# Patient Record
Sex: Female | Born: 1945 | Race: White | Hispanic: No | Marital: Married | Smoking: Current every day smoker
Health system: Southern US, Community
[De-identification: ages and names within clinical notes are randomized; demographics above are authoritative.]

## PROBLEM LIST (undated history)

## (undated) DIAGNOSIS — R918 Other nonspecific abnormal finding of lung field: Secondary | ICD-10-CM

## (undated) DIAGNOSIS — I10 Essential (primary) hypertension: Secondary | ICD-10-CM

## (undated) DIAGNOSIS — H409 Unspecified glaucoma: Secondary | ICD-10-CM

## (undated) DIAGNOSIS — E785 Hyperlipidemia, unspecified: Secondary | ICD-10-CM

## (undated) DIAGNOSIS — J449 Chronic obstructive pulmonary disease, unspecified: Secondary | ICD-10-CM

## (undated) DIAGNOSIS — S42302A Unspecified fracture of shaft of humerus, left arm, initial encounter for closed fracture: Secondary | ICD-10-CM

## (undated) HISTORY — DX: Chronic obstructive pulmonary disease, unspecified: J44.9

## (undated) HISTORY — DX: Hyperlipidemia, unspecified: E78.5

## (undated) HISTORY — DX: Other nonspecific abnormal finding of lung field: R91.8

## (undated) HISTORY — PX: EYE SURGERY: SHX253

## (undated) HISTORY — DX: Unspecified fracture of shaft of humerus, left arm, initial encounter for closed fracture: S42.302A

## (undated) HISTORY — PX: CARPAL TUNNEL RELEASE: SHX101

## (undated) HISTORY — DX: Unspecified glaucoma: H40.9

## (undated) HISTORY — DX: Essential (primary) hypertension: I10

## (undated) HISTORY — PX: BACK SURGERY: SHX140

---

## 2005-04-09 ENCOUNTER — Encounter: Admission: RE | Admit: 2005-04-09 | Discharge: 2005-04-09 | Payer: Self-pay | Admitting: Family Medicine

## 2005-12-06 ENCOUNTER — Ambulatory Visit: Payer: Self-pay | Admitting: Otolaryngology

## 2005-12-16 ENCOUNTER — Ambulatory Visit: Payer: Self-pay | Admitting: Otolaryngology

## 2006-03-01 ENCOUNTER — Ambulatory Visit: Payer: Self-pay | Admitting: Specialist

## 2006-07-19 ENCOUNTER — Ambulatory Visit: Payer: Self-pay | Admitting: Ophthalmology

## 2007-11-07 ENCOUNTER — Ambulatory Visit: Payer: Self-pay | Admitting: Oral Surgery

## 2009-05-05 ENCOUNTER — Ambulatory Visit: Payer: Self-pay | Admitting: Unknown Physician Specialty

## 2009-05-22 ENCOUNTER — Ambulatory Visit: Payer: Self-pay | Admitting: Unknown Physician Specialty

## 2009-05-27 ENCOUNTER — Ambulatory Visit: Payer: Self-pay | Admitting: Unknown Physician Specialty

## 2009-06-16 ENCOUNTER — Inpatient Hospital Stay: Payer: Self-pay | Admitting: Unknown Physician Specialty

## 2009-07-03 ENCOUNTER — Emergency Department: Payer: Self-pay | Admitting: Emergency Medicine

## 2009-07-06 DIAGNOSIS — E7800 Pure hypercholesterolemia, unspecified: Secondary | ICD-10-CM | POA: Insufficient documentation

## 2009-07-06 DIAGNOSIS — H409 Unspecified glaucoma: Secondary | ICD-10-CM | POA: Insufficient documentation

## 2009-07-06 DIAGNOSIS — J449 Chronic obstructive pulmonary disease, unspecified: Secondary | ICD-10-CM | POA: Insufficient documentation

## 2009-07-06 DIAGNOSIS — R32 Unspecified urinary incontinence: Secondary | ICD-10-CM | POA: Insufficient documentation

## 2009-07-06 DIAGNOSIS — I471 Supraventricular tachycardia: Secondary | ICD-10-CM | POA: Insufficient documentation

## 2009-07-06 DIAGNOSIS — K219 Gastro-esophageal reflux disease without esophagitis: Secondary | ICD-10-CM | POA: Insufficient documentation

## 2009-07-06 DIAGNOSIS — I10 Essential (primary) hypertension: Secondary | ICD-10-CM | POA: Insufficient documentation

## 2010-02-03 ENCOUNTER — Ambulatory Visit: Payer: Self-pay | Admitting: Family Medicine

## 2010-02-10 ENCOUNTER — Ambulatory Visit: Payer: Self-pay | Admitting: Family Medicine

## 2010-02-10 DIAGNOSIS — J309 Allergic rhinitis, unspecified: Secondary | ICD-10-CM | POA: Insufficient documentation

## 2010-07-22 ENCOUNTER — Ambulatory Visit: Payer: Self-pay | Admitting: Family Medicine

## 2010-11-08 ENCOUNTER — Ambulatory Visit: Payer: Self-pay | Admitting: Unknown Physician Specialty

## 2010-11-14 ENCOUNTER — Encounter: Payer: Self-pay | Admitting: Family Medicine

## 2010-11-30 ENCOUNTER — Ambulatory Visit: Payer: Self-pay | Admitting: Pain Medicine

## 2010-12-13 ENCOUNTER — Ambulatory Visit: Payer: Self-pay | Admitting: Pain Medicine

## 2010-12-15 ENCOUNTER — Ambulatory Visit: Payer: Self-pay | Admitting: Pain Medicine

## 2010-12-15 ENCOUNTER — Other Ambulatory Visit: Payer: Self-pay | Admitting: Pain Medicine

## 2011-01-11 ENCOUNTER — Ambulatory Visit: Payer: Self-pay | Admitting: Pain Medicine

## 2011-01-25 ENCOUNTER — Ambulatory Visit: Payer: Self-pay | Admitting: Pain Medicine

## 2011-02-09 ENCOUNTER — Ambulatory Visit: Payer: Self-pay | Admitting: Gastroenterology

## 2011-02-13 LAB — PATHOLOGY REPORT

## 2011-02-14 ENCOUNTER — Ambulatory Visit: Payer: Self-pay | Admitting: Pain Medicine

## 2011-04-20 ENCOUNTER — Ambulatory Visit: Payer: Self-pay | Admitting: Rheumatology

## 2011-11-15 DIAGNOSIS — E7800 Pure hypercholesterolemia, unspecified: Secondary | ICD-10-CM | POA: Diagnosis not present

## 2011-11-15 DIAGNOSIS — J449 Chronic obstructive pulmonary disease, unspecified: Secondary | ICD-10-CM | POA: Diagnosis not present

## 2011-11-15 DIAGNOSIS — M949 Disorder of cartilage, unspecified: Secondary | ICD-10-CM | POA: Diagnosis not present

## 2011-11-15 DIAGNOSIS — I10 Essential (primary) hypertension: Secondary | ICD-10-CM | POA: Diagnosis not present

## 2011-11-15 DIAGNOSIS — M899 Disorder of bone, unspecified: Secondary | ICD-10-CM | POA: Diagnosis not present

## 2011-12-05 DIAGNOSIS — M545 Low back pain, unspecified: Secondary | ICD-10-CM | POA: Diagnosis not present

## 2011-12-19 DIAGNOSIS — H4010X0 Unspecified open-angle glaucoma, stage unspecified: Secondary | ICD-10-CM | POA: Diagnosis not present

## 2012-01-18 DIAGNOSIS — S52209A Unspecified fracture of shaft of unspecified ulna, initial encounter for closed fracture: Secondary | ICD-10-CM | POA: Diagnosis not present

## 2012-01-18 DIAGNOSIS — S52309A Unspecified fracture of shaft of unspecified radius, initial encounter for closed fracture: Secondary | ICD-10-CM | POA: Diagnosis not present

## 2012-01-27 DIAGNOSIS — S52209A Unspecified fracture of shaft of unspecified ulna, initial encounter for closed fracture: Secondary | ICD-10-CM | POA: Diagnosis not present

## 2012-01-27 DIAGNOSIS — I10 Essential (primary) hypertension: Secondary | ICD-10-CM | POA: Diagnosis not present

## 2012-01-27 DIAGNOSIS — E7800 Pure hypercholesterolemia, unspecified: Secondary | ICD-10-CM | POA: Diagnosis not present

## 2012-02-06 DIAGNOSIS — S52309A Unspecified fracture of shaft of unspecified radius, initial encounter for closed fracture: Secondary | ICD-10-CM | POA: Diagnosis not present

## 2012-02-06 DIAGNOSIS — S52209A Unspecified fracture of shaft of unspecified ulna, initial encounter for closed fracture: Secondary | ICD-10-CM | POA: Diagnosis not present

## 2012-02-27 DIAGNOSIS — S52309A Unspecified fracture of shaft of unspecified radius, initial encounter for closed fracture: Secondary | ICD-10-CM | POA: Diagnosis not present

## 2012-02-27 DIAGNOSIS — S52209A Unspecified fracture of shaft of unspecified ulna, initial encounter for closed fracture: Secondary | ICD-10-CM | POA: Diagnosis not present

## 2012-03-12 DIAGNOSIS — S52209A Unspecified fracture of shaft of unspecified ulna, initial encounter for closed fracture: Secondary | ICD-10-CM | POA: Diagnosis not present

## 2012-03-12 DIAGNOSIS — S52309A Unspecified fracture of shaft of unspecified radius, initial encounter for closed fracture: Secondary | ICD-10-CM | POA: Diagnosis not present

## 2012-03-20 DIAGNOSIS — S52209A Unspecified fracture of shaft of unspecified ulna, initial encounter for closed fracture: Secondary | ICD-10-CM | POA: Diagnosis not present

## 2012-03-20 DIAGNOSIS — S52309A Unspecified fracture of shaft of unspecified radius, initial encounter for closed fracture: Secondary | ICD-10-CM | POA: Diagnosis not present

## 2012-03-22 DIAGNOSIS — H169 Unspecified keratitis: Secondary | ICD-10-CM | POA: Diagnosis not present

## 2012-03-29 DIAGNOSIS — H169 Unspecified keratitis: Secondary | ICD-10-CM | POA: Diagnosis not present

## 2012-04-16 DIAGNOSIS — I10 Essential (primary) hypertension: Secondary | ICD-10-CM | POA: Diagnosis not present

## 2012-04-16 DIAGNOSIS — M545 Low back pain, unspecified: Secondary | ICD-10-CM | POA: Diagnosis not present

## 2012-04-16 DIAGNOSIS — J449 Chronic obstructive pulmonary disease, unspecified: Secondary | ICD-10-CM | POA: Diagnosis not present

## 2012-04-16 DIAGNOSIS — E7800 Pure hypercholesterolemia, unspecified: Secondary | ICD-10-CM | POA: Diagnosis not present

## 2012-04-20 DIAGNOSIS — H4010X0 Unspecified open-angle glaucoma, stage unspecified: Secondary | ICD-10-CM | POA: Diagnosis not present

## 2012-04-30 DIAGNOSIS — S52309A Unspecified fracture of shaft of unspecified radius, initial encounter for closed fracture: Secondary | ICD-10-CM | POA: Diagnosis not present

## 2012-04-30 DIAGNOSIS — S52209A Unspecified fracture of shaft of unspecified ulna, initial encounter for closed fracture: Secondary | ICD-10-CM | POA: Diagnosis not present

## 2012-05-01 ENCOUNTER — Ambulatory Visit: Payer: Self-pay | Admitting: Pain Medicine

## 2012-05-01 DIAGNOSIS — M79609 Pain in unspecified limb: Secondary | ICD-10-CM | POA: Diagnosis not present

## 2012-05-01 DIAGNOSIS — M545 Low back pain, unspecified: Secondary | ICD-10-CM | POA: Diagnosis not present

## 2012-05-01 DIAGNOSIS — G4733 Obstructive sleep apnea (adult) (pediatric): Secondary | ICD-10-CM | POA: Diagnosis not present

## 2012-05-01 DIAGNOSIS — I10 Essential (primary) hypertension: Secondary | ICD-10-CM | POA: Diagnosis not present

## 2012-05-01 DIAGNOSIS — F17200 Nicotine dependence, unspecified, uncomplicated: Secondary | ICD-10-CM | POA: Diagnosis not present

## 2012-05-01 DIAGNOSIS — M5137 Other intervertebral disc degeneration, lumbosacral region: Secondary | ICD-10-CM | POA: Diagnosis not present

## 2012-05-01 DIAGNOSIS — Z7982 Long term (current) use of aspirin: Secondary | ICD-10-CM | POA: Diagnosis not present

## 2012-05-01 DIAGNOSIS — M25559 Pain in unspecified hip: Secondary | ICD-10-CM | POA: Diagnosis not present

## 2012-05-01 DIAGNOSIS — M169 Osteoarthritis of hip, unspecified: Secondary | ICD-10-CM | POA: Diagnosis not present

## 2012-05-01 DIAGNOSIS — K219 Gastro-esophageal reflux disease without esophagitis: Secondary | ICD-10-CM | POA: Diagnosis not present

## 2012-05-01 DIAGNOSIS — IMO0002 Reserved for concepts with insufficient information to code with codable children: Secondary | ICD-10-CM | POA: Diagnosis not present

## 2012-05-01 DIAGNOSIS — M47817 Spondylosis without myelopathy or radiculopathy, lumbosacral region: Secondary | ICD-10-CM | POA: Diagnosis not present

## 2012-05-01 DIAGNOSIS — Z79899 Other long term (current) drug therapy: Secondary | ICD-10-CM | POA: Diagnosis not present

## 2012-05-01 DIAGNOSIS — E669 Obesity, unspecified: Secondary | ICD-10-CM | POA: Diagnosis not present

## 2012-05-01 DIAGNOSIS — G5790 Unspecified mononeuropathy of unspecified lower limb: Secondary | ICD-10-CM | POA: Diagnosis not present

## 2012-05-01 DIAGNOSIS — I209 Angina pectoris, unspecified: Secondary | ICD-10-CM | POA: Diagnosis not present

## 2012-05-01 DIAGNOSIS — J449 Chronic obstructive pulmonary disease, unspecified: Secondary | ICD-10-CM | POA: Diagnosis not present

## 2012-05-01 DIAGNOSIS — I2510 Atherosclerotic heart disease of native coronary artery without angina pectoris: Secondary | ICD-10-CM | POA: Diagnosis not present

## 2012-05-01 DIAGNOSIS — IMO0001 Reserved for inherently not codable concepts without codable children: Secondary | ICD-10-CM | POA: Diagnosis not present

## 2012-05-08 ENCOUNTER — Ambulatory Visit: Payer: Self-pay | Admitting: Pain Medicine

## 2012-05-08 DIAGNOSIS — M47817 Spondylosis without myelopathy or radiculopathy, lumbosacral region: Secondary | ICD-10-CM | POA: Diagnosis not present

## 2012-05-08 DIAGNOSIS — IMO0002 Reserved for concepts with insufficient information to code with codable children: Secondary | ICD-10-CM | POA: Diagnosis not present

## 2012-05-09 ENCOUNTER — Other Ambulatory Visit: Payer: Self-pay | Admitting: Pain Medicine

## 2012-05-09 DIAGNOSIS — IMO0001 Reserved for inherently not codable concepts without codable children: Secondary | ICD-10-CM | POA: Diagnosis not present

## 2012-05-09 DIAGNOSIS — M79609 Pain in unspecified limb: Secondary | ICD-10-CM | POA: Diagnosis not present

## 2012-05-09 DIAGNOSIS — G609 Hereditary and idiopathic neuropathy, unspecified: Secondary | ICD-10-CM | POA: Diagnosis not present

## 2012-05-09 DIAGNOSIS — R52 Pain, unspecified: Secondary | ICD-10-CM | POA: Diagnosis not present

## 2012-05-09 DIAGNOSIS — M549 Dorsalgia, unspecified: Secondary | ICD-10-CM | POA: Diagnosis not present

## 2012-05-09 LAB — FOLATE: Folic Acid: 23.4 ng/mL (ref 3.1–100.0)

## 2012-05-24 DIAGNOSIS — F4542 Pain disorder with related psychological factors: Secondary | ICD-10-CM | POA: Diagnosis not present

## 2012-05-28 ENCOUNTER — Ambulatory Visit: Payer: Self-pay | Admitting: Pain Medicine

## 2012-05-28 DIAGNOSIS — M79609 Pain in unspecified limb: Secondary | ICD-10-CM | POA: Diagnosis not present

## 2012-05-28 DIAGNOSIS — IMO0002 Reserved for concepts with insufficient information to code with codable children: Secondary | ICD-10-CM | POA: Diagnosis not present

## 2012-05-28 DIAGNOSIS — M47817 Spondylosis without myelopathy or radiculopathy, lumbosacral region: Secondary | ICD-10-CM | POA: Diagnosis not present

## 2012-05-28 DIAGNOSIS — M545 Low back pain, unspecified: Secondary | ICD-10-CM | POA: Diagnosis not present

## 2012-05-28 DIAGNOSIS — M25559 Pain in unspecified hip: Secondary | ICD-10-CM | POA: Diagnosis not present

## 2012-05-28 DIAGNOSIS — M169 Osteoarthritis of hip, unspecified: Secondary | ICD-10-CM | POA: Diagnosis not present

## 2012-05-28 DIAGNOSIS — IMO0001 Reserved for inherently not codable concepts without codable children: Secondary | ICD-10-CM | POA: Diagnosis not present

## 2012-05-28 DIAGNOSIS — Z79899 Other long term (current) drug therapy: Secondary | ICD-10-CM | POA: Diagnosis not present

## 2012-05-28 DIAGNOSIS — G8929 Other chronic pain: Secondary | ICD-10-CM | POA: Diagnosis not present

## 2012-05-28 DIAGNOSIS — M961 Postlaminectomy syndrome, not elsewhere classified: Secondary | ICD-10-CM | POA: Diagnosis not present

## 2012-05-31 ENCOUNTER — Ambulatory Visit: Payer: Self-pay | Admitting: Pain Medicine

## 2012-05-31 DIAGNOSIS — M79609 Pain in unspecified limb: Secondary | ICD-10-CM | POA: Diagnosis not present

## 2012-05-31 DIAGNOSIS — M961 Postlaminectomy syndrome, not elsewhere classified: Secondary | ICD-10-CM | POA: Diagnosis not present

## 2012-05-31 DIAGNOSIS — IMO0002 Reserved for concepts with insufficient information to code with codable children: Secondary | ICD-10-CM | POA: Diagnosis not present

## 2012-07-02 ENCOUNTER — Ambulatory Visit: Payer: Self-pay | Admitting: Pain Medicine

## 2012-07-02 DIAGNOSIS — M47817 Spondylosis without myelopathy or radiculopathy, lumbosacral region: Secondary | ICD-10-CM | POA: Diagnosis not present

## 2012-07-02 DIAGNOSIS — S52309A Unspecified fracture of shaft of unspecified radius, initial encounter for closed fracture: Secondary | ICD-10-CM | POA: Diagnosis not present

## 2012-07-02 DIAGNOSIS — M79609 Pain in unspecified limb: Secondary | ICD-10-CM | POA: Diagnosis not present

## 2012-07-02 DIAGNOSIS — IMO0002 Reserved for concepts with insufficient information to code with codable children: Secondary | ICD-10-CM | POA: Diagnosis not present

## 2012-07-02 DIAGNOSIS — S52209A Unspecified fracture of shaft of unspecified ulna, initial encounter for closed fracture: Secondary | ICD-10-CM | POA: Diagnosis not present

## 2012-07-02 DIAGNOSIS — M4716 Other spondylosis with myelopathy, lumbar region: Secondary | ICD-10-CM | POA: Diagnosis not present

## 2012-07-02 DIAGNOSIS — M25579 Pain in unspecified ankle and joints of unspecified foot: Secondary | ICD-10-CM | POA: Diagnosis not present

## 2012-07-02 DIAGNOSIS — M545 Low back pain, unspecified: Secondary | ICD-10-CM | POA: Diagnosis not present

## 2012-07-02 DIAGNOSIS — M169 Osteoarthritis of hip, unspecified: Secondary | ICD-10-CM | POA: Diagnosis not present

## 2012-07-02 DIAGNOSIS — Z79899 Other long term (current) drug therapy: Secondary | ICD-10-CM | POA: Diagnosis not present

## 2012-07-02 DIAGNOSIS — IMO0001 Reserved for inherently not codable concepts without codable children: Secondary | ICD-10-CM | POA: Diagnosis not present

## 2012-07-02 DIAGNOSIS — M25559 Pain in unspecified hip: Secondary | ICD-10-CM | POA: Diagnosis not present

## 2012-07-02 DIAGNOSIS — S8990XA Unspecified injury of unspecified lower leg, initial encounter: Secondary | ICD-10-CM | POA: Diagnosis not present

## 2012-07-30 ENCOUNTER — Ambulatory Visit: Payer: Self-pay | Admitting: Pain Medicine

## 2012-07-30 DIAGNOSIS — G8929 Other chronic pain: Secondary | ICD-10-CM | POA: Diagnosis not present

## 2012-07-30 DIAGNOSIS — M961 Postlaminectomy syndrome, not elsewhere classified: Secondary | ICD-10-CM | POA: Diagnosis not present

## 2012-07-30 DIAGNOSIS — M79609 Pain in unspecified limb: Secondary | ICD-10-CM | POA: Diagnosis not present

## 2012-07-30 DIAGNOSIS — M47817 Spondylosis without myelopathy or radiculopathy, lumbosacral region: Secondary | ICD-10-CM | POA: Diagnosis not present

## 2012-07-30 DIAGNOSIS — M25569 Pain in unspecified knee: Secondary | ICD-10-CM | POA: Diagnosis not present

## 2012-07-30 DIAGNOSIS — Z79899 Other long term (current) drug therapy: Secondary | ICD-10-CM | POA: Diagnosis not present

## 2012-07-30 DIAGNOSIS — R109 Unspecified abdominal pain: Secondary | ICD-10-CM | POA: Diagnosis not present

## 2012-07-30 DIAGNOSIS — M545 Low back pain, unspecified: Secondary | ICD-10-CM | POA: Diagnosis not present

## 2012-07-30 DIAGNOSIS — IMO0002 Reserved for concepts with insufficient information to code with codable children: Secondary | ICD-10-CM | POA: Diagnosis not present

## 2012-07-30 DIAGNOSIS — M169 Osteoarthritis of hip, unspecified: Secondary | ICD-10-CM | POA: Diagnosis not present

## 2012-07-30 DIAGNOSIS — M4716 Other spondylosis with myelopathy, lumbar region: Secondary | ICD-10-CM | POA: Diagnosis not present

## 2012-07-30 DIAGNOSIS — IMO0001 Reserved for inherently not codable concepts without codable children: Secondary | ICD-10-CM | POA: Diagnosis not present

## 2012-08-27 ENCOUNTER — Ambulatory Visit: Payer: Self-pay | Admitting: Pain Medicine

## 2012-08-27 DIAGNOSIS — M169 Osteoarthritis of hip, unspecified: Secondary | ICD-10-CM | POA: Diagnosis not present

## 2012-08-27 DIAGNOSIS — M25559 Pain in unspecified hip: Secondary | ICD-10-CM | POA: Diagnosis not present

## 2012-08-27 DIAGNOSIS — G8929 Other chronic pain: Secondary | ICD-10-CM | POA: Diagnosis not present

## 2012-08-27 DIAGNOSIS — M545 Low back pain, unspecified: Secondary | ICD-10-CM | POA: Diagnosis not present

## 2012-08-27 DIAGNOSIS — R109 Unspecified abdominal pain: Secondary | ICD-10-CM | POA: Diagnosis not present

## 2012-08-27 DIAGNOSIS — Z79899 Other long term (current) drug therapy: Secondary | ICD-10-CM | POA: Diagnosis not present

## 2012-08-27 DIAGNOSIS — IMO0002 Reserved for concepts with insufficient information to code with codable children: Secondary | ICD-10-CM | POA: Diagnosis not present

## 2012-08-27 DIAGNOSIS — M47817 Spondylosis without myelopathy or radiculopathy, lumbosacral region: Secondary | ICD-10-CM | POA: Diagnosis not present

## 2012-08-27 DIAGNOSIS — M4716 Other spondylosis with myelopathy, lumbar region: Secondary | ICD-10-CM | POA: Diagnosis not present

## 2012-08-27 DIAGNOSIS — IMO0001 Reserved for inherently not codable concepts without codable children: Secondary | ICD-10-CM | POA: Diagnosis not present

## 2012-09-03 DIAGNOSIS — E7800 Pure hypercholesterolemia, unspecified: Secondary | ICD-10-CM | POA: Diagnosis not present

## 2012-09-03 DIAGNOSIS — G608 Other hereditary and idiopathic neuropathies: Secondary | ICD-10-CM | POA: Diagnosis not present

## 2012-09-03 DIAGNOSIS — Z113 Encounter for screening for infections with a predominantly sexual mode of transmission: Secondary | ICD-10-CM | POA: Diagnosis not present

## 2012-09-03 DIAGNOSIS — Z0000 Encounter for general adult medical examination without abnormal findings: Secondary | ICD-10-CM | POA: Diagnosis not present

## 2012-09-03 DIAGNOSIS — I10 Essential (primary) hypertension: Secondary | ICD-10-CM | POA: Diagnosis not present

## 2012-09-03 LAB — HM HEPATITIS C SCREENING LAB: HM Hepatitis Screen: NEGATIVE

## 2012-10-11 DIAGNOSIS — H4010X0 Unspecified open-angle glaucoma, stage unspecified: Secondary | ICD-10-CM | POA: Diagnosis not present

## 2012-10-31 DIAGNOSIS — M19049 Primary osteoarthritis, unspecified hand: Secondary | ICD-10-CM | POA: Diagnosis not present

## 2012-10-31 DIAGNOSIS — IMO0002 Reserved for concepts with insufficient information to code with codable children: Secondary | ICD-10-CM | POA: Diagnosis not present

## 2012-11-14 ENCOUNTER — Ambulatory Visit: Payer: Self-pay | Admitting: Pain Medicine

## 2012-11-14 DIAGNOSIS — IMO0001 Reserved for inherently not codable concepts without codable children: Secondary | ICD-10-CM | POA: Diagnosis not present

## 2012-11-14 DIAGNOSIS — F17200 Nicotine dependence, unspecified, uncomplicated: Secondary | ICD-10-CM | POA: Diagnosis not present

## 2012-11-14 DIAGNOSIS — I209 Angina pectoris, unspecified: Secondary | ICD-10-CM | POA: Diagnosis not present

## 2012-11-14 DIAGNOSIS — Z79899 Other long term (current) drug therapy: Secondary | ICD-10-CM | POA: Diagnosis not present

## 2012-11-14 DIAGNOSIS — G4733 Obstructive sleep apnea (adult) (pediatric): Secondary | ICD-10-CM | POA: Diagnosis not present

## 2012-11-14 DIAGNOSIS — M545 Low back pain, unspecified: Secondary | ICD-10-CM | POA: Diagnosis not present

## 2012-11-14 DIAGNOSIS — M961 Postlaminectomy syndrome, not elsewhere classified: Secondary | ICD-10-CM | POA: Diagnosis not present

## 2012-11-14 DIAGNOSIS — M47817 Spondylosis without myelopathy or radiculopathy, lumbosacral region: Secondary | ICD-10-CM | POA: Diagnosis not present

## 2012-11-14 DIAGNOSIS — I10 Essential (primary) hypertension: Secondary | ICD-10-CM | POA: Diagnosis not present

## 2012-11-14 DIAGNOSIS — M79609 Pain in unspecified limb: Secondary | ICD-10-CM | POA: Diagnosis not present

## 2012-11-14 DIAGNOSIS — G894 Chronic pain syndrome: Secondary | ICD-10-CM | POA: Diagnosis not present

## 2012-11-14 DIAGNOSIS — K219 Gastro-esophageal reflux disease without esophagitis: Secondary | ICD-10-CM | POA: Diagnosis not present

## 2012-11-14 DIAGNOSIS — M25559 Pain in unspecified hip: Secondary | ICD-10-CM | POA: Diagnosis not present

## 2012-11-14 DIAGNOSIS — IMO0002 Reserved for concepts with insufficient information to code with codable children: Secondary | ICD-10-CM | POA: Diagnosis not present

## 2012-11-14 DIAGNOSIS — M48061 Spinal stenosis, lumbar region without neurogenic claudication: Secondary | ICD-10-CM | POA: Diagnosis not present

## 2013-01-24 ENCOUNTER — Ambulatory Visit: Payer: Self-pay | Admitting: Pain Medicine

## 2013-01-24 DIAGNOSIS — G4733 Obstructive sleep apnea (adult) (pediatric): Secondary | ICD-10-CM | POA: Diagnosis not present

## 2013-01-24 DIAGNOSIS — M5137 Other intervertebral disc degeneration, lumbosacral region: Secondary | ICD-10-CM | POA: Diagnosis not present

## 2013-01-24 DIAGNOSIS — K219 Gastro-esophageal reflux disease without esophagitis: Secondary | ICD-10-CM | POA: Diagnosis not present

## 2013-01-24 DIAGNOSIS — F17200 Nicotine dependence, unspecified, uncomplicated: Secondary | ICD-10-CM | POA: Diagnosis not present

## 2013-01-24 DIAGNOSIS — I2510 Atherosclerotic heart disease of native coronary artery without angina pectoris: Secondary | ICD-10-CM | POA: Diagnosis not present

## 2013-01-24 DIAGNOSIS — I10 Essential (primary) hypertension: Secondary | ICD-10-CM | POA: Diagnosis not present

## 2013-01-24 DIAGNOSIS — M545 Low back pain, unspecified: Secondary | ICD-10-CM | POA: Diagnosis not present

## 2013-01-24 DIAGNOSIS — M961 Postlaminectomy syndrome, not elsewhere classified: Secondary | ICD-10-CM | POA: Diagnosis not present

## 2013-01-24 DIAGNOSIS — IMO0001 Reserved for inherently not codable concepts without codable children: Secondary | ICD-10-CM | POA: Diagnosis not present

## 2013-01-24 DIAGNOSIS — Z79899 Other long term (current) drug therapy: Secondary | ICD-10-CM | POA: Diagnosis not present

## 2013-01-24 DIAGNOSIS — M4716 Other spondylosis with myelopathy, lumbar region: Secondary | ICD-10-CM | POA: Diagnosis not present

## 2013-01-24 DIAGNOSIS — M47817 Spondylosis without myelopathy or radiculopathy, lumbosacral region: Secondary | ICD-10-CM | POA: Diagnosis not present

## 2013-01-24 DIAGNOSIS — M169 Osteoarthritis of hip, unspecified: Secondary | ICD-10-CM | POA: Diagnosis not present

## 2013-01-24 DIAGNOSIS — G8929 Other chronic pain: Secondary | ICD-10-CM | POA: Diagnosis not present

## 2013-01-24 DIAGNOSIS — J438 Other emphysema: Secondary | ICD-10-CM | POA: Diagnosis not present

## 2013-01-24 DIAGNOSIS — IMO0002 Reserved for concepts with insufficient information to code with codable children: Secondary | ICD-10-CM | POA: Diagnosis not present

## 2013-05-24 ENCOUNTER — Ambulatory Visit: Payer: Self-pay | Admitting: Pain Medicine

## 2013-05-24 DIAGNOSIS — M5137 Other intervertebral disc degeneration, lumbosacral region: Secondary | ICD-10-CM | POA: Diagnosis not present

## 2013-05-24 DIAGNOSIS — K219 Gastro-esophageal reflux disease without esophagitis: Secondary | ICD-10-CM | POA: Diagnosis not present

## 2013-05-24 DIAGNOSIS — Z79899 Other long term (current) drug therapy: Secondary | ICD-10-CM | POA: Diagnosis not present

## 2013-05-24 DIAGNOSIS — M47817 Spondylosis without myelopathy or radiculopathy, lumbosacral region: Secondary | ICD-10-CM | POA: Diagnosis not present

## 2013-05-24 DIAGNOSIS — J438 Other emphysema: Secondary | ICD-10-CM | POA: Diagnosis not present

## 2013-05-24 DIAGNOSIS — IMO0001 Reserved for inherently not codable concepts without codable children: Secondary | ICD-10-CM | POA: Diagnosis not present

## 2013-05-24 DIAGNOSIS — G4733 Obstructive sleep apnea (adult) (pediatric): Secondary | ICD-10-CM | POA: Diagnosis not present

## 2013-05-24 DIAGNOSIS — G8929 Other chronic pain: Secondary | ICD-10-CM | POA: Diagnosis not present

## 2013-05-24 DIAGNOSIS — M545 Low back pain, unspecified: Secondary | ICD-10-CM | POA: Diagnosis not present

## 2013-05-24 DIAGNOSIS — M169 Osteoarthritis of hip, unspecified: Secondary | ICD-10-CM | POA: Diagnosis not present

## 2013-05-24 DIAGNOSIS — E669 Obesity, unspecified: Secondary | ICD-10-CM | POA: Diagnosis not present

## 2013-05-24 DIAGNOSIS — I10 Essential (primary) hypertension: Secondary | ICD-10-CM | POA: Diagnosis not present

## 2013-05-24 DIAGNOSIS — IMO0002 Reserved for concepts with insufficient information to code with codable children: Secondary | ICD-10-CM | POA: Diagnosis not present

## 2013-05-24 DIAGNOSIS — G894 Chronic pain syndrome: Secondary | ICD-10-CM | POA: Diagnosis not present

## 2013-05-24 DIAGNOSIS — M4716 Other spondylosis with myelopathy, lumbar region: Secondary | ICD-10-CM | POA: Diagnosis not present

## 2013-05-24 DIAGNOSIS — I209 Angina pectoris, unspecified: Secondary | ICD-10-CM | POA: Diagnosis not present

## 2013-05-24 DIAGNOSIS — I2510 Atherosclerotic heart disease of native coronary artery without angina pectoris: Secondary | ICD-10-CM | POA: Diagnosis not present

## 2013-05-24 DIAGNOSIS — F17200 Nicotine dependence, unspecified, uncomplicated: Secondary | ICD-10-CM | POA: Diagnosis not present

## 2013-06-06 DIAGNOSIS — H4010X0 Unspecified open-angle glaucoma, stage unspecified: Secondary | ICD-10-CM | POA: Diagnosis not present

## 2013-07-24 ENCOUNTER — Ambulatory Visit: Payer: Self-pay | Admitting: Pain Medicine

## 2013-07-24 DIAGNOSIS — I2510 Atherosclerotic heart disease of native coronary artery without angina pectoris: Secondary | ICD-10-CM | POA: Diagnosis not present

## 2013-07-24 DIAGNOSIS — G8929 Other chronic pain: Secondary | ICD-10-CM | POA: Diagnosis not present

## 2013-07-24 DIAGNOSIS — M545 Low back pain, unspecified: Secondary | ICD-10-CM | POA: Diagnosis not present

## 2013-07-24 DIAGNOSIS — K219 Gastro-esophageal reflux disease without esophagitis: Secondary | ICD-10-CM | POA: Diagnosis not present

## 2013-07-24 DIAGNOSIS — M4716 Other spondylosis with myelopathy, lumbar region: Secondary | ICD-10-CM | POA: Diagnosis not present

## 2013-07-24 DIAGNOSIS — E669 Obesity, unspecified: Secondary | ICD-10-CM | POA: Diagnosis not present

## 2013-07-24 DIAGNOSIS — IMO0002 Reserved for concepts with insufficient information to code with codable children: Secondary | ICD-10-CM | POA: Diagnosis not present

## 2013-07-24 DIAGNOSIS — Z79899 Other long term (current) drug therapy: Secondary | ICD-10-CM | POA: Diagnosis not present

## 2013-07-24 DIAGNOSIS — IMO0001 Reserved for inherently not codable concepts without codable children: Secondary | ICD-10-CM | POA: Diagnosis not present

## 2013-07-24 DIAGNOSIS — F17200 Nicotine dependence, unspecified, uncomplicated: Secondary | ICD-10-CM | POA: Diagnosis not present

## 2013-07-24 DIAGNOSIS — M47817 Spondylosis without myelopathy or radiculopathy, lumbosacral region: Secondary | ICD-10-CM | POA: Diagnosis not present

## 2013-07-24 DIAGNOSIS — I209 Angina pectoris, unspecified: Secondary | ICD-10-CM | POA: Diagnosis not present

## 2013-07-24 DIAGNOSIS — I10 Essential (primary) hypertension: Secondary | ICD-10-CM | POA: Diagnosis not present

## 2013-07-24 DIAGNOSIS — M5137 Other intervertebral disc degeneration, lumbosacral region: Secondary | ICD-10-CM | POA: Diagnosis not present

## 2013-07-24 DIAGNOSIS — J438 Other emphysema: Secondary | ICD-10-CM | POA: Diagnosis not present

## 2013-07-24 DIAGNOSIS — M169 Osteoarthritis of hip, unspecified: Secondary | ICD-10-CM | POA: Diagnosis not present

## 2013-07-24 DIAGNOSIS — M961 Postlaminectomy syndrome, not elsewhere classified: Secondary | ICD-10-CM | POA: Diagnosis not present

## 2013-08-01 DIAGNOSIS — Z23 Encounter for immunization: Secondary | ICD-10-CM | POA: Diagnosis not present

## 2013-12-05 DIAGNOSIS — H4010X0 Unspecified open-angle glaucoma, stage unspecified: Secondary | ICD-10-CM | POA: Diagnosis not present

## 2013-12-13 ENCOUNTER — Ambulatory Visit: Payer: Self-pay | Admitting: Pain Medicine

## 2013-12-13 DIAGNOSIS — M545 Low back pain, unspecified: Secondary | ICD-10-CM | POA: Diagnosis not present

## 2013-12-13 DIAGNOSIS — Z79899 Other long term (current) drug therapy: Secondary | ICD-10-CM | POA: Diagnosis not present

## 2013-12-13 DIAGNOSIS — I209 Angina pectoris, unspecified: Secondary | ICD-10-CM | POA: Diagnosis not present

## 2013-12-13 DIAGNOSIS — Z5181 Encounter for therapeutic drug level monitoring: Secondary | ICD-10-CM | POA: Diagnosis not present

## 2013-12-13 DIAGNOSIS — K219 Gastro-esophageal reflux disease without esophagitis: Secondary | ICD-10-CM | POA: Diagnosis not present

## 2013-12-13 DIAGNOSIS — M47817 Spondylosis without myelopathy or radiculopathy, lumbosacral region: Secondary | ICD-10-CM | POA: Diagnosis not present

## 2013-12-13 DIAGNOSIS — F17200 Nicotine dependence, unspecified, uncomplicated: Secondary | ICD-10-CM | POA: Diagnosis not present

## 2013-12-13 DIAGNOSIS — M4716 Other spondylosis with myelopathy, lumbar region: Secondary | ICD-10-CM | POA: Diagnosis not present

## 2013-12-13 DIAGNOSIS — E669 Obesity, unspecified: Secondary | ICD-10-CM | POA: Diagnosis not present

## 2013-12-13 DIAGNOSIS — M1610 Unilateral primary osteoarthritis, unspecified hip: Secondary | ICD-10-CM | POA: Diagnosis not present

## 2013-12-13 DIAGNOSIS — IMO0002 Reserved for concepts with insufficient information to code with codable children: Secondary | ICD-10-CM | POA: Diagnosis not present

## 2013-12-13 DIAGNOSIS — M5137 Other intervertebral disc degeneration, lumbosacral region: Secondary | ICD-10-CM | POA: Diagnosis not present

## 2013-12-13 DIAGNOSIS — I2510 Atherosclerotic heart disease of native coronary artery without angina pectoris: Secondary | ICD-10-CM | POA: Diagnosis not present

## 2013-12-13 DIAGNOSIS — G894 Chronic pain syndrome: Secondary | ICD-10-CM | POA: Diagnosis not present

## 2013-12-13 DIAGNOSIS — M169 Osteoarthritis of hip, unspecified: Secondary | ICD-10-CM | POA: Diagnosis not present

## 2013-12-13 DIAGNOSIS — IMO0001 Reserved for inherently not codable concepts without codable children: Secondary | ICD-10-CM | POA: Diagnosis not present

## 2013-12-13 DIAGNOSIS — G8929 Other chronic pain: Secondary | ICD-10-CM | POA: Diagnosis not present

## 2013-12-13 DIAGNOSIS — M961 Postlaminectomy syndrome, not elsewhere classified: Secondary | ICD-10-CM | POA: Diagnosis not present

## 2013-12-13 DIAGNOSIS — I10 Essential (primary) hypertension: Secondary | ICD-10-CM | POA: Diagnosis not present

## 2013-12-13 DIAGNOSIS — J438 Other emphysema: Secondary | ICD-10-CM | POA: Diagnosis not present

## 2013-12-13 DIAGNOSIS — G4733 Obstructive sleep apnea (adult) (pediatric): Secondary | ICD-10-CM | POA: Diagnosis not present

## 2014-01-09 DIAGNOSIS — Z124 Encounter for screening for malignant neoplasm of cervix: Secondary | ICD-10-CM | POA: Diagnosis not present

## 2014-03-07 ENCOUNTER — Ambulatory Visit: Payer: Self-pay | Admitting: Pain Medicine

## 2014-03-07 DIAGNOSIS — M545 Low back pain, unspecified: Secondary | ICD-10-CM | POA: Diagnosis not present

## 2014-03-07 DIAGNOSIS — IMO0002 Reserved for concepts with insufficient information to code with codable children: Secondary | ICD-10-CM | POA: Diagnosis not present

## 2014-03-07 DIAGNOSIS — Z79899 Other long term (current) drug therapy: Secondary | ICD-10-CM | POA: Diagnosis not present

## 2014-03-07 DIAGNOSIS — M47817 Spondylosis without myelopathy or radiculopathy, lumbosacral region: Secondary | ICD-10-CM | POA: Diagnosis not present

## 2014-03-07 DIAGNOSIS — E669 Obesity, unspecified: Secondary | ICD-10-CM | POA: Diagnosis not present

## 2014-03-07 DIAGNOSIS — M5137 Other intervertebral disc degeneration, lumbosacral region: Secondary | ICD-10-CM | POA: Diagnosis not present

## 2014-03-07 DIAGNOSIS — M4716 Other spondylosis with myelopathy, lumbar region: Secondary | ICD-10-CM | POA: Diagnosis not present

## 2014-03-07 DIAGNOSIS — G4733 Obstructive sleep apnea (adult) (pediatric): Secondary | ICD-10-CM | POA: Diagnosis not present

## 2014-03-07 DIAGNOSIS — IMO0001 Reserved for inherently not codable concepts without codable children: Secondary | ICD-10-CM | POA: Diagnosis not present

## 2014-03-07 DIAGNOSIS — K219 Gastro-esophageal reflux disease without esophagitis: Secondary | ICD-10-CM | POA: Diagnosis not present

## 2014-03-07 DIAGNOSIS — I10 Essential (primary) hypertension: Secondary | ICD-10-CM | POA: Diagnosis not present

## 2014-03-07 DIAGNOSIS — G8929 Other chronic pain: Secondary | ICD-10-CM | POA: Diagnosis not present

## 2014-03-07 DIAGNOSIS — F17200 Nicotine dependence, unspecified, uncomplicated: Secondary | ICD-10-CM | POA: Diagnosis not present

## 2014-03-07 DIAGNOSIS — M169 Osteoarthritis of hip, unspecified: Secondary | ICD-10-CM | POA: Diagnosis not present

## 2014-03-07 DIAGNOSIS — I209 Angina pectoris, unspecified: Secondary | ICD-10-CM | POA: Diagnosis not present

## 2014-03-07 DIAGNOSIS — I2510 Atherosclerotic heart disease of native coronary artery without angina pectoris: Secondary | ICD-10-CM | POA: Diagnosis not present

## 2014-03-07 DIAGNOSIS — M1610 Unilateral primary osteoarthritis, unspecified hip: Secondary | ICD-10-CM | POA: Diagnosis not present

## 2014-03-07 DIAGNOSIS — M961 Postlaminectomy syndrome, not elsewhere classified: Secondary | ICD-10-CM | POA: Diagnosis not present

## 2014-03-07 DIAGNOSIS — J438 Other emphysema: Secondary | ICD-10-CM | POA: Diagnosis not present

## 2014-03-22 ENCOUNTER — Emergency Department: Payer: Self-pay | Admitting: Emergency Medicine

## 2014-03-22 DIAGNOSIS — I10 Essential (primary) hypertension: Secondary | ICD-10-CM | POA: Diagnosis not present

## 2014-03-22 DIAGNOSIS — K219 Gastro-esophageal reflux disease without esophagitis: Secondary | ICD-10-CM | POA: Diagnosis not present

## 2014-03-22 DIAGNOSIS — F17200 Nicotine dependence, unspecified, uncomplicated: Secondary | ICD-10-CM | POA: Diagnosis not present

## 2014-03-22 DIAGNOSIS — G2581 Restless legs syndrome: Secondary | ICD-10-CM | POA: Diagnosis not present

## 2014-03-22 DIAGNOSIS — E876 Hypokalemia: Secondary | ICD-10-CM | POA: Diagnosis not present

## 2014-03-22 DIAGNOSIS — Z79899 Other long term (current) drug therapy: Secondary | ICD-10-CM | POA: Diagnosis not present

## 2014-03-22 DIAGNOSIS — R197 Diarrhea, unspecified: Secondary | ICD-10-CM | POA: Diagnosis not present

## 2014-03-22 LAB — COMPREHENSIVE METABOLIC PANEL
Albumin: 3.9 g/dL (ref 3.4–5.0)
Alkaline Phosphatase: 137 U/L — ABNORMAL HIGH
Anion Gap: 10 (ref 7–16)
BUN: 7 mg/dL (ref 7–18)
Bilirubin,Total: 0.7 mg/dL (ref 0.2–1.0)
Calcium, Total: 9.2 mg/dL (ref 8.5–10.1)
Chloride: 102 mmol/L (ref 98–107)
Co2: 23 mmol/L (ref 21–32)
Creatinine: 0.91 mg/dL (ref 0.60–1.30)
EGFR (African American): >60
EGFR (Non-African Amer.): >60
Glucose: 77 mg/dL (ref 65–99)
Osmolality: 267 (ref 275–301)
Potassium: 2.9 mmol/L — ABNORMAL LOW (ref 3.5–5.1)
SGOT(AST): 44 U/L — ABNORMAL HIGH (ref 15–37)
SGPT (ALT): 32 U/L (ref 12–78)
Sodium: 135 mmol/L — ABNORMAL LOW (ref 136–145)
Total Protein: 7.7 g/dL (ref 6.4–8.2)

## 2014-03-22 LAB — URINALYSIS, COMPLETE
Bilirubin,UR: NEGATIVE
Blood: NEGATIVE
Glucose,UR: NEGATIVE mg/dL (ref 0–75)
Ketone: NEGATIVE
Leukocyte Esterase: NEGATIVE
Nitrite: NEGATIVE
Ph: 6 (ref 4.5–8.0)
Protein: NEGATIVE
RBC,UR: 1 /HPF (ref 0–5)
Specific Gravity: 1.002 (ref 1.003–1.030)
Squamous Epithelial: 1
WBC UR: 2 /HPF (ref 0–5)

## 2014-03-22 LAB — CBC WITH DIFFERENTIAL/PLATELET
Basophil #: 0.1 10*3/uL (ref 0.0–0.1)
Basophil %: 0.8 %
Eosinophil #: 0.1 10*3/uL (ref 0.0–0.7)
Eosinophil %: 1.3 %
HCT: 46.8 % (ref 35.0–47.0)
HGB: 16 g/dL (ref 12.0–16.0)
Lymphocyte #: 1.8 10*3/uL (ref 1.0–3.6)
Lymphocyte %: 21.2 %
MCH: 34.3 pg — ABNORMAL HIGH (ref 26.0–34.0)
MCHC: 34.2 g/dL (ref 32.0–36.0)
MCV: 100 fL (ref 80–100)
Monocyte #: 0.9 x10 3/mm (ref 0.2–0.9)
Monocyte %: 10.9 %
Neutrophil #: 5.4 10*3/uL (ref 1.4–6.5)
Neutrophil %: 65.8 %
Platelet: 173 10*3/uL (ref 150–440)
RBC: 4.68 10*6/uL (ref 3.80–5.20)
RDW: 12.6 % (ref 11.5–14.5)
WBC: 8.3 10*3/uL (ref 3.6–11.0)

## 2014-05-29 ENCOUNTER — Ambulatory Visit: Payer: Self-pay | Admitting: Pain Medicine

## 2014-05-29 DIAGNOSIS — I10 Essential (primary) hypertension: Secondary | ICD-10-CM | POA: Diagnosis not present

## 2014-05-29 DIAGNOSIS — M1610 Unilateral primary osteoarthritis, unspecified hip: Secondary | ICD-10-CM | POA: Diagnosis not present

## 2014-05-29 DIAGNOSIS — M545 Low back pain, unspecified: Secondary | ICD-10-CM | POA: Diagnosis not present

## 2014-05-29 DIAGNOSIS — M5137 Other intervertebral disc degeneration, lumbosacral region: Secondary | ICD-10-CM | POA: Diagnosis not present

## 2014-05-29 DIAGNOSIS — I2510 Atherosclerotic heart disease of native coronary artery without angina pectoris: Secondary | ICD-10-CM | POA: Diagnosis not present

## 2014-05-29 DIAGNOSIS — M961 Postlaminectomy syndrome, not elsewhere classified: Secondary | ICD-10-CM | POA: Diagnosis not present

## 2014-05-29 DIAGNOSIS — K219 Gastro-esophageal reflux disease without esophagitis: Secondary | ICD-10-CM | POA: Diagnosis not present

## 2014-05-29 DIAGNOSIS — F17200 Nicotine dependence, unspecified, uncomplicated: Secondary | ICD-10-CM | POA: Diagnosis not present

## 2014-05-29 DIAGNOSIS — I209 Angina pectoris, unspecified: Secondary | ICD-10-CM | POA: Diagnosis not present

## 2014-05-29 DIAGNOSIS — M4716 Other spondylosis with myelopathy, lumbar region: Secondary | ICD-10-CM | POA: Diagnosis not present

## 2014-05-29 DIAGNOSIS — Z5181 Encounter for therapeutic drug level monitoring: Secondary | ICD-10-CM | POA: Diagnosis not present

## 2014-05-29 DIAGNOSIS — M169 Osteoarthritis of hip, unspecified: Secondary | ICD-10-CM | POA: Diagnosis not present

## 2014-05-29 DIAGNOSIS — IMO0002 Reserved for concepts with insufficient information to code with codable children: Secondary | ICD-10-CM | POA: Diagnosis not present

## 2014-05-29 DIAGNOSIS — IMO0001 Reserved for inherently not codable concepts without codable children: Secondary | ICD-10-CM | POA: Diagnosis not present

## 2014-05-29 DIAGNOSIS — G4733 Obstructive sleep apnea (adult) (pediatric): Secondary | ICD-10-CM | POA: Diagnosis not present

## 2014-05-29 DIAGNOSIS — E669 Obesity, unspecified: Secondary | ICD-10-CM | POA: Diagnosis not present

## 2014-05-29 DIAGNOSIS — J438 Other emphysema: Secondary | ICD-10-CM | POA: Diagnosis not present

## 2014-05-29 DIAGNOSIS — M47817 Spondylosis without myelopathy or radiculopathy, lumbosacral region: Secondary | ICD-10-CM | POA: Diagnosis not present

## 2014-05-29 DIAGNOSIS — G8929 Other chronic pain: Secondary | ICD-10-CM | POA: Diagnosis not present

## 2014-05-29 DIAGNOSIS — Z79899 Other long term (current) drug therapy: Secondary | ICD-10-CM | POA: Diagnosis not present

## 2014-06-17 DIAGNOSIS — H4010X0 Unspecified open-angle glaucoma, stage unspecified: Secondary | ICD-10-CM | POA: Diagnosis not present

## 2014-06-26 DIAGNOSIS — H4010X0 Unspecified open-angle glaucoma, stage unspecified: Secondary | ICD-10-CM | POA: Diagnosis not present

## 2014-09-02 DIAGNOSIS — R609 Edema, unspecified: Secondary | ICD-10-CM | POA: Diagnosis not present

## 2014-09-03 DIAGNOSIS — Z1231 Encounter for screening mammogram for malignant neoplasm of breast: Secondary | ICD-10-CM | POA: Diagnosis not present

## 2014-09-03 LAB — HM MAMMOGRAPHY

## 2014-09-05 ENCOUNTER — Ambulatory Visit: Payer: Self-pay | Admitting: Pain Medicine

## 2014-09-05 DIAGNOSIS — M5416 Radiculopathy, lumbar region: Secondary | ICD-10-CM | POA: Diagnosis not present

## 2014-09-05 DIAGNOSIS — M545 Low back pain: Secondary | ICD-10-CM | POA: Diagnosis not present

## 2014-09-05 DIAGNOSIS — M47817 Spondylosis without myelopathy or radiculopathy, lumbosacral region: Secondary | ICD-10-CM | POA: Diagnosis not present

## 2014-09-05 DIAGNOSIS — M79604 Pain in right leg: Secondary | ICD-10-CM | POA: Diagnosis not present

## 2014-09-05 DIAGNOSIS — G894 Chronic pain syndrome: Secondary | ICD-10-CM | POA: Diagnosis not present

## 2014-09-05 DIAGNOSIS — M79609 Pain in unspecified limb: Secondary | ICD-10-CM | POA: Diagnosis not present

## 2014-09-12 DIAGNOSIS — Z8639 Personal history of other endocrine, nutritional and metabolic disease: Secondary | ICD-10-CM | POA: Diagnosis not present

## 2014-09-12 DIAGNOSIS — I10 Essential (primary) hypertension: Secondary | ICD-10-CM | POA: Diagnosis not present

## 2014-09-12 DIAGNOSIS — E876 Hypokalemia: Secondary | ICD-10-CM | POA: Diagnosis not present

## 2014-09-22 ENCOUNTER — Ambulatory Visit: Payer: Self-pay | Admitting: Family Medicine

## 2014-09-22 DIAGNOSIS — K760 Fatty (change of) liver, not elsewhere classified: Secondary | ICD-10-CM | POA: Diagnosis not present

## 2014-09-22 DIAGNOSIS — R945 Abnormal results of liver function studies: Secondary | ICD-10-CM | POA: Diagnosis not present

## 2014-12-03 DIAGNOSIS — R1314 Dysphagia, pharyngoesophageal phase: Secondary | ICD-10-CM | POA: Diagnosis not present

## 2014-12-03 DIAGNOSIS — K760 Fatty (change of) liver, not elsewhere classified: Secondary | ICD-10-CM | POA: Diagnosis not present

## 2014-12-03 DIAGNOSIS — R748 Abnormal levels of other serum enzymes: Secondary | ICD-10-CM | POA: Diagnosis not present

## 2015-01-02 DIAGNOSIS — H4011X1 Primary open-angle glaucoma, mild stage: Secondary | ICD-10-CM | POA: Diagnosis not present

## 2015-01-26 DIAGNOSIS — M169 Osteoarthritis of hip, unspecified: Secondary | ICD-10-CM | POA: Diagnosis not present

## 2015-01-26 DIAGNOSIS — M25559 Pain in unspecified hip: Secondary | ICD-10-CM | POA: Diagnosis not present

## 2015-01-26 DIAGNOSIS — M545 Low back pain: Secondary | ICD-10-CM | POA: Diagnosis not present

## 2015-01-26 DIAGNOSIS — G8929 Other chronic pain: Secondary | ICD-10-CM | POA: Diagnosis not present

## 2015-01-26 DIAGNOSIS — Z79891 Long term (current) use of opiate analgesic: Secondary | ICD-10-CM | POA: Diagnosis not present

## 2015-01-26 DIAGNOSIS — M791 Myalgia: Secondary | ICD-10-CM | POA: Diagnosis not present

## 2015-01-29 DIAGNOSIS — Z79899 Other long term (current) drug therapy: Secondary | ICD-10-CM | POA: Diagnosis not present

## 2015-04-13 ENCOUNTER — Other Ambulatory Visit: Payer: Self-pay | Admitting: Family Medicine

## 2015-04-13 DIAGNOSIS — I10 Essential (primary) hypertension: Secondary | ICD-10-CM

## 2015-04-20 ENCOUNTER — Encounter (INDEPENDENT_AMBULATORY_CARE_PROVIDER_SITE_OTHER): Payer: Medicare Other | Admitting: Family Medicine

## 2015-04-20 DIAGNOSIS — J449 Chronic obstructive pulmonary disease, unspecified: Secondary | ICD-10-CM

## 2015-04-21 ENCOUNTER — Other Ambulatory Visit: Payer: Self-pay | Admitting: Family Medicine

## 2015-04-21 DIAGNOSIS — J449 Chronic obstructive pulmonary disease, unspecified: Secondary | ICD-10-CM | POA: Insufficient documentation

## 2015-04-23 DIAGNOSIS — Z79891 Long term (current) use of opiate analgesic: Secondary | ICD-10-CM | POA: Diagnosis not present

## 2015-04-23 DIAGNOSIS — G8929 Other chronic pain: Secondary | ICD-10-CM | POA: Diagnosis not present

## 2015-05-10 ENCOUNTER — Other Ambulatory Visit: Payer: Self-pay | Admitting: Family Medicine

## 2015-07-09 DIAGNOSIS — H4011X1 Primary open-angle glaucoma, mild stage: Secondary | ICD-10-CM | POA: Diagnosis not present

## 2015-08-12 ENCOUNTER — Encounter: Payer: Self-pay | Admitting: Pain Medicine

## 2015-08-14 ENCOUNTER — Encounter: Payer: Self-pay | Admitting: Pain Medicine

## 2015-08-14 ENCOUNTER — Ambulatory Visit: Payer: Medicare Other | Attending: Pain Medicine | Admitting: Pain Medicine

## 2015-08-14 VITALS — BP 197/95 | HR 80 | Temp 98.0°F | Resp 16 | Ht 59.5 in | Wt 115.0 lb

## 2015-08-14 DIAGNOSIS — M539 Dorsopathy, unspecified: Secondary | ICD-10-CM

## 2015-08-14 DIAGNOSIS — M545 Low back pain, unspecified: Secondary | ICD-10-CM

## 2015-08-14 DIAGNOSIS — M9979 Connective tissue and disc stenosis of intervertebral foramina of abdomen and other regions: Secondary | ICD-10-CM | POA: Insufficient documentation

## 2015-08-14 DIAGNOSIS — M48061 Spinal stenosis, lumbar region without neurogenic claudication: Secondary | ICD-10-CM | POA: Insufficient documentation

## 2015-08-14 DIAGNOSIS — G8929 Other chronic pain: Secondary | ICD-10-CM | POA: Insufficient documentation

## 2015-08-14 DIAGNOSIS — Z5181 Encounter for therapeutic drug level monitoring: Secondary | ICD-10-CM

## 2015-08-14 DIAGNOSIS — M797 Fibromyalgia: Secondary | ICD-10-CM | POA: Diagnosis not present

## 2015-08-14 DIAGNOSIS — M961 Postlaminectomy syndrome, not elsewhere classified: Secondary | ICD-10-CM | POA: Insufficient documentation

## 2015-08-14 DIAGNOSIS — F17210 Nicotine dependence, cigarettes, uncomplicated: Secondary | ICD-10-CM | POA: Insufficient documentation

## 2015-08-14 DIAGNOSIS — F1190 Opioid use, unspecified, uncomplicated: Secondary | ICD-10-CM | POA: Diagnosis not present

## 2015-08-14 DIAGNOSIS — M129 Arthropathy, unspecified: Secondary | ICD-10-CM

## 2015-08-14 DIAGNOSIS — M25559 Pain in unspecified hip: Secondary | ICD-10-CM | POA: Diagnosis not present

## 2015-08-14 DIAGNOSIS — F17200 Nicotine dependence, unspecified, uncomplicated: Secondary | ICD-10-CM | POA: Insufficient documentation

## 2015-08-14 DIAGNOSIS — Z8639 Personal history of other endocrine, nutritional and metabolic disease: Secondary | ICD-10-CM | POA: Insufficient documentation

## 2015-08-14 DIAGNOSIS — E538 Deficiency of other specified B group vitamins: Secondary | ICD-10-CM | POA: Insufficient documentation

## 2015-08-14 DIAGNOSIS — M79605 Pain in left leg: Secondary | ICD-10-CM

## 2015-08-14 DIAGNOSIS — B351 Tinea unguium: Secondary | ICD-10-CM | POA: Insufficient documentation

## 2015-08-14 DIAGNOSIS — M5441 Lumbago with sciatica, right side: Secondary | ICD-10-CM

## 2015-08-14 DIAGNOSIS — M5442 Lumbago with sciatica, left side: Secondary | ICD-10-CM

## 2015-08-14 DIAGNOSIS — G894 Chronic pain syndrome: Secondary | ICD-10-CM | POA: Diagnosis not present

## 2015-08-14 DIAGNOSIS — Z79899 Other long term (current) drug therapy: Secondary | ICD-10-CM

## 2015-08-14 DIAGNOSIS — Z131 Encounter for screening for diabetes mellitus: Secondary | ICD-10-CM | POA: Insufficient documentation

## 2015-08-14 DIAGNOSIS — M8580 Other specified disorders of bone density and structure, unspecified site: Secondary | ICD-10-CM | POA: Insufficient documentation

## 2015-08-14 DIAGNOSIS — M4806 Spinal stenosis, lumbar region: Secondary | ICD-10-CM | POA: Insufficient documentation

## 2015-08-14 DIAGNOSIS — M47816 Spondylosis without myelopathy or radiculopathy, lumbar region: Secondary | ICD-10-CM | POA: Insufficient documentation

## 2015-08-14 DIAGNOSIS — M549 Dorsalgia, unspecified: Secondary | ICD-10-CM | POA: Diagnosis present

## 2015-08-14 DIAGNOSIS — M25552 Pain in left hip: Secondary | ICD-10-CM

## 2015-08-14 DIAGNOSIS — M1612 Unilateral primary osteoarthritis, left hip: Secondary | ICD-10-CM | POA: Insufficient documentation

## 2015-08-14 DIAGNOSIS — Z79891 Long term (current) use of opiate analgesic: Secondary | ICD-10-CM | POA: Insufficient documentation

## 2015-08-14 DIAGNOSIS — F1120 Opioid dependence, uncomplicated: Secondary | ICD-10-CM

## 2015-08-14 DIAGNOSIS — M5416 Radiculopathy, lumbar region: Secondary | ICD-10-CM

## 2015-08-14 DIAGNOSIS — F1020 Alcohol dependence, uncomplicated: Secondary | ICD-10-CM | POA: Insufficient documentation

## 2015-08-14 MED ORDER — HYDROCODONE-ACETAMINOPHEN 7.5-325 MG PO TABS: 1.0000 | ORAL_TABLET | Freq: Four times a day (QID) | ORAL | Status: DC | PRN

## 2015-08-14 NOTE — Progress Notes (Signed)
Patient's Name: Suzanne Avila MRN: [id number] DOB: 1946-05-04 DOS: 08/14/2015  Primary Reason(s) for Visit: Encounter for Medication Management. CC: Back Pain and Hip Pain   HPI:   Suzanne Avila is a 69 y.o. year old, female patient, who returns today as an established patient. She has Chronic airway obstruction (HCC); Failed back surgical syndrome; Encounter for therapeutic drug level monitoring; Long term current use of opiate analgesic; Long term prescription opiate use; Uncomplicated opioid dependence (HCC); Opiate use; Chronic low back pain; Chronic pain; Chronic pain syndrome; Alcoholic (HCC); Allergic rhinitis; Atrial paroxysmal tachycardia (HCC); B12 deficiency; CAFL (chronic airflow limitation) (HCC); Narrowing of intervertebral disc space; Encounter for screening for diabetes mellitus; Acid reflux; Glaucoma; History of metabolic disorder; Benign essential HTN; Fungal infection of toenail; Osteopenia; Disorder of peripheral nervous system (HCC); Hypercholesterolemia without hypertriglyceridemia; Lumbosacral spondylosis; Compulsive tobacco user syndrome; Absence of bladder continence; Osteoarthritis of left hip; Chronic left hip pain; Left leg pain; Chronic radicular lumbar pain; Facet syndrome, lumbar; Lumbar spondylosis; Spinal stenosis of lumbar region; Postlaminectomy syndrome, lumbar region; Arthropathy of left hip; and Fibromyalgia on her problem list.. Her primarily concern today is the Back Pain and Hip Pain     Pharmacotherapy Review: Side-effects or Adverse reactions: None reported. Effectiveness: Described as relatively effective, allowing for increase in activities of daily living (ADL). Onset of action: Within expected pharmacological parameters. Duration of action: Within normal limits for medication. Peak effect: Timing and results are as within normal expected parameters. [place] PMP: Compliant with practice rules and regulations. DST: Compliant with practice rules and  regulations. Lab work: No new labs ordered by our practice. Treatment compliance: Compliant. Substance Use Disorder (SUD) Risk Level: Low Planned course of action: Continue therapy as is.  Allergies: Suzanne Avila is allergic to codeine.  Meds: The patient has a current medication list which includes the following prescription(s): advair diskus, aspirin, atorvastatin, cholecalciferol, diltiazem, diltiazem, dorzolamide, hydrocodone-acetaminophen, latanoprost, meloxicam, nexium, potassium chloride sa, pregabalin, ramipril, spironolactone, vitamin b-12, hydrocodone-acetaminophen, and hydrocodone-acetaminophen. Requested Prescriptions   Signed Prescriptions Disp Refills  . HYDROcodone-acetaminophen (NORCO) 7.5-325 MG tablet 120 tablet 0    Sig: Take 1 tablet by mouth every 6 (six) hours as needed for moderate pain.  Marland Kitchen. HYDROcodone-acetaminophen (NORCO) 7.5-325 MG tablet 120 tablet 0    Sig: Take 1 tablet by mouth every 6 (six) hours as needed for moderate pain.  Marland Kitchen. HYDROcodone-acetaminophen (NORCO) 7.5-325 MG tablet 120 tablet 0    Sig: Take 1 tablet by mouth every 6 (six) hours as needed for moderate pain.    ROS: Constitutional: Afebrile, no chills, well hydrated and well nourished Gastrointestinal: negative Musculoskeletal:negative Neurological: negative Behavioral/Psych: negative  PFSH: Medical:  Suzanne Avila  has no past medical history on file. Family: family history is not on file. Surgical:  has no past surgical history on file. Tobacco:  reports that she has been smoking Cigarettes.  She has a 50 pack-year smoking history. She does not have any smokeless tobacco history on file. Alcohol:  has no alcohol history on file. Drug:  has no drug history on file.  Physical Exam: Vitals:  Today's Vitals   08/14/15 0912 08/14/15 0916  BP: 197/95   Pulse: 80   Temp: 98 F (36.7 C)   Resp: 16   Height: 4' 11.5" (1.511 m)   Weight: 115 lb (52.164 kg)   SpO2: 98%   PainSc: 0-No  pain 0-No pain  Calculated BMI: Body mass index is 22.85 kg/(m^2). General appearance: alert, cooperative, appears stated age and  mild distress Eyes: conjunctivae/corneas clear. PERRL, EOM's intact. Fundi benign. Lungs: No evidence respiratory distress, no audible rales or ronchi and no use of accessory muscles of respiration Neck: no adenopathy, no carotid bruit, no JVD, supple, symmetrical, trachea midline and thyroid not enlarged, symmetric, no tenderness/mass/nodules Back: symmetric, no curvature. ROM normal. No CVA tenderness. Extremities: extremities normal, atraumatic, no cyanosis or edema Pulses: 2+ and symmetric Skin: Skin color, texture, turgor normal. No rashes or lesions Neurologic: Grossly normal    Assessment: Encounter Diagnosis:  Primary Diagnosis: Chronic pain syndrome [G89.4]  Plan: Suzanne Avila was seen today for back pain and hip pain.  Diagnoses and all orders for this visit:  Chronic pain syndrome  Chronic pain -     HYDROcodone-acetaminophen (NORCO) 7.5-325 MG tablet; Take 1 tablet by mouth every 6 (six) hours as needed for moderate pain. -     HYDROcodone-acetaminophen (NORCO) 7.5-325 MG tablet; Take 1 tablet by mouth every 6 (six) hours as needed for moderate pain. -     Discontinue: HYDROcodone-acetaminophen (NORCO) 7.5-325 MG tablet; Take 1 tablet by mouth every 6 (six) hours as needed for moderate pain. -     HYDROcodone-acetaminophen (NORCO) 7.5-325 MG tablet; Take 1 tablet by mouth every 6 (six) hours as needed for moderate pain.  Chronic low back pain  Opiate use  Uncomplicated opioid dependence (HCC)  Long term prescription opiate use  Long term current use of opiate analgesic -     Drugs of abuse screen w/o alc, rtn urine-sln; Standing  Encounter for therapeutic drug level monitoring  Failed back surgical syndrome  Primary osteoarthritis of left hip  Chronic left hip pain  Left leg pain  Chronic radicular lumbar pain  Facet syndrome,  lumbar  Spinal stenosis of lumbar region  Postlaminectomy syndrome, lumbar region  Arthropathy of left hip  Fibromyalgia     There are no Patient Instructions on file for this visit. Medications discontinued today:  Medications Discontinued During This Encounter  Medication Reason  . HYDROcodone-acetaminophen (NORCO) 7.5-325 MG tablet Reorder  . HYDROcodone-acetaminophen (NORCO) 7.5-325 MG tablet Reorder   Medications administered today:  Suzanne Avila had no medications administered during this visit.  Primary Care Physician: Lorie Phenix, MD Location: [facility] Note by: Sydnee Levans. Laban Emperor, M.D, DABA, DABAPM, DABPM, DABIPP, FIPP

## 2015-08-14 NOTE — Progress Notes (Signed)
Patient here for conversion from [facility] for med refill and evaluation.  She c/o lower back pain more on the Left and left hip pain.  Patient does not have medications in prescription bottles.

## 2015-09-02 ENCOUNTER — Other Ambulatory Visit: Payer: Self-pay

## 2015-09-02 DIAGNOSIS — E876 Hypokalemia: Secondary | ICD-10-CM

## 2015-09-02 DIAGNOSIS — R609 Edema, unspecified: Secondary | ICD-10-CM | POA: Insufficient documentation

## 2015-09-03 ENCOUNTER — Other Ambulatory Visit: Payer: Self-pay

## 2015-09-03 DIAGNOSIS — I10 Essential (primary) hypertension: Secondary | ICD-10-CM

## 2015-09-03 DIAGNOSIS — E876 Hypokalemia: Secondary | ICD-10-CM

## 2015-09-03 MED ORDER — SPIRONOLACTONE 25 MG PO TABS: 25.0000 mg | ORAL_TABLET | Freq: Every day | ORAL | Status: DC

## 2015-09-03 MED ORDER — POTASSIUM CHLORIDE 20 MEQ PO PACK: 20.0000 meq | PACK | Freq: Once | ORAL | Status: DC

## 2015-09-03 NOTE — Telephone Encounter (Signed)
Last OV 08/2014  Thanks,   -Laura  

## 2015-09-04 ENCOUNTER — Other Ambulatory Visit: Payer: Self-pay | Admitting: Family Medicine

## 2015-09-04 MED ORDER — POTASSIUM CHLORIDE CRYS ER 20 MEQ PO TBCR: 20.0000 meq | EXTENDED_RELEASE_TABLET | Freq: Every day | ORAL | Status: DC

## 2015-09-10 ENCOUNTER — Other Ambulatory Visit: Payer: Self-pay | Admitting: Pain Medicine

## 2015-10-16 ENCOUNTER — Other Ambulatory Visit: Payer: Self-pay | Admitting: Family Medicine

## 2015-10-16 DIAGNOSIS — J449 Chronic obstructive pulmonary disease, unspecified: Secondary | ICD-10-CM

## 2015-11-06 ENCOUNTER — Other Ambulatory Visit: Payer: Self-pay | Admitting: Family Medicine

## 2015-11-06 DIAGNOSIS — M1612 Unilateral primary osteoarthritis, left hip: Secondary | ICD-10-CM

## 2015-11-06 NOTE — Telephone Encounter (Signed)
Last OV 08/2014  Thanks,   -Arman Loy  

## 2015-11-11 ENCOUNTER — Encounter: Payer: Self-pay | Admitting: Pain Medicine

## 2015-11-11 ENCOUNTER — Ambulatory Visit: Payer: Medicare Other | Attending: Pain Medicine | Admitting: Pain Medicine

## 2015-11-11 ENCOUNTER — Other Ambulatory Visit: Payer: Self-pay | Admitting: Pain Medicine

## 2015-11-11 VITALS — BP 148/85 | HR 81 | Temp 98.0°F | Resp 20 | Ht 59.0 in | Wt 114.0 lb

## 2015-11-11 DIAGNOSIS — I471 Supraventricular tachycardia: Secondary | ICD-10-CM | POA: Insufficient documentation

## 2015-11-11 DIAGNOSIS — I10 Essential (primary) hypertension: Secondary | ICD-10-CM | POA: Insufficient documentation

## 2015-11-11 DIAGNOSIS — Z79891 Long term (current) use of opiate analgesic: Secondary | ICD-10-CM

## 2015-11-11 DIAGNOSIS — G8929 Other chronic pain: Secondary | ICD-10-CM | POA: Diagnosis not present

## 2015-11-11 DIAGNOSIS — M1612 Unilateral primary osteoarthritis, left hip: Secondary | ICD-10-CM

## 2015-11-11 DIAGNOSIS — M8580 Other specified disorders of bone density and structure, unspecified site: Secondary | ICD-10-CM | POA: Insufficient documentation

## 2015-11-11 DIAGNOSIS — E7800 Pure hypercholesterolemia, unspecified: Secondary | ICD-10-CM | POA: Insufficient documentation

## 2015-11-11 DIAGNOSIS — J449 Chronic obstructive pulmonary disease, unspecified: Secondary | ICD-10-CM | POA: Diagnosis not present

## 2015-11-11 DIAGNOSIS — M12852 Other specific arthropathies, not elsewhere classified, left hip: Secondary | ICD-10-CM | POA: Insufficient documentation

## 2015-11-11 DIAGNOSIS — Z5181 Encounter for therapeutic drug level monitoring: Secondary | ICD-10-CM

## 2015-11-11 DIAGNOSIS — M25559 Pain in unspecified hip: Secondary | ICD-10-CM | POA: Diagnosis not present

## 2015-11-11 DIAGNOSIS — E538 Deficiency of other specified B group vitamins: Secondary | ICD-10-CM | POA: Insufficient documentation

## 2015-11-11 DIAGNOSIS — F17210 Nicotine dependence, cigarettes, uncomplicated: Secondary | ICD-10-CM | POA: Diagnosis not present

## 2015-11-11 DIAGNOSIS — F1120 Opioid dependence, uncomplicated: Secondary | ICD-10-CM | POA: Diagnosis not present

## 2015-11-11 DIAGNOSIS — M4806 Spinal stenosis, lumbar region: Secondary | ICD-10-CM | POA: Insufficient documentation

## 2015-11-11 DIAGNOSIS — M129 Arthropathy, unspecified: Secondary | ICD-10-CM

## 2015-11-11 DIAGNOSIS — M961 Postlaminectomy syndrome, not elsewhere classified: Secondary | ICD-10-CM | POA: Insufficient documentation

## 2015-11-11 DIAGNOSIS — K219 Gastro-esophageal reflux disease without esophagitis: Secondary | ICD-10-CM | POA: Diagnosis not present

## 2015-11-11 DIAGNOSIS — M797 Fibromyalgia: Secondary | ICD-10-CM | POA: Diagnosis not present

## 2015-11-11 DIAGNOSIS — E559 Vitamin D deficiency, unspecified: Secondary | ICD-10-CM | POA: Insufficient documentation

## 2015-11-11 DIAGNOSIS — H409 Unspecified glaucoma: Secondary | ICD-10-CM | POA: Diagnosis not present

## 2015-11-11 DIAGNOSIS — M549 Dorsalgia, unspecified: Secondary | ICD-10-CM | POA: Diagnosis present

## 2015-11-11 LAB — COMPREHENSIVE METABOLIC PANEL
ALT: 17 U/L (ref 14–54)
AST: 22 U/L (ref 15–41)
Albumin: 4.1 g/dL (ref 3.5–5.0)
Alkaline Phosphatase: 92 U/L (ref 38–126)
Anion gap: 11 (ref 5–15)
BUN: 10 mg/dL (ref 6–20)
CO2: 26 mmol/L (ref 22–32)
Calcium: 9.4 mg/dL (ref 8.9–10.3)
Chloride: 99 mmol/L — ABNORMAL LOW (ref 101–111)
Creatinine, Ser: 0.64 mg/dL (ref 0.44–1.00)
GFR calc Af Amer: >60 mL/min (ref >60)
GFR calc non Af Amer: >60 mL/min (ref >60)
Glucose, Bld: 116 mg/dL — ABNORMAL HIGH (ref 65–99)
Potassium: 3.7 mmol/L (ref 3.5–5.1)
Sodium: 136 mmol/L (ref 135–145)
Total Bilirubin: 1 mg/dL (ref 0.3–1.2)
Total Protein: 7.7 g/dL (ref 6.5–8.1)

## 2015-11-11 LAB — MAGNESIUM: Magnesium: 1.8 mg/dL (ref 1.7–2.4)

## 2015-11-11 LAB — C-REACTIVE PROTEIN: CRP: <0.5 mg/dL (ref <1.0)

## 2015-11-11 LAB — SEDIMENTATION RATE: Sed Rate: 23 mm/hr (ref 0–30)

## 2015-11-11 MED ORDER — HYDROCODONE-ACETAMINOPHEN 7.5-325 MG PO TABS: 1.0000 | ORAL_TABLET | Freq: Four times a day (QID) | ORAL | Status: DC | PRN

## 2015-11-11 MED ORDER — PREGABALIN 150 MG PO CAPS: 150.0000 mg | ORAL_CAPSULE | Freq: Four times a day (QID) | ORAL | Status: DC

## 2015-11-11 MED ORDER — MELOXICAM 15 MG PO TABS: 15.0000 mg | ORAL_TABLET | Freq: Every day | ORAL | Status: DC

## 2015-11-11 NOTE — Progress Notes (Signed)
Safety precautions to be maintained throughout the outpatient stay will include: orient to surroundings, keep bed in low position, maintain call bell within reach at all times, provide assistance with transfer out of bed and ambulation. Hydrocodone pill count #14 

## 2015-11-11 NOTE — Patient Instructions (Addendum)
Smoking Cessation, Tips for Success If you are ready to quit smoking, congratulations! You have chosen to help yourself be healthier. Cigarettes bring nicotine, tar, carbon monoxide, and other irritants into your body. Your lungs, heart, and blood vessels will be able to work better without these poisons. There are many different ways to quit smoking. Nicotine gum, nicotine patches, a nicotine inhaler, or nicotine nasal spray can help with physical craving. Hypnosis, support groups, and medicines help break the habit of smoking. WHAT THINGS CAN I DO TO MAKE QUITTING EASIER?  Here are some tips to help you quit for good:  Pick a date when you will quit smoking completely. Tell all of your friends and family about your plan to quit on that date.  Do not try to slowly cut down on the number of cigarettes you are smoking. Pick a quit date and quit smoking completely starting on that day.  Throw away all cigarettes.   Clean and remove all ashtrays from your home, work, and car.  On a card, write down your reasons for quitting. Carry the card with you and read it when you get the urge to smoke.  Cleanse your body of nicotine. Drink enough water and fluids to keep your urine clear or pale yellow. Do this after quitting to flush the nicotine from your body.  Learn to predict your moods. Do not let a bad situation be your excuse to have a cigarette. Some situations in your life might tempt you into wanting a cigarette.  Never have "just one" cigarette. It leads to wanting another and another. Remind yourself of your decision to quit.  Change habits associated with smoking. If you smoked while driving or when feeling stressed, try other activities to replace smoking. Stand up when drinking your coffee. Brush your teeth after eating. Sit in a different chair when you read the paper. Avoid alcohol while trying to quit, and try to drink fewer caffeinated beverages. Alcohol and caffeine may urge you to  smoke.  Avoid foods and drinks that can trigger a desire to smoke, such as sugary or spicy foods and alcohol.  Ask people who smoke not to smoke around you.  Have something planned to do right after eating or having a cup of coffee. For example, plan to take a walk or exercise.  Try a relaxation exercise to calm you down and decrease your stress. Remember, you may be tense and nervous for the first 2 weeks after you quit, but this will pass.  Find new activities to keep your hands busy. Play with a pen, coin, or rubber band. Doodle or draw things on paper.  Brush your teeth right after eating. This will help cut down on the craving for the taste of tobacco after meals. You can also try mouthwash.   Use oral substitutes in place of cigarettes. Try using lemon drops, carrots, cinnamon sticks, or chewing gum. Keep them handy so they are available when you have the urge to smoke.  When you have the urge to smoke, try deep breathing.  Designate your home as a nonsmoking area.  If you are a heavy smoker, ask your health care provider about a prescription for nicotine chewing gum. It can ease your withdrawal from nicotine.  Reward yourself. Set aside the cigarette money you save and buy yourself something nice.  Look for support from others. Join a support group or smoking cessation program. Ask someone at home or at work to help you with your plan   to quit smoking.  Always ask yourself, "Do I need this cigarette or is this just a reflex?" Tell yourself, "Today, I choose not to smoke," or "I do not want to smoke." You are reminding yourself of your decision to quit.  Do not replace cigarette smoking with electronic cigarettes (commonly called e-cigarettes). The safety of e-cigarettes is unknown, and some may contain harmful chemicals.  If you relapse, do not give up! Plan ahead and think about what you will do the next time you get the urge to smoke. HOW WILL I FEEL WHEN I QUIT SMOKING? You  may have symptoms of withdrawal because your body is used to nicotine (the addictive substance in cigarettes). You may crave cigarettes, be irritable, feel very hungry, cough often, get headaches, or have difficulty concentrating. The withdrawal symptoms are only temporary. They are strongest when you first quit but will go away within 10-14 days. When withdrawal symptoms occur, stay in control. Think about your reasons for quitting. Remind yourself that these are signs that your body is healing and getting used to being without cigarettes. Remember that withdrawal symptoms are easier to treat than the major diseases that smoking can cause.  Even after the withdrawal is over, expect periodic urges to smoke. However, these cravings are generally short lived and will go away whether you smoke or not. Do not smoke! WHAT RESOURCES ARE AVAILABLE TO HELP ME QUIT SMOKING? Your health care provider can direct you to community resources or hospitals for support, which may include:  Group support.  Education.  Hypnosis.  Therapy.   This information is not intended to replace advice given to you by your health care provider. Make sure you discuss any questions you have with your health care provider.   Document Released: 07/08/2004 Document Revised: 10/31/2014 Document Reviewed: 03/28/2013 Elsevier Interactive Patient Education [employer] Patient informed that she has lab work to be drawn at Pre admit testing and xrays in the medical mall.

## 2015-11-11 NOTE — Progress Notes (Signed)
Patient's Name: Suzanne Avila MRN: [id number] DOB: 01/21/1946 DOS: 11/11/2015  Primary Reason(s) for Visit: Encounter for Medication Management CC: Back Pain   HPI:  Suzanne Avila is a 70 y.o. year old, female patient, who returns today as an established patient. She has Chronic airway obstruction (HCC); Failed back surgical syndrome; Encounter for therapeutic drug level monitoring; Long term current use of opiate analgesic; Long term prescription opiate use; Uncomplicated opioid dependence (HCC); Opiate use; Chronic low back pain; Chronic pain; Chronic pain syndrome; Alcoholic (HCC); Allergic rhinitis; Atrial paroxysmal tachycardia (HCC); B12 deficiency; CAFL (chronic airflow limitation) (HCC); Narrowing of intervertebral disc space; Encounter for screening for diabetes mellitus; Acid reflux; Glaucoma; History of metabolic disorder; Benign essential HTN; Fungal infection of toenail; Osteopenia; Disorder of peripheral nervous system (HCC); Hypercholesterolemia without hypertriglyceridemia; Lumbosacral spondylosis; Compulsive tobacco user syndrome; Absence of bladder continence; Osteoarthritis of left hip; Chronic left hip pain; Left leg pain; Chronic radicular lumbar pain; Facet syndrome, lumbar; Lumbar spondylosis; Spinal stenosis of lumbar region; Postlaminectomy syndrome, lumbar region; Arthropathy of left hip; Fibromyalgia; Hypokalemia; Edema; and Chronic hip pain (Bilateral) (L>R) on her problem list.. Her primarily concern today is the Back Pain   The patient returns to the clinic today for pharmacological management of her chronic pain. The patient describes her low back pain as being the primary source of pain. She indicates that the pain is primarily on the left side. Her secondary pain is in both hips with the left being worst on the right. The patient has a history significant for a failed back surgery syndrome.  Reported Pain Score: 8 , clinically she looks like a 3/10. Reported level is  inconsistent with clinical obrservations. Pain Type: Chronic pain Pain Location: Back Pain Orientation: Lower Pain Descriptors / Indicators: Aching, Sharp, Stabbing Pain Frequency: Constant  Date of Last Visit: 08/14/15 Service Provided on Last Visit: Med Refill  Pharmacotherapy  Review:   Onset of action: Within expected pharmacological parameters Time to Peak effect: Timing and results are as within normal expected parameters Effectiveness: Described as relatively effective, allowing for increase in activities of daily living (ADL) % Relief: More than 50% Side-effects or Adverse reactions: None reported Duration of action: Within normal limits for medication [place] PMP: Compliant with practice rules and regulations UDS Results: Last UDS done on 08/14/2015 was read as abnormal with unexpected results. However, it turns out that the wrong medication was documented. The patient was not taking oxycodone instead, she was taking hydrocodone. UDS Interpretation: Patient appears to be compliant with practice rules and regulations Medication Assessment Form: Reviewed. Patient indicates being compliant with therapy Treatment compliance: Compliant Substance Use Disorder (SUD) Risk Level: Low Pharmacologic Plan: Continue therapy as is  Lab Work: Illicit Drugs No results found for: THCU, COCAINSCRNUR, PCPSCRNUR, MDMA, AMPHETMU, METHADONE, ETOH  Inflammation Markers No results found for: ESRSEDRATE, CRP  Renal Function Lab Results  Component Value Date   BUN 7 03/22/2014   CREATININE 0.91 03/22/2014   GFRAA >60 03/22/2014   GFRNONAA >60 03/22/2014    Hepatic Function Lab Results  Component Value Date   AST 44* 03/22/2014   ALT 32 03/22/2014   ALBUMIN 3.9 03/22/2014    Electrolytes Lab Results  Component Value Date   NA 135* 03/22/2014   K 2.9* 03/22/2014   CL 102 03/22/2014   CALCIUM 9.2 03/22/2014    Allergies:  Ms. Lippold is allergic to codeine; cyclobenzaprine; and  penicillins.  Meds:  The patient has a current medication list which includes the  following prescription(s): advair diskus, albuterol, aspirin, atorvastatin, brimonidine, cholecalciferol, diltiazem, dorzolamide, esomeprazole, fluticasone-salmeterol, hydrocodone-acetaminophen, hydrocodone-acetaminophen, hydrocodone-acetaminophen, latanoprost, meloxicam, potassium chloride sa, pregabalin, ramipril, spironolactone, and vitamin b-12.  ROS:  Constitutional: Afebrile, no chills, well hydrated and well nourished Gastrointestinal: negative Musculoskeletal:negative Neurological: negative Behavioral/Psych: negative  PFSH:  Medical:  Suzanne Avila  has a past medical history of Hypertension and Glaucoma. Family: family history includes Cancer in her father and mother. Surgical:  has past surgical history that includes Back surgery; Eye surgery; and Carpal tunnel release (Left). Tobacco:  reports that she has been smoking Cigarettes.  She has a 50 pack-year smoking history. She does not have any smokeless tobacco history on file. Alcohol:  has no alcohol history on file. Drug:  has no drug history on file.  Physical Exam:  Vitals:  Today's Vitals   11/11/15 0944 11/11/15 0947  BP: 148/85   Pulse: 81   Temp: 98 F (36.7 C)   Resp: 20   Height:  (1.499 m)   Weight: 114 lb (51.71 kg)   SpO2: 99%   PainSc: 8  8   PainLoc: Back     Calculated BMI: Body mass index is 23.01 kg/(m^2).  General appearance: alert, cooperative, appears older than stated age and mild distress Eyes: PERLA Respiratory: No evidence respiratory distress, no audible rales or ronchi and no use of accessory muscles of respiration  Cervical Spine Inspection: Normal anatomy Alignment: Symetrical Palpation: WNL ROM: Adequate  Upper Extremities Inspection: No gross anomalies detected ROM: Adequate Sensory: Normal Motor: Unremarkable Pulses: Palpable  Thoracic Spine Inspection: No gross anomalies  detected Alignment: Symetrical Palpation: WNL ROM: Adequate  Lumbar Spine Inspection: No gross anomalies detected Alignment: Symetrical Palpation: WNL ROM: Decreased on both hip joints. Provocative Tests: Lumbar Hyperextension and rotation test: Equivocal Patrick's Maneuver: Positive bilaterally for pain arising from the hip joints. Gait: Antalgic (limping)  Lower Extremities Inspection: No gross anomalies detected ROM: Adequate Sensory: Normal Motor: Unremarkable  Assessment & Plan:  Primary Diagnosis & Pertinent Problem List: The primary encounter diagnosis was Chronic pain. Diagnoses of Encounter for therapeutic drug level monitoring, Long term current use of opiate analgesic, Postlaminectomy syndrome, lumbar region, Chronic hip pain, unspecified laterality, Arthropathy of left hip, and Fibromyalgia were also pertinent to this visit.  Assessment: No problem-specific assessment & plan notes found for this encounter.   Pharmacotherapy Orders: Meds ordered this encounter  Medications  . meloxicam (MOBIC) 15 MG tablet    Sig: Take 1 tablet (15 mg total) by mouth daily.    Dispense:  30 tablet    Refill:  2    Do not place this medication, or any other prescription from our practice, on "Automatic Refill". Patient may have prescription filled one day early if pharmacy is closed on scheduled refill date.  Marland Kitchen HYDROcodone-acetaminophen (NORCO) 7.5-325 MG tablet    Sig: Take 1 tablet by mouth every 6 (six) hours as needed for moderate pain or severe pain.    Dispense:  120 tablet    Refill:  0    Do not place this medication, or any other prescription from our practice, on "Automatic Refill". Patient may have prescription filled one day early if pharmacy is closed on scheduled refill date. Do not fill until: 11/12/15 To last until: 12/12/15  . HYDROcodone-acetaminophen (NORCO) 7.5-325 MG tablet    Sig: Take 1 tablet by mouth every 6 (six) hours as needed for moderate pain or  severe pain.    Dispense:  120 tablet  Refill:  0    Do not place this medication, or any other prescription from our practice, on "Automatic Refill". Patient may have prescription filled one day early if pharmacy is closed on scheduled refill date. Do not fill until: 12/12/15 To last until: 01/08/16  . HYDROcodone-acetaminophen (NORCO) 7.5-325 MG tablet    Sig: Take 1 tablet by mouth every 6 (six) hours as needed for moderate pain or severe pain.    Dispense:  120 tablet    Refill:  0    Do not place this medication, or any other prescription from our practice, on "Automatic Refill". Patient may have prescription filled one day early if pharmacy is closed on scheduled refill date. Do not fill until: 01/08/16 To last until: 02/07/16  . pregabalin (LYRICA) 150 MG capsule    Sig: Take 1 capsule (150 mg total) by mouth 4 (four) times daily.    Dispense:  120 capsule    Refill:  2    Do not place this medication, or any other prescription from our practice, on "Automatic Refill". Patient may have prescription filled one day early if pharmacy is closed on scheduled refill date.    Lab-work & Procedure Orders: Orders Placed This Encounter  Procedures  . DG Lumbar Spine Complete W/Bend  . DG HIP UNILAT W OR W/O PELVIS 2-3 VIEWS LEFT  . DG HIP UNILAT W OR W/O PELVIS 2-3 VIEWS RIGHT    Radiology Orders: DG LUMBAR SPINE COMPLETE W/BEND 6+V DG HIP UNILAT W OR W/O PELVIS 2-3 VIEWS LEFT DG HIP UNILAT W OR W/O PELVIS 2-3 VIEWS RIGHT  Interventional Therapies: PRN procedure:  1. Left lumbar facet block under fluoroscopic guidance and IV sedation for the low back pain. 2. Bilateral intra-articular hip injection under fluoroscopic guidance and IV sedation for the hip pain.   Administered Medications: Ms. Sawatzky had no medications administered during this visit.  Primary Care Physician: Lorie Phenix, MD Location: [facility] Note by: Sydnee Levans. Laban Emperor, M.D,  DABA, DABAPM, DABPM, DABIPP, FIPP

## 2015-11-13 ENCOUNTER — Other Ambulatory Visit: Payer: Self-pay | Admitting: Family Medicine

## 2015-11-13 DIAGNOSIS — I10 Essential (primary) hypertension: Secondary | ICD-10-CM

## 2015-11-13 MED ORDER — SPIRONOLACTONE 25 MG PO TABS: 25.0000 mg | ORAL_TABLET | Freq: Every day | ORAL | Status: DC

## 2015-11-13 NOTE — Telephone Encounter (Signed)
Last ov 10/2013

## 2015-11-15 LAB — TOXASSURE SELECT 13 (MW), URINE: PDF: 0

## 2015-12-03 NOTE — Progress Notes (Signed)
Quick Note:  Lab results reviewed and found to be within normal limits. ______ 

## 2015-12-03 NOTE — Progress Notes (Signed)
Quick Note:   Normal chloride levels are between 95 and 107 mEq/L. Low levels may be due to: Addison disease; Bartter syndrome; burns; congestive heart failure; dehydration; excessive sweating; hyperaldosteronism; metabolic alkalosis; respiratory acidosis (compensated); Syndrome of inappropriate diuretic hormone secretion (SIADH); or vomiting.  Normal fasting (NPO x 8 hours) glucose levels are between 65-99 mg/dl, with 2 hour fasting, levels are usually less than 140 mg/dl. Any random blood glucose level greater than 200 mg/dl is considered to be Diabetes.  ______ 

## 2016-01-04 ENCOUNTER — Other Ambulatory Visit: Payer: Self-pay | Admitting: Family Medicine

## 2016-01-04 DIAGNOSIS — I10 Essential (primary) hypertension: Secondary | ICD-10-CM

## 2016-01-04 NOTE — Telephone Encounter (Signed)
09/12/14 last ov

## 2016-01-06 DIAGNOSIS — H401131 Primary open-angle glaucoma, bilateral, mild stage: Secondary | ICD-10-CM | POA: Diagnosis not present

## 2016-01-11 DIAGNOSIS — H401131 Primary open-angle glaucoma, bilateral, mild stage: Secondary | ICD-10-CM | POA: Diagnosis not present

## 2016-01-20 ENCOUNTER — Other Ambulatory Visit: Payer: Self-pay | Admitting: Family Medicine

## 2016-01-20 DIAGNOSIS — E7800 Pure hypercholesterolemia, unspecified: Secondary | ICD-10-CM

## 2016-01-20 NOTE — Telephone Encounter (Signed)
Last OV 08/2014  Thanks,   -Laura  

## 2016-01-25 ENCOUNTER — Telehealth: Payer: Self-pay | Admitting: *Deleted

## 2016-01-25 ENCOUNTER — Telehealth: Payer: Self-pay | Admitting: Pain Medicine

## 2016-01-25 NOTE — Telephone Encounter (Signed)
Patient is out of Lyrica, last appt was January 18 and was given Lyrica Rx with 2 refills.  Has appt on Monday 02/01/16.  If called in would like to [facility] through [facility] with 3 month supply for cost purposes. Initial script was filled at local pharmacy, but with next fill price significantly increases.

## 2016-01-25 NOTE — Telephone Encounter (Addendum)
Spoke with Mr Suzanne Avila to let him know that we can call enough Lyrica to last until Monday 02/01/16.  Patient verbalizes u/o this information.  Spoke with pharmacyst, [facility] and he states that Mrs Suzanne Avila does have another fill left, it is the cost that is causing the problem.  However, if we call this into [facility] it would have to be a 90 day supply.  Vincenza HewsShane will call patient back to confirm if she would like enough medicine to last until Monday.

## 2016-01-25 NOTE — Telephone Encounter (Signed)
Wants to know if script was for one month or 3 month, please call to discuss

## 2016-02-01 ENCOUNTER — Encounter: Payer: Self-pay | Admitting: Pain Medicine

## 2016-02-01 ENCOUNTER — Ambulatory Visit: Payer: Medicare Other | Attending: Pain Medicine | Admitting: Pain Medicine

## 2016-02-01 VITALS — BP 128/73 | HR 77 | Temp 98.4°F | Resp 18 | Ht 59.0 in | Wt 112.0 lb

## 2016-02-01 DIAGNOSIS — M8580 Other specified disorders of bone density and structure, unspecified site: Secondary | ICD-10-CM | POA: Insufficient documentation

## 2016-02-01 DIAGNOSIS — E7800 Pure hypercholesterolemia, unspecified: Secondary | ICD-10-CM | POA: Diagnosis not present

## 2016-02-01 DIAGNOSIS — Z79899 Other long term (current) drug therapy: Secondary | ICD-10-CM

## 2016-02-01 DIAGNOSIS — M4806 Spinal stenosis, lumbar region: Secondary | ICD-10-CM | POA: Diagnosis not present

## 2016-02-01 DIAGNOSIS — F1020 Alcohol dependence, uncomplicated: Secondary | ICD-10-CM | POA: Diagnosis not present

## 2016-02-01 DIAGNOSIS — H409 Unspecified glaucoma: Secondary | ICD-10-CM | POA: Diagnosis not present

## 2016-02-01 DIAGNOSIS — J449 Chronic obstructive pulmonary disease, unspecified: Secondary | ICD-10-CM | POA: Insufficient documentation

## 2016-02-01 DIAGNOSIS — M961 Postlaminectomy syndrome, not elsewhere classified: Secondary | ICD-10-CM | POA: Diagnosis not present

## 2016-02-01 DIAGNOSIS — Z5181 Encounter for therapeutic drug level monitoring: Secondary | ICD-10-CM

## 2016-02-01 DIAGNOSIS — M129 Arthropathy, unspecified: Secondary | ICD-10-CM

## 2016-02-01 DIAGNOSIS — I471 Supraventricular tachycardia: Secondary | ICD-10-CM | POA: Insufficient documentation

## 2016-02-01 DIAGNOSIS — M797 Fibromyalgia: Secondary | ICD-10-CM

## 2016-02-01 DIAGNOSIS — F17210 Nicotine dependence, cigarettes, uncomplicated: Secondary | ICD-10-CM | POA: Diagnosis not present

## 2016-02-01 DIAGNOSIS — M545 Low back pain: Secondary | ICD-10-CM | POA: Insufficient documentation

## 2016-02-01 DIAGNOSIS — J309 Allergic rhinitis, unspecified: Secondary | ICD-10-CM | POA: Insufficient documentation

## 2016-02-01 DIAGNOSIS — E876 Hypokalemia: Secondary | ICD-10-CM | POA: Insufficient documentation

## 2016-02-01 DIAGNOSIS — G8929 Other chronic pain: Secondary | ICD-10-CM | POA: Diagnosis not present

## 2016-02-01 DIAGNOSIS — Z79891 Long term (current) use of opiate analgesic: Secondary | ICD-10-CM | POA: Diagnosis not present

## 2016-02-01 DIAGNOSIS — E559 Vitamin D deficiency, unspecified: Secondary | ICD-10-CM | POA: Diagnosis not present

## 2016-02-01 DIAGNOSIS — M1612 Unilateral primary osteoarthritis, left hip: Secondary | ICD-10-CM | POA: Diagnosis not present

## 2016-02-01 DIAGNOSIS — K219 Gastro-esophageal reflux disease without esophagitis: Secondary | ICD-10-CM | POA: Diagnosis not present

## 2016-02-01 DIAGNOSIS — G894 Chronic pain syndrome: Secondary | ICD-10-CM | POA: Diagnosis not present

## 2016-02-01 DIAGNOSIS — M25551 Pain in right hip: Secondary | ICD-10-CM | POA: Diagnosis not present

## 2016-02-01 DIAGNOSIS — E538 Deficiency of other specified B group vitamins: Secondary | ICD-10-CM | POA: Insufficient documentation

## 2016-02-01 DIAGNOSIS — I10 Essential (primary) hypertension: Secondary | ICD-10-CM | POA: Diagnosis not present

## 2016-02-01 DIAGNOSIS — M25552 Pain in left hip: Secondary | ICD-10-CM | POA: Diagnosis not present

## 2016-02-01 DIAGNOSIS — M549 Dorsalgia, unspecified: Secondary | ICD-10-CM | POA: Diagnosis present

## 2016-02-01 MED ORDER — MELOXICAM 15 MG PO TABS: 15.0000 mg | ORAL_TABLET | Freq: Every day | ORAL | Status: DC

## 2016-02-01 MED ORDER — HYDROCODONE-ACETAMINOPHEN 7.5-325 MG PO TABS: 1.0000 | ORAL_TABLET | Freq: Four times a day (QID) | ORAL | Status: DC | PRN

## 2016-02-01 MED ORDER — PREGABALIN 150 MG PO CAPS: 150.0000 mg | ORAL_CAPSULE | Freq: Four times a day (QID) | ORAL | Status: DC

## 2016-02-01 NOTE — Patient Instructions (Signed)
Smoking Cessation, Tips for Success If you are ready to quit smoking, congratulations! You have chosen to help yourself be healthier. Cigarettes bring nicotine, tar, carbon monoxide, and other irritants into your body. Your lungs, heart, and blood vessels will be able to work better without these poisons. There are many different ways to quit smoking. Nicotine gum, nicotine patches, a nicotine inhaler, or nicotine nasal spray can help with physical craving. Hypnosis, support groups, and medicines help break the habit of smoking. WHAT THINGS CAN I DO TO MAKE QUITTING EASIER?  Here are some tips to help you quit for good:  Pick a date when you will quit smoking completely. Tell all of your friends and family about your plan to quit on that date.  Do not try to slowly cut down on the number of cigarettes you are smoking. Pick a quit date and quit smoking completely starting on that day.  Throw away all cigarettes.   Clean and remove all ashtrays from your home, work, and car.  On a card, write down your reasons for quitting. Carry the card with you and read it when you get the urge to smoke.  Cleanse your body of nicotine. Drink enough water and fluids to keep your urine clear or pale yellow. Do this after quitting to flush the nicotine from your body.  Learn to predict your moods. Do not let a bad situation be your excuse to have a cigarette. Some situations in your life might tempt you into wanting a cigarette.  Never have "just one" cigarette. It leads to wanting another and another. Remind yourself of your decision to quit.  Change habits associated with smoking. If you smoked while driving or when feeling stressed, try other activities to replace smoking. Stand up when drinking your coffee. Brush your teeth after eating. Sit in a different chair when you read the paper. Avoid alcohol while trying to quit, and try to drink fewer caffeinated beverages. Alcohol and caffeine may urge you to  smoke.  Avoid foods and drinks that can trigger a desire to smoke, such as sugary or spicy foods and alcohol.  Ask people who smoke not to smoke around you.  Have something planned to do right after eating or having a cup of coffee. For example, plan to take a walk or exercise.  Try a relaxation exercise to calm you down and decrease your stress. Remember, you may be tense and nervous for the first 2 weeks after you quit, but this will pass.  Find new activities to keep your hands busy. Play with a pen, coin, or rubber band. Doodle or draw things on paper.  Brush your teeth right after eating. This will help cut down on the craving for the taste of tobacco after meals. You can also try mouthwash.   Use oral substitutes in place of cigarettes. Try using lemon drops, carrots, cinnamon sticks, or chewing gum. Keep them handy so they are available when you have the urge to smoke.  When you have the urge to smoke, try deep breathing.  Designate your home as a nonsmoking area.  If you are a heavy smoker, ask your health care provider about a prescription for nicotine chewing gum. It can ease your withdrawal from nicotine.  Reward yourself. Set aside the cigarette money you save and buy yourself something nice.  Look for support from others. Join a support group or smoking cessation program. Ask someone at home or at work to help you with your plan   to quit smoking.  Always ask yourself, "Do I need this cigarette or is this just a reflex?" Tell yourself, "Today, I choose not to smoke," or "I do not want to smoke." You are reminding yourself of your decision to quit.  Do not replace cigarette smoking with electronic cigarettes (commonly called e-cigarettes). The safety of e-cigarettes is unknown, and some may contain harmful chemicals.  If you relapse, do not give up! Plan ahead and think about what you will do the next time you get the urge to smoke. HOW WILL I FEEL WHEN I QUIT SMOKING? You  may have symptoms of withdrawal because your body is used to nicotine (the addictive substance in cigarettes). You may crave cigarettes, be irritable, feel very hungry, cough often, get headaches, or have difficulty concentrating. The withdrawal symptoms are only temporary. They are strongest when you first quit but will go away within 10-14 days. When withdrawal symptoms occur, stay in control. Think about your reasons for quitting. Remind yourself that these are signs that your body is healing and getting used to being without cigarettes. Remember that withdrawal symptoms are easier to treat than the major diseases that smoking can cause.  Even after the withdrawal is over, expect periodic urges to smoke. However, these cravings are generally short lived and will go away whether you smoke or not. Do not smoke! WHAT RESOURCES ARE AVAILABLE TO HELP ME QUIT SMOKING? Your health care provider can direct you to community resources or hospitals for support, which may include:  Group support.  Education.  Hypnosis.  Therapy.   This information is not intended to replace advice given to you by your health care provider. Make sure you discuss any questions you have with your health care provider.   Document Released: 07/08/2004 Document Revised: 10/31/2014 Document Reviewed: 03/28/2013 Elsevier Interactive Patient Education 2016 [employer] Inc.  

## 2016-02-01 NOTE — Progress Notes (Signed)
Patient's Name: Suzanne Avila  Patient type: Established  MRN: [id number]  Service setting: Ambulatory outpatient  DOB: [birth year]  Location: [facility]  DOS: 02/01/2016  Primary Care Physician: Lorie Phenix, MD  Note by: Sydnee Levans. Laban Emperor, M.D, DABA, DABAPM, DABPM, Olga Coaster, FIPP  Referring Physician: Lorie Phenix, MD  Specialty: Board-Certified Interventional Pain Management     Primary Reason(s) for Visit: Encounter for prescription drug management (Level of risk: moderate) CC: Back Pain   HPI  [name] is a 70 y.o. year old, female patient, who returns today as an established patient. She has Chronic airway obstruction (HCC); Failed back surgical syndrome; Encounter for therapeutic drug level monitoring; Long term current use of opiate analgesic; Long term prescription opiate use; Uncomplicated opioid dependence (HCC); Opiate use; Chronic low back pain; Chronic pain; Chronic pain syndrome; Alcoholic (HCC); Allergic rhinitis; Atrial paroxysmal tachycardia (HCC); B12 deficiency; CAFL (chronic airflow limitation) (HCC); Narrowing of intervertebral disc space; Encounter for screening for diabetes mellitus; Acid reflux; Glaucoma; History of metabolic disorder; Benign essential HTN; Fungal infection of toenail; Osteopenia; Disorder of peripheral nervous system (HCC); Hypercholesterolemia without hypertriglyceridemia; Lumbosacral spondylosis; Compulsive tobacco user syndrome; Absence of bladder continence; Osteoarthritis of left hip; Chronic left hip pain; Left leg pain; Chronic radicular lumbar pain; Facet syndrome, lumbar; Lumbar spondylosis; Spinal stenosis of lumbar region; Postlaminectomy syndrome, lumbar region; Arthropathy of left hip; Fibromyalgia; Hypokalemia; Edema; Chronic hip pain (Bilateral) (L>R); and Vitamin D deficiency on her problem list.. Her primarily concern today is the Back Pain   Pain Assessment: Self-Reported Pain Score: 4 , clinically she  looks like a 1/10. Reported level is compatible with observation Pain Type: Chronic pain Pain Location: Back Pain Orientation: Lower, Left Pain Descriptors / Indicators: Aching, Throbbing, Stabbing, Sharp Pain Frequency: Constant  The patient returns to the clinics today for pharmacological management of her chronic pain. She seems to be doing well and indicates not needing any changes.  Date of Last Visit: 11/11/15 Service Provided on Last Visit: Med Refill   Controlled Substance Pharmacotherapy Assessment  Analgesic: Hydrocodone/APAP 7.5/325 one every 6 hours (30 mg per day) Pill Count: Hydrocodone pill count # 52/120 Filled 01-08-16 MME/day: 30 mg/day.  Pharmacokinetics: Onset of action (Liberation/Absorption): Within expected pharmacological parameters Time to Peak effect (Distribution): Timing and results are as within normal expected parameters Duration of action (Metabolism/Excretion): Within normal limits for medication Pharmacodynamics: Analgesic Effect: More than 50% Activity Facilitation: Medication(s) allow patient to sit, stand, walk, and do the basic ADLs Perceived Effectiveness: Described as relatively effective, allowing for increase in activities of daily living (ADL) Side-effects or Adverse reactions: None reported Monitoring: [place] PMP: Online review of the past 23-month period conducted. Compliant with practice rules and regulations UDS Results/interpretation: Last UDS done on 11/11/2015 came back within normal limits with no unexpected results. Medication Assessment Form: Reviewed. Patient indicates being compliant with therapy Treatment compliance: Compliant Risk Assessment: Aberrant Behavior: None observed today Substance Use Disorder (SUD) Risk Level: Low Risk of opioid abuse or dependence: 0.7-3.0% with doses ? 36 MME/day and 6.1-26% with doses ? 120 MME/day. Opioid Risk Tool (ORT) Score: Total Score: 0 Low Risk for SUD (Score <3) Depression Scale  Score: PHQ-2: PHQ-2 Total Score: 0 No depression (0) PHQ-9: PHQ-9 Total Score: 0 No depression (0-4)  Pharmacologic Plan: No change in therapy, at this time  Laboratory Chemistry  Inflammation Markers Lab Results  Component Value Date   ESRSEDRATE 23 11/11/2015   CRP <0.5 11/11/2015    Renal Function  Lab Results  Component Value Date   BUN 10 11/11/2015   CREATININE 0.64 11/11/2015   GFRAA >60 11/11/2015   GFRNONAA >60 11/11/2015    Hepatic Function Lab Results  Component Value Date   AST 22 11/11/2015   ALT 17 11/11/2015   ALBUMIN 4.1 11/11/2015    Electrolytes Lab Results  Component Value Date   NA 136 11/11/2015   K 3.7 11/11/2015   CL 99* 11/11/2015   CALCIUM 9.4 11/11/2015   MG 1.8 11/11/2015    Pain Modulating Vitamins No results found for: VD25OH, VD125OH2TOT, ZO1096EA5, WU9811BJ4, VITAMINB12  Coagulation Parameters No results found for: INR, LABPROT  Note: I personally reviewed the above data. Results shared with patient.  Meds  The patient has a current medication list which includes the following prescription(s): advair diskus, albuterol, aspirin, atorvastatin, brimonidine, cholecalciferol, diltiazem, dorzolamide, hydrocodone-acetaminophen, hydrocodone-acetaminophen, hydrocodone-acetaminophen, latanoprost, meloxicam, potassium chloride sa, pregabalin, ramipril, spironolactone, and vitamin b-12.  Current Outpatient Prescriptions on File Prior to Visit  Medication Sig  . ADVAIR DISKUS 250-50 MCG/DOSE AEPB USE 1 INHALATION TWICE A DAY  . albuterol (PROVENTIL HFA;VENTOLIN HFA) 108 (90 Base) MCG/ACT inhaler ALBUTEROL, 90MCG/ACT (Inhalation Aerosol Solution)  2 PUFFS Every Day, As Needed for 0 days  Quantity: 0.00;  Refills: 0   Ordered :09-Jan-2014  Denna Haggard, MA, Anastasiya ;  Started 10-Aug-2009 Active Comments: Medication taken as needed.   Marland Kitchen aspirin 325 MG tablet Take 325 mg by mouth daily.  Marland Kitchen atorvastatin (LIPITOR) 20 MG tablet Take 20 mg by  mouth daily at 6 PM.   . brimonidine (ALPHAGAN P) 0.1 % SOLN 1 drop 2 (two) times daily.  . cholecalciferol (VITAMIN D) 1000 UNITS tablet Take 1,000 Units by mouth daily.  Marland Kitchen diltiazem (CARDIZEM) 120 MG tablet Take 120 mg by mouth 1 day or 1 dose.  . dorzolamide (TRUSOPT) 2 % ophthalmic solution   . latanoprost (XALATAN) 0.005 % ophthalmic solution 1 drop at bedtime.  . potassium chloride SA (K-DUR,KLOR-CON) 20 MEQ tablet Take 1 tablet (20 mEq total) by mouth daily.  . ramipril (ALTACE) 10 MG capsule TAKE 1 CAPSULE DAILY FOR BLOOD PRESSURE  . spironolactone (ALDACTONE) 25 MG tablet Take 1 tablet (25 mg total) by mouth daily.  . vitamin B-12 (CYANOCOBALAMIN) 500 MCG tablet Take 500 mcg by mouth daily.   No current facility-administered medications on file prior to visit.    ROS  Constitutional: Afebrile, no chills, well hydrated and well nourished Gastrointestinal: No upper or lower GI bleeding, no nausea, no vomiting and no acute GI distress Musculoskeletal: No acute joint swelling or redness, no acute loss of range of motion and no acute onset weakness Neurological: Denies any acute onset apraxia, no episodes of paralysis, no acute loss of coordination, no acute loss of consciousness and no acute onset aphasia, dysarthria, agnosia, or amnesia  Allergies  Ms. Ziska is allergic to codeine; cyclobenzaprine; and penicillins.  PFSH  Medical:  Ms. Pyka  has a past medical history of Hypertension and Glaucoma. Family: family history includes Cancer in her father and mother. Surgical:  has past surgical history that includes Back surgery; Eye surgery; and Carpal tunnel release (Left). Tobacco:  reports that she has been smoking Cigarettes.  She has a 50 pack-year smoking history. She does not have any smokeless tobacco history on file. Alcohol:  has no alcohol history on file. Drug:  has no drug history on file.  Physical Examination  Constitutional Vitals:  Today's Vitals    02/01/16 1026 02/01/16 1028  BP:  128/73  Pulse:  77  Temp:  98.4 F (36.9 C)  Resp:  18  Height: 4\' 11"  (1.499 m)   Weight: 112 lb (50.803 kg)   SpO2:  99%  PainSc: 4  4   PainLoc: Back    Calculated BMI: Body mass index is 22.61 kg/(m^2).    General appearance: alert, cooperative, appears stated age and no distress Eyes: PERLA Respiratory: No evidence respiratory distress, no audible rales or ronchi and no use of accessory muscles of respiration  Cervical Spine Exam  Inspection: Normal anatomy, no anomalies observed Cervical Lordosis: Normal Alignment: Symetrical Functional ROM: Within functional limits (WFL) AROM: WFL Sensory: No sensory abnormalities reported  Upper Extremity Exam    Right  Left  Inspection: No gross anomalies detected  Inspection: No gross anomalies detected  Functional ROM: Adequate  Functional ROM: Adequate  AROM: Adequate  AROM: Adequate  Sensory: Normal  No sensory abnormalities reported  Sensory: Normal  No sensory abnormalities reported  Motor: Unremarkable  Motor: Unremarkable  Vascular: Normal skin color, temperature, and hair growth. No peripheral edema or cyanosis  Vascular: Normal skin color, temperature, and hair growth. No peripheral edema or cyanosis   Thoracic Spine  Inspection: No gross anomalies detected Alignment: Symetrical Functional ROM: Within functional limits [facility]) AROM: Adequate Palpation: WNL  Lumbar Spine  Inspection: No gross anomalies detected Alignment: Symetrical Functional ROM: Within functional limits [facility]) AROM: Adequate Palpation: WNL Provocative Tests: Lumbar Hyperextension and rotation test: deferred Patrick's Maneuver: deferred  Gait Assessment  Gait: WNL  Lower Extremities    Right  Left  Inspection: No gross anomalies detected  Inspection: No gross anomalies detected  Functional ROM: Within functional limits [facility])  Functional ROM: Within functional limits (WFL)  AROM: Adequate  AROM: Adequate   Sensory: Normal  Sensory: Normal  Motor: Unremarkable  Motor: Unremarkable   Assessment & Plan  Primary Diagnosis & Pertinent Problem List: The primary encounter diagnosis was Chronic pain. Diagnoses of Long term prescription opiate use, Encounter for therapeutic drug level monitoring, Fibromyalgia, and Arthropathy of left hip were also pertinent to this visit.  Visit Diagnosis: 1. Chronic pain   2. Long term prescription opiate use   3. Encounter for therapeutic drug level monitoring   4. Fibromyalgia   5. Arthropathy of left hip     Problem-specific Plan(s): No problem-specific assessment & plan notes found for this encounter.   Plan of Care   Problem List Items Addressed This Visit      High   Fibromyalgia (Chronic)   Relevant Medications   pregabalin (LYRICA) 150 MG capsule   meloxicam (MOBIC) 15 MG tablet   Chronic pain - Primary (Chronic)   Relevant Medications   pregabalin (LYRICA) 150 MG capsule   HYDROcodone-acetaminophen (NORCO) 7.5-325 MG tablet   HYDROcodone-acetaminophen (NORCO) 7.5-325 MG tablet   HYDROcodone-acetaminophen (NORCO) 7.5-325 MG tablet   meloxicam (MOBIC) 15 MG tablet   Arthropathy of left hip (Chronic)   Relevant Medications   meloxicam (MOBIC) 15 MG tablet     Medium   Long term prescription opiate use (Chronic)   Encounter for therapeutic drug level monitoring       Pharmacotherapy (Medications Ordered): Meds ordered this encounter  Medications  . pregabalin (LYRICA) 150 MG capsule    Sig: Take 1 capsule (150 mg total) by mouth 4 (four) times daily.    Dispense:  360 capsule    Refill:  0    Do not place this medication, or any other  prescription from our practice, on "Automatic Refill". Patient may have prescription filled one day early if pharmacy is closed on scheduled refill date. 3 month supply.  Marland Kitchen. HYDROcodone-acetaminophen (NORCO) 7.5-325 MG tablet    Sig: Take 1 tablet by mouth every 6 (six) hours as needed for moderate pain  or severe pain.    Dispense:  120 tablet    Refill:  0    Do not place this medication, or any other prescription from our practice, on "Automatic Refill". Patient may have prescription filled one day early if pharmacy is closed on scheduled refill date. Do not fill until: 02/07/16 To last until: 03/08/16  . HYDROcodone-acetaminophen (NORCO) 7.5-325 MG tablet    Sig: Take 1 tablet by mouth every 6 (six) hours as needed for moderate pain or severe pain.    Dispense:  120 tablet    Refill:  0    Do not place this medication, or any other prescription from our practice, on "Automatic Refill". Patient may have prescription filled one day early if pharmacy is closed on scheduled refill date. Do not fill until: 03/08/16 To last until: 04/07/16  . HYDROcodone-acetaminophen (NORCO) 7.5-325 MG tablet    Sig: Take 1 tablet by mouth every 6 (six) hours as needed for moderate pain or severe pain.    Dispense:  120 tablet    Refill:  0    Do not place this medication, or any other prescription from our practice, on "Automatic Refill". Patient may have prescription filled one day early if pharmacy is closed on scheduled refill date. Do not fill until: 04/07/16 To last until: 05/07/16  . meloxicam (MOBIC) 15 MG tablet    Sig: Take 1 tablet (15 mg total) by mouth daily.    Dispense:  90 tablet    Refill:  0    Patient may have prescription filled one day early if pharmacy is closed on scheduled refill date. 3 month refill.   Lab-work & Procedure Ordered: No orders of the defined types were placed in this encounter.    Imaging Ordered: None  Interventional Therapies: Scheduled:  None at this time.    Considering:  None at this time.    PRN Procedures:  None at this time.    Referral(s) or Consult(s): None at this time.  New Prescriptions   No medications on file    Medications administered during this visit: [name] had no medications administered during this visit.  Future  Appointments Date Time Provider Department Center  05/02/2016 1:00 PM Delano MetzFrancisco Donyel Nester[facility] None    Primary Care Physician: Lorie PhenixNancy Maloney, MD [facility] Note by: Sydnee LevansFrancisco A. Laban EmperorNaveira, M.D, DABA, DABAPM, DABPM, DABIPP, FIPP  Pain Score Disclaimer: We use the NRS-11 scale. This is a self-reported, subjective measurement of pain severity with only modest accuracy. It is used primarily to identify changes within a particular patient. It must be understood that outpatient pain scales are significantly less accurate that those used for research, where they can be applied under ideal controlled circumstances with minimal exposure to variables. In reality, the score is likely to be a combination of pain intensity and pain affect, where pain affect describes the degree of emotional arousal or changes in action readiness caused by the sensory experience of pain. Factors such as social and work situation, setting, emotional state, anxiety levels, expectation, and prior pain experience may influence pain perception and show large inter-individual differences that may also be affected by time variables.

## 2016-02-01 NOTE — Progress Notes (Signed)
Safety precautions to be maintained throughout the outpatient stay will include: orient to surroundings, keep bed in low position, maintain call bell within reach at all times, provide assistance with transfer out of bed and ambulation. Hydrocodone pill count # 52/120  Filled 01-08-16

## 2016-02-04 ENCOUNTER — Other Ambulatory Visit: Payer: Self-pay | Admitting: Family Medicine

## 2016-02-04 DIAGNOSIS — K219 Gastro-esophageal reflux disease without esophagitis: Secondary | ICD-10-CM

## 2016-02-04 DIAGNOSIS — I471 Supraventricular tachycardia: Secondary | ICD-10-CM

## 2016-02-09 ENCOUNTER — Telehealth: Payer: Self-pay | Admitting: Pain Medicine

## 2016-02-09 NOTE — Telephone Encounter (Addendum)
[name] would like her Lyrica filled at [facility] and they told her it can be processed faster if called to this number [phone], please notify patient when this is completed at [phone] Please call patient and let her know status of scripts

## 2016-02-10 ENCOUNTER — Other Ambulatory Visit: Payer: Self-pay | Admitting: Family Medicine

## 2016-02-10 DIAGNOSIS — I10 Essential (primary) hypertension: Secondary | ICD-10-CM

## 2016-02-12 ENCOUNTER — Telehealth: Payer: Self-pay | Admitting: *Deleted

## 2016-02-12 NOTE — Telephone Encounter (Signed)
Call made to [facility] for existing Rx for Lyrica 150 mg capsules, written on 02/01/16 qty 360.  I have spoken with Mrs Suzanne Avila to let her know that [facility] and that she will need to bring the printed Lyrica Rx back to the office when she comes for next appt.  Patient verbalizes u/o information.

## 2016-02-19 ENCOUNTER — Other Ambulatory Visit: Payer: Self-pay

## 2016-02-19 MED ORDER — POTASSIUM CHLORIDE CRYS ER 20 MEQ PO TBCR: 20.0000 meq | EXTENDED_RELEASE_TABLET | Freq: Every day | ORAL | Status: DC

## 2016-02-19 NOTE — Telephone Encounter (Signed)
Mail order Pharmacy requesting refill. Patient was last seen on 09/12/2014. Ok to refill?

## 2016-04-13 ENCOUNTER — Other Ambulatory Visit: Payer: Self-pay | Admitting: Family Medicine

## 2016-04-13 DIAGNOSIS — J449 Chronic obstructive pulmonary disease, unspecified: Secondary | ICD-10-CM

## 2016-04-20 ENCOUNTER — Ambulatory Visit: Payer: Medicare Other | Attending: Pain Medicine | Admitting: Pain Medicine

## 2016-04-20 ENCOUNTER — Encounter: Payer: Self-pay | Admitting: Pain Medicine

## 2016-04-20 VITALS — BP 173/92 | HR 79 | Temp 97.7°F | Resp 16 | Ht 59.0 in | Wt 115.0 lb

## 2016-04-20 DIAGNOSIS — K219 Gastro-esophageal reflux disease without esophagitis: Secondary | ICD-10-CM | POA: Insufficient documentation

## 2016-04-20 DIAGNOSIS — Z5181 Encounter for therapeutic drug level monitoring: Secondary | ICD-10-CM

## 2016-04-20 DIAGNOSIS — M12859 Other specific arthropathies, not elsewhere classified, unspecified hip: Secondary | ICD-10-CM | POA: Insufficient documentation

## 2016-04-20 DIAGNOSIS — J449 Chronic obstructive pulmonary disease, unspecified: Secondary | ICD-10-CM | POA: Diagnosis not present

## 2016-04-20 DIAGNOSIS — M5416 Radiculopathy, lumbar region: Secondary | ICD-10-CM

## 2016-04-20 DIAGNOSIS — M47816 Spondylosis without myelopathy or radiculopathy, lumbar region: Secondary | ICD-10-CM | POA: Insufficient documentation

## 2016-04-20 DIAGNOSIS — E538 Deficiency of other specified B group vitamins: Secondary | ICD-10-CM | POA: Insufficient documentation

## 2016-04-20 DIAGNOSIS — R202 Paresthesia of skin: Secondary | ICD-10-CM | POA: Insufficient documentation

## 2016-04-20 DIAGNOSIS — M549 Dorsalgia, unspecified: Secondary | ICD-10-CM | POA: Diagnosis present

## 2016-04-20 DIAGNOSIS — R209 Unspecified disturbances of skin sensation: Secondary | ICD-10-CM | POA: Insufficient documentation

## 2016-04-20 DIAGNOSIS — M79602 Pain in left arm: Secondary | ICD-10-CM

## 2016-04-20 DIAGNOSIS — M797 Fibromyalgia: Secondary | ICD-10-CM

## 2016-04-20 DIAGNOSIS — M79601 Pain in right arm: Secondary | ICD-10-CM

## 2016-04-20 DIAGNOSIS — E559 Vitamin D deficiency, unspecified: Secondary | ICD-10-CM | POA: Insufficient documentation

## 2016-04-20 DIAGNOSIS — M79606 Pain in leg, unspecified: Secondary | ICD-10-CM

## 2016-04-20 DIAGNOSIS — I10 Essential (primary) hypertension: Secondary | ICD-10-CM | POA: Insufficient documentation

## 2016-04-20 DIAGNOSIS — F17210 Nicotine dependence, cigarettes, uncomplicated: Secondary | ICD-10-CM | POA: Diagnosis not present

## 2016-04-20 DIAGNOSIS — Z79891 Long term (current) use of opiate analgesic: Secondary | ICD-10-CM

## 2016-04-20 DIAGNOSIS — M25559 Pain in unspecified hip: Secondary | ICD-10-CM

## 2016-04-20 DIAGNOSIS — M961 Postlaminectomy syndrome, not elsewhere classified: Secondary | ICD-10-CM | POA: Diagnosis not present

## 2016-04-20 DIAGNOSIS — M1612 Unilateral primary osteoarthritis, left hip: Secondary | ICD-10-CM | POA: Insufficient documentation

## 2016-04-20 DIAGNOSIS — M4806 Spinal stenosis, lumbar region: Secondary | ICD-10-CM | POA: Insufficient documentation

## 2016-04-20 DIAGNOSIS — M545 Low back pain, unspecified: Secondary | ICD-10-CM

## 2016-04-20 DIAGNOSIS — M129 Arthropathy, unspecified: Secondary | ICD-10-CM

## 2016-04-20 DIAGNOSIS — G8929 Other chronic pain: Secondary | ICD-10-CM | POA: Diagnosis not present

## 2016-04-20 DIAGNOSIS — E7800 Pure hypercholesterolemia, unspecified: Secondary | ICD-10-CM | POA: Insufficient documentation

## 2016-04-20 DIAGNOSIS — F1190 Opioid use, unspecified, uncomplicated: Secondary | ICD-10-CM

## 2016-04-20 DIAGNOSIS — M539 Dorsopathy, unspecified: Secondary | ICD-10-CM

## 2016-04-20 DIAGNOSIS — M48061 Spinal stenosis, lumbar region without neurogenic claudication: Secondary | ICD-10-CM

## 2016-04-20 DIAGNOSIS — M792 Neuralgia and neuritis, unspecified: Secondary | ICD-10-CM

## 2016-04-20 MED ORDER — HYDROCODONE-ACETAMINOPHEN 7.5-325 MG PO TABS: 1.0000 | ORAL_TABLET | Freq: Four times a day (QID) | ORAL | Status: DC | PRN

## 2016-04-20 MED ORDER — MELOXICAM 15 MG PO TABS
15.0000 mg | ORAL_TABLET | Freq: Every day | ORAL | Status: DC
Start: 2016-04-20 — End: 2016-07-25

## 2016-04-20 MED ORDER — PREGABALIN 150 MG PO CAPS: 150.0000 mg | ORAL_CAPSULE | Freq: Three times a day (TID) | ORAL | Status: DC

## 2016-04-20 NOTE — Progress Notes (Signed)
Patient's Name: Suzanne Avila  Patient type: Established  MRN: [id number]  Service setting: Ambulatory outpatient  DOB: May 26, 1946  Location: [facility]  DOS: 04/20/2016  Primary Care Physician: Margarita Rana, MD  Note by: Kathlen Brunswick. Dossie Arbour, M.D, [place], [place], DABPM, Milagros Evener, [place]  Referring Physician: Margarita Rana, MD  Specialty: Board-Certified Interventional Pain Management  Last Visit to Pain Management: 02/12/2016   Primary Reason(s) for Visit: Encounter for prescription drug management (Level of risk: moderate) CC: Back Pain   HPI  [name] is a 70 y.o. year old, female patient, who returns today as an established patient. 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[place]); B12 deficiency; CAFL (chronic airflow limitation) ([place]); Narrowing of intervertebral disc space; Encounter for screening for diabetes mellitus; Acid reflux; Glaucoma; History of metabolic disorder; Benign essential HTN; Fungal infection of toenail; Osteopenia; Hypercholesterolemia without hypertriglyceridemia; Compulsive tobacco user syndrome; Absence of bladder continence; Osteoarthritis of hip (Left); Chronic hip pain (Left); Chronic lumbar radicular pain (L5 dermatome) (Location of Primary Source of Pain) (Bilateral) (L>R); Lumbar facet syndrome (Location of Primary Source of Pain) (Bilateral) (L>R); Lumbar spondylosis; 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 Her primarily concern today is the Back Pain   Pain Assessment: Self-Reported Pain Score: 3  Reported level is compatible with observation       Pain Type: Chronic pain Pain Location: Back Pain Orientation: Lower Pain Descriptors / Indicators: Aching, Sharp, Shooting, Stabbing Pain Frequency: Constant  The patient comes into the clinics today for pharmacological management of her chronic pain. I last saw this patient on 02/09/2016. The patient  has no drug history on file. Her body mass index is 23.21 kg/(m^2).  Date of Last Visit: 02/01/16 Service Provided on Last Visit: Med Refill  Controlled Substance Pharmacotherapy Assessment & REMS (Risk Evaluation and Mitigation Strategy)  Analgesic: Hydrocodone/APAP 7.5/325 one every 6 hours (30 mg per day) MME/day: 30 mg/day.  Pill Count: Hydrocodone pill count #81/120 Filled 04-08-16 Pharmacokinetics: Onset of action (Liberation/Absorption): Within expected pharmacological parameters Time to Peak effect (Distribution): Timing and results are as within normal expected parameters Duration of action (Metabolism/Excretion): Within normal limits for medication Pharmacodynamics: Analgesic Effect: More than 50% Activity Facilitation: Medication(s) allow patient to sit, stand, walk, and do the basic ADLs Perceived Effectiveness: Described as relatively effective, allowing for increase in activities of daily living (ADL) Side-effects or Adverse reactions: None reported Monitoring: [place] PMP: Online review of the past 45-monthperiod conducted. Compliant with practice rules and regulations Last UDS on record: TOXASSURE SELECT 13  Date Value Ref Range Status  11/11/2015 FINAL  Final    Comment:     ==================================================================== TOXASSURE SELECT 13 (MW) ==================================================================== Test                             Result  Flag       Units Drug Present and Declared for Prescription Verification   Hydrocodone                    1880         EXPECTED   ng/mg creat   Hydromorphone                  2209         EXPECTED   ng/mg creat   Dihydrocodeine                 383          EXPECTED   ng/mg creat   Norhydrocodone                 1214         EXPECTED   ng/mg creat    Sources of hydrocodone include scheduled prescription    medications. Hydromorphone, dihydrocodeine and norhydrocodone are    expected metabolites of hydrocodone. Hydromorphone and    dihydrocodeine are also available as scheduled prescription    medications. ==================================================================== Test                      Result    Flag   Units      Ref Range   Creatinine              35               mg/dL      >=20 ==================================================================== Declared Medications:  The flagging and interpretation on this report are based on the  following declared medications.  Unexpected results may arise from  inaccuracies in the declared medications.  **Note: The testing scope of this panel includes these medications:  Hydrocodone  **Note: The testing scope of this panel does not include following  reported medications:  Pregabalin (Lyrica) ==================================================================== For clinical consultation, please call [phone]. ====================================================================    UDS interpretation: Compliant          Medication Assessment Form: Reviewed. Patient indicates being compliant with therapy Treatment compliance: Compliant Risk Assessment: Aberrant Behavior: None observed today Substance Use Disorder (SUD)  Risk Level: Low-to-moderate Risk of opioid abuse or dependence: 0.7-3.0% with doses ? 36 MME/day and 6.1-26% with doses ? 120 MME/day. Opioid Risk Tool (ORT) Score: Total Score: 0 Low Risk for SUD (Score <3) Depression Scale Score: PHQ-2: PHQ-2 Total Score: 0 No depression (0) PHQ-9: PHQ-9 Total Score: 0 No depression (0-4)  Pharmacologic Plan: No change in therapy, at this time  Laboratory Chemistry  Inflammation Markers Lab Results  Component Value Date   ESRSEDRATE 23 11/11/2015   CRP <0.5 11/11/2015    Renal Function Lab Results  Component Value Date   BUN 10 11/11/2015   CREATININE 0.64 11/11/2015   GFRAA >60 11/11/2015   GFRNONAA >60 11/11/2015    Hepatic Function Lab Results  Component Value Date   AST 22 11/11/2015   ALT 17 11/11/2015   ALBUMIN 4.1 11/11/2015    Electrolytes Lab Results  Component Value Date   NA 136 11/11/2015   K 3.7 11/11/2015   CL 99* 11/11/2015   CALCIUM 9.4 11/11/2015   MG 1.8 11/11/2015    Pain Modulating Vitamins No results found for: [place], [id number], [id number], [id number], 25OHVITD1, 25OHVITD2, 25OHVITD3, VITAMINB12  Coagulation Parameters Lab Results  Component Value Date   PLT 173 03/22/2014    Note: Labs Reviewed.  Recent Diagnostic  Imaging  US Abdomen Limited  09/22/2014  * PRIOR REPORT IMPORTED FROM AN EXTERNAL SYSTEM * CLINICAL DATA:  Elevated liver enzymes EXAM: US ABDOMEN LIMITED - RIGHT UPPER QUADRANT COMPARISON:  None. FINDINGS: Gallbladder: No gallstones or wall thickening visualized. There is no pericholecystic fluid. No sonographic Murphy sign noted. Common bile duct: Diameter: 6 mm. There is no intrahepatic or extrahepatic biliary duct dilatation. Liver: No focal lesion identified. Liver echogenicity is mildly increased and inhomogeneous. IMPRESSION: Liver echogenicity is mildly increased and inhomogeneous. This appearance is most likely due to hepatic steatosis. While no focal liver lesions are identified,  it must be cautioned that the sensitivity of ultrasound for focal liver lesions is diminished in this circumstance. Study otherwise unremarkable. Electronically Signed   By: Lowella Grip M.D.   On: 09/22/2014 09:19    Lumbosacral Imaging: Lumbar MR wo contrast:  Results for orders placed during the hospital encounter of 04/09/05  MR Lumbar Spine Wo Contrast   Narrative Clinical Data: Leg weakness. Back pain.   MRI LUMBAR SPINE WITHOUT CONTRAST:  Technique: Multiplanar and multiecho pulse sequences of the lumbar spine, to include the lower thoracic and upper sacral regions, were obtained according to standard protocol without IV contrast.  Comparison: None.   Normal alignment and no fracture. The conus medullaris is normal.   L1-2: Mild disc bulging.   L2-3: Mild disc bulging.   L3-4: Small central disc protrusion without spinal stenosis.   L4-5: Broad based disc protrusion is present. There is facet and ligamentum flavum hypertrophy and moderate spinal stenosis. Neural foramina are narrowed left greater than right.   L5-S1: Disc bulging and early spondylosis are present. There is mild facet arthropathy. There is a 6 mm synovial cyst projecting dorsally from the facet on the left. This does not enter the neural canal.   IMPRESSION:  Multilevel lumbar degenerative changes. Diffuse disc protrusion and facet disease contribute to moderate spinal stenosis at L4-5.  Provider: Lavonia Dana   Lumbar MR wo contrast:  Results for orders placed in visit on 05/05/09  MR L Spine Ltd W/O Cm   Narrative * PRIOR REPORT IMPORTED FROM AN EXTERNAL SYSTEM *   PRIOR REPORT IMPORTED FROM THE SYNGO [place] EXAM:    Back pain  COMMENTS:  [id number]   PROCEDURE:     MMR - MMR LUMBAR SPINE WO CONTRAST  - May 05 2009 11:50AM   RESULT:     Multiplanar/multisequence imaging of the lumbar spine is  obtained without administration of Gadolinium.   The conus medullaris terminates at L1-2  level. The cauda equina  demonstrate  no evidence of clumping or thickening.   The T12-L1 and L1-2 levels demonstrate no evidence of thecal sac stenosis  or  neural foraminal narrowing.   At the L2-3 level a mild, broad-based disc bulge is appreciated  contributing  to mild, multifactorial thecal sac stenosis. There is no evidence of  significant neural foraminal narrowing.   At the L3-4 disc space level a broad-based disc bulge contributes to  multifactorial moderate thecal sac stenosis. There is lateralization which  contributes to mild neural foraminal narrowing on the left and possible  exiting nerve root compromise.   At the L4-5 disc space level, multifactorial severe thecal sac stenosis is  appreciated secondary to ligamentum flavum and facet hypertrophy as well  as  a broad-based disc bulge. There is lateralization to the left which  contributes to severe multifactorial neural foraminal narrowing and  exiting  nerve root compression. Moderate neural foraminal narrowing is appreciated  on the right with exiting nerve root compromise.   At the L5-S1 level a mild, broad-based disc bulge is appreciated  demonstrating lateralization to the left with resulting severe neural  foraminal narrowing within the mid to distal portion of the neural  foramen.  There is no evidence of neural foraminal narrowing on the right.   IMPRESSION:     Severe thecal sac stenosis at the L4-5 disc space level as  well as exiting nerve root compression on the left and possible compromise  on the right. Mild to moderate stenosis is appreciated at the L3-4 level  with possible exiting nerve root compromise. This study was ordered by an  orthopedic surgeon and thus, surgical consultation has been obtained.   Thank you for this opportunity to contribute to the care of your patient.       Lumbar CT wo contrast:  Results for orders placed in visit on 11/08/10  CT Lumbar Spine Wo Contrast    Narrative * PRIOR REPORT IMPORTED FROM AN EXTERNAL SYSTEM *   PRIOR REPORT IMPORTED FROM THE SYNGO WORKFLOW SYSTEM   REASON FOR EXAM:    low back pain  COMMENTS:   PROCEDURE:     KCT - KCT LUMBAR SPINE WO CONTRAST  - Nov 08 2010 11:30AM   RESULT:   FINDINGS: Multislice helical acquisition through the lumbar spine  demonstrates severe disc space narrowing at the L3-4 level with end-plate  sclerosis on either side and vacuum disc phenomenon changes at this level.  There is a diffuse annular disc bulge at the L2-3 level causing spinal  canal  narrowing down to 7.0 mm anterior to posterior in the midline. Surgical  consultation is recommended. Laminectomy changes are noted at L4 and L5.  There is diffuse disc bulge present at L3-4. A donor site appears to be  present over the posterior aspect of the right iliac region. No  compression  deformity is evident. There is a diffuse disc bulge at L1-2 as well.  Atherosclerotic calcification is present within the aorta.   IMPRESSION:      Multilevel degenerative disc changes with areas of disc  protrusion and spinal canal stenosis. Post operative changes as described.   Thank you for this opportunity to contribute to the care of your patient.       Lumbar DG 2-3 views:  Results for orders placed in visit on 07/22/10  DG Lumbar Spine 2-3 Views   Narrative * PRIOR REPORT IMPORTED FROM AN EXTERNAL SYSTEM *   PRIOR REPORT IMPORTED FROM THE SYNGO WORKFLOW SYSTEM   REASON FOR EXAM:    lumbago  COMMENTS:   PROCEDURE:     KDR - KDXR LUMBAR SPINE AP AND LATERAL  - Jul 22 2010  11:53AM   RESULT:     Images of the lumbar spine demonstrate posterior stabilization  from L4 to S1 with rods and pedicle screws present. L3-L4 severe disc  space  narrowing is present. There is grade 1 anterolisthesis of L4 on L5. There  is  no compression deformity or acute bony abnormality. Atherosclerotic  calcification is present.   IMPRESSION:       Degenerative and postoperative changes as described.   Thank you for the opportunity to contribute to the care of your patient.       Note: Imaging reviewed.   Meds  The patient has a current medication list which includes the following prescription(s): advair diskus,  albuterol, aspirin, atorvastatin, brimonidine, cholecalciferol, diltiazem, dorzolamide, hydrocodone-acetaminophen, hydrocodone-acetaminophen, hydrocodone-acetaminophen, latanoprost, meloxicam, nexium, potassium chloride sa, pregabalin, ramipril, spironolactone, and vitamin b-12.  Current Outpatient Prescriptions on File Prior to Visit  Medication Sig  . ADVAIR DISKUS 250-50 MCG/DOSE AEPB USE 1 INHALATION TWICE A DAY  . albuterol (PROVENTIL HFA;VENTOLIN HFA) 108 (90 Base) MCG/ACT inhaler ALBUTEROL, 90MCG/ACT (Inhalation Aerosol Solution)  2 PUFFS Every Day, As Needed for 0 days  Quantity: 0.00;  Refills: 0   Ordered :09-Jan-2014  Celene Kras, MA, [place] ;  Started 10-Aug-2009 Active Comments: Medication taken as needed.   Marland Kitchen aspirin 325 MG tablet Take 325 mg by mouth daily.  Marland Kitchen atorvastatin (LIPITOR) 20 MG tablet Take 20 mg by mouth daily at 6 PM.   . brimonidine (ALPHAGAN P) 0.1 % SOLN 1 drop 2 (two) times daily.  . cholecalciferol (VITAMIN D) 1000 UNITS tablet Take 1,000 Units by mouth daily.  Marland Kitchen diltiazem (CARDIZEM) 120 MG tablet Take 120 mg by mouth 1 day or 1 dose.  . dorzolamide (TRUSOPT) 2 % ophthalmic solution   . latanoprost (XALATAN) 0.005 % ophthalmic solution 1 drop at bedtime.  Marland Kitchen NEXIUM 40 MG capsule TAKE 1 CAPSULE DAILY FOR REFLUX  . potassium chloride SA (K-DUR,KLOR-CON) 20 MEQ tablet Take 1 tablet (20 mEq total) by mouth daily.  . ramipril (ALTACE) 10 MG capsule TAKE 1 CAPSULE DAILY FOR BLOOD PRESSURE  . spironolactone (ALDACTONE) 25 MG tablet Take 1 tablet (25 mg total) by mouth daily.  . vitamin B-12 (CYANOCOBALAMIN) 500 MCG tablet Take 500 mcg by mouth daily.   No current facility-administered  medications on file prior to visit.    ROS  Constitutional: Denies any fever or chills Gastrointestinal: No reported hemesis, hematochezia, vomiting, or acute GI distress Musculoskeletal: Denies any acute onset joint swelling, redness, loss of ROM, or weakness Neurological: No reported episodes of acute onset apraxia, aphasia, dysarthria, agnosia, amnesia, paralysis, loss of coordination, or loss of consciousness  Allergies  Ms. Bury is allergic to codeine; cyclobenzaprine; and penicillins.  [place]  Medical:  Ms. Purifoy  has a past medical history of Hypertension and Glaucoma. Family: family history includes Cancer in her father and mother. Surgical:  has past surgical history that includes Back surgery; Eye surgery; and Carpal tunnel release (Left). Tobacco:  reports that she has been smoking Cigarettes.  She has a 50 pack-year smoking history. She does not have any smokeless tobacco history on file. Alcohol:  has no alcohol history on file. Drug:  has no drug history on file.  Constitutional Exam  Vitals: Blood pressure 173/92, pulse 79, temperature 97.7 F (36.5 C), resp. rate 16, height '4\' 11"'$  (1.499 m), weight 115 lb (52.164 kg), SpO2 91 %. General appearance: Well nourished, well developed, and well hydrated. In no acute distress Calculated BMI/Body habitus: Body mass index is 23.21 kg/(m^2). (18.5-24.9 kg/m2) Ideal body weight Psych/Mental status: Alert and oriented x 3 (person, place, & time) Eyes: PERLA Respiratory: No evidence of acute respiratory distress  Cervical Spine Exam  Inspection: No masses, redness, or swelling Alignment: Symmetrical ROM: Functional: ROM is within functional limits [facility]) Stability: No instability detected Muscle strength & Tone: Functionally intact Sensory: Unimpaired Palpation: No complaints of tenderness  Upper Extremity (UE) Exam    Side: Right upper extremity  Side: Left upper extremity  Inspection: No masses, redness, swelling,  or asymmetry  Inspection: No masses, redness, swelling, or asymmetry  ROM:  ROM:  Functional: ROM is within functional limits [facility])  Functional: ROM is within functional  limits [facility])  Muscle strength & Tone: Functionally intact  Muscle strength & Tone: Functionally intact  Sensory: Unimpaired  Sensory: Unimpaired  Palpation: Non-contributory  Palpation: Non-contributory   Thoracic Spine Exam  Inspection: No masses, redness, or swelling Alignment: Symmetrical ROM: Functional: ROM is within functional limits [facility]) Stability: No instability detected Sensory: Unimpaired Muscle strength & Tone: Functionally intact Palpation: No complaints of tenderness  Lumbar Spine Exam  Inspection: No masses, redness, or swelling Alignment: Symmetrical ROM: Functional: ROM is within functional limits [facility]) Stability: No instability detected Muscle strength & Tone: Functionally intact Sensory: Unimpaired Palpation: No complaints of tenderness Provocative Tests: Lumbar Hyperextension and rotation test: deferred Patrick's Maneuver: deferred  Gait & Posture Assessment  Ambulation: Unassisted Gait: Unaffected Posture: WNL  Lower Extremity Exam    Side: Right lower extremity  Side: Left lower extremity  Inspection: No masses, redness, swelling, or asymmetry ROM:  Inspection: No masses, redness, swelling, or asymmetry ROM:  Functional: ROM is within functional limits [facility])  Functional: ROM is within functional limits [facility])  Muscle strength & Tone: Functionally intact  Muscle strength & Tone: Functionally intact  Sensory: Unimpaired  Sensory: Unimpaired  Palpation: Non-contributory  Palpation: Non-contributory   Assessment & Plan  Primary Diagnosis & Pertinent Problem List: The primary encounter diagnosis was Chronic pain. Diagnoses of Encounter for therapeutic drug level monitoring, Long term current use of opiate analgesic, Disturbance of skin sensation, B12 deficiency, Vitamin D deficiency,  Arthropathy of left hip, Fibromyalgia, Neurogenic pain, Chronic pain of lower extremity, unspecified laterality, Chronic pain of both upper extremities, Lumbar central spinal stenosis, Lumbar facet syndrome (Location of Primary Source of Pain) (Bilateral) (L>R), Lumbar Postlaminectomy syndrome, Failed back surgical syndrome, Chronic hip pain, unspecified laterality, Chronic low back pain (Location of Secondary source of pain) (Bilateral) (L>R), Chronic lumbar radicular pain (L5 dermatome) (Location of Primary Source of Pain) (Bilateral) (L>R), Primary osteoarthritis of left hip, Lumbar spondylosis, unspecified spinal osteoarthritis, and Opiate use (30 MME/Day) were also pertinent to this visit.  Visit Diagnosis: 1. Chronic pain   2. Encounter for therapeutic drug level monitoring   3. Long term current use of opiate analgesic   4. Disturbance of skin sensation   5. B12 deficiency   6. Vitamin D deficiency   7. Arthropathy of left hip   8. Fibromyalgia   9. Neurogenic pain   10. Chronic pain of lower extremity, unspecified laterality   11. Chronic pain of both upper extremities   12. Lumbar central spinal stenosis   13. Lumbar facet syndrome (Location of Primary Source of Pain) (Bilateral) (L>R)   14. Lumbar Postlaminectomy syndrome   15. Failed back surgical syndrome   16. Chronic hip pain, unspecified laterality   17. Chronic low back pain (Location of Secondary source of pain) (Bilateral) (L>R)   18. Chronic lumbar radicular pain (L5 dermatome) (Location of Primary Source of Pain) (Bilateral) (L>R)   19. Primary osteoarthritis of left hip   20. Lumbar spondylosis, unspecified spinal osteoarthritis   21. Opiate use (30 MME/Day)     Problems updated and reviewed during this visit: Problem  Chronic lower extremity pain (Location of Primary Source of Pain) (Bilateral) (L>R)  Chronic upper extremity pain (Location of Tertiary source of pain) (Bilateral) (L>R)  Failed back surgical syndrome  (L4-5)  Chronic low back pain (Location of Secondary source of pain) (Bilateral) (L>R)  Osteoarthritis of hip (Left)  Chronic hip pain (Left)  Chronic lumbar radicular pain (L5 dermatome) (Location of Primary Source of Pain) (Bilateral) (L>R)  Lumbar facet syndrome (Location of Primary Source of Pain) (Bilateral) (L>R)  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  No new assessment & plan notes have been filed under this hospital service since the last note was generated. Service: Pain Management   Plan of Care   Problem List Items Addressed This Visit      High   Chronic hip pain (Bilateral) (L>R) (Chronic)   Relevant Medications   meloxicam (MOBIC) 15 MG tablet   HYDROcodone-acetaminophen (NORCO) 7.5-325 MG tablet   HYDROcodone-acetaminophen (NORCO) 7.5-325 MG tablet   HYDROcodone-acetaminophen (NORCO) 7.5-325 MG tablet   pregabalin (LYRICA) 150 MG capsule   Other Relevant Orders   HIP INJECTION   Chronic low back pain (Location of Secondary source of pain) (Bilateral) (L>R) (Chronic)   Relevant Medications   meloxicam (MOBIC) 15 MG tablet   HYDROcodone-acetaminophen (NORCO) 7.5-325 MG tablet   HYDROcodone-acetaminophen (NORCO) 7.5-325 MG tablet   HYDROcodone-acetaminophen (NORCO) 7.5-325 MG tablet   Other Relevant Orders   LUMBAR FACET(MEDIAL BRANCH NERVE BLOCK) MBNB   Chronic lower extremity pain (Location of Primary Source of Pain) (Bilateral) (L>R) (Chronic)   Relevant Orders   LUMBAR EPIDURAL STEROID INJECTION   LUMBAR EPIDURAL STEROID INJECTION   Lumbar Transforaminal epidural without steroid   Chronic lumbar radicular pain (L5 dermatome) (Location of Primary Source of Pain) (Bilateral) (L>R) (Chronic)   Relevant Orders    LUMBAR EPIDURAL STEROID INJECTION   Lumbar Transforaminal epidural without steroid   Chronic pain - Primary (Chronic)   Relevant Medications   meloxicam (MOBIC) 15 MG tablet   HYDROcodone-acetaminophen (NORCO) 7.5-325 MG tablet   HYDROcodone-acetaminophen (NORCO) 7.5-325 MG tablet   HYDROcodone-acetaminophen (NORCO) 7.5-325 MG tablet   pregabalin (LYRICA) 150 MG capsule   Chronic upper extremity pain (Location of Tertiary source of pain) (Bilateral) (L>R) (Chronic)   Relevant Medications   meloxicam (MOBIC) 15 MG tablet   HYDROcodone-acetaminophen (NORCO) 7.5-325 MG tablet   HYDROcodone-acetaminophen (NORCO) 7.5-325 MG tablet   HYDROcodone-acetaminophen (NORCO) 7.5-325 MG tablet   pregabalin (LYRICA) 150 MG capsule   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) (Chronic)   Relevant Medications   meloxicam (MOBIC) 15 MG tablet   HYDROcodone-acetaminophen (NORCO) 7.5-325 MG tablet   HYDROcodone-acetaminophen (NORCO) 7.5-325 MG tablet   HYDROcodone-acetaminophen (NORCO) 7.5-325 MG tablet   Other Relevant Orders   LUMBAR EPIDURAL STEROID INJECTION   Fibromyalgia (Chronic)   Relevant Medications   meloxicam (MOBIC) 15 MG tablet   pregabalin (LYRICA) 150 MG capsule   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)   Relevant Orders   LUMBAR EPIDURAL STEROID INJECTION   Lumbar Transforaminal epidural without steroid   Lumbar facet syndrome (Location of Primary Source of Pain) (Bilateral) (L>R) (Chronic)   Relevant Medications   meloxicam (MOBIC) 15 MG tablet   HYDROcodone-acetaminophen (NORCO) 7.5-325 MG tablet   HYDROcodone-acetaminophen (NORCO) 7.5-325 MG tablet   HYDROcodone-acetaminophen (NORCO) 7.5-325 MG tablet   Other Relevant Orders   LUMBAR FACET(MEDIAL BRANCH NERVE BLOCK) MBNB   Lumbar Postlaminectomy syndrome (L4-5) (Chronic)   Relevant Orders   LUMBAR EPIDURAL STEROID INJECTION   Lumbar spondylosis (Chronic)   Relevant Medications   meloxicam (MOBIC) 15 MG tablet    HYDROcodone-acetaminophen (NORCO) 7.5-325 MG tablet   HYDROcodone-acetaminophen (NORCO) 7.5-325 MG tablet  HYDROcodone-acetaminophen (NORCO) 7.5-325 MG tablet   Other Relevant Orders   LUMBAR FACET(MEDIAL BRANCH NERVE BLOCK) MBNB   Neurogenic pain (Chronic)   Osteoarthritis of hip (Left) (Chronic)   Relevant Medications   meloxicam (MOBIC) 15 MG tablet   HYDROcodone-acetaminophen (NORCO) 7.5-325 MG tablet   HYDROcodone-acetaminophen (NORCO) 7.5-325 MG tablet   HYDROcodone-acetaminophen (NORCO) 7.5-325 MG tablet   Other Relevant Orders   HIP INJECTION     Medium   Encounter for therapeutic drug level monitoring   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 meloxicam (MOBIC) 15 MG tablet    Sig: Take 1 tablet (15 mg total) by mouth daily.    Dispense:  90 tablet    Refill:  2    Patient may have prescription filled one day early if pharmacy is closed on scheduled refill date. 3 month refill.  . HYDROcodone-acetaminophen (NORCO) 7.5-325 MG tablet    Sig: Take 1 tablet by mouth every 6 (six) hours as needed for severe pain.    Dispense:  120 tablet    Refill:  0    Do not place this medication, or any other prescription from our practice, on "Automatic Refill". Patient may have prescription filled one day early if pharmacy is closed on scheduled refill date. Do not fill until: 05/07/16 To last until: 06/06/16  .  HYDROcodone-acetaminophen (NORCO) 7.5-325 MG tablet    Sig: Take 1 tablet by mouth every 6 (six) hours as needed for severe pain.    Dispense:  120 tablet    Refill:  0    Do not place this medication, or any other prescription from our practice, on "Automatic Refill". Patient may have prescription filled one day early if pharmacy is closed on scheduled refill date. Do not fill until: 06/06/16 To last until: 07/06/16  . HYDROcodone-acetaminophen (NORCO) 7.5-325 MG tablet    Sig: Take 1 tablet by mouth every 6 (six) hours as needed for severe pain.    Dispense:  120 tablet    Refill:  0    Do not place this medication, or any other prescription from our practice, on "Automatic Refill". Patient may have prescription filled one day early if pharmacy is closed on scheduled refill date. Do not fill until: 07/06/16 To last until: 08/05/16  . pregabalin (LYRICA) 150 MG capsule    Sig: Take 1 capsule (150 mg total) by mouth every 8 (eight) hours.    Dispense:  270 capsule    Refill:  0    Do not place this medication, or any other prescription from our practice, on "Automatic Refill". Patient may have prescription filled one day early if pharmacy is closed on scheduled refill date. 3 month supply.    Lab-work & Procedure Ordered: Orders Placed This Encounter  Procedures  . HIP INJECTION  . LUMBAR FACET(MEDIAL BRANCH NERVE BLOCK) MBNB  . LUMBAR EPIDURAL STEROID INJECTION  . LUMBAR EPIDURAL STEROID INJECTION  . Lumbar Transforaminal epidural without steroid  . ToxASSURE Select 13 (MW), Urine  . 25-Hydroxyvitamin D Lcms D2+D3  . Vitamin B12    Imaging Ordered: None  Interventional Therapies: Scheduled:  None at this time.    Considering:   1. Diagnostic bilateral intra-articular hip joint injection under fluoroscopic guidance, without without sedation.  2. Possible bilateral hip joint radiofrequency ablation.  3. Diagnostic bilateral lumbar facet block under fluoroscopic guidance and  IV sedation.  4. Possible bilateral lumbar facet radiofrequency ablation under fluoroscopic guidance and IV sedation.  5. Diagnostic bilateral L4-L5 transforaminal epidural steroid injection under fluoroscopic guidance, with or without sedation.  6. Diagnostic caudal epidural steroid injection under fluoroscopic guidance, with or without sedation.  7. Diagnostic caudal epidurogram under fluoroscopic guidance and an IV sedation.  8. Possible right ask epidural lysis of adhesions.  9. Diagnostic left-sided L2-3 lumbar epidural steroid injection under fluoroscopic guidance, with or without sedation.  10. Possible bilateral  Lumbar spinal cord stimulator trial implant.    PRN Procedures:   1. Diagnostic bilateral intra-articular hip joint injection under fluoroscopic guidance, without without sedation.  2. Diagnostic bilateral lumbar facet block under fluoroscopic guidance and IV sedation.  3. Diagnostic bilateral L4-L5 transforaminal epidural steroid injection under fluoroscopic guidance, with or without sedation.  4. Diagnostic caudal epidural steroid injection under fluoroscopic guidance, with or without sedation.  5. Diagnostic caudal epidurogram under fluoroscopic guidance and an IV sedation.  6. Diagnostic left-sided L2-3 lumbar epidural steroid injection under fluoroscopic guidance, with or without sedation.    Referral(s) or Consult(s): None at this time.  New Prescriptions   No medications on file    Medications administered during this visit: Ms. Even had no medications administered during this visit.  Requested PM Follow-up: Return in about 3 months (around 07/25/2016) for Medication Management, (3-Mo), Procedure (PRN - Patient will call).  Future Appointments Date Time Provider [place]  07/25/2016 9:40 AM Milinda Pointer, MD [facility] None    Primary Care Physician: Margarita Rana, MD Location: [facility] Note by: Kathlen Brunswick.  Dossie Arbour, M.D, DABA, DABAPM, DABPM, DABIPP, FIPP  Pain Score Disclaimer: We use the NRS-11 scale. This is a self-reported, subjective measurement of pain severity with only modest accuracy. It is used primarily to identify changes within a particular patient. It must be understood that outpatient pain scales are significantly less accurate that those used for research, where they can be applied under ideal controlled circumstances with minimal exposure to variables. In reality, the score is likely to be a combination of pain intensity and pain affect, where pain affect describes the degree of emotional arousal or changes in action readiness caused by the sensory experience of pain. Factors such as social and work situation, setting, emotional state, anxiety levels, expectation, and prior pain experience may influence pain perception and show large inter-individual differences that may also be affected by time variables.  Patient instructions provided during this appointment: Patient Instructions  Smoking Cessation, Tips for Success If you are ready to quit smoking, congratulations! You have chosen to help yourself be healthier. Cigarettes bring nicotine, tar, carbon monoxide, and other irritants into your body. Your lungs, heart, and blood vessels will be able to work better without these poisons. There are many different ways to quit smoking. Nicotine gum, nicotine patches, a nicotine inhaler, or nicotine nasal  spray can help with physical craving. Hypnosis, support groups, and medicines help break the habit of smoking. WHAT THINGS CAN I DO TO MAKE QUITTING EASIER?  Here are some tips to help you quit for good:  Pick a date when you will quit smoking completely. Tell all of your friends and family about your plan to quit on that date.  Do not try to slowly cut down on the number of cigarettes you are smoking. Pick a quit date and quit smoking completely starting on that day.  Throw away all  cigarettes.   Clean and remove all ashtrays from your home, work, and car.  On a card, write down your reasons for quitting. Carry the card with you and read it when you get the urge to smoke.  Cleanse your body of nicotine. Drink enough water and fluids to keep your urine clear or pale yellow. Do this after quitting to flush the nicotine from your body.  Learn to predict your moods. Do not let a bad situation be your excuse to have a cigarette. Some situations in your life might tempt you into wanting a cigarette.  Never have "just one" cigarette. It leads to wanting another and another. Remind yourself of your decision to quit.  Change habits associated with smoking. If you smoked while driving or when feeling stressed, try other activities to replace smoking. Stand up when drinking your coffee. Brush your teeth after eating. Sit in a different chair when you read the paper. Avoid alcohol while trying to quit, and try to drink fewer caffeinated beverages. Alcohol and caffeine may urge you to smoke.  Avoid foods and drinks that can trigger a desire to smoke, such as sugary or spicy foods and alcohol.  Ask people who smoke not to smoke around you.  Have something planned to do right after eating or having a cup of coffee. For example, plan to take a walk or exercise.  Try a relaxation exercise to calm you down and decrease your stress. Remember, you may be tense and nervous for the first 2 weeks after you quit, but this will pass.  Find new activities to keep your hands busy. Play with a pen, coin, or rubber band. Doodle or draw things on paper.  Brush your teeth right after eating. This will help cut down on the craving for the taste of tobacco after meals. You can also try mouthwash.   Use oral substitutes in place of cigarettes. Try using lemon drops, carrots, cinnamon sticks, or chewing gum. Keep them handy so they are available when you have the urge to smoke.  When you have the  urge to smoke, try deep breathing.  Designate your home as a nonsmoking area.  If you are a heavy smoker, ask your health care provider about a prescription for nicotine chewing gum. It can ease your withdrawal from nicotine.  Reward yourself. Set aside the cigarette money you save and buy yourself something nice.  Look for support from others. Join a support group or smoking cessation program. Ask someone at home or at work to help you with your plan to quit smoking.  Always ask yourself, "Do I need this cigarette or is this just a reflex?" Tell yourself, "Today, I choose not to smoke," or "I do not want to smoke." You are reminding yourself of your decision to quit.  Do not replace cigarette smoking with electronic cigarettes (commonly called e-cigarettes). The safety of e-cigarettes is unknown, and some may contain harmful  chemicals.  If you relapse, do not give up! Plan ahead and think about what you will do the next time you get the urge to smoke. HOW WILL I FEEL WHEN I QUIT SMOKING? You may have symptoms of withdrawal because your body is used to nicotine (the addictive substance in cigarettes). You may crave cigarettes, be irritable, feel very hungry, cough often, get headaches, or have difficulty concentrating. The withdrawal symptoms are only temporary. They are strongest when you first quit but will go away within 10-14 days. When withdrawal symptoms occur, stay in control. Think about your reasons for quitting. Remind yourself that these are signs that your body is healing and getting used to being without cigarettes. Remember that withdrawal symptoms are easier to treat than the major diseases that smoking can cause.  Even after the withdrawal is over, expect periodic urges to smoke. However, these cravings are generally short lived and will go away whether you smoke or not. Do not smoke! WHAT RESOURCES ARE AVAILABLE TO HELP ME QUIT SMOKING? Your health care provider can direct you to  community resources or hospitals for support, which may include:  Group support.  Education.  Hypnosis.  Therapy.   This information is not intended to replace advice given to you by your health care provider. Make sure you discuss any questions you have with your health care provider.   Document Released: 07/08/2004 Document Revised: 10/31/2014 Document Reviewed: 03/28/2013 Elsevier Interactive Patient Education [employer].

## 2016-04-20 NOTE — Progress Notes (Signed)
Safety precautions to be maintained throughout the outpatient stay will include: orient to surroundings, keep bed in low position, maintain call bell within reach at all times, provide assistance with transfer out of bed and ambulation. Hydrocodone pill count #81/120  Filled 04-08-16

## 2016-04-20 NOTE — Patient Instructions (Signed)
Smoking Cessation, Tips for Success If you are ready to quit smoking, congratulations! You have chosen to help yourself be healthier. Cigarettes bring nicotine, tar, carbon monoxide, and other irritants into your body. Your lungs, heart, and blood vessels will be able to work better without these poisons. There are many different ways to quit smoking. Nicotine gum, nicotine patches, a nicotine inhaler, or nicotine nasal spray can help with physical craving. Hypnosis, support groups, and medicines help break the habit of smoking. WHAT THINGS CAN I DO TO MAKE QUITTING EASIER?  Here are some tips to help you quit for good:  Pick a date when you will quit smoking completely. Tell all of your friends and family about your plan to quit on that date.  Do not try to slowly cut down on the number of cigarettes you are smoking. Pick a quit date and quit smoking completely starting on that day.  Throw away all cigarettes.   Clean and remove all ashtrays from your home, work, and car.  On a card, write down your reasons for quitting. Carry the card with you and read it when you get the urge to smoke.  Cleanse your body of nicotine. Drink enough water and fluids to keep your urine clear or pale yellow. Do this after quitting to flush the nicotine from your body.  Learn to predict your moods. Do not let a bad situation be your excuse to have a cigarette. Some situations in your life might tempt you into wanting a cigarette.  Never have "just one" cigarette. It leads to wanting another and another. Remind yourself of your decision to quit.  Change habits associated with smoking. If you smoked while driving or when feeling stressed, try other activities to replace smoking. Stand up when drinking your coffee. Brush your teeth after eating. Sit in a different chair when you read the paper. Avoid alcohol while trying to quit, and try to drink fewer caffeinated beverages. Alcohol and caffeine may urge you to  smoke.  Avoid foods and drinks that can trigger a desire to smoke, such as sugary or spicy foods and alcohol.  Ask people who smoke not to smoke around you.  Have something planned to do right after eating or having a cup of coffee. For example, plan to take a walk or exercise.  Try a relaxation exercise to calm you down and decrease your stress. Remember, you may be tense and nervous for the first 2 weeks after you quit, but this will pass.  Find new activities to keep your hands busy. Play with a pen, coin, or rubber band. Doodle or draw things on paper.  Brush your teeth right after eating. This will help cut down on the craving for the taste of tobacco after meals. You can also try mouthwash.   Use oral substitutes in place of cigarettes. Try using lemon drops, carrots, cinnamon sticks, or chewing gum. Keep them handy so they are available when you have the urge to smoke.  When you have the urge to smoke, try deep breathing.  Designate your home as a nonsmoking area.  If you are a heavy smoker, ask your health care provider about a prescription for nicotine chewing gum. It can ease your withdrawal from nicotine.  Reward yourself. Set aside the cigarette money you save and buy yourself something nice.  Look for support from others. Join a support group or smoking cessation program. Ask someone at home or at work to help you with your plan   to quit smoking.  Always ask yourself, "Do I need this cigarette or is this just a reflex?" Tell yourself, "Today, I choose not to smoke," or "I do not want to smoke." You are reminding yourself of your decision to quit.  Do not replace cigarette smoking with electronic cigarettes (commonly called e-cigarettes). The safety of e-cigarettes is unknown, and some may contain harmful chemicals.  If you relapse, do not give up! Plan ahead and think about what you will do the next time you get the urge to smoke. HOW WILL I FEEL WHEN I QUIT SMOKING? You  may have symptoms of withdrawal because your body is used to nicotine (the addictive substance in cigarettes). You may crave cigarettes, be irritable, feel very hungry, cough often, get headaches, or have difficulty concentrating. The withdrawal symptoms are only temporary. They are strongest when you first quit but will go away within 10-14 days. When withdrawal symptoms occur, stay in control. Think about your reasons for quitting. Remind yourself that these are signs that your body is healing and getting used to being without cigarettes. Remember that withdrawal symptoms are easier to treat than the major diseases that smoking can cause.  Even after the withdrawal is over, expect periodic urges to smoke. However, these cravings are generally short lived and will go away whether you smoke or not. Do not smoke! WHAT RESOURCES ARE AVAILABLE TO HELP ME QUIT SMOKING? Your health care provider can direct you to community resources or hospitals for support, which may include:  Group support.  Education.  Hypnosis.  Therapy.   This information is not intended to replace advice given to you by your health care provider. Make sure you discuss any questions you have with your health care provider.   Document Released: 07/08/2004 Document Revised: 10/31/2014 Document Reviewed: 03/28/2013 Elsevier Interactive Patient Education 2016 [employer] Inc.  

## 2016-04-25 DIAGNOSIS — G8929 Other chronic pain: Secondary | ICD-10-CM | POA: Insufficient documentation

## 2016-04-25 DIAGNOSIS — M79605 Pain in left leg: Secondary | ICD-10-CM

## 2016-04-25 DIAGNOSIS — M79601 Pain in right arm: Secondary | ICD-10-CM

## 2016-04-25 DIAGNOSIS — M79604 Pain in right leg: Secondary | ICD-10-CM

## 2016-04-25 DIAGNOSIS — M79602 Pain in left arm: Secondary | ICD-10-CM

## 2016-04-28 LAB — TOXASSURE SELECT 13 (MW), URINE: PDF: 0

## 2016-05-02 ENCOUNTER — Encounter: Payer: Medicare Other | Admitting: Pain Medicine

## 2016-05-02 NOTE — Progress Notes (Signed)
Quick Note:  NOTE: This forensic urine drug screen (UDS) test was conducted using a state-of-the-art ultra high performance liquid chromatography and mass spectrometry system (UPLC/MS-MS), the most sophisticated and accurate method available. UPLC/MS-MS is 1,000 times more precise and accurate than standard gas chromatography and mass spectrometry (GC/MS). This system can analyze 26 drug categories and 180 drug compounds.  Evidence of ETOH found on UDS. This is always concerning, especially when alcohol and opioid metabolites are both present at the same time. This can lead to respiratory depression and death. The issue will be discussed with the patient at length. Possible sources include: consumption of alcoholic beverages, or use of medications or products containing alcohol, such as NyQuil, or some cough medicines (antitussives). ______ 

## 2016-05-25 ENCOUNTER — Encounter: Payer: Self-pay | Admitting: Physician Assistant

## 2016-05-25 ENCOUNTER — Ambulatory Visit (INDEPENDENT_AMBULATORY_CARE_PROVIDER_SITE_OTHER): Payer: Medicare Other | Admitting: Physician Assistant

## 2016-05-25 VITALS — BP 160/82 | HR 77 | Temp 98.0°F | Resp 16 | Ht <= 58 in | Wt 121.4 lb

## 2016-05-25 DIAGNOSIS — E559 Vitamin D deficiency, unspecified: Secondary | ICD-10-CM

## 2016-05-25 DIAGNOSIS — Z0000 Encounter for general adult medical examination without abnormal findings: Secondary | ICD-10-CM | POA: Diagnosis not present

## 2016-05-25 DIAGNOSIS — Z1239 Encounter for other screening for malignant neoplasm of breast: Secondary | ICD-10-CM

## 2016-05-25 DIAGNOSIS — J449 Chronic obstructive pulmonary disease, unspecified: Secondary | ICD-10-CM

## 2016-05-25 DIAGNOSIS — E876 Hypokalemia: Secondary | ICD-10-CM

## 2016-05-25 DIAGNOSIS — E7800 Pure hypercholesterolemia, unspecified: Secondary | ICD-10-CM

## 2016-05-25 DIAGNOSIS — I10 Essential (primary) hypertension: Secondary | ICD-10-CM

## 2016-05-25 DIAGNOSIS — H04123 Dry eye syndrome of bilateral lacrimal glands: Secondary | ICD-10-CM

## 2016-05-25 MED ORDER — BRIMONIDINE TARTRATE 0.1 % OP SOLN: 1.0000 [drp] | Freq: Two times a day (BID) | OPHTHALMIC | 3 refills | Status: DC

## 2016-05-25 MED ORDER — SPIRONOLACTONE 25 MG PO TABS: 25.0000 mg | ORAL_TABLET | Freq: Every day | ORAL | 0 refills | Status: DC

## 2016-05-25 MED ORDER — RAMIPRIL 10 MG PO CAPS: ORAL_CAPSULE | ORAL | 3 refills | Status: DC

## 2016-05-25 MED ORDER — VITAMIN D 1000 UNITS PO TABS: 1000.0000 [IU] | ORAL_TABLET | Freq: Every day | ORAL | 3 refills | Status: DC

## 2016-05-25 MED ORDER — DILTIAZEM HCL 120 MG PO TABS: 120.0000 mg | ORAL_TABLET | Freq: Every day | ORAL | 3 refills | Status: DC

## 2016-05-25 MED ORDER — FLUTICASONE-SALMETEROL 250-50 MCG/DOSE IN AEPB: INHALATION_SPRAY | RESPIRATORY_TRACT | 3 refills | Status: DC

## 2016-05-25 MED ORDER — ATORVASTATIN CALCIUM 20 MG PO TABS: 20.0000 mg | ORAL_TABLET | Freq: Every day | ORAL | 3 refills | Status: DC

## 2016-05-25 MED ORDER — POTASSIUM CHLORIDE 20 MEQ/15ML (10%) PO SOLN: 20.0000 meq | Freq: Every day | ORAL | 6 refills | Status: DC

## 2016-05-25 NOTE — Progress Notes (Signed)
Patient: Suzanne Avila, Female    DOB: Feb 26, 1946, 70 y.o.   MRN: [id number] Visit Date: 05/26/2016  Today's Provider: Margaretann Loveless, PA-C   Chief Complaint  Patient presents with  . Medicare Wellness   Subjective:    Annual wellness visit Suzanne Avila is a 70 y.o. female. She feels well. She reports exercising none. She reports she is sleeping well.  Last PCP:12/30/2013 Mammogram:09/03/14 BI-RADS Category 1:Negative Zostavax: 05/23/2014 [facility]-document scanned in chart Prevnar 13: 01/09/14 [facility] Pneumococcal 23: 12/01/2009 [facility] BMD: 08/26/11-Osteopenia Colonoscopy:02/09/2011- 2 Polyps; repeat in 10 years -----------------------------------------------------------   Review of Systems  Constitutional: Positive for chills, diaphoresis and fatigue.  HENT: Negative.   Eyes: Positive for photophobia.  Respiratory: Positive for cough, shortness of breath and wheezing.   Cardiovascular: Positive for leg swelling.  Gastrointestinal: Negative.   Endocrine: Negative.   Genitourinary: Positive for enuresis.  Musculoskeletal: Positive for arthralgias, back pain and joint swelling.  Skin: Negative.   Allergic/Immunologic: Positive for food allergies.  Neurological: Negative.   Hematological: Bruises/bleeds easily.  Psychiatric/Behavioral: Positive for confusion.    Social History   Social History  . Marital status: Married    Spouse name: N/A  . Number of children: N/A  . Years of education: N/A   Occupational History  . Not on file.   Social History Main Topics  . Smoking status: Current Every Day Smoker    Packs/day: 1.00    Years: 50.00    Types: Cigarettes  . Smokeless tobacco: Never Used  . Alcohol use 0.0 oz/week  . Drug use: No  . Sexual activity: Not on file   Other Topics Concern  . Not on file   Social History Narrative  . No narrative on file    Past Medical History:  Diagnosis Date  . Glaucoma   . Hypertension       Patient Active Problem List   Diagnosis Date Noted  . Chronic lower extremity pain (Location of Primary Source of Pain) (Bilateral) (L>R) 04/25/2016  . Chronic upper extremity pain (Location of Tertiary source of pain) (Bilateral) (L>R) 04/25/2016  . Disturbance of skin sensation 04/20/2016  . Neurogenic pain 04/20/2016  . Chronic hip pain (Bilateral) (L>R) 11/11/2015  . Vitamin D deficiency 11/11/2015  . Hypokalemia 09/02/2015  . Edema 09/02/2015  . 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 Encounter for therapeutic drug level monitoring 08/14/2015  . Long term current use of opiate analgesic 08/14/2015  . Long term prescription opiate use 08/14/2015  . Uncomplicated opioid dependence (HCC) 08/14/2015  . Opiate use (30 MME/Day) 08/14/2015  . Chronic low back pain (Location of Secondary source of pain) (Bilateral) (L>R) 08/14/2015  . Chronic pain 08/14/2015  . Chronic pain syndrome 08/14/2015  . Alcoholic (HCC) 08/14/2015  . B12 deficiency 08/14/2015  . Narrowing of intervertebral disc space 08/14/2015  . Encounter for screening for diabetes mellitus 08/14/2015  . History of metabolic disorder 08/14/2015  . Fungal infection of toenail 08/14/2015  . Osteopenia 08/14/2015  . Compulsive tobacco user syndrome 08/14/2015  . Osteoarthritis of hip (Left) 08/14/2015  . Chronic hip pain (Left) 08/14/2015  . Chronic lumbar radicular pain (L5 dermatome) (Location of Primary Source of Pain) (Bilateral) (L>R) 08/14/2015  . Lumbar facet syndrome (Location of Primary Source of Pain) (Bilateral) (L>R) 08/14/2015  . Lumbar spondylosis 08/14/2015  . 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 Lumbar Postlaminectomy syndrome (L4-5) 08/14/2015  . Fibromyalgia 08/14/2015  .  Chronic airway obstruction (HCC) 04/21/2015  . Allergic rhinitis 02/10/2010  . Atrial paroxysmal tachycardia (HCC) 07/06/2009  . CAFL (chronic airflow limitation) (HCC) 07/06/2009  . Acid reflux  07/06/2009  . Glaucoma 07/06/2009  . Benign essential HTN 07/06/2009  . Hypercholesterolemia without hypertriglyceridemia 07/06/2009  . Absence of bladder continence 07/06/2009    Past Surgical History:  Procedure Laterality Date  . BACK SURGERY    . CARPAL TUNNEL RELEASE Left   . EYE SURGERY      Her family history includes Cancer in her father and mother.    Current Meds  Medication Sig  . albuterol (PROVENTIL HFA;VENTOLIN HFA) 108 (90 Base) MCG/ACT inhaler ALBUTEROL, 90MCG/ACT (Inhalation Aerosol Solution)  2 PUFFS Every Day, As Needed for 0 days  Quantity: 0.00;  Refills: 0   Ordered :09-Jan-2014  Denna Haggard, MA, Anastasiya ;  Started 10-Aug-2009 Active Comments: Medication taken as needed.   Marland Kitchen aspirin 325 MG tablet Take 325 mg by mouth daily.  Marland Kitchen atorvastatin (LIPITOR) 20 MG tablet Take 1 tablet (20 mg total) by mouth daily at 6 PM.  . brimonidine (ALPHAGAN P) 0.1 % SOLN Place 1 drop into both eyes 2 (two) times daily.  . cholecalciferol (VITAMIN D) 1000 units tablet Take 1 tablet (1,000 Units total) by mouth daily.  Marland Kitchen diltiazem (CARDIZEM) 120 MG tablet Take 1 tablet (120 mg total) by mouth daily.  . dorzolamide (TRUSOPT) 2 % ophthalmic solution   . Fluticasone-Salmeterol (ADVAIR DISKUS) 250-50 MCG/DOSE AEPB USE 1 INHALATION TWICE A DAY  . HYDROcodone-acetaminophen (NORCO) 7.5-325 MG tablet Take 1 tablet by mouth every 6 (six) hours as needed for severe pain.  Marland Kitchen latanoprost (XALATAN) 0.005 % ophthalmic solution 1 drop at bedtime.  . meloxicam (MOBIC) 15 MG tablet Take 1 tablet (15 mg total) by mouth daily.  . pregabalin (LYRICA) 150 MG capsule Take 1 capsule (150 mg total) by mouth every 8 (eight) hours.  . ramipril (ALTACE) 10 MG capsule TAKE 1 CAPSULE DAILY FOR BLOOD PRESSURE  . spironolactone (ALDACTONE) 25 MG tablet Take 1 tablet (25 mg total) by mouth daily.  . vitamin B-12 (CYANOCOBALAMIN) 500 MCG tablet Take 500 mcg by mouth daily.  . [DISCONTINUED] ADVAIR DISKUS  250-50 MCG/DOSE AEPB USE 1 INHALATION TWICE A DAY  . [DISCONTINUED] atorvastatin (LIPITOR) 20 MG tablet Take 20 mg by mouth daily at 6 PM.   . [DISCONTINUED] brimonidine (ALPHAGAN P) 0.1 % SOLN 1 drop 2 (two) times daily.  . [DISCONTINUED] cholecalciferol (VITAMIN D) 1000 UNITS tablet Take 1,000 Units by mouth daily.  . [DISCONTINUED] diltiazem (CARDIZEM) 120 MG tablet Take 120 mg by mouth 1 day or 1 dose.  . [DISCONTINUED] NEXIUM 40 MG capsule TAKE 1 CAPSULE DAILY FOR REFLUX  . [DISCONTINUED] potassium chloride SA (K-DUR,KLOR-CON) 20 MEQ tablet Take 1 tablet (20 mEq total) by mouth daily.  . [DISCONTINUED] ramipril (ALTACE) 10 MG capsule TAKE 1 CAPSULE DAILY FOR BLOOD PRESSURE  . [DISCONTINUED] spironolactone (ALDACTONE) 25 MG tablet Take 1 tablet (25 mg total) by mouth daily.    Patient Care Team: Margaretann Loveless, PA-C as PCP - General (Family Medicine)    Objective:   Vitals: BP (!) 160/82 (BP Location: Right Arm, Patient Position: Sitting, Cuff Size: Normal)   Pulse 77   Temp 98 F (36.7 C) (Oral)   Resp 16   Ht 4\' 10"  (1.473 m)   Wt 121 lb 6.4 oz (55.1 kg)   BMI 25.37 kg/m   Physical Exam  Constitutional: She is oriented to  person, place, and time. She appears well-developed and well-nourished. No distress.  HENT:  Head: Normocephalic and atraumatic.  Right Ear: External ear normal.  Left Ear: External ear normal.  Nose: Nose normal.  Mouth/Throat: Oropharynx is clear and moist. No oropharyngeal exudate.  Eyes: Conjunctivae and EOM are normal. Pupils are equal, round, and reactive to light. Right eye exhibits no discharge. Left eye exhibits no discharge. No scleral icterus.  Neck: Normal range of motion. Neck supple. No JVD present. No tracheal deviation present. No thyromegaly present.  Cardiovascular: Normal rate, regular rhythm, normal heart sounds and intact distal pulses.  Exam reveals no gallop and no friction rub.   No murmur heard. Pulmonary/Chest: Effort  normal. No respiratory distress. She has wheezes. She has no rales. She exhibits no tenderness.  Abdominal: Soft. Bowel sounds are normal. She exhibits no distension and no mass. There is no tenderness. There is no rebound and no guarding.  Musculoskeletal: Normal range of motion. She exhibits no edema or tenderness.  Lymphadenopathy:    She has no cervical adenopathy.  Neurological: She is alert and oriented to person, place, and time.  Skin: Skin is warm and dry. No rash noted. She is not diaphoretic.  Psychiatric: Her behavior is normal. Judgment and thought content normal.  Agitated  Vitals reviewed.   Activities of Daily Living No flowsheet data found.  Fall Risk Assessment Fall Risk  05/25/2016 04/20/2016 02/01/2016 11/11/2015 08/14/2015  Falls in the past year? No No No No No     Depression Screen PHQ 2/9 Scores 05/25/2016 04/20/2016 02/01/2016 11/11/2015  PHQ - 2 Score 2 0 0 0  PHQ- 9 Score 5 - - -    Cognitive Testing - 6-CIT  Correct? Score   What year is it? yes 0 0 or 4  What month is it? yes 0 0 or 3  Memorize:    Floyde Parkins,  41,  [address],  [place],      What time is it? (within 1 hour) yes 0 0 or 3  Count backwards from 20 yes 0 0, 2, or 4  Name the months of the year yes 0 0, 2, or 4  Repeat name & address above no 3 0, 2, 4, 6, 8, or 10       TOTAL SCORE  3/28   Interpretation:  Normal  Normal (0-7) Abnormal (8-28)   Audit-C Alcohol Use Screening  Question Answer Points  How often do you have alcoholic drink? 4 or more times/week times weekly 4  On days you do drink alcohol, how many drinks do you typically consume? 3-4 1  How oftey will you drink 6 or more in a total? weekly 3  Total Score:  8   A score of 3 or more in women, and 4 or more in men indicates increased risk for alcohol abuse, EXCEPT if all of the points are from question 1.  Visual Acuity Screening   Right eye Left eye Both eyes  Without correction: 20/50 20/200 20/50  With correction:  20/40 20/40 20/30   Comments: She is due for an eye exam in October. She reports she goes every 6 months.     Assessment & Plan:     Annual Wellness Visit  Reviewed patient's Family Medical History Reviewed and updated list of patient's medical providers Assessment of cognitive impairment was done Assessed patient's functional ability Established a written schedule for health screening services Health Risk Assessent Completed and Reviewed  Exercise Activities and  Dietary recommendations Goals    None       There is no immunization history on file for this patient.  Health Maintenance  Topic Date Due  . Hepatitis C Screening  05-07-1946  . TETANUS/TDAP  02/21/1964  . INFLUENZA VACCINE  05/24/2016  . MAMMOGRAM  09/03/2016  . COLONOSCOPY  01/29/2021  . DEXA SCAN  Completed  . ZOSTAVAX  Completed  . PNA vac Low Risk Adult  Completed      Discussed health benefits of physical activity, and encouraged her to engage in regular exercise appropriate for her age and condition.  1. Medicare annual wellness visit, subsequent Normal exam. Patient needs medication refills. Will reschedule a follow up in 3-4 weeks to discuss acute issues and possibly check labs.  2. Essential hypertension Stable. Diagnosis pulled for medication refill. Continue current medical treatment plan. - spironolactone (ALDACTONE) 25 MG tablet; Take 1 tablet (25 mg total) by mouth daily.  Dispense: 90 tablet; Refill: 0 - ramipril (ALTACE) 10 MG capsule; TAKE 1 CAPSULE DAILY FOR BLOOD PRESSURE  Dispense: 90 capsule; Refill: 3 - diltiazem (CARDIZEM) 120 MG tablet; Take 1 tablet (120 mg total) by mouth daily.  Dispense: 90 tablet; Refill: 3  3. Chronic obstructive pulmonary disease, unspecified COPD type (HCC) Stable. Diagnosis pulled for medication refill. Continue current medical treatment plan. - Fluticasone-Salmeterol (ADVAIR DISKUS) 250-50 MCG/DOSE AEPB; USE 1 INHALATION TWICE A DAY  Dispense: 180 each;  Refill: 3  4. Hypercholesterolemia Stable. Diagnosis pulled for medication refill. Continue current medical treatment plan. - atorvastatin (LIPITOR) 20 MG tablet; Take 1 tablet (20 mg total) by mouth daily at 6 PM.  Dispense: 90 tablet; Refill: 3  5. Dry eye, bilateral Stable. Diagnosis pulled for medication refill. Continue current medical treatment plan. - brimonidine (ALPHAGAN P) 0.1 % SOLN; Place 1 drop into both eyes 2 (two) times daily.  Dispense: 15 mL; Refill: 3  6. Vitamin D deficiency Stable. Diagnosis pulled for medication refill. Continue current medical treatment plan. - cholecalciferol (VITAMIN D) 1000 units tablet; Take 1 tablet (1,000 Units total) by mouth daily.  Dispense: 90 tablet; Refill: 3  7. Hypokalemia Stable. Diagnosis pulled for medication refill. Continue current medical treatment plan. - potassium chloride 20 MEQ/15ML (10%) SOLN; Take 15 mLs (20 mEq total) by mouth daily.  Dispense: 473 mL; Refill: 6  8. Breast cancer screening Mammogram due. No family history of breast cancer. Information for [facility] given to patient so she may call and schedule at her convenience. - MM Digital Screening; Future   ------------------------------------------------------------------------------------------------------------    Margaretann Loveless, PA-C  [facility]

## 2016-05-25 NOTE — Patient Instructions (Signed)

## 2016-06-13 ENCOUNTER — Ambulatory Visit (INDEPENDENT_AMBULATORY_CARE_PROVIDER_SITE_OTHER): Payer: Medicare Other | Admitting: Physician Assistant

## 2016-06-13 ENCOUNTER — Encounter: Payer: Self-pay | Admitting: Physician Assistant

## 2016-06-13 VITALS — BP 162/100 | HR 93 | Temp 98.1°F | Resp 15 | Wt 118.8 lb

## 2016-06-13 DIAGNOSIS — M2550 Pain in unspecified joint: Secondary | ICD-10-CM | POA: Diagnosis not present

## 2016-06-13 DIAGNOSIS — R252 Cramp and spasm: Secondary | ICD-10-CM | POA: Diagnosis not present

## 2016-06-13 DIAGNOSIS — R609 Edema, unspecified: Secondary | ICD-10-CM

## 2016-06-13 DIAGNOSIS — E7800 Pure hypercholesterolemia, unspecified: Secondary | ICD-10-CM | POA: Diagnosis not present

## 2016-06-13 DIAGNOSIS — M791 Myalgia, unspecified site: Secondary | ICD-10-CM

## 2016-06-13 DIAGNOSIS — R202 Paresthesia of skin: Secondary | ICD-10-CM | POA: Diagnosis not present

## 2016-06-13 NOTE — Patient Instructions (Signed)

## 2016-06-13 NOTE — Progress Notes (Signed)
Patient: Suzanne Avila Female    DOB: 1946-08-23   70 y.o.   MRN: [id number] Visit Date: 06/13/2016  Today's Provider: Margaretann LovelessJennifer M Avigayil Ton, PA-C   No chief complaint on file.  Subjective:    HPI Patient is here today to discuss acute issues. She reports that she gets cramps on her legs, arms, fingers. She also reports that her belly hurts when tying her shoes. She has also had ankle swelling for the last month. She is normally on spironalactone but has not had for last month due to being out.   She does have arthritis, hypertension, PAD,fibromyalgia, chronic back pain, chronic hip pain, vit d def (no supplement at this time) and B12 def (on supplement), h/o opiate abuse and alcohol abuse, and is a smoker. She is also followed by Dr. Shireen QuanNaviera at the pain clinic.      Allergies  Allergen Reactions  . Codeine Itching  . Cyclobenzaprine Itching  . Penicillins     itching   Current Meds  Medication Sig  . albuterol (PROVENTIL HFA;VENTOLIN HFA) 108 (90 Base) MCG/ACT inhaler ALBUTEROL, 90MCG/ACT (Inhalation Aerosol Solution)  2 PUFFS Every Day, As Needed for 0 days  Quantity: 0.00;  Refills: 0   Ordered :09-Jan-2014  Denna HaggardAleksandrova, MA, Anastasiya ;  Started 10-Aug-2009 Active Comments: Medication taken as needed.   Marland Kitchen. aspirin 325 MG tablet Take 325 mg by mouth daily.  Marland Kitchen. atorvastatin (LIPITOR) 20 MG tablet Take 1 tablet (20 mg total) by mouth daily at 6 PM.  . brimonidine (ALPHAGAN P) 0.1 % SOLN Place 1 drop into both eyes 2 (two) times daily.  . cholecalciferol (VITAMIN D) 1000 units tablet Take 1 tablet (1,000 Units total) by mouth daily.  Marland Kitchen. diltiazem (CARDIZEM) 120 MG tablet Take 1 tablet (120 mg total) by mouth daily.  . dorzolamide (TRUSOPT) 2 % ophthalmic solution   . Fluticasone-Salmeterol (ADVAIR DISKUS) 250-50 MCG/DOSE AEPB USE 1 INHALATION TWICE A DAY  . HYDROcodone-acetaminophen (NORCO) 7.5-325 MG tablet Take 1 tablet by mouth every 6 (six) hours as needed for severe  pain.  Marland Kitchen. latanoprost (XALATAN) 0.005 % ophthalmic solution 1 drop at bedtime.  . meloxicam (MOBIC) 15 MG tablet Take 1 tablet (15 mg total) by mouth daily.  . potassium chloride 20 MEQ/15ML (10%) SOLN Take 15 mLs (20 mEq total) by mouth daily.  . pregabalin (LYRICA) 150 MG capsule Take 1 capsule (150 mg total) by mouth every 8 (eight) hours.  . ramipril (ALTACE) 10 MG capsule TAKE 1 CAPSULE DAILY FOR BLOOD PRESSURE  . spironolactone (ALDACTONE) 25 MG tablet Take 1 tablet (25 mg total) by mouth daily.  . vitamin B-12 (CYANOCOBALAMIN) 500 MCG tablet Take 500 mcg by mouth daily.    Review of Systems  Constitutional: Positive for diaphoresis and fatigue.  HENT: Negative.   Eyes: Negative for visual disturbance.  Respiratory: Negative for cough, chest tightness and shortness of breath.   Cardiovascular: Positive for leg swelling. Negative for chest pain and palpitations.  Gastrointestinal: Positive for abdominal pain (only when bending over). Negative for blood in stool, constipation, diarrhea, nausea and vomiting.  Endocrine: Negative.   Musculoskeletal: Positive for arthralgias, back pain and joint swelling.  Neurological: Negative.   Psychiatric/Behavioral: Positive for confusion. Negative for dysphoric mood, self-injury, sleep disturbance and suicidal ideas. The patient is not nervous/anxious.     Social History  Substance Use Topics  . Smoking status: Current Every Day Smoker    Packs/day: 1.00    Years:  50.00    Types: Cigarettes  . Smokeless tobacco: Never Used  . Alcohol use 0.0 oz/week   Objective:   BP (!) 162/100 (BP Location: Right Arm, Patient Position: Sitting, Cuff Size: Normal) Comment: Patient states took one bp pill this morning.  Pulse 93   Temp 98.1 F (36.7 C) (Oral)   Resp 15   Wt 118 lb 12.8 oz (53.9 kg)   SpO2 96%   BMI 24.83 kg/m   Physical Exam  Constitutional: She appears well-developed and well-nourished. No distress.  Neck: Normal range of motion.  Neck supple. No tracheal deviation present. No thyromegaly present.  Cardiovascular: Normal rate, regular rhythm and normal heart sounds.  Exam reveals no gallop and no friction rub.   No murmur heard. Pulmonary/Chest: Effort normal and breath sounds normal. No respiratory distress. She has no wheezes. She has no rales.  Abdominal: Soft. Bowel sounds are normal. She exhibits no distension and no mass. There is no tenderness. There is no rebound and no guarding.  Musculoskeletal: She exhibits edema (1+ left, trace right).  Lymphadenopathy:    She has no cervical adenopathy.  Skin: She is not diaphoretic.  Vitals reviewed.     Assessment & Plan:     1. Myalgia Unknown source of cramping and tingling with muscle pains. COuld be progression of arthritis and fibromyalgia. Will check labs to R/O vit def, anemia, thyroid cause, being from the statin, or autoimmune source. I will follow up pending lab results. - CBC with Differential - Comprehensive Metabolic Panel (CMET) - TSH - Vitamin D (25 hydroxy) - B12 - CK (Creatine Kinase) - ANA w/Reflex if Positive - Rheumatoid Factor  2. Arthralgia See above medical treatment plan. - CBC with Differential - Comprehensive Metabolic Panel (CMET) - TSH - Vitamin D (25 hydroxy) - B12 - CK (Creatine Kinase) - ANA w/Reflex if Positive - Rheumatoid Factor  3. Cramps, extremity See above medical treatment plan. - CBC with Differential - Comprehensive Metabolic Panel (CMET) - TSH - Vitamin D (25 hydroxy) - B12 - ANA w/Reflex if Positive - Rheumatoid Factor  4. Tingling in extremities See above medical treatment plan. - CBC with Differential - Comprehensive Metabolic Panel (CMET) - TSH - Vitamin D (25 hydroxy) - B12 - ANA w/Reflex if Positive - Rheumatoid Factor  5. Edema, unspecified type Will make sure no early heart failure signs or autoimmune cause. Continue spironalactone for now. - B Nat Peptide - ANA w/Reflex if Positive -  Rheumatoid Factor  6. Hypercholesterolemia H/O this and currently on statin. Will check labs as below and f/u pending results. - Lipid Profile       Margaretann LovelessJennifer M Elhadj Girton, PA-C  [facility]

## 2016-06-14 DIAGNOSIS — E7800 Pure hypercholesterolemia, unspecified: Secondary | ICD-10-CM | POA: Diagnosis not present

## 2016-06-14 DIAGNOSIS — R609 Edema, unspecified: Secondary | ICD-10-CM | POA: Diagnosis not present

## 2016-06-14 DIAGNOSIS — M791 Myalgia: Secondary | ICD-10-CM | POA: Diagnosis not present

## 2016-06-14 DIAGNOSIS — M2550 Pain in unspecified joint: Secondary | ICD-10-CM | POA: Diagnosis not present

## 2016-06-14 DIAGNOSIS — R202 Paresthesia of skin: Secondary | ICD-10-CM | POA: Diagnosis not present

## 2016-06-14 DIAGNOSIS — R252 Cramp and spasm: Secondary | ICD-10-CM | POA: Diagnosis not present

## 2016-06-15 ENCOUNTER — Other Ambulatory Visit: Payer: Self-pay

## 2016-06-15 LAB — CBC WITH DIFFERENTIAL/PLATELET
Basophils Absolute: 0.1 10*3/uL (ref 0.0–0.2)
Basos: 1 %
EOS (ABSOLUTE): 0.3 10*3/uL (ref 0.0–0.4)
Eos: 4 %
Hematocrit: 43 % (ref 34.0–46.6)
Hemoglobin: 14.8 g/dL (ref 11.1–15.9)
Immature Grans (Abs): 0 10*3/uL (ref 0.0–0.1)
Immature Granulocytes: 0 %
Lymphocytes Absolute: 1 10*3/uL (ref 0.7–3.1)
Lymphs: 15 %
MCH: 31.6 pg (ref 26.6–33.0)
MCHC: 34.4 g/dL (ref 31.5–35.7)
MCV: 92 fL (ref 79–97)
Monocytes Absolute: 0.8 10*3/uL (ref 0.1–0.9)
Monocytes: 12 %
Neutrophils Absolute: 4.8 10*3/uL (ref 1.4–7.0)
Neutrophils: 68 %
Platelets: 267 10*3/uL (ref 150–379)
RBC: 4.68 x10E6/uL (ref 3.77–5.28)
RDW: 14.1 % (ref 12.3–15.4)
WBC: 7 10*3/uL (ref 3.4–10.8)

## 2016-06-15 LAB — COMPREHENSIVE METABOLIC PANEL
ALT: 15 IU/L (ref 0–32)
AST: 24 IU/L (ref 0–40)
Albumin/Globulin Ratio: 1.6 (ref 1.2–2.2)
Albumin: 4.4 g/dL (ref 3.5–4.8)
Alkaline Phosphatase: 138 IU/L — ABNORMAL HIGH (ref 39–117)
BUN/Creatinine Ratio: 13 (ref 12–28)
BUN: 9 mg/dL (ref 8–27)
Bilirubin Total: 0.5 mg/dL (ref 0.0–1.2)
CO2: 23 mmol/L (ref 18–29)
Calcium: 9.6 mg/dL (ref 8.7–10.3)
Chloride: 97 mmol/L (ref 96–106)
Creatinine, Ser: 0.71 mg/dL (ref 0.57–1.00)
GFR calc Af Amer: 99 mL/min/{1.73_m2} (ref >59)
GFR calc non Af Amer: 86 mL/min/{1.73_m2} (ref >59)
Globulin, Total: 2.8 g/dL (ref 1.5–4.5)
Glucose: 85 mg/dL (ref 65–99)
Potassium: 4.3 mmol/L (ref 3.5–5.2)
Sodium: 139 mmol/L (ref 134–144)
Total Protein: 7.2 g/dL (ref 6.0–8.5)

## 2016-06-15 LAB — LIPID PANEL
Chol/HDL Ratio: 1.9 ratio units (ref 0.0–4.4)
Cholesterol, Total: 178 mg/dL (ref 100–199)
HDL: 92 mg/dL (ref >39)
LDL Calculated: 62 mg/dL (ref 0–99)
Triglycerides: 118 mg/dL (ref 0–149)
VLDL Cholesterol Cal: 24 mg/dL (ref 5–40)

## 2016-06-15 LAB — VITAMIN B12: Vitamin B-12: 1220 pg/mL — ABNORMAL HIGH (ref 211–946)

## 2016-06-15 LAB — TSH: TSH: 1.11 u[IU]/mL (ref 0.450–4.500)

## 2016-06-15 LAB — CK: Total CK: 120 U/L (ref 24–173)

## 2016-06-15 LAB — RHEUMATOID FACTOR: Rhuematoid fact SerPl-aCnc: <10 IU/mL (ref 0.0–13.9)

## 2016-06-15 LAB — ANA W/REFLEX IF POSITIVE: Anti Nuclear Antibody(ANA): NEGATIVE

## 2016-06-15 LAB — BRAIN NATRIURETIC PEPTIDE: BNP: 78.2 pg/mL (ref 0.0–100.0)

## 2016-06-15 LAB — VITAMIN D 25 HYDROXY (VIT D DEFICIENCY, FRACTURES): Vit D, 25-Hydroxy: 32.7 ng/mL (ref 30.0–100.0)

## 2016-06-15 MED ORDER — MAGNESIUM 250 MG PO TABS: 1.0000 | ORAL_TABLET | Freq: Every day | ORAL | 0 refills | Status: DC

## 2016-07-12 DIAGNOSIS — H401131 Primary open-angle glaucoma, bilateral, mild stage: Secondary | ICD-10-CM | POA: Diagnosis not present

## 2016-07-25 ENCOUNTER — Encounter: Payer: Self-pay | Admitting: Pain Medicine

## 2016-07-25 ENCOUNTER — Ambulatory Visit: Payer: Medicare Other | Attending: Pain Medicine | Admitting: Pain Medicine

## 2016-07-25 VITALS — BP 175/65 | HR 80 | Temp 97.6°F | Resp 18 | Ht 59.0 in | Wt 118.0 lb

## 2016-07-25 DIAGNOSIS — J309 Allergic rhinitis, unspecified: Secondary | ICD-10-CM | POA: Insufficient documentation

## 2016-07-25 DIAGNOSIS — G8929 Other chronic pain: Secondary | ICD-10-CM

## 2016-07-25 DIAGNOSIS — M8580 Other specified disorders of bone density and structure, unspecified site: Secondary | ICD-10-CM | POA: Insufficient documentation

## 2016-07-25 DIAGNOSIS — E538 Deficiency of other specified B group vitamins: Secondary | ICD-10-CM | POA: Diagnosis not present

## 2016-07-25 DIAGNOSIS — F1190 Opioid use, unspecified, uncomplicated: Secondary | ICD-10-CM | POA: Diagnosis not present

## 2016-07-25 DIAGNOSIS — Z79891 Long term (current) use of opiate analgesic: Secondary | ICD-10-CM | POA: Diagnosis not present

## 2016-07-25 DIAGNOSIS — I479 Paroxysmal tachycardia, unspecified: Secondary | ICD-10-CM | POA: Insufficient documentation

## 2016-07-25 DIAGNOSIS — G894 Chronic pain syndrome: Secondary | ICD-10-CM | POA: Insufficient documentation

## 2016-07-25 DIAGNOSIS — M5440 Lumbago with sciatica, unspecified side: Secondary | ICD-10-CM | POA: Insufficient documentation

## 2016-07-25 DIAGNOSIS — Z79899 Other long term (current) drug therapy: Secondary | ICD-10-CM | POA: Insufficient documentation

## 2016-07-25 DIAGNOSIS — M12852 Other specific arthropathies, not elsewhere classified, left hip: Secondary | ICD-10-CM | POA: Diagnosis not present

## 2016-07-25 DIAGNOSIS — M1612 Unilateral primary osteoarthritis, left hip: Secondary | ICD-10-CM

## 2016-07-25 DIAGNOSIS — M797 Fibromyalgia: Secondary | ICD-10-CM

## 2016-07-25 DIAGNOSIS — K219 Gastro-esophageal reflux disease without esophagitis: Secondary | ICD-10-CM | POA: Insufficient documentation

## 2016-07-25 DIAGNOSIS — E7800 Pure hypercholesterolemia, unspecified: Secondary | ICD-10-CM | POA: Diagnosis not present

## 2016-07-25 DIAGNOSIS — M79604 Pain in right leg: Secondary | ICD-10-CM | POA: Diagnosis not present

## 2016-07-25 DIAGNOSIS — M79605 Pain in left leg: Secondary | ICD-10-CM

## 2016-07-25 DIAGNOSIS — Z7289 Other problems related to lifestyle: Secondary | ICD-10-CM | POA: Diagnosis not present

## 2016-07-25 DIAGNOSIS — M961 Postlaminectomy syndrome, not elsewhere classified: Secondary | ICD-10-CM | POA: Diagnosis not present

## 2016-07-25 DIAGNOSIS — M545 Low back pain: Secondary | ICD-10-CM

## 2016-07-25 DIAGNOSIS — I10 Essential (primary) hypertension: Secondary | ICD-10-CM | POA: Insufficient documentation

## 2016-07-25 MED ORDER — PREGABALIN 150 MG PO CAPS: 150.0000 mg | ORAL_CAPSULE | Freq: Three times a day (TID) | ORAL | 0 refills | Status: DC

## 2016-07-25 MED ORDER — HYDROCODONE-ACETAMINOPHEN 7.5-325 MG PO TABS: 1.0000 | ORAL_TABLET | Freq: Four times a day (QID) | ORAL | 0 refills | Status: DC | PRN

## 2016-07-25 MED ORDER — MELOXICAM 15 MG PO TABS: 15.0000 mg | ORAL_TABLET | Freq: Every day | ORAL | 0 refills | Status: DC

## 2016-07-25 NOTE — Patient Instructions (Addendum)
Smoking Cessation, Tips for Success If you are ready to quit smoking, congratulations! You have chosen to help yourself be healthier. Cigarettes bring nicotine, tar, carbon monoxide, and other irritants into your body. Your lungs, heart, and blood vessels will be able to work better without these poisons. There are many different ways to quit smoking. Nicotine gum, nicotine patches, a nicotine inhaler, or nicotine nasal spray can help with physical craving. Hypnosis, support groups, and medicines help break the habit of smoking. WHAT THINGS CAN I DO TO MAKE QUITTING EASIER?  Here are some tips to help you quit for good:  Pick a date when you will quit smoking completely. Tell all of your friends and family about your plan to quit on that date.  Do not try to slowly cut down on the number of cigarettes you are smoking. Pick a quit date and quit smoking completely starting on that day.  Throw away all cigarettes.   Clean and remove all ashtrays from your home, work, and car.  On a card, write down your reasons for quitting. Carry the card with you and read it when you get the urge to smoke.  Cleanse your body of nicotine. Drink enough water and fluids to keep your urine clear or pale yellow. Do this after quitting to flush the nicotine from your body.  Learn to predict your moods. Do not let a bad situation be your excuse to have a cigarette. Some situations in your life might tempt you into wanting a cigarette.  Never have "just one" cigarette. It leads to wanting another and another. Remind yourself of your decision to quit.  Change habits associated with smoking. If you smoked while driving or when feeling stressed, try other activities to replace smoking. Stand up when drinking your coffee. Brush your teeth after eating. Sit in a different chair when you read the paper. Avoid alcohol while trying to quit, and try to drink fewer caffeinated beverages. Alcohol and caffeine may urge you to  smoke.  Avoid foods and drinks that can trigger a desire to smoke, such as sugary or spicy foods and alcohol.  Ask people who smoke not to smoke around you.  Have something planned to do right after eating or having a cup of coffee. For example, plan to take a walk or exercise.  Try a relaxation exercise to calm you down and decrease your stress. Remember, you may be tense and nervous for the first 2 weeks after you quit, but this will pass.  Find new activities to keep your hands busy. Play with a pen, coin, or rubber band. Doodle or draw things on paper.  Brush your teeth right after eating. This will help cut down on the craving for the taste of tobacco after meals. You can also try mouthwash.   Use oral substitutes in place of cigarettes. Try using lemon drops, carrots, cinnamon sticks, or chewing gum. Keep them handy so they are available when you have the urge to smoke.  When you have the urge to smoke, try deep breathing.  Designate your home as a nonsmoking area.  If you are a heavy smoker, ask your health care provider about a prescription for nicotine chewing gum. It can ease your withdrawal from nicotine.  Reward yourself. Set aside the cigarette money you save and buy yourself something nice.  Look for support from others. Join a support group or smoking cessation program. Ask someone at home or at work to help you with your plan   to quit smoking.  Always ask yourself, "Do I need this cigarette or is this just a reflex?" Tell yourself, "Today, I choose not to smoke," or "I do not want to smoke." You are reminding yourself of your decision to quit.  Do not replace cigarette smoking with electronic cigarettes (commonly called e-cigarettes). The safety of e-cigarettes is unknown, and some may contain harmful chemicals.  If you relapse, do not give up! Plan ahead and think about what you will do the next time you get the urge to smoke. HOW WILL I FEEL WHEN I QUIT SMOKING? You  may have symptoms of withdrawal because your body is used to nicotine (the addictive substance in cigarettes). You may crave cigarettes, be irritable, feel very hungry, cough often, get headaches, or have difficulty concentrating. The withdrawal symptoms are only temporary. They are strongest when you first quit but will go away within 10-14 days. When withdrawal symptoms occur, stay in control. Think about your reasons for quitting. Remind yourself that these are signs that your body is healing and getting used to being without cigarettes. Remember that withdrawal symptoms are easier to treat than the major diseases that smoking can cause.  Even after the withdrawal is over, expect periodic urges to smoke. However, these cravings are generally short lived and will go away whether you smoke or not. Do not smoke! WHAT RESOURCES ARE AVAILABLE TO HELP ME QUIT SMOKING? Your health care provider can direct you to community resources or hospitals for support, which may include:  Group support.  Education.  Hypnosis.  Therapy.   This information is not intended to replace advice given to you by your health care provider. Make sure you discuss any questions you have with your health care provider.   Document Released: 07/08/2004 Document Revised: 10/31/2014 Document Reviewed: 03/28/2013 Elsevier Interactive Patient Education 2016 [place]  What are the risk, side effects and possible complications? Generally speaking, most procedures are safe.  However, with any procedure there are risks, side effects, and the possibility of complications.  The risks and complications are dependent upon the sites that are lesioned, or the type of nerve block to be performed.  The closer the procedure is to the spine, the more serious the risks are.  Great care is taken when placing the radio frequency needles, block needles or lesioning probes, but sometimes complications can  occur. 1. Infection: Any time there is an injection through the skin, there is a risk of infection.  This is why sterile conditions are used for these blocks.  There are four possible types of infection. 1. Localized skin infection. 2. Central Nervous System Infection-This can be in the form of Meningitis, which can be deadly. 3. Epidural Infections-This can be in the form of an epidural abscess, which can cause pressure inside of the spine, causing compression of the spinal cord with subsequent paralysis. This would require an emergency surgery to decompress, and there are no guarantees that the patient would recover from the paralysis. 4. Discitis-This is an infection of the intervertebral discs.  It occurs in about 1% of discography procedures.  It is difficult to treat and it may lead to surgery.        2. Pain: the needles have to go through skin and soft tissues, will cause soreness.       3. Damage to internal structures:  The nerves to be lesioned may be near blood vessels or    other nerves which can  be potentially damaged.       4. Bleeding: Bleeding is more common if the patient is taking blood thinners such as  aspirin, Coumadin, Ticiid, Plavix, etc., or if he/she have some genetic predisposition  such as hemophilia. Bleeding into the spinal canal can cause compression of the spinal  cord with subsequent paralysis.  This would require an emergency surgery to  decompress and there are no guarantees that the patient would recover from the  paralysis.       5. Pneumothorax:  Puncturing of a lung is a possibility, every time a needle is introduced in  the area of the chest or upper back.  Pneumothorax refers to free air around the  collapsed lung(s), inside of the thoracic cavity (chest cavity).  Another two possible  complications related to a similar event would include: Hemothorax and Chylothorax.   These are variations of the Pneumothorax, where instead of air around the collapsed  lung(s),  you may have blood or chyle, respectively.       6. Spinal headaches: They may occur with any procedures in the area of the spine.       7. Persistent CSF (Cerebro-Spinal Fluid) leakage: This is a rare problem, but may occur  with prolonged intrathecal or epidural catheters either due to the formation of a fistulous  track or a dural tear.       8. Nerve damage: By working so close to the spinal cord, there is always a possibility of  nerve damage, which could be as serious as a permanent spinal cord injury with  paralysis.       9. Death:  Although rare, severe deadly allergic reactions known as "Anaphylactic  reaction" can occur to any of the medications used.      10. Worsening of the symptoms:  We can always make thing worse.  What are the chances of something like this happening? Chances of any of this occuring are extremely low.  By statistics, you have more of a chance of getting killed in a motor vehicle accident: while driving to the hospital than any of the above occurring .  Nevertheless, you should be aware that they are possibilities.  In general, it is similar to taking a shower.  Everybody knows that you can slip, hit your head and get killed.  Does that mean that you should not shower again?  Nevertheless always keep in mind that statistics do not mean anything if you happen to be on the wrong side of them.  Even if a procedure has a 1 (one) in a 1,000,000 (million) chance of going wrong, it you happen to be that one..Also, keep in mind that by statistics, you have more of a chance of having something go wrong when taking medications.  Who should not have this procedure? If you are on a blood thinning medication (e.g. Coumadin, Plavix, see list of "Blood Thinners"), or if you have an active infection going on, you should not have the procedure.  If you are taking any blood thinners, please inform your physician.  How should I prepare for this procedure?  Do not eat or drink anything at  least six hours prior to the procedure.  Bring a driver with you .  It cannot be a taxi.  Come accompanied by an adult that can drive you back, and that is strong enough to help you if your legs get weak or numb from the local anesthetic.  Take all of your medicines  the morning of the procedure with just enough water to swallow them.  If you have diabetes, make sure that you are scheduled to have your procedure done first thing in the morning, whenever possible.  If you have diabetes, take only half of your insulin dose and notify our nurse that you have done so as soon as you arrive at the clinic.  If you are diabetic, but only take blood sugar pills (oral hypoglycemic), then do not take them on the morning of your procedure.  You may take them after you have had the procedure.  Do not take aspirin or any aspirin-containing medications, at least eleven (11) days prior to the procedure.  They may prolong bleeding.  Wear loose fitting clothing that may be easy to take off and that you would not mind if it got stained with Betadine or blood.  Do not wear any jewelry or perfume  Remove any nail coloring.  It will interfere with some of our monitoring equipment.  NOTE: Remember that this is not meant to be interpreted as a complete list of all possible complications.  Unforeseen problems may occur.  BLOOD THINNERS The following drugs contain aspirin or other products, which can cause increased bleeding during surgery and should not be taken for 2 weeks prior to and 1 week after surgery.  If you should need take something for relief of minor pain, you may take acetaminophen which is found in Tylenol,m Datril, Anacin-3 and Panadol. It is not blood thinner. The products listed below are.  Do not take any of the products listed below in addition to any listed on your instruction sheet.  A.P.C or A.P.C with Codeine Codeine Phosphate Capsules #3 Ibuprofen Ridaura  ABC compound Congesprin Imuran  rimadil  Advil Cope Indocin Robaxisal  Alka-Seltzer Effervescent Pain Reliever and Antacid Coricidin or Coricidin-D  Indomethacin Rufen  Alka-Seltzer plus Cold Medicine Cosprin Ketoprofen S-A-C Tablets  Anacin Analgesic Tablets or Capsules Coumadin Korlgesic Salflex  Anacin Extra Strength Analgesic tablets or capsules CP-2 Tablets Lanoril Salicylate  Anaprox Cuprimine Capsules Levenox Salocol  Anexsia-D Dalteparin Magan Salsalate  Anodynos Darvon compound Magnesium Salicylate Sine-off  Ansaid Dasin Capsules Magsal Sodium Salicylate  Anturane Depen Capsules Marnal Soma  APF Arthritis pain formula Dewitt's Pills Measurin Stanback  Argesic Dia-Gesic Meclofenamic Sulfinpyrazone  Arthritis Bayer Timed Release Aspirin Diclofenac Meclomen Sulindac  Arthritis pain formula Anacin Dicumarol Medipren Supac  Analgesic (Safety coated) Arthralgen Diffunasal Mefanamic Suprofen  Arthritis Strength Bufferin Dihydrocodeine Mepro Compound Suprol  Arthropan liquid Dopirydamole Methcarbomol with Aspirin Synalgos  ASA tablets/Enseals Disalcid Micrainin Tagament  Ascriptin Doan's Midol Talwin  Ascriptin A/D Dolene Mobidin Tanderil  Ascriptin Extra Strength Dolobid Moblgesic Ticlid  Ascriptin with Codeine Doloprin or Doloprin with Codeine Momentum Tolectin  Asperbuf Duoprin Mono-gesic Trendar  Aspergum Duradyne Motrin or Motrin IB Triminicin  Aspirin plain, buffered or enteric coated Durasal Myochrisine Trigesic  Aspirin Suppositories Easprin Nalfon Trillsate  Aspirin with Codeine Ecotrin Regular or Extra Strength Naprosyn Uracel  Atromid-S Efficin Naproxen Ursinus  Auranofin Capsules Elmiron Neocylate Vanquish  Axotal Emagrin Norgesic Verin  Azathioprine Empirin or Empirin with Codeine Normiflo Vitamin E  Azolid Emprazil Nuprin Voltaren  Bayer Aspirin plain, buffered or children's or timed BC Tablets or powders Encaprin Orgaran Warfarin Sodium  Buff-a-Comp Enoxaparin Orudis Zorpin  Buff-a-Comp with  Codeine Equegesic Os-Cal-Gesic   Buffaprin Excedrin plain, buffered or Extra Strength Oxalid   Bufferin Arthritis Strength Feldene Oxphenbutazone   Bufferin plain or Extra Strength Feldene Capsules Oxycodone with Aspirin  Bufferin with Codeine Fenoprofen Fenoprofen Pabalate or Pabalate-SF   Buffets II Flogesic Panagesic   Buffinol plain or Extra Strength Florinal or Florinal with Codeine Panwarfarin   Buf-Tabs Flurbiprofen Penicillamine   Butalbital Compound Four-way cold tablets Penicillin   Butazolidin Fragmin Pepto-Bismol   Carbenicillin Geminisyn Percodan   Carna Arthritis Reliever Geopen Persantine   Carprofen Gold's salt Persistin   Chloramphenicol Goody's Phenylbutazone   Chloromycetin Haltrain Piroxlcam   Clmetidine heparin Plaquenil   Cllnoril Hyco-pap Ponstel   Clofibrate Hydroxy chloroquine Propoxyphen         Before stopping any of these medications, be sure to consult the physician who ordered them.  Some, such as Coumadin (Warfarin) are ordered to prevent or treat serious conditions such as "deep thrombosis", "pumonary embolisms", and other heart problems.  The amount of time that you may need off of the medication may also vary with the medication and the reason for which you were taking it.  If you are taking any of these medications, please make sure you notify your pain physician before you undergo any procedures.

## 2016-07-25 NOTE — Progress Notes (Signed)
Safety precautions to be maintained throughout the outpatient stay will include: orient to surroundings, keep bed in low position, maintain call bell within reach at all times, provide assistance with transfer out of bed and ambulation.  Pill box brought in for pill count.  Patient states there are #14 hydrocodone in pill box.  Reminded patient to bring in pill bottle.

## 2016-07-25 NOTE — Progress Notes (Signed)
Patient's Name: Suzanne Avila  MRN: [id number]  Referring Provider: Lorie Phenix, MD  DOB: [birth year]  PCP: Margaretann Loveless, PA-C  DOS: 07/25/2016  Note by: Sydnee Levans. Laban Emperor, MD  Service setting: Ambulatory outpatient  Specialty: Interventional Pain Management  Location: [facility] (AMB) Pain Management Facility    Patient type: Established   Primary Reason(s) for Visit: Encounter for prescription drug management (Level of risk: moderate) CC: Back Pain (left lower)  HPI  Ms. Contino is a 70 y.o. year old, female patient, who comes today for an initial evaluation. 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); Encounter for therapeutic drug level monitoring; Long term current use of opiate analgesic; Long term prescription opiate use; Uncomplicated opioid dependence (HCC); Opiate use (30 MME/Day); Chronic low back pain (Location of Secondary source of pain) (Bilateral) (L>R); Chronic pain; Chronic pain syndrome; Alcoholic (HCC); Allergic rhinitis; Atrial paroxysmal tachycardia (HCC); B12 deficiency; CAFL (chronic airflow limitation) (HCC); Narrowing of intervertebral disc space; Encounter for screening for diabetes mellitus; Acid reflux; Glaucoma; History of metabolic disorder; Benign essential HTN; Fungal infection of toenail; Osteopenia; Hypercholesterolemia without hypertriglyceridemia; Compulsive tobacco user syndrome; Absence of bladder continence; Osteoarthritis of hip (Left); Chronic hip pain (Left); Chronic lumbar radicular pain (L5 dermatome) (Location of Primary Source of Pain) (Bilateral) (L>R); Lumbar facet syndrome (Location of Primary Source of Pain) (Bilateral) (L>R); Lumbar spondylosis; 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 Her primarily concern today is the Back Pain (left lower)  Pain Assessment: Self-Reported Pain Score: 5 /10 Clinically the patient looks like a 2/10 Reported level is inconsistent with clinical observations. Information on the proper use of the pain score provided to the patient today. Pain Type: Chronic pain Pain Location: Back Pain Orientation: Lower Pain Descriptors / Indicators: Aching, Sharp Pain Frequency: Constant  The patient comes into the clinics today for pharmacological management of her chronic pain. I last saw this patient on 04/20/2016. The patient  reports that she does not use drugs. Her body mass index is 23.83 kg/m. The patient was informed of the changes currently occurring in society regarding the pain medication and how it may come to a point where we may need to taper down and discontinue her pain medicines.  Date of Last Visit: 04/20/16 Service Provided on Last Visit: Med Refill  Controlled Substance Pharmacotherapy Assessment & REMS (Risk Evaluation and Mitigation Strategy)  Analgesic: Hydrocodone/APAP 7.5/325 one every 6 hours (30 mg per day) MME/day: 30 mg/day.  Pill Count: Pill box brought in for pill count.  Patient states there are #14 hydrocodone in pill box.  Reminded patient to bring in pill bottle. Pharmacokinetics: Onset of action (Liberation/Absorption): Within expected pharmacological parameters Time to Peak effect (Distribution): Timing and results are as within normal expected parameters Duration of action (Metabolism/Excretion): Within normal limits for medication Pharmacodynamics: Analgesic Effect: More than 50% Activity Facilitation: Medication(s) allow patient to sit, stand, walk, and do the basic ADLs Perceived Effectiveness: Described as relatively effective, allowing for increase in activities  of daily living (ADL) Side-effects or Adverse reactions: None reported Monitoring: [place] PMP: Online review of the past 52-month period conducted. Compliant with practice rules and regulations List of all UDS test(s) done:  Lab Results  Component Value Date   TOXASSSELUR FINAL 04/20/2016   TOXASSSELUR FINAL 11/11/2015   Last UDS on record: ToxAssure Select 13  Date Value Ref Range Status  04/20/2016 FINAL  Final    Comment:    ==================================================================== TOXASSURE SELECT 13 (MW) ==================================================================== Test                             Result       Flag       Units Drug Present and Declared for Prescription Verification   Hydrocodone                    6138         EXPECTED   ng/mg creat   Hydromorphone                  663          EXPECTED   ng/mg creat   Dihydrocodeine                 345          EXPECTED   ng/mg creat   Norhydrocodone                 1788         EXPECTED   ng/mg creat    Sources of hydrocodone include scheduled prescription    medications. Hydromorphone, dihydrocodeine and norhydrocodone are    expected metabolites of hydrocodone. Hydromorphone and    dihydrocodeine are also available as scheduled prescription    medications. Drug Present not Declared for Prescription Verification   Alcohol, Ethyl                 0.061        UNEXPECTED g/dL    Sources of ethyl alcohol include alcoholic beverages or as a    fermentation product of glucose; glucose was not detected in this    specimen. Ethyl alcohol result should be interpreted in the    context of all available clinical and behavioral information. ==================================================================== Test                      Result    Flag   Units      Ref Range   Creatinine              56               mg/dL      >=09 ==================================================================== Declared  Medications:  The flagging and interpretation on this report are based on the  following declared medications.  Unexpected results may arise from  inaccuracies in the declared medications.  **Note: The testing scope of this panel includes these medications:  Hydrocodone (Norco)  **Note: The testing scope of this panel does not include following  reported medications:  Acetaminophen (Norco)  Albuterol  Aspirin  Atorvastatin (Lipitor)  Cyanocobalamin  Diltiazem (Cardizem)  Dorzolamide  Fluticasone (Advair)  Latanoprost (Xalatan)  Meloxicam (Mobic)  Omeprazole (Nexium)  Potassium (K-Dur)  Potassium (Klor-Con)  Pregabalin (Lyrica)  Ramipril (Altace)  Salmeterol (Advair)  Spironolactone (Aldactone)  Topical  Vitamin D ==================================================================== For clinical consultation, please call [phone]. ====================================================================    UDS interpretation: Compliant          Medication Assessment Form: Reviewed. Patient indicates being compliant with therapy Treatment compliance: Compliant Risk Assessment: Aberrant Behavior: None observed today Substance Use  Disorder (SUD) Risk Level: Low-to-moderate Risk of opioid abuse or dependence: 0.7-3.0% with doses ? 36 MME/day and 6.1-26% with doses ? 120 MME/day. Opioid Risk Tool (ORT) Score: 0   Low Risk for SUD (Score <3) Depression Scale Score: PHQ-2: 0   No depression (0) PHQ-9: 0   No depression (0-4)  Pharmacologic Plan: No change in therapy, at this time  Laboratory Chemistry  Inflammation Markers Lab Results  Component Value Date   ESRSEDRATE 23 11/11/2015   CRP <0.5 11/11/2015   Renal Function Lab Results  Component Value Date   BUN 9 06/14/2016   CREATININE 0.71 06/14/2016   GFRAA 99 06/14/2016   GFRNONAA 86 06/14/2016   Hepatic Function Lab Results  Component Value Date   AST 24 06/14/2016   ALT 15 06/14/2016   ALBUMIN 4.4  06/14/2016   Electrolytes Lab Results  Component Value Date   NA 139 06/14/2016   K 4.3 06/14/2016   CL 97 06/14/2016   CALCIUM 9.6 06/14/2016   MG 1.8 11/11/2015   Pain Modulating Vitamins Lab Results  Component Value Date   VD25OH 32.7 06/14/2016   VITAMINB12 1,220 (H) 06/14/2016   Coagulation Parameters Lab Results  Component Value Date   PLT 267 06/14/2016   Cardiovascular Lab Results  Component Value Date   BNP 78.2 06/14/2016   HGB 16.0 03/22/2014   HCT 43.0 06/14/2016    Note: Lab results reviewed.  Recent Diagnostic Imaging  [place] Abdomen Limited  Result Date: 09/22/2014 * PRIOR REPORT IMPORTED FROM AN EXTERNAL SYSTEM * CLINICAL DATA:  Elevated liver enzymes EXAM: US ABDOMEN LIMITED - RIGHT UPPER QUADRANT COMPARISON:  None. FINDINGS: Gallbladder: No gallstones or wall thickening visualized. There is no pericholecystic fluid. No sonographic Murphy sign noted. Common bile duct: Diameter: 6 mm. There is no intrahepatic or extrahepatic biliary duct dilatation. Liver: No focal lesion identified. Liver echogenicity is mildly increased and inhomogeneous. IMPRESSION: Liver echogenicity is mildly increased and inhomogeneous. This appearance is most likely due to hepatic steatosis. While no focal liver lesions are identified, it must be cautioned that the sensitivity of ultrasound for focal liver lesions is diminished in this circumstance. Study otherwise unremarkable. Electronically Signed   By: Bretta BangWilliam  Woodruff M.D.   On: 09/22/2014 09:19    Meds  The patient has a current medication list which includes the following prescription(s): albuterol, aspirin, atorvastatin, cholecalciferol, diltiazem, dorzolamide, fluticasone-salmeterol, hydrocodone-acetaminophen, hydrocodone-acetaminophen, hydrocodone-acetaminophen, klor-con m20, latanoprost, meloxicam, pregabalin, ramipril, spironolactone, and vitamin b-12.  Current Outpatient Prescriptions on File Prior to Visit  Medication Sig   . albuterol (PROVENTIL HFA;VENTOLIN HFA) 108 (90 Base) MCG/ACT inhaler ALBUTEROL, 90MCG/ACT (Inhalation Aerosol Solution)  2 PUFFS Every Day, As Needed for 0 days  Quantity: 0.00;  Refills: 0   Ordered :09-Jan-2014  Denna HaggardAleksandrova, MA, Anastasiya ;  Started 10-Aug-2009 Active Comments: Medication taken as needed.   Marland Kitchen. aspirin 325 MG tablet Take 325 mg by mouth daily.  . cholecalciferol (VITAMIN D) 1000 units tablet Take 1 tablet (1,000 Units total) by mouth daily.  Marland Kitchen. diltiazem (CARDIZEM) 120 MG tablet Take 1 tablet (120 mg total) by mouth daily.  . dorzolamide (TRUSOPT) 2 % ophthalmic solution   . Fluticasone-Salmeterol (ADVAIR DISKUS) 250-50 MCG/DOSE AEPB USE 1 INHALATION TWICE A DAY  . latanoprost (XALATAN) 0.005 % ophthalmic solution 1 drop at bedtime.  . ramipril (ALTACE) 10 MG capsule TAKE 1 CAPSULE DAILY FOR BLOOD PRESSURE  . spironolactone (ALDACTONE) 25 MG tablet Take 1 tablet (25 mg total) by mouth daily.  .Marland Kitchen  vitamin B-12 (CYANOCOBALAMIN) 500 MCG tablet Take 500 mcg by mouth daily.   No current facility-administered medications on file prior to visit.    ROS  Constitutional: Denies any fever or chills Gastrointestinal: No reported hemesis, hematochezia, vomiting, or acute GI distress Musculoskeletal: Denies any acute onset joint swelling, redness, loss of ROM, or weakness Neurological: No reported episodes of acute onset apraxia, aphasia, dysarthria, agnosia, amnesia, paralysis, loss of coordination, or loss of consciousness  Allergies  Ms. Sianez is allergic to codeine; cyclobenzaprine; and penicillins.  PFSH  Medical:  Ms. Cryderman  has a past medical history of Glaucoma and Hypertension. Family: family history includes Cancer in her father and mother. Surgical:  has a past surgical history that includes Back surgery; Eye surgery; and Carpal tunnel release (Left). Tobacco:  reports that she has been smoking Cigarettes.  She has a 50.00 pack-year smoking history. She has  never used smokeless tobacco. Alcohol:  reports that she drinks alcohol. Drug:  reports that she does not use drugs.  Constitutional Exam  General appearance: Well nourished, well developed, and well hydrated. In no acute distress Vitals:   07/25/16 1016  BP: (!) 175/65  Pulse: 80  Resp: 18  Temp: 97.6 F (36.4 C)  SpO2: 100%  Weight: 118 lb (53.5 kg)  Height: 4\' 11"  (1.499 m)  BMI Assessment: Estimated body mass index is 23.83 kg/m as calculated from the following:   Height as of this encounter: 4\' 11"  (1.499 m).   Weight as of this encounter: 118 lb (53.5 kg).   BMI interpretation: (18.5-24.9 kg/m2) = Ideal body weight BMI Readings from Last 4 Encounters:  07/25/16 23.83 kg/m  06/13/16 24.83 kg/m  05/25/16 25.37 kg/m  04/20/16 23.23 kg/m   Wt Readings from Last 4 Encounters:  07/25/16 118 lb (53.5 kg)  06/13/16 118 lb 12.8 oz (53.9 kg)  05/25/16 121 lb 6.4 oz (55.1 kg)  04/20/16 115 lb (52.2 kg)  Psych/Mental status: Alert and oriented x 3 (person, place, & time) Eyes: PERLA Respiratory: No evidence of acute respiratory distress  Cervical Spine Exam  Inspection: No masses, redness, or swelling Alignment: Symmetrical Functional ROM: Unrestricted ROM Stability: No instability detected Muscle strength & Tone: Functionally intact Sensory: Unimpaired Palpation: Non-contributory  Upper Extremity (UE) Exam    Side: Right upper extremity  Side: Left upper extremity  Inspection: No masses, redness, swelling, or asymmetry  Inspection: No masses, redness, swelling, or asymmetry  Functional ROM: Unrestricted ROM         Functional ROM: Unrestricted ROM          Muscle strength & Tone: Functionally intact  Muscle strength & Tone: Functionally intact  Sensory: Unimpaired  Sensory: Unimpaired  Palpation: Non-contributory  Palpation: Non-contributory   Thoracic Spine Exam  Inspection: No masses, redness, or swelling Alignment: Symmetrical Functional ROM: Unrestricted  ROM Stability: No instability detected Sensory: Unimpaired Muscle strength & Tone: Functionally intact Palpation: Non-contributory  Lumbar Spine Exam  Inspection: No masses, redness, or swelling Alignment: Symmetrical Functional ROM: Unrestricted ROM Stability: No instability detected Muscle strength & Tone: Functionally intact Sensory: Unimpaired Palpation: Non-contributory Provocative Tests: Lumbar Hyperextension and rotation test: evaluation deferred today       Patrick's Maneuver: evaluation deferred today              Gait & Posture Assessment  Ambulation: Unassisted Gait: Relatively normal for age and body habitus Posture: WNL   Lower Extremity Exam    Side: Right lower extremity  Side: Left lower extremity  Inspection: No masses, redness, swelling, or asymmetry  Inspection: No masses, redness, swelling, or asymmetry  Functional ROM: Unrestricted ROM          Functional ROM: Unrestricted ROM          Muscle strength & Tone: Functionally intact  Muscle strength & Tone: Functionally intact  Sensory: Unimpaired  Sensory: Unimpaired  Palpation: Non-contributory  Palpation: Non-contributory   Assessment  Primary Diagnosis & Pertinent Problem List: The primary encounter diagnosis was Other chronic pain. 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  Visit Diagnosis: 1. Other chronic pain   2. Long term current use of opiate analgesic   3. Opiate use (30 MME/Day)   4. Chronic pain of both lower extremities   5. 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 Arthropathy of left hip   7. Fibromyalgia   8. Chronic midline low back pain, with sciatica presence unspecified    Plan of Care  Pharmacotherapy (Medications  Ordered): Meds ordered this encounter  Medications  . meloxicam (MOBIC) 15 MG tablet    Sig: Take 1 tablet (15 mg total) by mouth daily.    Dispense:  90 tablet    Refill:  0    Patient may have prescription filled one day early if pharmacy is closed on scheduled refill date. 3 month refill.  . pregabalin (LYRICA) 150 MG capsule    Sig: Take 1 capsule (150 mg total) by mouth every 8 (eight) hours.    Dispense:  270 capsule    Refill:  0    Do not place this medication, or any other prescription from our practice, on "Automatic Refill". Patient may have prescription filled one day early if pharmacy is closed on scheduled refill date. 3 month supply.  Marland Kitchen HYDROcodone-acetaminophen (NORCO) 7.5-325 MG tablet    Sig: Take 1 tablet by mouth every 6 (six) hours as needed for severe pain.    Dispense:  120 tablet    Refill:  0    Do not place this medication, or any other prescription from our practice, on "Automatic Refill". Patient may have prescription filled one day early if pharmacy is closed on scheduled refill date. Do not fill until: 08/05/16 To last until: 09/04/16  . HYDROcodone-acetaminophen (NORCO) 7.5-325 MG tablet    Sig: Take 1 tablet by mouth every 6 (six) hours as needed for severe pain.    Dispense:  120 tablet    Refill:  0    Do not place this medication, or any other prescription from our practice, on "Automatic Refill". Patient may have prescription filled one day early if pharmacy is closed on scheduled refill date. Do not fill until: 09/04/16 To last until: 10/04/16  . HYDROcodone-acetaminophen (NORCO) 7.5-325 MG tablet    Sig: Take 1 tablet by mouth every 6 (six) hours as needed for severe pain.    Dispense:  120 tablet    Refill:  0    Do not place this medication, or any other prescription from our practice, on "Automatic Refill". Patient may have prescription filled one day early if pharmacy is closed on scheduled refill date. Do not fill until: 10/04/16 To last  until: 11/03/16   New Prescriptions   No medications on file   Medications administered during this visit:  Ms. Eddleman had no medications administered during this visit. Lab-work, Procedure(s), & Referral(s) Ordered: Orders Placed This Encounter  Procedures  . DG Lumbar Spine Complete W/Bend  . ToxASSURE Select 13 (MW), Urine   Imaging & Referral(s) Ordered: DG LUMBAR SPINE COMPLETE W/BEND 6+V  Interventional Therapies: Scheduled:  None at this time.    Considering:  Diagnostic bilateral intra-articular hip joint injection under fluoroscopic guidance, without without sedation. Possible bilateral hip joint radiofrequency ablation. Diagnostic bilateral lumbar facet block under fluoroscopic guidance and IV sedation. Possible bilateral lumbar facet radiofrequency ablation under fluoroscopic guidance and IV sedation.  Diagnostic bilateral L4-L5 transforaminal epidural steroid injection under fluoroscopic guidance, with or without sedation.  Diagnostic caudal epidural steroid injection under fluoroscopic guidance, with or without sedation.  Diagnostic caudal epidurogram under fluoroscopic guidance and an IV sedation.  Possible right ask epidural lysis of adhesions.  Diagnostic left-sided L2-3 lumbar epidural steroid injection under fluoroscopic guidance, with or without sedation.  Possible bilateral  Lumbar spinal cord stimulator trial implant.    PRN Procedures:   Diagnostic bilateral intra-articular hip joint injection under fluoroscopic guidance, without without sedation.  Diagnostic bilateral lumbar facet block under fluoroscopic guidance and IV sedation.  Diagnostic bilateral L4-L5 transforaminal epidural steroid injection under fluoroscopic guidance, with or without sedation.  Diagnostic caudal epidural steroid injection under fluoroscopic guidance, with or without sedation.  Diagnostic caudal epidurogram under fluoroscopic guidance and an IV sedation.  Diagnostic left-sided  L2-3 lumbar epidural steroid injection under fluoroscopic guidance, with or without sedation.    Requested PM Follow-up: Return in 3 months (on 10/27/2016) for Med-Mgmt, In addition, (PRN) Procedure.  Future Appointments Date Time Provider Department Center  10/27/2016 1:30 PM Delano Metz, MD [facility] None   Primary Care Physician: Margaretann Loveless, PA-C Location: [facility] Note by: Sydnee Levans. Laban Emperor, M.D, DABA, DABAPM, DABPM, DABIPP, FIPP  Pain Score Disclaimer: We use the NRS-11 scale. This is a self-reported, subjective measurement of pain severity with only modest accuracy. It is used primarily to identify changes within a particular patient. It must be understood that outpatient pain scales are significantly less accurate that those used for research, where they can be applied under ideal controlled circumstances with minimal exposure to variables. In reality, the score is likely to be a combination of pain intensity and pain affect, where pain affect describes the degree of emotional arousal or changes in action readiness caused by the sensory experience of pain. Factors such as social and work situation, setting, emotional state, anxiety levels, expectation, and prior pain experience may influence pain perception and show large inter-individual differences that may also be affected by time variables.  Patient instructions provided during this appointment: Patient Instructions   Smoking Cessation, Tips for Success If you are ready to quit smoking, congratulations! You have chosen to help yourself be healthier. Cigarettes bring nicotine, tar, carbon monoxide, and other irritants into your body. Your lungs, heart, and blood vessels will be able to work better without these poisons. There are many different ways to quit smoking. Nicotine gum, nicotine patches, a nicotine inhaler, or nicotine nasal spray can help with physical craving. Hypnosis, support  groups, and medicines help break the habit of smoking. WHAT THINGS CAN I DO TO MAKE QUITTING EASIER?  Here are some tips to help you quit for good:  Pick a date when you will quit smoking completely. Tell all of your friends and family about your plan to quit on that date.  Do not try to  slowly cut down on the number of cigarettes you are smoking. Pick a quit date and quit smoking completely starting on that day.  Throw away all cigarettes.   Clean and remove all ashtrays from your home, work, and car.  On a card, write down your reasons for quitting. Carry the card with you and read it when you get the urge to smoke.  Cleanse your body of nicotine. Drink enough water and fluids to keep your urine clear or pale yellow. Do this after quitting to flush the nicotine from your body.  Learn to predict your moods. Do not let a bad situation be your excuse to have a cigarette. Some situations in your life might tempt you into wanting a cigarette.  Never have "just one" cigarette. It leads to wanting another and another. Remind yourself of your decision to quit.  Change habits associated with smoking. If you smoked while driving or when feeling stressed, try other activities to replace smoking. Stand up when drinking your coffee. Brush your teeth after eating. Sit in a different chair when you read the paper. Avoid alcohol while trying to quit, and try to drink fewer caffeinated beverages. Alcohol and caffeine may urge you to smoke.  Avoid foods and drinks that can trigger a desire to smoke, such as sugary or spicy foods and alcohol.  Ask people who smoke not to smoke around you.  Have something planned to do right after eating or having a cup of coffee. For example, plan to take a walk or exercise.  Try a relaxation exercise to calm you down and decrease your stress. Remember, you may be tense and nervous for the first 2 weeks after you quit, but this will pass.  Find new activities to keep  your hands busy. Play with a pen, coin, or rubber band. Doodle or draw things on paper.  Brush your teeth right after eating. This will help cut down on the craving for the taste of tobacco after meals. You can also try mouthwash.   Use oral substitutes in place of cigarettes. Try using lemon drops, carrots, cinnamon sticks, or chewing gum. Keep them handy so they are available when you have the urge to smoke.  When you have the urge to smoke, try deep breathing.  Designate your home as a nonsmoking area.  If you are a heavy smoker, ask your health care provider about a prescription for nicotine chewing gum. It can ease your withdrawal from nicotine.  Reward yourself. Set aside the cigarette money you save and buy yourself something nice.  Look for support from others. Join a support group or smoking cessation program. Ask someone at home or at work to help you with your plan to quit smoking.  Always ask yourself, "Do I need this cigarette or is this just a reflex?" Tell yourself, "Today, I choose not to smoke," or "I do not want to smoke." You are reminding yourself of your decision to quit.  Do not replace cigarette smoking with electronic cigarettes (commonly called e-cigarettes). The safety of e-cigarettes is unknown, and some may contain harmful chemicals.  If you relapse, do not give up! Plan ahead and think about what you will do the next time you get the urge to smoke. HOW WILL I FEEL WHEN I QUIT SMOKING? You may have symptoms of withdrawal because your body is used to nicotine (the addictive substance in cigarettes). You may crave cigarettes, be irritable, feel very hungry, cough often, get headaches, or have  difficulty concentrating. The withdrawal symptoms are only temporary. They are strongest when you first quit but will go away within 10-14 days. When withdrawal symptoms occur, stay in control. Think about your reasons for quitting. Remind yourself that these are signs that your  body is healing and getting used to being without cigarettes. Remember that withdrawal symptoms are easier to treat than the major diseases that smoking can cause.  Even after the withdrawal is over, expect periodic urges to smoke. However, these cravings are generally short lived and will go away whether you smoke or not. Do not smoke! WHAT RESOURCES ARE AVAILABLE TO HELP ME QUIT SMOKING? Your health care provider can direct you to community resources or hospitals for support, which may include:  Group support.  Education.  Hypnosis.  Therapy.   This information is not intended to replace advice given to you by your health care provider. Make sure you discuss any questions you have with your health care provider.   Document Released: 07/08/2004 Document Revised: 10/31/2014 Document Reviewed: 03/28/2013 Elsevier Interactive Patient Education 2016 [employer] GENERAL RISKS AND COMPLICATIONS  What are the risk, side effects and possible complications? Generally speaking, most procedures are safe.  However, with any procedure there are risks, side effects, and the possibility of complications.  The risks and complications are dependent upon the sites that are lesioned, or the type of nerve block to be performed.  The closer the procedure is to the spine, the more serious the risks are.  Great care is taken when placing the radio frequency needles, block needles or lesioning probes, but sometimes complications can occur. 1. Infection: Any time there is an injection through the skin, there is a risk of infection.  This is why sterile conditions are used for these blocks.  There are four possible types of infection. 1. Localized skin infection. 2. Central Nervous System Infection-This can be in the form of Meningitis, which can be deadly. 3. Epidural Infections-This can be in the form of an epidural abscess, which can cause pressure inside of the spine, causing compression of the spinal cord  with subsequent paralysis. This would require an emergency surgery to decompress, and there are no guarantees that the patient would recover from the paralysis. 4. Discitis-This is an infection of the intervertebral discs.  It occurs in about 1% of discography procedures.  It is difficult to treat and it may lead to surgery.        2. Pain: the needles have to go through skin and soft tissues, will cause soreness.       3. Damage to internal structures:  The nerves to be lesioned may be near blood vessels or    other nerves which can be potentially damaged.       4. Bleeding: Bleeding is more common if the patient is taking blood thinners such as  aspirin, Coumadin, Ticiid, Plavix, etc., or if he/she have some genetic predisposition  such as hemophilia. Bleeding into the spinal canal can cause compression of the spinal  cord with subsequent paralysis.  This would require an emergency surgery to  decompress and there are no guarantees that the patient would recover from the  paralysis.       5. Pneumothorax:  Puncturing of a lung is a possibility, every time a needle is introduced in  the area of the chest or upper back.  Pneumothorax refers to free air around the  collapsed lung(s), inside of the thoracic cavity (chest cavity).  Another  two possible  complications related to a similar event would include: Hemothorax and Chylothorax.   These are variations of the Pneumothorax, where instead of air around the collapsed  lung(s), you may have blood or chyle, respectively.       6. Spinal headaches: They may occur with any procedures in the area of the spine.       7. Persistent CSF (Cerebro-Spinal Fluid) leakage: This is a rare problem, but may occur  with prolonged intrathecal or epidural catheters either due to the formation of a fistulous  track or a dural tear.       8. Nerve damage: By working so close to the spinal cord, there is always a possibility of  nerve damage, which could be as serious as a  permanent spinal cord injury with  paralysis.       9. Death:  Although rare, severe deadly allergic reactions known as "Anaphylactic  reaction" can occur to any of the medications used.      10. Worsening of the symptoms:  We can always make thing worse.  What are the chances of something like this happening? Chances of any of this occuring are extremely low.  By statistics, you have more of a chance of getting killed in a motor vehicle accident: while driving to the hospital than any of the above occurring .  Nevertheless, you should be aware that they are possibilities.  In general, it is similar to taking a shower.  Everybody knows that you can slip, hit your head and get killed.  Does that mean that you should not shower again?  Nevertheless always keep in mind that statistics do not mean anything if you happen to be on the wrong side of them.  Even if a procedure has a 1 (one) in a 1,000,000 (million) chance of going wrong, it you happen to be that one..Also, keep in mind that by statistics, you have more of a chance of having something go wrong when taking medications.  Who should not have this procedure? If you are on a blood thinning medication (e.g. Coumadin, Plavix, see list of "Blood Thinners"), or if you have an active infection going on, you should not have the procedure.  If you are taking any blood thinners, please inform your physician.  How should I prepare for this procedure?  Do not eat or drink anything at least six hours prior to the procedure.  Bring a driver with you .  It cannot be a taxi.  Come accompanied by an adult that can drive you back, and that is strong enough to help you if your legs get weak or numb from the local anesthetic.  Take all of your medicines the morning of the procedure with just enough water to swallow them.  If you have diabetes, make sure that you are scheduled to have your procedure done first thing in the morning, whenever possible.  If you  have diabetes, take only half of your insulin dose and notify our nurse that you have done so as soon as you arrive at the clinic.  If you are diabetic, but only take blood sugar pills (oral hypoglycemic), then do not take them on the morning of your procedure.  You may take them after you have had the procedure.  Do not take aspirin or any aspirin-containing medications, at least eleven (11) days prior to the procedure.  They may prolong bleeding.  Wear loose fitting clothing that may be easy to take  off and that you would not mind if it got stained with Betadine or blood.  Do not wear any jewelry or perfume  Remove any nail coloring.  It will interfere with some of our monitoring equipment.  NOTE: Remember that this is not meant to be interpreted as a complete list of all possible complications.  Unforeseen problems may occur.  BLOOD THINNERS The following drugs contain aspirin or other products, which can cause increased bleeding during surgery and should not be taken for 2 weeks prior to and 1 week after surgery.  If you should need take something for relief of minor pain, you may take acetaminophen which is found in Tylenol,m Datril, Anacin-3 and Panadol. It is not blood thinner. The products listed below are.  Do not take any of the products listed below in addition to any listed on your instruction sheet.  A.P.C or A.P.C with Codeine Codeine Phosphate Capsules #3 Ibuprofen Ridaura  ABC compound Congesprin Imuran rimadil  Advil Cope Indocin Robaxisal  Alka-Seltzer Effervescent Pain Reliever and Antacid Coricidin or Coricidin-D  Indomethacin Rufen  Alka-Seltzer plus Cold Medicine Cosprin Ketoprofen S-A-C Tablets  Anacin Analgesic Tablets or Capsules Coumadin Korlgesic Salflex  Anacin Extra Strength Analgesic tablets or capsules CP-2 Tablets Lanoril Salicylate  Anaprox Cuprimine Capsules Levenox Salocol  Anexsia-D Dalteparin Magan Salsalate  Anodynos Darvon compound Magnesium  Salicylate Sine-off  Ansaid Dasin Capsules Magsal Sodium Salicylate  Anturane Depen Capsules Marnal Soma  APF Arthritis pain formula Dewitt's Pills Measurin Stanback  Argesic Dia-Gesic Meclofenamic Sulfinpyrazone  Arthritis Bayer Timed Release Aspirin Diclofenac Meclomen Sulindac  Arthritis pain formula Anacin Dicumarol Medipren Supac  Analgesic (Safety coated) Arthralgen Diffunasal Mefanamic Suprofen  Arthritis Strength Bufferin Dihydrocodeine Mepro Compound Suprol  Arthropan liquid Dopirydamole Methcarbomol with Aspirin Synalgos  ASA tablets/Enseals Disalcid Micrainin Tagament  Ascriptin Doan's Midol Talwin  Ascriptin A/D Dolene Mobidin Tanderil  Ascriptin Extra Strength Dolobid Moblgesic Ticlid  Ascriptin with Codeine Doloprin or Doloprin with Codeine Momentum Tolectin  Asperbuf Duoprin Mono-gesic Trendar  Aspergum Duradyne Motrin or Motrin IB Triminicin  Aspirin plain, buffered or enteric coated Durasal Myochrisine Trigesic  Aspirin Suppositories Easprin Nalfon Trillsate  Aspirin with Codeine Ecotrin Regular or Extra Strength Naprosyn Uracel  Atromid-S Efficin Naproxen Ursinus  Auranofin Capsules Elmiron Neocylate Vanquish  Axotal Emagrin Norgesic Verin  Azathioprine Empirin or Empirin with Codeine Normiflo Vitamin E  Azolid Emprazil Nuprin Voltaren  Bayer Aspirin plain, buffered or children's or timed BC Tablets or powders Encaprin Orgaran Warfarin Sodium  Buff-a-Comp Enoxaparin Orudis Zorpin  Buff-a-Comp with Codeine Equegesic Os-Cal-Gesic   Buffaprin Excedrin plain, buffered or Extra Strength Oxalid   Bufferin Arthritis Strength Feldene Oxphenbutazone   Bufferin plain or Extra Strength Feldene Capsules Oxycodone with Aspirin   Bufferin with Codeine Fenoprofen Fenoprofen Pabalate or Pabalate-SF   Buffets II Flogesic Panagesic   Buffinol plain or Extra Strength Florinal or Florinal with Codeine Panwarfarin   Buf-Tabs Flurbiprofen Penicillamine   Butalbital Compound Four-way  cold tablets Penicillin   Butazolidin Fragmin Pepto-Bismol   Carbenicillin Geminisyn Percodan   Carna Arthritis Reliever Geopen Persantine   Carprofen Gold's salt Persistin   Chloramphenicol Goody's Phenylbutazone   Chloromycetin Haltrain Piroxlcam   Clmetidine heparin Plaquenil   Cllnoril Hyco-pap Ponstel   Clofibrate Hydroxy chloroquine Propoxyphen         Before stopping any of these medications, be sure to consult the physician who ordered them.  Some, such as Coumadin (Warfarin) are ordered to prevent or treat serious conditions such as "deep thrombosis", "pumonary embolisms",  and other heart problems.  The amount of time that you may need off of the medication may also vary with the medication and the reason for which you were taking it.  If you are taking any of these medications, please make sure you notify your pain physician before you undergo any procedures.

## 2016-07-25 NOTE — Progress Notes (Signed)
Safety precautions to be maintained throughout the outpatient stay will include: orient to surroundings, keep bed in low position, maintain call bell within reach at all times, provide assistance with transfer out of bed and ambulation.  

## 2016-07-30 LAB — TOXASSURE SELECT 13 (MW), URINE

## 2016-08-04 ENCOUNTER — Other Ambulatory Visit: Payer: Self-pay | Admitting: Pain Medicine

## 2016-08-04 DIAGNOSIS — M797 Fibromyalgia: Secondary | ICD-10-CM

## 2016-08-27 ENCOUNTER — Other Ambulatory Visit: Payer: Self-pay | Admitting: Physician Assistant

## 2016-08-27 DIAGNOSIS — I10 Essential (primary) hypertension: Secondary | ICD-10-CM

## 2016-10-27 ENCOUNTER — Encounter: Payer: Self-pay | Admitting: Pain Medicine

## 2016-10-27 ENCOUNTER — Telehealth: Payer: Self-pay | Admitting: *Deleted

## 2016-10-27 ENCOUNTER — Ambulatory Visit: Payer: Medicare Other | Attending: Pain Medicine | Admitting: Pain Medicine

## 2016-10-27 VITALS — BP 162/82 | HR 81 | Temp 98.0°F | Resp 16 | Ht 59.0 in | Wt 118.0 lb

## 2016-10-27 DIAGNOSIS — Z79891 Long term (current) use of opiate analgesic: Secondary | ICD-10-CM

## 2016-10-27 DIAGNOSIS — Z888 Allergy status to other drugs, medicaments and biological substances status: Secondary | ICD-10-CM | POA: Diagnosis not present

## 2016-10-27 DIAGNOSIS — F1190 Opioid use, unspecified, uncomplicated: Secondary | ICD-10-CM

## 2016-10-27 DIAGNOSIS — Z885 Allergy status to narcotic agent status: Secondary | ICD-10-CM | POA: Diagnosis not present

## 2016-10-27 DIAGNOSIS — I471 Supraventricular tachycardia: Secondary | ICD-10-CM | POA: Diagnosis not present

## 2016-10-27 DIAGNOSIS — M48061 Spinal stenosis, lumbar region without neurogenic claudication: Secondary | ICD-10-CM | POA: Diagnosis not present

## 2016-10-27 DIAGNOSIS — M961 Postlaminectomy syndrome, not elsewhere classified: Secondary | ICD-10-CM

## 2016-10-27 DIAGNOSIS — E876 Hypokalemia: Secondary | ICD-10-CM | POA: Insufficient documentation

## 2016-10-27 DIAGNOSIS — M79602 Pain in left arm: Secondary | ICD-10-CM | POA: Diagnosis not present

## 2016-10-27 DIAGNOSIS — M1612 Unilateral primary osteoarthritis, left hip: Secondary | ICD-10-CM | POA: Diagnosis not present

## 2016-10-27 DIAGNOSIS — Z7982 Long term (current) use of aspirin: Secondary | ICD-10-CM | POA: Diagnosis not present

## 2016-10-27 DIAGNOSIS — E559 Vitamin D deficiency, unspecified: Secondary | ICD-10-CM | POA: Insufficient documentation

## 2016-10-27 DIAGNOSIS — M79601 Pain in right arm: Secondary | ICD-10-CM | POA: Diagnosis not present

## 2016-10-27 DIAGNOSIS — H409 Unspecified glaucoma: Secondary | ICD-10-CM | POA: Diagnosis not present

## 2016-10-27 DIAGNOSIS — E538 Deficiency of other specified B group vitamins: Secondary | ICD-10-CM | POA: Insufficient documentation

## 2016-10-27 DIAGNOSIS — Z880 Allergy status to penicillin: Secondary | ICD-10-CM | POA: Insufficient documentation

## 2016-10-27 DIAGNOSIS — F17200 Nicotine dependence, unspecified, uncomplicated: Secondary | ICD-10-CM | POA: Diagnosis not present

## 2016-10-27 DIAGNOSIS — M8580 Other specified disorders of bone density and structure, unspecified site: Secondary | ICD-10-CM | POA: Diagnosis not present

## 2016-10-27 DIAGNOSIS — M797 Fibromyalgia: Secondary | ICD-10-CM | POA: Diagnosis not present

## 2016-10-27 DIAGNOSIS — K219 Gastro-esophageal reflux disease without esophagitis: Secondary | ICD-10-CM | POA: Insufficient documentation

## 2016-10-27 DIAGNOSIS — J309 Allergic rhinitis, unspecified: Secondary | ICD-10-CM | POA: Diagnosis not present

## 2016-10-27 DIAGNOSIS — M5416 Radiculopathy, lumbar region: Secondary | ICD-10-CM | POA: Diagnosis not present

## 2016-10-27 DIAGNOSIS — E7800 Pure hypercholesterolemia, unspecified: Secondary | ICD-10-CM | POA: Insufficient documentation

## 2016-10-27 DIAGNOSIS — I10 Essential (primary) hypertension: Secondary | ICD-10-CM | POA: Insufficient documentation

## 2016-10-27 DIAGNOSIS — M79604 Pain in right leg: Secondary | ICD-10-CM

## 2016-10-27 DIAGNOSIS — Z809 Family history of malignant neoplasm, unspecified: Secondary | ICD-10-CM | POA: Diagnosis not present

## 2016-10-27 DIAGNOSIS — M79605 Pain in left leg: Secondary | ICD-10-CM | POA: Diagnosis not present

## 2016-10-27 DIAGNOSIS — G894 Chronic pain syndrome: Secondary | ICD-10-CM | POA: Diagnosis not present

## 2016-10-27 DIAGNOSIS — M5441 Lumbago with sciatica, right side: Secondary | ICD-10-CM | POA: Diagnosis not present

## 2016-10-27 DIAGNOSIS — M5442 Lumbago with sciatica, left side: Secondary | ICD-10-CM

## 2016-10-27 DIAGNOSIS — G8929 Other chronic pain: Secondary | ICD-10-CM

## 2016-10-27 MED ORDER — HYDROCODONE-ACETAMINOPHEN 7.5-325 MG PO TABS: 1.0000 | ORAL_TABLET | Freq: Four times a day (QID) | ORAL | 0 refills | Status: DC | PRN

## 2016-10-27 MED ORDER — PREGABALIN 150 MG PO CAPS: 150.0000 mg | ORAL_CAPSULE | Freq: Three times a day (TID) | ORAL | 0 refills | Status: DC

## 2016-10-27 MED ORDER — MELOXICAM 15 MG PO TABS: 15.0000 mg | ORAL_TABLET | Freq: Every day | ORAL | 0 refills | Status: DC

## 2016-10-27 NOTE — Progress Notes (Signed)
Nursing Pain Medication Assessment:  Safety precautions to be maintained throughout the outpatient stay will include: orient to surroundings, keep bed in low position, maintain call bell within reach at all times, provide assistance with transfer out of bed and ambulation.  Medication Inspection Compliance: Pill count conducted under aseptic conditions, in front of the patient. Neither the pills nor the bottle was removed from the patient's sight at any time. Once count was completed pills were immediately returned to the patient in their original bottle. Pill Count: 26 of 120 pills remain Bottle Appearance: Standard pharmacy container. Clearly labeled. Medication: See above Filled Date: 12 / 11/ 2017

## 2016-10-27 NOTE — Patient Instructions (Signed)
Epidural Steroid Injection An epidural steroid injection is a shot of steroid medicine and numbing medicine that is given into the space between the spinal cord and the bones in your back (epidural space). The shot helps relieve pain caused by an irritated or swollen nerve root. The amount of pain relief you get from the injection depends on what is causing the nerve to be swollen and irritated, and how long your pain lasts. You are more likely to benefit from this injection if your pain is strong and comes on suddenly rather than if you have had pain for a long time. Tell a health care provider about:  Any allergies you have.  All medicines you are taking, including vitamins, herbs, eye drops, creams, and over-the-counter medicines.  Any problems you or family members have had with anesthetic medicines.  Any blood disorders you have.  Any surgeries you have had.  Any medical conditions you have.  Whether you are pregnant or may be pregnant. What are the risks? Generally, this is a safe procedure. However, problems may occur, including:  Headache.  Bleeding.  Infection.  Allergic reaction to medicines.  Damage to your nerves. What happens before the procedure? Staying hydrated  Follow instructions from your health care provider about hydration, which may include:  Up to 2 hours before the procedure - you may continue to drink clear liquids, such as water, clear fruit juice, black coffee, and plain tea. Eating and drinking restrictions  Follow instructions from your health care provider about eating and drinking, which may include:  8 hours before the procedure - stop eating heavy meals or foods such as meat, fried foods, or fatty foods.  6 hours before the procedure - stop eating light meals or foods, such as toast or cereal.  6 hours before the procedure - stop drinking milk or drinks that contain milk.  2 hours before the procedure - stop drinking clear  liquids. Medicine  You may be given medicines to lower anxiety.  Ask your health care provider about:  Changing or stopping your regular medicines. This is especially important if you are taking diabetes medicines or blood thinners.  Taking medicines such as aspirin and ibuprofen. These medicines can thin your blood. Do not take these medicines before your procedure if your health care provider instructs you not to. General instructions  Plan to have someone take you home from the hospital or clinic. What happens during the procedure?  You may receive a medicine to help you relax (sedative).  You will be asked to lie on your abdomen.  The injection site will be cleaned.  A numbing medicine (local anesthetic) will be used to numb the injection site.  A needle will be inserted through your skin into the epidural space. You may feel some discomfort when this happens. An X-ray machine will be used to make sure the needle is put as close as possible to the affected nerve.  A steroid medicine and a local anesthetic will be injected into the epidural space.  The needle will be removed.  A bandage (dressing) will be put over the injection site. What happens after the procedure?  Your blood pressure, heart rate, breathing rate, and blood oxygen level will be monitored until the medicines you were given have worn off.  Your arm or leg may feel weak or numb for a few hours.  The injection site may feel sore.  Do not drive for 24 hours if you received a sedative. This information   is not intended to replace advice given to you by your health care provider. Make sure you discuss any questions you have with your health care provider. Document Released: 01/17/2008 Document Revised: 03/23/2016 Document Reviewed: 01/26/2016 Elsevier Interactive Patient Education  2017 [employer] Epidural Steroid Injection An epidural steroid injection is a shot of steroid medicine and numbing medicine  that is given into the space between the spinal cord and the bones in your back (epidural space). The shot helps relieve pain caused by an irritated or swollen nerve root. The amount of pain relief you get from the injection depends on what is causing the nerve to be swollen and irritated, and how long your pain lasts. You are more likely to benefit from this injection if your pain is strong and comes on suddenly rather than if you have had pain for a long time. Tell a health care provider about:  Any allergies you have.  All medicines you are taking, including vitamins, herbs, eye drops, creams, and over-the-counter medicines.  Any problems you or family members have had with anesthetic medicines.  Any blood disorders you have.  Any surgeries you have had.  Any medical conditions you have.  Whether you are pregnant or may be pregnant. What are the risks? Generally, this is a safe procedure. However, problems may occur, including:  Headache.  Bleeding.  Infection.  Allergic reaction to medicines.  Damage to your nerves. What happens before the procedure? Staying hydrated  Follow instructions from your health care provider about hydration, which may include:  Up to 2 hours before the procedure - you may continue to drink clear liquids, such as water, clear fruit juice, black coffee, and plain tea. Eating and drinking restrictions  Follow instructions from your health care provider about eating and drinking, which may include:  8 hours before the procedure - stop eating heavy meals or foods such as meat, fried foods, or fatty foods.  6 hours before the procedure - stop eating light meals or foods, such as toast or cereal.  6 hours before the procedure - stop drinking milk or drinks that contain milk.  2 hours before the procedure - stop drinking clear liquids. Medicine  You may be given medicines to lower anxiety.  Ask your health care provider about:  Changing or  stopping your regular medicines. This is especially important if you are taking diabetes medicines or blood thinners.  Taking medicines such as aspirin and ibuprofen. These medicines can thin your blood. Do not take these medicines before your procedure if your health care provider instructs you not to. General instructions  Plan to have someone take you home from the hospital or clinic. What happens during the procedure?  You may receive a medicine to help you relax (sedative).  You will be asked to lie on your abdomen.  The injection site will be cleaned.  A numbing medicine (local anesthetic) will be used to numb the injection site.  A needle will be inserted through your skin into the epidural space. You may feel some discomfort when this happens. An X-ray machine will be used to make sure the needle is put as close as possible to the affected nerve.  A steroid medicine and a local anesthetic will be injected into the epidural space.  The needle will be removed.  A bandage (dressing) will be put over the injection site. What happens after the procedure?  Your blood pressure, heart rate, breathing rate, and blood oxygen level will be  monitored until the medicines you were given have worn off.  Your arm or leg may feel weak or numb for a few hours.  The injection site may feel sore.  Do not drive for 24 hours if you received a sedative. This information is not intended to replace advice given to you by your health care provider. Make sure you discuss any questions you have with your health care provider. Document Released: 01/17/2008 Document Revised: 03/23/2016 Document Reviewed: 01/26/2016 Elsevier Interactive Patient Education  2017 [employer]

## 2016-10-27 NOTE — Progress Notes (Signed)
Patient's Name: Suzanne Avila  MRN: [id number]  Referring Provider: Mar Daring, Mamie Nick*  DOB: [birth year]  PCP: Mar Daring, PA-C  DOS: 10/27/2016  Note by: Kathlen Brunswick. Dossie Arbour, MD  Service setting: Ambulatory outpatient  Specialty: Interventional Pain Management  Location: [facility] (AMB) Pain Management Facility    Patient type: Established   Primary Reason(s) for Visit: Encounter for prescription drug management (Level of risk: moderate) CC: Back Pain (lower)  HPI  Suzanne Avila is a 71 y.o. year old, female patient, who comes today for a medication management evaluation. 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[place]); B12 deficiency; CAFL (chronic airflow limitation) ([place]); Narrowing of intervertebral disc space; Encounter for screening for diabetes mellitus; Acid reflux; Glaucoma; History of metabolic disorder; Benign essential HTN; Fungal infection of toenail; Osteopenia; Hypercholesterolemia without hypertriglyceridemia; Compulsive tobacco user syndrome; Absence of bladder continence; Osteoarthritis of hip (Left); Chronic hip pain (Left); Chronic lumbar radicular pain (L5 dermatome) (Location of Primary Source of Pain) (Bilateral) (L>R); Lumbar facet syndrome (Location of Primary Source of Pain) (Bilateral) (L>R); Lumbar spondylosis; 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 Her primarily concern today is the Back Pain (lower)  Pain Assessment: Self-Reported Pain Score: 3 /10             Reported level is compatible with observation.       Pain Type: Chronic pain Pain Location: Back Pain Orientation: Lower Pain Descriptors / Indicators: Aching, Burning, Stabbing Pain Frequency: Constant  Suzanne Avila was last seen on 08/04/2016 for medication management. During today's appointment we reviewed Suzanne Avila's chronic pain status, as well as her outpatient medication regimen.  The patient  reports that she does not use drugs. Her body mass index is 23.83 kg/m.  Further details on both, my assessment(s), as well as the proposed treatment plan, please see below.  Controlled Substance Pharmacotherapy Assessment REMS (Risk Evaluation and Mitigation Strategy)  Analgesic:Hydrocodone/APAP 7.5/325 one every 6 hours (30 mg per day) MME/day:30 mg/day.  Suzanne Specking, RN  10/27/2016  1:46 PM  Sign at close encounter Nursing Pain Medication Assessment:  Safety precautions to be maintained throughout the outpatient stay will include: orient to surroundings, keep bed in low position, maintain call bell within reach at all times, provide assistance with transfer out of bed and ambulation.  Medication Inspection Compliance: Pill count conducted under aseptic conditions, in front of the patient. Neither the pills nor the bottle was removed from the patient's sight at any time. Once count was completed pills were immediately returned to the patient in their original bottle. Pill Count: 26 of 120 pills remain Bottle Appearance: Standard pharmacy container. Clearly labeled. Medication: See above Filled Date: 12 / 11/ 2017   Pharmacokinetics: Liberation and absorption (onset of action): WNL Distribution (time to peak  effect): WNL Metabolism and excretion (duration of action): WNL         Pharmacodynamics: Desired  effects: Analgesia: Ms. Reach reports >50% benefit. Functional ability: Patient reports that medication allows her to accomplish basic ADLs Clinically meaningful improvement in function (CMIF): Sustained CMIF goals met Perceived effectiveness: Described as relatively effective, allowing for increase in activities of daily living (ADL) Undesirable effects: Side-effects or Adverse reactions: None reported Monitoring:  PMP: Online review of the past 35-monthperiod conducted. Compliant with practice rules and regulations List of all UDS test(s) done:  Lab Results  Component Value Date   TOXASSSELUR FINAL 07/25/2016   TOXASSSELUR FINAL 04/20/2016   TOXASSSELUR FINAL 11/11/2015   Last UDS on record: ToxAssure Select 13  Date Value Ref Range Status  07/25/2016 FINAL  Final    Comment:    ==================================================================== TOXASSURE SELECT 13 (MW) ==================================================================== Test                             Result       Flag       Units Drug Present and Declared for Prescription Verification   Hydrocodone                    3261         EXPECTED   ng/mg creat   Hydromorphone                  1368         EXPECTED   ng/mg creat   Dihydrocodeine                 205          EXPECTED   ng/mg creat   Norhydrocodone                 1864         EXPECTED   ng/mg creat    Sources of hydrocodone include scheduled prescription    medications. Hydromorphone, dihydrocodeine and norhydrocodone are    expected metabolites of hydrocodone. Hydromorphone and    dihydrocodeine are also available as scheduled prescription    medications. ==================================================================== Test                      Result    Flag   Units      Ref Range   Creatinine              44               mg/dL       >=20 ==================================================================== Declared Medications:  The flagging and interpretation on this report are based on the  following declared medications.  Unexpected results may arise from  inaccuracies in the declared medications.  **Note: The testing scope of this panel includes these medications:  Hydrocodone (Norco)  **Note: The testing scope of this panel does not include following  reported medications:  Acetaminophen (Norco)  Albuterol  Aspirin  Atorvastatin (Lipitor)  Cyanocobalamin  Diltiazem  Dorzolamide  Fluticasone (Advair)  Latanoprost (Xalatan)  Meloxicam (Mobic)  Potassium (Klor-Con)  Pregabalin (Lyrica)  Ramipril (Altace)  Salmeterol (Advair)  Spironolactone (Aldactone)  Vitamin D ==================================================================== For clinical consultation, please call ([phone] ====================================================================    UDS interpretation: Compliant          Medication Assessment Form: Reviewed. Patient indicates being compliant with therapy Treatment compliance: Compliant Risk Assessment Profile: Aberrant behavior: See prior evaluations. None observed or detected today Comorbid factors increasing risk of overdose: See prior notes. No additional  risks detected today Risk of substance use disorder (SUD): Low Opioid Risk Tool (ORT) Total Score: 0  Interpretation Table:  Score <3 = Low Risk for SUD  Score between 4-7 = Moderate Risk for SUD  Score >8 = High Risk for Opioid Abuse   Risk Mitigation Strategies:  Patient Counseling: Covered Patient-Prescriber Agreement (PPA): Present and active  Notification to other healthcare providers: Done  Pharmacologic Plan: No change in therapy, at this time  Laboratory Chemistry  Inflammation Markers Lab Results  Component Value Date   ESRSEDRATE 23 11/11/2015   CRP <0.5 11/11/2015   Renal Function Lab Results   Component Value Date   BUN 9 06/14/2016   CREATININE 0.71 06/14/2016   GFRAA 99 06/14/2016   GFRNONAA 86 06/14/2016   Hepatic Function Lab Results  Component Value Date   AST 24 06/14/2016   ALT 15 06/14/2016   ALBUMIN 4.4 06/14/2016   Electrolytes Lab Results  Component Value Date   NA 139 06/14/2016   K 4.3 06/14/2016   CL 97 06/14/2016   CALCIUM 9.6 06/14/2016   MG 1.8 11/11/2015   Pain Modulating Vitamins Lab Results  Component Value Date   VD25OH 32.7 06/14/2016   VITAMINB12 1,220 (H) 06/14/2016   Coagulation Parameters Lab Results  Component Value Date   PLT 267 06/14/2016   Cardiovascular Lab Results  Component Value Date   BNP 78.2 06/14/2016   HGB 16.0 03/22/2014   HCT 43.0 06/14/2016   Note: Lab results reviewed.  Recent Diagnostic Imaging Review  US Abdomen Limited  Result Date: 09/22/2014 * PRIOR REPORT IMPORTED FROM AN EXTERNAL SYSTEM * CLINICAL DATA:  Elevated liver enzymes EXAM: US ABDOMEN LIMITED - RIGHT UPPER QUADRANT COMPARISON:  None. FINDINGS: Gallbladder: No gallstones or wall thickening visualized. There is no pericholecystic fluid. No sonographic Murphy sign noted. Common bile duct: Diameter: 6 mm. There is no intrahepatic or extrahepatic biliary duct dilatation. Liver: No focal lesion identified. Liver echogenicity is mildly increased and inhomogeneous. IMPRESSION: Liver echogenicity is mildly increased and inhomogeneous. This appearance is most likely due to hepatic steatosis. While no focal liver lesions are identified, it must be cautioned that the sensitivity of ultrasound for focal liver lesions is diminished in this circumstance. Study otherwise unremarkable. Electronically Signed   By: Lowella Grip M.D.   On: 09/22/2014 09:19    Note: Imaging results reviewed.          Meds  The patient has a current medication list which includes the following prescription(s): albuterol, aspirin, atorvastatin, cholecalciferol, diltiazem,  dorzolamide, fluticasone-salmeterol, hydrocodone-acetaminophen, hydrocodone-acetaminophen, hydrocodone-acetaminophen, klor-con m20, latanoprost, meloxicam, pregabalin, ramipril, spironolactone, and vitamin b-12.  Current Outpatient Prescriptions on File Prior to Visit  Medication Sig  . albuterol (PROVENTIL HFA;VENTOLIN HFA) 108 (90 Base) MCG/ACT inhaler ALBUTEROL, 90MCG/ACT (Inhalation Aerosol Solution)  2 PUFFS Every Day, As Needed for 0 days  Quantity: 0.00;  Refills: 0   Ordered :09-Jan-2014  Celene Kras, MA, [place]ast[place] ;  Started 10-Aug-2009 Active Comments: Medication taken as needed.   Marland Kitchen aspirin 325 MG tablet Take 325 mg by mouth daily.  Marland Kitchen atorvastatin (LIPITOR) 40 MG tablet Take 40 mg by mouth daily.  . cholecalciferol (VITAMIN D) 1000 units tablet Take 1 tablet (1,000 Units total) by mouth daily.  Marland Kitchen diltiazem (CARDIZEM) 120 MG tablet Take 1 tablet (120 mg total) by mouth daily.  . dorzolamide (TRUSOPT) 2 % ophthalmic solution   . Fluticasone-Salmeterol (ADVAIR DISKUS) 250-50 MCG/DOSE AEPB USE 1 INHALATION TWICE A DAY  . KLOR-CON M20 20  MEQ tablet Take 20 mEq by mouth daily.   Marland Kitchen latanoprost (XALATAN) 0.005 % ophthalmic solution 1 drop at bedtime.  . ramipril (ALTACE) 10 MG capsule TAKE 1 CAPSULE DAILY FOR BLOOD PRESSURE  . spironolactone (ALDACTONE) 25 MG tablet Take 1 tablet (25 mg total) by mouth daily.  . vitamin B-12 (CYANOCOBALAMIN) 500 MCG tablet Take 500 mcg by mouth daily.   No current facility-administered medications on file prior to visit.    ROS  Constitutional: Denies any fever or chills Gastrointestinal: No reported hemesis, hematochezia, vomiting, or acute GI distress Musculoskeletal: Denies any acute onset joint swelling, redness, loss of ROM, or weakness Neurological: No reported episodes of acute onset apraxia, aphasia, dysarthria, agnosia, amnesia, paralysis, loss of coordination, or loss of consciousness  Allergies  Ms. Yaden is allergic to codeine;  cyclobenzaprine; and penicillins.  [place]  Drug: Ms. Visscher  reports that she does not use drugs. Alcohol:  reports that she drinks alcohol. Tobacco:  reports that she has been smoking Cigarettes.  She has a 50.00 pack-year smoking history. She has never used smokeless tobacco. Medical:  has a past medical history of Glaucoma and Hypertension. Family: family history includes Cancer in her father and mother.  Past Surgical History:  Procedure Laterality Date  . BACK SURGERY    . CARPAL TUNNEL RELEASE Left   . EYE SURGERY     Constitutional Exam  General appearance: Well nourished, well developed, and well hydrated. In no apparent acute distress Vitals:   10/27/16 1332  BP: (!) 162/82  Pulse: 81  Resp: 16  Temp: 98 F (36.7 C)  SpO2: 98%  Weight: 118 lb (53.5 kg)  Height: '4\' 11"'$  (1.499 m)   BMI Assessment: Estimated body mass index is 23.83 kg/m as calculated from the following:   Height as of this encounter: '4\' 11"'$  (1.499 m).   Weight as of this encounter: 118 lb (53.5 kg).  BMI interpretation table: BMI level Category Range association with higher incidence of chronic pain  <18 kg/m2 Underweight   18.5-24.9 kg/m2 Ideal body weight   25-29.9 kg/m2 Overweight Increased incidence by 20%  30-34.9 kg/m2 Obese (Class I) Increased incidence by 68%  35-39.9 kg/m2 Severe obesity (Class II) Increased incidence by 136%  >40 kg/m2 Extreme obesity (Class III) Increased incidence by 254%   BMI Readings from Last 4 Encounters:  10/27/16 23.83 kg/m  07/25/16 23.83 kg/m  06/13/16 24.83 kg/m  05/25/16 25.37 kg/m   Wt Readings from Last 4 Encounters:  10/27/16 118 lb (53.5 kg)  07/25/16 118 lb (53.5 kg)  06/13/16 118 lb 12.8 oz (53.9 kg)  05/25/16 121 lb 6.4 oz (55.1 kg)  Psych/Mental status: Alert, oriented x 3 (person, place, & time) Eyes: PERLA Respiratory: No evidence of acute respiratory distress  Cervical Spine Exam  Inspection: No masses, redness, or  swelling Alignment: Symmetrical Functional ROM: Unrestricted ROM Stability: No instability detected Muscle strength & Tone: Functionally intact Sensory: Unimpaired Palpation: Non-contributory  Upper Extremity (UE) Exam    Side: Right upper extremity  Side: Left upper extremity  Inspection: No masses, redness, swelling, or asymmetry  Inspection: No masses, redness, swelling, or asymmetry  Functional ROM: Unrestricted ROM          Functional ROM: Unrestricted ROM          Muscle strength & Tone: Functionally intact  Muscle strength & Tone: Functionally intact  Sensory: Unimpaired  Sensory: Unimpaired  Palpation: Non-contributory  Palpation: Non-contributory   Thoracic Spine Exam  Inspection: No  masses, redness, or swelling Alignment: Symmetrical Functional ROM: Unrestricted ROM Stability: No instability detected Sensory: Unimpaired Muscle strength & Tone: Functionally intact Palpation: Non-contributory  Lumbar Spine Exam  Inspection: Well healed scar from previous spine surgery detected Alignment: Symmetrical Functional ROM: Decreased ROM Stability: No instability detected Muscle strength & Tone: Functionally intact Sensory: Movement-associated discomfort Palpation: Non-contributory Provocative Tests: Lumbar Hyperextension and rotation test: evaluation deferred today       Patrick's Maneuver: evaluation deferred today              Gait & Posture Assessment  Ambulation: Unassisted Gait: Relatively normal for age and body habitus Posture: WNL   Lower Extremity Exam    Side: Right lower extremity  Side: Left lower extremity  Inspection: No masses, redness, swelling, or asymmetry  Inspection: No masses, redness, swelling, or asymmetry  Functional ROM: Unrestricted ROM          Functional ROM: Unrestricted ROM          Muscle strength & Tone: Functionally intact  Muscle strength & Tone: Functionally intact  Sensory: Unimpaired  Sensory: Unimpaired  Palpation: Non-contributory   Palpation: Non-contributory   Assessment  Primary Diagnosis & Pertinent Problem List: The primary encounter diagnosis was Chronic pain syndrome. 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  Status Diagnosis   Stable  Worsened  Stable 1. Chronic pain syndrome   2. Chronic lower extremity pain (Location of Primary Source of Pain) (Bilateral) (L>R)   3. Chronic low back pain (Location of Secondary source of pain) (Bilateral) (L>R)   4. Chronic lumbar radicular pain (L5 dermatome) (Location of Primary Source of Pain) (Bilateral) (L>R)   5. Chronic upper extremity pain (Location of Tertiary source of pain) (Bilateral) (L>R)   6. 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 Long term current use of opiate analgesic   8. Opiate use (30 MME/Day)   9. Arthropathy of left hip   10. Fibromyalgia      Plan of Care  Pharmacotherapy (Medications Ordered): Meds ordered this encounter  Medications  . HYDROcodone-acetaminophen (NORCO) 7.5-325 MG tablet    Sig: Take 1 tablet by mouth every 6 (six) hours as needed for severe pain.    Dispense:  120 tablet    Refill:  0    Do not place this medication, or any other prescription from our practice, on "Automatic Refill". Patient may have prescription filled one day early if pharmacy is closed on scheduled refill date. Do not fill until: 11/03/16 To last until: 12/03/16  . HYDROcodone-acetaminophen (NORCO) 7.5-325 MG tablet    Sig: Take 1 tablet by mouth every 6  (six) hours as needed for severe pain.    Dispense:  120 tablet    Refill:  0    Do not place this medication, or any other prescription from our practice, on "Automatic Refill". Patient may have prescription filled one day early if pharmacy is closed on scheduled refill date. Do not fill until: 12/03/16 To last until: 01/02/17  . HYDROcodone-acetaminophen (NORCO) 7.5-325 MG tablet    Sig: Take 1  tablet by mouth every 6 (six) hours as needed for severe pain.    Dispense:  120 tablet    Refill:  0    Do not place this medication, or any other prescription from our practice, on "Automatic Refill". Patient may have prescription filled one day early if pharmacy is closed on scheduled refill date. Do not fill until: 01/02/17 To last until: 02/01/17  . meloxicam (MOBIC) 15 MG tablet    Sig: Take 1 tablet (15 mg total) by mouth daily.    Dispense:  90 tablet    Refill:  0    Patient may have prescription filled one day early if pharmacy is closed on scheduled refill date. 3 month refill.  . pregabalin (LYRICA) 150 MG capsule    Sig: Take 1 capsule (150 mg total) by mouth every 8 (eight) hours.    Dispense:  270 capsule    Refill:  0    Do not place this medication, or any other prescription from our practice, on "Automatic Refill". Patient may have prescription filled one day early if pharmacy is closed on scheduled refill date. 3 month supply.   New Prescriptions   No medications on file   Medications administered today: Ms. Giuliano had no medications administered during this visit. Lab-work, procedure(s), and/or referral(s): Orders Placed This Encounter  Procedures  . Caudal Epidural Injection  . CT LUMBAR SPINE WO CONTRAST   Imaging and/or referral(s): CT LUMBAR SPINE WO CONTRAST  Interventional therapies: Planned, scheduled, and/or pending:   Caudal ESI + Epidurogram under fluoro and IV sedation.   Considering:   Diagnostic bilateral intra-articular hip joint injection under  fluoroscopic guidance, without without sedation. Possible bilateral hip joint radiofrequency ablation. Diagnostic bilateral lumbar facet block under fluoroscopic guidance and IV sedation. Possible bilateral lumbar facet radiofrequency ablation under fluoroscopic guidance and IV sedation.  Diagnostic bilateral L4-L5 transforaminal epidural steroid injection under fluoroscopic guidance, with or without sedation.  Diagnostic caudal epidural steroid injection under fluoroscopic guidance, with or without sedation.  Diagnostic caudal epidurogram under fluoroscopic guidance and an IV sedation.  Possible right ask epidural lysis of adhesions.  Diagnostic left-sided L2-3 lumbar epidural steroid injection under fluoroscopic guidance, with or without sedation.  Possible bilateral Lumbar spinal cord stimulator trial implant.    Palliative PRN treatment(s):   Diagnostic bilateral intra-articular hip joint injection under fluoroscopic guidance, without without sedation.  Diagnostic bilateral lumbar facet block under fluoroscopic guidance and IV sedation.  Diagnostic bilateral L4-L5 transforaminal epidural steroid injection under fluoroscopic guidance, with or without sedation.  Diagnostic caudal epidural steroid injection under fluoroscopic guidance, with or without sedation.  Diagnostic caudal epidurogram under fluoroscopic guidance and an IV sedation.  Diagnostic left-sided L2-3 lumbar epidural steroid injection under fluoroscopic guidance, with or without sedation.    Provider-requested follow-up: Return in about 3 months (around 01/25/2017) for (NP) Med-Mgmt, in addition, procedure (ASAA): Caudal ESI..  Future Appointments Date Time Provider [place]  11/08/2016 11:00 AM [facility]  11/16/2016 10:15 AM Milinda Pointer, MD [facility] None   Primary Care Physician: Mar Daring, PA-C Location: [facility] Note by: Kathlen Brunswick. Dossie Arbour, M.D, DABA,  DABAPM, DABPM, DABIPP, FIPP Date: 10/27/16; Time: 2:40 PM  Pain Score Disclaimer: We use the NRS-11 scale. This is a self-reported, subjective measurement of pain severity with only modest accuracy. It is used primarily to identify changes within a particular patient. It must be understood that outpatient pain scales are significantly less accurate that those used for  research, where they can be applied under ideal controlled circumstances with minimal exposure to variables. In reality, the score is likely to be a combination of pain intensity and pain affect, where pain affect describes the degree of emotional arousal or changes in action readiness caused by the sensory experience of pain. Factors such as social and work situation, setting, emotional state, anxiety levels, expectation, and prior pain experience may influence pain perception and show large inter-individual differences that may also be affected by time variables.  Patient instructions provided during this appointment: Patient Instructions  Epidural Steroid Injection An epidural steroid injection is a shot of steroid medicine and numbing medicine that is given into the space between the spinal cord and the bones in your back (epidural space). The shot helps relieve pain caused by an irritated or swollen nerve root. The amount of pain relief you get from the injection depends on what is causing the nerve to be swollen and irritated, and how long your pain lasts. You are more likely to benefit from this injection if your pain is strong and comes on suddenly rather than if you have had pain for a long time. Tell a health care provider about:  Any allergies you have.  All medicines you are taking, including vitamins, herbs, eye drops, creams, and over-the-counter medicines.  Any problems you or family members have had with anesthetic medicines.  Any blood disorders you have.  Any surgeries you have had.  Any medical conditions you  have.  Whether you are pregnant or may be pregnant. What are the risks? Generally, this is a safe procedure. However, problems may occur, including:  Headache.  Bleeding.  Infection.  Allergic reaction to medicines.  Damage to your nerves. What happens before the procedure? Staying hydrated  Follow instructions from your health care provider about hydration, which may include:  Up to 2 hours before the procedure - you may continue to drink clear liquids, such as water, clear fruit juice, black coffee, and plain tea. Eating and drinking restrictions  Follow instructions from your health care provider about eating and drinking, which may include:  8 hours before the procedure - stop eating heavy meals or foods such as meat, fried foods, or fatty foods.  6 hours before the procedure - stop eating light meals or foods, such as toast or cereal.  6 hours before the procedure - stop drinking milk or drinks that contain milk.  2 hours before the procedure - stop drinking clear liquids. Medicine  You may be given medicines to lower anxiety.  Ask your health care provider about:  Changing or stopping your regular medicines. This is especially important if you are taking diabetes medicines or blood thinners.  Taking medicines such as aspirin and ibuprofen. These medicines can thin your blood. Do not take these medicines before your procedure if your health care provider instructs you not to. General instructions  Plan to have someone take you home from the hospital or clinic. What happens during the procedure?  You may receive a medicine to help you relax (sedative).  You will be asked to lie on your abdomen.  The injection site will be cleaned.  A numbing medicine (local anesthetic) will be used to numb the injection site.  A needle will be inserted through your skin into the epidural space. You may feel some discomfort when this happens. An X-ray machine will be used to  make sure the needle is put as close as possible to the affected  nerve.  A steroid medicine and a local anesthetic will be injected into the epidural space.  The needle will be removed.  A bandage (dressing) will be put over the injection site. What happens after the procedure?  Your blood pressure, heart rate, breathing rate, and blood oxygen level will be monitored until the medicines you were given have worn off.  Your arm or leg may feel weak or numb for a few hours.  The injection site may feel sore.  Do not drive for 24 hours if you received a sedative. This information is not intended to replace advice given to you by your health care provider. Make sure you discuss any questions you have with your health care provider. Document Released: 01/17/2008 Document Revised: 03/23/2016 Document Reviewed: 01/26/2016 Elsevier Interactive Patient Education  2017 [employer] Epidural Steroid Injection An epidural steroid injection is a shot of steroid medicine and numbing medicine that is given into the space between the spinal cord and the bones in your back (epidural space). The shot helps relieve pain caused by an irritated or swollen nerve root. The amount of pain relief you get from the injection depends on what is causing the nerve to be swollen and irritated, and how long your pain lasts. You are more likely to benefit from this injection if your pain is strong and comes on suddenly rather than if you have had pain for a long time. Tell a health care provider about:  Any allergies you have.  All medicines you are taking, including vitamins, herbs, eye drops, creams, and over-the-counter medicines.  Any problems you or family members have had with anesthetic medicines.  Any blood disorders you have.  Any surgeries you have had.  Any medical conditions you have.  Whether you are pregnant or may be pregnant. What are the risks? Generally, this is a safe procedure. However,  problems may occur, including:  Headache.  Bleeding.  Infection.  Allergic reaction to medicines.  Damage to your nerves. What happens before the procedure? Staying hydrated  Follow instructions from your health care provider about hydration, which may include:  Up to 2 hours before the procedure - you may continue to drink clear liquids, such as water, clear fruit juice, black coffee, and plain tea. Eating and drinking restrictions  Follow instructions from your health care provider about eating and drinking, which may include:  8 hours before the procedure - stop eating heavy meals or foods such as meat, fried foods, or fatty foods.  6 hours before the procedure - stop eating light meals or foods, such as toast or cereal.  6 hours before the procedure - stop drinking milk or drinks that contain milk.  2 hours before the procedure - stop drinking clear liquids. Medicine  You may be given medicines to lower anxiety.  Ask your health care provider about:  Changing or stopping your regular medicines. This is especially important if you are taking diabetes medicines or blood thinners.  Taking medicines such as aspirin and ibuprofen. These medicines can thin your blood. Do not take these medicines before your procedure if your health care provider instructs you not to. General instructions  Plan to have someone take you home from the hospital or clinic. What happens during the procedure?  You may receive a medicine to help you relax (sedative).  You will be asked to lie on your abdomen.  The injection site will be cleaned.  A numbing medicine (local anesthetic) will be used to numb  the injection site.  A needle will be inserted through your skin into the epidural space. You may feel some discomfort when this happens. An X-ray machine will be used to make sure the needle is put as close as possible to the affected nerve.  A steroid medicine and a local anesthetic will be  injected into the epidural space.  The needle will be removed.  A bandage (dressing) will be put over the injection site. What happens after the procedure?  Your blood pressure, heart rate, breathing rate, and blood oxygen level will be monitored until the medicines you were given have worn off.  Your arm or leg may feel weak or numb for a few hours.  The injection site may feel sore.  Do not drive for 24 hours if you received a sedative. This information is not intended to replace advice given to you by your health care provider. Make sure you discuss any questions you have with your health care provider. Document Released: 01/17/2008 Document Revised: 03/23/2016 Document Reviewed: 01/26/2016 Elsevier Interactive Patient Education  2017 [employer].

## 2016-11-08 ENCOUNTER — Ambulatory Visit: Payer: Medicare Other

## 2016-11-16 ENCOUNTER — Ambulatory Visit: Payer: Medicare Other | Admitting: Pain Medicine

## 2016-11-18 ENCOUNTER — Ambulatory Visit
Admission: RE | Admit: 2016-11-18 | Discharge: 2016-11-18 | Disposition: A | Discharge status: Home / Self Care | Payer: Medicare Other | Source: Ambulatory Visit | Attending: Pain Medicine | Admitting: Pain Medicine

## 2016-11-18 DIAGNOSIS — M5416 Radiculopathy, lumbar region: Secondary | ICD-10-CM | POA: Diagnosis not present

## 2016-11-18 DIAGNOSIS — Z9889 Other specified postprocedural states: Secondary | ICD-10-CM | POA: Diagnosis not present

## 2016-11-18 DIAGNOSIS — M961 Postlaminectomy syndrome, not elsewhere classified: Secondary | ICD-10-CM | POA: Insufficient documentation

## 2016-11-18 DIAGNOSIS — M47897 Other spondylosis, lumbosacral region: Secondary | ICD-10-CM | POA: Diagnosis not present

## 2016-11-18 DIAGNOSIS — G8929 Other chronic pain: Secondary | ICD-10-CM | POA: Insufficient documentation

## 2016-11-18 DIAGNOSIS — M79605 Pain in left leg: Secondary | ICD-10-CM | POA: Insufficient documentation

## 2016-11-18 DIAGNOSIS — M79604 Pain in right leg: Secondary | ICD-10-CM | POA: Insufficient documentation

## 2016-11-18 DIAGNOSIS — N261 Atrophy of kidney (terminal): Secondary | ICD-10-CM | POA: Diagnosis not present

## 2016-11-18 DIAGNOSIS — M545 Low back pain: Secondary | ICD-10-CM | POA: Diagnosis not present

## 2016-11-18 DIAGNOSIS — M47896 Other spondylosis, lumbar region: Secondary | ICD-10-CM | POA: Insufficient documentation

## 2017-01-09 DIAGNOSIS — H401131 Primary open-angle glaucoma, bilateral, mild stage: Secondary | ICD-10-CM | POA: Diagnosis not present

## 2017-01-17 DIAGNOSIS — H401131 Primary open-angle glaucoma, bilateral, mild stage: Secondary | ICD-10-CM | POA: Diagnosis not present

## 2017-01-19 ENCOUNTER — Ambulatory Visit: Payer: Medicare Other | Admitting: Pain Medicine

## 2017-01-31 ENCOUNTER — Ambulatory Visit: Payer: Medicare Other | Attending: Pain Medicine | Admitting: Pain Medicine

## 2017-01-31 ENCOUNTER — Encounter: Payer: Self-pay | Admitting: Pain Medicine

## 2017-01-31 VITALS — BP 160/70 | HR 78 | Temp 97.1°F | Resp 16 | Ht 59.0 in | Wt 118.0 lb

## 2017-01-31 DIAGNOSIS — Z7982 Long term (current) use of aspirin: Secondary | ICD-10-CM | POA: Diagnosis not present

## 2017-01-31 DIAGNOSIS — E538 Deficiency of other specified B group vitamins: Secondary | ICD-10-CM | POA: Diagnosis not present

## 2017-01-31 DIAGNOSIS — E7800 Pure hypercholesterolemia, unspecified: Secondary | ICD-10-CM | POA: Insufficient documentation

## 2017-01-31 DIAGNOSIS — M1288 Other specific arthropathies, not elsewhere classified, other specified site: Secondary | ICD-10-CM

## 2017-01-31 DIAGNOSIS — M5442 Lumbago with sciatica, left side: Secondary | ICD-10-CM | POA: Diagnosis not present

## 2017-01-31 DIAGNOSIS — E559 Vitamin D deficiency, unspecified: Secondary | ICD-10-CM | POA: Diagnosis not present

## 2017-01-31 DIAGNOSIS — E876 Hypokalemia: Secondary | ICD-10-CM | POA: Diagnosis not present

## 2017-01-31 DIAGNOSIS — J309 Allergic rhinitis, unspecified: Secondary | ICD-10-CM | POA: Insufficient documentation

## 2017-01-31 DIAGNOSIS — J449 Chronic obstructive pulmonary disease, unspecified: Secondary | ICD-10-CM | POA: Diagnosis not present

## 2017-01-31 DIAGNOSIS — Z888 Allergy status to other drugs, medicaments and biological substances status: Secondary | ICD-10-CM | POA: Insufficient documentation

## 2017-01-31 DIAGNOSIS — Z79891 Long term (current) use of opiate analgesic: Secondary | ICD-10-CM

## 2017-01-31 DIAGNOSIS — I10 Essential (primary) hypertension: Secondary | ICD-10-CM | POA: Insufficient documentation

## 2017-01-31 DIAGNOSIS — G894 Chronic pain syndrome: Secondary | ICD-10-CM | POA: Diagnosis not present

## 2017-01-31 DIAGNOSIS — Z809 Family history of malignant neoplasm, unspecified: Secondary | ICD-10-CM | POA: Insufficient documentation

## 2017-01-31 DIAGNOSIS — Z885 Allergy status to narcotic agent status: Secondary | ICD-10-CM | POA: Diagnosis not present

## 2017-01-31 DIAGNOSIS — M961 Postlaminectomy syndrome, not elsewhere classified: Secondary | ICD-10-CM | POA: Insufficient documentation

## 2017-01-31 DIAGNOSIS — M1612 Unilateral primary osteoarthritis, left hip: Secondary | ICD-10-CM | POA: Diagnosis not present

## 2017-01-31 DIAGNOSIS — M8580 Other specified disorders of bone density and structure, unspecified site: Secondary | ICD-10-CM | POA: Diagnosis not present

## 2017-01-31 DIAGNOSIS — F1190 Opioid use, unspecified, uncomplicated: Secondary | ICD-10-CM | POA: Diagnosis not present

## 2017-01-31 DIAGNOSIS — M47816 Spondylosis without myelopathy or radiculopathy, lumbar region: Secondary | ICD-10-CM

## 2017-01-31 DIAGNOSIS — Z880 Allergy status to penicillin: Secondary | ICD-10-CM | POA: Insufficient documentation

## 2017-01-31 DIAGNOSIS — K219 Gastro-esophageal reflux disease without esophagitis: Secondary | ICD-10-CM | POA: Insufficient documentation

## 2017-01-31 DIAGNOSIS — R209 Unspecified disturbances of skin sensation: Secondary | ICD-10-CM

## 2017-01-31 DIAGNOSIS — M797 Fibromyalgia: Secondary | ICD-10-CM

## 2017-01-31 DIAGNOSIS — M5441 Lumbago with sciatica, right side: Secondary | ICD-10-CM | POA: Diagnosis not present

## 2017-01-31 DIAGNOSIS — I471 Supraventricular tachycardia: Secondary | ICD-10-CM | POA: Diagnosis not present

## 2017-01-31 DIAGNOSIS — H409 Unspecified glaucoma: Secondary | ICD-10-CM | POA: Insufficient documentation

## 2017-01-31 DIAGNOSIS — G8929 Other chronic pain: Secondary | ICD-10-CM | POA: Diagnosis not present

## 2017-01-31 DIAGNOSIS — M48061 Spinal stenosis, lumbar region without neurogenic claudication: Secondary | ICD-10-CM | POA: Insufficient documentation

## 2017-01-31 DIAGNOSIS — F17200 Nicotine dependence, unspecified, uncomplicated: Secondary | ICD-10-CM | POA: Diagnosis not present

## 2017-01-31 DIAGNOSIS — M5414 Radiculopathy, thoracic region: Secondary | ICD-10-CM

## 2017-01-31 DIAGNOSIS — Z9889 Other specified postprocedural states: Secondary | ICD-10-CM | POA: Insufficient documentation

## 2017-01-31 MED ORDER — HYDROCODONE-ACETAMINOPHEN 7.5-325 MG PO TABS: 1.0000 | ORAL_TABLET | Freq: Four times a day (QID) | ORAL | 0 refills | Status: DC | PRN

## 2017-01-31 MED ORDER — MELOXICAM 15 MG PO TABS: 15.0000 mg | ORAL_TABLET | Freq: Every day | ORAL | 0 refills | Status: DC

## 2017-01-31 MED ORDER — PREGABALIN 150 MG PO CAPS: 150.0000 mg | ORAL_CAPSULE | Freq: Three times a day (TID) | ORAL | 0 refills | Status: DC

## 2017-01-31 NOTE — Progress Notes (Signed)
Nursing Pain Medication Assessment:  Safety precautions to be maintained throughout the outpatient stay will include: orient to surroundings, keep bed in low position, maintain call bell within reach at all times, provide assistance with transfer out of bed and ambulation.  Medication Inspection Compliance: Pill count conducted under aseptic conditions, in front of the patient. Neither the pills nor the bottle was removed from the patient's sight at any time. Once count was completed pills were immediately returned to the patient in their original bottle.  Medication: Hydrocodone/APAP Pill/Patch Count: 16 of 120 pills remain Pill/Patch Appearance: Markings consistent with prescribed medication Bottle Appearance: Standard pharmacy container. Clearly labeled. Filled Date:03/12/ 2018 Last Medication intake:  Today

## 2017-01-31 NOTE — Patient Instructions (Addendum)
_______________________________________________________________  Preparing for Procedure with Sedation Instructions: . Oral Intake: Do not eat or drink anything for at least 8 hours prior to your procedure. . Transportation: Public transportation is not allowed. Bring an adult driver. The driver must be physically present in our waiting room before any procedure can be started. . Physical Assistance: Bring an adult physically capable of assisting you, in the event you need help. This adult should keep you company at home for at least 6 hours after the procedure. . Blood Pressure Medicine: Take your blood pressure medicine with a sip of water the morning of the procedure. . Blood thinners:  . Diabetics on insulin: Notify the staff so that you can be scheduled 1st case in the morning. If your diabetes requires high dose insulin, take only  of your normal insulin dose the morning of the procedure and notify the staff that you have done so. . Preventing infections: Shower with an antibacterial soap the morning of your procedure. . Build-up your immune system: Take 1000 mg of Vitamin C with every meal (3 times a day) the day prior to your procedure. . Antibiotics: Inform the staff if you have a condition or reason that requires you to take antibiotics before dental procedures. . Pregnancy: If you are pregnant, call and cancel the procedure. . Sickness: If you have a cold, fever, or any active infections, call and cancel the procedure. . Arrival: You must be in the facility at least 30 minutes prior to your scheduled procedure. . Children: Do not bring children with you. . Dress appropriately: Bring dark clothing that you would not mind if they get stained. . Valuables: Do not bring any jewelry or valuables. Procedure appointments are reserved for interventional treatments only. . No Prescription Refills. . No medication changes will be discussed during procedure appointments. . No disability issues  will be discussed. ______________________________________________________________________________________________  GENERAL RISKS AND COMPLICATIONS  What are the risk, side effects and possible complications? Generally speaking, most procedures are safe.  However, with any procedure there are risks, side effects, and the possibility of complications.  The risks and complications are dependent upon the sites that are lesioned, or the type of nerve block to be performed.  The closer the procedure is to the spine, the more serious the risks are.  Great care is taken when placing the radio frequency needles, block needles or lesioning probes, but sometimes complications can occur. 1. Infection: Any time there is an injection through the skin, there is a risk of infection.  This is why sterile conditions are used for these blocks.  There are four possible types of infection. 1. Localized skin infection. 2. Central Nervous System Infection-This can be in the form of Meningitis, which can be deadly. 3. Epidural Infections-This can be in the form of an epidural abscess, which can cause pressure inside of the spine, causing compression of the spinal cord with subsequent paralysis. This would require an emergency surgery to decompress, and there are no guarantees that the patient would recover from the paralysis. 4. Discitis-This is an infection of the intervertebral discs.  It occurs in about 1% of discography procedures.  It is difficult to treat and it may lead to surgery.        2. Pain: the needles have to go through skin and soft tissues, will cause soreness.       3. Damage to internal structures:  The nerves to be lesioned may be near blood vessels or      other nerves which can be potentially damaged.       4. Bleeding: Bleeding is more common if the patient is taking blood thinners such as  aspirin, Coumadin, Ticiid, Plavix, etc., or if he/she have some genetic predisposition  such as hemophilia.  Bleeding into the spinal canal can cause compression of the spinal  cord with subsequent paralysis.  This would require an emergency surgery to  decompress and there are no guarantees that the patient would recover from the  paralysis.       5. Pneumothorax:  Puncturing of a lung is a possibility, every time a needle is introduced in  the area of the chest or upper back.  Pneumothorax refers to free air around the  collapsed lung(s), inside of the thoracic cavity (chest cavity).  Another two possible  complications related to a similar event would include: Hemothorax and Chylothorax.   These are variations of the Pneumothorax, where instead of air around the collapsed  lung(s), you may have blood or chyle, respectively.       6. Spinal headaches: They may occur with any procedures in the area of the spine.       7. Persistent CSF (Cerebro-Spinal Fluid) leakage: This is a rare problem, but may occur  with prolonged intrathecal or epidural catheters either due to the formation of a fistulous  track or a dural tear.       8. Nerve damage: By working so close to the spinal cord, there is always a possibility of  nerve damage, which could be as serious as a permanent spinal cord injury with  paralysis.       9. Death:  Although rare, severe deadly allergic reactions known as "Anaphylactic  reaction" can occur to any of the medications used.      10. Worsening of the symptoms:  We can always make thing worse.  What are the chances of something like this happening? Chances of any of this occuring are extremely low.  By statistics, you have more of a chance of getting killed in a motor vehicle accident: while driving to the hospital than any of the above occurring .  Nevertheless, you should be aware that they are possibilities.  In general, it is similar to taking a shower.  Everybody knows that you can slip, hit your head and get killed.  Does that mean that you should not shower again?  Nevertheless always keep  in mind that statistics do not mean anything if you happen to be on the wrong side of them.  Even if a procedure has a 1 (one) in a 1,000,000 (million) chance of going wrong, it you happen to be that one..Also, keep in mind that by statistics, you have more of a chance of having something go wrong when taking medications.  Who should not have this procedure? If you are on a blood thinning medication (e.g. Coumadin, Plavix, see list of "Blood Thinners"), or if you have an active infection going on, you should not have the procedure.  If you are taking any blood thinners, please inform your physician.  How should I prepare for this procedure?  Do not eat or drink anything at least six hours prior to the procedure.  Bring a driver with you .  It cannot be a taxi.  Come accompanied by an adult that can drive you back, and that is strong enough to help you if your legs get weak or numb from the local anesthetic.  Take   all of your medicines the morning of the procedure with just enough water to swallow them.  If you have diabetes, make sure that you are scheduled to have your procedure done first thing in the morning, whenever possible.  If you have diabetes, take only half of your insulin dose and notify our nurse that you have done so as soon as you arrive at the clinic.  If you are diabetic, but only take blood sugar pills (oral hypoglycemic), then do not take them on the morning of your procedure.  You may take them after you have had the procedure.  Do not take aspirin or any aspirin-containing medications, at least eleven (11) days prior to the procedure.  They may prolong bleeding.  Wear loose fitting clothing that may be easy to take off and that you would not mind if it got stained with Betadine or blood.  Do not wear any jewelry or perfume  Remove any nail coloring.  It will interfere with some of our monitoring equipment.  NOTE: Remember that this is not meant to be interpreted as a  complete list of all possible complications.  Unforeseen problems may occur.  BLOOD THINNERS The following drugs contain aspirin or other products, which can cause increased bleeding during surgery and should not be taken for 2 weeks prior to and 1 week after surgery.  If you should need take something for relief of minor pain, you may take acetaminophen which is found in Tylenol,m Datril, Anacin-3 and Panadol. It is not blood thinner. The products listed below are.  Do not take any of the products listed below in addition to any listed on your instruction sheet.  A.P.C or A.P.C with Codeine Codeine Phosphate Capsules #3 Ibuprofen Ridaura  ABC compound Congesprin Imuran rimadil  Advil Cope Indocin Robaxisal  Alka-Seltzer Effervescent Pain Reliever and Antacid Coricidin or Coricidin-D  Indomethacin Rufen  Alka-Seltzer plus Cold Medicine Cosprin Ketoprofen S-A-C Tablets  Anacin Analgesic Tablets or Capsules Coumadin Korlgesic Salflex  Anacin Extra Strength Analgesic tablets or capsules CP-2 Tablets Lanoril Salicylate  Anaprox Cuprimine Capsules Levenox Salocol  Anexsia-D Dalteparin Magan Salsalate  Anodynos Darvon compound Magnesium Salicylate Sine-off  Ansaid Dasin Capsules Magsal Sodium Salicylate  Anturane Depen Capsules Marnal Soma  APF Arthritis pain formula Dewitt's Pills Measurin Stanback  Argesic Dia-Gesic Meclofenamic Sulfinpyrazone  Arthritis Bayer Timed Release Aspirin Diclofenac Meclomen Sulindac  Arthritis pain formula Anacin Dicumarol Medipren Supac  Analgesic (Safety coated) Arthralgen Diffunasal Mefanamic Suprofen  Arthritis Strength Bufferin Dihydrocodeine Mepro Compound Suprol  Arthropan liquid Dopirydamole Methcarbomol with Aspirin Synalgos  ASA tablets/Enseals Disalcid Micrainin Tagament  Ascriptin Doan's Midol Talwin  Ascriptin A/D Dolene Mobidin Tanderil  Ascriptin Extra Strength Dolobid Moblgesic Ticlid  Ascriptin with Codeine Doloprin or Doloprin with Codeine  Momentum Tolectin  Asperbuf Duoprin Mono-gesic Trendar  Aspergum Duradyne Motrin or Motrin IB Triminicin  Aspirin plain, buffered or enteric coated Durasal Myochrisine Trigesic  Aspirin Suppositories Easprin Nalfon Trillsate  Aspirin with Codeine Ecotrin Regular or Extra Strength Naprosyn Uracel  Atromid-S Efficin Naproxen Ursinus  Auranofin Capsules Elmiron Neocylate Vanquish  Axotal Emagrin Norgesic Verin  Azathioprine Empirin or Empirin with Codeine Normiflo Vitamin E  Azolid Emprazil Nuprin Voltaren  Bayer Aspirin plain, buffered or children's or timed BC Tablets or powders Encaprin Orgaran Warfarin Sodium  Buff-a-Comp Enoxaparin Orudis Zorpin  Buff-a-Comp with Codeine Equegesic Os-Cal-Gesic   Buffaprin Excedrin plain, buffered or Extra Strength Oxalid   Bufferin Arthritis Strength Feldene Oxphenbutazone   Bufferin plain or Extra Strength Feldene Capsules   Oxycodone with Aspirin   Bufferin with Codeine Fenoprofen Fenoprofen Pabalate or Pabalate-SF   Buffets II Flogesic Panagesic   Buffinol plain or Extra Strength Florinal or Florinal with Codeine Panwarfarin   Buf-Tabs Flurbiprofen Penicillamine   Butalbital Compound Four-way cold tablets Penicillin   Butazolidin Fragmin Pepto-Bismol   Carbenicillin Geminisyn Percodan   Carna Arthritis Reliever Geopen Persantine   Carprofen Gold's salt Persistin   Chloramphenicol Goody's Phenylbutazone   Chloromycetin Haltrain Piroxlcam   Clmetidine heparin Plaquenil   Cllnoril Hyco-pap Ponstel   Clofibrate Hydroxy chloroquine Propoxyphen         Before stopping any of these medications, be sure to consult the physician who ordered them.  Some, such as Coumadin (Warfarin) are ordered to prevent or treat serious conditions such as "deep thrombosis", "pumonary embolisms", and other heart problems.  The amount of time that you may need off of the medication may also vary with the medication and the reason for which you were taking it.  If you are  taking any of these medications, please make sure you notify your pain physician before you undergo any procedures.          Facet Joint Block The facet joints connect the bones of the spine (vertebrae). They make it possible for you to bend, twist, and make other movements with your spine. They also keep you from bending too far, twisting too far, and making other excessive movements. A facet joint block is a procedure where a numbing medicine (anesthetic) is injected into a facet joint. Often, a type of anti-inflammatory medicine called a steroid is also injected. A facet joint block may be done to diagnose neck or back pain. If the pain gets better after a facet joint block, it means the pain is probably coming from the facet joint. If the pain does not get better, it means the pain is probably not coming from the facet joint. A facet joint block may also be done to relieve neck or back pain caused by an inflamed facet joint. A facet joint block is only done to relieve pain if the pain does not improve with other methods, such as medicine, exercise programs, and physical therapy. Tell a health care provider about:  Any allergies you have.  All medicines you are taking, including vitamins, herbs, eye drops, creams, and over-the-counter medicines.  Any problems you or family members have had with anesthetic medicines.  Any blood disorders you have.  Any surgeries you have had.  Any medical conditions you have.  Whether you are pregnant or may be pregnant. What are the risks? Generally, this is a safe procedure. However, problems may occur, including:  Bleeding.  Injury to a nerve near the injection site.  Pain at the injection site.  Weakness or numbness in areas controlled by nerves near the injection site.  Infection.  Temporary fluid retention.  Allergic reactions to medicines or dyes.  Injury to other structures or organs near the injection site. What happens before  the procedure?  Follow instructions from your health care provider about eating or drinking restrictions.  Ask your health care provider about:  Changing or stopping your regular medicines. This is especially important if you are taking diabetes medicines or blood thinners.  Taking medicines such as aspirin and ibuprofen. These medicines can thin your blood. Do not take these medicines before your procedure if your health care provider instructs you not to.  Do not take any new dietary supplements or medicines without asking your   health care provider first.  Plan to have someone take you home after the procedure. What happens during the procedure?  You may need to remove your clothing and dress in an open-back gown.  The procedure will be done while you are lying on an X-ray table. You will most likely be asked to lie on your stomach, but you may be asked to lie in a different position if an injection will be made in your neck.  Machines will be used to monitor your oxygen levels, heart rate, and blood pressure.  If an injection will be made in your neck, an IV tube will be inserted into one of your veins. Fluids and medicine will flow directly into your body through the IV tube.  The area over the facet joint where the injection will be made will be cleaned with soap. The surrounding skin will be covered with clean drapes.  A numbing medicine (local anesthetic) will be applied to your skin. Your skin may sting or burn for a moment.  A video X-ray machine (fluoroscopy) will be used to locate the joint. In some cases, a CT scan may be used.  A contrast dye may be injected into the facet joint area to help locate the joint.  When the joint is located, an anesthetic will be injected into the joint through the needle.  Your health care provider will ask you whether you feel pain relief. If you do feel relief, a steroid may be injected to provide pain relief for a longer period of time.  If you do not feel relief or feel only partial relief, additional injections of an anesthetic may be made in other facet joints.  The needle will be removed.  Your skin will be cleaned.  A bandage (dressing) will be applied over each injection site. The procedure may vary among health care providers and hospitals. What happens after the procedure?  You will be observed for 15-30 minutes before being allowed to go home. This information is not intended to replace advice given to you by your health care provider. Make sure you discuss any questions you have with your health care provider. Document Released: 03/01/2007 Document Revised: 11/11/2015 Document Reviewed: 07/06/2015 Elsevier Interactive Patient Education  2017 [employer]  

## 2017-01-31 NOTE — Progress Notes (Signed)
Patient's Name: Suzanne Avila  MRN: [id number]  Referring Provider: Mar Daring, Mamie Nick*  DOB: 11-08-1945  PCP: Mar Daring, PA-C  DOS: 01/31/2017  Note by: Kathlen Brunswick. Dossie Arbour, MD  Service setting: Ambulatory outpatient  Specialty: Interventional Pain Management  Location: [facility] (AMB) Pain Management Facility    Patient type: Established   Primary Reason(s) for Visit: Encounter for prescription drug management (Level of risk: moderate) CC: Back Pain (left, lower)  HPI  Suzanne Avila is a 71 y.o. year old, female patient, who comes today for a medication management evaluation. 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[place]); B12 deficiency; CAFL (chronic airflow limitation) ([place]); Narrowing of intervertebral disc space; Encounter for screening for diabetes mellitus; Acid reflux; Glaucoma; History of metabolic disorder; Benign essential HTN; Fungal infection of toenail; Osteopenia; Hypercholesterolemia without hypertriglyceridemia; Compulsive tobacco user syndrome; Absence of bladder continence; Osteoarthritis of hip (Left); Chronic hip pain (Left); Chronic lumbar radicular pain (L5 dermatome) (Location of Primary Source of Pain) (Bilateral) (L>R); Lumbar facet syndrome (Location of Primary Source of Pain) (Bilateral) (L>R); Lumbar spondylosis; 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 Her primarily concern today is the Back Pain (left, lower)  Pain Assessment: Self-Reported Pain Score: 4 /10 Clinically the patient looks like a 2/10 Reported level is inconsistent with clinical observations. Information on the proper use of the pain scale provided to the patient today Pain Type: Chronic pain Pain Location: Back Pain Orientation: Left, Lower Pain Descriptors / Indicators: Burning, Aching (shock) Pain Frequency: Intermittent  Suzanne Avila was last scheduled for an appointment on 10/27/2016 for medication management. During today's appointment we reviewed Suzanne Avila's chronic pain status, as well as her outpatient medication regimen. The patient returns to the clinics today indicating that she continues to have pain in the lower back, on both sides with the left side being worse than the right. She also has been experiencing some discomfort, especially when she flexes at the level of her waist to tie her shoes. This discomfort seems to follow a thoracic dermatomal distribution. Today I have attempted to review some of the x-rays of thoracic spine, but there were none available. The patient is 71 years old and she is within that range that she could be having some thoracic compression fractures. She also may have more serious problems since she is a chronic smoker with COPD.  The patient  reports that she does not use drugs. Her body mass index is 23.83 kg/m.  Further details on both, my assessment(s), as well as the proposed treatment plan, please see below.  Controlled Substance Pharmacotherapy Assessment REMS (Risk Evaluation and Mitigation Strategy)  Analgesic:Hydrocodone/APAP 7.5/325 one every 6 hours (30 mg per day) MME/day:30 mg/day.  [name], RN  01/31/2017  8:38 AM  Sign at  close encounter Nursing Pain Medication Assessment:  Safety precautions to be maintained throughout the outpatient stay will  include: orient to surroundings, keep bed in low position, maintain call bell within reach at all times, provide assistance with transfer out of bed and ambulation.  Medication Inspection Compliance: Pill count conducted under aseptic conditions, in front of the patient. Neither the pills nor the bottle was removed from the patient's sight at any time. Once count was completed pills were immediately returned to the patient in their original bottle.  Medication: Hydrocodone/APAP Pill/Patch Count: 16 of 120 pills remain Pill/Patch Appearance: Markings consistent with prescribed medication Bottle Appearance: Standard pharmacy container. Clearly labeled. Filled Date:03/12/ 2018 Last Medication intake:  Today   Pharmacokinetics: Liberation and absorption (onset of action): WNL Distribution (time to peak effect): WNL Metabolism and excretion (duration of action): WNL         Pharmacodynamics: Desired effects: Analgesia: Suzanne Avila reports >50% benefit. Functional ability: Patient reports that medication allows her to accomplish basic ADLs Clinically meaningful improvement in function (CMIF): Sustained CMIF goals met Perceived effectiveness: Described as relatively effective, allowing for increase in activities of daily living (ADL) Undesirable effects: Side-effects or Adverse reactions: None reported Monitoring: [place] PMP: Online review of the past 75-monthperiod conducted. Compliant with practice rules and regulations List of all UDS test(s) done:  Lab Results  Component Value Date   TOXASSSELUR FINAL 07/25/2016   TOXASSSELUR FINAL 04/20/2016   TOXASSSELUR FINAL 11/11/2015   Last UDS on record: ToxAssure Select 13  Date Value Ref Range Status  07/25/2016 FINAL  Final    Comment:    ==================================================================== TOXASSURE  SELECT 13 (MW) ==================================================================== Test                             Result       Flag       Units Drug Present and Declared for Prescription Verification   Hydrocodone                    3261         EXPECTED   ng/mg creat   Hydromorphone                  1368         EXPECTED   ng/mg creat   Dihydrocodeine                 205          EXPECTED   ng/mg creat   Norhydrocodone                 1864         EXPECTED   ng/mg creat    Sources of hydrocodone include scheduled prescription    medications. Hydromorphone, dihydrocodeine and norhydrocodone are    expected metabolites of hydrocodone. Hydromorphone and    dihydrocodeine are also available as scheduled prescription    medications. ==================================================================== Test                      Result    Flag   Units      Ref Range   Creatinine              44               mg/dL      >=20 ==================================================================== Declared Medications:  The flagging and interpretation on this report are based on the  following declared medications.  Unexpected results may arise from  inaccuracies in  the declared medications.  **Note: The testing scope of this panel includes these medications:  Hydrocodone (Norco)  **Note: The testing scope of this panel does not include following  reported medications:  Acetaminophen (Norco)  Albuterol  Aspirin  Atorvastatin (Lipitor)  Cyanocobalamin  Diltiazem  Dorzolamide  Fluticasone (Advair)  Latanoprost (Xalatan)  Meloxicam (Mobic)  Potassium (Klor-Con)  Pregabalin (Lyrica)  Ramipril (Altace)  Salmeterol (Advair)  Spironolactone (Aldactone)  Vitamin D ==================================================================== For clinical consultation, please call [phone]. ====================================================================    UDS interpretation: Compliant           Medication Assessment Form: Reviewed. Patient indicates being compliant with therapy Treatment compliance: Compliant Risk Assessment Profile: Aberrant behavior: See prior evaluations. None observed or detected today Comorbid factors increasing risk of overdose: See prior notes. No additional risks detected today Risk of substance use disorder (SUD): Low Opioid Risk Tool (ORT) Total Score: 0  Interpretation Table:  Score <3 = Low Risk for SUD  Score between 4-7 = Moderate Risk for SUD  Score >8 = High Risk for Opioid Abuse   Risk Mitigation Strategies:  Patient Counseling: Covered Patient-Prescriber Agreement (PPA): Present and active  Notification to other healthcare providers: Done  Pharmacologic Plan: No change in therapy, at this time  Laboratory Chemistry  Inflammation Markers Lab Results  Component Value Date   CRP <0.5 11/11/2015   ESRSEDRATE 23 11/11/2015   (CRP: Acute Phase) (ESR: Chronic Phase) Renal Function Markers Lab Results  Component Value Date   BUN 9 06/14/2016   CREATININE 0.71 06/14/2016   GFRAA 99 06/14/2016   GFRNONAA 86 06/14/2016   Hepatic Function Markers Lab Results  Component Value Date   AST 24 06/14/2016   ALT 15 06/14/2016   ALBUMIN 4.4 06/14/2016   ALKPHOS 138 (H) 06/14/2016   Electrolytes Lab Results  Component Value Date   NA 139 06/14/2016   K 4.3 06/14/2016   CL 97 06/14/2016   CALCIUM 9.6 06/14/2016   MG 1.8 11/11/2015   Neuropathy Markers Lab Results  Component Value Date   VITAMINB12 1,220 (H) 06/14/2016   Bone Pathology Markers Lab Results  Component Value Date   ALKPHOS 138 (H) 06/14/2016   VD25OH 32.7 06/14/2016   CALCIUM 9.6 06/14/2016   Coagulation Parameters Lab Results  Component Value Date   PLT 267 06/14/2016   Cardiovascular Markers Lab Results  Component Value Date   BNP 78.2 06/14/2016   HGB 16.0 03/22/2014   HCT 43.0 06/14/2016   Note: Lab results reviewed.  Recent Diagnostic  Imaging Review  Ct Lumbar Spine Wo Contrast Result Date: 11/18/2016 CLINICAL DATA:  Pt with increasing lower back pain over the last three years. Pt states that she also has episodes of shooting pain down her left leg and weakness in that leg. Pt states that she had back surgery in 2010. EXAM: CT LUMBAR SPINE WITHOUT CONTRAST TECHNIQUE: Multidetector CT imaging of the lumbar spine was performed without intravenous contrast administration. Multiplanar CT image reconstructions were also generated. COMPARISON:  Lumbar CT, 11/08/2010 FINDINGS: Segmentation: 5 lumbar type vertebrae. Alignment: No spondylolisthesis. Mild dextroscoliosis, apex at L3. Mild subluxation of the L3 vertebra to the right in relation to L4. Vertebrae: No fracture or bone lesion. Lumbar vertebrae are normal in height. Paraspinal and other soft tissues: There are aortic and branch vessel atherosclerotic calcifications. This is most prominent involving the right renal artery with is associated with significant right renal atrophy. Renal atrophy on the right has developed since the prior CT. No soft  tissue masses. No adenopathy. Disc levels: T12-L1: No significant disc bulging. No disc herniation. No stenosis. L1-L2: Mild loss of disc height. Mild spondylotic disc bulging. No disc herniation. No stenosis. L2-L3: Minor spondylotic disc bulging. No disc herniation. No significant stenosis. L3-L4: Marked loss of disc height with endplate sclerosis and diffuse spondylotic disc bulging. Bilateral pedicle screws are well-positioned with no evidence of loosening. No disc herniations. Mild narrowing of the superolateral recesses from disc bulging. Neural foramina are well preserved. L4-L5: Bilateral hemi laminectomies. Bone graft material extends across the disc interspace associated with an intervertebral cage which has mildly subsided into the lower aspect of the L4 vertebra. No significant disc bulging. No convincing disc herniation. Bilateral L4  pedicle screws are well-positioned in well-seated. Central spinal canal and neural foramina appear well preserved. L5-S1: Bone graft material extends across a portion of the disc interspace associated with an intervertebral spacer. Mild subsidence noted into the lower endplate of L5. No significant disc bulging. No disc herniation. Bilateral S1 pedicle screws appear well seated and well-positioned. The central spinal canal, lateral recesses and neural foramina are well preserved. IMPRESSION: 1. No fracture or acute finding. 2. Postsurgical and degenerative changes as detailed above. No disc herniations. No significant central stenosis, lateral recess narrowing or neural foraminal narrowing. 3. Orthopedic hardware appears well seated with no evidence of loosening. 4. Marked right renal artery atherosclerotic calcifications with significant right renal atrophy. Right renal atrophy has developed since the prior lumbar spine CT. 5. Degenerative changes and postsurgical changes of the lumbar spine are similar from L3-L4 through L5-S1 to the prior study. There has been an increase in disc degenerative change at L1-L2 since the prior exam. Electronically Signed   By: Lajean Manes M.D.   On: 11/18/2016 11:01   Note: Results of ordered imaging test(s) reviewed and explained to patient in Layman's terms.          Meds  The patient has a current medication list which includes the following prescription(s): albuterol, aspirin, atorvastatin, cholecalciferol, diltiazem, dorzolamide, fluticasone-salmeterol, hydrocodone-acetaminophen, hydrocodone-acetaminophen, hydrocodone-acetaminophen, klor-con m20, latanoprost, meloxicam, pregabalin, ramipril, spironolactone, and vitamin b-12.  Current Outpatient Prescriptions on File Prior to Visit  Medication Sig  . albuterol (PROVENTIL HFA;VENTOLIN HFA) 108 (90 Base) MCG/ACT inhaler ALBUTEROL, 90MCG/ACT (Inhalation Aerosol Solution)  2 PUFFS Every Day, As Needed for 0 days   Quantity: 0.00;  Refills: 0   Ordered :09-Jan-2014  Celene Kras, MA, [place]ast[place] ;  Started 10-Aug-2009 Active Comments: Medication taken as needed.   Marland Kitchen aspirin 325 MG tablet Take 325 mg by mouth daily.  Marland Kitchen atorvastatin (LIPITOR) 40 MG tablet Take 40 mg by mouth daily.  . cholecalciferol (VITAMIN D) 1000 units tablet Take 1 tablet (1,000 Units total) by mouth daily.  Marland Kitchen diltiazem (CARDIZEM) 120 MG tablet Take 1 tablet (120 mg total) by mouth daily.  . dorzolamide (TRUSOPT) 2 % ophthalmic solution   . Fluticasone-Salmeterol (ADVAIR DISKUS) 250-50 MCG/DOSE AEPB USE 1 INHALATION TWICE A DAY  . KLOR-CON M20 20 MEQ tablet Take 20 mEq by mouth daily.   Marland Kitchen latanoprost (XALATAN) 0.005 % ophthalmic solution 1 drop at bedtime.  . ramipril (ALTACE) 10 MG capsule TAKE 1 CAPSULE DAILY FOR BLOOD PRESSURE  . spironolactone (ALDACTONE) 25 MG tablet Take 1 tablet (25 mg total) by mouth daily.  . vitamin B-12 (CYANOCOBALAMIN) 500 MCG tablet Take 500 mcg by mouth daily.   No current facility-administered medications on file prior to visit.    ROS  Constitutional: Denies any fever or chills  Gastrointestinal: No reported hemesis, hematochezia, vomiting, or acute GI distress Musculoskeletal: Denies any acute onset joint swelling, redness, loss of ROM, or weakness Neurological: No reported episodes of acute onset apraxia, aphasia, dysarthria, agnosia, amnesia, paralysis, loss of coordination, or loss of consciousness  Allergies  Ms. Marcos is allergic to codeine; cyclobenzaprine; and penicillins.  [place]  Drug: Ms. Urbanczyk  reports that she does not use drugs. Alcohol:  reports that she drinks alcohol. Tobacco:  reports that she has been smoking Cigarettes.  She has a 50.00 pack-year smoking history. She has never used smokeless tobacco. Medical:  has a past medical history of Glaucoma and Hypertension. Family: family history includes Cancer in her father and mother.  Past Surgical History:  Procedure  Laterality Date  . BACK SURGERY    . CARPAL TUNNEL RELEASE Left   . EYE SURGERY     Constitutional Exam  General appearance: Well nourished, well developed, and well hydrated. In no apparent acute distress Vitals:   01/31/17 0833  BP: (!) 160/70  Pulse: 78  Resp: 16  Temp: 97.1 F (36.2 C)  TempSrc: Oral  SpO2: 98%  Weight: 118 lb (53.5 kg)  Height: '4\' 11"'$  (1.499 m)   BMI Assessment: Estimated body mass index is 23.83 kg/m as calculated from the following:   Height as of this encounter: '4\' 11"'$  (1.499 m).   Weight as of this encounter: 118 lb (53.5 kg).  BMI interpretation table: BMI level Category Range association with higher incidence of chronic pain  <18 kg/m2 Underweight   18.5-24.9 kg/m2 Ideal body weight   25-29.9 kg/m2 Overweight Increased incidence by 20%  30-34.9 kg/m2 Obese (Class I) Increased incidence by 68%  35-39.9 kg/m2 Severe obesity (Class II) Increased incidence by 136%  >40 kg/m2 Extreme obesity (Class III) Increased incidence by 254%   BMI Readings from Last 4 Encounters:  01/31/17 23.83 kg/m  10/27/16 23.83 kg/m  07/25/16 23.83 kg/m  06/13/16 24.83 kg/m   Wt Readings from Last 4 Encounters:  01/31/17 118 lb (53.5 kg)  10/27/16 118 lb (53.5 kg)  07/25/16 118 lb (53.5 kg)  06/13/16 118 lb 12.8 oz (53.9 kg)  Psych/Mental status: Alert, oriented x 3 (person, place, & time)       Eyes: PERLA Respiratory: No evidence of acute respiratory distress  Cervical Spine Exam  Inspection: No masses, redness, or swelling Alignment: Symmetrical Functional ROM: Unrestricted ROM Stability: No instability detected Muscle strength & Tone: Functionally intact Sensory: Unimpaired Palpation: No palpable anomalies  Upper Extremity (UE) Exam    Side: Right upper extremity  Side: Left upper extremity  Inspection: No masses, redness, swelling, or asymmetry. No contractures  Inspection: No masses, redness, swelling, or asymmetry. No contractures  Functional  ROM: Unrestricted ROM          Functional ROM: Unrestricted ROM          Muscle strength & Tone: Functionally intact  Muscle strength & Tone: Functionally intact  Sensory: Unimpaired  Sensory: Unimpaired  Palpation: No palpable anomalies  Palpation: No palpable anomalies  Specialized Test(s): Deferred         Specialized Test(s): Deferred          Thoracic Spine Exam  Inspection: No masses, redness, or swelling Alignment: Symmetrical Functional ROM: Unrestricted ROM Stability: No instability detected Sensory: Unimpaired Muscle strength & Tone: No palpable anomalies  Lumbar Spine Exam  Inspection: No masses, redness, or swelling Alignment: Symmetrical Functional ROM: Minimal ROM Stability: No instability detected Muscle strength & Tone:  Functionally intact Sensory: Movement-associated pain Palpation: Tender Provocative Tests: Lumbar Hyperextension and rotation test: Positive bilaterally for facet joint pain. Patrick's Maneuver: evaluation deferred today              Gait & Posture Assessment  Ambulation: Unassisted Gait: Antalgic Posture: Antalgic   Lower Extremity Exam    Side: Right lower extremity  Side: Left lower extremity  Inspection: No masses, redness, swelling, or asymmetry. No contractures  Inspection: No masses, redness, swelling, or asymmetry. No contractures  Functional ROM: Unrestricted ROM          Functional ROM: Unrestricted ROM          Muscle strength & Tone: Functionally intact  Muscle strength & Tone: Functionally intact  Sensory: Unimpaired  Sensory: Unimpaired  Palpation: No palpable anomalies  Palpation: No palpable anomalies   Assessment  Primary Diagnosis & Pertinent Problem List: The primary encounter diagnosis was Chronic pain syndrome. Diagnoses of Chronic low back pain (Location of Secondary source of pain) (Bilateral) (L>R), Lumbar facet syndrome (Location of Primary Source of Pain) (Bilateral) (L>R), Thoracic radiculitis, Long term prescription  opiate use, Opiate use (30 MME/Day), Disturbance of skin sensation, Arthropathy of left hip, and Fibromyalgia were also pertinent to this visit.  Status Diagnosis  Controlled Persistent Controlled 1. Chronic pain syndrome   2. Chronic low back pain (Location of Secondary source of pain) (Bilateral) (L>R)   3. Lumbar facet syndrome (Location of Primary Source of Pain) (Bilateral) (L>R)   4. Thoracic radiculitis   5. Long term prescription opiate use   6. Opiate use (30 MME/Day)   7. Disturbance of skin sensation   8. Arthropathy of left hip   9. Fibromyalgia      Plan of Care  Pharmacotherapy (Medications Ordered): Meds ordered this encounter  Medications  . HYDROcodone-acetaminophen (NORCO) 7.5-325 MG tablet    Sig: Take 1 tablet by mouth every 6 (six) hours as needed for severe pain.    Dispense:  120 tablet    Refill:  0    Do not place this medication, or any other prescription from our practice, on "Automatic Refill". Patient may have prescription filled one day early if pharmacy is closed on scheduled refill date. Do not fill until: 03/03/17 To last until: 04/02/17  . HYDROcodone-acetaminophen (NORCO) 7.5-325 MG tablet    Sig: Take 1 tablet by mouth every 6 (six) hours as needed for severe pain.    Dispense:  120 tablet    Refill:  0    Do not place this medication, or any other prescription from our practice, on "Automatic Refill". Patient may have prescription filled one day early if pharmacy is closed on scheduled refill date. Do not fill until: 02/01/17 To last until: 03/03/17  . HYDROcodone-acetaminophen (NORCO) 7.5-325 MG tablet    Sig: Take 1 tablet by mouth every 6 (six) hours as needed for severe pain.    Dispense:  120 tablet    Refill:  0    Do not place this medication, or any other prescription from our practice, on "Automatic Refill". Patient may have prescription filled one day early if pharmacy is closed on scheduled refill date. Do not fill until:  04/02/17 To last until: 05/02/17  . meloxicam (MOBIC) 15 MG tablet    Sig: Take 1 tablet (15 mg total) by mouth daily.    Dispense:  90 tablet    Refill:  0    Patient may have prescription filled one day early if pharmacy is  closed on scheduled refill date. 3 month refill.  . pregabalin (LYRICA) 150 MG capsule    Sig: Take 1 capsule (150 mg total) by mouth every 8 (eight) hours.    Dispense:  270 capsule    Refill:  0    Do not place this medication, or any other prescription from our practice, on "Automatic Refill". Patient may have prescription filled one day early if pharmacy is closed on scheduled refill date. 3 month supply.   New Prescriptions   No medications on file   Medications administered today: Ms. Zachar had no medications administered during this visit. Lab-work, procedure(s), and/or referral(s): Orders Placed This Encounter  Procedures  . LUMBAR FACET(MEDIAL BRANCH NERVE BLOCK) MBNB  . DG Thoracic Spine 2 View  . C-reactive protein  . Magnesium  . Sedimentation rate   Imaging and/or referral(s): DG THORACIC SPINE 2 VIEW  Interventional therapies: Planned, scheduled, and/or pending:   Bilateral lumbar facet block under fluoro and IV sedation   Considering:   Diagnostic bilateral intra-articular hip joint injection  Possible bilateral hip joint radiofrequency ablation. Diagnostic bilateral lumbar facet block  Possible bilateral lumbar facet radiofrequency ablation  Diagnostic bilateral L4-L5 transforaminal epidural steroid injection  Diagnostic caudal epidural steroid injection  Diagnostic caudal epidurogram  Possible right ask epidural lysis of adhesions.  Diagnostic left-sided L2-3 lumbar epidural steroid injection  Possible bilateral Lumbar spinal cord stimulator trial implant.    Palliative PRN treatment(s):   Diagnostic bilateral intra-articular hip joint injection  Diagnostic bilateral lumbar facet block  Diagnostic bilateral L4-L5  transforaminal epidural steroid injection  Diagnostic caudal epidural steroid injection  Diagnostic caudal epidurogram  Diagnostic left-sided L2-3 lumbar epidural steroid injection    Provider-requested follow-up: Return in about 3 months (around 05/02/2017) for (Nurse Practitioner) Med-Mgmt, in addition, procedure (ASAP).  Future Appointments Date Time Provider [place]  03/13/2017 9:00 AM Milinda Pointer, MD [facility] None  05/02/2017 9:00 AM [place], NP [facility] None   Primary Care Physician: Mar Daring, PA-C Location: [facility] Outpatient Pain Management Facility Note by: Kathlen Brunswick. Dossie Arbour, M.D, DABA, DABAPM, DABPM, DABIPP, FIPP Date: 01/31/2017; Time: 6:56 PM  Pain Score Disclaimer: We use the NRS-11 scale. This is a self-reported, subjective measurement of pain severity with only modest accuracy. It is used primarily to identify changes within a particular patient. It must be understood that outpatient pain scales are significantly less accurate that those used for research, where they can be applied under ideal controlled circumstances with minimal exposure to variables. In reality, the score is likely to be a combination of pain intensity and pain affect, where pain affect describes the degree of emotional arousal or changes in action readiness caused by the sensory experience of pain. Factors such as social and work situation, setting, emotional state, anxiety levels, expectation, and prior pain experience may influence pain perception and show large inter-individual differences that may also be affected by time variables.  Patient instructions provided during this appointment: Patient Instructions   Preparing for Procedure with Sedation Instructions: . Oral Intake: Do not eat or drink anything for at least 8 hours prior to your procedure. . Transportation: Public transportation is not allowed. Bring an adult driver. The driver must be physically present in  our waiting room before any procedure can be started. Marland Kitchen Physical Assistance: Bring an adult physically capable of assisting you, in the event you need help. This adult should keep you company at home for at least 6 hours after the procedure. . Blood  Pressure Medicine: Take your blood pressure medicine with a sip of water the morning of the procedure. . Blood thinners:  . Diabetics on insulin: Notify the staff so that you can be scheduled 1st case in the morning. If your diabetes requires high dose insulin, take only  of your normal insulin dose the morning of the procedure and notify the staff that you have done so. . Preventing infections: Shower with an antibacterial soap the morning of your procedure. . Build-up your immune system: Take 1000 mg of Vitamin C with every meal (3 times a day) the day prior to your procedure. Marland Kitchen Antibiotics: Inform the staff if you have a condition or reason that requires you to take antibiotics before dental procedures. . Pregnancy: If you are pregnant, call and cancel the procedure. . Sickness: If you have a cold, fever, or any active infections, call and cancel the procedure. . Arrival: You must be in the facility at least 30 minutes prior to your scheduled procedure. . Children: Do not bring children with you. . Dress appropriately: Bring dark clothing that you would not mind if they get stained. . Valuables: Do not bring any jewelry or valuables. Procedure appointments are reserved for interventional treatments only. Marland Kitchen No Prescription Refills. . No medication changes will be discussed during procedure appointments. . No disability issues will be discussed.  ____________________________________________________________________________________________  GENERAL RISKS AND COMPLICATIONS  What are the risk, side effects and possible complications? Generally speaking, most procedures are safe.  However, with any procedure there are risks, side effects, and the  possibility of complications.  The risks and complications are dependent upon the sites that are lesioned, or the type of nerve block to be performed.  The closer the procedure is to the spine, the more serious the risks are.  Great care is taken when placing the radio frequency needles, block needles or lesioning probes, but sometimes complications can occur. 1. Infection: Any time there is an injection through the skin, there is a risk of infection.  This is why sterile conditions are used for these blocks.  There are four possible types of infection. 1. Localized skin infection. 2. Central Nervous System Infection-This can be in the form of Meningitis, which can be deadly. 3. Epidural Infections-This can be in the form of an epidural abscess, which can cause pressure inside of the spine, causing compression of the spinal cord with subsequent paralysis. This would require an emergency surgery to decompress, and there are no guarantees that the patient would recover from the paralysis. 4. Discitis-This is an infection of the intervertebral discs.  It occurs in about 1% of discography procedures.  It is difficult to treat and it may lead to surgery.        2. Pain: the needles have to go through skin and soft tissues, will cause soreness.       3. Damage to internal structures:  The nerves to be lesioned may be near blood vessels or    other nerves which can be potentially damaged.       4. Bleeding: Bleeding is more common if the patient is taking blood thinners such as  aspirin, Coumadin, Ticiid, Plavix, etc., or if he/she have some genetic predisposition  such as hemophilia. Bleeding into the spinal canal can cause compression of the spinal  cord with subsequent paralysis.  This would require an emergency surgery to  decompress and there are no guarantees that the patient would recover from the  paralysis.  5. Pneumothorax:  Puncturing of a lung is a possibility, every time a needle is introduced  in  the area of the chest or upper back.  Pneumothorax refers to free air around the  collapsed lung(s), inside of the thoracic cavity (chest cavity).  Another two possible  complications related to a similar event would include: Hemothorax and Chylothorax.   These are variations of the Pneumothorax, where instead of air around the collapsed  lung(s), you may have blood or chyle, respectively.       6. Spinal headaches: They may occur with any procedures in the area of the spine.       7. Persistent CSF (Cerebro-Spinal Fluid) leakage: This is a rare problem, but may occur  with prolonged intrathecal or epidural catheters either due to the formation of a fistulous  track or a dural tear.       8. Nerve damage: By working so close to the spinal cord, there is always a possibility of  nerve damage, which could be as serious as a permanent spinal cord injury with  paralysis.       9. Death:  Although rare, severe deadly allergic reactions known as "Anaphylactic  reaction" can occur to any of the medications used.      10. Worsening of the symptoms:  We can always make thing worse.  What are the chances of something like this happening? Chances of any of this occuring are extremely low.  By statistics, you have more of a chance of getting killed in a motor vehicle accident: while driving to the hospital than any of the above occurring .  Nevertheless, you should be aware that they are possibilities.  In general, it is similar to taking a shower.  Everybody knows that you can slip, hit your head and get killed.  Does that mean that you should not shower again?  Nevertheless always keep in mind that statistics do not mean anything if you happen to be on the wrong side of them.  Even if a procedure has a 1 (one) in a 1,000,000 (million) chance of going wrong, it you happen to be that one..Also, keep in mind that by statistics, you have more of a chance of having something go wrong when taking medications.  Who  should not have this procedure? If you are on a blood thinning medication (e.g. Coumadin, Plavix, see list of "Blood Thinners"), or if you have an active infection going on, you should not have the procedure.  If you are taking any blood thinners, please inform your physician.  How should I prepare for this procedure?  Do not eat or drink anything at least six hours prior to the procedure.  Bring a driver with you .  It cannot be a taxi.  Come accompanied by an adult that can drive you back, and that is strong enough to help you if your legs get weak or numb from the local anesthetic.  Take all of your medicines the morning of the procedure with just enough water to swallow them.  If you have diabetes, make sure that you are scheduled to have your procedure done first thing in the morning, whenever possible.  If you have diabetes, take only half of your insulin dose and notify our nurse that you have done so as soon as you arrive at the clinic.  If you are diabetic, but only take blood sugar pills (oral hypoglycemic), then do not take them on the morning of your  procedure.  You may take them after you have had the procedure.  Do not take aspirin or any aspirin-containing medications, at least eleven (11) days prior to the procedure.  They may prolong bleeding.  Wear loose fitting clothing that may be easy to take off and that you would not mind if it got stained with Betadine or blood.  Do not wear any jewelry or perfume  Remove any nail coloring.  It will interfere with some of our monitoring equipment.  NOTE: Remember that this is not meant to be interpreted as a complete list of all possible complications.  Unforeseen problems may occur.  BLOOD THINNERS The following drugs contain aspirin or other products, which can cause increased bleeding during surgery and should not be taken for 2 weeks prior to and 1 week after surgery.  If you should need take something for relief of minor  pain, you may take acetaminophen which is found in Tylenol,m Datril, Anacin-3 and Panadol. It is not blood thinner. The products listed below are.  Do not take any of the products listed below in addition to any listed on your instruction sheet.  A.P.C or A.P.C with Codeine Codeine Phosphate Capsules #3 Ibuprofen Ridaura  ABC compound Congesprin Imuran rimadil  Advil Cope Indocin Robaxisal  Alka-Seltzer Effervescent Pain Reliever and Antacid Coricidin or Coricidin-D  Indomethacin Rufen  Alka-Seltzer plus Cold Medicine Cosprin Ketoprofen S-A-C Tablets  Anacin Analgesic Tablets or Capsules Coumadin Korlgesic Salflex  Anacin Extra Strength Analgesic tablets or capsules CP-2 Tablets Lanoril Salicylate  Anaprox Cuprimine Capsules Levenox Salocol  Anexsia-D Dalteparin Magan Salsalate  Anodynos Darvon compound Magnesium Salicylate Sine-off  Ansaid Dasin Capsules Magsal Sodium Salicylate  Anturane Depen Capsules Marnal Soma  APF Arthritis pain formula Dewitt's Pills Measurin Stanback  Argesic Dia-Gesic Meclofenamic Sulfinpyrazone  Arthritis Bayer Timed Release Aspirin Diclofenac Meclomen Sulindac  Arthritis pain formula Anacin Dicumarol Medipren Supac  Analgesic (Safety coated) Arthralgen Diffunasal Mefanamic Suprofen  Arthritis Strength Bufferin Dihydrocodeine Mepro Compound Suprol  Arthropan liquid Dopirydamole Methcarbomol with Aspirin Synalgos  ASA tablets/Enseals Disalcid Micrainin Tagament  Ascriptin Doan's Midol Talwin  Ascriptin A/D Dolene Mobidin Tanderil  Ascriptin Extra Strength Dolobid Moblgesic Ticlid  Ascriptin with Codeine Doloprin or Doloprin with Codeine Momentum Tolectin  Asperbuf Duoprin Mono-gesic Trendar  Aspergum Duradyne Motrin or Motrin IB Triminicin  Aspirin plain, buffered or enteric coated Durasal Myochrisine Trigesic  Aspirin Suppositories Easprin Nalfon Trillsate  Aspirin with Codeine Ecotrin Regular or Extra Strength Naprosyn Uracel  Atromid-S Efficin Naproxen  Ursinus  Auranofin Capsules Elmiron Neocylate Vanquish  Axotal Emagrin Norgesic Verin  Azathioprine Empirin or Empirin with Codeine Normiflo Vitamin E  Azolid Emprazil Nuprin Voltaren  Bayer Aspirin plain, buffered or children's or timed BC Tablets or powders Encaprin Orgaran Warfarin Sodium  Buff-a-Comp Enoxaparin Orudis Zorpin  Buff-a-Comp with Codeine Equegesic Os-Cal-Gesic   Buffaprin Excedrin plain, buffered or Extra Strength Oxalid   Bufferin Arthritis Strength Feldene Oxphenbutazone   Bufferin plain or Extra Strength Feldene Capsules Oxycodone with Aspirin   Bufferin with Codeine Fenoprofen Fenoprofen Pabalate or Pabalate-SF   Buffets II Flogesic Panagesic   Buffinol plain or Extra Strength Florinal or Florinal with Codeine Panwarfarin   Buf-Tabs Flurbiprofen Penicillamine   Butalbital Compound Four-way cold tablets Penicillin   Butazolidin Fragmin Pepto-Bismol   Carbenicillin Geminisyn Percodan   Carna Arthritis Reliever Geopen Persantine   Carprofen Gold's salt Persistin   Chloramphenicol Goody's Phenylbutazone   Chloromycetin Haltrain Piroxlcam   Clmetidine heparin Plaquenil   Cllnoril Hyco-pap Ponstel   Clofibrate  Hydroxy chloroquine Propoxyphen         Before stopping any of these medications, be sure to consult the physician who ordered them.  Some, such as Coumadin (Warfarin) are ordered to prevent or treat serious conditions such as "deep thrombosis", "pumonary embolisms", and other heart problems.  The amount of time that you may need off of the medication may also vary with the medication and the reason for which you were taking it.  If you are taking any of these medications, please make sure you notify your pain physician before you undergo any procedures.          Facet Joint Block The facet joints connect the bones of the spine (vertebrae). They make it possible for you to bend, twist, and make other movements with your spine. They also keep you from bending  too far, twisting too far, and making other excessive movements. A facet joint block is a procedure where a numbing medicine (anesthetic) is injected into a facet joint. Often, a type of anti-inflammatory medicine called a steroid is also injected. A facet joint block may be done to diagnose neck or back pain. If the pain gets better after a facet joint block, it means the pain is probably coming from the facet joint. If the pain does not get better, it means the pain is probably not coming from the facet joint. A facet joint block may also be done to relieve neck or back pain caused by an inflamed facet joint. A facet joint block is only done to relieve pain if the pain does not improve with other methods, such as medicine, exercise programs, and physical therapy. Tell a health care provider about:  Any allergies you have.  All medicines you are taking, including vitamins, herbs, eye drops, creams, and over-the-counter medicines.  Any problems you or family members have had with anesthetic medicines.  Any blood disorders you have.  Any surgeries you have had.  Any medical conditions you have.  Whether you are pregnant or may be pregnant. What are the risks? Generally, this is a safe procedure. However, problems may occur, including:  Bleeding.  Injury to a nerve near the injection site.  Pain at the injection site.  Weakness or numbness in areas controlled by nerves near the injection site.  Infection.  Temporary fluid retention.  Allergic reactions to medicines or dyes.  Injury to other structures or organs near the injection site. What happens before the procedure?  Follow instructions from your health care provider about eating or drinking restrictions.  Ask your health care provider about:  Changing or stopping your regular medicines. This is especially important if you are taking diabetes medicines or blood thinners.  Taking medicines such as aspirin and ibuprofen.  These medicines can thin your blood. Do not take these medicines before your procedure if your health care provider instructs you not to.  Do not take any new dietary supplements or medicines without asking your health care provider first.  Plan to have someone take you home after the procedure. What happens during the procedure?  You may need to remove your clothing and dress in an open-back gown.  The procedure will be done while you are lying on an X-ray table. You will most likely be asked to lie on your stomach, but you may be asked to lie in a different position if an injection will be made in your neck.  Machines will be used to monitor your oxygen levels, heart rate,  and blood pressure.  If an injection will be made in your neck, an IV tube will be inserted into one of your veins. Fluids and medicine will flow directly into your body through the IV tube.  The area over the facet joint where the injection will be made will be cleaned with soap. The surrounding skin will be covered with clean drapes.  A numbing medicine (local anesthetic) will be applied to your skin. Your skin may sting or burn for a moment.  A video X-ray machine (fluoroscopy) will be used to locate the joint. In some cases, a CT scan may be used.  A contrast dye may be injected into the facet joint area to help locate the joint.  When the joint is located, an anesthetic will be injected into the joint through the needle.  Your health care provider will ask you whether you feel pain relief. If you do feel relief, a steroid may be injected to provide pain relief for a longer period of time. If you do not feel relief or feel only partial relief, additional injections of an anesthetic may be made in other facet joints.  The needle will be removed.  Your skin will be cleaned.  A bandage (dressing) will be applied over each injection site. The procedure may vary among health care providers and hospitals. What  happens after the procedure?  You will be observed for 15-30 minutes before being allowed to go home. This information is not intended to replace advice given to you by your health care provider. Make sure you discuss any questions you have with your health care provider. Document Released: 03/01/2007 Document Revised: 11/11/2015 Document Reviewed: 07/06/2015 Elsevier Interactive Patient Education  2017 [employer].

## 2017-02-08 NOTE — Progress Notes (Signed)
Results were reviewed and found to be: abnormal, but not significant  Initial impression would suggest no surgically correctable pathology  Review would suggest the patient to be a possible candidate for interventional pain management options

## 2017-02-14 ENCOUNTER — Ambulatory Visit
Admission: RE | Admit: 2017-02-14 | Discharge: 2017-02-14 | Disposition: A | Discharge status: Home / Self Care | Payer: Medicare Other | Source: Ambulatory Visit | Attending: Pain Medicine | Admitting: Pain Medicine

## 2017-02-14 ENCOUNTER — Other Ambulatory Visit
Admission: RE | Admit: 2017-02-14 | Discharge: 2017-02-14 | Disposition: A | Discharge status: Home / Self Care | Payer: Medicare Other | Source: Ambulatory Visit | Attending: Pain Medicine | Admitting: Pain Medicine

## 2017-02-14 DIAGNOSIS — M79604 Pain in right leg: Secondary | ICD-10-CM | POA: Diagnosis present

## 2017-02-14 DIAGNOSIS — M961 Postlaminectomy syndrome, not elsewhere classified: Secondary | ICD-10-CM

## 2017-02-14 DIAGNOSIS — M8588 Other specified disorders of bone density and structure, other site: Secondary | ICD-10-CM | POA: Insufficient documentation

## 2017-02-14 DIAGNOSIS — Z981 Arthrodesis status: Secondary | ICD-10-CM | POA: Diagnosis not present

## 2017-02-14 DIAGNOSIS — M4184 Other forms of scoliosis, thoracic region: Secondary | ICD-10-CM | POA: Diagnosis not present

## 2017-02-14 DIAGNOSIS — M5414 Radiculopathy, thoracic region: Secondary | ICD-10-CM | POA: Diagnosis present

## 2017-02-14 DIAGNOSIS — I700 Atherosclerosis of aorta: Secondary | ICD-10-CM | POA: Insufficient documentation

## 2017-02-14 DIAGNOSIS — G8929 Other chronic pain: Secondary | ICD-10-CM

## 2017-02-14 DIAGNOSIS — G894 Chronic pain syndrome: Secondary | ICD-10-CM

## 2017-02-14 DIAGNOSIS — M4186 Other forms of scoliosis, lumbar region: Secondary | ICD-10-CM | POA: Insufficient documentation

## 2017-02-14 DIAGNOSIS — M47816 Spondylosis without myelopathy or radiculopathy, lumbar region: Secondary | ICD-10-CM | POA: Diagnosis not present

## 2017-02-14 DIAGNOSIS — M79605 Pain in left leg: Principal | ICD-10-CM

## 2017-02-14 DIAGNOSIS — M546 Pain in thoracic spine: Secondary | ICD-10-CM | POA: Diagnosis not present

## 2017-02-14 LAB — SEDIMENTATION RATE: Sed Rate: 20 mm/hr (ref 0–30)

## 2017-02-14 LAB — C-REACTIVE PROTEIN: CRP: <0.8 mg/dL (ref <1.0)

## 2017-02-14 LAB — MAGNESIUM: Magnesium: 1.7 mg/dL (ref 1.7–2.4)

## 2017-03-13 ENCOUNTER — Ambulatory Visit: Payer: Medicare Other | Admitting: Pain Medicine

## 2017-05-01 NOTE — Progress Notes (Signed)
Patient's Name: Suzanne Avila  MRN: [id number]  Referring Provider: Florian Buff*  DOB: 01/30/1946  PCP: Mar Daring, PA-C  DOS: 05/02/2017  Note by: Vevelyn Francois NP  Service setting: Ambulatory outpatient  Specialty: Interventional Pain Management  Location: [facility] (AMB) Pain Management Facility    Patient type: Established    Primary Reason(s) for Visit: Encounter for prescription drug management. (Level of risk: moderate)  CC: Back Pain (low) and Pain (left buttock)  HPI  Suzanne Avila is a 71 y.o. year old, female patient, who comes today for a medication management evaluation. 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[place]); B12 deficiency; CAFL (chronic airflow limitation) ([place]); Narrowing of intervertebral disc space; Encounter for screening for diabetes mellitus; Acid reflux; Glaucoma; History of metabolic disorder; Benign essential HTN; Fungal infection of toenail; Osteopenia; Hypercholesterolemia without hypertriglyceridemia; Compulsive tobacco user syndrome; Absence of bladder continence; Osteoarthritis of hip (Left); Chronic hip pain (Left); Chronic lumbar radicular pain (L5 dermatome) (Location of Primary Source of Pain) (Bilateral) (L>R); Lumbar facet syndrome (Location of Primary Source of Pain) (Bilateral) (L>R); Lumbar spondylosis; 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 Her primarily concern today is the Back Pain (low) and Pain (left buttock)  Pain Assessment: Location: Lower Back Radiating: left buttock Onset: More than a month ago Duration: Chronic pain Quality: Burning Severity: 7 /10 (self-reported pain score)  Note: Reported level is compatible with observation.                   Effect on ADL:   Timing: Constant Modifying factors: medication, rest  Suzanne Avila was last scheduled for an appointment on 01/31/17 for medication management. During today's appointment we reviewed Suzanne Avila's chronic pain status, as well as her outpatient medication regimen. She has chronic low back pain. She denies radicular symptoms. She denies any numbness and tingling. She does have occasional weakness in her legs. She was to have a interventional procedure however had to cancel secondary to her husband being ill. She admits that her pain is stable at this time.  The patient  reports that she does not use drugs. Her body mass index is 23.83 kg/m.  Further details on both, my assessment(s), as well as the proposed treatment plan, please see below.  Controlled Substance Pharmacotherapy Assessment REMS (Risk Evaluation and Mitigation Strategy)  Analgesic:Hydrocodone/APAP 7.5/325 one every 6 hours (30 mg per day) MME/day:30 mg/day.   Hart Rochester, RN  05/02/2017  9:22 AM  Sign at close encounter Nursing Pain Medication Assessment:  Safety precautions to be maintained throughout the outpatient stay will include: orient to surroundings, keep bed in low position, maintain call bell within reach at all times, provide assistance with transfer out of bed and ambulation.  Medication Inspection Compliance: Pill count conducted under aseptic conditions, in front of the  patient. Neither the pills nor the bottle was  removed from the patient's sight at any time. Once count was completed pills were immediately returned to the patient in their original bottle.  Medication: Hydrocodone/APAP Pill/Patch Count: 8 of 120 pills remain Pill/Patch Appearance: Markings consistent with prescribed medication Bottle Appearance: Standard pharmacy container. Clearly labeled. Filled Date: 06 / 10 / 2018 Last Medication intake:  Yesterday   Pharmacokinetics: Liberation and absorption (onset of action): WNL Distribution (time to peak effect): WNL Metabolism and excretion (duration of action): WNL         Pharmacodynamics: Desired effects: Analgesia: Suzanne Avila reports >50% benefit. Functional ability: Patient reports that medication allows her to accomplish basic ADLs Clinically meaningful improvement in function (CMIF): Sustained CMIF goals met Perceived effectiveness: Described as relatively effective, allowing for increase in activities of daily living (ADL) Undesirable effects: Side-effects or Adverse reactions: None reported Monitoring: [place] PMP: Online review of the past 55-month period conducted. Compliant with practice rules and regulations List of all UDS test(s) done:  Lab Results  Component Value Date   TOXASSSELUR FINAL 07/25/2016   TOXASSSELUR FINAL 04/20/2016   TOXASSSELUR FINAL 11/11/2015   Last UDS on record: ToxAssure Select 13  Date Value Ref Range Status  07/25/2016 FINAL  Final    Comment:    ==================================================================== TOXASSURE SELECT 13 (MW) ==================================================================== Test                             Result       Flag       Units Drug Present and Declared for Prescription Verification   Hydrocodone                    3261         EXPECTED   ng/mg creat   Hydromorphone                  1368         EXPECTED   ng/mg creat   Dihydrocodeine                 205           EXPECTED   ng/mg creat   Norhydrocodone                 1864         EXPECTED   ng/mg creat    Sources of hydrocodone include scheduled prescription    medications. Hydromorphone, dihydrocodeine and norhydrocodone are    expected metabolites of hydrocodone. Hydromorphone and    dihydrocodeine are also available as scheduled prescription    medications. ==================================================================== Test                      Result    Flag   Units      Ref Range   Creatinine              44               mg/dL      >=54 ==================================================================== Declared Medications:  The flagging and interpretation on this report are based on the  following declared medications.  Unexpected results may arise from  inaccuracies in the declared medications.  **Note: The testing scope of this panel includes these medications:  Hydrocodone (Norco)  **Note: The testing scope of this panel does not include following  reported medications:  Acetaminophen (Norco)  Albuterol  Aspirin  Atorvastatin (Lipitor)  Cyanocobalamin  Diltiazem  Dorzolamide  Fluticasone (Advair)  Latanoprost (Xalatan)  Meloxicam (Mobic)  Potassium (Klor-Con)  Pregabalin (Lyrica)  Ramipril (Altace)  Salmeterol (Advair)  Spironolactone (Aldactone)  Vitamin D ==================================================================== For clinical consultation, please call [phone]. ====================================================================    UDS interpretation: Compliant          Medication Assessment Form: Reviewed. Patient indicates being compliant with therapy Treatment compliance: Compliant Risk Assessment Profile: Aberrant behavior: See prior evaluations. None observed or detected today Comorbid factors increasing risk of overdose: See prior notes. No additional risks detected today Risk of substance use disorder (SUD): Low Opioid Risk  Tool (ORT) Total Score: 0  Interpretation Table:  Score <3 = Low Risk for SUD  Score between 4-7 = Moderate Risk for SUD  Score >8 = High Risk for Opioid Abuse   Risk Mitigation Strategies:  Patient Counseling: Covered Patient-Prescriber Agreement (PPA): Present and active  Notification to other healthcare providers: Done  Pharmacologic Plan: No change in therapy, at this time  Laboratory Chemistry  Inflammation Markers (CRP: Acute Phase) (ESR: Chronic Phase) Lab Results  Component Value Date   CRP <0.8 02/14/2017   ESRSEDRATE 20 02/14/2017                 Renal Function Markers Lab Results  Component Value Date   BUN 9 06/14/2016   CREATININE 0.71 06/14/2016   GFRAA 99 06/14/2016   GFRNONAA 86 06/14/2016                 Hepatic Function Markers Lab Results  Component Value Date   AST 24 06/14/2016   ALT 15 06/14/2016   ALBUMIN 4.4 06/14/2016   ALKPHOS 138 (H) 06/14/2016                 Electrolytes Lab Results  Component Value Date   NA 139 06/14/2016   K 4.3 06/14/2016   CL 97 06/14/2016   CALCIUM 9.6 06/14/2016   MG 1.7 02/14/2017                 Neuropathy Markers Lab Results  Component Value Date   VITAMINB12 1,220 (H) 06/14/2016                 Bone Pathology Markers Lab Results  Component Value Date   ALKPHOS 138 (H) 06/14/2016   VD25OH 32.7 06/14/2016   CALCIUM 9.6 06/14/2016                 Coagulation Parameters Lab Results  Component Value Date   PLT 267 06/14/2016                 Cardiovascular Markers Lab Results  Component Value Date   BNP 78.2 06/14/2016   HGB 14.8 06/14/2016   HCT 43.0 06/14/2016                 Note: Lab results reviewed.  Recent Diagnostic Imaging Review  Dg Thoracic Spine 2 View  Result Date: 02/14/2017 CLINICAL DATA:  Back pain for several years, no known injury, initial encounter EXAM: THORACIC SPINE 2 VIEWS COMPARISON:  02/03/2010 FINDINGS: Vertebral body height is well maintained. The pedicles  are within normal limits. No paraspinal mass lesion is noted. A mild scoliosis concave to the right is noted centered about the T10 vertebral body. IMPRESSION: Mild scoliosis. Electronically Signed   By: Inez Catalina M.D.   On: 02/14/2017 13:58   Dg Lumbar Spine Complete W/bend  Result Date: 02/14/2017 CLINICAL DATA:  Low back  pain.  Prior surgery.  No known injury. EXAM: LUMBAR SPINE - COMPLETE WITH BENDING VIEWS COMPARISON:  CT 11/18/2016. FINDINGS: Lumbar vertebra numbered as per prior exam. L4 through S1 posterior and interbody fusion with anatomic alignment. Hardware intact. Diffuse severe osteopenia and degenerative change. Lumbar scoliosis concave left. No acute abnormality identified. Aortoiliac atherosclerotic vascular disease. IMPRESSION: 1. L4 through S1 posterior and interbody fusion with anatomic alignment. Hardware intact long 2. Diffuse severe osteopenia degenerative change. Lumbar spine scoliosis concave left. No acute bony abnormality identified. 3. Aortoiliac atherosclerotic vascular disease. Electronically Signed   By: Maisie Fus  Register   On: 02/14/2017 13:55   Note: Imaging results reviewed.          Meds   Current Meds  Medication Sig  . albuterol (PROVENTIL HFA;VENTOLIN HFA) 108 (90 Base) MCG/ACT inhaler ALBUTEROL, 90MCG/ACT (Inhalation Aerosol Solution)  2 PUFFS Every Day, As Needed for 0 days  Quantity: 0.00;  Refills: 0   Ordered :09-Jan-2014  Denna Haggard, MA, [place] ;  Started 10-Aug-2009 Active Comments: Medication taken as needed.   Marland Kitchen aspirin 325 MG tablet Take 325 mg by mouth daily.  Marland Kitchen atorvastatin (LIPITOR) 40 MG tablet Take 40 mg by mouth daily.  . cholecalciferol (VITAMIN D) 1000 units tablet Take 1 tablet (1,000 Units total) by mouth daily.  Marland Kitchen diltiazem (CARDIZEM) 120 MG tablet Take 1 tablet (120 mg total) by mouth daily.  . dorzolamide (TRUSOPT) 2 % ophthalmic solution   . Fluticasone-Salmeterol (ADVAIR DISKUS) 250-50 MCG/DOSE AEPB USE 1 INHALATION TWICE  A DAY  . KLOR-CON M20 20 MEQ tablet Take 20 mEq by mouth daily.   Marland Kitchen latanoprost (XALATAN) 0.005 % ophthalmic solution 1 drop at bedtime.  . meloxicam (MOBIC) 15 MG tablet Take 1 tablet (15 mg total) by mouth daily.  . pregabalin (LYRICA) 150 MG capsule Take 1 capsule (150 mg total) by mouth every 8 (eight) hours.  . ramipril (ALTACE) 10 MG capsule TAKE 1 CAPSULE DAILY FOR BLOOD PRESSURE  . spironolactone (ALDACTONE) 25 MG tablet Take 1 tablet (25 mg total) by mouth daily.  . vitamin B-12 (CYANOCOBALAMIN) 500 MCG tablet Take 500 mcg by mouth daily.  . [DISCONTINUED] pregabalin (LYRICA) 150 MG capsule Take 1 capsule (150 mg total) by mouth every 8 (eight) hours.    ROS  Constitutional: Denies any fever or chills Gastrointestinal: No reported hemesis, hematochezia, vomiting, or acute GI distress Musculoskeletal: Denies any acute onset joint swelling, redness, loss of ROM, or weakness Neurological: No reported episodes of acute onset apraxia, aphasia, dysarthria, agnosia, amnesia, paralysis, loss of coordination, or loss of consciousness  Allergies  Ms. Danko is allergic to codeine; cyclobenzaprine; and penicillins.  PFSH  Drug: Ms. Walsworth  reports that she does not use drugs. Alcohol:  reports that she drinks alcohol. Tobacco:  reports that she has been smoking Cigarettes.  She has a 50.00 pack-year smoking history. She has never used smokeless tobacco. Medical:  has a past medical history of Glaucoma and Hypertension. Surgical: Ms. Lizardo  has a past surgical history that includes Back surgery; Eye surgery; and Carpal tunnel release (Left). Family: family history includes Cancer in her father and mother.  Constitutional Exam  General appearance: Well nourished, well developed, and well hydrated. In no apparent acute distress Vitals:   05/02/17 0914  BP: (!) 195/91  Pulse: 86  Resp: 18  Temp: 98.3 F (36.8 C)  TempSrc: Oral  SpO2: 96%  Weight: 118 lb (53.5 kg)  Height: 4'  11" (1.499 m)   BMI  Assessment: Estimated body mass index is 23.83 kg/m as calculated from the following:   Height as of this encounter: '4\' 11"'$  (1.499 m).   Weight as of this encounter: 118 lb (53.5 kg).  BMI interpretation table: BMI level Category Range association with higher incidence of chronic pain  <18 kg/m2 Underweight   18.5-24.9 kg/m2 Ideal body weight   25-29.9 kg/m2 Overweight Increased incidence by 20%  30-34.9 kg/m2 Obese (Class I) Increased incidence by 68%  35-39.9 kg/m2 Severe obesity (Class II) Increased incidence by 136%  >40 kg/m2 Extreme obesity (Class III) Increased incidence by 254%   BMI Readings from Last 4 Encounters:  05/02/17 23.83 kg/m  01/31/17 23.83 kg/m  10/27/16 23.83 kg/m  07/25/16 23.83 kg/m   Wt Readings from Last 4 Encounters:  05/02/17 118 lb (53.5 kg)  01/31/17 118 lb (53.5 kg)  10/27/16 118 lb (53.5 kg)  07/25/16 118 lb (53.5 kg)  Psych/Mental status: Alert, oriented x 3 (person, place, & time)       Eyes: PERLA Respiratory: No evidence of acute respiratory distress  Cervical Spine Exam  Inspection: No masses, redness, or swelling Alignment: Symmetrical Functional ROM: Unrestricted ROM      Stability: No instability detected Muscle strength & Tone: Functionally intact Sensory: Unimpaired Palpation: No palpable anomalies              Upper Extremity (UE) Exam    Side: Right upper extremity  Side: Left upper extremity  Inspection: No masses, redness, swelling, or asymmetry. No contractures  Inspection: No masses, redness, swelling, or asymmetry. No contractures  Functional ROM: Unrestricted ROM          Functional ROM: Unrestricted ROM          Muscle strength & Tone: Functionally intact  Muscle strength & Tone: Functionally intact  Sensory: Unimpaired  Sensory: Unimpaired  Palpation: No palpable anomalies              Palpation: No palpable anomalies              Specialized Test(s): Deferred         Specialized Test(s):  Deferred          Thoracic Spine Exam  Inspection: No masses, redness, or swelling Alignment: Symmetrical Functional ROM: Unrestricted ROM Stability: No instability detected Sensory: Unimpaired Muscle strength & Tone: No palpable anomalies  Lumbar Spine Exam  Inspection: Well healed scar from previous spine surgery detected Alignment: Symmetrical Functional ROM: Unrestricted ROM      Stability: No instability detected Muscle strength & Tone: Functionally intact Sensory: Unimpaired Palpation: Complains of area being tender to palpation       Provocative Tests: Lumbar Hyperextension and rotation test: Positive bilaterally for facet joint pain. Patrick's Maneuver: evaluation deferred today                    Gait & Posture Assessment  Ambulation: Patient ambulates using a cane Gait: Relatively normal for age and body habitus Posture: WNL   Lower Extremity Exam    Side: Right lower extremity  Side: Left lower extremity  Inspection: No masses, redness, swelling, or asymmetry. No contractures  Inspection: No masses, redness, swelling, or asymmetry. No contractures  Functional ROM: Unrestricted ROM          Functional ROM: Unrestricted ROM          Muscle strength & Tone: Functionally intact  Muscle strength & Tone: Functionally intact  Sensory: Unimpaired  Sensory: Unimpaired  Palpation: No  palpable anomalies  Palpation: No palpable anomalies   Assessment  Primary Diagnosis & Pertinent Problem List: The primary encounter diagnosis was Lumbar spondylosis. Diagnoses of Chronic low back pain (Location of Secondary source of pain) (Bilateral) (L>R), Lumbar facet syndrome (Location of Primary Source of Pain) (Bilateral) (L>R), Lumbar Postlaminectomy syndrome (L4-5), Fibromyalgia, Chronic pain syndrome, and Long term current use of opiate analgesic were also pertinent to this visit.  Status Diagnosis  Controlled Controlled Controlled 1. Lumbar spondylosis   2. Chronic low back pain  (Location of Secondary source of pain) (Bilateral) (L>R)   3. Lumbar facet syndrome (Location of Primary Source of Pain) (Bilateral) (L>R)   4. Lumbar Postlaminectomy syndrome (L4-5)   5. Fibromyalgia   6. Chronic pain syndrome   7. Long term current use of opiate analgesic     Problems updated and reviewed during this visit: No problems updated. Plan of Care  Pharmacotherapy (Medications Ordered): Meds ordered this encounter  Medications  . HYDROcodone-acetaminophen (NORCO) 7.5-325 MG tablet    Sig: Take 1 tablet by mouth every 6 (six) hours as needed for severe pain.    Dispense:  120 tablet    Refill:  0    Do not place this medication, or any other prescription from our practice, on "Automatic Refill". Patient may have prescription filled one day early if pharmacy is closed on scheduled refill date. Do not fill until: 07/01/17 To last until: 07/31/17    Order Specific Question:   Supervising Provider    Answer:   Milinda Pointer [phone]  . HYDROcodone-acetaminophen (NORCO) 7.5-325 MG tablet    Sig: Take 1 tablet by mouth every 6 (six) hours as needed for severe pain.    Dispense:  120 tablet    Refill:  0    Do not place this medication, or any other prescription from our practice, on "Automatic Refill". Patient may have prescription filled one day early if pharmacy is closed on scheduled refill date. Do not fill until: 05/02/17 To last until: 06/01/17    Order Specific Question:   Supervising Provider    Answer:   Milinda Pointer [phone]  . HYDROcodone-acetaminophen (NORCO) 7.5-325 MG tablet    Sig: Take 1 tablet by mouth every 6 (six) hours as needed for severe pain.    Dispense:  120 tablet    Refill:  0    Do not place this medication, or any other prescription from our practice, on "Automatic Refill". Patient may have prescription filled one day early if pharmacy is closed on scheduled refill date. Do not fill until:06/01/17  To last until: 07/01/17    Order  Specific Question:   Supervising Provider    Answer:   Milinda Pointer [phone]  . pregabalin (LYRICA) 150 MG capsule    Sig: Take 1 capsule (150 mg total) by mouth every 8 (eight) hours.    Dispense:  270 capsule    Refill:  0    Do not place this medication, or any other prescription from our practice, on "Automatic Refill". Patient may have prescription filled one day early if pharmacy is closed on scheduled refill date. 3 month supply.    Order Specific Question:   Supervising Provider    Answer:   Milinda Pointer [[id number]]   New Prescriptions   No medications on file   Medications administered today: Ms. Mcnorton had no medications administered during this visit. Lab-work, procedure(s), and/or referral(s): Orders Placed This Encounter  Procedures  . ToxASSURE Select 13 (MW), Urine  Imaging and/or referral(s): None  Interventional therapies: Planned, scheduled, and/or pending:      Considering:   Diagnostic bilateral intra-articular hip joint injection  Possible bilateral hip joint radiofrequency ablation. Diagnostic bilateral lumbar facet block  Possible bilateral lumbar facet radiofrequency ablation  Diagnostic bilateral L4-L5 transforaminal epidural steroid injection  Diagnostic caudal epidural steroid injection  Diagnostic caudal epidurogram  Possible right sided epidural lysis of adhesions.  Diagnostic left-sided L2-3 lumbar epidural steroid injection  Possible bilateral Lumbar spinal cord stimulator trial implant.    Palliative PRN treatment(s):   Diagnostic bilateral intra-articular hip joint injection  Diagnostic bilateral lumbar facet block  Diagnostic bilateral L4-L5 transforaminal epidural steroid injection  Diagnostic caudal epidural steroid injection  Diagnostic caudal epidurogram  Diagnostic left-sided L2-3 lumbar epidural steroid injection      Provider-requested follow-up: Return in about 3 months (around 08/02/2017) for MedMgmt.  Future  Appointments Date Time Provider [place]  08/01/2017 11:30 AM Vevelyn Francois, NP [facility] None   Primary Care Physician: Rubye Beach Location: [facility] Outpatient [facility] Management Facility Note by: Vevelyn Francois NP Date: 05/02/2017; Time: 2:28 PM  Pain Score Disclaimer: We use the NRS-11 scale. This is a self-reported, subjective measurement of pain severity with only modest accuracy. It is used primarily to identify changes within a particular patient. It must be understood that outpatient pain scales are significantly less accurate that those used for research, where they can be applied under ideal controlled circumstances with minimal exposure to variables. In reality, the score is likely to be a combination of pain intensity and pain affect, where pain affect describes the degree of emotional arousal or changes in action readiness caused by the sensory experience of pain. Factors such as social and work situation, setting, emotional state, anxiety levels, expectation, and prior pain experience may influence pain perception and show large inter-individual differences that may also be affected by time variables.  Patient instructions provided during this appointment: Patient Instructions   ____________________________________________________________________________________________  Medication Rules  Applies to: All patients receiving prescriptions (written or electronic).  Pharmacy of record: Pharmacy where electronic prescriptions will be sent. If written prescriptions are taken to a different pharmacy, please inform the nursing staff. The pharmacy listed in the electronic medical record should be the one where you would like electronic prescriptions to be sent.  Prescription refills: Only during scheduled appointments. Applies to both, written and electronic prescriptions.  NOTE: The following applies primarily to controlled substances (Opioid* Pain Medications).    Patient's responsibilities: 1. Pain Pills: Bring all pain pills to every appointment (except for procedure appointments). 2. Pill Bottles: Bring pills in original pharmacy bottle. Always bring newest bottle. Bring bottle, even if empty. 3. Medication refills: You are responsible for knowing and keeping track of what medications you need refilled. The day before your appointment, write a list of all prescriptions that need to be refilled. Bring that list to your appointment and give it to the admitting nurse. Prescriptions will be written only during appointments. If you forget a medication, it will not be "Called in", "Faxed", or "electronically sent". You will need to get another appointment to get these prescribed. 4. Prescription Accuracy: You are responsible for carefully inspecting your prescriptions before leaving our office. Have the discharge nurse carefully go over each prescription with you, before taking them home. Make sure that your name is accurately spelled, that your address is correct. Check the name and dose of your medication to make sure it is accurate. Check  the number of pills, and the written instructions to make sure they are clear and accurate. Make sure that you are given enough medication to last until your next medication refill appointment. 5. Taking Medication: Take medication as prescribed. Never take more pills than instructed. Never take medication more frequently than prescribed. Taking less pills or less frequently is permitted and encouraged, when it comes to controlled substances (written prescriptions).  6. Inform other Doctors: Always inform, all of your healthcare providers, of all the medications you take. 7. Pain Medication from other Providers: You are not allowed to accept any additional pain medication from any other Doctor or Healthcare provider. There are two exceptions to this rule. (see below) In the event that you require additional pain medication, you  are responsible for notifying us, as stated below. 8. Medication Agreement: You are responsible for carefully reading and following our Medication Agreement. This must be signed before receiving any prescriptions from our practice. Safely store a copy of your signed Agreement. Violations to the Agreement will result in no further prescriptions. (Additional copies of our Medication Agreement are available upon request.) 9. Laws, Rules, & Regulations: All patients are expected to follow all Federal and [employer], [employer], Rules, [employer]. Ignorance of the Laws does not constitute a valid excuse. The use of any illegal substances is prohibited. 10. Adopted CDC guidelines & recommendations: Target dosing levels will be at or below 60 MME/day. Use of benzodiazepines** is not recommended.  Exceptions: There are only two exceptions to the rule of not receiving pain medications from other Healthcare Providers. 1. Exception #1 (Emergencies): In the event of an emergency (i.e.: accident requiring emergency care), you are allowed to receive additional pain medication. However, you are responsible for: As soon as you are able, call our office [phone], at any time of the day or night, and leave a message stating your name, the date and nature of the emergency, and the name and dose of the medication prescribed. In the event that your call is answered by a member of our staff, make sure to document and save the date, time, and the name of the person that took your information.  2. Exception #2 (Planned Surgery): In the event that you are scheduled by another doctor or dentist to have any type of surgery or procedure, you are allowed (for a period no longer than 30 days), to receive additional pain medication, for the acute post-op pain. However, in this case, you are responsible for picking up a copy of our "Post-op Pain Management for Surgeons" handout, and giving it to your surgeon or dentist. This document  is available at our office, and does not require an appointment to obtain it. Simply go to our office during business hours (Monday-Thursday from 8:00 AM to 4:00 PM) (Friday 8:00 AM to 12:00 Noon) or if you have a scheduled appointment with [place], prior to your surgery, and ask for it by name. In addition, you will need to provide [place] with your name, name of your surgeon, type of surgery, and date of procedure or surgery.  *Opioid medications include: morphine, codeine, oxycodone, oxymorphone, hydrocodone, hydromorphone, meperidine, tramadol, tapentadol, buprenorphine, fentanyl, methadone. **Benzodiazepine medications include: diazepam (Valium), alprazolam (Xanax), clonazepam (Klonopine), lorazepam (Ativan), clorazepate (Tranxene), chlordiazepoxide (Librium), estazolam (Prosom), oxazepam (Serax), temazepam (Restoril), triazolam (Halcion)  ____________________________________________________________________________________________

## 2017-05-02 ENCOUNTER — Encounter: Payer: Self-pay | Admitting: Nurse Practitioner

## 2017-05-02 ENCOUNTER — Ambulatory Visit: Payer: Medicare Other | Attending: Nurse Practitioner | Admitting: Nurse Practitioner

## 2017-05-02 VITALS — BP 195/91 | HR 86 | Temp 98.3°F | Resp 18 | Ht 59.0 in | Wt 118.0 lb

## 2017-05-02 DIAGNOSIS — E7800 Pure hypercholesterolemia, unspecified: Secondary | ICD-10-CM | POA: Diagnosis not present

## 2017-05-02 DIAGNOSIS — M545 Low back pain: Secondary | ICD-10-CM | POA: Diagnosis present

## 2017-05-02 DIAGNOSIS — Z880 Allergy status to penicillin: Secondary | ICD-10-CM | POA: Diagnosis not present

## 2017-05-02 DIAGNOSIS — M5441 Lumbago with sciatica, right side: Secondary | ICD-10-CM | POA: Diagnosis not present

## 2017-05-02 DIAGNOSIS — H409 Unspecified glaucoma: Secondary | ICD-10-CM | POA: Diagnosis not present

## 2017-05-02 DIAGNOSIS — M4726 Other spondylosis with radiculopathy, lumbar region: Secondary | ICD-10-CM | POA: Insufficient documentation

## 2017-05-02 DIAGNOSIS — F17210 Nicotine dependence, cigarettes, uncomplicated: Secondary | ICD-10-CM | POA: Insufficient documentation

## 2017-05-02 DIAGNOSIS — I10 Essential (primary) hypertension: Secondary | ICD-10-CM | POA: Diagnosis not present

## 2017-05-02 DIAGNOSIS — Z885 Allergy status to narcotic agent status: Secondary | ICD-10-CM | POA: Insufficient documentation

## 2017-05-02 DIAGNOSIS — E538 Deficiency of other specified B group vitamins: Secondary | ICD-10-CM | POA: Insufficient documentation

## 2017-05-02 DIAGNOSIS — G8929 Other chronic pain: Secondary | ICD-10-CM

## 2017-05-02 DIAGNOSIS — M797 Fibromyalgia: Secondary | ICD-10-CM | POA: Insufficient documentation

## 2017-05-02 DIAGNOSIS — M4696 Unspecified inflammatory spondylopathy, lumbar region: Secondary | ICD-10-CM

## 2017-05-02 DIAGNOSIS — M961 Postlaminectomy syndrome, not elsewhere classified: Secondary | ICD-10-CM | POA: Diagnosis not present

## 2017-05-02 DIAGNOSIS — Z79899 Other long term (current) drug therapy: Secondary | ICD-10-CM | POA: Diagnosis not present

## 2017-05-02 DIAGNOSIS — K219 Gastro-esophageal reflux disease without esophagitis: Secondary | ICD-10-CM | POA: Diagnosis not present

## 2017-05-02 DIAGNOSIS — M48061 Spinal stenosis, lumbar region without neurogenic claudication: Secondary | ICD-10-CM | POA: Insufficient documentation

## 2017-05-02 DIAGNOSIS — M8580 Other specified disorders of bone density and structure, unspecified site: Secondary | ICD-10-CM | POA: Diagnosis not present

## 2017-05-02 DIAGNOSIS — M488X6 Other specified spondylopathies, lumbar region: Secondary | ICD-10-CM | POA: Diagnosis not present

## 2017-05-02 DIAGNOSIS — Z888 Allergy status to other drugs, medicaments and biological substances status: Secondary | ICD-10-CM | POA: Diagnosis not present

## 2017-05-02 DIAGNOSIS — M5442 Lumbago with sciatica, left side: Secondary | ICD-10-CM

## 2017-05-02 DIAGNOSIS — Z5181 Encounter for therapeutic drug level monitoring: Secondary | ICD-10-CM | POA: Diagnosis not present

## 2017-05-02 DIAGNOSIS — M1612 Unilateral primary osteoarthritis, left hip: Secondary | ICD-10-CM | POA: Diagnosis not present

## 2017-05-02 DIAGNOSIS — M47816 Spondylosis without myelopathy or radiculopathy, lumbar region: Secondary | ICD-10-CM | POA: Diagnosis not present

## 2017-05-02 DIAGNOSIS — G894 Chronic pain syndrome: Secondary | ICD-10-CM | POA: Insufficient documentation

## 2017-05-02 DIAGNOSIS — Z79891 Long term (current) use of opiate analgesic: Secondary | ICD-10-CM

## 2017-05-02 DIAGNOSIS — E559 Vitamin D deficiency, unspecified: Secondary | ICD-10-CM | POA: Diagnosis not present

## 2017-05-02 MED ORDER — PREGABALIN 150 MG PO CAPS: 150.0000 mg | ORAL_CAPSULE | Freq: Three times a day (TID) | ORAL | 0 refills | Status: DC

## 2017-05-02 MED ORDER — HYDROCODONE-ACETAMINOPHEN 7.5-325 MG PO TABS: 1.0000 | ORAL_TABLET | Freq: Four times a day (QID) | ORAL | 0 refills | Status: DC | PRN

## 2017-05-02 NOTE — Patient Instructions (Signed)

## 2017-05-02 NOTE — Progress Notes (Signed)
Nursing Pain Medication Assessment:  Safety precautions to be maintained throughout the outpatient stay will include: orient to surroundings, keep bed in low position, maintain call bell within reach at all times, provide assistance with transfer out of bed and ambulation.  Medication Inspection Compliance: Pill count conducted under aseptic conditions, in front of the patient. Neither the pills nor the bottle was removed from the patient's sight at any time. Once count was completed pills were immediately returned to the patient in their original bottle.  Medication: Hydrocodone/APAP Pill/Patch Count: 8 of 120 pills remain Pill/Patch Appearance: Markings consistent with prescribed medication Bottle Appearance: Standard pharmacy container. Clearly labeled. Filled Date: 06 / 10 / 2018 Last Medication intake:  Yesterday

## 2017-05-05 LAB — TOXASSURE SELECT 13 (MW), URINE

## 2017-05-28 ENCOUNTER — Other Ambulatory Visit: Payer: Self-pay | Admitting: Physician Assistant

## 2017-05-28 DIAGNOSIS — I10 Essential (primary) hypertension: Secondary | ICD-10-CM

## 2017-06-09 ENCOUNTER — Encounter: Payer: Self-pay | Admitting: Physician Assistant

## 2017-08-01 ENCOUNTER — Ambulatory Visit: Payer: Medicare Other | Attending: Nurse Practitioner | Admitting: Nurse Practitioner

## 2017-08-01 ENCOUNTER — Encounter: Payer: Self-pay | Admitting: Nurse Practitioner

## 2017-08-01 VITALS — BP 147/92 | HR 78 | Temp 98.6°F | Resp 18 | Ht 59.0 in | Wt 118.0 lb

## 2017-08-01 DIAGNOSIS — B351 Tinea unguium: Secondary | ICD-10-CM | POA: Diagnosis not present

## 2017-08-01 DIAGNOSIS — M25559 Pain in unspecified hip: Secondary | ICD-10-CM | POA: Diagnosis not present

## 2017-08-01 DIAGNOSIS — M25551 Pain in right hip: Secondary | ICD-10-CM | POA: Insufficient documentation

## 2017-08-01 DIAGNOSIS — G8929 Other chronic pain: Secondary | ICD-10-CM | POA: Diagnosis not present

## 2017-08-01 DIAGNOSIS — M25552 Pain in left hip: Secondary | ICD-10-CM | POA: Insufficient documentation

## 2017-08-01 DIAGNOSIS — E559 Vitamin D deficiency, unspecified: Secondary | ICD-10-CM | POA: Diagnosis not present

## 2017-08-01 DIAGNOSIS — G894 Chronic pain syndrome: Secondary | ICD-10-CM | POA: Insufficient documentation

## 2017-08-01 DIAGNOSIS — M5441 Lumbago with sciatica, right side: Secondary | ICD-10-CM | POA: Diagnosis not present

## 2017-08-01 DIAGNOSIS — E876 Hypokalemia: Secondary | ICD-10-CM | POA: Diagnosis not present

## 2017-08-01 DIAGNOSIS — J449 Chronic obstructive pulmonary disease, unspecified: Secondary | ICD-10-CM | POA: Insufficient documentation

## 2017-08-01 DIAGNOSIS — I472 Ventricular tachycardia: Secondary | ICD-10-CM | POA: Insufficient documentation

## 2017-08-01 DIAGNOSIS — J309 Allergic rhinitis, unspecified: Secondary | ICD-10-CM | POA: Insufficient documentation

## 2017-08-01 DIAGNOSIS — F17210 Nicotine dependence, cigarettes, uncomplicated: Secondary | ICD-10-CM | POA: Insufficient documentation

## 2017-08-01 DIAGNOSIS — M797 Fibromyalgia: Secondary | ICD-10-CM | POA: Diagnosis not present

## 2017-08-01 DIAGNOSIS — Z7982 Long term (current) use of aspirin: Secondary | ICD-10-CM | POA: Diagnosis not present

## 2017-08-01 DIAGNOSIS — F329 Major depressive disorder, single episode, unspecified: Secondary | ICD-10-CM | POA: Insufficient documentation

## 2017-08-01 DIAGNOSIS — M48061 Spinal stenosis, lumbar region without neurogenic claudication: Secondary | ICD-10-CM | POA: Insufficient documentation

## 2017-08-01 DIAGNOSIS — M5442 Lumbago with sciatica, left side: Secondary | ICD-10-CM

## 2017-08-01 DIAGNOSIS — H409 Unspecified glaucoma: Secondary | ICD-10-CM | POA: Diagnosis not present

## 2017-08-01 DIAGNOSIS — K219 Gastro-esophageal reflux disease without esophagitis: Secondary | ICD-10-CM | POA: Insufficient documentation

## 2017-08-01 DIAGNOSIS — E538 Deficiency of other specified B group vitamins: Secondary | ICD-10-CM | POA: Diagnosis not present

## 2017-08-01 DIAGNOSIS — I10 Essential (primary) hypertension: Secondary | ICD-10-CM | POA: Insufficient documentation

## 2017-08-01 DIAGNOSIS — M8580 Other specified disorders of bone density and structure, unspecified site: Secondary | ICD-10-CM | POA: Insufficient documentation

## 2017-08-01 DIAGNOSIS — Z981 Arthrodesis status: Secondary | ICD-10-CM | POA: Insufficient documentation

## 2017-08-01 DIAGNOSIS — Z79891 Long term (current) use of opiate analgesic: Secondary | ICD-10-CM | POA: Diagnosis not present

## 2017-08-01 DIAGNOSIS — E7800 Pure hypercholesterolemia, unspecified: Secondary | ICD-10-CM | POA: Insufficient documentation

## 2017-08-01 DIAGNOSIS — H401131 Primary open-angle glaucoma, bilateral, mild stage: Secondary | ICD-10-CM | POA: Diagnosis not present

## 2017-08-01 DIAGNOSIS — M5416 Radiculopathy, lumbar region: Secondary | ICD-10-CM | POA: Diagnosis not present

## 2017-08-01 DIAGNOSIS — M1612 Unilateral primary osteoarthritis, left hip: Secondary | ICD-10-CM | POA: Diagnosis not present

## 2017-08-01 DIAGNOSIS — M545 Low back pain: Secondary | ICD-10-CM | POA: Diagnosis not present

## 2017-08-01 DIAGNOSIS — M961 Postlaminectomy syndrome, not elsewhere classified: Secondary | ICD-10-CM | POA: Diagnosis not present

## 2017-08-01 DIAGNOSIS — M4726 Other spondylosis with radiculopathy, lumbar region: Secondary | ICD-10-CM | POA: Insufficient documentation

## 2017-08-01 DIAGNOSIS — M4186 Other forms of scoliosis, lumbar region: Secondary | ICD-10-CM | POA: Insufficient documentation

## 2017-08-01 MED ORDER — MELOXICAM 15 MG PO TABS: 15.0000 mg | ORAL_TABLET | Freq: Every day | ORAL | 0 refills | Status: DC

## 2017-08-01 MED ORDER — PREGABALIN 150 MG PO CAPS: 150.0000 mg | ORAL_CAPSULE | Freq: Three times a day (TID) | ORAL | 0 refills | Status: DC

## 2017-08-01 MED ORDER — HYDROCODONE-ACETAMINOPHEN 7.5-325 MG PO TABS: 1.0000 | ORAL_TABLET | Freq: Four times a day (QID) | ORAL | 0 refills | Status: DC | PRN

## 2017-08-01 NOTE — Progress Notes (Signed)
Nursing Pain Medication Assessment:  Safety precautions to be maintained throughout the outpatient stay will include: orient to surroundings, keep bed in low position, maintain call bell within reach at all times, provide assistance with transfer out of bed and ambulation.  Medication Inspection Compliance: Ms. Apgar did not comply with our request to bring her pills to be counted. She was reminded that bringing the medication bottles, even when empty, is a requirement.  Medication: None brought in. Pill/Patch Count: None available to be counted. Bottle Appearance: No container available. Did not bring bottle(s) to appointment. Filled Date: N/A Last Medication intake:  Today

## 2017-08-01 NOTE — Patient Instructions (Addendum)
____________________________________________________________________________________________  Pain Scale  Introduction: The pain score used by this practice is the Verbal Numerical Rating Scale (VNRS-11). This is an 11-point scale. It is for adults and children 10 years or older. There are significant differences in how the pain score is reported, used, and applied. Forget everything you learned in the past and learn this scoring system.  General Information: The scale should reflect your current level of pain. Unless you are specifically asked for the level of your worst pain, or your average pain. If you are asked for one of these two, then it should be understood that it is over the past 24 hours.  Basic Activities of Daily Living (ADL): Personal hygiene, dressing, eating, transferring, and using restroom.  Instructions: Most patients tend to report their level of pain as a combination of two factors, their physical pain and their psychosocial pain. This last one is also known as "suffering" and it is reflection of how physical pain affects you socially and psychologically. From now on, report them separately. From this point on, when asked to report your pain level, report only your physical pain. Use the following table for reference.  Pain Clinic Pain Levels (0-5/10)  Pain Level Score  Description  No Pain 0   Mild pain 1 Nagging, annoying, but does not interfere with basic activities of daily living (ADL). Patients are able to eat, bathe, get dressed, toileting (being able to get on and off the toilet and perform personal hygiene functions), transfer (move in and out of bed or a chair without assistance), and maintain continence (able to control bladder and bowel functions). Blood pressure and heart rate are unaffected. A normal heart rate for a healthy adult ranges from 60 to 100 bpm (beats per minute).   Mild to moderate pain 2 Noticeable and distracting. Impossible to hide from other  people. More frequent flare-ups. Still possible to adapt and function close to normal. It can be very annoying and may have occasional stronger flare-ups. With discipline, patients may get used to it and adapt.   Moderate pain 3 Interferes significantly with activities of daily living (ADL). It becomes difficult to feed, bathe, get dressed, get on and off the toilet or to perform personal hygiene functions. Difficult to get in and out of bed or a chair without assistance. Very distracting. With effort, it can be ignored when deeply involved in activities.   Moderately severe pain 4 Impossible to ignore for more than a few minutes. With effort, patients may still be able to manage work or participate in some social activities. Very difficult to concentrate. Signs of autonomic nervous system discharge are evident: dilated pupils (mydriasis); mild sweating (diaphoresis); sleep interference. Heart rate becomes elevated (>115 bpm). Diastolic blood pressure (lower number) rises above 100 mmHg. Patients find relief in laying down and not moving.   Severe pain 5 Intense and extremely unpleasant. Associated with frowning face and frequent crying. Pain overwhelms the senses.  Ability to do any activity or maintain social relationships becomes significantly limited. Conversation becomes difficult. Pacing back and forth is common, as getting into a comfortable position is nearly impossible. Pain wakes you up from deep sleep. Physical signs will be obvious: pupillary dilation; increased sweating; goosebumps; brisk reflexes; cold, clammy hands and feet; nausea, vomiting or dry heaves; loss of appetite; significant sleep disturbance with inability to fall asleep or to remain asleep. When persistent, significant weight loss is observed due to the complete loss of appetite and sleep deprivation.  Blood   pressure and heart rate becomes significantly elevated. Caution: If elevated blood pressure triggers a pounding headache  associated with blurred vision, then the patient should immediately seek attention at an urgent or emergency care unit, as these may be signs of an impending stroke.    Emergency Department Pain Levels (6-10/10)  Emergency Room Pain 6 Severely limiting. Requires emergency care and should not be seen or managed at an outpatient pain management facility. Communication becomes difficult and requires great effort. Assistance to reach the emergency department may be required. Facial flushing and profuse sweating along with potentially dangerous increases in heart rate and blood pressure will be evident.   Distressing pain 7 Self-care is very difficult. Assistance is required to transport, or use restroom. Assistance to reach the emergency department will be required. Tasks requiring coordination, such as bathing and getting dressed become very difficult.   Disabling pain 8 Self-care is no longer possible. At this level, pain is disabling. The individual is unable to do even the most "basic" activities such as walking, eating, bathing, dressing, transferring to a bed, or toileting. Fine motor skills are lost. It is difficult to think clearly.   Incapacitating pain 9 Pain becomes incapacitating. Thought processing is no longer possible. Difficult to remember your own name. Control of movement and coordination are lost.   The worst pain imaginable 10 At this level, most patients pass out from pain. When this level is reached, collapse of the autonomic nervous system occurs, leading to a sudden drop in blood pressure and heart rate. This in turn results in a temporary and dramatic drop in blood flow to the brain, leading to a loss of consciousness. Fainting is one of the body's self defense mechanisms. Passing out puts the brain in a calmed state and causes it to shut down for a while, in order to begin the healing process.    Summary: 1. Refer to this scale when providing [place] with your pain level. 2. Be  accurate and careful when reporting your pain level. This will help with your care. 3. Over-reporting your pain level will lead to loss of credibility. 4. Even a level of 1/10 means that there is pain and will be treated at our facility. 5. High, inaccurate reporting will be documented as "Symptom Exaggeration", leading to loss of credibility and suspicions of possible secondary gains such as obtaining more narcotics, or wanting to appear disabled, for fraudulent reasons. 6. Only pain levels of 5 or below will be seen at our facility. 7. Pain levels of 6 and above will be sent to the Emergency Department and the appointment cancelled. ____________________________________________________________________________________________   ____________________________________________________________________________________________  Medication Rules  Applies to: All patients receiving prescriptions (written or electronic).  Pharmacy of record: Pharmacy where electronic prescriptions will be sent. If written prescriptions are taken to a different pharmacy, please inform the nursing staff. The pharmacy listed in the electronic medical record should be the one where you would like electronic prescriptions to be sent.  Prescription refills: Only during scheduled appointments. Applies to both, written and electronic prescriptions.  NOTE: The following applies primarily to controlled substances (Opioid* Pain Medications).   Patient's responsibilities: 1. Pain Pills: Bring all pain pills to every appointment (except for procedure appointments). 2. Pill Bottles: Bring pills in original pharmacy bottle. Always bring newest bottle. Bring bottle, even if empty. 3. Medication refills: You are responsible for knowing and keeping track of what medications you need refilled. The day before your appointment, write a list of all  prescriptions that need to be refilled. Bring that list to your appointment and give it to the  admitting nurse. Prescriptions will be written only during appointments. If you forget a medication, it will not be "Called in", "Faxed", or "electronically sent". You will need to get another appointment to get these prescribed. 4. Prescription Accuracy: You are responsible for carefully inspecting your prescriptions before leaving our office. Have the discharge nurse carefully go over each prescription with you, before taking them home. Make sure that your name is accurately spelled, that your address is correct. Check the name and dose of your medication to make sure it is accurate. Check the number of pills, and the written instructions to make sure they are clear and accurate. Make sure that you are given enough medication to last until your next medication refill appointment. 5. Taking Medication: Take medication as prescribed. Never take more pills than instructed. Never take medication more frequently than prescribed. Taking less pills or less frequently is permitted and encouraged, when it comes to controlled substances (written prescriptions).  6. Inform other Doctors: Always inform, all of your healthcare providers, of all the medications you take. 7. Pain Medication from other Providers: You are not allowed to accept any additional pain medication from any other Doctor or Healthcare provider. There are two exceptions to this rule. (see below) In the event that you require additional pain medication, you are responsible for notifying us, as stated below. 8. Medication Agreement: You are responsible for carefully reading and following our Medication Agreement. This must be signed before receiving any prescriptions from our practice. Safely store a copy of your signed Agreement. Violations to the Agreement will result in no further prescriptions. (Additional copies of our Medication Agreement are available upon request.) 9. Laws, Rules, & Regulations: All patients are expected to follow all Federal  and [employer], [employer], Rules, [employer]. Ignorance of the Laws does not constitute a valid excuse. The use of any illegal substances is prohibited. 10. Adopted CDC guidelines & recommendations: Target dosing levels will be at or below 60 MME/day. Use of benzodiazepines** is not recommended.  Exceptions: There are only two exceptions to the rule of not receiving pain medications from other Healthcare Providers. 1. Exception #1 (Emergencies): In the event of an emergency (i.e.: accident requiring emergency care), you are allowed to receive additional pain medication. However, you are responsible for: As soon as you are able, call our office [phone], at any time of the day or night, and leave a message stating your name, the date and nature of the emergency, and the name and dose of the medication prescribed. In the event that your call is answered by a member of our staff, make sure to document and save the date, time, and the name of the person that took your information.  2. Exception #2 (Planned Surgery): In the event that you are scheduled by another doctor or dentist to have any type of surgery or procedure, you are allowed (for a period no longer than 30 days), to receive additional pain medication, for the acute post-op pain. However, in this case, you are responsible for picking up a copy of our "Post-op Pain Management for Surgeons" handout, and giving it to your surgeon or dentist. This document is available at our office, and does not require an appointment to obtain it. Simply go to our office during business hours (Monday-Thursday from 8:00 AM to 4:00 PM) (Friday 8:00 AM to 12:00 Noon) or if you  have a scheduled appointment with [place], prior to your surgery, and ask for it by name. In addition, you will need to provide [place] with your name, name of your surgeon, type of surgery, and date of procedure or surgery.  *Opioid medications include: morphine, codeine, oxycodone, oxymorphone,  hydrocodone, hydromorphone, meperidine, tramadol, tapentadol, buprenorphine, fentanyl, methadone. **Benzodiazepine medications include: diazepam (Valium), alprazolam (Xanax), clonazepam (Klonopine), lorazepam (Ativan), clorazepate (Tranxene), chlordiazepoxide (Librium), estazolam (Prosom), oxazepam (Serax), temazepam (Restoril), triazolam (Halcion)  ____________________________________________________________________________________________  BMI interpretation table: BMI level Category Range association with higher incidence of chronic pain  <18 kg/m2 Underweight   18.5-24.9 kg/m2 Ideal body weight   25-29.9 kg/m2 Overweight Increased incidence by 20%  30-34.9 kg/m2 Obese (Class I) Increased incidence by 68%  35-39.9 kg/m2 Severe obesity (Class II) Increased incidence by 136%  >40 kg/m2 Extreme obesity (Class III) Increased incidence by 254%   BMI Readings from Last 4 Encounters:  08/01/17 23.83 kg/m  05/02/17 23.83 kg/m  01/31/17 23.83 kg/m  10/27/16 23.83 kg/m   Wt Readings from Last 4 Encounters:  08/01/17 118 lb (53.5 kg)  05/02/17 118 lb (53.5 kg)  01/31/17 118 lb (53.5 kg)  10/27/16 118 lb (53.5 kg)

## 2017-08-01 NOTE — Progress Notes (Signed)
Patient's Name: Suzanne Avila  MRN: [id number]  Referring Provider: Florian Buff*  DOB: 1946/02/10  PCP: Mar Daring, PA-C  DOS: 08/01/2017  Note by: Vevelyn Francois NP  Service setting: Ambulatory outpatient  Specialty: Interventional Pain Management  Location: [facility] (AMB) Pain Management Facility    Patient type: Established    Primary Reason(s) for Visit: Encounter for prescription drug management. (Level of risk: moderate)  CC: Back Pain (low) and Hip Pain (left)  HPI  Suzanne Avila is a 71 y.o. year old, female patient, who comes today for a medication management evaluation. 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[place]); B12 deficiency; CAFL (chronic airflow limitation) ([place]); Narrowing of intervertebral disc space; Encounter for screening for diabetes mellitus; Acid reflux; Glaucoma; History of metabolic disorder; Benign essential HTN; Fungal infection of toenail; Osteopenia; Hypercholesterolemia without hypertriglyceridemia; Compulsive tobacco user syndrome; Absence of bladder continence; Osteoarthritis of hip (Left); Chronic hip pain (Left); Chronic lumbar radicular pain (L5 dermatome) (Location of Primary Source of Pain) (Bilateral) (L>R); Lumbar facet syndrome (Location of Primary Source of Pain) (Bilateral) (L>R); Lumbar spondylosis; 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 Her primarily concern today is the Back Pain (low) and Hip Pain (left)  Pain Assessment: Location: Lower, Left Back Radiating: radiates into left buttock Onset: More than a month ago Duration: Chronic pain Quality: Aching Severity: 7 /10 (self-reported pain score)  Note: Reported level is compatible with observation. Clinically the patient looks like a 3/10 Information on the proper use of the pain scale provided to the patient today.  Effect on ADL:   Timing: Constant Modifying factors: rest  Suzanne Avila was last scheduled for an appointment on 05/02/2017 for medication management. During today's appointment we reviewed Suzanne Avila chronic pain status, as well as her outpatient medication regimen.  The patient  reports that she does not use drugs. Her body mass index is 23.83 kg/m.  Further details on both, my assessment(s), as well as the proposed treatment plan, please see below.  Controlled Substance Pharmacotherapy Assessment REMS (Risk Evaluation and Mitigation Strategy)  Analgesic:Hydrocodone/APAP 7.5/325 one every 6 hours (30 mg per day) MME/day:30 mg/day.  Suzanne Shorter, RN  08/01/2017 11:46 AM  Signed Nursing Pain Medication Assessment:  Safety precautions to be maintained throughout the outpatient stay will include: orient to surroundings, keep bed in low position, maintain call bell within reach at all times, provide assistance with transfer out of bed and ambulation.  Medication Inspection Compliance: Suzanne Avila did not comply with our request to bring her pills to be counted. She was reminded that bringing the medication bottles, even when empty, is a requirement.  Medication: None brought in. Pill/Patch Count: None available to be counted. Bottle Appearance: No container  available. Did not bring bottle(s) to appointment. Filled Date: N/A Last Medication intake:  Today   Pharmacokinetics: Liberation and absorption (onset of  action): WNL Distribution (time to peak effect): WNL Metabolism and excretion (duration of action): WNL         Pharmacodynamics: Desired effects: Analgesia: Suzanne Avila reports >50% benefit. Functional ability: Patient reports that medication allows her to accomplish basic ADLs Clinically meaningful improvement in function (CMIF): Sustained CMIF goals met Perceived effectiveness: Described as relatively effective, allowing for increase in activities of daily living (ADL) Undesirable effects: Side-effects or Adverse reactions: None reported Monitoring: [place] PMP: Online review of the past 27-monthperiod conducted. Compliant with practice rules and regulations Last UDS on record: Summary  Date Value Ref Range Status  05/02/2017 FINAL  Final    Comment:    ==================================================================== TOXASSURE SELECT 13 (MW) ==================================================================== Test                             Result       Flag       Units Drug Present and Declared for Prescription Verification   Hydrocodone                    3267         EXPECTED   ng/mg creat   Hydromorphone                  1103         EXPECTED   ng/mg creat   Dihydrocodeine                 261          EXPECTED   ng/mg creat   Norhydrocodone                 2242         EXPECTED   ng/mg creat    Sources of hydrocodone include scheduled prescription    medications. Hydromorphone, dihydrocodeine and norhydrocodone are    expected metabolites of hydrocodone. Hydromorphone and    dihydrocodeine are also available as scheduled prescription    medications. ==================================================================== Test                      Result    Flag   Units      Ref Range   Creatinine              33                mg/dL      >=20 ==================================================================== Declared Medications:  The flagging and interpretation on this report are based on the  following declared medications.  Unexpected results may arise from  inaccuracies in the declared medications.  **Note: The testing scope of this panel includes these medications:  Hydrocodone (Hydrocodone-Acetaminophen)  **Note: The testing scope of this panel does not include following  reported medications:  Acetaminophen (Hydrocodone-Acetaminophen)  Albuterol  Aspirin  Atorvastatin  Cyanocobalamin  Diltiazem  Dorzolamide  Fluticasone (Advair)  Latanoprost  Meloxicam  Potassium (Klor-Con)  Pregabalin  Ramipril  Salmeterol (Advair)  Spironolactone  Vitamin D ==================================================================== For clinical consultation, please call ([phone] ====================================================================    UDS interpretation: Compliant          Medication Assessment Form: Reviewed. Patient indicates being compliant with therapy Treatment compliance: Compliant Risk Assessment Profile: Aberrant behavior: See prior evaluations. None observed or detected today Comorbid factors increasing risk of overdose: See prior notes. No additional risks detected today Risk of substance use disorder (SUD): Low  Opioid Risk Tool - 08/01/17 1145      Family History of Substance Abuse   Alcohol Negative   Illegal Drugs Negative   Rx Drugs Negative     Personal History of Substance Abuse   Alcohol Negative   Illegal Drugs Negative   Rx Drugs Negative     Age   Age between 83-45 years  No     History of Preadolescent Sexual Abuse   History of Preadolescent Sexual Abuse Negative or Female     Psychological Disease   Psychological Disease Negative   Depression Negative     Total Score   Opioid Risk Tool Scoring 0   Opioid Risk Interpretation Low Risk      ORT Scoring interpretation table:  Score <3 = Low Risk for SUD  Score between 4-7 = Moderate Risk for SUD  Score >8 = High Risk for Opioid Abuse   Risk Mitigation Strategies:  Patient Counseling: Covered Patient-Prescriber Agreement (PPA): Present and active  Notification to other healthcare providers: Done  Pharmacologic Plan: No change in therapy, at this time  Laboratory Chemistry  Inflammation Markers (CRP: Acute Phase) (ESR: Chronic Phase) Lab Results  Component Value Date   CRP <0.8 02/14/2017   ESRSEDRATE 20 02/14/2017                 Renal Function Markers Lab Results  Component Value Date   BUN 9 06/14/2016   CREATININE 0.71 06/14/2016   GFRAA 99 06/14/2016   GFRNONAA 86 06/14/2016                 Hepatic Function Markers Lab Results  Component Value Date   AST 24 06/14/2016   ALT 15 06/14/2016   ALBUMIN 4.4 06/14/2016   ALKPHOS 138 (H) 06/14/2016                 Electrolytes Lab Results  Component Value Date   NA 139 06/14/2016   K 4.3 06/14/2016   CL 97 06/14/2016   CALCIUM 9.6 06/14/2016   MG 1.7 02/14/2017                 Neuropathy Markers Lab Results  Component Value Date   VITAMINB12 1,220 (H) 06/14/2016                 Bone Pathology Markers Lab Results  Component Value Date   ALKPHOS 138 (H) 06/14/2016   VD25OH 32.7 06/14/2016   CALCIUM 9.6 06/14/2016                 Coagulation Parameters Lab Results  Component Value Date   PLT 267 06/14/2016                 Cardiovascular Markers Lab Results  Component Value Date   BNP 78.2 06/14/2016   HGB 14.8 06/14/2016   HCT 43.0 06/14/2016                 Note: Lab results reviewed.  Recent Diagnostic Imaging Results  DG Thoracic Spine 2 View CLINICAL DATA:  Back pain for several years, no known injury, initial encounter  EXAM: THORACIC SPINE 2 VIEWS  COMPARISON:  02/03/2010  FINDINGS: Vertebral body height is well maintained. The pedicles are within normal limits.  No paraspinal mass lesion is noted. A mild scoliosis concave to the right is noted centered about the T10 vertebral body.  IMPRESSION: Mild scoliosis.  Electronically Signed   By: Inez Catalina M.D.   On:  02/14/2017 13:58 DG Lumbar Spine Complete W/Bend CLINICAL DATA:  Low back pain.  Prior surgery.  No known injury.  EXAM: LUMBAR SPINE - COMPLETE WITH BENDING VIEWS  COMPARISON:  CT 11/18/2016.  FINDINGS: Lumbar vertebra numbered as per prior exam. L4 through S1 posterior and interbody fusion with anatomic alignment. Hardware intact. Diffuse severe osteopenia and degenerative change. Lumbar scoliosis concave left. No acute abnormality identified. Aortoiliac atherosclerotic vascular disease.  IMPRESSION: 1. L4 through S1 posterior and interbody fusion with anatomic alignment. Hardware intact long  2. Diffuse severe osteopenia degenerative change. Lumbar spine scoliosis concave left. No acute bony abnormality identified.  3. Aortoiliac atherosclerotic vascular disease.  Electronically Signed   By: Marcello Moores  Register   On: 02/14/2017 13:55  Complexity Note: Imaging results reviewed. Results shared with Ms. Rogue Bussing, using Layman's terms.                         Meds   Current Outpatient Prescriptions:  .  albuterol (PROVENTIL HFA;VENTOLIN HFA) 108 (90 Base) MCG/ACT inhaler, ALBUTEROL, 90MCG/ACT (Inhalation Aerosol Solution)  2 PUFFS Every Day, As Needed for 0 days  Quantity: 0.00;  Refills: 0   Ordered :09-Jan-2014  Celene Kras, MA, [place]ast[place] ;  Started 10-Aug-2009 Active Comments: Medication taken as needed. , Disp: , Rfl:  .  ALTACE 10 MG capsule, TAKE 1 CAPSULE DAILY FOR BLOOD PRESSURE, Disp: 90 capsule, Rfl: 3 .  aspirin 325 MG tablet, Take 325 mg by mouth daily., Disp: , Rfl:  .  atorvastatin (LIPITOR) 40 MG tablet, Take 40 mg by mouth daily., Disp: , Rfl:  .  cholecalciferol (VITAMIN D) 1000 units tablet, Take 1 tablet (1,000 Units total) by mouth daily., Disp: 90  tablet, Rfl: 3 .  diltiazem (CARDIZEM) 120 MG tablet, Take 1 tablet (120 mg total) by mouth daily., Disp: 90 tablet, Rfl: 3 .  dorzolamide (TRUSOPT) 2 % ophthalmic solution, , Disp: , Rfl:  .  Fluticasone-Salmeterol (ADVAIR DISKUS) 250-50 MCG/DOSE AEPB, USE 1 INHALATION TWICE A DAY, Disp: 180 each, Rfl: 3 .  KLOR-CON M20 20 MEQ tablet, Take 20 mEq by mouth daily. , Disp: , Rfl:  .  latanoprost (XALATAN) 0.005 % ophthalmic solution, 1 drop at bedtime., Disp: , Rfl:  .  spironolactone (ALDACTONE) 25 MG tablet, Take 1 tablet (25 mg total) by mouth daily., Disp: 90 tablet, Rfl: 3 .  vitamin B-12 (CYANOCOBALAMIN) 500 MCG tablet, Take 500 mcg by mouth daily., Disp: , Rfl:  .  HYDROcodone-acetaminophen (NORCO) 7.5-325 MG tablet, Take 1 tablet by mouth every 6 (six) hours as needed for severe pain., Disp: 120 tablet, Rfl: 0 .  [START ON 08/31/2017] HYDROcodone-acetaminophen (NORCO) 7.5-325 MG tablet, Take 1 tablet by mouth every 6 (six) hours as needed for severe pain., Disp: 120 tablet, Rfl: 0 .  [START ON 09/30/2017] HYDROcodone-acetaminophen (NORCO) 7.5-325 MG tablet, Take 1 tablet by mouth every 6 (six) hours as needed for severe pain., Disp: 120 tablet, Rfl: 0 .  meloxicam (MOBIC) 15 MG tablet, Take 1 tablet (15 mg total) by mouth daily., Disp: 90 tablet, Rfl: 0 .  pregabalin (LYRICA) 150 MG capsule, Take 1 capsule (150 mg total) by mouth every 8 (eight) hours., Disp: 270 capsule, Rfl: 0  ROS  Constitutional: Denies any fever or chills Gastrointestinal: No reported hemesis, hematochezia, vomiting, or acute GI distress Musculoskeletal: Denies any acute onset joint swelling, redness, loss of ROM, or weakness Neurological: No reported episodes of acute onset apraxia, aphasia, dysarthria, agnosia, amnesia, paralysis,  loss of coordination, or loss of consciousness  Allergies  Ms. Paris is allergic to codeine; cyclobenzaprine; and penicillins.  Candelaria Arenas  Drug: Ms. Reiger  reports that she does not use  drugs. Alcohol:  reports that she drinks alcohol. Tobacco:  reports that she has been smoking Cigarettes.  She has a 50.00 pack-year smoking history. She has never used smokeless tobacco. Medical:  has a past medical history of Glaucoma and Hypertension. Surgical: Ms. Griffey  has a past surgical history that includes Back surgery; Eye surgery; and Carpal tunnel release (Left). Family: family history includes Cancer in her father and mother.  Constitutional Exam  General appearance: Well nourished, well developed, and well hydrated. In no apparent acute distress Vitals:   08/01/17 1137  BP: (!) 147/92  Pulse: 78  Resp: 18  Temp: 98.6 F (37 C)  SpO2: 97%  Weight: 118 lb (53.5 kg)  Height: _0  (1.499 m)   BMI Assessment: Estimated body mass index is 23.83 kg/m as calculated from the following:   Height as of this encounter: _1  (1.499 m).   Weight as of this encounter: 118 lb (53.5 kg). Psych/Mental status: Alert, oriented x 3 (person, place, & time)       Eyes: PERLA Respiratory: No evidence of acute respiratory distress  Cervical Spine Area Exam  Skin & Axial Inspection: No masses, redness, edema, swelling, or associated skin lesions Alignment: Symmetrical Functional ROM: Unrestricted ROM      Stability: No instability detected Muscle Tone/Strength: Functionally intact. No obvious neuro-muscular anomalies detected. Sensory (Neurological): Unimpaired Palpation: No palpable anomalies              Upper Extremity (UE) Exam    Side: Right upper extremity  Side: Left upper extremity  Skin & Extremity Inspection: Skin color, temperature, and hair growth are WNL. No peripheral edema or cyanosis. No masses, redness, swelling, asymmetry, or associated skin lesions. No contractures.  Skin & Extremity Inspection: Skin color, temperature, and hair growth are WNL. No peripheral edema or cyanosis. No masses, redness, swelling, asymmetry, or associated skin lesions. No contractures.   Functional ROM: Unrestricted ROM          Functional ROM: Unrestricted ROM          Muscle Tone/Strength: Functionally intact. No obvious neuro-muscular anomalies detected.  Muscle Tone/Strength: Functionally intact. No obvious neuro-muscular anomalies detected.  Sensory (Neurological): Unimpaired          Sensory (Neurological): Unimpaired          Palpation: No palpable anomalies              Palpation: No palpable anomalies              Specialized Test(s): Deferred         Specialized Test(s): Deferred          Thoracic Spine Area Exam  Skin & Axial Inspection: No masses, redness, or swelling Alignment: Symmetrical Functional ROM: Unrestricted ROM Stability: No instability detected Muscle Tone/Strength: Functionally intact. No obvious neuro-muscular anomalies detected. Sensory (Neurological): Unimpaired Muscle strength & Tone: No palpable anomalies  Lumbar Spine Area Exam  Skin & Axial Inspection: No masses, redness, or swelling Alignment: Symmetrical Functional ROM: Unrestricted ROM      Stability: No instability detected Muscle Tone/Strength: Functionally intact. No obvious neuro-muscular anomalies detected. Sensory (Neurological): Unimpaired Palpation: Complains of area being tender to palpation       Provocative Tests: Lumbar Hyperextension and rotation test: evaluation deferred today  Lumbar Lateral bending test: evaluation deferred today       Patrick's Maneuver: evaluation deferred today                    Gait & Posture Assessment  Ambulation: Unassisted Gait: Age-related, senile gait pattern Posture: Poor   Lower Extremity Exam    Side: Right lower extremity  Side: Left lower extremity  Skin & Extremity Inspection: Skin color, temperature, and hair growth are WNL. No peripheral edema or cyanosis. No masses, redness, swelling, asymmetry, or associated skin lesions. No contractures.  Skin & Extremity Inspection: Skin color, temperature, and hair growth are WNL. No  peripheral edema or cyanosis. No masses, redness, swelling, asymmetry, or associated skin lesions. No contractures.  Functional ROM: Unrestricted ROM          Functional ROM: Unrestricted ROM          Muscle Tone/Strength: Functionally intact. No obvious neuro-muscular anomalies detected.  Muscle Tone/Strength: Functionally intact. No obvious neuro-muscular anomalies detected.  Sensory (Neurological): Unimpaired  Sensory (Neurological): Unimpaired  Palpation: No palpable anomalies  Palpation: No palpable anomalies   Assessment  Primary Diagnosis & Pertinent Problem List: The primary encounter diagnosis was Chronic low back pain (Location of Secondary source of pain) (Bilateral) (L>R). Diagnoses of Chronic lumbar radicular pain (L5 dermatome) (Location of Primary Source of Pain) (Bilateral) (L>R), Chronic hip pain, unspecified laterality, Arthropathy of left hip, Fibromyalgia, and Chronic pain syndrome were also pertinent to this visit.  Status Diagnosis  Controlled Controlled Controlled 1. Chronic low back pain (Location of Secondary source of pain) (Bilateral) (L>R)   2. Chronic lumbar radicular pain (L5 dermatome) (Location of Primary Source of Pain) (Bilateral) (L>R)   3. Chronic hip pain, unspecified laterality   4. Arthropathy of left hip   5. Fibromyalgia   6. Chronic pain syndrome     Problems updated and reviewed during this visit: No problems updated. Plan of Care  Pharmacotherapy (Medications Ordered): Meds ordered this encounter  Medications  . HYDROcodone-acetaminophen (NORCO) 7.5-325 MG tablet    Sig: Take 1 tablet by mouth every 6 (six) hours as needed for severe pain.    Dispense:  120 tablet    Refill:  0    Do not place this medication, or any other prescription from our practice, on "Automatic Refill". Patient may have prescription filled one day early if pharmacy is closed on scheduled refill date. Do not fill until: 08/01/2017 To last until: 08/31/2017    Order  Specific Question:   Supervising Provider    Answer:   Milinda Pointer [phone]  . HYDROcodone-acetaminophen (NORCO) 7.5-325 MG tablet    Sig: Take 1 tablet by mouth every 6 (six) hours as needed for severe pain.    Dispense:  120 tablet    Refill:  0    Do not place this medication, or any other prescription from our practice, on "Automatic Refill". Patient may have prescription filled one day early if pharmacy is closed on scheduled refill date. Do not fill until: 08/31/2017 To last until: 09/30/2017    Order Specific Question:   Supervising Provider    Answer:   Milinda Pointer [phone]  . HYDROcodone-acetaminophen (NORCO) 7.5-325 MG tablet    Sig: Take 1 tablet by mouth every 6 (six) hours as needed for severe pain.    Dispense:  120 tablet    Refill:  0    Do not place this medication, or any other prescription from our practice, on "Automatic Refill".  Patient may have prescription filled one day early if pharmacy is closed on scheduled refill date. Do not fill until:09/30/2017 To last until: 10/30/2017    Order Specific Question:   Supervising Provider    Answer:   Milinda Pointer [phone]  . meloxicam (MOBIC) 15 MG tablet    Sig: Take 1 tablet (15 mg total) by mouth daily.    Dispense:  90 tablet    Refill:  0    Patient may have prescription filled one day early if pharmacy is closed on scheduled refill date. 3 month refill.    Order Specific Question:   Supervising Provider    Answer:   Milinda Pointer [phone]  . pregabalin (LYRICA) 150 MG capsule    Sig: Take 1 capsule (150 mg total) by mouth every 8 (eight) hours.    Dispense:  270 capsule    Refill:  0    Do not place this medication, or any other prescription from our practice, on "Automatic Refill". Patient may have prescription filled one day early if pharmacy is closed on scheduled refill date. 3 month supply.    Order Specific Question:   Supervising Provider    Answer:   Milinda Pointer [[id number]]   New  Prescriptions   No medications on file   Medications administered today: Ms. Meenach had no medications administered during this visit. Lab-work, procedure(s), and/or referral(s): No orders of the defined types were placed in this encounter.  Imaging and/or referral(s): None  Interventional therapies: Planned, scheduled, and/or pending:  Not at this time   Considering:  Diagnostic bilateral intra-articular hip joint injection  Possible bilateral hip joint radiofrequency ablation. Diagnostic bilateral lumbar facet block  Possible bilateral lumbar facet radiofrequency ablation  Diagnostic bilateral L4-L5 transforaminal epidural steroid injection  Diagnostic caudal epidural steroid injection  Diagnostic caudal epidurogram  Possible right sided epidural lysis of adhesions.  Diagnostic left-sided L2-3 lumbar epidural steroid injection  Possible bilateral Lumbar spinal cord stimulator trial implant.    Palliative PRN treatment(s):  Diagnostic bilateral intra-articular hip joint injection  Diagnostic bilateral lumbar facet block  Diagnostic bilateral L4-L5 transforaminal epidural steroid injection  Diagnostic caudal epidural steroid injection  Diagnostic caudal epidurogram  Diagnostic left-sided L2-3 lumbar epidural steroid injection    Provider-requested follow-up: Return in about 3 months (around 11/01/2017) for MedMgmt.  Future Appointments Date Time Provider [place]  11/01/2017 11:30 AM Vevelyn Francois, NP [facility] None   Primary Care Physician: Rubye Beach Location: [facility] Note by: Vevelyn Francois NP Date: 08/01/2017; Time: 2:34 PM  Pain Score Disclaimer: We use the NRS-11 scale. This is a self-reported, subjective measurement of pain severity with only modest accuracy. It is used primarily to identify changes within a particular patient. It must be understood that outpatient pain scales are significantly less  accurate that those used for research, where they can be applied under ideal controlled circumstances with minimal exposure to variables. In reality, the score is likely to be a combination of pain intensity and pain affect, where pain affect describes the degree of emotional arousal or changes in action readiness caused by the sensory experience of pain. Factors such as social and work situation, setting, emotional state, anxiety levels, expectation, and prior pain experience may influence pain perception and show large inter-individual differences that may also be affected by time variables.  Patient instructions provided during this appointment: Patient Instructions   ____________________________________________________________________________________________  Pain Scale  Introduction: The pain score used by this  practice is the Verbal Numerical Rating Scale (VNRS-11). This is an 11-point scale. It is for adults and children 10 years or older. There are significant differences in how the pain score is reported, used, and applied. Forget everything you learned in the past and learn this scoring system.  General Information: The scale should reflect your current level of pain. Unless you are specifically asked for the level of your worst pain, or your average pain. If you are asked for one of these two, then it should be understood that it is over the past 24 hours.  Basic Activities of Daily Living (ADL): Personal hygiene, dressing, eating, transferring, and using restroom.  Instructions: Most patients tend to report their level of pain as a combination of two factors, their physical pain and their psychosocial pain. This last one is also known as "suffering" and it is reflection of how physical pain affects you socially and psychologically. From now on, report them separately. From this point on, when asked to report your pain level, report only your physical pain. Use the following table for  reference.  Pain Clinic Pain Levels (0-5/10)  Pain Level Score  Description  No Pain 0   Mild pain 1 Nagging, annoying, but does not interfere with basic activities of daily living (ADL). Patients are able to eat, bathe, get dressed, toileting (being able to get on and off the toilet and perform personal hygiene functions), transfer (move in and out of bed or a chair without assistance), and maintain continence (able to control bladder and bowel functions). Blood pressure and heart rate are unaffected. A normal heart rate for a healthy adult ranges from 60 to 100 bpm (beats per minute).   Mild to moderate pain 2 Noticeable and distracting. Impossible to hide from other people. More frequent flare-ups. Still possible to adapt and function close to normal. It can be very annoying and may have occasional stronger flare-ups. With discipline, patients may get used to it and adapt.   Moderate pain 3 Interferes significantly with activities of daily living (ADL). It becomes difficult to feed, bathe, get dressed, get on and off the toilet or to perform personal hygiene functions. Difficult to get in and out of bed or a chair without assistance. Very distracting. With effort, it can be ignored when deeply involved in activities.   Moderately severe pain 4 Impossible to ignore for more than a few minutes. With effort, patients may still be able to manage work or participate in some social activities. Very difficult to concentrate. Signs of autonomic nervous system discharge are evident: dilated pupils (mydriasis); mild sweating (diaphoresis); sleep interference. Heart rate becomes elevated (>115 bpm). Diastolic blood pressure (lower number) rises above 100 mmHg. Patients find relief in laying down and not moving.   Severe pain 5 Intense and extremely unpleasant. Associated with frowning face and frequent crying. Pain overwhelms the senses.  Ability to do any activity or maintain social relationships becomes  significantly limited. Conversation becomes difficult. Pacing back and forth is common, as getting into a comfortable position is nearly impossible. Pain wakes you up from deep sleep. Physical signs will be obvious: pupillary dilation; increased sweating; goosebumps; brisk reflexes; cold, clammy hands and feet; nausea, vomiting or dry heaves; loss of appetite; significant sleep disturbance with inability to fall asleep or to remain asleep. When persistent, significant weight loss is observed due to the complete loss of appetite and sleep deprivation.  Blood pressure and heart rate becomes significantly elevated. Caution: If elevated blood  pressure triggers a pounding headache associated with blurred vision, then the patient should immediately seek attention at an urgent or emergency care unit, as these may be signs of an impending stroke.    Emergency Department Pain Levels (6-10/10)  Emergency Room Pain 6 Severely limiting. Requires emergency care and should not be seen or managed at an outpatient pain management facility. Communication becomes difficult and requires great effort. Assistance to reach the emergency department may be required. Facial flushing and profuse sweating along with potentially dangerous increases in heart rate and blood pressure will be evident.   Distressing pain 7 Self-care is very difficult. Assistance is required to transport, or use restroom. Assistance to reach the emergency department will be required. Tasks requiring coordination, such as bathing and getting dressed become very difficult.   Disabling pain 8 Self-care is no longer possible. At this level, pain is disabling. The individual is unable to do even the most "basic" activities such as walking, eating, bathing, dressing, transferring to a bed, or toileting. Fine motor skills are lost. It is difficult to think clearly.   Incapacitating pain 9 Pain becomes incapacitating. Thought processing is no longer possible.  Difficult to remember your own name. Control of movement and coordination are lost.   The worst pain imaginable 10 At this level, most patients pass out from pain. When this level is reached, collapse of the autonomic nervous system occurs, leading to a sudden drop in blood pressure and heart rate. This in turn results in a temporary and dramatic drop in blood flow to the brain, leading to a loss of consciousness. Fainting is one of the body's self defense mechanisms. Passing out puts the brain in a calmed state and causes it to shut down for a while, in order to begin the healing process.    Summary: 1. Refer to this scale when providing [place] with your pain level. 2. Be accurate and careful when reporting your pain level. This will help with your care. 3. Over-reporting your pain level will lead to loss of credibility. 4. Even a level of 1/10 means that there is pain and will be treated at our facility. 5. High, inaccurate reporting will be documented as "Symptom Exaggeration", leading to loss of credibility and suspicions of possible secondary gains such as obtaining more narcotics, or wanting to appear disabled, for fraudulent reasons. 6. Only pain levels of 5 or below will be seen at our facility. 7. Pain levels of 6 and above will be sent to the Emergency Department and the appointment cancelled. ____________________________________________________________________________________________   ____________________________________________________________________________________________  Medication Rules  Applies to: All patients receiving prescriptions (written or electronic).  Pharmacy of record: Pharmacy where electronic prescriptions will be sent. If written prescriptions are taken to a different pharmacy, please inform the nursing staff. The pharmacy listed in the electronic medical record should be the one where you would like electronic prescriptions to be sent.  Prescription refills:  Only during scheduled appointments. Applies to both, written and electronic prescriptions.  NOTE: The following applies primarily to controlled substances (Opioid* Pain Medications).   Patient's responsibilities: 1. Pain Pills: Bring all pain pills to every appointment (except for procedure appointments). 2. Pill Bottles: Bring pills in original pharmacy bottle. Always bring newest bottle. Bring bottle, even if empty. 3. Medication refills: You are responsible for knowing and keeping track of what medications you need refilled. The day before your appointment, write a list of all prescriptions that need to be refilled. Bring that list to your  appointment and give it to the admitting nurse. Prescriptions will be written only during appointments. If you forget a medication, it will not be "Called in", "Faxed", or "electronically sent". You will need to get another appointment to get these prescribed. 4. Prescription Accuracy: You are responsible for carefully inspecting your prescriptions before leaving our office. Have the discharge nurse carefully go over each prescription with you, before taking them home. Make sure that your name is accurately spelled, that your address is correct. Check the name and dose of your medication to make sure it is accurate. Check the number of pills, and the written instructions to make sure they are clear and accurate. Make sure that you are given enough medication to last until your next medication refill appointment. 5. Taking Medication: Take medication as prescribed. Never take more pills than instructed. Never take medication more frequently than prescribed. Taking less pills or less frequently is permitted and encouraged, when it comes to controlled substances (written prescriptions).  6. Inform other Doctors: Always inform, all of your healthcare providers, of all the medications you take. 7. Pain Medication from other Providers: You are not allowed to accept any  additional pain medication from any other Doctor or Healthcare provider. There are two exceptions to this rule. (see below) In the event that you require additional pain medication, you are responsible for notifying us, as stated below. 8. Medication Agreement: You are responsible for carefully reading and following our Medication Agreement. This must be signed before receiving any prescriptions from our practice. Safely store a copy of your signed Agreement. Violations to the Agreement will result in no further prescriptions. (Additional copies of our Medication Agreement are available upon request.) 9. Laws, Rules, & Regulations: All patients are expected to follow all Federal and [employer], [employer], Rules, [employer]. Ignorance of the Laws does not constitute a valid excuse. The use of any illegal substances is prohibited. 10. Adopted CDC guidelines & recommendations: Target dosing levels will be at or below 60 MME/day. Use of benzodiazepines** is not recommended.  Exceptions: There are only two exceptions to the rule of not receiving pain medications from other Healthcare Providers. 1. Exception #1 (Emergencies): In the event of an emergency (i.e.: accident requiring emergency care), you are allowed to receive additional pain medication. However, you are responsible for: As soon as you are able, call our office [phone], at any time of the day or night, and leave a message stating your name, the date and nature of the emergency, and the name and dose of the medication prescribed. In the event that your call is answered by a member of our staff, make sure to document and save the date, time, and the name of the person that took your information.  2. Exception #2 (Planned Surgery): In the event that you are scheduled by another doctor or dentist to have any type of surgery or procedure, you are allowed (for a period no longer than 30 days), to receive additional pain medication, for the acute  post-op pain. However, in this case, you are responsible for picking up a copy of our "Post-op Pain Management for Surgeons" handout, and giving it to your surgeon or dentist. This document is available at our office, and does not require an appointment to obtain it. Simply go to our office during business hours (Monday-Thursday from 8:00 AM to 4:00 PM) (Friday 8:00 AM to 12:00 Noon) or if you have a scheduled appointment with [place], prior to your surgery, and  ask for it by name. In addition, you will need to provide [place] with your name, name of your surgeon, type of surgery, and date of procedure or surgery.  *Opioid medications include: morphine, codeine, oxycodone, oxymorphone, hydrocodone, hydromorphone, meperidine, tramadol, tapentadol, buprenorphine, fentanyl, methadone. **Benzodiazepine medications include: diazepam (Valium), alprazolam (Xanax), clonazepam (Klonopine), lorazepam (Ativan), clorazepate (Tranxene), chlordiazepoxide (Librium), estazolam (Prosom), oxazepam (Serax), temazepam (Restoril), triazolam (Halcion)  ____________________________________________________________________________________________  BMI interpretation table: BMI level Category Range association with higher incidence of chronic pain  <18 kg/m2 Underweight   18.5-24.9 kg/m2 Ideal body weight   25-29.9 kg/m2 Overweight Increased incidence by 20%  30-34.9 kg/m2 Obese (Class I) Increased incidence by 68%  35-39.9 kg/m2 Severe obesity (Class II) Increased incidence by 136%  >40 kg/m2 Extreme obesity (Class III) Increased incidence by 254%   BMI Readings from Last 4 Encounters:  08/01/17 23.83 kg/m  05/02/17 23.83 kg/m  01/31/17 23.83 kg/m  10/27/16 23.83 kg/m   Wt Readings from Last 4 Encounters:  08/01/17 118 lb (53.5 kg)  05/02/17 118 lb (53.5 kg)  01/31/17 118 lb (53.5 kg)  10/27/16 118 lb (53.5 kg)

## 2017-08-04 ENCOUNTER — Ambulatory Visit: Payer: Medicare Other | Attending: Nurse Practitioner | Admitting: Nurse Practitioner

## 2017-08-04 ENCOUNTER — Encounter: Payer: Self-pay | Admitting: Nurse Practitioner

## 2017-08-04 VITALS — BP 156/82 | HR 77 | Temp 98.4°F | Resp 16 | Ht <= 58 in | Wt 118.0 lb

## 2017-08-04 DIAGNOSIS — Z0489 Encounter for examination and observation for other specified reasons: Secondary | ICD-10-CM | POA: Diagnosis not present

## 2017-08-04 DIAGNOSIS — G894 Chronic pain syndrome: Secondary | ICD-10-CM | POA: Diagnosis not present

## 2017-08-04 NOTE — Progress Notes (Signed)
Nursing Pain Medication Assessment:  Safety precautions to be maintained throughout the outpatient stay will include: orient to surroundings, keep bed in low position, maintain call bell within reach at all times, provide assistance with transfer out of bed and ambulation.  Medication Inspection Compliance: Pill count conducted under aseptic conditions, in front of the patient. Neither the pills nor the bottle was removed from the patient's sight at any time. Once count was completed pills were immediately returned to the patient in their original bottle.  Medication: Hydrocodone/APAP Pill/Patch Count: 6 of 120 pills remain Pill/Patch Appearance: Markings consistent with prescribed medication Bottle Appearance: Standard pharmacy container. Clearly labeled. Filled Date: 09 / 09 / 2018 Last Medication intake:  Today   Nurse visit for pill count.

## 2017-08-04 NOTE — Progress Notes (Deleted)
Patient here today for pill count d/t the fact that she did not bring her pill bottle to last appt time.

## 2017-08-07 ENCOUNTER — Ambulatory Visit: Payer: Medicare Other

## 2017-11-01 ENCOUNTER — Encounter: Payer: Self-pay | Admitting: Nurse Practitioner

## 2017-11-01 ENCOUNTER — Ambulatory Visit: Payer: Medicare Other | Attending: Nurse Practitioner | Admitting: Nurse Practitioner

## 2017-11-01 ENCOUNTER — Other Ambulatory Visit: Payer: Self-pay

## 2017-11-01 VITALS — BP 187/76 | HR 83 | Temp 98.3°F | Ht 59.0 in | Wt 118.0 lb

## 2017-11-01 DIAGNOSIS — M48061 Spinal stenosis, lumbar region without neurogenic claudication: Secondary | ICD-10-CM | POA: Insufficient documentation

## 2017-11-01 DIAGNOSIS — H409 Unspecified glaucoma: Secondary | ICD-10-CM | POA: Diagnosis not present

## 2017-11-01 DIAGNOSIS — F17210 Nicotine dependence, cigarettes, uncomplicated: Secondary | ICD-10-CM | POA: Insufficient documentation

## 2017-11-01 DIAGNOSIS — E559 Vitamin D deficiency, unspecified: Secondary | ICD-10-CM | POA: Insufficient documentation

## 2017-11-01 DIAGNOSIS — M8580 Other specified disorders of bone density and structure, unspecified site: Secondary | ICD-10-CM | POA: Insufficient documentation

## 2017-11-01 DIAGNOSIS — Z885 Allergy status to narcotic agent status: Secondary | ICD-10-CM | POA: Diagnosis not present

## 2017-11-01 DIAGNOSIS — M1612 Unilateral primary osteoarthritis, left hip: Secondary | ICD-10-CM | POA: Diagnosis not present

## 2017-11-01 DIAGNOSIS — B351 Tinea unguium: Secondary | ICD-10-CM | POA: Diagnosis not present

## 2017-11-01 DIAGNOSIS — Z79899 Other long term (current) drug therapy: Secondary | ICD-10-CM | POA: Diagnosis not present

## 2017-11-01 DIAGNOSIS — M5416 Radiculopathy, lumbar region: Secondary | ICD-10-CM

## 2017-11-01 DIAGNOSIS — M797 Fibromyalgia: Secondary | ICD-10-CM | POA: Diagnosis not present

## 2017-11-01 DIAGNOSIS — M25552 Pain in left hip: Secondary | ICD-10-CM

## 2017-11-01 DIAGNOSIS — E7800 Pure hypercholesterolemia, unspecified: Secondary | ICD-10-CM | POA: Diagnosis not present

## 2017-11-01 DIAGNOSIS — E876 Hypokalemia: Secondary | ICD-10-CM | POA: Insufficient documentation

## 2017-11-01 DIAGNOSIS — M25551 Pain in right hip: Secondary | ICD-10-CM | POA: Insufficient documentation

## 2017-11-01 DIAGNOSIS — E538 Deficiency of other specified B group vitamins: Secondary | ICD-10-CM | POA: Insufficient documentation

## 2017-11-01 DIAGNOSIS — G8929 Other chronic pain: Secondary | ICD-10-CM | POA: Diagnosis not present

## 2017-11-01 DIAGNOSIS — Z7982 Long term (current) use of aspirin: Secondary | ICD-10-CM | POA: Diagnosis not present

## 2017-11-01 DIAGNOSIS — K219 Gastro-esophageal reflux disease without esophagitis: Secondary | ICD-10-CM | POA: Diagnosis not present

## 2017-11-01 DIAGNOSIS — J309 Allergic rhinitis, unspecified: Secondary | ICD-10-CM | POA: Diagnosis not present

## 2017-11-01 DIAGNOSIS — I10 Essential (primary) hypertension: Secondary | ICD-10-CM | POA: Diagnosis not present

## 2017-11-01 DIAGNOSIS — M961 Postlaminectomy syndrome, not elsewhere classified: Secondary | ICD-10-CM | POA: Insufficient documentation

## 2017-11-01 DIAGNOSIS — M47816 Spondylosis without myelopathy or radiculopathy, lumbar region: Secondary | ICD-10-CM

## 2017-11-01 DIAGNOSIS — Z880 Allergy status to penicillin: Secondary | ICD-10-CM | POA: Diagnosis not present

## 2017-11-01 DIAGNOSIS — G894 Chronic pain syndrome: Secondary | ICD-10-CM | POA: Insufficient documentation

## 2017-11-01 DIAGNOSIS — Z5181 Encounter for therapeutic drug level monitoring: Secondary | ICD-10-CM | POA: Insufficient documentation

## 2017-11-01 DIAGNOSIS — Z79891 Long term (current) use of opiate analgesic: Secondary | ICD-10-CM | POA: Diagnosis not present

## 2017-11-01 MED ORDER — MELOXICAM 15 MG PO TABS: 15.0000 mg | ORAL_TABLET | Freq: Every day | ORAL | 0 refills | Status: DC

## 2017-11-01 MED ORDER — HYDROCODONE-ACETAMINOPHEN 7.5-325 MG PO TABS: 1.0000 | ORAL_TABLET | Freq: Four times a day (QID) | ORAL | 0 refills | Status: DC | PRN

## 2017-11-01 NOTE — Progress Notes (Signed)
Patient's Name: Suzanne Avila  MRN: [id number]  Referring Provider: Florian Buff*  DOB: 10/01/1946  PCP: Mar Daring, PA-C  DOS: 11/01/2017  Note by: Vevelyn Francois NP  Service setting: Ambulatory outpatient  Specialty: Interventional Pain Management  Location: [facility] (AMB) Pain Management Facility    Patient type: Established    Primary Reason(s) for Visit: Encounter for prescription drug management. (Level of risk: moderate)  CC: Back Pain (lower)  HPI  Suzanne Avila is a 72 y.o. year old, female patient, who comes today for a medication management evaluation. 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[place]); B12 deficiency; CAFL (chronic airflow limitation) ([place]); Narrowing of intervertebral disc space; Encounter for screening for diabetes mellitus; Acid reflux; Glaucoma; History of metabolic disorder; Benign essential HTN; Fungal infection of toenail; Osteopenia; Hypercholesterolemia without hypertriglyceridemia; Compulsive tobacco user syndrome; Absence of bladder continence; Osteoarthritis of hip (Left); Chronic hip pain (Left); Chronic lumbar radicular pain (L5 dermatome) (Location of Primary Source of Pain) (Bilateral) (L>R); Lumbar facet syndrome (Location of Primary Source of Pain) (Bilateral) (L>R); Lumbar spondylosis; 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 Her primarily concern today is the Back Pain (lower)  Pain Assessment: Location: Lower, Left Back Radiating: Radiates down left side and leg to foot Onset: More than a month ago Duration: Chronic pain Quality: Aching, Radiating, Burning, Nagging, Numbness, Discomfort Severity: 7 /10 (self-reported pain score)  Note: Reported level is compatible with observation. Clinically the patient looks like a 2/10 A 2/10 is viewed as "Mild to Moderate" and described as noticeable and distracting. Impossible to hide from other people. More frequent flare-ups. Still possible to adapt and function close to normal. It can be very annoying and may have occasional stronger flare-ups. With discipline, patients may get used to it and adapt. Information on the proper use of the pain scale provided to the patient today. When using our objective Pain Scale, levels between 6 and 10/10 are said to belong in an emergency room, as it progressively worsens from a 6/10, described as severely limiting, requiring emergency care not usually available at an outpatient pain management facility. At a 6/10 level, communication becomes difficult and requires great effort. Assistance to reach the emergency department may be required. Facial flushing and profuse sweating along with potentially dangerous increases in heart rate and blood pressure will be evident. Effect on ADL:   Timing: Intermittent Modifying factors: rest and meds  Suzanne Avila was last scheduled for an appointment on 08/04/2017 for medication management. During today's appointment we reviewed Suzanne Avila's chronic pain status, as well as her outpatient medication regimen. She states that the pain is worse. She admits that it may not be a 7/10 today but it was yesterday. She states that she was  doing her laundry yesterday along with other household chores. She states that she did get interventions done in the past and  she is considering this if the pain continues. She admits that the pain is not daily. She is not sure if it comes from being over or underactive.   The patient  reports that she does not use drugs. Her body mass index is 23.83 kg/m.  Further details on both, my assessment(s), as well as the proposed treatment plan, please see below.  Controlled Substance Pharmacotherapy Assessment REMS (Risk Evaluation and Mitigation Strategy)  Analgesic:Hydrocodone/APAP 7.5/325 one every 6 hours (30 mg per day) MME/day:30 mg/day.    No notes on file Pharmacokinetics: Liberation and absorption (onset of action): WNL Distribution (time to peak effect): WNL Metabolism and excretion (duration of action): WNL         Pharmacodynamics: Desired effects: Analgesia: Ms. Schnackenberg reports >50% benefit. Functional ability: Patient reports that medication allows her to accomplish basic ADLs Clinically meaningful improvement in function (CMIF): Sustained CMIF goals met Perceived effectiveness: Described as relatively effective, allowing for increase in activities of daily living (ADL) Undesirable effects: Side-effects or Adverse reactions: None reported Monitoring: [place] PMP: Online review of the past 52-monthperiod conducted. Compliant with practice rules and regulations Last UDS on record: Summary  Date Value Ref Range Status  05/02/2017 FINAL  Final    Comment:    ==================================================================== TOXASSURE SELECT 13 (MW) ==================================================================== Test                             Result       Flag       Units Drug Present and Declared for Prescription Verification   Hydrocodone                    3267         EXPECTED   ng/mg creat   Hydromorphone                  1103         EXPECTED   ng/mg creat    Dihydrocodeine                 261          EXPECTED   ng/mg creat   Norhydrocodone                 2242         EXPECTED   ng/mg creat    Sources of hydrocodone include scheduled prescription    medications. Hydromorphone, dihydrocodeine and norhydrocodone are    expected metabolites of hydrocodone. Hydromorphone and    dihydrocodeine are also available as scheduled prescription    medications. ==================================================================== Test                      Result    Flag   Units      Ref Range   Creatinine              33               mg/dL      >=20 ==================================================================== Declared Medications:  The flagging and interpretation on this report are based on the  following declared medications.  Unexpected results may arise from  inaccuracies in the declared medications.  **Note: The testing scope of this panel includes these medications:  Hydrocodone (Hydrocodone-Acetaminophen)  **Note: The testing scope of this panel does not include following  reported medications:  Acetaminophen (Hydrocodone-Acetaminophen)  Albuterol  Aspirin  Atorvastatin  Cyanocobalamin  Diltiazem  Dorzolamide  Fluticasone (Advair)  Latanoprost  Meloxicam  Potassium (Klor-Con)  Pregabalin  Ramipril  Salmeterol (Advair)  Spironolactone  Vitamin D ==================================================================== For clinical consultation, please call [phone]. ====================================================================    UDS interpretation: Compliant          Medication Assessment Form: Reviewed. Patient indicates being compliant with therapy Treatment compliance: Compliant Risk Assessment Profile: Aberrant behavior: See prior evaluations. None observed or detected today Comorbid factors increasing risk of overdose: See prior notes. No additional risks detected today Risk of substance use disorder (SUD):  Low Opioid Risk Tool - 11/01/17 1148      Family History of Substance Abuse   Alcohol  Negative    Illegal Drugs  Negative    Rx Drugs  Negative      Personal History of Substance Abuse   Alcohol  Negative    Illegal Drugs  Negative    Rx Drugs  Negative      History of Preadolescent Sexual Abuse   History of Preadolescent Sexual Abuse  Negative or Female      Psychological Disease   Psychological Disease  Negative    Depression  Negative      Total Score   Opioid Risk Tool Scoring  0    Opioid Risk Interpretation  Low Risk      ORT Scoring interpretation table:  Score <3 = Low Risk for SUD  Score between 4-7 = Moderate Risk for SUD  Score >8 = High Risk for Opioid Abuse   Risk Mitigation Strategies:  Patient Counseling: Covered Patient-Prescriber Agreement (PPA): Present and active  Notification to other healthcare providers: Done  Pharmacologic Plan: No change in therapy, at this time.             Laboratory Chemistry  Inflammation Markers (CRP: Acute Phase) (ESR: Chronic Phase) Lab Results  Component Value Date   CRP <0.8 02/14/2017   ESRSEDRATE 20 02/14/2017                 Rheumatology Markers Lab Results  Component Value Date   RF <10.0 06/14/2016   ANA Negative 06/14/2016                Renal Function Markers Lab Results  Component Value Date   BUN 9 06/14/2016   CREATININE 0.71 06/14/2016   GFRAA 99 06/14/2016   GFRNONAA 86 06/14/2016                 Hepatic Function Markers Lab Results  Component Value Date   AST 24 06/14/2016   ALT 15 06/14/2016   ALBUMIN 4.4 06/14/2016   ALKPHOS 138 (H) 06/14/2016                 Electrolytes Lab Results  Component Value Date   NA 139 06/14/2016   K 4.3 06/14/2016   CL 97 06/14/2016   CALCIUM 9.6 06/14/2016   MG 1.7 02/14/2017                 Neuropathy Markers Lab Results  Component Value Date   VITAMINB12 1,220 (H) 06/14/2016                 Bone Pathology Markers Lab Results   Component Value Date   VD25OH 32.7 06/14/2016                 Coagulation Parameters Lab Results  Component Value Date   PLT 267 06/14/2016  Cardiovascular Markers Lab Results  Component Value Date   BNP 78.2 06/14/2016   CKTOTAL 120 06/14/2016   HGB 14.8 06/14/2016   HCT 43.0 06/14/2016                 CA Markers No results found for: CEA, CA125, LABCA2               Note: Lab results reviewed.  Recent Diagnostic Imaging Results  DG Thoracic Spine 2 View CLINICAL DATA:  Back pain for several years, no known injury, initial encounter  EXAM: THORACIC SPINE 2 VIEWS  COMPARISON:  02/03/2010  FINDINGS: Vertebral body height is well maintained. The pedicles are within normal limits. No paraspinal mass lesion is noted. A mild scoliosis concave to the right is noted centered about the T10 vertebral body.  IMPRESSION: Mild scoliosis.  Electronically Signed   By: Inez Catalina M.D.   On: 02/14/2017 13:58 DG Lumbar Spine Complete W/Bend CLINICAL DATA:  Low back pain.  Prior surgery.  No known injury.  EXAM: LUMBAR SPINE - COMPLETE WITH BENDING VIEWS  COMPARISON:  CT 11/18/2016.  FINDINGS: Lumbar vertebra numbered as per prior exam. L4 through S1 posterior and interbody fusion with anatomic alignment. Hardware intact. Diffuse severe osteopenia and degenerative change. Lumbar scoliosis concave left. No acute abnormality identified. Aortoiliac atherosclerotic vascular disease.  IMPRESSION: 1. L4 through S1 posterior and interbody fusion with anatomic alignment. Hardware intact long  2. Diffuse severe osteopenia degenerative change. Lumbar spine scoliosis concave left. No acute bony abnormality identified.  3. Aortoiliac atherosclerotic vascular disease.  Electronically Signed   By: Marcello Moores  Register   On: 02/14/2017 13:55  Complexity Note: Imaging results reviewed. Results shared with Ms. Rogue Bussing, using Layman's terms.                          Meds   Current Outpatient Medications:  .  albuterol (PROVENTIL HFA;VENTOLIN HFA) 108 (90 Base) MCG/ACT inhaler, ALBUTEROL, 90MCG/ACT (Inhalation Aerosol Solution)  2 PUFFS Every Day, As Needed for 0 days  Quantity: 0.00;  Refills: 0   Ordered :09-Jan-2014  Celene Kras, MA, Anast[place] ;  Started 10-Aug-2009 Active Comments: Medication taken as needed. , Disp: , Rfl:  .  aspirin 325 MG tablet, Take 325 mg by mouth daily., Disp: , Rfl:  .  atorvastatin (LIPITOR) 40 MG tablet, Take 40 mg by mouth daily., Disp: , Rfl:  .  cholecalciferol (VITAMIN D) 1000 units tablet, Take 1 tablet (1,000 Units total) by mouth daily., Disp: 90 tablet, Rfl: 3 .  diltiazem (CARDIZEM) 120 MG tablet, Take 1 tablet (120 mg total) by mouth daily., Disp: 90 tablet, Rfl: 3 .  dorzolamide (TRUSOPT) 2 % ophthalmic solution, , Disp: , Rfl:  .  Fluticasone-Salmeterol (ADVAIR DISKUS) 250-50 MCG/DOSE AEPB, USE 1 INHALATION TWICE A DAY, Disp: 180 each, Rfl: 3 .  KLOR-CON M20 20 MEQ tablet, Take 20 mEq by mouth daily. , Disp: , Rfl:  .  latanoprost (XALATAN) 0.005 % ophthalmic solution, 1 drop at bedtime., Disp: , Rfl:  .  vitamin B-12 (CYANOCOBALAMIN) 500 MCG tablet, Take 500 mcg by mouth daily., Disp: , Rfl:  .  [START ON 12/31/2017] HYDROcodone-acetaminophen (NORCO) 7.5-325 MG tablet, Take 1 tablet by mouth every 6 (six) hours as needed for severe pain., Disp: 120 tablet, Rfl: 0 .  [START ON 12/01/2017] HYDROcodone-acetaminophen (NORCO) 7.5-325 MG tablet, Take 1 tablet by mouth every 6 (six) hours as needed for severe pain., Disp: 120 tablet,  Rfl: 0 .  HYDROcodone-acetaminophen (NORCO) 7.5-325 MG tablet, Take 1 tablet by mouth every 6 (six) hours as needed for severe pain., Disp: 120 tablet, Rfl: 0 .  meloxicam (MOBIC) 15 MG tablet, Take 1 tablet (15 mg total) by mouth daily., Disp: 90 tablet, Rfl: 0 .  pregabalin (LYRICA) 150 MG capsule, Take 1 capsule (150 mg total) by mouth every 8 (eight) hours., Disp: 270 capsule, Rfl:  0  ROS  Constitutional: Denies any fever or chills Gastrointestinal: No reported hemesis, hematochezia, vomiting, or acute GI distress Musculoskeletal: Denies any acute onset joint swelling, redness, loss of ROM, or weakness Neurological: No reported episodes of acute onset apraxia, aphasia, dysarthria, agnosia, amnesia, paralysis, loss of coordination, or loss of consciousness  Allergies  Ms. Slutsky is allergic to codeine; cyclobenzaprine; and penicillins.  [place]  Drug: Ms. Bielicki  reports that she does not use drugs. Alcohol:  reports that she drinks alcohol. Tobacco:  reports that she has been smoking cigarettes.  She has a 50.00 pack-year smoking history. she has never used smokeless tobacco. Medical:  has a past medical history of Glaucoma and Hypertension. Surgical: Ms. Khiev  has a past surgical history that includes Back surgery; Eye surgery; and Carpal tunnel release (Left). Family: family history includes Cancer in her father and mother.  Constitutional Exam  General appearance: Well nourished, well developed, and well hydrated. In no apparent acute distress Vitals:   11/01/17 1136  BP: (!) 187/76  Pulse: 83  Temp: 98.3 F (36.8 C)  SpO2: 98%  Weight: 118 lb (53.5 kg)  Height: '4\' 11"'$  (1.499 m)   BMI Assessment: Estimated body mass index is 23.83 kg/m as calculated from the following:   Height as of this encounter: '4\' 11"'$  (1.499 m).   Weight as of this encounter: 118 lb (53.5 kg). Psych/Mental status: Alert, oriented x 3 (person, place, & time)       Eyes: PERLA Respiratory: No evidence of acute respiratory distress  Cervical Spine Area Exam  Skin & Axial Inspection: No masses, redness, edema, swelling, or associated skin lesions Alignment: Symmetrical Functional ROM: Unrestricted ROM      Stability: No instability detected Muscle Tone/Strength: Functionally intact. No obvious neuro-muscular anomalies detected. Sensory (Neurological): Unimpaired Palpation:  No palpable anomalies              Upper Extremity (UE) Exam    Side: Right upper extremity  Side: Left upper extremity  Skin & Extremity Inspection: Skin color, temperature, and hair growth are WNL. No peripheral edema or cyanosis. No masses, redness, swelling, asymmetry, or associated skin lesions. No contractures.  Skin & Extremity Inspection: Skin color, temperature, and hair growth are WNL. No peripheral edema or cyanosis. No masses, redness, swelling, asymmetry, or associated skin lesions. No contractures.  Functional ROM: Unrestricted ROM          Functional ROM: Unrestricted ROM          Muscle Tone/Strength: Functionally intact. No obvious neuro-muscular anomalies detected.  Muscle Tone/Strength: Functionally intact. No obvious neuro-muscular anomalies detected.  Sensory (Neurological): Unimpaired          Sensory (Neurological): Unimpaired          Palpation: No palpable anomalies              Palpation: No palpable anomalies              Specialized Test(s): Deferred         Specialized Test(s): Deferred  Thoracic Spine Area Exam  Skin & Axial Inspection: No masses, redness, or swelling Alignment: Symmetrical Functional ROM: Unrestricted ROM Stability: No instability detected Muscle Tone/Strength: Functionally intact. No obvious neuro-muscular anomalies detected. Sensory (Neurological): Unimpaired Muscle strength & Tone: No palpable anomalies  Lumbar Spine Area Exam  Skin & Axial Inspection: No masses, redness, or swelling Alignment: Symmetrical Functional ROM: Unrestricted ROM      Stability: No instability detected Muscle Tone/Strength: Functionally intact. No obvious neuro-muscular anomalies detected. Sensory (Neurological): Unimpaired Palpation: Complains of area being tender to palpation       Provocative Tests: Lumbar Hyperextension and rotation test: Positive on the left for facet joint pain. Lumbar Lateral bending test: evaluation deferred today        Patrick's Maneuver: evaluation deferred today                    Gait & Posture Assessment  Ambulation: Unassisted Gait: Relatively normal for age and body habitus Posture: WNL   Lower Extremity Exam    Side: Right lower extremity  Side: Left lower extremity  Skin & Extremity Inspection: Skin color, temperature, and hair growth are WNL. No peripheral edema or cyanosis. No masses, redness, swelling, asymmetry, or associated skin lesions. No contractures.  Skin & Extremity Inspection: Skin color, temperature, and hair growth are WNL. No peripheral edema or cyanosis. No masses, redness, swelling, asymmetry, or associated skin lesions. No contractures.  Functional ROM: Unrestricted ROM          Functional ROM: Unrestricted ROM          Muscle Tone/Strength: Functionally intact. No obvious neuro-muscular anomalies detected.  Muscle Tone/Strength: Functionally intact. No obvious neuro-muscular anomalies detected.  Sensory (Neurological): Unimpaired  Sensory (Neurological): Unimpaired  Palpation: No palpable anomalies  Palpation: No palpable anomalies   Assessment  Primary Diagnosis & Pertinent Problem List: The primary encounter diagnosis was Long term current use of opiate analgesic. Diagnoses of Arthropathy of left hip, Chronic pain syndrome, Chronic lumbar radicular pain (L5 dermatome) (Location of Primary Source of Pain) (Bilateral) (L>R), Lumbar facet syndrome (Location of Primary Source of Pain) (Bilateral) (L>R), and Chronic hip pain (Left) were also pertinent to this visit.  Status Diagnosis  Controlled Controlled Controlled 1. Long term current use of opiate analgesic   2. Arthropathy of left hip   3. Chronic pain syndrome   4. Chronic lumbar radicular pain (L5 dermatome) (Location of Primary Source of Pain) (Bilateral) (L>R)   5. Lumbar facet syndrome (Location of Primary Source of Pain) (Bilateral) (L>R)   6. Chronic hip pain (Left)     Problems updated and reviewed during this  visit: No problems updated. Plan of Care  Pharmacotherapy (Medications Ordered): Meds ordered this encounter  Medications  . meloxicam (MOBIC) 15 MG tablet    Sig: Take 1 tablet (15 mg total) by mouth daily.    Dispense:  90 tablet    Refill:  0    Patient may have prescription filled one day early if pharmacy is closed on scheduled refill date. 3 month refill.    Order Specific Question:   Supervising Provider    Answer:   Milinda Pointer [phone]  . HYDROcodone-acetaminophen (NORCO) 7.5-325 MG tablet    Sig: Take 1 tablet by mouth every 6 (six) hours as needed for severe pain.    Dispense:  120 tablet    Refill:  0    Do not place this medication, or any other prescription from our practice, on "Automatic Refill". Patient  may have prescription filled one day early if pharmacy is closed on scheduled refill date. Do not fill until: 12/31/2017 To last until: 01/30/2018    Order Specific Question:   Supervising Provider    Answer:   Milinda Pointer [phone]  . HYDROcodone-acetaminophen (NORCO) 7.5-325 MG tablet    Sig: Take 1 tablet by mouth every 6 (six) hours as needed for severe pain.    Dispense:  120 tablet    Refill:  0    Do not place this medication, or any other prescription from our practice, on "Automatic Refill". Patient may have prescription filled one day early if pharmacy is closed on scheduled refill date. Do not fill until: 12/01/2017 To last until: 12/31/2017    Order Specific Question:   Supervising Provider    Answer:   Milinda Pointer [phone]  . HYDROcodone-acetaminophen (NORCO) 7.5-325 MG tablet    Sig: Take 1 tablet by mouth every 6 (six) hours as needed for severe pain.    Dispense:  120 tablet    Refill:  0    Do not place this medication, or any other prescription from our practice, on "Automatic Refill". Patient may have prescription filled one day early if pharmacy is closed on scheduled refill date. Do not fill until:11/01/2017 To last until: 12/01/2017     Order Specific Question:   Supervising Provider    Answer:   Milinda Pointer [phone]  This SmartLink is deprecated. Use AVSMEDLIST instead to display the medication list for a patient. Medications administered today: Constance Whittle. Donalson had no medications administered during this visit. Lab-work, procedure(s), and/or referral(s): Orders Placed This Encounter  Procedures  . ToxASSURE Select 13 (MW), Urine   Imaging and/or referral(s): None  Interventional therapies: Planned, scheduled, and/or pending:   Not at this time.   Considering:   Diagnostic bilateral intra-articular hip joint injection under fluoroscopic guidance, without without sedation. Possible bilateral hip joint radiofrequency ablation. Diagnostic bilateral lumbar facet block under fluoroscopic guidance and IV sedation. Possible bilateral lumbar facet radiofrequency ablation under fluoroscopic guidance and IV sedation.  Diagnostic bilateral L4-L5 transforaminal epidural steroid injection under fluoroscopic guidance, with or without sedation.  Diagnostic caudal epidural steroid injection under fluoroscopic guidance, with or without sedation.  Diagnostic caudal epidurogram under fluoroscopic guidance and an IV sedation.  Possible right ask epidural lysis of adhesions.  Diagnostic left-sided L2-3 lumbar epidural steroid injection under fluoroscopic guidance, with or without sedation.  Possible bilateral Lumbar spinal cord stimulator trial implant.    Palliative PRN treatment(s):   Diagnostic bilateral intra-articular hip joint injection under fluoroscopic guidance, without without sedation.  Diagnostic bilateral lumbar facet block under fluoroscopic guidance and IV sedation.  Diagnostic bilateral L4-L5 transforaminal epidural steroid injection under fluoroscopic guidance, with or without sedation.  Diagnostic caudal epidural steroid injection under fluoroscopic guidance, with or without sedation.  Diagnostic  caudal epidurogram under fluoroscopic guidance and an IV sedation.  Diagnostic left-sided L2-3 lumbar epidural steroid injection under fluoroscopic guidance, with or without sedation.       Provider-requested follow-up: Return in about 3 months (around 01/16/2018) for MedMgmt with Me Donella Stade Edison Pace).  Future Appointments  Date Time Provider [place]  01/09/2018  9:00 AM Vevelyn Francois, NP [facility] None   Primary Care Physician: Rubye Beach Location: [facility] Outpatient Pain Management Facility Note by: Vevelyn Francois NP Date: 11/01/2017; Time: 3:52 PM  Pain Score Disclaimer: We use the NRS-11 scale. This is a self-reported, subjective measurement of pain severity with only modest  accuracy. It is used primarily to identify changes within a particular patient. It must be understood that outpatient pain scales are significantly less accurate that those used for research, where they can be applied under ideal controlled circumstances with minimal exposure to variables. In reality, the score is likely to be a combination of pain intensity and pain affect, where pain affect describes the degree of emotional arousal or changes in action readiness caused by the sensory experience of pain. Factors such as social and work situation, setting, emotional state, anxiety levels, expectation, and prior pain experience may influence pain perception and show large inter-individual differences that may also be affected by time variables.  Patient instructions provided during this appointment: Patient Instructions    ____________________________________________________________________________________________  Medication Rules  Applies to: All patients receiving prescriptions (written or electronic).  Pharmacy of record: Pharmacy where electronic prescriptions will be sent. If written prescriptions are taken to a different pharmacy, please inform the nursing staff. The pharmacy listed in  the electronic medical record should be the one where you would like electronic prescriptions to be sent.  Prescription refills: Only during scheduled appointments. Applies to both, written and electronic prescriptions.  NOTE: The following applies primarily to controlled substances (Opioid* Pain Medications).   Patient's responsibilities: 1. Pain Pills: Bring all pain pills to every appointment (except for procedure appointments). 2. Pill Bottles: Bring pills in original pharmacy bottle. Always bring newest bottle. Bring bottle, even if empty. 3. Medication refills: You are responsible for knowing and keeping track of what medications you need refilled. The day before your appointment, write a list of all prescriptions that need to be refilled. Bring that list to your appointment and give it to the admitting nurse. Prescriptions will be written only during appointments. If you forget a medication, it will not be "Called in", "Faxed", or "electronically sent". You will need to get another appointment to get these prescribed. 4. Prescription Accuracy: You are responsible for carefully inspecting your prescriptions before leaving our office. Have the discharge nurse carefully go over each prescription with you, before taking them home. Make sure that your name is accurately spelled, that your address is correct. Check the name and dose of your medication to make sure it is accurate. Check the number of pills, and the written instructions to make sure they are clear and accurate. Make sure that you are given enough medication to last until your next medication refill appointment. 5. Taking Medication: Take medication as prescribed. Never take more pills than instructed. Never take medication more frequently than prescribed. Taking less pills or less frequently is permitted and encouraged, when it comes to controlled substances (written prescriptions).  6. Inform other Doctors: Always inform, all of your  healthcare providers, of all the medications you take. 7. Pain Medication from other Providers: You are not allowed to accept any additional pain medication from any other Doctor or Healthcare provider. There are two exceptions to this rule. (see below) In the event that you require additional pain medication, you are responsible for notifying us, as stated below. 8. Medication Agreement: You are responsible for carefully reading and following our Medication Agreement. This must be signed before receiving any prescriptions from our practice. Safely store a copy of your signed Agreement. Violations to the Agreement will result in no further prescriptions. (Additional copies of our Medication Agreement are available upon request.) 9. Laws, Rules, & Regulations: All patients are expected to follow all Federal and [employer], [employer], Rules, [employer]. Ignorance of  the Laws does not constitute a valid excuse. The use of any illegal substances is prohibited. 10. Adopted CDC guidelines & recommendations: Target dosing levels will be at or below 60 MME/day. Use of benzodiazepines** is not recommended.  Exceptions: There are only two exceptions to the rule of not receiving pain medications from other Healthcare Providers. 1. Exception #1 (Emergencies): In the event of an emergency (i.e.: accident requiring emergency care), you are allowed to receive additional pain medication. However, you are responsible for: As soon as you are able, call our office [phone], at any time of the day or night, and leave a message stating your name, the date and nature of the emergency, and the name and dose of the medication prescribed. In the event that your call is answered by a member of our staff, make sure to document and save the date, time, and the name of the person that took your information.  2. Exception #2 (Planned Surgery): In the event that you are scheduled by another doctor or dentist to have any type of  surgery or procedure, you are allowed (for a period no longer than 30 days), to receive additional pain medication, for the acute post-op pain. However, in this case, you are responsible for picking up a copy of our "Post-op Pain Management for Surgeons" handout, and giving it to your surgeon or dentist. This document is available at our office, and does not require an appointment to obtain it. Simply go to our office during business hours (Monday-Thursday from 8:00 AM to 4:00 PM) (Friday 8:00 AM to 12:00 Noon) or if you have a scheduled appointment with [place], prior to your surgery, and ask for it by name. In addition, you will need to provide [place] with your name, name of your surgeon, type of surgery, and date of procedure or surgery.  *Opioid medications include: morphine, codeine, oxycodone, oxymorphone, hydrocodone, hydromorphone, meperidine, tramadol, tapentadol, buprenorphine, fentanyl, methadone. **Benzodiazepine medications include: diazepam (Valium), alprazolam (Xanax), clonazepam (Klonopine), lorazepam (Ativan), clorazepate (Tranxene), chlordiazepoxide (Librium), estazolam (Prosom), oxazepam (Serax), temazepam (Restoril), triazolam (Halcion)  ____________________________________________________________________________________________  ____________________________________________________________________________________________  Pain Scale  Introduction: The pain score used by this practice is the Verbal Numerical Rating Scale (VNRS-11). This is an 11-point scale. It is for adults and children 10 years or older. There are significant differences in how the pain score is reported, used, and applied. Forget everything you learned in the past and learn this scoring system.  General Information: The scale should reflect your current level of pain. Unless you are specifically asked for the level of your worst pain, or your average pain. If you are asked for one of these two, then it should be understood  that it is over the past 24 hours.  Basic Activities of Daily Living (ADL): Personal hygiene, dressing, eating, transferring, and using restroom.  Instructions: Most patients tend to report their level of pain as a combination of two factors, their physical pain and their psychosocial pain. This last one is also known as "suffering" and it is reflection of how physical pain affects you socially and psychologically. From now on, report them separately. From this point on, when asked to report your pain level, report only your physical pain. Use the following table for reference.  Pain Clinic Pain Levels (0-5/10)  Pain Level Score  Description  No Pain 0   Mild pain 1 Nagging, annoying, but does not interfere with basic activities of daily living (ADL). Patients are able to eat, bathe, get dressed,  toileting (being able to get on and off the toilet and perform personal hygiene functions), transfer (move in and out of bed or a chair without assistance), and maintain continence (able to control bladder and bowel functions). Blood pressure and heart rate are unaffected. A normal heart rate for a healthy adult ranges from 60 to 100 bpm (beats per minute).   Mild to moderate pain 2 Noticeable and distracting. Impossible to hide from other people. More frequent flare-ups. Still possible to adapt and function close to normal. It can be very annoying and may have occasional stronger flare-ups. With discipline, patients may get used to it and adapt.   Moderate pain 3 Interferes significantly with activities of daily living (ADL). It becomes difficult to feed, bathe, get dressed, get on and off the toilet or to perform personal hygiene functions. Difficult to get in and out of bed or a chair without assistance. Very distracting. With effort, it can be ignored when deeply involved in activities.   Moderately severe pain 4 Impossible to ignore for more than a few minutes. With effort, patients may still be able to  manage work or participate in some social activities. Very difficult to concentrate. Signs of autonomic nervous system discharge are evident: dilated pupils (mydriasis); mild sweating (diaphoresis); sleep interference. Heart rate becomes elevated (>115 bpm). Diastolic blood pressure (lower number) rises above 100 mmHg. Patients find relief in laying down and not moving.   Severe pain 5 Intense and extremely unpleasant. Associated with frowning face and frequent crying. Pain overwhelms the senses.  Ability to do any activity or maintain social relationships becomes significantly limited. Conversation becomes difficult. Pacing back and forth is common, as getting into a comfortable position is nearly impossible. Pain wakes you up from deep sleep. Physical signs will be obvious: pupillary dilation; increased sweating; goosebumps; brisk reflexes; cold, clammy hands and feet; nausea, vomiting or dry heaves; loss of appetite; significant sleep disturbance with inability to fall asleep or to remain asleep. When persistent, significant weight loss is observed due to the complete loss of appetite and sleep deprivation.  Blood pressure and heart rate becomes significantly elevated. Caution: If elevated blood pressure triggers a pounding headache associated with blurred vision, then the patient should immediately seek attention at an urgent or emergency care unit, as these may be signs of an impending stroke.    Emergency Department Pain Levels (6-10/10)  Emergency Room Pain 6 Severely limiting. Requires emergency care and should not be seen or managed at an outpatient pain management facility. Communication becomes difficult and requires great effort. Assistance to reach the emergency department may be required. Facial flushing and profuse sweating along with potentially dangerous increases in heart rate and blood pressure will be evident.   Distressing pain 7 Self-care is very difficult. Assistance is required to  transport, or use restroom. Assistance to reach the emergency department will be required. Tasks requiring coordination, such as bathing and getting dressed become very difficult.   Disabling pain 8 Self-care is no longer possible. At this level, pain is disabling. The individual is unable to do even the most "basic" activities such as walking, eating, bathing, dressing, transferring to a bed, or toileting. Fine motor skills are lost. It is difficult to think clearly.   Incapacitating pain 9 Pain becomes incapacitating. Thought processing is no longer possible. Difficult to remember your own name. Control of movement and coordination are lost.   The worst pain imaginable 10 At this level, most patients pass out from pain.  When this level is reached, collapse of the autonomic nervous system occurs, leading to a sudden drop in blood pressure and heart rate. This in turn results in a temporary and dramatic drop in blood flow to the brain, leading to a loss of consciousness. Fainting is one of the body's self defense mechanisms. Passing out puts the brain in a calmed state and causes it to shut down for a while, in order to begin the healing process.    Summary: 1. Refer to this scale when providing [place] with your pain level. 2. Be accurate and careful when reporting your pain level. This will help with your care. 3. Over-reporting your pain level will lead to loss of credibility. 4. Even a level of 1/10 means that there is pain and will be treated at our facility. 5. High, inaccurate reporting will be documented as "Symptom Exaggeration", leading to loss of credibility and suspicions of possible secondary gains such as obtaining more narcotics, or wanting to appear disabled, for fraudulent reasons. 6. Only pain levels of 5 or below will be seen at our facility. 7. Pain levels of 6 and above will be sent to the Emergency Department and the appointment  cancelled. ____________________________________________________________________________________________

## 2017-11-01 NOTE — Patient Instructions (Addendum)

## 2017-11-07 LAB — TOXASSURE SELECT 13 (MW), URINE

## 2017-11-09 ENCOUNTER — Ambulatory Visit: Payer: Medicare Other | Attending: Nurse Practitioner | Admitting: Nurse Practitioner

## 2017-11-09 DIAGNOSIS — G894 Chronic pain syndrome: Secondary | ICD-10-CM

## 2017-11-09 DIAGNOSIS — M797 Fibromyalgia: Secondary | ICD-10-CM

## 2017-11-09 MED ORDER — PREGABALIN 150 MG PO CAPS: 150.0000 mg | ORAL_CAPSULE | Freq: Three times a day (TID) | ORAL | 0 refills | Status: DC

## 2017-11-09 NOTE — Progress Notes (Signed)
Nursing Pain Medication Assessment:  Safety precautions to be maintained throughout the outpatient stay will include: orient to surroundings, keep bed in low position, maintain call bell within reach at all times, provide assistance with transfer out of bed and ambulation.  Medication Inspection Compliance: Pill count conducted under aseptic conditions, in front of the patient. Neither the pills nor the bottle was removed from the patient's sight at any time. Once count was completed pills were immediately returned to the patient in their original bottle.  Medication: Hydrocodone/APAP Pill/Patch Count: 3 of 120 pills remain Pill/Patch Appearance: Markings consistent with prescribed medication Bottle Appearance: Standard pharmacy container. Clearly labeled. Filled Date: 12/18 / 2018 Last Medication intake:  Today

## 2017-12-07 ENCOUNTER — Encounter: Payer: Self-pay | Admitting: Physician Assistant

## 2017-12-07 ENCOUNTER — Ambulatory Visit (INDEPENDENT_AMBULATORY_CARE_PROVIDER_SITE_OTHER): Payer: Medicare Other | Admitting: Physician Assistant

## 2017-12-07 VITALS — BP 160/90 | HR 72 | Temp 98.2°F | Resp 16 | Ht 59.0 in | Wt 115.8 lb

## 2017-12-07 DIAGNOSIS — K219 Gastro-esophageal reflux disease without esophagitis: Secondary | ICD-10-CM | POA: Diagnosis not present

## 2017-12-07 DIAGNOSIS — E876 Hypokalemia: Secondary | ICD-10-CM | POA: Diagnosis not present

## 2017-12-07 DIAGNOSIS — G2581 Restless legs syndrome: Secondary | ICD-10-CM

## 2017-12-07 DIAGNOSIS — I701 Atherosclerosis of renal artery: Secondary | ICD-10-CM

## 2017-12-07 DIAGNOSIS — F17210 Nicotine dependence, cigarettes, uncomplicated: Secondary | ICD-10-CM | POA: Diagnosis not present

## 2017-12-07 DIAGNOSIS — J449 Chronic obstructive pulmonary disease, unspecified: Secondary | ICD-10-CM | POA: Diagnosis not present

## 2017-12-07 DIAGNOSIS — R197 Diarrhea, unspecified: Secondary | ICD-10-CM | POA: Diagnosis not present

## 2017-12-07 DIAGNOSIS — N261 Atrophy of kidney (terminal): Secondary | ICD-10-CM | POA: Diagnosis not present

## 2017-12-07 DIAGNOSIS — Z1211 Encounter for screening for malignant neoplasm of colon: Secondary | ICD-10-CM

## 2017-12-07 MED ORDER — POTASSIUM CHLORIDE 20 MEQ/15ML (10%) PO SOLN: 20.0000 meq | Freq: Every day | ORAL | 0 refills | Status: DC

## 2017-12-07 MED ORDER — ROPINIROLE HCL ER 2 MG PO TB24: 2.0000 mg | ORAL_TABLET | Freq: Every day | ORAL | 0 refills | Status: DC

## 2017-12-07 NOTE — Patient Instructions (Signed)
Atherosclerosis °Atherosclerosis is narrowing and hardening of the blood vessels (arteries). Arteries are tubes that carry blood that contains oxygen from the heart to all parts of the body. Arteries can become narrow or clogged with a buildup of fat, cholesterol, calcium, or other substances (plaque). Plaque decreases the amount of blood that can flow through the artery. °Atherosclerosis can affect any artery in the body, including: °· Heart arteries (coronary artery disease), which may cause heart attack. °· Brain arteries, which may cause stroke. °· Leg, arm, and pelvis arteries (peripheral artery disease), which may cause pain and numbness. °· Kidney arteries, which may cause kidney (renal) failure. ° °Treatment may slow the disease and prevent further damage to the heart, brain, peripheral arteries, and kidneys. °What are the causes? °Atherosclerosis develops when plaque forms in an artery. This damages the inside wall of the artery. Over time, the plaque grows and hardens. It may break through the artery wall. This causes a blood clot to form over the break, which narrows the artery more. The clot may also break loose and travel to other arteries, causing more damage. °What increases the risk? °This condition is more likely to develop in people who: °· Are middle age or older. °· Have a family history of atherosclerosis. °· Have high cholesterol. °· Have high blood fats (triglycerides). °· Have diabetes. °· Are overweight. °· Smoke tobacco. °· Do not exercise enough. °· Have a substance in the blood that indicates increased levels of inflammation in the body (C-reactive protein, or CRP). °· Have sleep apnea. °· Are stressed. °· Drink too much alcohol. ° °What are the signs or symptoms? °This condition may not cause any symptoms. If you do have symptoms, they are caused by damage to an area of your body that is not getting enough blood. The following symptoms are possible: °· Coronary artery disease may cause  chest pain and shortness of breath. °· Decreased blood supply to your brain may cause a stroke. Signs and symptoms of stroke may include sudden: °? Weakness on one side of the body. °? Confusion. °? Changes in vision. °? Inability to speak or understand speech. °? Loss of balance, coordination, or ability to walk. °? Severe headache. °? Loss of consciousness. °· Peripheral artery disease may cause pain and numbness, often in the legs and hips. °· Renal failure may cause fatigue, nausea, swelling, and itchy skin. ° °How is this diagnosed? °This condition is diagnosed based on your medical history and a physical exam. During the exam, your health care provider will check your pulses and listen for a "whooshing" sound over your arteries (bruit). You may have tests, such as: °· Blood tests to check your levels of cholesterol, triglycerides, and CRP. °· Electrocardiogram (ECG) to check for heart damage. °· Chest X-ray to see if your heart is enlarged, which is a sign of heart failure. °· Stress test to see how your heart reacts to exercise. °· Echocardiogram to get images of the inside of your heart. °· Ankle-Brachial index to compare blood pressure in your arms to blood pressure in your ankles. °· Ultrasound of your peripheral arteries to check blood flow. °· CT scan to check for damage to your heart or brain. °· X-rays of blood vessels after dye has been injected (angiogram) to check blood flow. ° °How is this treated? °Treatment starts with lifestyle changes, which may include: °· Changing your diet. °· Losing weight. °· Reducing stress. °· Exercising and being more physically active. °· Not smoking. ° °  You also may need medicine to: °· Lower triglycerides and cholesterol. °· Lower and control blood pressure. °· Prevent blood clots. °· Lower inflammation in your body. °· Lower and control your blood sugar. ° °Sometimes, surgery is needed to remove plaque, widen your artery, or create a new path for your blood  (bypass). Surgical treatment may include: °· Removing plaque from an artery (endarterectomy). °· Opening a narrowed heart artery (angioplasty). °· Heart or peripheral artery bypass graft surgery. ° °Follow these instructions at home: °Lifestyle ° °· Eat a heart-healthy diet. Talk to your health care provider or a diet specialist (dietitian) if you need help. A heart-healthy diet includes: °? Limiting unhealthy fats and increasing healthy fats. Some examples of healthy fats are olive oil and canola oil. °? Eating plant-based foods, such as fruits, vegetables, nuts, legumes, and whole grains. °· Follow an exercise program as told by your health care provider. °· Maintain a healthy weight. Lose weight if directed by your health care provider. °· Rest when you are tired. °· Learn to manage your stress. °· Do not use any tobacco products, such as cigarettes, chewing tobacco, and e-cigarettes. If you need help quitting, ask your health care provider. °· Limit alcohol intake to no more than 1 drink a day for nonpregnant women and 2 drinks a day for men. One drink equals 12 oz of beer, 5 oz of wine, or 1½ oz of hard liquor. °· Do not abuse drugs. °General instructions °· Take over-the-counter and prescription medicines only as told by your health care provider. °· Manage other health conditions as told by your health care provider. °· Keep all follow-up visits as told by your health care provider. This is important. °Contact a health care provider if: °· You have chest pain or discomfort. This includes squeezing chest pain that may feel like indigestion (angina). °· You have shortness of breath. °· You have an irregular heartbeat. °· You have unexplained fatigue. °· You have unexplained pain or numbness in an arm, leg, or hip. °· You have nausea, swelling of your hands or feet, and itchy skin. °Get help right away if: °· You have symptoms of a heart attack, such as: °? Chest pain. °? Shortness of breath. °? Pain in your  neck, jaw, arms, back, or stomach. °? Cold sweat. °? Nausea. °? Light-headedness. °· You have symptoms of a stroke, such as sudden: °? Weakness on one side of your body. °? Confusion. °? Changes in vision. °? Inability to speak or understand speech. °? Loss of balance, coordination, or ability to walk. °? Severe headache. °? Loss of consciousness. °These symptoms may represent a serious problem that is an emergency. Do not wait to see if the symptoms will go away. Get medical help right away. Call your local emergency services (911 in the [place] Do not drive yourself to the hospital. °This information is not intended to replace advice given to you by your health care provider. Make sure you discuss any questions you have with your health care provider. °Document Released: 12/31/2003 Document Revised: 03/17/2016 Document Reviewed: 08/31/2015 °[employer] Interactive Patient Education © 2018 [employer] ° °

## 2017-12-07 NOTE — Progress Notes (Signed)
Patient: Suzanne Avila Female    DOB: 1946-08-15   72 y.o.   MRN: [id number] Visit Date: 12/07/2017  Today's Provider: Margaretann Loveless, PA-C   Chief Complaint  Patient presents with  . Follow-up   Subjective:    HPI Patient here today C/O upper abdominal pain x's 2 weeks. Patient reports pain is present in the afternoons. Patient reports taking acid reflux medication mild improvement.   Patient C/O diarrhea only in the morning x's 2 months. Patient denies any blood in stools. Reports diarrhea looks like mud. No pain or tenesmus prior to BM. Patient does take chronic opioid medications for back pain and fibromyalgia (chronic pain syndrome). She also has never had a colonoscopy.   Patient reports having to use albuterol two times every day along with Advair due to shortness of breath and cough. Patient is still smoking daily. She has not had any recent spirometry testing.   Patient C/O increased fatigue and pain all over body. This is worsening per patient. She is scheduled to see pain clinic on 01/09/18. She reports worsening leg cramping and restless leg, needing to move them often. States it isn't all the time, but she will go through "spells" where she has them frequently for 6 months or longer, then can go without them x 6 months. Denies any rest pain or claudication type symptoms.      Allergies  Allergen Reactions  . Codeine Itching  . Cyclobenzaprine Itching  . Penicillins     itching     Current Outpatient Medications:  .  albuterol (PROVENTIL HFA;VENTOLIN HFA) 108 (90 Base) MCG/ACT inhaler, ALBUTEROL, 90MCG/ACT (Inhalation Aerosol Solution)  2 PUFFS Every Day, As Needed for 0 days  Quantity: 0.00;  Refills: 0   Ordered :09-Jan-2014  Denna Haggard, MA, [place] ;  Started 10-Aug-2009 Active Comments: Medication taken as needed. , Disp: , Rfl:  .  aspirin 325 MG tablet, Take 325 mg by mouth daily., Disp: , Rfl:  .  atorvastatin (LIPITOR) 40 MG tablet, Take 40  mg by mouth daily., Disp: , Rfl:  .  cholecalciferol (VITAMIN D) 1000 units tablet, Take 1 tablet (1,000 Units total) by mouth daily., Disp: 90 tablet, Rfl: 3 .  diltiazem (CARDIZEM) 120 MG tablet, Take 1 tablet (120 mg total) by mouth daily., Disp: 90 tablet, Rfl: 3 .  dorzolamide (TRUSOPT) 2 % ophthalmic solution, , Disp: , Rfl:  .  Fluticasone-Salmeterol (ADVAIR DISKUS) 250-50 MCG/DOSE AEPB, USE 1 INHALATION TWICE A DAY, Disp: 180 each, Rfl: 3 .  [START ON 12/31/2017] HYDROcodone-acetaminophen (NORCO) 7.5-325 MG tablet, Take 1 tablet by mouth every 6 (six) hours as needed for severe pain., Disp: 120 tablet, Rfl: 0 .  latanoprost (XALATAN) 0.005 % ophthalmic solution, 1 drop at bedtime., Disp: , Rfl:  .  meloxicam (MOBIC) 15 MG tablet, Take 1 tablet (15 mg total) by mouth daily., Disp: 90 tablet, Rfl: 0 .  pregabalin (LYRICA) 150 MG capsule, Take 1 capsule (150 mg total) by mouth every 8 (eight) hours., Disp: 270 capsule, Rfl: 0 .  spironolactone (ALDACTONE) 25 MG tablet, Take 25 mg by mouth daily., Disp: , Rfl:  .  vitamin B-12 (CYANOCOBALAMIN) 500 MCG tablet, Take 500 mcg by mouth daily., Disp: , Rfl:  .  HYDROcodone-acetaminophen (NORCO) 7.5-325 MG tablet, Take 1 tablet by mouth every 6 (six) hours as needed for severe pain., Disp: 120 tablet, Rfl: 0 .  HYDROcodone-acetaminophen (NORCO) 7.5-325 MG tablet, Take 1 tablet by mouth every  6 (six) hours as needed for severe pain., Disp: 120 tablet, Rfl: 0 .  ramipril (ALTACE) 10 MG capsule, Take 1 capsule by mouth daily., Disp: , Rfl:   Review of Systems  Constitutional: Positive for diaphoresis.  Respiratory: Positive for cough and shortness of breath.   Cardiovascular: Negative.   Gastrointestinal: Positive for abdominal pain and diarrhea.  Musculoskeletal: Positive for arthralgias, back pain, gait problem, joint swelling and myalgias (cramps in hands, legs and abdomen).  Neurological: Positive for numbness.    Social History   Tobacco Use    . Smoking status: Current Every Day Smoker    Packs/day: 1.00    Years: 50.00    Pack years: 50.00    Types: Cigarettes  . Smokeless tobacco: Never Used  Substance Use Topics  . Alcohol use: Yes    Alcohol/week: 0.0 oz   Objective:   BP (!) 160/90 (BP Location: Left Arm, Patient Position: Sitting, Cuff Size: Normal)   Pulse 72   Temp 98.2 F (36.8 C) (Oral)   Resp 16   Ht 4\' 11"  (1.499 m)   Wt 115 lb 12.8 oz (52.5 kg)   SpO2 98%   BMI 23.39 kg/m  Vitals:   12/07/17 1121  BP: (!) 160/90  Pulse: 72  Resp: 16  Temp: 98.2 F (36.8 C)  TempSrc: Oral  SpO2: 98%  Weight: 115 lb 12.8 oz (52.5 kg)  Height: 4\' 11"  (1.499 m)     Physical Exam  Constitutional: She appears well-developed and well-nourished. No distress.  Neck: Normal range of motion. Neck supple. No tracheal deviation present. No thyromegaly present.  Cardiovascular: Normal rate, regular rhythm and normal heart sounds. Exam reveals no gallop and no friction rub.  No murmur heard. Pulmonary/Chest: Effort normal and breath sounds normal. No respiratory distress. She has no wheezes. She has no rales.  Abdominal: Soft. Bowel sounds are normal. She exhibits no distension and no mass. There is no tenderness. There is no guarding.  Musculoskeletal: She exhibits no edema.  Lymphadenopathy:    She has no cervical adenopathy.  Neurological: Gait (antalgic with cane) abnormal.  Skin: She is not diaphoretic.  Vitals reviewed.       Assessment & Plan:     1. Chronic obstructive pulmonary disease, unspecified COPD type (HCC) Spirometry today shows moderate restriction with FVC 65% and FEV1 71%. Continue Advair BID, albuterol prn for SOB, discussed adding spiriva but patient declines at this time and may readdress at CPE in coming weeks. Discussed smoking cessation but patient not interested. Due to patient smoking history she is high risk for lung cancer as well. Lung cancer screening CT ordered as below. I will f/u  pending these results.  - Spirometry with Graph  2. CAFL (chronic airflow limitation) (HCC) See above medical treatment plan.  3. Renal artery atherosclerosis, unilateral (HCC) Incidentally noted from a CT report from 11/18/16. Patient was never informed of results from this CT per patient. BP has been higher despite being on 3 anti-hypertensives. Patient is a smoker as well. Does not wish to quit at this time. Will refer patient to vascular surgery for consideration of renal duplex to see if intervention to save right kidney from complete atrophy may be possible. She is also high risk for other atherosclerosis with noted aortic atherosclerosis due to smoking. Patient is on atorvastatin.  - Ambulatory referral to Vascular Surgery  4. Renal atrophy, right See above medical treatment plan. - Ambulatory referral to Vascular Surgery  5. Cigarette smoker  See above medical treatment plan for #1.  - Ambulatory referral to Vascular Surgery - CT CHEST LUNG CA SCREEN LOW DOSE W/O CM; Future  6. Restless legs syndrome (RLS) Will try ropinirole xl as below for RLS symptoms. No other medication management options are available except possible addition of a muscle relaxer prn for patient as she is chronic pain management and on lyrica for fibromyalgia. May need to address worsening leg pain with pain clinic at scheduled follow up.  - rOPINIRole (REQUIP XL) 2 MG 24 hr tablet; Take 1 tablet (2 mg total) by mouth at bedtime.  Dispense: 90 tablet; Refill: 0  7. Colon cancer screening Patient declines colonoscopy at this time. Will readdress at CPE.   8. Diarrhea, unspecified type Question possible constipation from chronic opioid use. Patient declines xray or other imaging at this time since we are getting a chest ct and she is going to be seeing vascular. Wants to await and will readdress colonoscopy after CPE.   9. Gastroesophageal reflux disease, esophagitis presence not specified Continue OTC PPI as  symptoms are improving slightly. No weight loss but may need to consider possible atherosclerosis in the mesentery as well.   10. Hypokalemia Stable. Diagnosis pulled for medication refill. Continue current medical treatment plan. - potassium chloride 20 MEQ/15ML (10%) SOLN; Take 15 mLs (20 mEq total) by mouth daily.  Dispense: 450 mL; Refill: 0       Margaretann Loveless, PA-C  [facility]

## 2017-12-08 ENCOUNTER — Telehealth: Payer: Self-pay | Admitting: *Deleted

## 2017-12-08 DIAGNOSIS — Z87891 Personal history of nicotine dependence: Secondary | ICD-10-CM

## 2017-12-08 DIAGNOSIS — Z122 Encounter for screening for malignant neoplasm of respiratory organs: Secondary | ICD-10-CM

## 2017-12-08 NOTE — Telephone Encounter (Signed)
Received referral for initial lung cancer screening scan. Contacted patient and obtained smoking history,(current, 50 pack year) as well as answering questions related to screening process. Patient denies signs of lung cancer such as weight loss or hemoptysis. Patient denies comorbidity that would prevent curative treatment if lung cancer were found. Patient is scheduled for shared decision making visit and CT scan on 01/04/18.

## 2017-12-12 ENCOUNTER — Telehealth: Payer: Self-pay | Admitting: Pain Medicine

## 2017-12-12 NOTE — Telephone Encounter (Signed)
Pharmacy called stating they cannot find the hard copies for patients scripts, they have record that they received but cannot find. One for Feb and March. Patient needs meds. Please call pharmacy to discuss options.  [phone]

## 2017-12-12 NOTE — Telephone Encounter (Signed)
Spoke with pharmacist, they did receive the prescriptions for Oxycodone for Feb. And Mar.  as it has been scanned, but they do not have the hard copy. It is required to fill the prescriptions. Spoke with Crystal, will speak with Dr. Laban EmperorNaveira aboaut this.

## 2017-12-12 NOTE — Telephone Encounter (Signed)
Spoke with Dr. Laban EmperorNaveira. Patient must come for appointment. Notified patient, agrees to come for appointment tomorrow.

## 2017-12-13 ENCOUNTER — Encounter: Payer: Self-pay | Admitting: Nurse Practitioner

## 2017-12-13 ENCOUNTER — Ambulatory Visit: Payer: Medicare Other | Attending: Nurse Practitioner | Admitting: Nurse Practitioner

## 2017-12-13 ENCOUNTER — Other Ambulatory Visit: Payer: Self-pay

## 2017-12-13 VITALS — BP 189/102 | HR 95 | Temp 98.3°F | Resp 16 | Ht 59.0 in | Wt 118.0 lb

## 2017-12-13 DIAGNOSIS — M79602 Pain in left arm: Secondary | ICD-10-CM | POA: Insufficient documentation

## 2017-12-13 DIAGNOSIS — E876 Hypokalemia: Secondary | ICD-10-CM | POA: Insufficient documentation

## 2017-12-13 DIAGNOSIS — H409 Unspecified glaucoma: Secondary | ICD-10-CM | POA: Insufficient documentation

## 2017-12-13 DIAGNOSIS — M1612 Unilateral primary osteoarthritis, left hip: Secondary | ICD-10-CM | POA: Insufficient documentation

## 2017-12-13 DIAGNOSIS — M79605 Pain in left leg: Secondary | ICD-10-CM | POA: Diagnosis not present

## 2017-12-13 DIAGNOSIS — M47816 Spondylosis without myelopathy or radiculopathy, lumbar region: Secondary | ICD-10-CM | POA: Diagnosis not present

## 2017-12-13 DIAGNOSIS — G894 Chronic pain syndrome: Secondary | ICD-10-CM | POA: Diagnosis not present

## 2017-12-13 DIAGNOSIS — M8580 Other specified disorders of bone density and structure, unspecified site: Secondary | ICD-10-CM | POA: Diagnosis not present

## 2017-12-13 DIAGNOSIS — I10 Essential (primary) hypertension: Secondary | ICD-10-CM | POA: Insufficient documentation

## 2017-12-13 DIAGNOSIS — E7800 Pure hypercholesterolemia, unspecified: Secondary | ICD-10-CM | POA: Insufficient documentation

## 2017-12-13 DIAGNOSIS — E559 Vitamin D deficiency, unspecified: Secondary | ICD-10-CM | POA: Insufficient documentation

## 2017-12-13 DIAGNOSIS — M5416 Radiculopathy, lumbar region: Secondary | ICD-10-CM

## 2017-12-13 DIAGNOSIS — M797 Fibromyalgia: Secondary | ICD-10-CM | POA: Diagnosis not present

## 2017-12-13 DIAGNOSIS — M79601 Pain in right arm: Secondary | ICD-10-CM | POA: Insufficient documentation

## 2017-12-13 DIAGNOSIS — M545 Low back pain: Secondary | ICD-10-CM | POA: Diagnosis present

## 2017-12-13 DIAGNOSIS — M79604 Pain in right leg: Secondary | ICD-10-CM | POA: Insufficient documentation

## 2017-12-13 DIAGNOSIS — J309 Allergic rhinitis, unspecified: Secondary | ICD-10-CM | POA: Diagnosis not present

## 2017-12-13 DIAGNOSIS — M48061 Spinal stenosis, lumbar region without neurogenic claudication: Secondary | ICD-10-CM | POA: Diagnosis not present

## 2017-12-13 DIAGNOSIS — Z7982 Long term (current) use of aspirin: Secondary | ICD-10-CM | POA: Insufficient documentation

## 2017-12-13 DIAGNOSIS — Z720 Tobacco use: Secondary | ICD-10-CM | POA: Insufficient documentation

## 2017-12-13 DIAGNOSIS — M25559 Pain in unspecified hip: Secondary | ICD-10-CM | POA: Diagnosis not present

## 2017-12-13 DIAGNOSIS — E538 Deficiency of other specified B group vitamins: Secondary | ICD-10-CM | POA: Diagnosis not present

## 2017-12-13 DIAGNOSIS — K219 Gastro-esophageal reflux disease without esophagitis: Secondary | ICD-10-CM | POA: Diagnosis not present

## 2017-12-13 DIAGNOSIS — F1120 Opioid dependence, uncomplicated: Secondary | ICD-10-CM | POA: Insufficient documentation

## 2017-12-13 DIAGNOSIS — Z981 Arthrodesis status: Secondary | ICD-10-CM | POA: Insufficient documentation

## 2017-12-13 DIAGNOSIS — Z79899 Other long term (current) drug therapy: Secondary | ICD-10-CM | POA: Insufficient documentation

## 2017-12-13 DIAGNOSIS — B351 Tinea unguium: Secondary | ICD-10-CM | POA: Insufficient documentation

## 2017-12-13 DIAGNOSIS — Z79891 Long term (current) use of opiate analgesic: Secondary | ICD-10-CM | POA: Diagnosis not present

## 2017-12-13 DIAGNOSIS — I479 Paroxysmal tachycardia, unspecified: Secondary | ICD-10-CM | POA: Diagnosis not present

## 2017-12-13 DIAGNOSIS — M5414 Radiculopathy, thoracic region: Secondary | ICD-10-CM | POA: Insufficient documentation

## 2017-12-13 DIAGNOSIS — M961 Postlaminectomy syndrome, not elsewhere classified: Secondary | ICD-10-CM | POA: Diagnosis not present

## 2017-12-13 DIAGNOSIS — J449 Chronic obstructive pulmonary disease, unspecified: Secondary | ICD-10-CM | POA: Diagnosis not present

## 2017-12-13 DIAGNOSIS — M419 Scoliosis, unspecified: Secondary | ICD-10-CM | POA: Insufficient documentation

## 2017-12-13 DIAGNOSIS — G8929 Other chronic pain: Secondary | ICD-10-CM | POA: Diagnosis not present

## 2017-12-13 DIAGNOSIS — M25552 Pain in left hip: Secondary | ICD-10-CM | POA: Insufficient documentation

## 2017-12-13 DIAGNOSIS — M25551 Pain in right hip: Secondary | ICD-10-CM | POA: Diagnosis not present

## 2017-12-13 MED ORDER — HYDROCODONE-ACETAMINOPHEN 7.5-325 MG PO TABS: 1.0000 | ORAL_TABLET | Freq: Four times a day (QID) | ORAL | 0 refills | Status: DC | PRN

## 2017-12-13 NOTE — Patient Instructions (Addendum)
You have been given 2 prescriptions for Hydrocodone/APAP to last until 02/11/18. ____________________________________________________________________________________________  Medication Rules  Applies to: All patients receiving prescriptions (written or electronic).  Pharmacy of record: Pharmacy where electronic prescriptions will be sent. If written prescriptions are taken to a different pharmacy, please inform the nursing staff. The pharmacy listed in the electronic medical record should be the one where you would like electronic prescriptions to be sent.  Prescription refills: Only during scheduled appointments. Applies to both, written and electronic prescriptions.  NOTE: The following applies primarily to controlled substances (Opioid* Pain Medications).   Patient's responsibilities: 1. Pain Pills: Bring all pain pills to every appointment (except for procedure appointments). 2. Pill Bottles: Bring pills in original pharmacy bottle. Always bring newest bottle. Bring bottle, even if empty. 3. Medication refills: You are responsible for knowing and keeping track of what medications you need refilled. The day before your appointment, write a list of all prescriptions that need to be refilled. Bring that list to your appointment and give it to the admitting nurse. Prescriptions will be written only during appointments. If you forget a medication, it will not be "Called in", "Faxed", or "electronically sent". You will need to get another appointment to get these prescribed. 4. Prescription Accuracy: You are responsible for carefully inspecting your prescriptions before leaving our office. Have the discharge nurse carefully go over each prescription with you, before taking them home. Make sure that your name is accurately spelled, that your address is correct. Check the name and dose of your medication to make sure it is accurate. Check the number of pills, and the written instructions to make sure  they are clear and accurate. Make sure that you are given enough medication to last until your next medication refill appointment. 5. Taking Medication: Take medication as prescribed. Never take more pills than instructed. Never take medication more frequently than prescribed. Taking less pills or less frequently is permitted and encouraged, when it comes to controlled substances (written prescriptions).  6. Inform other Doctors: Always inform, all of your healthcare providers, of all the medications you take. 7. Pain Medication from other Providers: You are not allowed to accept any additional pain medication from any other Doctor or Healthcare provider. There are two exceptions to this rule. (see below) In the event that you require additional pain medication, you are responsible for notifying us, as stated below. 8. Medication Agreement: You are responsible for carefully reading and following our Medication Agreement. This must be signed before receiving any prescriptions from our practice. Safely store a copy of your signed Agreement. Violations to the Agreement will result in no further prescriptions. (Additional copies of our Medication Agreement are available upon request.) 9. Laws, Rules, & Regulations: All patients are expected to follow all [address] and [employer], Rules, [employer]. Ignorance of the Laws does not constitute a valid excuse. The use of any illegal substances is prohibited. 10. Adopted CDC guidelines & recommendations: Target dosing levels will be at or below 60 MME/day. Use of benzodiazepines** is not recommended.  Exceptions: There are only two exceptions to the rule of not receiving pain medications from other Healthcare Providers. 1. Exception #1 (Emergencies): In the event of an emergency (i.e.: accident requiring emergency care), you are allowed to receive additional pain medication. However, you are responsible for: As soon as you are able, call our office [phone]  [phone], at any time of the day or night, and leave a message stating your name, the date and  nature of the emergency, and the name and dose of the medication prescribed. In the event that your call is answered by a member of our staff, make sure to document and save the date, time, and the name of the person that took your information.  2. Exception #2 (Planned Surgery): In the event that you are scheduled by another doctor or dentist to have any type of surgery or procedure, you are allowed (for a period no longer than 30 days), to receive additional pain medication, for the acute post-op pain. However, in this case, you are responsible for picking up a copy of our "Post-op Pain Management for Surgeons" handout, and giving it to your surgeon or dentist. This document is available at our office, and does not require an appointment to obtain it. Simply go to our office during business hours (Monday-Thursday from 8:00 AM to 4:00 PM) (Friday 8:00 AM to 12:00 Noon) or if you have a scheduled appointment with us, prior to your surgery, and ask for it by name. In addition, you will need to provide us with your name, name of your surgeon, type of surgery, and date of procedure or surgery.  *Opioid medications include: morphine, codeine, oxycodone, oxymorphone, hydrocodone, hydromorphone, meperidine, tramadol, tapentadol, buprenorphine, fentanyl, methadone. **Benzodiazepine medications include: diazepam (Valium), alprazolam (Xanax), clonazepam (Klonopine), lorazepam (Ativan), clorazepate (Tranxene), chlordiazepoxide (Librium), estazolam (Prosom), oxazepam (Serax), temazepam (Restoril), triazolam (Halcion)  ____________________________________________________________________________________________

## 2017-12-13 NOTE — Progress Notes (Signed)
Nursing Pain Medication Assessment:  Safety precautions to be maintained throughout the outpatient stay will include: orient to surroundings, keep bed in low position, maintain call bell within reach at all times, provide assistance with transfer out of bed and ambulation.  Medication Inspection Compliance: Suzanne Avila did not comply with our request to bring her pills to be counted. She was reminded that bringing the medication bottles, even when empty, is a requirement.  Medication: None brought in. Pill/Patch Count: None available to be counted. Bottle Appearance: No container available. Did not bring bottle(s) to appointment. Filled Date: N/A Last Medication intake:  Yesterday

## 2017-12-13 NOTE — Progress Notes (Signed)
Patient's Name: Suzanne Avila  MRN: [id number]  Referring Provider: Florian Buff*  DOB: Feb 25, 1946  PCP: Mar Daring, PA-C  DOS: 12/13/2017  Note by: Vevelyn Francois NP  Service setting: Ambulatory outpatient  Specialty: Interventional Pain Management  Location: [facility] (AMB) Pain Management Facility    Patient type: Established    Primary Reason(s) for Visit: Encounter for prescription drug management. (Level of risk: moderate)  CC: Back Pain (lower, left)  HPI  Suzanne Avila is a 72 y.o. year old, female patient, who comes today for a medication management evaluation. 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[place]); B12 deficiency; CAFL (chronic airflow limitation) ([place]); Narrowing of intervertebral disc space; Encounter for screening for diabetes mellitus; Acid reflux; Glaucoma; History of metabolic disorder; Benign essential HTN; Fungal infection of toenail; Osteopenia; Hypercholesterolemia without hypertriglyceridemia; Compulsive tobacco user syndrome; Absence of bladder continence; Osteoarthritis of hip (Left); Chronic hip pain (Left); Chronic lumbar radicular pain (L5 dermatome) (Location of Primary Source of Pain) (Bilateral) (L>R); Lumbar facet syndrome (Location of Primary Source of Pain) (Bilateral) (L>R); Lumbar spondylosis; 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 Her primarily concern today is the Back Pain (lower, left)  Pain Assessment: Location: Left, Lower Back Radiating: denies Onset: More than a month ago Duration: Chronic pain Quality: Constant, Burning(itching) Severity: 4 /10 (self-reported pain score)  Note: Reported level is compatible with observation.                          Timing: Constant Modifying factors: medications  Suzanne Avila was last scheduled for an appointment on 11/09/2017 for medication management. During today's appointment we reviewed Suzanne Avila's chronic pain status, as well as her outpatient medication regimen. She is in today secondary to pharmacy problems with recent prescriptions. She supplied [employer] with her hard copies of her medication in Jan. One of her prescriptions was refilled. [facility] closed and is now [employer]. The pharmacist called on yesterday on behalf of the patient and informed us that they could not fill her prescription secondary to now not having the hard copy. The PMP was pulled and the way it looks as if [facility] had taken over [facility] in 03/18 because all prescriptions were under [facility]   The patient  reports that she does not use drugs. Her body mass index is 23.83 kg/m.  Further details on both, my assessment(s), as well as the proposed treatment plan, please see below.  Controlled Substance Pharmacotherapy Assessment REMS (Risk Evaluation and Mitigation Strategy)  Analgesic:Hydrocodone/APAP 7.5/325 one every 6 hours (30 mg per day) MME/day:30 mg/day.     Rise Patience  12/13/2017  9:52 AM  Sign at close encounter Nursing Pain Medication Assessment:  Safety precautions to be maintained throughout the outpatient stay will include: orient to surroundings, keep bed in low position,  maintain call  bell within reach at all times, provide assistance with transfer out of bed and ambulation.  Medication Inspection Compliance: [name] did not comply with our request to bring her pills to be counted. She was reminded that bringing the medication bottles, even when empty, is a requirement.  Medication: None brought in. Pill/Patch Count: None available to be counted. Bottle Appearance: No container available. Did not bring bottle(s) to appointment. Filled Date: N/A Last Medication intake:  Yesterday   Pharmacokinetics: Liberation and absorption (onset of action): WNL Distribution (time to peak effect): WNL Metabolism and excretion (duration of action): WNL         Pharmacodynamics: Desired effects: Analgesia: Suzanne Avila reports >50% benefit. Functional ability: Patient reports that medication allows her to accomplish basic ADLs Clinically meaningful improvement in function (CMIF): Sustained CMIF goals met Perceived effectiveness: Described as relatively effective, allowing for increase in activities of daily living (ADL) Undesirable effects: Side-effects or Adverse reactions: None reported Monitoring: [place] PMP: Online review of the past 65-monthperiod conducted. Compliant with practice rules and regulations Last UDS on record: Summary  Date Value Ref Range Status  11/01/2017 FINAL  Final    Comment:    ==================================================================== TOXASSURE SELECT 13 (MW) ==================================================================== Test                             Result       Flag       Units Drug Present and Declared for Prescription Verification   Hydrocodone                    3117         EXPECTED   ng/mg creat   Hydromorphone                  1063         EXPECTED   ng/mg creat   Norhydrocodone                 1775         EXPECTED   ng/mg creat    Sources of hydrocodone include scheduled prescription    medications.  Hydromorphone and norhydrocodone are expected    metabolites of hydrocodone. Hydromorphone is also available as a    scheduled prescription medication. ==================================================================== Test                      Result    Flag   Units      Ref Range   Creatinine              24               mg/dL      >=20 ==================================================================== Declared Medications:  The flagging and interpretation on this report are based on the  following declared medications.  Unexpected results may arise from  inaccuracies in the declared medications.  **Note: The testing scope of this panel includes these medications:  Hydrocodone (Norco)  **Note: The testing scope of this panel does not include following  reported medications:  Acetaminophen (Norco)  Albuterol  Aspirin  Atorvastatin  Cholecalciferol  Cyanocobalamin  Diltiazem  Dorzolamide  Fluticasone  Latanoprost (Xalatan)  Meloxicam (Mobic)  Potassium (Klor-Con)  Pregabalin (Lyrica)  Salmeterol ==================================================================== For clinical consultation, please call [phone] ====================================================================    UDS interpretation: Compliant          Medication Assessment Form: Reviewed. Patient indicates  being compliant with therapy Treatment compliance: Compliant Risk Assessment Profile: Aberrant behavior: See prior evaluations. None observed or detected today Comorbid factors increasing risk of overdose: See prior notes. No additional risks detected today Risk of substance use disorder (SUD): Low Opioid Risk Tool - 12/13/17 0944      Family History of Substance Abuse   Alcohol  Positive Female    Illegal Drugs  Negative    Rx Drugs  Negative      Personal History of Substance Abuse   Alcohol  Negative    Illegal Drugs  Negative    Rx Drugs  Negative      Age   Age between 35-45  years   No      History of Preadolescent Sexual Abuse   History of Preadolescent Sexual Abuse  Negative or Female      Psychological Disease   Psychological Disease  Negative    Depression  Negative      Total Score   Opioid Risk Tool Scoring  1    Opioid Risk Interpretation  Low Risk      ORT Scoring interpretation table:  Score <3 = Low Risk for SUD  Score between 4-7 = Moderate Risk for SUD  Score >8 = High Risk for Opioid Abuse   Risk Mitigation Strategies:  Patient Counseling: Covered Patient-Prescriber Agreement (PPA): Present and active  Notification to other healthcare providers: Done  Pharmacologic Plan: No change in therapy, at this time.             Laboratory Chemistry  Inflammation Markers (CRP: Acute Phase) (ESR: Chronic Phase) Lab Results  Component Value Date   CRP <0.8 02/14/2017   ESRSEDRATE 20 02/14/2017                         Rheumatology Markers Lab Results  Component Value Date   RF <10.0 06/14/2016   ANA Negative 06/14/2016                Renal Function Markers Lab Results  Component Value Date   BUN 9 06/14/2016   CREATININE 0.71 06/14/2016   GFRAA 99 06/14/2016   GFRNONAA 86 06/14/2016                 Hepatic Function Markers Lab Results  Component Value Date   AST 24 06/14/2016   ALT 15 06/14/2016   ALBUMIN 4.4 06/14/2016   ALKPHOS 138 (H) 06/14/2016                 Electrolytes Lab Results  Component Value Date   NA 139 06/14/2016   K 4.3 06/14/2016   CL 97 06/14/2016   CALCIUM 9.6 06/14/2016   MG 1.7 02/14/2017                        Neuropathy Markers Lab Results  Component Value Date   VITAMINB12 1,220 (H) 06/14/2016                 Bone Pathology Markers Lab Results  Component Value Date   VD25OH 32.7 06/14/2016                         Coagulation Parameters Lab Results  Component Value Date   PLT 267 06/14/2016                 Cardiovascular Markers Lab Results  Component Value Date  BNP 78.2  06/14/2016   CKTOTAL 120 06/14/2016   HGB 14.8 06/14/2016   HCT 43.0 06/14/2016                 CA Markers No results found for: CEA, CA125, LABCA2               Note: Lab results reviewed.  Recent Diagnostic Imaging Results  DG Thoracic Spine 2 View CLINICAL DATA:  Back pain for several years, no known injury, initial encounter  EXAM: THORACIC SPINE 2 VIEWS  COMPARISON:  02/03/2010  FINDINGS: Vertebral body height is well maintained. The pedicles are within normal limits. No paraspinal mass lesion is noted. A mild scoliosis concave to the right is noted centered about the T10 vertebral body.  IMPRESSION: Mild scoliosis.  Electronically Signed   By: Inez Catalina M.D.   On: 02/14/2017 13:58 DG Lumbar Spine Complete W/Bend CLINICAL DATA:  Low back pain.  Prior surgery.  No known injury.  EXAM: LUMBAR SPINE - COMPLETE WITH BENDING VIEWS  COMPARISON:  CT 11/18/2016.  FINDINGS: Lumbar vertebra numbered as per prior exam. L4 through S1 posterior and interbody fusion with anatomic alignment. Hardware intact. Diffuse severe osteopenia and degenerative change. Lumbar scoliosis concave left. No acute abnormality identified. Aortoiliac atherosclerotic vascular disease.  IMPRESSION: 1. L4 through S1 posterior and interbody fusion with anatomic alignment. Hardware intact long  2. Diffuse severe osteopenia degenerative change. Lumbar spine scoliosis concave left. No acute bony abnormality identified.  3. Aortoiliac atherosclerotic vascular disease.  Electronically Signed   By: Marcello Moores  Register   On: 02/14/2017 13:55  Complexity Note: Imaging results reviewed. Results shared with Ms. Rogue Bussing, using Layman's terms.                         Meds   Current Outpatient Medications:  .  albuterol (PROVENTIL HFA;VENTOLIN HFA) 108 (90 Base) MCG/ACT inhaler, ALBUTEROL, 90MCG/ACT (Inhalation Aerosol Solution)  2 PUFFS Every Day, As Needed for 0 days  Quantity: 0.00;  Refills:  0   Ordered :09-Jan-2014  Celene Kras, MA, [place]ast[place] ;  Started 10-Aug-2009 Active Comments: Medication taken as needed. , Disp: , Rfl:  .  aspirin 325 MG tablet, Take 325 mg by mouth daily., Disp: , Rfl:  .  atorvastatin (LIPITOR) 40 MG tablet, Take 40 mg by mouth daily., Disp: , Rfl:  .  cholecalciferol (VITAMIN D) 1000 units tablet, Take 1 tablet (1,000 Units total) by mouth daily., Disp: 90 tablet, Rfl: 3 .  diltiazem (CARDIZEM) 120 MG tablet, Take 1 tablet (120 mg total) by mouth daily., Disp: 90 tablet, Rfl: 3 .  dorzolamide (TRUSOPT) 2 % ophthalmic solution, , Disp: , Rfl:  .  Fluticasone-Salmeterol (ADVAIR DISKUS) 250-50 MCG/DOSE AEPB, USE 1 INHALATION TWICE A DAY, Disp: 180 each, Rfl: 3 .  [START ON 01/12/2018] HYDROcodone-acetaminophen (NORCO) 7.5-325 MG tablet, Take 1 tablet by mouth every 6 (six) hours as needed for severe pain., Disp: 120 tablet, Rfl: 0 .  HYDROcodone-acetaminophen (NORCO) 7.5-325 MG tablet, Take 1 tablet by mouth every 6 (six) hours as needed for severe pain., Disp: 120 tablet, Rfl: 0 .  latanoprost (XALATAN) 0.005 % ophthalmic solution, 1 drop at bedtime., Disp: , Rfl:  .  meloxicam (MOBIC) 15 MG tablet, Take 1 tablet (15 mg total) by mouth daily., Disp: 90 tablet, Rfl: 0 .  potassium chloride 20 MEQ/15ML (10%) SOLN, Take 15 mLs (20 mEq total) by mouth daily., Disp: 450 mL, Rfl: 0 .  pregabalin (  LYRICA) 150 MG capsule, Take 1 capsule (150 mg total) by mouth every 8 (eight) hours., Disp: 270 capsule, Rfl: 0 .  ramipril (ALTACE) 10 MG capsule, Take 1 capsule by mouth daily., Disp: , Rfl:  .  rOPINIRole (REQUIP XL) 2 MG 24 hr tablet, Take 1 tablet (2 mg total) by mouth at bedtime., Disp: 90 tablet, Rfl: 0 .  spironolactone (ALDACTONE) 25 MG tablet, Take 25 mg by mouth daily., Disp: , Rfl:  .  vitamin B-12 (CYANOCOBALAMIN) 500 MCG tablet, Take 500 mcg by mouth daily., Disp: , Rfl:  .  HYDROcodone-acetaminophen (NORCO) 7.5-325 MG tablet, Take 1 tablet by mouth every 6  (six) hours as needed for severe pain., Disp: 120 tablet, Rfl: 0  ROS  Constitutional: Denies any fever or chills Gastrointestinal: No reported hemesis, hematochezia, vomiting, or acute GI distress Musculoskeletal: Denies any acute onset joint swelling, redness, loss of ROM, or weakness Neurological: No reported episodes of acute onset apraxia, aphasia, dysarthria, agnosia, amnesia, paralysis, loss of coordination, or loss of consciousness  Allergies  Ms. Goon is allergic to codeine; cyclobenzaprine; and penicillins.  [place]  Drug: Ms. Bitting  reports that she does not use drugs. Alcohol:  reports that she drinks alcohol. Tobacco:  reports that she has been smoking cigarettes.  She has a 50.00 pack-year smoking history. she has never used smokeless tobacco. Medical:  has a past medical history of Glaucoma and Hypertension. Surgical: Ms. Catlin  has a past surgical history that includes Back surgery; Eye surgery; and Carpal tunnel release (Left). Family: family history includes Cancer in her father and mother.  Constitutional Exam  General appearance: Well nourished, well developed, and well hydrated. In no apparent acute distress Vitals:   12/13/17 0935  BP: (!) 189/102  Pulse: 95  Resp: 16  Temp: 98.3 F (36.8 C)  TempSrc: Oral  SpO2: 97%  Weight: 118 lb (53.5 kg)  Height: 4' 11" (1.499 m)  Psych/Mental status: Alert, oriented x 3 (person, place, & time)       Eyes: PERLA Respiratory: No evidence of acute respiratory distress   Assessment  Primary Diagnosis & Pertinent Problem List: The primary encounter diagnosis was Lumbar spondylosis. Diagnoses of Chronic lumbar radicular pain (L5 dermatome) (Location of Primary Source of Pain) (Bilateral) (L>R), Chronic hip pain, unspecified laterality, Fibromyalgia, and Chronic pain syndrome were also pertinent to this visit.  Status Diagnosis  Controlled Controlled Controlled 1. Lumbar spondylosis   2. Chronic lumbar  radicular pain (L5 dermatome) (Location of Primary Source of Pain) (Bilateral) (L>R)   3. Chronic hip pain, unspecified laterality   4. Fibromyalgia   5. Chronic pain syndrome     Problems updated and reviewed during this visit: No problems updated. Plan of Care  Pharmacotherapy (Medications Ordered): Meds ordered this encounter  Medications  . HYDROcodone-acetaminophen (NORCO) 7.5-325 MG tablet    Sig: Take 1 tablet by mouth every 6 (six) hours as needed for severe pain.    Dispense:  120 tablet    Refill:  0    Do not place this medication, or any other prescription from our practice, on "Automatic Refill". Patient may have prescription filled one day early if pharmacy is closed on scheduled refill date. Do not fill until: 01/12/2018 To last until: 02/11/2018    Order Specific Question:   Supervising Provider    Answer:   Milinda Pointer [phone]  . HYDROcodone-acetaminophen (NORCO) 7.5-325 MG tablet    Sig: Take 1 tablet by mouth every 6 (six) hours as  needed for severe pain.    Dispense:  120 tablet    Refill:  0    Do not place this medication, or any other prescription from our practice, on "Automatic Refill". Patient may have prescription filled one day early if pharmacy is closed on scheduled refill date. Do not fill until: 12/13/2017 To last until: 01/12/2018    Order Specific Question:   Supervising Provider    Answer:   Milinda Pointer [phone]   New Prescriptions   No medications on file   Medications administered today: Meriem Lemieux. [place] had no medications administered during this visit. Lab-work, procedure(s), and/or referral(s): No orders of the defined types were placed in this encounter.  Imaging and/or referral(s): None  Provider-requested follow-up: Return in about 7 weeks (around 01/31/2018).  Future Appointments  Date Time Provider [place]  01/01/2018  8:40 AM [facility]-NURSE HEALTH ADVISOR [facility] None  01/01/2018 10:00 AM Mar Daring,  PA-C [facility] None  01/04/2018  1:35 PM [facility]-CT [facility]-CT [facility]  01/04/2018  1:45 PM Verlon Au, NP [facility] None  01/31/2018  9:15 AM Vevelyn Francois, NP [facility] None   Primary Care Physician: Mar Daring, PA-C Location: [facility] Note by: Vevelyn Francois NP Date: 12/13/2017; Time: 1:39 PM  Pain Score Disclaimer: We use the NRS-11 scale. This is a self-reported, subjective measurement of pain severity with only modest accuracy. It is used primarily to identify changes within a particular patient. It must be understood that outpatient pain scales are significantly less accurate that those used for research, where they can be applied under ideal controlled circumstances with minimal exposure to variables. In reality, the score is likely to be a combination of pain intensity and pain affect, where pain affect describes the degree of emotional arousal or changes in action readiness caused by the sensory experience of pain. Factors such as social and work situation, setting, emotional state, anxiety levels, expectation, and prior pain experience may influence pain perception and show large inter-individual differences that may also be affected by time variables.  Patient instructions provided during this appointment: Patient Instructions  You have been given 2 prescriptions for Hydrocodone/APAP to last until 02/11/18. ____________________________________________________________________________________________  Medication Rules  Applies to: All patients receiving prescriptions (written or electronic).  Pharmacy of record: Pharmacy where electronic prescriptions will be sent. If written prescriptions are taken to a different pharmacy, please inform the nursing staff. The pharmacy listed in the electronic medical record should be the one where you would like electronic prescriptions to be sent.  Prescription refills: Only during scheduled  appointments. Applies to both, written and electronic prescriptions.  NOTE: The following applies primarily to controlled substances (Opioid* Pain Medications).   Patient's responsibilities: 1. Pain Pills: Bring all pain pills to every appointment (except for procedure appointments). 2. Pill Bottles: Bring pills in original pharmacy bottle. Always bring newest bottle. Bring bottle, even if empty. 3. Medication refills: You are responsible for knowing and keeping track of what medications you need refilled. The day before your appointment, write a list of all prescriptions that need to be refilled. Bring that list to your appointment and give it to the admitting nurse. Prescriptions will be written only during appointments. If you forget a medication, it will not be "Called in", "Faxed", or "electronically sent". You will need to get another appointment to get these prescribed. 4. Prescription Accuracy: You are responsible for carefully inspecting your prescriptions before leaving our office. Have the discharge nurse carefully go  over each prescription with you, before taking them home. Make sure that your name is accurately spelled, that your address is correct. Check the name and dose of your medication to make sure it is accurate. Check the number of pills, and the written instructions to make sure they are clear and accurate. Make sure that you are given enough medication to last until your next medication refill appointment. 5. Taking Medication: Take medication as prescribed. Never take more pills than instructed. Never take medication more frequently than prescribed. Taking less pills or less frequently is permitted and encouraged, when it comes to controlled substances (written prescriptions).  6. Inform other Doctors: Always inform, all of your healthcare providers, of all the medications you take. 7. Pain Medication from other Providers: You are not allowed to accept any additional pain  medication from any other Doctor or Healthcare provider. There are two exceptions to this rule. (see below) In the event that you require additional pain medication, you are responsible for notifying us, as stated below. 8. Medication Agreement: You are responsible for carefully reading and following our Medication Agreement. This must be signed before receiving any prescriptions from our practice. Safely store a copy of your signed Agreement. Violations to the Agreement will result in no further prescriptions. (Additional copies of our Medication Agreement are available upon request.) 9. Laws, Rules, & Regulations: All patients are expected to follow all Federal and [employer], [employer], Rules, [employer]. Ignorance of the Laws does not constitute a valid excuse. The use of any illegal substances is prohibited. 10. Adopted CDC guidelines & recommendations: Target dosing levels will be at or below 60 MME/day. Use of benzodiazepines** is not recommended.  Exceptions: There are only two exceptions to the rule of not receiving pain medications from other Healthcare Providers. 1. Exception #1 (Emergencies): In the event of an emergency (i.e.: accident requiring emergency care), you are allowed to receive additional pain medication. However, you are responsible for: As soon as you are able, call our office [phone], at any time of the day or night, and leave a message stating your name, the date and nature of the emergency, and the name and dose of the medication prescribed. In the event that your call is answered by a member of our staff, make sure to document and save the date, time, and the name of the person that took your information.  2. Exception #2 (Planned Surgery): In the event that you are scheduled by another doctor or dentist to have any type of surgery or procedure, you are allowed (for a period no longer than 30 days), to receive additional pain medication, for the acute post-op pain.  However, in this case, you are responsible for picking up a copy of our "Post-op Pain Management for Surgeons" handout, and giving it to your surgeon or dentist. This document is available at our office, and does not require an appointment to obtain it. Simply go to our office during business hours (Monday-Thursday from 8:00 AM to 4:00 PM) (Friday 8:00 AM to 12:00 Noon) or if you have a scheduled appointment with [place], prior to your surgery, and ask for it by name. In addition, you will need to provide [place] with your name, name of your surgeon, type of surgery, and date of procedure or surgery.  *Opioid medications include: morphine, codeine, oxycodone, oxymorphone, hydrocodone, hydromorphone, meperidine, tramadol, tapentadol, buprenorphine, fentanyl, methadone. **Benzodiazepine medications include: diazepam (Valium), alprazolam (Xanax), clonazepam (Klonopine), lorazepam (Ativan), clorazepate (Tranxene), chlordiazepoxide (Librium), estazolam (Prosom),  oxazepam (Serax), temazepam (Restoril), triazolam (Halcion)  ____________________________________________________________________________________________

## 2017-12-22 ENCOUNTER — Ambulatory Visit (INDEPENDENT_AMBULATORY_CARE_PROVIDER_SITE_OTHER): Payer: Medicare Other | Admitting: Vascular Surgery

## 2017-12-22 ENCOUNTER — Encounter (INDEPENDENT_AMBULATORY_CARE_PROVIDER_SITE_OTHER): Payer: Self-pay | Admitting: Vascular Surgery

## 2017-12-22 VITALS — BP 186/88 | HR 79 | Resp 17 | Ht 59.0 in | Wt 114.0 lb

## 2017-12-22 DIAGNOSIS — M5441 Lumbago with sciatica, right side: Secondary | ICD-10-CM

## 2017-12-22 DIAGNOSIS — I10 Essential (primary) hypertension: Secondary | ICD-10-CM | POA: Diagnosis not present

## 2017-12-22 DIAGNOSIS — N261 Atrophy of kidney (terminal): Secondary | ICD-10-CM | POA: Diagnosis not present

## 2017-12-22 DIAGNOSIS — M79605 Pain in left leg: Secondary | ICD-10-CM | POA: Diagnosis not present

## 2017-12-22 DIAGNOSIS — M79604 Pain in right leg: Secondary | ICD-10-CM | POA: Diagnosis not present

## 2017-12-22 DIAGNOSIS — I701 Atherosclerosis of renal artery: Secondary | ICD-10-CM | POA: Diagnosis not present

## 2017-12-22 DIAGNOSIS — G8929 Other chronic pain: Secondary | ICD-10-CM

## 2017-12-22 DIAGNOSIS — F17200 Nicotine dependence, unspecified, uncomplicated: Secondary | ICD-10-CM | POA: Diagnosis not present

## 2017-12-22 DIAGNOSIS — M79609 Pain in unspecified limb: Secondary | ICD-10-CM | POA: Insufficient documentation

## 2017-12-22 DIAGNOSIS — M5442 Lumbago with sciatica, left side: Secondary | ICD-10-CM

## 2017-12-22 DIAGNOSIS — E7800 Pure hypercholesterolemia, unspecified: Secondary | ICD-10-CM

## 2017-12-22 NOTE — Assessment & Plan Note (Signed)
The patient has lower extremity symptoms which are concerning for peripheral arterial disease.  This could certainly be neurogenic claudication, but this needs to be assessed for.  Pathophysiology and natural history of PAD were discussed.  ABIs at her convenience.  Continue aspirin and Lipitor.

## 2017-12-22 NOTE — Assessment & Plan Note (Signed)
I have independently reviewed her CT scan from over a year ago and she does clearly have a small right kidney.  This would be concerning for a right renal artery stenosis or occlusion.  I do not know if the kidney is salvageable.  Renal duplex will be performed to evaluate the kidney length as well as the flow to the right kidney to see if this would be salvageable.  I will see her back after this duplex.

## 2017-12-22 NOTE — Progress Notes (Signed)
Patient ID: Suzanne Avila, female   DOB: February 19, 1946, 72 y.o.   MRN: [id number]  Chief Complaint  Patient presents with  . New Patient (Initial Visit)    Discuss results from Renal CT    HPI Suzanne Avila is a 72 y.o. female.  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  The patient reports having a CT scan for spine disease over a year ago which was read out as having an atrophic right kidney.  I have independently reviewed this noncontrasted lumbar CT scan.  The evaluation of the kidney is obviously quite limited on this scan, but there is a markedly decreased size in the right kidney.  No flow characteristics can be made off of this noncontrasted scan.  The patient did not know of any significant renal dysfunction.  She does have hypertension on 2 medications. Also of note, the patient complains of significant discomfort in her legs with activity.  She states that this is generally relieved with rest.  She denies any ulceration or infection.  Both lower extremities are affected.  She has been told previously this is because of her back, but when she inquired about peripheral arterial disease she is concerned about this.  No previous vascular surgery or interventions to lower extremity to her knowledge.   Past Medical History:  Diagnosis Date  . Glaucoma   . Hypertension     Past Surgical History:  Procedure Laterality Date  . BACK SURGERY    . CARPAL TUNNEL RELEASE Left   . EYE SURGERY      Family History  Problem Relation Age of Onset  . Cancer Mother   . Cancer Father   No bleeding disorders or clotting disorders  Social History Social History   Tobacco Use  . Smoking status: Current Every Day Smoker    Packs/day: 1.00    Years: 50.00    Pack years: 50.00    Types: Cigarettes  . Smokeless tobacco: Never Used  Substance Use Topics  . Alcohol use: Yes    Alcohol/week: 0.0 oz  . Drug use: No    Allergies    Allergen Reactions  . Codeine Itching  . Cyclobenzaprine Itching  . Penicillins     itching    Current Outpatient Medications  Medication Sig Dispense Refill  . albuterol (PROVENTIL HFA;VENTOLIN HFA) 108 (90 Base) MCG/ACT inhaler ALBUTEROL, 90MCG/ACT (Inhalation Aerosol Solution)  2 PUFFS Every Day, As Needed for 0 days  Quantity: 0.00;  Refills: 0   Ordered :09-Jan-2014  Denna Haggard, MA, [place]ast[place] ;  Started 10-Aug-2009 Active Comments: Medication taken as needed.     Marland Kitchen aspirin 325 MG tablet Take 325 mg by mouth daily.    Marland Kitchen atorvastatin (LIPITOR) 40 MG tablet Take 40 mg by mouth daily.    . cholecalciferol (VITAMIN D) 1000 units tablet Take 1 tablet (1,000 Units total) by mouth daily. 90 tablet 3  . diltiazem (CARDIZEM) 120 MG tablet Take 1 tablet (120 mg total) by mouth daily. 90 tablet 3  . dorzolamide (TRUSOPT) 2 % ophthalmic solution     . Fluticasone-Salmeterol (ADVAIR DISKUS) 250-50 MCG/DOSE AEPB USE 1 INHALATION TWICE A DAY 180 each 3  . [START ON 01/12/2018] HYDROcodone-acetaminophen (NORCO) 7.5-325 MG tablet Take 1 tablet by mouth every 6 (six) hours as needed for severe pain. 120 tablet 0  . HYDROcodone-acetaminophen (NORCO) 7.5-325 MG tablet Take 1 tablet by mouth every 6 (six) hours as  needed for severe pain. 120 tablet 0  . latanoprost (XALATAN) 0.005 % ophthalmic solution 1 drop at bedtime.    . meloxicam (MOBIC) 15 MG tablet Take 1 tablet (15 mg total) by mouth daily. 90 tablet 0  . potassium chloride 20 MEQ/15ML (10%) SOLN Take 15 mLs (20 mEq total) by mouth daily. 450 mL 0  . pregabalin (LYRICA) 150 MG capsule Take 1 capsule (150 mg total) by mouth every 8 (eight) hours. 270 capsule 0  . ramipril (ALTACE) 10 MG capsule Take 1 capsule by mouth daily.    Marland Kitchen rOPINIRole (REQUIP XL) 2 MG 24 hr tablet Take 1 tablet (2 mg total) by mouth at bedtime. 90 tablet 0  . spironolactone (ALDACTONE) 25 MG tablet Take 25 mg by mouth daily.    . vitamin B-12 (CYANOCOBALAMIN) 500 MCG  tablet Take 500 mcg by mouth daily.    Marland Kitchen HYDROcodone-acetaminophen (NORCO) 7.5-325 MG tablet Take 1 tablet by mouth every 6 (six) hours as needed for severe pain. 120 tablet 0   No current facility-administered medications for this visit.       REVIEW OF SYSTEMS (Negative unless checked)  Constitutional: [] Weight loss  [] Fever  [] Chills Cardiac: [] Chest pain   [] Chest pressure   [] Palpitations   [] Shortness of breath when laying flat   [] Shortness of breath at rest   [] Shortness of breath with exertion. Vascular:  [x] Pain in legs with walking   [] Pain in legs at rest   [] Pain in legs when laying flat   [x] Claudication   [] Pain in feet when walking  [] Pain in feet at rest  [] Pain in feet when laying flat   [] History of DVT   [] Phlebitis   [] Swelling in legs   [] Varicose veins   [] Non-healing ulcers Pulmonary:   [] Uses home oxygen   [] Productive cough   [] Hemoptysis   [] Wheeze  [] COPD   [] Asthma Neurologic:  [] Dizziness  [] Blackouts   [] Seizures   [] History of stroke   [] History of TIA  [] Aphasia   [] Temporary blindness   [] Dysphagia   [] Weakness or numbness in arms   [] Weakness or numbness in legs Musculoskeletal:  [x] Arthritis   [] Joint swelling   [x] Joint pain   [] Low back pain Hematologic:  [] Easy bruising  [] Easy bleeding   [] Hypercoagulable state   [] Anemic  [] Hepatitis Gastrointestinal:  [] Blood in stool   [] Vomiting blood  [] Gastroesophageal reflux/heartburn   [] Abdominal pain Genitourinary:  [x] Chronic kidney disease   [] Difficult urination  [] Frequent urination  [] Burning with urination   [] Hematuria Skin:  [] Rashes   [] Ulcers   [] Wounds Psychological:  [] History of anxiety   []  History of major depression.    Physical Exam BP (!) 186/88 (BP Location: Right Arm, Patient Position: Sitting)   Pulse 79   Resp 17   Ht 4\' 11"  (1.499 m)   Wt 51.7 kg (114 lb)   BMI 23.03 kg/m  Gen:  WD/WN, NAD.  Strong tobacco odor Head: [place]/AT, No temporalis wasting. Ear/Nose/Throat: Hearing  grossly intact, nares w/o erythema or drainage, oropharynx w/o Erythema/Exudate Eyes: Conjunctiva clear, sclera non-icteric  Neck: trachea midline.  No bruit or JVD.  Pulmonary:  Good air movement, clear to auscultation bilaterally.  Cardiac: RRR, normal S1, S2 Vascular:  Vessel Right Left  Radial Palpable Palpable                          PT  1+ palpable Palpable  DP Palpable  trace palpable   Gastrointestinal:  soft, non-tender/non-distended.  Musculoskeletal: M/S 5/5 throughout.  Extremities without ischemic changes.  No deformity or atrophy.  No edema. Neurologic: Sensation grossly intact in extremities.  Symmetrical.  Speech is fluent. Motor exam as listed above. Psychiatric: Judgment intact, Mood & affect appropriate for pt's clinical situation. Dermatologic: No rashes or ulcers noted.  No cellulitis or open wounds.  Radiology No results found.  Labs Recent Results (from the past 2160 hour(s))  ToxASSURE Select 13 (MW), Urine     Status: None   Collection Time: 11/01/17 12:14 PM  Result Value Ref Range   Summary FINAL     Comment: ==================================================================== TOXASSURE SELECT 13 (MW) ==================================================================== Test                             Result       Flag       Units Drug Present and Declared for Prescription Verification   Hydrocodone                    3117         EXPECTED   ng/mg creat   Hydromorphone                  1063         EXPECTED   ng/mg creat   Norhydrocodone                 1775         EXPECTED   ng/mg creat    Sources of hydrocodone include scheduled prescription    medications. Hydromorphone and norhydrocodone are expected    metabolites of hydrocodone. Hydromorphone is also available as a    scheduled prescription medication. ==================================================================== Test                      Result    Flag   Units      Ref Range    Creatinine              24               mg/dL      >=16>=20 ==================================================================== Declared  Medications:  The flagging and interpretation on this report are based on the  following declared medications.  Unexpected results may arise from  inaccuracies in the declared medications.  **Note: The testing scope of this panel includes these medications:  Hydrocodone (Norco)  **Note: The testing scope of this panel does not include following  reported medications:  Acetaminophen (Norco)  Albuterol  Aspirin  Atorvastatin  Cholecalciferol  Cyanocobalamin  Diltiazem  Dorzolamide  Fluticasone  Latanoprost (Xalatan)  Meloxicam (Mobic)  Potassium (Klor-Con)  Pregabalin (Lyrica)  Salmeterol ==================================================================== For clinical consultation, please call [phone]. ====================================================================     Assessment/Plan:  Chronic low back pain (Location of Secondary source of pain) (Bilateral) (L>R) This could be causing her lower extremity symptoms.  Already getting treatment for this.  Does need to be assessed for PAD.  Hypercholesterolemia without hypertriglyceridemia lipid control important in reducing the progression of atherosclerotic disease. Continue statin therapy   Pain in limb The patient has lower extremity symptoms which are concerning for peripheral arterial disease.  This could certainly be neurogenic claudication, but this needs to be assessed for.  Pathophysiology and natural history of PAD were discussed.  ABIs at her convenience.  Continue aspirin and Lipitor.  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  Renal artery assessment planned.  Tobacco use disorder Certainly represents a major atherosclerotic risk factor.  Cessation would be of benefit.  Renal atrophy, right I have independently reviewed her CT scan  from over a year ago and she does clearly have a small right kidney.  This would be concerning for a right renal artery stenosis or occlusion.  I do not know if the kidney is salvageable.  Renal duplex will be performed to evaluate the kidney length as well as the flow to the right kidney to see if this would be salvageable.  I will see her back after this duplex.      Festus Barren 12/22/2017, 11:19 AM   This note was created with Dragon medical transcription system.  Any errors from dictation are unintentional.

## 2017-12-22 NOTE — Assessment & Plan Note (Addendum)
Certainly represents a major atherosclerotic risk factor.  Cessation would be of benefit.

## 2017-12-22 NOTE — Assessment & Plan Note (Signed)
lipid control important in reducing the progression of atherosclerotic disease. Continue statin therapy  

## 2017-12-22 NOTE — Assessment & Plan Note (Signed)
This could certainly be renovascular hypertension.  Renal artery assessment planned.

## 2017-12-22 NOTE — Assessment & Plan Note (Signed)
This could be causing her lower extremity symptoms.  Already getting treatment for this.  Does need to be assessed for PAD.

## 2017-12-22 NOTE — Patient Instructions (Signed)
Renal Artery Stenosis Renal artery stenosis (RAS) is narrowing of the artery that carries blood to your kidneys. It can affect one or both kidneys. Your kidneys filter waste and extra fluid from your blood. You get rid of the waste and fluid when you urinate. Your kidneys also make an important chemical messenger (hormone) called renin. Renin helps regulate your blood pressure. The first sign of RAS may be high blood pressure. Over time, other symptoms can develop. What are the causes? Plaque buildup in your arteries (atherosclerosis) is the main cause of RAS. The plaques that cause this are made up of:  Fat.  Cholesterol.  Calcium.  Other substances.  As these substances build up in your renal artery, this slows the blood supply to your kidneys. The lack of blood and oxygen causes the signs and symptoms of RAS. A much less common cause of RAS is a disease called fibromuscular dysplasia. This disease causes abnormal cell growth that narrows the renal artery. It is not related to atherosclerosis. It occurs mostly in women who are 25-50 years old. It may be passed down through families. What increases the risk? You may be at risk for renal artery stenosis if you:  Are a man who is at least 73 years old.  Are a woman who is at least 73 years old.  Have high blood pressure.  Have high cholesterol.  Are a smoker.  Abuse alcohol.  Have diabetes or prediabetes.  Are overweight.  Have a family history of early heart disease.  What are the signs or symptoms? RAS usually develops slowly. You may not have any signs or symptoms at first. The earliest signs may be:  Developing high blood pressure.  A sudden increase in existing high blood pressure.  No longer responding to medicine that used to control your blood pressure.  Later signs and symptoms are due to kidney damage. They may include:  Fatigue.  Shortness of breath.  Swollen legs and feet.  Dry  skin.  Headaches.  Muscle cramps.  Loss of appetite.  Nausea or vomiting.  How is this diagnosed? Your health care provider may suspect RAS based on changes in your blood pressure and your risk factors. A physical exam will be done. Your health care provider may use a stethoscope to listen for a whooshing sound (bruit) that can occur where the renal artery is blocking blood flow. Several tests may be done to confirm a diagnosis of RAS. These may include:  Blood and urine tests to check your kidney function.  Imaging tests of your kidneys, such as: ? A test that involves using sound waves to create an image of your kidneys and the blood flow to your kidneys (ultrasound). ? A test in which dye is injected into one of your blood vessels so images can be taken as the dye flows through your renal arteries (angiogram). These tests can be done using X-rays, a CT scan (computed tomography angiogram, CTA), or a type of MRI (magnetic resonance angiogram, MRA).  How is this treated? Making lifestyle changes to reduce your risk factors is the first treatment option for early RAS. If the blood flow to one of your kidneys is cut by more than half, you may need medicine to:  Lower your blood pressure. This is the main medical treatment for RAS. You may need more than one type of medicine for this. The two types that work best for RAS are: ? ACE inhibitors. ? Angiotensin receptor blockers.  Reduce fluid   in the body (diuretics).  Lower your cholesterol (statins).  If medicine is not enough to control RAS, you may need surgery. This may involve:  Threading a tube with an inflatable balloon into the renal artery to force it open (angioplasty).  Removing plaque from inside the artery (endarterectomy).  Follow these instructions at home:  Take medicines only as directed by your health care provider.  Make any lifestyle changes recommended by your health care provider. This may include: ? Working  with a dietitian to maintain a heart-healthy diet. This type of diet is low in saturated fat, salt, and added sugar. ? Starting an exercise program as directed by your health care provider. ? Maintaining a healthy weight. ? Quitting smoking. ? Not abusing alcohol.  Keep all follow-up visits as directed by your health care provider. This is important. Contact a health care provider if:  Your symptoms of RAS are not getting better.  Your symptoms are changing or getting worse. Get help right away if:  You have very bad pain in your back or abdomen.  You have blood in your urine. This information is not intended to replace advice given to you by your health care provider. Make sure you discuss any questions you have with your health care provider. Document Released: 07/06/2005 Document Revised: 03/17/2016 Document Reviewed: 01/23/2014 [employer] Interactive Patient Education  2018 [employer]  

## 2017-12-26 ENCOUNTER — Other Ambulatory Visit (INDEPENDENT_AMBULATORY_CARE_PROVIDER_SITE_OTHER): Payer: Self-pay | Admitting: Vascular Surgery

## 2017-12-26 DIAGNOSIS — N261 Atrophy of kidney (terminal): Secondary | ICD-10-CM

## 2017-12-26 DIAGNOSIS — I739 Peripheral vascular disease, unspecified: Secondary | ICD-10-CM

## 2017-12-27 ENCOUNTER — Encounter (INDEPENDENT_AMBULATORY_CARE_PROVIDER_SITE_OTHER): Payer: Self-pay | Admitting: Vascular Surgery

## 2017-12-27 ENCOUNTER — Ambulatory Visit (INDEPENDENT_AMBULATORY_CARE_PROVIDER_SITE_OTHER): Payer: Medicare Other

## 2017-12-27 ENCOUNTER — Ambulatory Visit (INDEPENDENT_AMBULATORY_CARE_PROVIDER_SITE_OTHER): Payer: Medicare Other | Admitting: Vascular Surgery

## 2017-12-27 VITALS — BP 188/91 | HR 72 | Resp 16 | Wt 114.0 lb

## 2017-12-27 DIAGNOSIS — F17200 Nicotine dependence, unspecified, uncomplicated: Secondary | ICD-10-CM

## 2017-12-27 DIAGNOSIS — N261 Atrophy of kidney (terminal): Secondary | ICD-10-CM

## 2017-12-27 DIAGNOSIS — I10 Essential (primary) hypertension: Secondary | ICD-10-CM

## 2017-12-27 DIAGNOSIS — I739 Peripheral vascular disease, unspecified: Secondary | ICD-10-CM

## 2017-12-27 DIAGNOSIS — M47816 Spondylosis without myelopathy or radiculopathy, lumbar region: Secondary | ICD-10-CM

## 2017-12-27 DIAGNOSIS — I701 Atherosclerosis of renal artery: Secondary | ICD-10-CM

## 2017-12-27 NOTE — Progress Notes (Signed)
Subjective:    Patient ID: Suzanne Avila, female    DOB: [birth year], 72 y.o.   MRN: [id number] Chief Complaint  Patient presents with  . 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  The patient presents today to review vascular studies.  The patient underwent a renal artery duplex which was notable for an abnormal size of the right kidney.  Abnormal cortical thickness of the right kidney.  Evidence of greater than 60% stenosis of the right renal artery.  Right renal vein flow is present.  Normal size of the left kidney.  1-60% stenosis of the left renal artery.  Left renal vein flow is present.  The patient states that she does not take her antihypertensive medication on a daily basis.  The patient notes that she has not taking anything of her medications in over a month becasue "she does not have any".  The patient said that the mail away pharmacy has not delivered her medications.  Patient's blood pressure today was 188/91.  The patient notes that her blood pressure is always 180s-190s systolic.  The patient underwent a bilateral ABI which was notable for no evidence of significant lower extremity arterial disease bilaterally.  The bilateral toe brachial indices were normal.  Bilateral triphasic waveforms noted to the tibial arteries.  Patient denies any fever, nausea vomiting.   Review of Systems  Constitutional: Negative.   HENT: Negative.   Eyes: Negative.   Respiratory: Negative.   Cardiovascular:       Renal artery stenosis  Gastrointestinal: Negative.   Endocrine: Negative.   Genitourinary: Negative.   Musculoskeletal: Negative.   Skin: Negative.   Allergic/Immunologic: Negative.   Neurological: Negative.   Hematological: Negative.   Psychiatric/Behavioral: Negative.       Objective:   Physical Exam  Constitutional: She is oriented to person, place, and time. She appears  well-developed and well-nourished. No distress.  HENT:  Head: Normocephalic and atraumatic.  Eyes: Conjunctivae are normal. Pupils are equal, round, and reactive to light.  Neck: Normal range of motion.  Cardiovascular: Normal rate, regular rhythm, normal heart sounds and intact distal pulses.  Pulses:      Radial pulses are 2+ on the right side, and 2+ on the left side.       Dorsalis pedis pulses are 2+ on the right side, and 2+ on the left side.       Posterior tibial pulses are 2+ on the right side, and 2+ on the left side.  Pulmonary/Chest: Effort normal and breath sounds normal.  Musculoskeletal: Normal range of motion. She exhibits no edema.  Neurological: She is alert and oriented to person, place, and time.  Skin: Skin is warm and dry. She is not diaphoretic.  Psychiatric: She has a normal mood and affect. Her behavior is normal. Judgment and thought content normal.  Vitals reviewed.  BP (!) 188/91 (BP Location: Right Arm)   Pulse 72   Resp 16   Wt 114 lb (51.7 kg)   BMI 23.03 kg/m   Past Medical History:  Diagnosis Date  . Glaucoma   . Hypertension    Social History   Socioeconomic History  . Marital status: Married    Spouse name: Not on file  . Number of children: Not on file  . Years of education: Not on file  . Highest education level: Not on file  Social Needs  . Financial resource strain: Not on file  . Food insecurity - worry: Not on file  . Food insecurity - inability: Not on file  . Transportation needs - medical: Not on file  . Transportation needs - non-medical: Not on file  Occupational History  . Not on file  Tobacco Use  . Smoking status: Current Every Day Smoker    Packs/day: 1.00    Years: 50.00    Pack years: 50.00    Types: Cigarettes  . Smokeless tobacco: Never Used  Substance and Sexual Activity  . Alcohol use: Yes    Alcohol/week: 0.0 oz  . Drug use: No  . Sexual activity: Not on file  Other Topics Concern  . Not on file    Social History Narrative  . Not on file   Past Surgical History:  Procedure Laterality Date  . BACK SURGERY    . CARPAL TUNNEL RELEASE Left   . EYE SURGERY     Family History  Problem Relation Age of Onset  . Cancer Mother   . Cancer Father    Allergies  Allergen Reactions  . Codeine Itching  . Cyclobenzaprine Itching  . 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  The patient presents today to review vascular studies.  The patient underwent a renal artery duplex which was notable for an abnormal size of the right kidney.  Abnormal cortical thickness of the right kidney.  Evidence of greater than 60% stenosis of the right renal artery.  Right renal vein flow is present.  Normal size of the left kidney.  1-60% stenosis of the left renal artery.  Left renal vein flow is present.  The patient states that she does not take her antihypertensive medication on a daily basis.  The patient notes that she has not taking anything of her medications in over a month becasue "she does not have any".  The patient said that the mail away pharmacy has not delivered her medications.  Patient's blood pressure today was 188/91.  The patient notes that her blood pressure is always 180s-190s systolic.  The patient underwent a bilateral ABI which was notable for no evidence of significant lower extremity arterial disease bilaterally.  The bilateral toe brachial indices were normal.  Bilateral triphasic waveforms noted to the tibial arteries.  Patient denies any fever, nausea vomiting.  1. Benign essential HTN - Stable Encouraged good control as its slows the progression of atherosclerotic disease  2. Lumbar spondylosis - Stable This is a major contributing factor to the patient's bilateral lower extremity claudication Patient with normal ABIs today.  3. Tobacco use disorder - Stable We had a  discussion for approximately 10 minutes regarding the absolute need for smoking cessation due to the deleterious nature of tobacco on the vascular system. We discussed the tobacco use would diminish patency of any intervention, and likely significantly worsen progressio of disease. We discussed multiple agents for quitting including replacement therapy or medications to reduce cravings such as Chantix. The patient voices their understanding of the importance of smoking cessation.  4. Renal artery stenosis (HCC) - New Patient with bilateral renal artery stenosis on duplex today Greater than 60% on the right 1-60% on the left Recommend a renal artery angiogram to assess the patient's anatomy and correct any stenosis at that time to restore blood flow to  the kidneys. This may also help with the patient's hypertension even though she is not being compliant with her medications At this time, the patient is refusing to move forward with an angiogram. I explained to the patient that chronic decreased blood flow to the kidneys can/will result in kidney disease/worsening control of her hypertension The patient expresses her understanding and continues to refuse moving forward with a renal angiogram Will bring the patient back in 6 months to repeat a renal artery duplex The patient was strongly encouraged to visit her primary care physician / call her pharmacy and find out where her medications are as not taking them will be detrimental to her health.  - VAS US RENAL ARTERY DUPLEX; Future  Current Outpatient Medications on File Prior to Visit  Medication Sig Dispense Refill  . albuterol (PROVENTIL HFA;VENTOLIN HFA) 108 (90 Base) MCG/ACT inhaler ALBUTEROL, 90MCG/ACT (Inhalation Aerosol Solution)  2 PUFFS Every Day, As Needed for 0 days  Quantity: 0.00;  Refills: 0   Ordered :09-Jan-2014  Denna HaggardAleksandrova, MA, Anastasiya ;  Started 10-Aug-2009 Active Comments: Medication taken as needed.     Marland Kitchen. aspirin 325 MG  tablet Take 325 mg by mouth daily.    Marland Kitchen. atorvastatin (LIPITOR) 40 MG tablet Take 40 mg by mouth daily.    . cholecalciferol (VITAMIN D) 1000 units tablet Take 1 tablet (1,000 Units total) by mouth daily. 90 tablet 3  . diltiazem (CARDIZEM) 120 MG tablet Take 1 tablet (120 mg total) by mouth daily. 90 tablet 3  . dorzolamide (TRUSOPT) 2 % ophthalmic solution     . Fluticasone-Salmeterol (ADVAIR DISKUS) 250-50 MCG/DOSE AEPB USE 1 INHALATION TWICE A DAY 180 each 3  . [START ON 01/12/2018] HYDROcodone-acetaminophen (NORCO) 7.5-325 MG tablet Take 1 tablet by mouth every 6 (six) hours as needed for severe pain. 120 tablet 0  . HYDROcodone-acetaminophen (NORCO) 7.5-325 MG tablet Take 1 tablet by mouth every 6 (six) hours as needed for severe pain. 120 tablet 0  . latanoprost (XALATAN) 0.005 % ophthalmic solution 1 drop at bedtime.    . meloxicam (MOBIC) 15 MG tablet Take 1 tablet (15 mg total) by mouth daily. 90 tablet 0  . potassium chloride 20 MEQ/15ML (10%) SOLN Take 15 mLs (20 mEq total) by mouth daily. 450 mL 0  . pregabalin (LYRICA) 150 MG capsule Take 1 capsule (150 mg total) by mouth every 8 (eight) hours. 270 capsule 0  . ramipril (ALTACE) 10 MG capsule Take 1 capsule by mouth daily.    Marland Kitchen. rOPINIRole (REQUIP XL) 2 MG 24 hr tablet Take 1 tablet (2 mg total) by mouth at bedtime. 90 tablet 0  . spironolactone (ALDACTONE) 25 MG tablet Take 25 mg by mouth daily.    . vitamin B-12 (CYANOCOBALAMIN) 500 MCG tablet Take 500 mcg by mouth daily.    Marland Kitchen. HYDROcodone-acetaminophen (NORCO) 7.5-325 MG tablet Take 1 tablet by mouth every 6 (six) hours as needed for severe pain. 120 tablet 0   No current facility-administered medications on file prior to visit.    There are no Patient Instructions on file for this visit. No Follow-up on file.  Minyon Billiter A Paula Busenbark, PA-C

## 2018-01-01 ENCOUNTER — Ambulatory Visit (INDEPENDENT_AMBULATORY_CARE_PROVIDER_SITE_OTHER): Payer: Medicare Other | Admitting: Physician Assistant

## 2018-01-01 ENCOUNTER — Encounter: Payer: Self-pay | Admitting: Physician Assistant

## 2018-01-01 ENCOUNTER — Ambulatory Visit (INDEPENDENT_AMBULATORY_CARE_PROVIDER_SITE_OTHER): Payer: Medicare Other

## 2018-01-01 VITALS — BP 164/84 | HR 72 | Temp 98.1°F | Ht 59.0 in

## 2018-01-01 DIAGNOSIS — E538 Deficiency of other specified B group vitamins: Secondary | ICD-10-CM | POA: Diagnosis not present

## 2018-01-01 DIAGNOSIS — F1020 Alcohol dependence, uncomplicated: Secondary | ICD-10-CM | POA: Diagnosis not present

## 2018-01-01 DIAGNOSIS — I701 Atherosclerosis of renal artery: Secondary | ICD-10-CM | POA: Diagnosis not present

## 2018-01-01 DIAGNOSIS — Z0000 Encounter for general adult medical examination without abnormal findings: Secondary | ICD-10-CM | POA: Diagnosis not present

## 2018-01-01 DIAGNOSIS — I471 Supraventricular tachycardia: Secondary | ICD-10-CM | POA: Diagnosis not present

## 2018-01-01 DIAGNOSIS — F1120 Opioid dependence, uncomplicated: Secondary | ICD-10-CM | POA: Diagnosis not present

## 2018-01-01 DIAGNOSIS — E7800 Pure hypercholesterolemia, unspecified: Secondary | ICD-10-CM | POA: Diagnosis not present

## 2018-01-01 DIAGNOSIS — I10 Essential (primary) hypertension: Secondary | ICD-10-CM | POA: Diagnosis not present

## 2018-01-01 DIAGNOSIS — E559 Vitamin D deficiency, unspecified: Secondary | ICD-10-CM

## 2018-01-01 DIAGNOSIS — J449 Chronic obstructive pulmonary disease, unspecified: Secondary | ICD-10-CM

## 2018-01-01 DIAGNOSIS — E785 Hyperlipidemia, unspecified: Secondary | ICD-10-CM | POA: Insufficient documentation

## 2018-01-01 MED ORDER — DILTIAZEM HCL 120 MG PO TABS: 120.0000 mg | ORAL_TABLET | Freq: Every day | ORAL | 3 refills | Status: DC

## 2018-01-01 MED ORDER — RAMIPRIL 10 MG PO CAPS
10.0000 mg | ORAL_CAPSULE | Freq: Every day | ORAL | 3 refills | Status: DC
Start: 2018-01-01 — End: 2018-04-03

## 2018-01-01 MED ORDER — SPIRONOLACTONE 25 MG PO TABS: 25.0000 mg | ORAL_TABLET | Freq: Every day | ORAL | 3 refills | Status: DC

## 2018-01-01 MED ORDER — ATORVASTATIN CALCIUM 40 MG PO TABS: 40.0000 mg | ORAL_TABLET | Freq: Every day | ORAL | 3 refills | Status: DC

## 2018-01-01 MED ORDER — FLUTICASONE-SALMETEROL 250-50 MCG/DOSE IN AEPB: INHALATION_SPRAY | RESPIRATORY_TRACT | 3 refills | Status: DC

## 2018-01-01 NOTE — Patient Instructions (Signed)
Health Maintenance for Postmenopausal Women Menopause is a normal process in which your reproductive ability comes to an end. This process happens gradually over a span of months to years, usually between the ages of 22 and 9. Menopause is complete when you have missed 12 consecutive menstrual periods. It is important to talk with your health care provider about some of the most common conditions that affect postmenopausal women, such as heart disease, cancer, and bone loss (osteoporosis). Adopting a healthy lifestyle and getting preventive care can help to promote your health and wellness. Those actions can also lower your chances of developing some of these common conditions. What should I know about menopause? During menopause, you may experience a number of symptoms, such as:  Moderate-to-severe hot flashes.  Night sweats.  Decrease in sex drive.  Mood swings.  Headaches.  Tiredness.  Irritability.  Memory problems.  Insomnia.  Choosing to treat or not to treat menopausal changes is an individual decision that you make with your health care provider. What should I know about hormone replacement therapy and supplements? Hormone therapy products are effective for treating symptoms that are associated with menopause, such as hot flashes and night sweats. Hormone replacement carries certain risks, especially as you become older. If you are thinking about using estrogen or estrogen with progestin treatments, discuss the benefits and risks with your health care provider. What should I know about heart disease and stroke? Heart disease, heart attack, and stroke become more likely as you age. This may be due, in part, to the hormonal changes that your body experiences during menopause. These can affect how your body processes dietary fats, triglycerides, and cholesterol. Heart attack and stroke are both medical emergencies. There are many things that you can do to help prevent heart disease  and stroke:  Have your blood pressure checked at least every 1-2 years. High blood pressure causes heart disease and increases the risk of stroke.  If you are 53-22 years old, ask your health care provider if you should take aspirin to prevent a heart attack or a stroke.  Do not use any tobacco products, including cigarettes, chewing tobacco, or electronic cigarettes. If you need help quitting, ask your health care provider.  It is important to eat a healthy diet and maintain a healthy weight. ? Be sure to include plenty of vegetables, fruits, low-fat dairy products, and lean protein. ? Avoid eating foods that are high in solid fats, added sugars, or salt (sodium).  Get regular exercise. This is one of the most important things that you can do for your health. ? Try to exercise for at least 150 minutes each week. The type of exercise that you do should increase your heart rate and make you sweat. This is known as moderate-intensity exercise. ? Try to do strengthening exercises at least twice each week. Do these in addition to the moderate-intensity exercise.  Know your numbers.Ask your health care provider to check your cholesterol and your blood glucose. Continue to have your blood tested as directed by your health care provider.  What should I know about cancer screening? There are several types of cancer. Take the following steps to reduce your risk and to catch any cancer development as early as possible. Breast Cancer  Practice breast self-awareness. ? This means understanding how your breasts normally appear and feel. ? It also means doing regular breast self-exams. Let your health care provider know about any changes, no matter how small.  If you are 40  or older, have a clinician do a breast exam (clinical breast exam or CBE) every year. Depending on your age, family history, and medical history, it may be recommended that you also have a yearly breast X-ray (mammogram).  If you  have a family history of breast cancer, talk with your health care provider about genetic screening.  If you are at high risk for breast cancer, talk with your health care provider about having an MRI and a mammogram every year.  Breast cancer (BRCA) gene test is recommended for women who have family members with BRCA-related cancers. Results of the assessment will determine the need for genetic counseling and BRCA1 and for BRCA2 testing. BRCA-related cancers include these types: ? Breast. This occurs in males or females. ? Ovarian. ? Tubal. This may also be called fallopian tube cancer. ? Cancer of the abdominal or pelvic lining (peritoneal cancer). ? Prostate. ? Pancreatic.  Cervical, Uterine, and Ovarian Cancer Your health care provider may recommend that you be screened regularly for cancer of the pelvic organs. These include your ovaries, uterus, and vagina. This screening involves a pelvic exam, which includes checking for microscopic changes to the surface of your cervix (Pap test).  For women ages 21-65, health care providers may recommend a pelvic exam and a Pap test every three years. For women ages 79-65, they may recommend the Pap test and pelvic exam, combined with testing for human papilloma virus (HPV), every five years. Some types of HPV increase your risk of cervical cancer. Testing for HPV may also be done on women of any age who have unclear Pap test results.  Other health care providers may not recommend any screening for nonpregnant women who are considered low risk for pelvic cancer and have no symptoms. Ask your health care provider if a screening pelvic exam is right for you.  If you have had past treatment for cervical cancer or a condition that could lead to cancer, you need Pap tests and screening for cancer for at least 20 years after your treatment. If Pap tests have been discontinued for you, your risk factors (such as having a new sexual partner) need to be  reassessed to determine if you should start having screenings again. Some women have medical problems that increase the chance of getting cervical cancer. In these cases, your health care provider may recommend that you have screening and Pap tests more often.  If you have a family history of uterine cancer or ovarian cancer, talk with your health care provider about genetic screening.  If you have vaginal bleeding after reaching menopause, tell your health care provider.  There are currently no reliable tests available to screen for ovarian cancer.  Lung Cancer Lung cancer screening is recommended for adults 69-62 years old who are at high risk for lung cancer because of a history of smoking. A yearly low-dose CT scan of the lungs is recommended if you:  Currently smoke.  Have a history of at least 30 pack-years of smoking and you currently smoke or have quit within the past 15 years. A pack-year is smoking an average of one pack of cigarettes per day for one year.  Yearly screening should:  Continue until it has been 15 years since you quit.  Stop if you develop a health problem that would prevent you from having lung cancer treatment.  Colorectal Cancer  This type of cancer can be detected and can often be prevented.  Routine colorectal cancer screening usually begins at  age 42 and continues through age 45.  If you have risk factors for colon cancer, your health care provider may recommend that you be screened at an earlier age.  If you have a family history of colorectal cancer, talk with your health care provider about genetic screening.  Your health care provider may also recommend using home test kits to check for hidden blood in your stool.  A small camera at the end of a tube can be used to examine your colon directly (sigmoidoscopy or colonoscopy). This is done to check for the earliest forms of colorectal cancer.  Direct examination of the colon should be repeated every  5-10 years until age 71. However, if early forms of precancerous polyps or small growths are found or if you have a family history or genetic risk for colorectal cancer, you may need to be screened more often.  Skin Cancer  Check your skin from head to toe regularly.  Monitor any moles. Be sure to tell your health care provider: ? About any new moles or changes in moles, especially if there is a change in a mole's shape or color. ? If you have a mole that is larger than the size of a pencil eraser.  If any of your family members has a history of skin cancer, especially at a young age, talk with your health care provider about genetic screening.  Always use sunscreen. Apply sunscreen liberally and repeatedly throughout the day.  Whenever you are outside, protect yourself by wearing long sleeves, pants, a wide-brimmed hat, and sunglasses.  What should I know about osteoporosis? Osteoporosis is a condition in which bone destruction happens more quickly than new bone creation. After menopause, you may be at an increased risk for osteoporosis. To help prevent osteoporosis or the bone fractures that can happen because of osteoporosis, the following is recommended:  If you are 46-71 years old, get at least 1,000 mg of calcium and at least 600 mg of vitamin D per day.  If you are older than age 55 but younger than age 65, get at least 1,200 mg of calcium and at least 600 mg of vitamin D per day.  If you are older than age 54, get at least 1,200 mg of calcium and at least 800 mg of vitamin D per day.  Smoking and excessive alcohol intake increase the risk of osteoporosis. Eat foods that are rich in calcium and vitamin D, and do weight-bearing exercises several times each week as directed by your health care provider. What should I know about how menopause affects my mental health? Depression may occur at any age, but it is more common as you become older. Common symptoms of depression  include:  Low or sad mood.  Changes in sleep patterns.  Changes in appetite or eating patterns.  Feeling an overall lack of motivation or enjoyment of activities that you previously enjoyed.  Frequent crying spells.  Talk with your health care provider if you think that you are experiencing depression. What should I know about immunizations? It is important that you get and maintain your immunizations. These include:  Tetanus, diphtheria, and pertussis (Tdap) booster vaccine.  Influenza every year before the flu season begins.  Pneumonia vaccine.  Shingles vaccine.  Your health care provider may also recommend other immunizations. This information is not intended to replace advice given to you by your health care provider. Make sure you discuss any questions you have with your health care provider. Document Released: 12/02/2005  Document Revised: 04/29/2016 Document Reviewed: 07/14/2015 [employer] Interactive Patient Education  2018 [employer]  

## 2018-01-01 NOTE — Progress Notes (Signed)
Patient: Suzanne Avila Female    DOB: Oct 01, 1946   72 y.o.   MRN: [id number] Visit Date: 01/01/2018  Today's Provider: Margaretann Loveless, PA-C   Chief Complaint  Patient presents with  . Follow-up    Hypertension,Hypercholesterolemia   Subjective:    HPI  Hypertension, follow-up:  BP Readings from Last 3 Encounters:  01/01/18 (!) 164/84  12/27/17 (!) 188/91  12/22/17 (!) 186/88    She was last seen for hypertension 1 years ago.  BP at that visit was 160/82 Management since that visit includes none.She reports excellent compliance with treatment. She is not having side effects.  She is not exercising. She is adherent to low salt diet.   Outside blood pressures are not checking. She is experiencing none.  Patient denies chest pain, chest pressure/discomfort, claudication, exertional chest pressure/discomfort, lower extremity edema and palpitations.   Cardiovascular risk factors include advanced age (older than 48 for men, 82 for women), dyslipidemia, hypertension, sedentary lifestyle and smoking/ tobacco exposure.   ------------------------------------------------------------------------    Lipid/Cholesterol, Follow-up:   Last seen for this 1 years ago.  Management since that visit includes none.  Last Lipid Panel:    Component Value Date/Time   CHOL 178 06/14/2016 0913   TRIG 118 06/14/2016 0913   HDL 92 06/14/2016 0913   CHOLHDL 1.9 06/14/2016 0913   LDLCALC 62 06/14/2016 0913    She reports excellent compliance with treatment. She is not having side effects.   Wt Readings from Last 3 Encounters:  12/27/17 114 lb (51.7 kg)  12/22/17 114 lb (51.7 kg)  12/13/17 118 lb (53.5 kg)    ------------------------------------------------------------------------     Allergies  Allergen Reactions  . Codeine Itching  . Cyclobenzaprine Itching  . Penicillins     itching     Current Outpatient Medications:  .  albuterol (PROVENTIL HFA;VENTOLIN  HFA) 108 (90 Base) MCG/ACT inhaler, ALBUTEROL, 90MCG/ACT (Inhalation Aerosol Solution)  2 PUFFS Every Day, As Needed for 0 days  Quantity: 0.00;  Refills: 0   Ordered :09-Jan-2014  Denna Haggard, MA, Anastasiya ;  Started 10-Aug-2009 Active Comments: Medication taken as needed. , Disp: , Rfl:  .  aspirin 325 MG tablet, Take 325 mg by mouth daily., Disp: , Rfl:  .  atorvastatin (LIPITOR) 40 MG tablet, Take 40 mg by mouth daily., Disp: , Rfl:  .  cholecalciferol (VITAMIN D) 1000 units tablet, Take 1 tablet (1,000 Units total) by mouth daily., Disp: 90 tablet, Rfl: 3 .  diltiazem (CARDIZEM) 120 MG tablet, Take 1 tablet (120 mg total) by mouth daily., Disp: 90 tablet, Rfl: 3 .  dorzolamide (TRUSOPT) 2 % ophthalmic solution, , Disp: , Rfl:  .  Fluticasone-Salmeterol (ADVAIR DISKUS) 250-50 MCG/DOSE AEPB, USE 1 INHALATION TWICE A DAY, Disp: 180 each, Rfl: 3 .  HYDROcodone-acetaminophen (NORCO) 7.5-325 MG tablet, Take 1 tablet by mouth every 6 (six) hours as needed for severe pain., Disp: 120 tablet, Rfl: 0 .  [START ON 01/12/2018] HYDROcodone-acetaminophen (NORCO) 7.5-325 MG tablet, Take 1 tablet by mouth every 6 (six) hours as needed for severe pain., Disp: 120 tablet, Rfl: 0 .  HYDROcodone-acetaminophen (NORCO) 7.5-325 MG tablet, Take 1 tablet by mouth every 6 (six) hours as needed for severe pain., Disp: 120 tablet, Rfl: 0 .  latanoprost (XALATAN) 0.005 % ophthalmic solution, 1 drop at bedtime., Disp: , Rfl:  .  meloxicam (MOBIC) 15 MG tablet, Take 1 tablet (15 mg total) by mouth daily., Disp: 90 tablet, Rfl: 0 .  potassium chloride 20 MEQ/15ML (10%) SOLN, Take 15 mLs (20 mEq total) by mouth daily., Disp: 450 mL, Rfl: 0 .  pregabalin (LYRICA) 150 MG capsule, Take 1 capsule (150 mg total) by mouth every 8 (eight) hours., Disp: 270 capsule, Rfl: 0 .  ramipril (ALTACE) 10 MG capsule, Take 1 capsule by mouth daily., Disp: , Rfl:  .  rOPINIRole (REQUIP XL) 2 MG 24 hr tablet, Take 1 tablet (2 mg total) by mouth  at bedtime., Disp: 90 tablet, Rfl: 0 .  spironolactone (ALDACTONE) 25 MG tablet, Take 25 mg by mouth daily., Disp: , Rfl:  .  vitamin B-12 (CYANOCOBALAMIN) 500 MCG tablet, Take 500 mcg by mouth daily., Disp: , Rfl:   Review of Systems  Constitutional: Positive for diaphoresis and fatigue.  HENT: Negative.   Eyes: Negative.   Respiratory: Positive for shortness of breath and wheezing.   Cardiovascular: Positive for leg swelling.  Gastrointestinal: Positive for diarrhea.  Endocrine: Negative.   Genitourinary: Negative.   Musculoskeletal: Positive for myalgias.  Skin: Negative.   Allergic/Immunologic: Positive for environmental allergies.  Neurological: Negative.   Hematological: Negative.   Psychiatric/Behavioral: Negative.     Social History   Tobacco Use  . Smoking status: Current Every Day Smoker    Packs/day: 1.00    Years: 50.00    Pack years: 50.00    Types: Cigarettes  . Smokeless tobacco: Never Used  Substance Use Topics  . Alcohol use: Yes    Alcohol/week: 25.2 oz    Types: 42 Cans of beer per week   Objective:  Vitals: BP (!) 164/84 (BP Location: Right Arm)   Pulse 72   Temp 98.1 F (36.7 C) (Oral)   Ht 4\' 11"  (1.499 m)   BMI 23.03 kg/m   Body mass index is 23.03 kg/m.  Physical Exam  Constitutional: She appears well-developed and well-nourished. No distress.  Neck: Normal range of motion. Neck supple. No JVD present. No tracheal deviation present. No thyromegaly present.  Cardiovascular: Normal rate, regular rhythm and normal heart sounds. Exam reveals no gallop and no friction rub.  No murmur heard. Pulmonary/Chest: Effort normal and breath sounds normal. No respiratory distress. She has no wheezes. She has no rales.  Musculoskeletal: She exhibits no edema.  Lymphadenopathy:    She has no cervical adenopathy.  Skin: She is not diaphoretic.  Psychiatric: She has a normal mood and affect. Her behavior is normal. Judgment and thought content normal.    Vitals reviewed.      Assessment & Plan:     1. Essential hypertension Stable. Diagnosis pulled for medication refill. Continue current medical treatment plan. Will check labs as below and f/u pending results. - diltiazem (CARDIZEM) 120 MG tablet; Take 1 tablet (120 mg total) by mouth daily.  Dispense: 90 tablet; Refill: 3 - spironolactone (ALDACTONE) 25 MG tablet; Take 1 tablet (25 mg total) by mouth daily.  Dispense: 90 tablet; Refill: 3 - ramipril (ALTACE) 10 MG capsule; Take 1 capsule (10 mg total) by mouth daily.  Dispense: 90 capsule; Refill: 3 - CBC w/Diff/Platelet - Comprehensive Metabolic Panel (CMET) - Lipid Profile  2. Renal artery stenosis (HCC) 60% stenosed per vascular duplex. Discussed renal angiogram and possible stenting but patient declined at the time. Patient is to f/u in 6 months with vascular. - atorvastatin (LIPITOR) 40 MG tablet; Take 1 tablet (40 mg total) by mouth daily.  Dispense: 90 tablet; Refill: 3 - CBC w/Diff/Platelet - Comprehensive Metabolic Panel (CMET) - Lipid Profile  3.  Pure hypercholesterolemia Stable. Diagnosis pulled for medication refill. Continue current medical treatment plan. Will check labs as below and f/u pending results. - atorvastatin (LIPITOR) 40 MG tablet; Take 1 tablet (40 mg total) by mouth daily.  Dispense: 90 tablet; Refill: 3 - CBC w/Diff/Platelet - Comprehensive Metabolic Panel (CMET) - Lipid Profile  4. Vitamin D deficiency H/O this. On OTC supplementation. Will check labs as below and f/u pending results. - CBC w/Diff/Platelet - Vitamin D (25 hydroxy)  5. B12 deficiency H/O this on OTC supplementation. Will check labs as below and f/u pending results. - CBC w/Diff/Platelet - B12  6. Chronic obstructive pulmonary disease, unspecified COPD type (HCC) Stable. Diagnosis pulled for medication refill. Continue current medical treatment plan. - Fluticasone-Salmeterol (ADVAIR DISKUS) 250-50 MCG/DOSE AEPB; USE 1 INHALATION  TWICE A DAY  Dispense: 180 each; Refill: 3  7. Uncomplicated opioid dependence (HCC) Followed by pain clinic.   8. Atrial paroxysmal tachycardia (HCC) Stable.  9. Alcoholic (HCC)       Margaretann LovelessJennifer M Jhordan Kinter, PA-C  [facility]

## 2018-01-01 NOTE — Progress Notes (Signed)
Subjective:   Suzanne Avila is a 72 y.o. female who presents for Medicare Annual (Subsequent) preventive examination.  Review of Systems:  N/A Cardiac Risk Factors include: advanced age (>17men, >79 women);sedentary lifestyle;smoking/ tobacco exposure;dyslipidemia;hypertension     Objective:     Vitals: BP (!) 164/84 (BP Location: Right Arm)   Pulse 72   Temp 98.1 F (36.7 C) (Oral)   Ht 4\' 11"  (1.499 m)   BMI 23.03 kg/m   Body mass index is 23.03 kg/m.  Advanced Directives 01/01/2018 11/01/2017 08/01/2017 05/02/2017 01/31/2017 10/27/2016 07/25/2016  Does Patient Have a Medical Advance Directive? No No No No No No No  Would patient like information on creating a medical advance directive? No - Patient declined No - Patient declined - No - Patient declined - No - Patient declined Yes - Transport planner given    Tobacco Social History   Tobacco Use  Smoking Status Current Every Day Smoker  . Packs/day: 1.00  . Years: 50.00  . Pack years: 50.00  . Types: Cigarettes  Smokeless Tobacco Never Used     Ready to quit: No Counseling given: No   Clinical Intake:  Pre-visit preparation completed: Yes  Pain : No/denies pain Pain Score: 0-No pain     Nutritional Status: BMI of 19-24  Normal Nutritional Risks: None Diabetes: No  How often do you need to have someone help you when you read instructions, pamphlets, or other written materials from your doctor or pharmacy?: 1 - Never  Interpreter Needed?: No  Information entered by :: [facility], LPN  Past Medical History:  Diagnosis Date  . Glaucoma   . Hyperlipidemia   . Hypertension    Past Surgical History:  Procedure Laterality Date  . BACK SURGERY    . CARPAL TUNNEL RELEASE Left   . EYE SURGERY     Family History  Problem Relation Age of Onset  . Cancer Mother   . Cancer Father    Social History   Socioeconomic History  . Marital status: Married    Spouse name: None  . Number of children: 2  .  Years of education: None  . Highest education level: 12th grade  Social Needs  . Financial resource strain: Not hard at all  . Food insecurity - worry: Never true  . Food insecurity - inability: Never true  . Transportation needs - medical: No  . Transportation needs - non-medical: No  Occupational History  . None  Tobacco Use  . Smoking status: Current Every Day Smoker    Packs/day: 1.00    Years: 50.00    Pack years: 50.00    Types: Cigarettes  . Smokeless tobacco: Never Used  Substance and Sexual Activity  . Alcohol use: Yes    Alcohol/week: 25.2 oz    Types: 42 Cans of beer per week  . Drug use: No  . Sexual activity: None  Other Topics Concern  . None  Social History Narrative  . None    Outpatient Encounter Medications as of 01/01/2018  Medication Sig  . albuterol (PROVENTIL HFA;VENTOLIN HFA) 108 (90 Base) MCG/ACT inhaler ALBUTEROL, 90MCG/ACT (Inhalation Aerosol Solution)  2 PUFFS Every Day, As Needed for 0 days  Quantity: 0.00;  Refills: 0   Ordered :09-Jan-2014  Denna Haggard, MA, Anastasiya ;  Started 10-Aug-2009 Active Comments: Medication taken as needed.   Marland Kitchen aspirin 325 MG tablet Take 325 mg by mouth daily.  Marland Kitchen atorvastatin (LIPITOR) 40 MG tablet Take 40 mg by mouth daily.  Marland Kitchen  cholecalciferol (VITAMIN D) 1000 units tablet Take 1 tablet (1,000 Units total) by mouth daily.  Marland Kitchen. diltiazem (CARDIZEM) 120 MG tablet Take 1 tablet (120 mg total) by mouth daily.  . dorzolamide (TRUSOPT) 2 % ophthalmic solution   . Fluticasone-Salmeterol (ADVAIR DISKUS) 250-50 MCG/DOSE AEPB USE 1 INHALATION TWICE A DAY  . HYDROcodone-acetaminophen (NORCO) 7.5-325 MG tablet Take 1 tablet by mouth every 6 (six) hours as needed for severe pain.  Melene Muller. [START ON 01/12/2018] HYDROcodone-acetaminophen (NORCO) 7.5-325 MG tablet Take 1 tablet by mouth every 6 (six) hours as needed for severe pain.  Marland Kitchen. HYDROcodone-acetaminophen (NORCO) 7.5-325 MG tablet Take 1 tablet by mouth every 6 (six) hours as needed  for severe pain.  Marland Kitchen. latanoprost (XALATAN) 0.005 % ophthalmic solution 1 drop at bedtime.  . meloxicam (MOBIC) 15 MG tablet Take 1 tablet (15 mg total) by mouth daily.  . potassium chloride 20 MEQ/15ML (10%) SOLN Take 15 mLs (20 mEq total) by mouth daily.  . pregabalin (LYRICA) 150 MG capsule Take 1 capsule (150 mg total) by mouth every 8 (eight) hours.  . ramipril (ALTACE) 10 MG capsule Take 1 capsule by mouth daily.  Marland Kitchen. rOPINIRole (REQUIP XL) 2 MG 24 hr tablet Take 1 tablet (2 mg total) by mouth at bedtime.  Marland Kitchen. spironolactone (ALDACTONE) 25 MG tablet Take 25 mg by mouth daily.  . vitamin B-12 (CYANOCOBALAMIN) 500 MCG tablet Take 500 mcg by mouth daily.   No facility-administered encounter medications on file as of 01/01/2018.     Activities of Daily Living In your present state of health, do you have any difficulty performing the following activities: 01/01/2018 12/07/2017  Hearing? Y N  Comment Pt has seen an audiologist and was told she needs hearing aids- pt declined. -  Vision? N N  Difficulty concentrating or making decisions? Y N  Walking or climbing stairs? Y Y  Comment Due to neuropathy. -  Dressing or bathing? N N  Doing errands, shopping? N N  Preparing Food and eating ? N -  Using the Toilet? N -  In the past six months, have you accidently leaked urine? N -  Do you have problems with loss of bowel control? N -  Managing your Medications? N -  Managing your Finances? N -  Housekeeping or managing your Housekeeping? N -  Some recent data might be hidden    Patient Care Team: Reine JustBurnette, Jennifer M, PA-C as PCP - General (Family Medicine) Pa, [facility] as Consulting Physician (Optometry) Dew, Marlow BaarsJason S, MD as Referring Physician (Vascular Surgery) Delano MetzNaveira, Francisco, MD as Referring Physician (Pain Medicine)    Assessment:   This is a routine wellness examination for Suzanne LundJanet.  Exercise Activities and Dietary recommendations Current Exercise Habits: The patient does  not participate in regular exercise at present, Exercise limited by: Other - see comments(stays busy around the house)  Goals    . Reduce alcohol intake     Recommend cutting out all alcohol. Pt declined, recommend cutting consumption in half.        Fall Risk Fall Risk  01/01/2018 12/13/2017 11/01/2017 08/01/2017 05/02/2017  Falls in the past year? Yes No No Yes No  Number falls in past yr: 1 - - 1 -  Injury with Fall? No - - Yes -  Comment - - - felt pain in low back -  Follow up Falls prevention discussed - - - -   Is the patient's home free of loose throw rugs in walkways, pet beds,  electrical cords, etc?   yes      Grab bars in the bathroom? yes      Handrails on the stairs?   yes      Adequate lighting?   yes  Timed Get Up and Go performed: N/A  Depression Screen PHQ 2/9 Scores 01/01/2018 12/13/2017 11/01/2017 08/01/2017  PHQ - 2 Score 0 0 0 0  PHQ- 9 Score - - - -     Cognitive Function     6CIT Screen 01/01/2018  What Year? 0 points  What month? 0 points  What time? 0 points  Count back from 20 0 points  Months in reverse 2 points  Repeat phrase 2 points  Total Score 4    Immunization History  Administered Date(s) Administered  . Influenza Split 07/14/2010, 08/16/2011  . Influenza,inj,Quad PF,6+ Mos 07/09/2014  . Pneumococcal Conjugate-13 01/09/2014  . Pneumococcal Polysaccharide-23 12/01/2009  . Zoster 05/23/2014    Qualifies for Shingles Vaccine? Due for Shingles vaccine. Declined my offer to administer today. Education has been provided regarding the importance of this vaccine. Pt has been advised to call her insurance company to determine her out of pocket expense. Advised she may also receive this vaccine at her local pharmacy or Health Dept. Verbalized acceptance and understanding.   Screening Tests Health Maintenance  Topic Date Due  . Hepatitis C Screening  [birth year]  . TETANUS/TDAP  02/21/1964  . PNA vac Low Risk Adult (2 of 2 - PPSV23) 01/10/2015  .  MAMMOGRAM  06/10/2019  . COLONOSCOPY  01/29/2021  . INFLUENZA VACCINE  Completed  . DEXA SCAN  Completed    Cancer Screenings: Lung: Low Dose CT Chest recommended if Age 1-80 years, 30 pack-year currently smoking OR have quit w/in 15years. Patient does qualify, scheduled for 01/04/18. Breast:  Up to date on Mammogram? Yes   Up to date of Bone Density/Dexa? Yes Colorectal: Up to date  Additional Screenings:  Hepatitis C Screening: Pt declines today.      Plan:  I have personally reviewed and addressed the Medicare Annual Wellness questionnaire and have noted the following in the patient's chart:  A. Medical and social history B. Use of alcohol, tobacco or illicit drugs  C. Current medications and supplements D. Functional ability and status E.  Nutritional status F.  Physical activity G. Advance directives H. List of other physicians I.  Hospitalizations, surgeries, and ER visits in previous 12 months J.  Vitals K. Screenings such as hearing and vision if needed, cognitive and depression L. Referrals and appointments - none  In addition, I have reviewed and discussed with patient certain preventive protocols, quality metrics, and best practice recommendations. A written personalized care plan for preventive services as well as general preventive health recommendations were provided to patient.  See attached scanned questionnaire for additional information.   Signed,  Hyacinth Meeker, LPN Nurse Health Advisor   Nurse Recommendations: Pt declined the tetanus and pneumonia vaccines today. Pt thinks she may have had her other pneumonia shot at her pharmacy. Will contact pharmacy to update records. Pt would like to speak with PCP further regarding the need for the hepatitis C lab. Please follow up with BP high readings.

## 2018-01-01 NOTE — Patient Instructions (Addendum)
Suzanne Avila , Thank you for taking time to come for your Medicare Wellness Visit. I appreciate your ongoing commitment to your health goals. Please review the following plan we discussed and let me know if I can assist you in the future.   Screening recommendations/referrals: Colonoscopy: Up to date Mammogram: Up to date Bone Density: Up to date Recommended yearly ophthalmology/optometry visit for glaucoma screening and checkup Recommended yearly dental visit for hygiene and checkup  Vaccinations: Influenza vaccine: Up to date Pneumococcal vaccine: Up to date Tdap vaccine: Pt declines today.  Shingles vaccine: Pt declines today.     Advanced directives: Advance directive discussed with you today. Even though you declined this today please call our office should you change your mind and we can give you the proper paperwork for you to fill out.  Conditions/risks identified: Smoking cessation, fall prevention, recommend cutting out all alcohol. Pt declined, recommend cutting consumption in half.   Next appointment: 10:00 AM today with Suzanne Avila.    Preventive Care 5865 Years and Older, Female Preventive care refers to lifestyle choices and visits with your health care provider that can promote health and wellness. What does preventive care include?  A yearly physical exam. This is also called an annual well check.  Dental exams once or twice a year.  Routine eye exams. Ask your health care provider how often you should have your eyes checked.  Personal lifestyle choices, including:  Daily care of your teeth and gums.  Regular physical activity.  Eating a healthy diet.  Avoiding tobacco and drug use.  Limiting alcohol use.  Practicing safe sex.  Taking low-dose aspirin every day.  Taking vitamin and mineral supplements as recommended by your health care provider. What happens during an annual well check? The services and screenings done by your health care  provider during your annual well check will depend on your age, overall health, lifestyle risk factors, and family history of disease. Counseling  Your health care provider may ask you questions about your:  Alcohol use.  Tobacco use.  Drug use.  Emotional well-being.  Home and relationship well-being.  Sexual activity.  Eating habits.  History of falls.  Memory and ability to understand (cognition).  Work and work Astronomerenvironment.  Reproductive health. Screening  You may have the following tests or measurements:  Height, weight, and BMI.  Blood pressure.  Lipid and cholesterol levels. These may be checked every 5 years, or more frequently if you are over 895 years old.  Skin check.  Lung cancer screening. You may have this screening every year starting at age 73 if you have a 30-pack-year history of smoking and currently smoke or have quit within the past 15 years.  Fecal occult blood test (FOBT) of the stool. You may have this test every year starting at age 73.  Flexible sigmoidoscopy or colonoscopy. You may have a sigmoidoscopy every 5 years or a colonoscopy every 10 years starting at age 73.  Hepatitis C blood test.  Hepatitis B blood test.  Sexually transmitted disease (STD) testing.  Diabetes screening. This is done by checking your blood sugar (glucose) after you have not eaten for a while (fasting). You may have this done every 1-3 years.  Bone density scan. This is done to screen for osteoporosis. You may have this done starting at age 73.  Mammogram. This may be done every 1-2 years. Talk to your health care provider about how often you should have regular mammograms. Talk with your health  care provider about your test results, treatment options, and if necessary, the need for more tests. Vaccines  Your health care provider may recommend certain vaccines, such as:  Influenza vaccine. This is recommended every year.  Tetanus, diphtheria, and acellular  pertussis (Tdap, Td) vaccine. You may need a Td booster every 10 years.  Zoster vaccine. You may need this after age 75.  Pneumococcal 13-valent conjugate (PCV13) vaccine. One dose is recommended after age 17.  Pneumococcal polysaccharide (PPSV23) vaccine. One dose is recommended after age 51. Talk to your health care provider about which screenings and vaccines you need and how often you need them. This information is not intended to replace advice given to you by your health care provider. Make sure you discuss any questions you have with your health care provider. Document Released: 11/06/2015 Document Revised: 06/29/2016 Document Reviewed: 08/11/2015 Elsevier Interactive Patient Education  2017 [place] Prevention in the Home Falls can cause injuries. They can happen to people of all ages. There are many things you can do to make your home safe and to help prevent falls. What can I do on the outside of my home?  Regularly fix the edges of walkways and driveways and fix any cracks.  Remove anything that might make you trip as you walk through a door, such as a raised step or threshold.  Trim any bushes or trees on the path to your home.  Use bright outdoor lighting.  Clear any walking paths of anything that might make someone trip, such as rocks or tools.  Regularly check to see if handrails are loose or broken. Make sure that both sides of any steps have handrails.  Any raised decks and porches should have guardrails on the edges.  Have any leaves, snow, or ice cleared regularly.  Use sand or salt on walking paths during winter.  Clean up any spills in your garage right away. This includes oil or grease spills. What can I do in the bathroom?  Use night lights.  Install grab bars by the toilet and in the tub and shower. Do not use towel bars as grab bars.  Use non-skid mats or decals in the tub or shower.  If you need to sit down in the shower, use a plastic,  non-slip stool.  Keep the floor dry. Clean up any water that spills on the floor as soon as it happens.  Remove soap buildup in the tub or shower regularly.  Attach bath mats securely with double-sided non-slip rug tape.  Do not have throw rugs and other things on the floor that can make you trip. What can I do in the bedroom?  Use night lights.  Make sure that you have a light by your bed that is easy to reach.  Do not use any sheets or blankets that are too big for your bed. They should not hang down onto the floor.  Have a firm chair that has side arms. You can use this for support while you get dressed.  Do not have throw rugs and other things on the floor that can make you trip. What can I do in the kitchen?  Clean up any spills right away.  Avoid walking on wet floors.  Keep items that you use a lot in easy-to-reach places.  If you need to reach something above you, use a strong step stool that has a grab bar.  Keep electrical cords out of the way.  Do not use floor  polish or wax that makes floors slippery. If you must use wax, use non-skid floor wax.  Do not have throw rugs and other things on the floor that can make you trip. What can I do with my stairs?  Do not leave any items on the stairs.  Make sure that there are handrails on both sides of the stairs and use them. Fix handrails that are broken or loose. Make sure that handrails are as long as the stairways.  Check any carpeting to make sure that it is firmly attached to the stairs. Fix any carpet that is loose or worn.  Avoid having throw rugs at the top or bottom of the stairs. If you do have throw rugs, attach them to the floor with carpet tape.  Make sure that you have a light switch at the top of the stairs and the bottom of the stairs. If you do not have them, ask someone to add them for you. What else can I do to help prevent falls?  Wear shoes that:  Do not have high heels.  Have rubber  bottoms.  Are comfortable and fit you well.  Are closed at the toe. Do not wear sandals.  If you use a stepladder:  Make sure that it is fully opened. Do not climb a closed stepladder.  Make sure that both sides of the stepladder are locked into place.  Ask someone to hold it for you, if possible.  Clearly mark and make sure that you can see:  Any grab bars or handrails.  First and last steps.  Where the edge of each step is.  Use tools that help you move around (mobility aids) if they are needed. These include:  Canes.  Walkers.  Scooters.  Crutches.  Turn on the lights when you go into a dark area. Replace any light bulbs as soon as they burn out.  Set up your furniture so you have a clear path. Avoid moving your furniture around.  If any of your floors are uneven, fix them.  If there are any pets around you, be aware of where they are.  Review your medicines with your doctor. Some medicines can make you feel dizzy. This can increase your chance of falling. Ask your doctor what other things that you can do to help prevent falls. This information is not intended to replace advice given to you by your health care provider. Make sure you discuss any questions you have with your health care provider. Document Released: 08/06/2009 Document Revised: 03/17/2016 Document Reviewed: 11/14/2014 Elsevier Interactive Patient Education  2017 [employer].

## 2018-01-02 ENCOUNTER — Telehealth: Payer: Self-pay

## 2018-01-02 LAB — CBC WITH DIFFERENTIAL/PLATELET
Basophils Absolute: 0.1 10*3/uL (ref 0.0–0.2)
Basos: 1 %
EOS (ABSOLUTE): 0.4 10*3/uL (ref 0.0–0.4)
Eos: 6 %
Hematocrit: 42.6 % (ref 34.0–46.6)
Hemoglobin: 14.2 g/dL (ref 11.1–15.9)
Immature Grans (Abs): 0 10*3/uL (ref 0.0–0.1)
Immature Granulocytes: 0 %
Lymphocytes Absolute: 1.6 10*3/uL (ref 0.7–3.1)
Lymphs: 23 %
MCH: 32.3 pg (ref 26.6–33.0)
MCHC: 33.3 g/dL (ref 31.5–35.7)
MCV: 97 fL (ref 79–97)
Monocytes Absolute: 0.7 10*3/uL (ref 0.1–0.9)
Monocytes: 10 %
Neutrophils Absolute: 4.2 10*3/uL (ref 1.4–7.0)
Neutrophils: 60 %
Platelets: 212 10*3/uL (ref 150–379)
RBC: 4.39 x10E6/uL (ref 3.77–5.28)
RDW: 13.9 % (ref 12.3–15.4)
WBC: 6.9 10*3/uL (ref 3.4–10.8)

## 2018-01-02 LAB — VITAMIN D 25 HYDROXY (VIT D DEFICIENCY, FRACTURES): Vit D, 25-Hydroxy: 31.8 ng/mL (ref 30.0–100.0)

## 2018-01-02 LAB — COMPREHENSIVE METABOLIC PANEL
ALT: 8 IU/L (ref 0–32)
AST: 13 IU/L (ref 0–40)
Albumin/Globulin Ratio: 1.5 (ref 1.2–2.2)
Albumin: 4.2 g/dL (ref 3.5–4.8)
Alkaline Phosphatase: 100 IU/L (ref 39–117)
BUN/Creatinine Ratio: 12 (ref 12–28)
BUN: 9 mg/dL (ref 8–27)
Bilirubin Total: 0.4 mg/dL (ref 0.0–1.2)
CO2: 26 mmol/L (ref 20–29)
Calcium: 9.4 mg/dL (ref 8.7–10.3)
Chloride: 102 mmol/L (ref 96–106)
Creatinine, Ser: 0.75 mg/dL (ref 0.57–1.00)
GFR calc Af Amer: 92 mL/min/{1.73_m2} (ref >59)
GFR calc non Af Amer: 80 mL/min/{1.73_m2} (ref >59)
Globulin, Total: 2.8 g/dL (ref 1.5–4.5)
Glucose: 75 mg/dL (ref 65–99)
Potassium: 4.3 mmol/L (ref 3.5–5.2)
Sodium: 143 mmol/L (ref 134–144)
Total Protein: 7 g/dL (ref 6.0–8.5)

## 2018-01-02 LAB — LIPID PANEL
Chol/HDL Ratio: 3 ratio (ref 0.0–4.4)
Cholesterol, Total: 209 mg/dL — ABNORMAL HIGH (ref 100–199)
HDL: 70 mg/dL (ref >39)
LDL Calculated: 118 mg/dL — ABNORMAL HIGH (ref 0–99)
Triglycerides: 105 mg/dL (ref 0–149)
VLDL Cholesterol Cal: 21 mg/dL (ref 5–40)

## 2018-01-02 LAB — VITAMIN B12: Vitamin B-12: 1427 pg/mL — ABNORMAL HIGH (ref 232–1245)

## 2018-01-02 NOTE — Telephone Encounter (Signed)
-----   Message from Margaretann LovelessJennifer M Burnette, PA-C sent at 01/02/2018  8:22 AM EDT ----- Cholesterol up slightly from last year but I know you have been off your cholesterol medication. Make sure to continue this once it is received. All other labs are all normal and stable.

## 2018-01-02 NOTE — Telephone Encounter (Signed)
LM with Mr.Zuercher TCB.  Thanks,  -Alexiz Cothran

## 2018-01-04 ENCOUNTER — Ambulatory Visit
Admission: RE | Admit: 2018-01-04 | Discharge: 2018-01-04 | Disposition: A | Discharge status: Home / Self Care | Payer: Medicare Other | Source: Ambulatory Visit | Attending: Nurse Practitioner | Admitting: Nurse Practitioner

## 2018-01-04 ENCOUNTER — Ambulatory Visit: Admission: RE | Admit: 2018-01-04 | Payer: Medicare Other | Source: Ambulatory Visit

## 2018-01-04 ENCOUNTER — Inpatient Hospital Stay: Payer: Medicare Other | Attending: Nurse Practitioner | Admitting: Nurse Practitioner

## 2018-01-04 DIAGNOSIS — Z87891 Personal history of nicotine dependence: Secondary | ICD-10-CM

## 2018-01-04 DIAGNOSIS — I700 Atherosclerosis of aorta: Secondary | ICD-10-CM | POA: Diagnosis not present

## 2018-01-04 DIAGNOSIS — Z122 Encounter for screening for malignant neoplasm of respiratory organs: Secondary | ICD-10-CM | POA: Insufficient documentation

## 2018-01-04 DIAGNOSIS — F17210 Nicotine dependence, cigarettes, uncomplicated: Secondary | ICD-10-CM

## 2018-01-04 DIAGNOSIS — J439 Emphysema, unspecified: Secondary | ICD-10-CM | POA: Insufficient documentation

## 2018-01-04 DIAGNOSIS — R911 Solitary pulmonary nodule: Secondary | ICD-10-CM | POA: Diagnosis not present

## 2018-01-04 DIAGNOSIS — I2510 Atherosclerotic heart disease of native coronary artery without angina pectoris: Secondary | ICD-10-CM | POA: Diagnosis not present

## 2018-01-05 ENCOUNTER — Telehealth: Payer: Self-pay | Admitting: *Deleted

## 2018-01-05 NOTE — Progress Notes (Signed)
In accordance with CMS guidelines, patient has met eligibility criteria including age, absence of signs or symptoms of lung cancer.  Social History   Tobacco Use  . Smoking status: Current Every Day Smoker    Packs/day: 1.00    Years: 50.00    Pack years: 50.00    Types: Cigarettes  . Smokeless tobacco: Never Used  Substance Use Topics  . Alcohol use: Yes    Alcohol/week: 25.2 oz    Types: 42 Cans of beer per week  . Drug use: No     A shared decision-making session was conducted prior to the performance of CT scan. This includes one or more decision aids, includes benefits and harms of screening, follow-up diagnostic testing, over-diagnosis, false positive rate, and total radiation exposure.  Counseling on the importance of adherence to annual lung cancer LDCT screening, impact of co-morbidities, and ability or willingness to undergo diagnosis and treatment is imperative for compliance of the program.  Counseling on the importance of continued smoking cessation for former smokers; the importance of smoking cessation for current smokers, and information about tobacco cessation interventions have been given to patient including [place] and [facility] quit [place] programs.  Written order for lung cancer screening with LDCT has been given to the patient and any and all questions have been answered to the best of my abilities.   Yearly follow up will be coordinated by Burgess Estelle, Thoracic Navigator.  Beckey Rutter, DNP, AGNP-C [place] [facility] [phone] (office) 01/05/18 3:31 PM

## 2018-01-05 NOTE — Telephone Encounter (Signed)
Notified patient of LDCT lung cancer screening program results with recommendation for 6 month follow up imaging. Also notified of incidental findings noted below and is encouraged to discuss further with PCP who will receive a copy of this note and/or the CT report. Patient verbalizes understanding and reports vascular findings that correspond with the renal atrophy described in the body of this scan report.   IMPRESSION: 1. Lung-RADS 3, probably benign findings. Part solid 7.1 mm right upper lobe pulmonary nodule. Short-term follow-up in 6 months is recommended with repeat low-dose chest CT without contrast (please use the following order, "CT CHEST LCS NODULE FOLLOW-UP W/O CM"). 2. Two-vessel coronary atherosclerosis.  Aortic Atherosclerosis (ICD10-I70.0) and Emphysema (ICD10-J43.9).

## 2018-01-09 ENCOUNTER — Ambulatory Visit: Payer: Medicare Other | Admitting: Nurse Practitioner

## 2018-01-10 NOTE — Telephone Encounter (Signed)
Patient advised as directed below.  Thanks,  -Joseline 

## 2018-01-23 DIAGNOSIS — M79641 Pain in right hand: Secondary | ICD-10-CM | POA: Diagnosis not present

## 2018-01-23 DIAGNOSIS — M19041 Primary osteoarthritis, right hand: Secondary | ICD-10-CM | POA: Diagnosis not present

## 2018-01-31 ENCOUNTER — Ambulatory Visit: Payer: Medicare Other | Attending: Nurse Practitioner | Admitting: Nurse Practitioner

## 2018-01-31 ENCOUNTER — Encounter: Payer: Self-pay | Admitting: Nurse Practitioner

## 2018-01-31 ENCOUNTER — Other Ambulatory Visit: Payer: Self-pay

## 2018-01-31 VITALS — BP 163/84 | HR 93 | Temp 98.0°F | Resp 18 | Ht 59.0 in | Wt 115.0 lb

## 2018-01-31 DIAGNOSIS — Z79891 Long term (current) use of opiate analgesic: Secondary | ICD-10-CM | POA: Diagnosis not present

## 2018-01-31 DIAGNOSIS — F17210 Nicotine dependence, cigarettes, uncomplicated: Secondary | ICD-10-CM | POA: Diagnosis not present

## 2018-01-31 DIAGNOSIS — M797 Fibromyalgia: Secondary | ICD-10-CM | POA: Insufficient documentation

## 2018-01-31 DIAGNOSIS — K219 Gastro-esophageal reflux disease without esophagitis: Secondary | ICD-10-CM | POA: Insufficient documentation

## 2018-01-31 DIAGNOSIS — M25552 Pain in left hip: Secondary | ICD-10-CM | POA: Diagnosis not present

## 2018-01-31 DIAGNOSIS — M48061 Spinal stenosis, lumbar region without neurogenic claudication: Secondary | ICD-10-CM | POA: Insufficient documentation

## 2018-01-31 DIAGNOSIS — M1612 Unilateral primary osteoarthritis, left hip: Secondary | ICD-10-CM | POA: Insufficient documentation

## 2018-01-31 DIAGNOSIS — Z79899 Other long term (current) drug therapy: Secondary | ICD-10-CM | POA: Insufficient documentation

## 2018-01-31 DIAGNOSIS — Z885 Allergy status to narcotic agent status: Secondary | ICD-10-CM | POA: Insufficient documentation

## 2018-01-31 DIAGNOSIS — Z5181 Encounter for therapeutic drug level monitoring: Secondary | ICD-10-CM | POA: Insufficient documentation

## 2018-01-31 DIAGNOSIS — M47816 Spondylosis without myelopathy or radiculopathy, lumbar region: Secondary | ICD-10-CM | POA: Diagnosis not present

## 2018-01-31 DIAGNOSIS — I10 Essential (primary) hypertension: Secondary | ICD-10-CM | POA: Diagnosis not present

## 2018-01-31 DIAGNOSIS — Z7982 Long term (current) use of aspirin: Secondary | ICD-10-CM | POA: Insufficient documentation

## 2018-01-31 DIAGNOSIS — M5414 Radiculopathy, thoracic region: Secondary | ICD-10-CM | POA: Diagnosis not present

## 2018-01-31 DIAGNOSIS — G894 Chronic pain syndrome: Secondary | ICD-10-CM | POA: Diagnosis not present

## 2018-01-31 DIAGNOSIS — M961 Postlaminectomy syndrome, not elsewhere classified: Secondary | ICD-10-CM | POA: Insufficient documentation

## 2018-01-31 DIAGNOSIS — Z880 Allergy status to penicillin: Secondary | ICD-10-CM | POA: Insufficient documentation

## 2018-01-31 MED ORDER — HYDROCODONE-ACETAMINOPHEN 7.5-325 MG PO TABS: 1.0000 | ORAL_TABLET | Freq: Four times a day (QID) | ORAL | 0 refills | Status: DC | PRN

## 2018-01-31 MED ORDER — PREGABALIN 150 MG PO CAPS: 150.0000 mg | ORAL_CAPSULE | Freq: Three times a day (TID) | ORAL | 0 refills | Status: DC

## 2018-01-31 NOTE — Progress Notes (Signed)
Nursing Pain Medication Assessment:  Safety precautions to be maintained throughout the outpatient stay will include: orient to surroundings, keep bed in low position, maintain call bell within reach at all times, provide assistance with transfer out of bed and ambulation.  Medication Inspection Compliance: Pill count conducted under aseptic conditions, in front of the patient. Neither the pills nor the bottle was removed from the patient's sight at any time. Once count was completed pills were immediately returned to the patient in their original bottle.  Medication: Hydrocodone/APAP Pill/Patch Count: 48 of 120 pills remain Pill/Patch Appearance: Markings consistent with prescribed medication Bottle Appearance: Standard pharmacy container. Clearly labeled. Filled Date: 03 / 21/ 2019 Last Medication intake:  Today

## 2018-01-31 NOTE — Progress Notes (Signed)
Patient's Name: Suzanne Avila  MRN: [id number]  Referring Provider: Florian Buff*  DOB: 01/28/1946  PCP: Mar Daring, PA-C  DOS: 01/31/2018  Note by: Vevelyn Francois NP  Service setting: Ambulatory outpatient  Specialty: Interventional Pain Management  Location: [facility] (AMB) Pain Management Facility    Patient type: Established    Primary Reason(s) for Visit: Encounter for prescription drug management. (Level of risk: moderate)  CC: Hip Pain (left hip/buttock pain)  HPI  Suzanne Avila is a 72 y.o. year old, adult patient, who comes today for a medication management evaluation. 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[place]); B12 deficiency; CAFL (chronic airflow limitation) ([place]); Narrowing of intervertebral disc space; Encounter for screening for diabetes mellitus; Acid reflux; Glaucoma; History of metabolic disorder; Benign essential HTN; Fungal infection of toenail; Osteopenia; Hypercholesterolemia without hypertriglyceridemia; Compulsive tobacco user syndrome; Absence of bladder continence; Osteoarthritis of hip (Left); Chronic hip pain (Left); Chronic lumbar radicular pain (L5 dermatome) (Location of Primary Source of Pain) (Bilateral) (L>R); Lumbar facet syndrome (Location of Primary Source of Pain) (Bilateral) (L>R); Lumbar spondylosis; 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 Her primarily concern today is the Hip Pain (left hip/buttock pain)  Pain Assessment: Location: Left Buttocks(left) Radiating: denies Onset: More than a month ago Duration: Chronic pain Quality: Burning, Constant Severity: 2 /10 (self-reported pain score)  Note: Reported level is compatible with observation.                          Timing: Constant Modifying factors: medications   Suzanne Avila was last scheduled for an appointment on 12/13/2017 for medication management. During today's appointment we reviewed Suzanne Avila's chronic pain status, as well as her outpatient medication regimen. She admits that she has pain in her lower left leg but this is for the PN. She admits that her pain is stable. She denies any concerns today.   The patient  reports that she does not use drugs. Her body mass index is 23.23 kg/m.  Further details on both, my assessment(s), as well as the proposed treatment plan, please see below.  Controlled Substance Pharmacotherapy Assessment REMS (Risk Evaluation and Mitigation Strategy)  Analgesic:Hydrocodone/APAP 7.5/325 one every 6 hours (30 mg per day) MME/day:30 mg/day.    Dewayne Shorter, RN  01/31/2018  9:57 AM  Signed Nursing Pain Medication Assessment:  Safety precautions to be maintained throughout the outpatient stay will include: orient to surroundings, keep bed in low position, maintain call bell within reach at all times, provide assistance with transfer out of bed and ambulation.  Medication Inspection Compliance: Pill count conducted under aseptic conditions, in front of the patient. Neither the pills nor the bottle was removed from the patient's sight at any time.  Once count was  completed pills were immediately returned to the patient in their original bottle.  Medication: Hydrocodone/APAP Pill/Patch Count: 48 of 120 pills remain Pill/Patch Appearance: Markings consistent with prescribed medication Bottle Appearance: Standard pharmacy container. Clearly labeled. Filled Date: 03 / 21/ 2019 Last Medication intake:  Today   Pharmacokinetics: Liberation and absorption (onset of action): WNL Distribution (time to peak effect): WNL Metabolism and excretion (duration of action): WNL         Pharmacodynamics: Desired effects: Analgesia: Suzanne Avila reports >50% benefit. Functional ability: Patient reports that medication allows her to accomplish basic ADLs Clinically meaningful improvement in function (CMIF): Sustained CMIF goals met Perceived effectiveness: Described as relatively effective, allowing for increase in activities of daily living (ADL) Undesirable effects: Side-effects or Adverse reactions: None reported Monitoring: [place] PMP: Online review of the past 104-monthperiod conducted. Compliant with practice rules and regulations Last UDS on record: Summary  Date Value Ref Range Status  11/01/2017 FINAL  Final    Comment:    ==================================================================== TOXASSURE SELECT 13 (MW) ==================================================================== Test                             Result       Flag       Units Drug Present and Declared for Prescription Verification   Hydrocodone                    3117         EXPECTED   ng/mg creat   Hydromorphone                  1063         EXPECTED   ng/mg creat   Norhydrocodone                 1775         EXPECTED   ng/mg creat    Sources of hydrocodone include scheduled prescription    medications. Hydromorphone and norhydrocodone are expected    metabolites of hydrocodone. Hydromorphone is also available as a    scheduled prescription  medication. ==================================================================== Test                      Result    Flag   Units      Ref Range   Creatinine              24               mg/dL      >=20 ==================================================================== Declared Medications:  The flagging and interpretation on this report are based on the  following declared medications.  Unexpected results may arise from  inaccuracies in the declared medications.  **Note: The testing scope of this panel includes these medications:  Hydrocodone (Norco)  **Note: The testing scope of this panel does not include following  reported medications:  Acetaminophen (Norco)  Albuterol  Aspirin  Atorvastatin  Cholecalciferol  Cyanocobalamin  Diltiazem  Dorzolamide  Fluticasone  Latanoprost (Xalatan)  Meloxicam (Mobic)  Potassium (Klor-Con)  Pregabalin (Lyrica)  Salmeterol ==================================================================== For clinical consultation, please call [phone] ====================================================================    UDS interpretation: Compliant          Medication Assessment Form: Reviewed. Patient indicates being compliant with therapy Treatment compliance: Compliant Risk Assessment Profile: Aberrant behavior: See prior evaluations. None observed or detected today Comorbid factors increasing risk of overdose: See prior notes. No additional risks  detected today Risk of substance use disorder (SUD): Low Opioid Risk Tool - 12/13/17 0944      Family History of Substance Abuse   Alcohol  Positive Female    Illegal Drugs  Negative    Rx Drugs  Negative      Personal History of Substance Abuse   Alcohol  Negative    Illegal Drugs  Negative    Rx Drugs  Negative      Age   Age between 38-45 years   No      History of Preadolescent Sexual Abuse   History of Preadolescent Sexual Abuse  Negative or Female      Psychological Disease    Psychological Disease  Negative    Depression  Negative      Total Score   Opioid Risk Tool Scoring  1    Opioid Risk Interpretation  Low Risk      ORT Scoring interpretation table:  Score <3 = Low Risk for SUD  Score between 4-7 = Moderate Risk for SUD  Score >8 = High Risk for Opioid Abuse   Risk Mitigation Strategies:  Patient Counseling: Covered Patient-Prescriber Agreement (PPA): Present and active  Notification to other healthcare providers: Done  Pharmacologic Plan: No change in therapy, at this time.             Laboratory Chemistry  Inflammation Markers (CRP: Acute Phase) (ESR: Chronic Phase) Lab Results  Component Value Date   CRP <0.8 02/14/2017   ESRSEDRATE 20 02/14/2017                         Rheumatology Markers Lab Results  Component Value Date   RF <10.0 06/14/2016   ANA Negative 06/14/2016                        Renal Function Markers Lab Results  Component Value Date   BUN 9 01/01/2018   CREATININE 0.75 01/01/2018   GFRAA 92 01/01/2018   GFRNONAA 80 01/01/2018                              Hepatic Function Markers Lab Results  Component Value Date   AST 13 01/01/2018   ALT 8 01/01/2018   ALBUMIN 4.2 01/01/2018   ALKPHOS 100 01/01/2018                        Electrolytes Lab Results  Component Value Date   NA 143 01/01/2018   K 4.3 01/01/2018   CL 102 01/01/2018   CALCIUM 9.4 01/01/2018   MG 1.7 02/14/2017                        Neuropathy Markers Lab Results  Component Value Date   VITAMINB12 1,427 (H) 01/01/2018                        Bone Pathology Markers Lab Results  Component Value Date   VD25OH 31.8 01/01/2018                         Coagulation Parameters Lab Results  Component Value Date   PLT 212 01/01/2018  Cardiovascular Markers Lab Results  Component Value Date   BNP 78.2 06/14/2016   CKTOTAL 120 06/14/2016   HGB 14.2 01/01/2018   HCT 42.6 01/01/2018                          CA Markers No results found for: CEA, CA125, LABCA2                      Note: Lab results reviewed.  Recent Diagnostic Imaging Results  CT CHEST LUNG CANCER SCREENING LOW DOSE WO CONTRAST CLINICAL DATA:  72 year old asymptomatic female current smoker with 50 pack-year smoking history.  EXAM: CT CHEST WITHOUT CONTRAST LOW-DOSE FOR LUNG CANCER SCREENING  TECHNIQUE: Multidetector CT imaging of the chest was performed following the standard protocol without IV contrast.  COMPARISON:  05/27/2009 chest CT.  FINDINGS: Cardiovascular: Normal heart size. No significant pericardial fluid/thickening. Left anterior descending and right coronary atherosclerosis. Atherosclerotic nonaneurysmal thoracic aorta. Normal caliber pulmonary arteries.  Mediastinum/Nodes: No discrete thyroid nodules. Unremarkable esophagus. No pathologically enlarged axillary, mediastinal or gross hilar lymph nodes, noting limited sensitivity for the detection of hilar adenopathy on this noncontrast study.  Lungs/Pleura: No pneumothorax. No pleural effusion. Mild centrilobular emphysema with mild diffuse bronchial wall thickening. No acute consolidative airspace disease or lung masses. There is a part solid pulmonary nodule in the posterior right upper lobe measuring 7.1 mm in volume derived mean diameter with solid component measuring 2.5 mm (series 3/image 65). No additional significant pulmonary nodules.  Upper abdomen: Prominent asymmetric atrophy of the visualized right kidney, worsened since 05/27/2009.  Musculoskeletal: No aggressive appearing focal osseous lesions. 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 Mild thoracic spondylosis. Partially visualized bilateral posterior spinal fusion hardware in the lower lumbar spine on the scout radiograph.  IMPRESSION: 1. Lung-RADS 3, probably benign findings. Part solid 7.1 mm right upper lobe pulmonary nodule. Short-term  follow-up in 6 months is recommended with repeat low-dose chest CT without contrast (please use the following order, "CT CHEST LCS NODULE FOLLOW-UP W/O CM"). 2. Two-vessel coronary atherosclerosis.  Aortic Atherosclerosis (ICD10-I70.0) and Emphysema (ICD10-J43.9).  Electronically Signed   By: Ilona Sorrel M.D.   On: 01/04/2018 17:07  Complexity Note: Imaging results reviewed. Results shared with Ms. Rogue Bussing, using Layman's terms.                         Meds   Current Outpatient Medications:  .  albuterol (PROVENTIL HFA;VENTOLIN HFA) 108 (90 Base) MCG/ACT inhaler, ALBUTEROL, 90MCG/ACT (Inhalation Aerosol Solution)  2 PUFFS Every Day, As Needed for 0 days  Quantity: 0.00;  Refills: 0   Ordered :09-Jan-2014  Celene Kras, MA, Anastasiya ;  Started 10-Aug-2009 Active Comments: Medication taken as needed. , Disp: , Rfl:  .  aspirin 325 MG tablet, Take 325 mg by mouth daily., Disp: , Rfl:  .  atorvastatin (LIPITOR) 40 MG tablet, Take 1 tablet (40 mg total) by mouth daily., Disp: 90 tablet, Rfl: 3 .  cholecalciferol (VITAMIN D) 1000 units tablet, Take 1 tablet (1,000 Units total) by mouth daily., Disp: 90 tablet, Rfl: 3 .  diltiazem (CARDIZEM) 120 MG tablet, Take 1 tablet (120 mg total) by mouth daily., Disp: 90 tablet, Rfl: 3 .  dorzolamide (TRUSOPT) 2 % ophthalmic solution, , Disp: , Rfl:  .  Fluticasone-Salmeterol (ADVAIR DISKUS) 250-50 MCG/DOSE AEPB, USE 1 INHALATION TWICE A DAY, Disp: 180 each, Rfl: 3 .  [START  ON 03/13/2018] HYDROcodone-acetaminophen (NORCO) 7.5-325 MG tablet, Take 1 tablet by mouth every 6 (six) hours as needed for severe pain., Disp: 120 tablet, Rfl: 0 .  latanoprost (XALATAN) 0.005 % ophthalmic solution, 1 drop at bedtime., Disp: , Rfl:  .  potassium chloride 20 MEQ/15ML (10%) SOLN, Take 15 mLs (20 mEq total) by mouth daily., Disp: 450 mL, Rfl: 0 .  pregabalin (LYRICA) 150 MG capsule, Take 1 capsule (150 mg total) by mouth every 8 (eight) hours., Disp: 270 capsule, Rfl:  0 .  ramipril (ALTACE) 10 MG capsule, Take 1 capsule (10 mg total) by mouth daily., Disp: 90 capsule, Rfl: 3 .  rOPINIRole (REQUIP XL) 2 MG 24 hr tablet, Take 1 tablet (2 mg total) by mouth at bedtime., Disp: 90 tablet, Rfl: 0 .  spironolactone (ALDACTONE) 25 MG tablet, Take 1 tablet (25 mg total) by mouth daily., Disp: 90 tablet, Rfl: 3 .  vitamin B-12 (CYANOCOBALAMIN) 500 MCG tablet, Take 500 mcg by mouth daily., Disp: , Rfl:  .  [START ON 04/12/2018] HYDROcodone-acetaminophen (NORCO) 7.5-325 MG tablet, Take 1 tablet by mouth every 6 (six) hours as needed for severe pain., Disp: 120 tablet, Rfl: 0 .  [START ON 02/11/2018] HYDROcodone-acetaminophen (NORCO) 7.5-325 MG tablet, Take 1 tablet by mouth every 6 (six) hours as needed for severe pain., Disp: 120 tablet, Rfl: 0 .  meloxicam (MOBIC) 15 MG tablet, Take 1 tablet (15 mg total) by mouth daily., Disp: 90 tablet, Rfl: 0  ROS  Constitutional: Denies any fever or chills Gastrointestinal: No reported hemesis, hematochezia, vomiting, or acute GI distress Musculoskeletal: Denies any acute onset joint swelling, redness, loss of ROM, or weakness Neurological: No reported episodes of acute onset apraxia, aphasia, dysarthria, agnosia, amnesia, paralysis, loss of coordination, or loss of consciousness  Allergies  Ms. Brandenburger is allergic to codeine; cyclobenzaprine; and penicillins.  Bend  Drug: Ms. Kain  reports that she does not use drugs. Alcohol:  reports that she drinks about 25.2 oz of alcohol per week. Tobacco:  reports that she has been smoking cigarettes.  She has a 50.00 pack-year smoking history. She has never used smokeless tobacco. Medical:  has a past medical history of Glaucoma, Hyperlipidemia, and Hypertension. Surgical: Ms. Mckercher  has a past surgical history that includes Back surgery; Eye surgery; and Carpal tunnel release (Left). Family: family history includes Cancer in her father and mother.  Constitutional Exam  General  appearance: Well nourished, well developed, and well hydrated. In no apparent acute distress Vitals:   01/31/18 0932  BP: (!) 163/84  Pulse: 93  Resp: 18  Temp: 98 F (36.7 C)  SpO2: 100%  Weight: 115 lb (52.2 kg)  Height: '4\' 11"'$  (1.499 m)  Psych/Mental status: Alert, oriented x 3 (person, place, & time)       Eyes: PERLA Respiratory: No evidence of acute respiratory distress  Lumbar Spine Area Exam  Skin & Axial Inspection: No masses, redness, or swelling Alignment: Symmetrical Functional ROM: Unrestricted ROM      Stability: No instability detected Muscle Tone/Strength: Functionally intact. No obvious neuro-muscular anomalies detected. Sensory (Neurological): Unimpaired Palpation: Complains of area being tender to palpation       Provocative Tests: Lumbar Hyperextension and rotation test: Positive bilaterally for facet joint pain. Lumbar Lateral bending test: evaluation deferred today       Patrick's Maneuver: evaluation deferred today                    Gait & Posture Assessment  Ambulation: Unassisted Gait: Relatively normal for age and body habitus Posture: WNL   Lower Extremity Exam    Side: Right lower extremity  Side: Left lower extremity  Skin & Extremity Inspection: Skin color, temperature, and hair growth are WNL. No peripheral edema or cyanosis. No masses, redness, swelling, asymmetry, or associated skin lesions. No contractures.  Skin & Extremity Inspection: Skin color, temperature, and hair growth are WNL. No peripheral edema or cyanosis. No masses, redness, swelling, asymmetry, or associated skin lesions. No contractures.  Functional ROM: Unrestricted ROM          Functional ROM: Unrestricted ROM          Muscle Tone/Strength: Functionally intact. No obvious neuro-muscular anomalies detected.  Muscle Tone/Strength: Functionally intact. No obvious neuro-muscular anomalies detected.  Sensory (Neurological): Unimpaired  Sensory (Neurological): Unimpaired  Palpation:  No palpable anomalies  Palpation: No palpable anomalies   Assessment  Primary Diagnosis & Pertinent Problem List: The primary encounter diagnosis was Lumbar spondylosis. Diagnoses of Arthropathy of left hip, Fibromyalgia, Chronic pain syndrome, and Primary osteoarthritis of left hip were also pertinent to this visit.  Status Diagnosis  Controlled Controlled Controlled 1. Lumbar spondylosis   2. Arthropathy of left hip   3. Fibromyalgia   4. Chronic pain syndrome   5. Primary osteoarthritis of left hip     Problems updated and reviewed during this visit: No problems updated. Plan of Care  Pharmacotherapy (Medications Ordered): Meds ordered this encounter  Medications  . HYDROcodone-acetaminophen (NORCO) 7.5-325 MG tablet    Sig: Take 1 tablet by mouth every 6 (six) hours as needed for severe pain.    Dispense:  120 tablet    Refill:  0    Do not place this medication, or any other prescription from our practice, on "Automatic Refill". Patient may have prescription filled one day early if pharmacy is closed on scheduled refill date. Do not fill until:04/12/2018 To last until: 05/12/2018    Order Specific Question:   Supervising Provider    Answer:   Milinda Pointer [phone]  . HYDROcodone-acetaminophen (NORCO) 7.5-325 MG tablet    Sig: Take 1 tablet by mouth every 6 (six) hours as needed for severe pain.    Dispense:  120 tablet    Refill:  0    Do not place this medication, or any other prescription from our practice, on "Automatic Refill". Patient may have prescription filled one day early if pharmacy is closed on scheduled refill date. Do not fill until: 03/13/2018 To last until: 04/12/2018    Order Specific Question:   Supervising Provider    Answer:   Milinda Pointer [phone]  . HYDROcodone-acetaminophen (NORCO) 7.5-325 MG tablet    Sig: Take 1 tablet by mouth every 6 (six) hours as needed for severe pain.    Dispense:  120 tablet    Refill:  0    Do not place this  medication, or any other prescription from our practice, on "Automatic Refill". Patient may have prescription filled one day early if pharmacy is closed on scheduled refill date. Do not fill until: 02/11/2018 To last until:03/13/2018    Order Specific Question:   Supervising Provider    Answer:   Milinda Pointer [phone]  . pregabalin (LYRICA) 150 MG capsule    Sig: Take 1 capsule (150 mg total) by mouth every 8 (eight) hours.    Dispense:  270 capsule    Refill:  0    Do not place this medication, or any other prescription  from our practice, on "Automatic Refill". Patient may have prescription filled one day early if pharmacy is closed on scheduled refill date. 3 month supply.    Order Specific Question:   Supervising Provider    Answer:   Milinda Pointer [[id number]]   New Prescriptions   No medications on file   Medications administered today: Jesyca Weisenburger. [place] had no medications administered during this visit. Lab-work, procedure(s), and/or referral(s): No orders of the defined types were placed in this encounter.  Imaging and/or referral(s): None  Interventional therapies: Planned, scheduled, and/or pending:   Not at this time.    Provider-requested follow-up: Return in about 3 months (around 05/02/2018) for MedMgmt with Me Donella Stade Edison Pace).  Future Appointments  Date Time Provider [place]  04/03/2018  9:00 AM Mar Daring, PA-C [facility] None  05/02/2018 11:00 AM Vevelyn Francois, NP [facility] None  06/29/2018  8:30 AM [facility] V[facility] 1 [facility] None  06/29/2018  9:45 AM Dew, Erskine Squibb, MD [facility] None  01/03/2019  9:20 AM [facility]-NURSE HEALTH ADVISOR [facility] None   Primary Care Physician: Rubye Beach Location: [facility] Note by: Vevelyn Francois NP Date: 01/31/2018; Time: 3:31 PM  Pain Score Disclaimer: We use the NRS-11 scale. This is a self-reported, subjective measurement of pain severity with only modest accuracy. It is  used primarily to identify changes within a particular patient. It must be understood that outpatient pain scales are significantly less accurate that those used for research, where they can be applied under ideal controlled circumstances with minimal exposure to variables. In reality, the score is likely to be a combination of pain intensity and pain affect, where pain affect describes the degree of emotional arousal or changes in action readiness caused by the sensory experience of pain. Factors such as social and work situation, setting, emotional state, anxiety levels, expectation, and prior pain experience may influence pain perception and show large inter-individual differences that may also be affected by time variables.  Patient instructions provided during this appointment: Patient Instructions   You have been given 3 Rx for Hydrocodone today and 1 Rx for Lyrica. ____________________________________________________________________________________________  Medication Rules  Applies to: All patients receiving prescriptions (written or electronic).  Pharmacy of record: Pharmacy where electronic prescriptions will be sent. If written prescriptions are taken to a different pharmacy, please inform the nursing staff. The pharmacy listed in the electronic medical record should be the one where you would like electronic prescriptions to be sent.  Prescription refills: Only during scheduled appointments. Applies to both, written and electronic prescriptions.  NOTE: The following applies primarily to controlled substances (Opioid* Pain Medications).   Patient's responsibilities: 1. Pain Pills: Bring all pain pills to every appointment (except for procedure appointments). 2. Pill Bottles: Bring pills in original pharmacy bottle. Always bring newest bottle. Bring bottle, even if empty. 3. Medication refills: You are responsible for knowing and keeping track of what medications you need refilled. The  day before your appointment, write a list of all prescriptions that need to be refilled. Bring that list to your appointment and give it to the admitting nurse. Prescriptions will be written only during appointments. If you forget a medication, it will not be "Called in", "Faxed", or "electronically sent". You will need to get another appointment to get these prescribed. 4. Prescription Accuracy: You are responsible for carefully inspecting your prescriptions before leaving our office. Have the discharge nurse carefully go over each prescription with you, before taking them home.  Make sure that your name is accurately spelled, that your address is correct. Check the name and dose of your medication to make sure it is accurate. Check the number of pills, and the written instructions to make sure they are clear and accurate. Make sure that you are given enough medication to last until your next medication refill appointment. 5. Taking Medication: Take medication as prescribed. Never take more pills than instructed. Never take medication more frequently than prescribed. Taking less pills or less frequently is permitted and encouraged, when it comes to controlled substances (written prescriptions).  6. Inform other Doctors: Always inform, all of your healthcare providers, of all the medications you take. 7. Pain Medication from other Providers: You are not allowed to accept any additional pain medication from any other Doctor or Healthcare provider. There are two exceptions to this rule. (see below) In the event that you require additional pain medication, you are responsible for notifying us, as stated below. 8. Medication Agreement: You are responsible for carefully reading and following our Medication Agreement. This must be signed before receiving any prescriptions from our practice. Safely store a copy of your signed Agreement. Violations to the Agreement will result in no further prescriptions. (Additional  copies of our Medication Agreement are available upon request.) 9. Laws, Rules, & Regulations: All patients are expected to follow all Federal and [employer], [employer], Rules, [employer] [facility]. Ignorance of the Laws does not constitute a valid excuse. The use of any illegal substances is prohibited. 10. Adopted CDC guidelines & recommendations: Target dosing levels will be at or below 60 MME/day. Use of benzodiazepines** is not recommended.  Exceptions: There are only two exceptions to the rule of not receiving pain medications from other Healthcare Providers. 1. Exception #1 (Emergencies): In the event of an emergency (i.e.: accident requiring emergency care), you are allowed to receive additional pain medication. However, you are responsible for: As soon as you are able, call our office [phone], at any time of the day or night, and leave a message stating your name, the date and nature of the emergency, and the name and dose of the medication prescribed. In the event that your call is answered by a member of our staff, make sure to document and save the date, time, and the name of the person that took your information.  2. Exception #2 (Planned Surgery): In the event that you are scheduled by another doctor or dentist to have any type of surgery or procedure, you are allowed (for a period no longer than 30 days), to receive additional pain medication, for the acute post-op pain. However, in this case, you are responsible for picking up a copy of our "Post-op Pain Management for Surgeons" handout, and giving it to your surgeon or dentist. This document is available at our office, and does not require an appointment to obtain it. Simply go to our office during business hours (Monday-Thursday from 8:00 AM to 4:00 PM) (Friday 8:00 AM to 12:00 Noon) or if you have a scheduled appointment with [place], prior to your surgery, and ask for it by name. In addition, you will need to provide [place] with your name, name of  your surgeon, type of surgery, and date of procedure or surgery.  *Opioid medications include: morphine, codeine, oxycodone, oxymorphone, hydrocodone, hydromorphone, meperidine, tramadol, tapentadol, buprenorphine, fentanyl, methadone. **Benzodiazepine medications include: diazepam (Valium), alprazolam (Xanax), clonazepam (Klonopine), lorazepam (Ativan), clorazepate (Tranxene), chlordiazepoxide (Librium), estazolam (Prosom), oxazepam (Serax), temazepam (Restoril), triazolam (Halcion) (Last updated: 12/21/2017)  ____________________________________________________________________________________________    BMI Assessment: Estimated body mass index is 23.23 kg/m as calculated from the following:   Height as of this encounter: '4\' 11"'$  (1.499 m).   Weight as of this encounter: 115 lb (52.2 kg).  BMI interpretation table: BMI level Category Range association with higher incidence of chronic pain  <18 kg/m2 Underweight   18.5-24.9 kg/m2 Ideal body weight   25-29.9 kg/m2 Overweight Increased incidence by 20%  30-34.9 kg/m2 Obese (Class I) Increased incidence by 68%  35-39.9 kg/m2 Severe obesity (Class II) Increased incidence by 136%  >40 kg/m2 Extreme obesity (Class III) Increased incidence by 254%   BMI Readings from Last 4 Encounters:  01/31/18 23.23 kg/m  01/04/18 23.23 kg/m  01/01/18 23.03 kg/m  12/27/17 23.03 kg/m   Wt Readings from Last 4 Encounters:  01/31/18 115 lb (52.2 kg)  01/04/18 115 lb (52.2 kg)  12/27/17 114 lb (51.7 kg)  12/22/17 114 lb (51.7 kg)

## 2018-01-31 NOTE — Patient Instructions (Addendum)
You have been given 3 Rx for Hydrocodone today and 1 Rx for Lyrica. ____________________________________________________________________________________________  Medication Rules  Applies to: All patients receiving prescriptions (written or electronic).  Pharmacy of record: Pharmacy where electronic prescriptions will be sent. If written prescriptions are taken to a different pharmacy, please inform the nursing staff. The pharmacy listed in the electronic medical record should be the one where you would like electronic prescriptions to be sent.  Prescription refills: Only during scheduled appointments. Applies to both, written and electronic prescriptions.  NOTE: The following applies primarily to controlled substances (Opioid* Pain Medications).   Patient's responsibilities: 1. Pain Pills: Bring all pain pills to every appointment (except for procedure appointments). 2. Pill Bottles: Bring pills in original pharmacy bottle. Always bring newest bottle. Bring bottle, even if empty. 3. Medication refills: You are responsible for knowing and keeping track of what medications you need refilled. The day before your appointment, write a list of all prescriptions that need to be refilled. Bring that list to your appointment and give it to the admitting nurse. Prescriptions will be written only during appointments. If you forget a medication, it will not be "Called in", "Faxed", or "electronically sent". You will need to get another appointment to get these prescribed. 4. Prescription Accuracy: You are responsible for carefully inspecting your prescriptions before leaving our office. Have the discharge nurse carefully go over each prescription with you, before taking them home. Make sure that your name is accurately spelled, that your address is correct. Check the name and dose of your medication to make sure it is accurate. Check the number of pills, and the written instructions to make sure they are clear  and accurate. Make sure that you are given enough medication to last until your next medication refill appointment. 5. Taking Medication: Take medication as prescribed. Never take more pills than instructed. Never take medication more frequently than prescribed. Taking less pills or less frequently is permitted and encouraged, when it comes to controlled substances (written prescriptions).  6. Inform other Doctors: Always inform, all of your healthcare providers, of all the medications you take. 7. Pain Medication from other Providers: You are not allowed to accept any additional pain medication from any other Doctor or Healthcare provider. There are two exceptions to this rule. (see below) In the event that you require additional pain medication, you are responsible for notifying us, as stated below. 8. Medication Agreement: You are responsible for carefully reading and following our Medication Agreement. This must be signed before receiving any prescriptions from our practice. Safely store a copy of your signed Agreement. Violations to the Agreement will result in no further prescriptions. (Additional copies of our Medication Agreement are available upon request.) 9. Laws, Rules, & Regulations: All patients are expected to follow all [address] and [employer], Rules, [employer]. Ignorance of the Laws does not constitute a valid excuse. The use of any illegal substances is prohibited. 10. Adopted CDC guidelines & recommendations: Target dosing levels will be at or below 60 MME/day. Use of benzodiazepines** is not recommended.  Exceptions: There are only two exceptions to the rule of not receiving pain medications from other Healthcare Providers. 1. Exception #1 (Emergencies): In the event of an emergency (i.e.: accident requiring emergency care), you are allowed to receive additional pain medication. However, you are responsible for: As soon as you are able, call our office [phone], at any  time of the day or night, and leave a message stating your name, the  date and nature of the emergency, and the name and dose of the medication prescribed. In the event that your call is answered by a member of our staff, make sure to document and save the date, time, and the name of the person that took your information.  2. Exception #2 (Planned Surgery): In the event that you are scheduled by another doctor or dentist to have any type of surgery or procedure, you are allowed (for a period no longer than 30 days), to receive additional pain medication, for the acute post-op pain. However, in this case, you are responsible for picking up a copy of our "Post-op Pain Management for Surgeons" handout, and giving it to your surgeon or dentist. This document is available at our office, and does not require an appointment to obtain it. Simply go to our office during business hours (Monday-Thursday from 8:00 AM to 4:00 PM) (Friday 8:00 AM to 12:00 Noon) or if you have a scheduled appointment with us, prior to your surgery, and ask for it by name. In addition, you will need to provide us with your name, name of your surgeon, type of surgery, and date of procedure or surgery.  *Opioid medications include: morphine, codeine, oxycodone, oxymorphone, hydrocodone, hydromorphone, meperidine, tramadol, tapentadol, buprenorphine, fentanyl, methadone. **Benzodiazepine medications include: diazepam (Valium), alprazolam (Xanax), clonazepam (Klonopine), lorazepam (Ativan), clorazepate (Tranxene), chlordiazepoxide (Librium), estazolam (Prosom), oxazepam (Serax), temazepam (Restoril), triazolam (Halcion) (Last updated: 12/21/2017) ____________________________________________________________________________________________    BMI Assessment: Estimated body mass index is 23.23 kg/m as calculated from the following:   Height as of this encounter: 4\' 11"  (1.499 m).   Weight as of this encounter: 115 lb (52.2 kg).  BMI  interpretation table: BMI level Category Range association with higher incidence of chronic pain  <18 kg/m2 Underweight   18.5-24.9 kg/m2 Ideal body weight   25-29.9 kg/m2 Overweight Increased incidence by 20%  30-34.9 kg/m2 Obese (Class I) Increased incidence by 68%  35-39.9 kg/m2 Severe obesity (Class II) Increased incidence by 136%  >40 kg/m2 Extreme obesity (Class III) Increased incidence by 254%   BMI Readings from Last 4 Encounters:  01/31/18 23.23 kg/m  01/04/18 23.23 kg/m  01/01/18 23.03 kg/m  12/27/17 23.03 kg/m   Wt Readings from Last 4 Encounters:  01/31/18 115 lb (52.2 kg)  01/04/18 115 lb (52.2 kg)  12/27/17 114 lb (51.7 kg)  12/22/17 114 lb (51.7 kg)

## 2018-02-02 DIAGNOSIS — M79641 Pain in right hand: Secondary | ICD-10-CM | POA: Diagnosis not present

## 2018-02-02 DIAGNOSIS — H401131 Primary open-angle glaucoma, bilateral, mild stage: Secondary | ICD-10-CM | POA: Diagnosis not present

## 2018-02-05 DIAGNOSIS — H401131 Primary open-angle glaucoma, bilateral, mild stage: Secondary | ICD-10-CM | POA: Diagnosis not present

## 2018-04-03 ENCOUNTER — Ambulatory Visit (INDEPENDENT_AMBULATORY_CARE_PROVIDER_SITE_OTHER): Payer: Medicare Other | Admitting: Physician Assistant

## 2018-04-03 ENCOUNTER — Encounter: Payer: Self-pay | Admitting: Physician Assistant

## 2018-04-03 VITALS — BP 140/70 | HR 88 | Temp 98.1°F | Resp 16 | Wt 109.4 lb

## 2018-04-03 DIAGNOSIS — K219 Gastro-esophageal reflux disease without esophagitis: Secondary | ICD-10-CM

## 2018-04-03 DIAGNOSIS — J449 Chronic obstructive pulmonary disease, unspecified: Secondary | ICD-10-CM | POA: Diagnosis not present

## 2018-04-03 DIAGNOSIS — G2581 Restless legs syndrome: Secondary | ICD-10-CM

## 2018-04-03 DIAGNOSIS — E876 Hypokalemia: Secondary | ICD-10-CM | POA: Diagnosis not present

## 2018-04-03 DIAGNOSIS — E559 Vitamin D deficiency, unspecified: Secondary | ICD-10-CM

## 2018-04-03 DIAGNOSIS — I10 Essential (primary) hypertension: Secondary | ICD-10-CM

## 2018-04-03 DIAGNOSIS — I701 Atherosclerosis of renal artery: Secondary | ICD-10-CM | POA: Diagnosis not present

## 2018-04-03 DIAGNOSIS — E7800 Pure hypercholesterolemia, unspecified: Secondary | ICD-10-CM

## 2018-04-03 MED ORDER — ROPINIROLE HCL ER 2 MG PO TB24: 2.0000 mg | ORAL_TABLET | Freq: Every day | ORAL | 3 refills | Status: DC

## 2018-04-03 MED ORDER — OMEPRAZOLE 40 MG PO CPDR: 40.0000 mg | DELAYED_RELEASE_CAPSULE | Freq: Every day | ORAL | 0 refills | Status: DC

## 2018-04-03 MED ORDER — DILTIAZEM HCL 120 MG PO TABS: 120.0000 mg | ORAL_TABLET | Freq: Every day | ORAL | 3 refills | Status: DC

## 2018-04-03 MED ORDER — POTASSIUM CHLORIDE 25 MEQ PO PACK: 1.0000 | PACK | Freq: Every day | ORAL | 1 refills | Status: DC

## 2018-04-03 MED ORDER — ATORVASTATIN CALCIUM 40 MG PO TABS: 40.0000 mg | ORAL_TABLET | Freq: Every day | ORAL | 3 refills | Status: DC

## 2018-04-03 MED ORDER — VITAMIN D 1000 UNITS PO TABS: 1000.0000 [IU] | ORAL_TABLET | Freq: Every day | ORAL | 3 refills | Status: DC

## 2018-04-03 MED ORDER — RAMIPRIL 10 MG PO CAPS: 10.0000 mg | ORAL_CAPSULE | Freq: Every day | ORAL | 3 refills | Status: DC

## 2018-04-03 MED ORDER — FLUTICASONE-SALMETEROL 250-50 MCG/DOSE IN AEPB: INHALATION_SPRAY | RESPIRATORY_TRACT | 3 refills | Status: DC

## 2018-04-03 MED ORDER — SPIRONOLACTONE 25 MG PO TABS: 25.0000 mg | ORAL_TABLET | Freq: Every day | ORAL | 3 refills | Status: DC

## 2018-04-03 NOTE — Progress Notes (Signed)
Patient: Suzanne Avila Adult    DOB: [birth year]   72 y.o.   MRN: [id number] Visit Date: 04/03/2018  Today's Provider: Margaretann Loveless, PA-C   Chief Complaint  Patient presents with  . Follow-up    HTN   Subjective:    HPI  Hypertension, follow-up:  BP Readings from Last 3 Encounters:  04/03/18 140/70  01/31/18 (!) 163/84  01/01/18 (!) 164/84    She was last seen for hypertension 3 months ago.  BP at that visit was 164/84. Management since that visit includes none. Continue current medication. She reports excellent compliance with treatment. She is not having side effects.  She is not exercising. She is adherent to low salt diet.   Outside blood pressures are n/a. She is experiencing fatigue.  Patient denies chest pain, chest pressure/discomfort, exertional chest pressure/discomfort, irregular heart beat, lower extremity edema, near-syncope and palpitations.   Cardiovascular risk factors include advanced age (older than 40 for men, 53 for women), hypertension, smoking/ tobacco exposure and .    Weight trend: decreasing steadily Wt Readings from Last 3 Encounters:  04/03/18 109 lb 6.4 oz (49.6 kg)  01/31/18 115 lb (52.2 kg)  01/04/18 115 lb (52.2 kg)    Current diet: in general, an "unhealthy" diet, reports that she really doesn't eat ------------------------------------------------------------------------     Allergies  Allergen Reactions  . Codeine Itching  . Cyclobenzaprine Itching  . Penicillins     itching     Current Outpatient Medications:  .  albuterol (PROVENTIL HFA;VENTOLIN HFA) 108 (90 Base) MCG/ACT inhaler, ALBUTEROL, 90MCG/ACT (Inhalation Aerosol Solution)  2 PUFFS Every Day, As Needed for 0 days  Quantity: 0.00;  Refills: 0   Ordered :09-Jan-2014  Denna Haggard, MA, [place] ;  Started 10-Aug-2009 Active Comments: Medication taken as needed. , Disp: , Rfl:  .  aspirin 325 MG tablet, Take 325 mg by mouth daily., Disp: , Rfl:  .   atorvastatin (LIPITOR) 40 MG tablet, Take 1 tablet (40 mg total) by mouth daily., Disp: 90 tablet, Rfl: 3 .  cholecalciferol (VITAMIN D) 1000 units tablet, Take 1 tablet (1,000 Units total) by mouth daily., Disp: 90 tablet, Rfl: 3 .  diltiazem (CARDIZEM) 120 MG tablet, Take 1 tablet (120 mg total) by mouth daily., Disp: 90 tablet, Rfl: 3 .  dorzolamide (TRUSOPT) 2 % ophthalmic solution, , Disp: , Rfl:  .  Fluticasone-Salmeterol (ADVAIR DISKUS) 250-50 MCG/DOSE AEPB, USE 1 INHALATION TWICE A DAY, Disp: 180 each, Rfl: 3 .  [START ON 04/12/2018] HYDROcodone-acetaminophen (NORCO) 7.5-325 MG tablet, Take 1 tablet by mouth every 6 (six) hours as needed for severe pain., Disp: 120 tablet, Rfl: 0 .  latanoprost (XALATAN) 0.005 % ophthalmic solution, 1 drop at bedtime., Disp: , Rfl:  .  meloxicam (MOBIC) 15 MG tablet, Take 1 tablet (15 mg total) by mouth daily., Disp: 90 tablet, Rfl: 0 .  pregabalin (LYRICA) 150 MG capsule, Take 1 capsule (150 mg total) by mouth every 8 (eight) hours., Disp: 270 capsule, Rfl: 0 .  ramipril (ALTACE) 10 MG capsule, Take 1 capsule (10 mg total) by mouth daily., Disp: 90 capsule, Rfl: 3 .  rOPINIRole (REQUIP XL) 2 MG 24 hr tablet, Take 1 tablet (2 mg total) by mouth at bedtime., Disp: 90 tablet, Rfl: 0 .  spironolactone (ALDACTONE) 25 MG tablet, Take 1 tablet (25 mg total) by mouth daily., Disp: 90 tablet, Rfl: 3 .  vitamin B-12 (CYANOCOBALAMIN) 500 MCG tablet, Take 500 mcg by mouth  daily., Disp: , Rfl:  .  HYDROcodone-acetaminophen (NORCO) 7.5-325 MG tablet, Take 1 tablet by mouth every 6 (six) hours as needed for severe pain., Disp: 120 tablet, Rfl: 0 .  HYDROcodone-acetaminophen (NORCO) 7.5-325 MG tablet, Take 1 tablet by mouth every 6 (six) hours as needed for severe pain., Disp: 120 tablet, Rfl: 0 .  potassium chloride 20 MEQ/15ML (10%) SOLN, Take 15 mLs (20 mEq total) by mouth daily. (Patient not taking: Reported on 04/03/2018), Disp: 450 mL, Rfl: 0  Review of Systems    Constitutional: Positive for fatigue.  Respiratory: Negative.   Cardiovascular: Negative for chest pain, palpitations and leg swelling.  Gastrointestinal: Positive for abdominal pain.  Neurological: Negative.     Social History   Tobacco Use  . Smoking status: Current Every Day Smoker    Packs/day: 1.00    Years: 50.00    Pack years: 50.00    Types: Cigarettes  . Smokeless tobacco: Never Used  Substance Use Topics  . Alcohol use: Yes    Alcohol/week: 25.2 oz    Types: 42 Cans of beer per week   Objective:   BP 140/70 (BP Location: Left Arm, Patient Position: Sitting, Cuff Size: Normal)   Pulse 88   Temp 98.1 F (36.7 C) (Oral)   Resp 16   Wt 109 lb 6.4 oz (49.6 kg)   SpO2 96%   BMI 22.10 kg/m    Physical Exam  Constitutional: She appears well-developed and well-nourished. No distress.  Neck: Normal range of motion. Neck supple. No JVD present. No tracheal deviation present. No thyromegaly present.  Cardiovascular: Normal rate, regular rhythm and normal heart sounds. Exam reveals no gallop and no friction rub.  No murmur heard. Pulmonary/Chest: Effort normal and breath sounds normal. No respiratory distress. She has no wheezes. She has no rales.  Abdominal: Soft. Bowel sounds are normal. There is no tenderness.  Lymphadenopathy:    She has no cervical adenopathy.  Skin: She is not diaphoretic.  Vitals reviewed.       Assessment & Plan:     1. Benign essential HTN Improved reading today. Continue Spironolactone 25mg , ramipril 10mg , diltiazem 120mg  daily.   2. Gastroesophageal reflux disease without esophagitis Suspected worsening GERD due to increased NSAID use. Omeprazole will be started as below. Call if no improvements.  - omeprazole (PRILOSEC) 40 MG capsule; Take 1 capsule (40 mg total) by mouth daily.  Dispense: 90 capsule; Refill: 0  3. Hypokalemia Stable. Diagnosis pulled for medication refill. Continue current medical treatment plan. - Potassium  Chloride 25 MEQ PACK; Take 1 each by mouth daily.  Dispense: 90 each; Refill: 1  4. Restless legs syndrome (RLS) Stable. Diagnosis pulled for medication refill. Continue current medical treatment plan. - rOPINIRole (REQUIP XL) 2 MG 24 hr tablet; Take 1 tablet (2 mg total) by mouth at bedtime.  Dispense: 90 tablet; Refill: 3  5. Essential hypertension Stable. Diagnosis pulled for medication refill. Continue current medical treatment plan. - spironolactone (ALDACTONE) 25 MG tablet; Take 1 tablet (25 mg total) by mouth daily.  Dispense: 90 tablet; Refill: 3 - ramipril (ALTACE) 10 MG capsule; Take 1 capsule (10 mg total) by mouth daily.  Dispense: 90 capsule; Refill: 3 - diltiazem (CARDIZEM) 120 MG tablet; Take 1 tablet (120 mg total) by mouth daily.  Dispense: 90 tablet; Refill: 3  6. Chronic obstructive pulmonary disease, unspecified COPD type (HCC) Stable. Diagnosis pulled for medication refill. Continue current medical treatment plan. - Fluticasone-Salmeterol (ADVAIR DISKUS) 250-50 MCG/DOSE AEPB; USE  1 INHALATION TWICE A DAY  Dispense: 180 each; Refill: 3  7. Vitamin D deficiency Stable. Diagnosis pulled for medication refill. Continue current medical treatment plan. - cholecalciferol (VITAMIN D) 1000 units tablet; Take 1 tablet (1,000 Units total) by mouth daily.  Dispense: 90 tablet; Refill: 3  8. Renal artery stenosis (HCC) Stable. Returns in 6 months for recheck with [facility].  - atorvastatin (LIPITOR) 40 MG tablet; Take 1 tablet (40 mg total) by mouth daily.  Dispense: 90 tablet; Refill: 3  9. Pure hypercholesterolemia Stable. Diagnosis pulled for medication refill. Continue current medical treatment plan. - atorvastatin (LIPITOR) 40 MG tablet; Take 1 tablet (40 mg total) by mouth daily.  Dispense: 90 tablet; Refill: 3       Margaretann Loveless, PA-C  [facility]

## 2018-04-03 NOTE — Patient Instructions (Signed)
DASH Eating Plan DASH stands for "Dietary Approaches to Stop Hypertension." The DASH eating plan is a healthy eating plan that has been shown to reduce high blood pressure (hypertension). It may also reduce your risk for type 2 diabetes, heart disease, and stroke. The DASH eating plan may also help with weight loss. What are tips for following this plan? General guidelines  Avoid eating more than 2,300 mg (milligrams) of salt (sodium) a day. If you have hypertension, you may need to reduce your sodium intake to 1,500 mg a day.  Limit alcohol intake to no more than 1 drink a day for nonpregnant women and 2 drinks a day for men. One drink equals 12 oz of beer, 5 oz of wine, or 1 oz of hard liquor.  Work with your health care provider to maintain a healthy body weight or to lose weight. Ask what an ideal weight is for you.  Get at least 30 minutes of exercise that causes your heart to beat faster (aerobic exercise) most days of the week. Activities may include walking, swimming, or biking.  Work with your health care provider or diet and nutrition specialist (dietitian) to adjust your eating plan to your individual calorie needs. Reading food labels  Check food labels for the amount of sodium per serving. Choose foods with less than 5 percent of the Daily Value of sodium. Generally, foods with less than 300 mg of sodium per serving fit into this eating plan.  To find whole grains, look for the word "whole" as the first word in the ingredient list. Shopping  Buy products labeled as "low-sodium" or "no salt added."  Buy fresh foods. Avoid canned foods and premade or frozen meals. Cooking  Avoid adding salt when cooking. Use salt-free seasonings or herbs instead of table salt or sea salt. Check with your health care provider or pharmacist before using salt substitutes.  Do not fry foods. Cook foods using healthy methods such as baking, boiling, grilling, and broiling instead.  Cook with  heart-healthy oils, such as olive, canola, soybean, or sunflower oil. Meal planning   Eat a balanced diet that includes: ? 5 or more servings of fruits and vegetables each day. At each meal, try to fill half of your plate with fruits and vegetables. ? Up to 6-8 servings of whole grains each day. ? Less than 6 oz of lean meat, poultry, or fish each day. A 3-oz serving of meat is about the same size as a deck of cards. One egg equals 1 oz. ? 2 servings of low-fat dairy each day. ? A serving of nuts, seeds, or beans 5 times each week. ? Heart-healthy fats. Healthy fats called Omega-3 fatty acids are found in foods such as flaxseeds and coldwater fish, like sardines, salmon, and mackerel.  Limit how much you eat of the following: ? Canned or prepackaged foods. ? Food that is high in trans fat, such as fried foods. ? Food that is high in saturated fat, such as fatty meat. ? Sweets, desserts, sugary drinks, and other foods with added sugar. ? Full-fat dairy products.  Do not salt foods before eating.  Try to eat at least 2 vegetarian meals each week.  Eat more home-cooked food and less restaurant, buffet, and fast food.  When eating at a restaurant, ask that your food be prepared with less salt or no salt, if possible. What foods are recommended? The items listed may not be a complete list. Talk with your dietitian about what   dietary choices are best for you. Grains Whole-grain or whole-wheat bread. Whole-grain or whole-wheat pasta. Brown rice. Oatmeal. Quinoa. Bulgur. Whole-grain and low-sodium cereals. Pita bread. Low-fat, low-sodium crackers. Whole-wheat flour tortillas. Vegetables Fresh or frozen vegetables (raw, steamed, roasted, or grilled). Low-sodium or reduced-sodium tomato and vegetable juice. Low-sodium or reduced-sodium tomato sauce and tomato paste. Low-sodium or reduced-sodium canned vegetables. Fruits All fresh, dried, or frozen fruit. Canned fruit in natural juice (without  added sugar). Meat and other protein foods Skinless chicken or turkey. Ground chicken or turkey. Pork with fat trimmed off. Fish and seafood. Egg whites. Dried beans, peas, or lentils. Unsalted nuts, nut butters, and seeds. Unsalted canned beans. Lean cuts of beef with fat trimmed off. Low-sodium, lean deli meat. Dairy Low-fat (1%) or fat-free (skim) milk. Fat-free, low-fat, or reduced-fat cheeses. Nonfat, low-sodium ricotta or cottage cheese. Low-fat or nonfat yogurt. Low-fat, low-sodium cheese. Fats and oils Soft margarine without trans fats. Vegetable oil. Low-fat, reduced-fat, or light mayonnaise and salad dressings (reduced-sodium). Canola, safflower, olive, soybean, and sunflower oils. Avocado. Seasoning and other foods Herbs. Spices. Seasoning mixes without salt. Unsalted popcorn and pretzels. Fat-free sweets. What foods are not recommended? The items listed may not be a complete list. Talk with your dietitian about what dietary choices are best for you. Grains Baked goods made with fat, such as croissants, muffins, or some breads. Dry pasta or rice meal packs. Vegetables Creamed or fried vegetables. Vegetables in a cheese sauce. Regular canned vegetables (not low-sodium or reduced-sodium). Regular canned tomato sauce and paste (not low-sodium or reduced-sodium). Regular tomato and vegetable juice (not low-sodium or reduced-sodium). Pickles. Olives. Fruits Canned fruit in a light or heavy syrup. Fried fruit. Fruit in cream or butter sauce. Meat and other protein foods Fatty cuts of meat. Ribs. Fried meat. Bacon. Sausage. Bologna and other processed lunch meats. Salami. Fatback. Hotdogs. Bratwurst. Salted nuts and seeds. Canned beans with added salt. Canned or smoked fish. Whole eggs or egg yolks. Chicken or turkey with skin. Dairy Whole or 2% milk, cream, and half-and-half. Whole or full-fat cream cheese. Whole-fat or sweetened yogurt. Full-fat cheese. Nondairy creamers. Whipped toppings.  Processed cheese and cheese spreads. Fats and oils Butter. Stick margarine. Lard. Shortening. Ghee. Bacon fat. Tropical oils, such as coconut, palm kernel, or palm oil. Seasoning and other foods Salted popcorn and pretzels. Onion salt, garlic salt, seasoned salt, table salt, and sea salt. Worcestershire sauce. Tartar sauce. Barbecue sauce. Teriyaki sauce. Soy sauce, including reduced-sodium. Steak sauce. Canned and packaged gravies. Fish sauce. Oyster sauce. Cocktail sauce. Horseradish that you find on the shelf. Ketchup. Mustard. Meat flavorings and tenderizers. Bouillon cubes. Hot sauce and Tabasco sauce. Premade or packaged marinades. Premade or packaged taco seasonings. Relishes. Regular salad dressings. Where to find more information:  National Heart, Lung, and Blood Institute: www.nhlbi.nih.gov  American Heart Association: www.heart.org Summary  The DASH eating plan is a healthy eating plan that has been shown to reduce high blood pressure (hypertension). It may also reduce your risk for type 2 diabetes, heart disease, and stroke.  With the DASH eating plan, you should limit salt (sodium) intake to 2,300 mg a day. If you have hypertension, you may need to reduce your sodium intake to 1,500 mg a day.  When on the DASH eating plan, aim to eat more fresh fruits and vegetables, whole grains, lean proteins, low-fat dairy, and heart-healthy fats.  Work with your health care provider or diet and nutrition specialist (dietitian) to adjust your eating plan to your individual   calorie needs. This information is not intended to replace advice given to you by your health care provider. Make sure you discuss any questions you have with your health care provider. Document Released: 09/29/2011 Document Revised: 10/03/2016 Document Reviewed: 10/03/2016 Elsevier Interactive Patient Education  2018 [employer]  

## 2018-04-09 ENCOUNTER — Other Ambulatory Visit: Payer: Self-pay | Admitting: Physician Assistant

## 2018-04-09 DIAGNOSIS — E876 Hypokalemia: Secondary | ICD-10-CM

## 2018-04-09 MED ORDER — POTASSIUM CHLORIDE 20 MEQ PO PACK: 20.0000 meq | PACK | Freq: Every day | ORAL | 1 refills | Status: DC

## 2018-04-09 NOTE — Progress Notes (Signed)
Patient prefers potassium packets

## 2018-04-12 IMAGING — CR DG THORACIC SPINE 2V
1 series · 3 of 3 positions shown · non-contrast
Comparison: 02/03/2010

CLINICAL DATA: Back pain for several years, no known injury,
initial encounter

EXAM:
THORACIC SPINE 2 VIEWS

[Series 1: dg thoracic spine 2 view · 0.14mm/px · 3 of 3 slices shown]
[im 1/3]
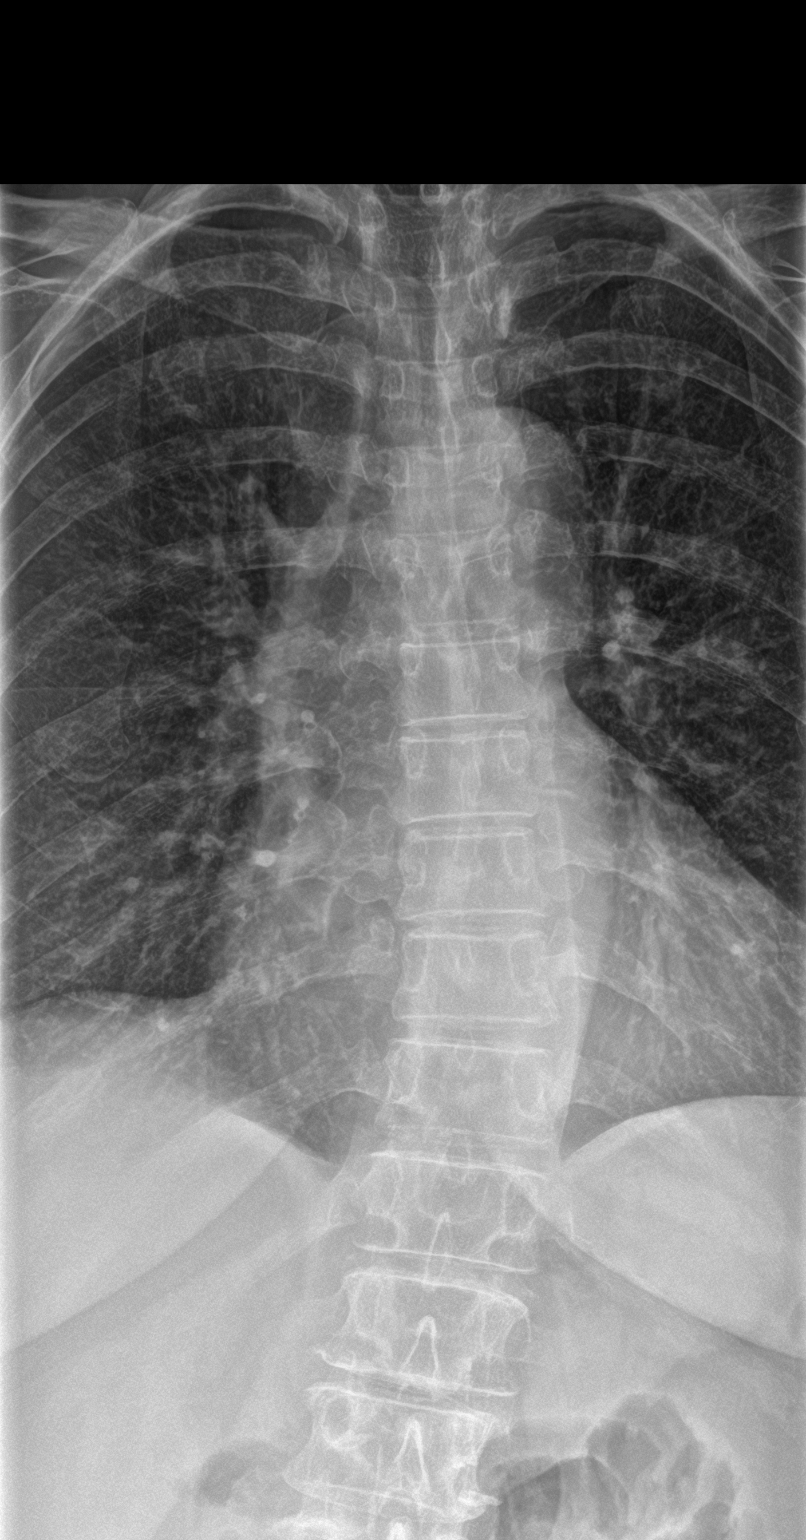
[im 2/3]
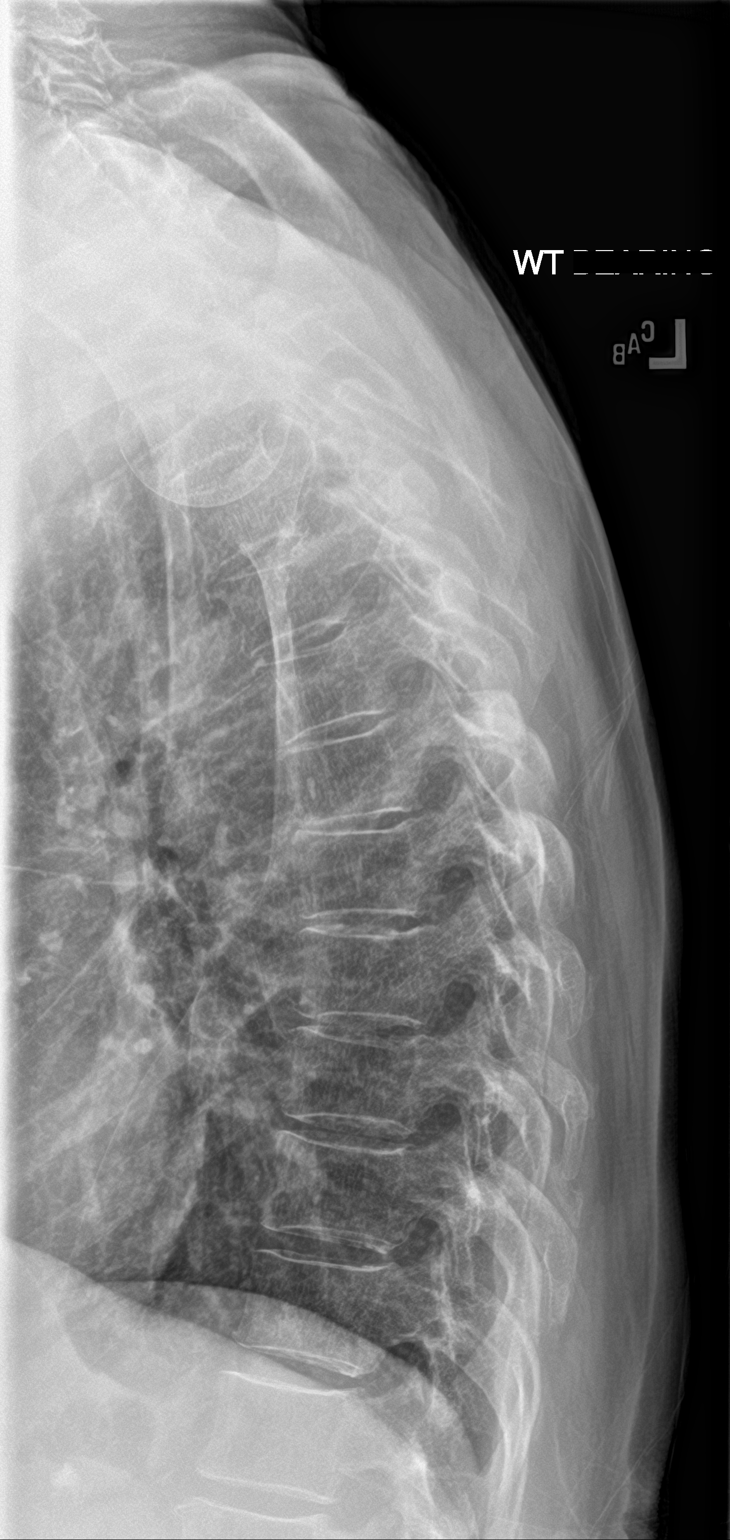
[im 3/3]
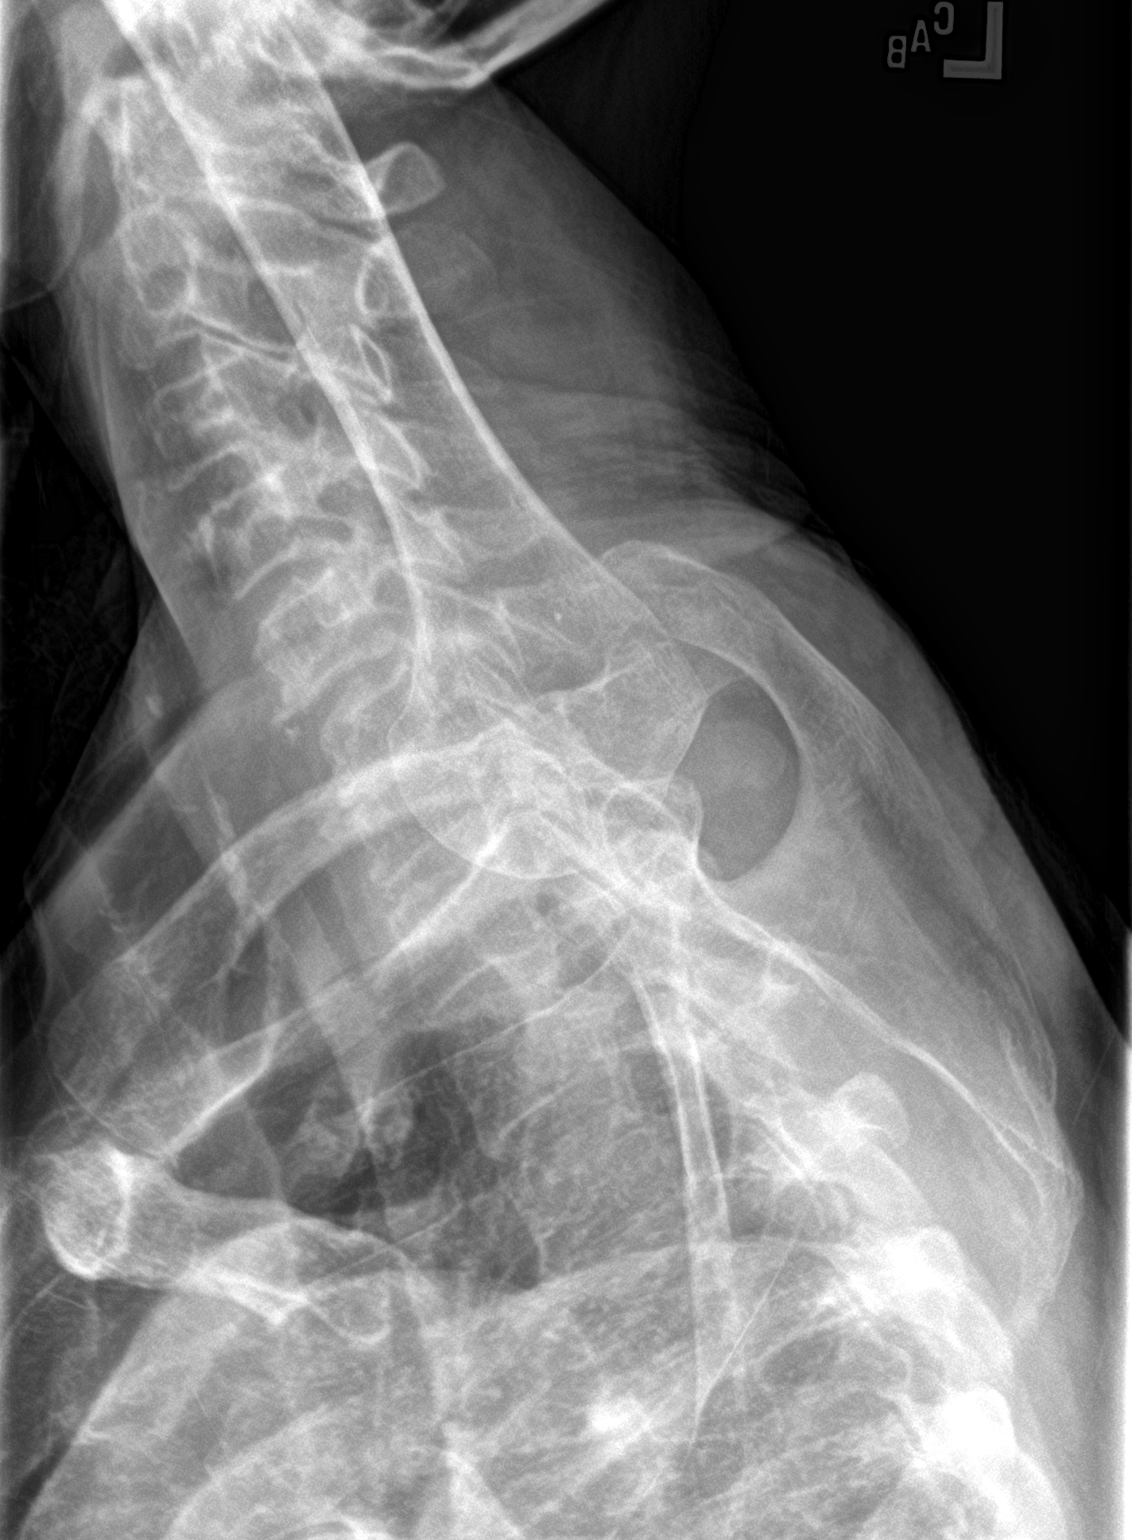

[3 of 3 positions shown; findings below may reference images not displayed]

FINDINGS: Vertebral body height is well maintained. The pedicles are within
normal limits. No paraspinal mass lesion is noted. A mild scoliosis
concave to the right is noted centered about the T10 vertebral body.
IMPRESSION: Mild scoliosis.

## 2018-04-12 IMAGING — CR DG LUMBAR SPINE COMPLETE W/ BEND
1 series · 8 of 8 positions shown · non-contrast
Comparison: CT 11/18/2016.

CLINICAL DATA: Low back pain.  Prior surgery.  No known injury.

EXAM:
LUMBAR SPINE - COMPLETE WITH BENDING VIEWS

[Series 1: dg lumbar spine complete w/bend 6+v · 0.14mm/px · 8 of 8 slices shown]
[im 1/8]
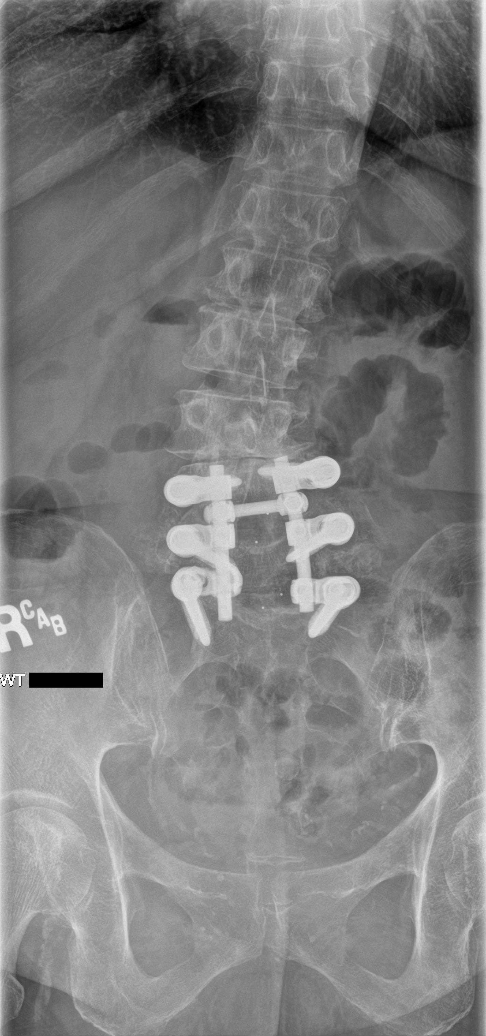
[im 2/8]
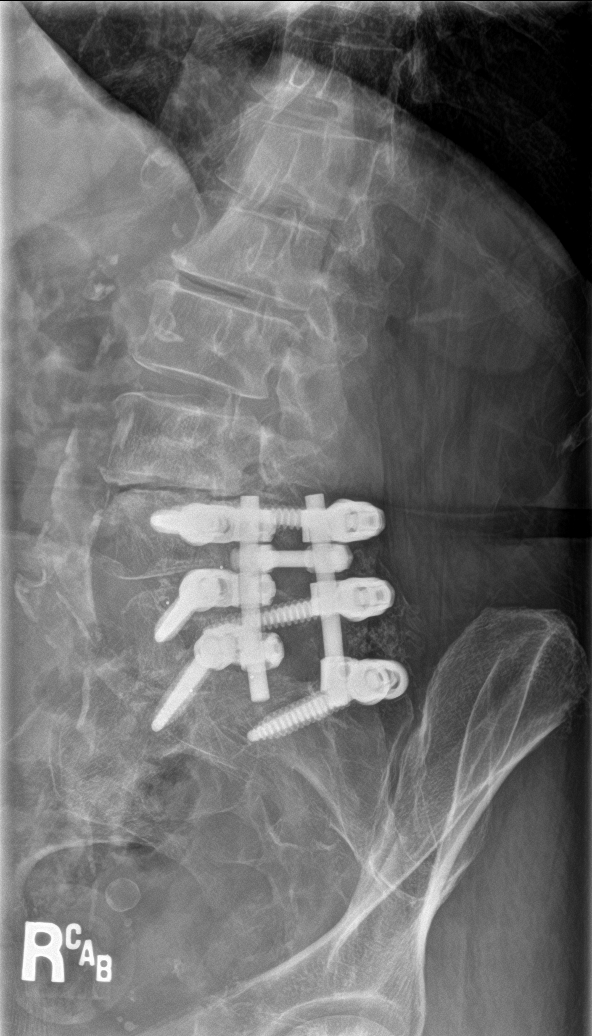
[im 3/8]
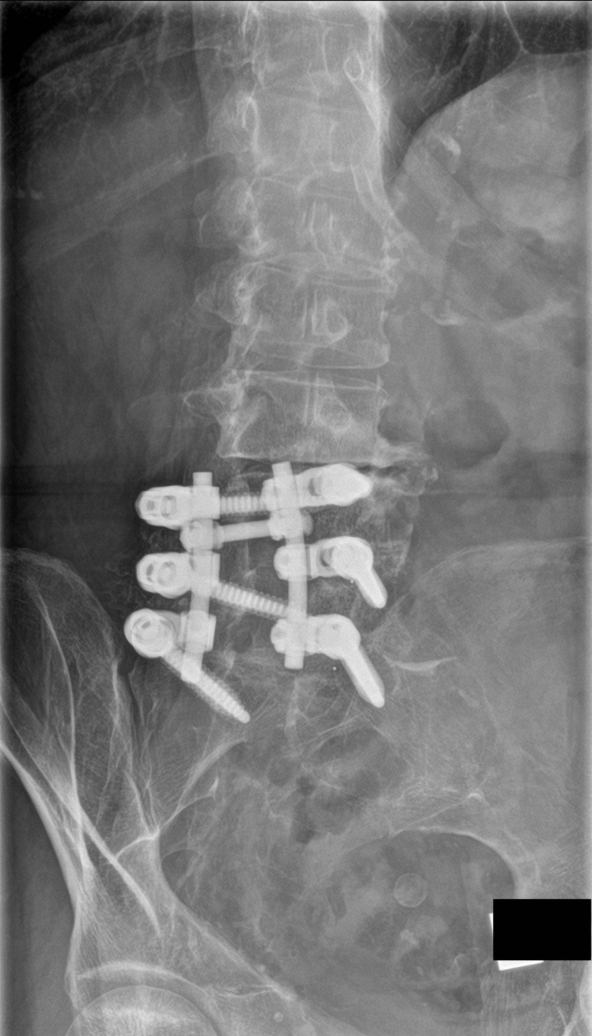
[im 4/8]
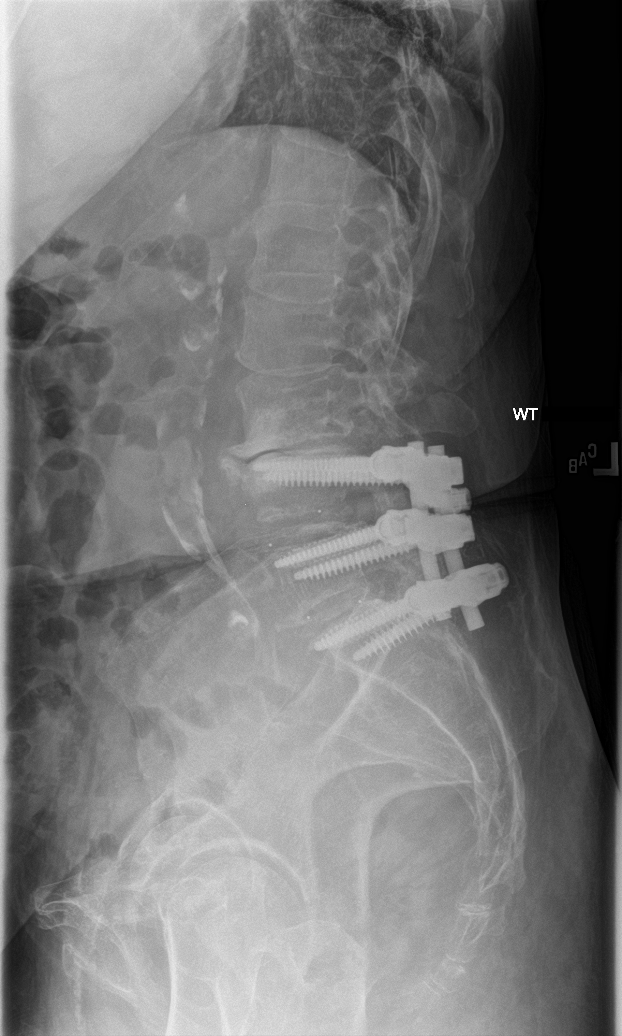
[im 5/8]
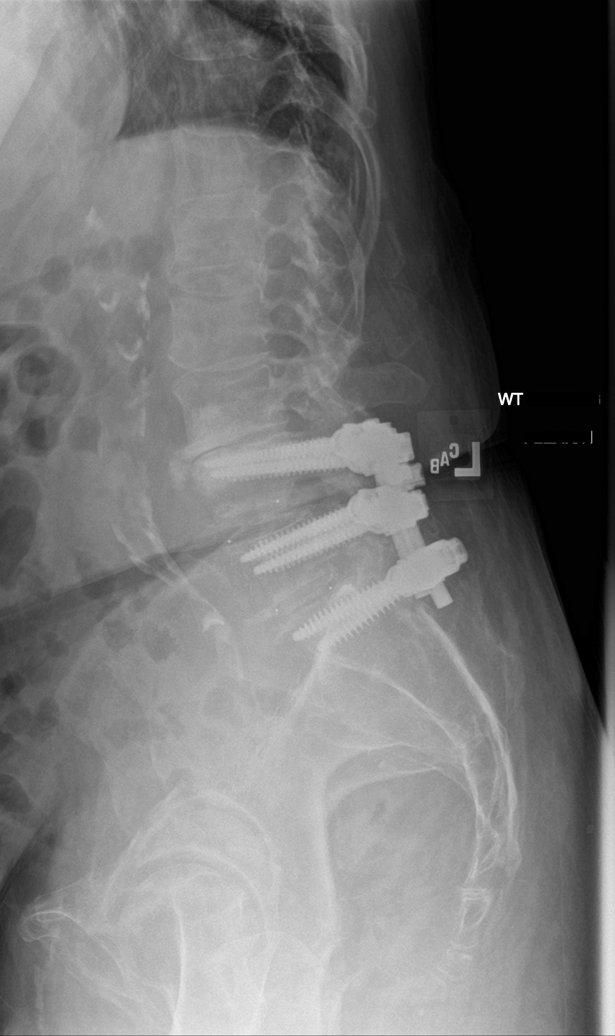
[im 6/8]
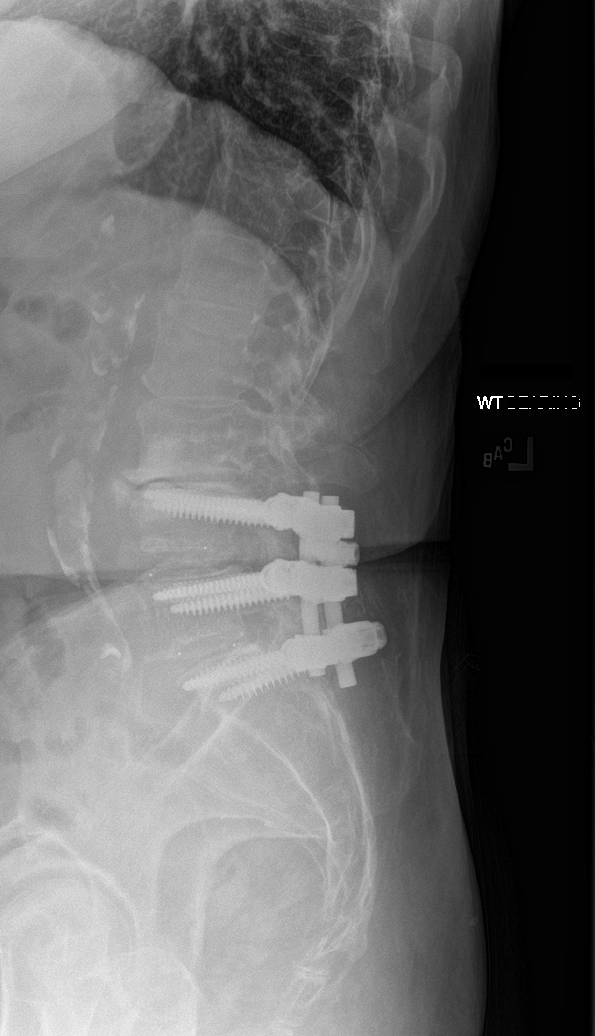
[im 7/8]
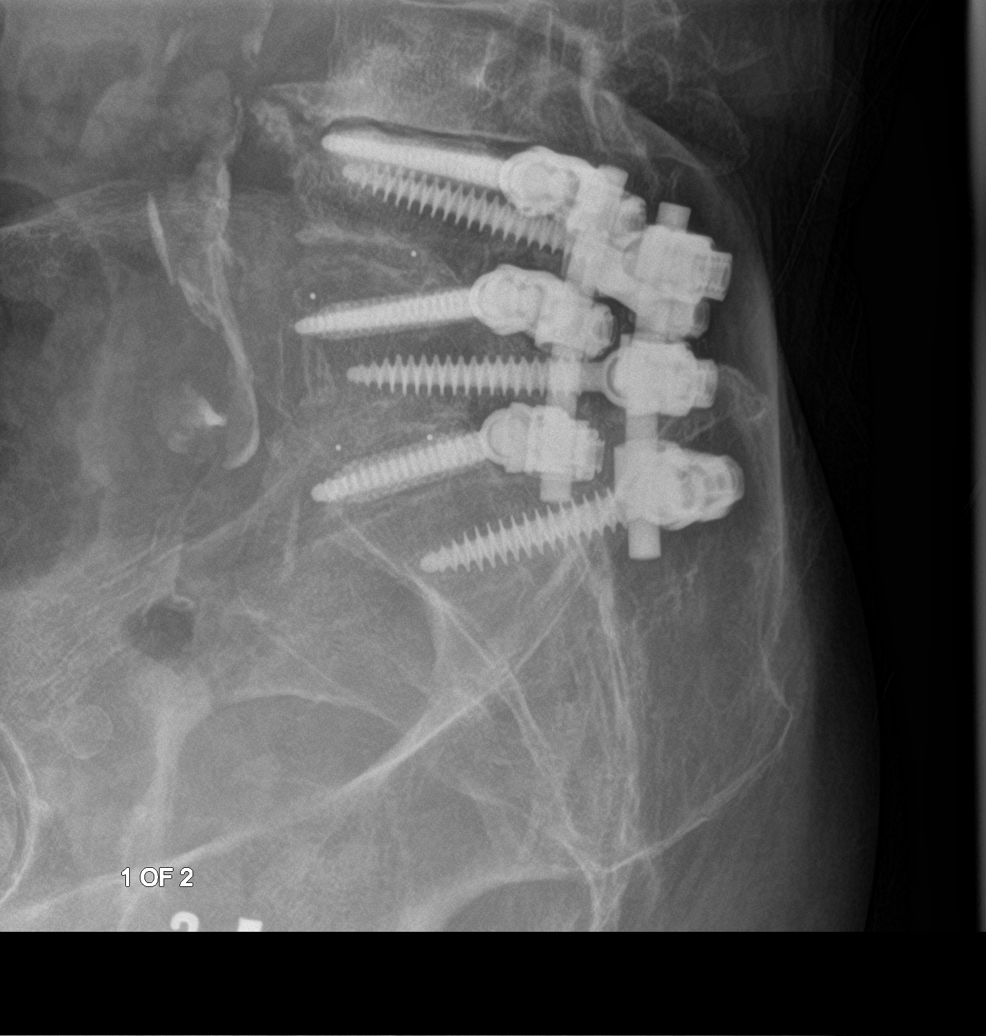
[im 8/8]
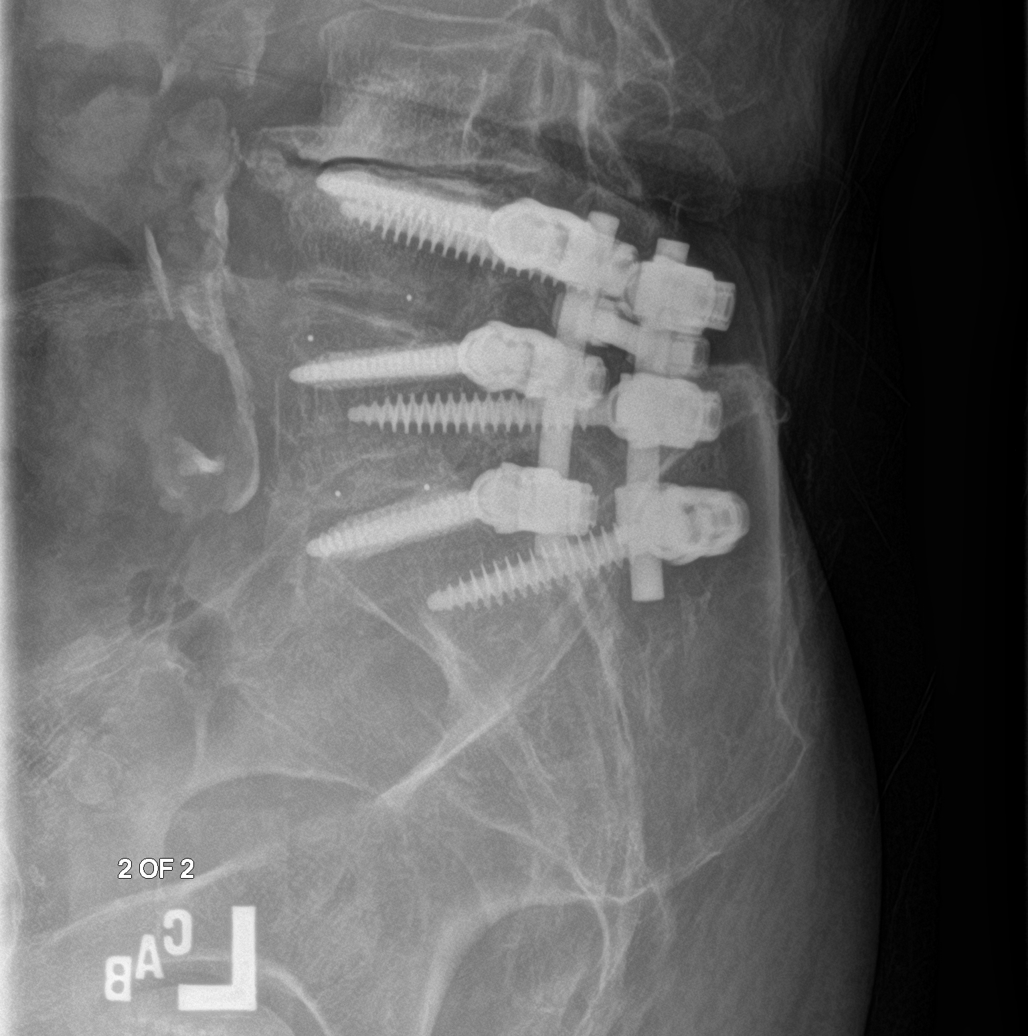

[8 of 8 positions shown; findings below may reference images not displayed]

FINDINGS: Lumbar vertebra numbered as per prior exam. L4 through S1 posterior
and interbody fusion with anatomic alignment. Hardware intact.
Diffuse severe osteopenia and degenerative change. Lumbar scoliosis
concave left. No acute abnormality identified. Aortoiliac
atherosclerotic vascular disease.
IMPRESSION: 1. L4 through S1 posterior and interbody fusion with anatomic
alignment. Hardware intact long

2. Diffuse severe osteopenia degenerative change. Lumbar spine
scoliosis concave left. No acute bony abnormality identified.

3. Aortoiliac atherosclerotic vascular disease.

## 2018-05-02 ENCOUNTER — Other Ambulatory Visit: Payer: Self-pay

## 2018-05-02 ENCOUNTER — Ambulatory Visit: Payer: Medicare Other | Attending: Nurse Practitioner | Admitting: Nurse Practitioner

## 2018-05-02 ENCOUNTER — Encounter: Payer: Self-pay | Admitting: Nurse Practitioner

## 2018-05-02 VITALS — BP 128/76 | HR 75 | Temp 97.6°F | Resp 16 | Ht 59.0 in | Wt 107.0 lb

## 2018-05-02 DIAGNOSIS — F329 Major depressive disorder, single episode, unspecified: Secondary | ICD-10-CM | POA: Diagnosis not present

## 2018-05-02 DIAGNOSIS — F17210 Nicotine dependence, cigarettes, uncomplicated: Secondary | ICD-10-CM | POA: Insufficient documentation

## 2018-05-02 DIAGNOSIS — M1612 Unilateral primary osteoarthritis, left hip: Secondary | ICD-10-CM

## 2018-05-02 DIAGNOSIS — Z880 Allergy status to penicillin: Secondary | ICD-10-CM | POA: Diagnosis not present

## 2018-05-02 DIAGNOSIS — Z5181 Encounter for therapeutic drug level monitoring: Secondary | ICD-10-CM | POA: Insufficient documentation

## 2018-05-02 DIAGNOSIS — M961 Postlaminectomy syndrome, not elsewhere classified: Secondary | ICD-10-CM | POA: Insufficient documentation

## 2018-05-02 DIAGNOSIS — Z7982 Long term (current) use of aspirin: Secondary | ICD-10-CM | POA: Diagnosis not present

## 2018-05-02 DIAGNOSIS — M47816 Spondylosis without myelopathy or radiculopathy, lumbar region: Secondary | ICD-10-CM | POA: Diagnosis not present

## 2018-05-02 DIAGNOSIS — M48061 Spinal stenosis, lumbar region without neurogenic claudication: Secondary | ICD-10-CM | POA: Diagnosis not present

## 2018-05-02 DIAGNOSIS — Z885 Allergy status to narcotic agent status: Secondary | ICD-10-CM | POA: Diagnosis not present

## 2018-05-02 DIAGNOSIS — M12852 Other specific arthropathies, not elsewhere classified, left hip: Secondary | ICD-10-CM | POA: Diagnosis not present

## 2018-05-02 DIAGNOSIS — G894 Chronic pain syndrome: Secondary | ICD-10-CM | POA: Diagnosis not present

## 2018-05-02 DIAGNOSIS — Z79899 Other long term (current) drug therapy: Secondary | ICD-10-CM | POA: Diagnosis not present

## 2018-05-02 DIAGNOSIS — M797 Fibromyalgia: Secondary | ICD-10-CM | POA: Insufficient documentation

## 2018-05-02 DIAGNOSIS — I10 Essential (primary) hypertension: Secondary | ICD-10-CM | POA: Insufficient documentation

## 2018-05-02 DIAGNOSIS — M8580 Other specified disorders of bone density and structure, unspecified site: Secondary | ICD-10-CM | POA: Insufficient documentation

## 2018-05-02 DIAGNOSIS — E785 Hyperlipidemia, unspecified: Secondary | ICD-10-CM | POA: Diagnosis not present

## 2018-05-02 DIAGNOSIS — Z79891 Long term (current) use of opiate analgesic: Secondary | ICD-10-CM | POA: Insufficient documentation

## 2018-05-02 MED ORDER — HYDROCODONE-ACETAMINOPHEN 7.5-325 MG PO TABS: 1.0000 | ORAL_TABLET | Freq: Four times a day (QID) | ORAL | 0 refills | Status: DC | PRN

## 2018-05-02 MED ORDER — PREGABALIN 150 MG PO CAPS: 150.0000 mg | ORAL_CAPSULE | Freq: Three times a day (TID) | ORAL | 0 refills | Status: DC

## 2018-05-02 MED ORDER — MELOXICAM 15 MG PO TABS: 15.0000 mg | ORAL_TABLET | Freq: Every day | ORAL | 0 refills | Status: DC

## 2018-05-02 NOTE — Patient Instructions (Addendum)
Rx for Meloxicam has been escribed to your pharmacy.  You have been give 3 Rx for Hydrocodone with Tylenol and Rx for Lyrica.____________________________________________________________________________________________  Medication Rules  Applies to: All patients receiving prescriptions (written or electronic).  Pharmacy of record: Pharmacy where electronic prescriptions will be sent. If written prescriptions are taken to a different pharmacy, please inform the nursing staff. The pharmacy listed in the electronic medical record should be the one where you would like electronic prescriptions to be sent.  Prescription refills: Only during scheduled appointments. Applies to both, written and electronic prescriptions.  NOTE: The following applies primarily to controlled substances (Opioid* Pain Medications).   Patient's responsibilities: 1. Pain Pills: Bring all pain pills to every appointment (except for procedure appointments). 2. Pill Bottles: Bring pills in original pharmacy bottle. Always bring newest bottle. Bring bottle, even if empty. 3. Medication refills: You are responsible for knowing and keeping track of what medications you need refilled. The day before your appointment, write a list of all prescriptions that need to be refilled. Bring that list to your appointment and give it to the admitting nurse. Prescriptions will be written only during appointments. If you forget a medication, it will not be "Called in", "Faxed", or "electronically sent". You will need to get another appointment to get these prescribed. 4. Prescription Accuracy: You are responsible for carefully inspecting your prescriptions before leaving our office. Have the discharge nurse carefully go over each prescription with you, before taking them home. Make sure that your name is accurately spelled, that your address is correct. Check the name and dose of your medication to make sure it is accurate. Check the number of  pills, and the written instructions to make sure they are clear and accurate. Make sure that you are given enough medication to last until your next medication refill appointment. 5. Taking Medication: Take medication as prescribed. Never take more pills than instructed. Never take medication more frequently than prescribed. Taking less pills or less frequently is permitted and encouraged, when it comes to controlled substances (written prescriptions).  6. Inform other Doctors: Always inform, all of your healthcare providers, of all the medications you take. 7. Pain Medication from other Providers: You are not allowed to accept any additional pain medication from any other Doctor or Healthcare provider. There are two exceptions to this rule. (see below) In the event that you require additional pain medication, you are responsible for notifying us, as stated below. 8. Medication Agreement: You are responsible for carefully reading and following our Medication Agreement. This must be signed before receiving any prescriptions from our practice. Safely store a copy of your signed Agreement. Violations to the Agreement will result in no further prescriptions. (Additional copies of our Medication Agreement are available upon request.) 9. Laws, Rules, & Regulations: All patients are expected to follow all [address]Federal and [place] DisneyState Laws, [employer] IndustriesStatutes, Rules, [place] [employer] [place] [employer]& Regulations. Ignorance of the Laws does not constitute a valid excuse. The use of any illegal substances is prohibited. 10. Adopted CDC guidelines & recommendations: Target dosing levels will be at or below 60 MME/day. Use of benzodiazepines** is not recommended.  Exceptions: There are only two exceptions to the rule of not receiving pain medications from other Healthcare Providers. 1. Exception #1 (Emergencies): In the event of an emergency (i.e.: accident requiring emergency care), you are allowed to receive additional pain medication. However, you are responsible for:  As soon as you are able, call our office [phone], at any time of the day or  night, and leave a message stating your name, the date and nature of the emergency, and the name and dose of the medication prescribed. In the event that your call is answered by a member of our staff, make sure to document and save the date, time, and the name of the person that took your information.  2. Exception #2 (Planned Surgery): In the event that you are scheduled by another doctor or dentist to have any type of surgery or procedure, you are allowed (for a period no longer than 30 days), to receive additional pain medication, for the acute post-op pain. However, in this case, you are responsible for picking up a copy of our "Post-op Pain Management for Surgeons" handout, and giving it to your surgeon or dentist. This document is available at our office, and does not require an appointment to obtain it. Simply go to our office during business hours (Monday-Thursday from 8:00 AM to 4:00 PM) (Friday 8:00 AM to 12:00 Noon) or if you have a scheduled appointment with [place], prior to your surgery, and ask for it by name. In addition, you will need to provide [place] with your name, name of your surgeon, type of surgery, and date of procedure or surgery.  *Opioid medications include: morphine, codeine, oxycodone, oxymorphone, hydrocodone, hydromorphone, meperidine, tramadol, tapentadol, buprenorphine, fentanyl, methadone. **Benzodiazepine medications include: diazepam (Valium), alprazolam (Xanax), clonazepam (Klonopine), lorazepam (Ativan), clorazepate (Tranxene), chlordiazepoxide (Librium), estazolam (Prosom), oxazepam (Serax), temazepam (Restoril), triazolam (Halcion) (Last updated: 12/21/2017) ____________________________________________________________________________________________

## 2018-05-02 NOTE — Progress Notes (Signed)
Nursing Pain Medication Assessment:  Safety precautions to be maintained throughout the outpatient stay will include: orient to surroundings, keep bed in low position, maintain call bell within reach at all times, provide assistance with transfer out of bed and ambulation.  Medication Inspection Compliance: Pill count conducted under aseptic conditions, in front of the patient. Neither the pills nor the bottle was removed from the patient's sight at any time. Once count was completed pills were immediately returned to the patient in their original bottle.  Medication: Hydrocodone/APAP Pill/Patch Count: 46 of 120 pills remain Pill/Patch Appearance: Markings consistent with prescribed medication Bottle Appearance: Standard pharmacy container. Clearly labeled. Filled Date: 6 / 21 / 2019 Last Medication intake:  Today

## 2018-05-02 NOTE — Progress Notes (Signed)
Patient's Name: Suzanne Avila  MRN: [id number]  Referring Provider: Florian Buff*  DOB: 10-Oct-1946  PCP: Mar Daring, PA-C  DOS: 05/02/2018  Note by: Vevelyn Francois NP  Service setting: Ambulatory outpatient  Specialty: Interventional Pain Management  Location: [facility] (AMB) Pain Management Facility    Patient type: Established    Primary Reason(s) for Visit: Encounter for prescription drug management. (Level of risk: moderate)  CC: Back Pain (lower)  HPI  Ms. Laszlo is a 72 y.o. year old, adult patient, who comes today for a medication management evaluation. 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[place]); B12 deficiency; CAFL (chronic airflow limitation) ([place]); Narrowing of intervertebral disc space; Encounter for screening for diabetes mellitus; Acid reflux; Glaucoma; History of metabolic disorder; Benign essential HTN; Fungal infection of toenail; Osteopenia; Hypercholesterolemia without hypertriglyceridemia; Compulsive tobacco user syndrome; Absence of bladder continence; Osteoarthritis of hip (Left); Chronic hip pain (Left); Chronic lumbar radicular pain (L5 dermatome) (Location of Primary Source of Pain) (Bilateral) (L>R); Lumbar facet syndrome (Location of Primary Source of Pain) (Bilateral) (L>R); Lumbar spondylosis; 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 Her primarily concern today is the Back Pain (lower)  Pain Assessment: Location: Lower Back Radiating: to left buttock Onset: More than a month ago Duration: Chronic pain Quality: Aching, Burning, Sharp, Shooting Severity: 3 /10 (subjective, self-reported pain score)  Note: Reported level is compatible with observation.                          Effect on ADL:   Timing: Constant Modifying factors: medications BP: 128/76  HR: 75  Ms. Lawniczak was last scheduled for an appointment on 01/31/2018 for medication management. During today's appointment we reviewed Ms. Dimon's chronic pain status, as well as her outpatient medication regimen. She denies any changes in her pain. She denies any new concerns today. She denies any side effects of her medication.   The patient  reports that she does not use drugs. Her body mass index is 21.61 kg/m.  Further details on both, my assessment(s), as well as the proposed treatment plan, please see below.  Controlled Substance Pharmacotherapy Assessment REMS (Risk Evaluation and Mitigation Strategy)  Analgesic:Hydrocodone/APAP 7.5/325 one every 6 hours (30 mg per day) MME/day:30 mg/day.    Rise Patience, RN  05/02/2018 11:40 AM  Sign at close encounter Nursing Pain Medication Assessment:  Safety precautions to be maintained throughout the outpatient stay will include: orient to surroundings, keep bed in low position, maintain call bell within reach at all times, provide assistance with transfer out of bed and ambulation.  Medication Inspection Compliance: Pill count conducted under aseptic conditions, in front of the patient. Neither the pills nor the bottle was removed from  the patient's sight at any time.  Once count was completed pills were immediately returned to the patient in their original bottle.  Medication: Hydrocodone/APAP Pill/Patch Count: 46 of 120 pills remain Pill/Patch Appearance: Markings consistent with prescribed medication Bottle Appearance: Standard pharmacy container. Clearly labeled. Filled Date: 6 / 21 / 2019 Last Medication intake:  Today   Pharmacokinetics: Liberation and absorption (onset of action): WNL Distribution (time to peak effect): WNL Metabolism and excretion (duration of action): WNL         Pharmacodynamics: Desired effects: Analgesia: Ms. Lizama reports >50% benefit. Functional ability: Patient reports that medication allows her to accomplish basic ADLs Clinically meaningful improvement in function (CMIF): Sustained CMIF goals met Perceived effectiveness: Described as relatively effective, allowing for increase in activities of daily living (ADL) Undesirable effects: Side-effects or Adverse reactions: None reported Monitoring: [place] PMP: Online review of the past 20-monthperiod conducted. Compliant with practice rules and regulations Last UDS on record: Summary  Date Value Ref Range Status  11/01/2017 FINAL  Final    Comment:    ==================================================================== TOXASSURE SELECT 13 (MW) ==================================================================== Test                             Result       Flag       Units Drug Present and Declared for Prescription Verification   Hydrocodone                    3117         EXPECTED   ng/mg creat   Hydromorphone                  1063         EXPECTED   ng/mg creat   Norhydrocodone                 1775         EXPECTED   ng/mg creat    Sources of hydrocodone include scheduled prescription    medications. Hydromorphone and norhydrocodone are expected    metabolites of hydrocodone. Hydromorphone is also available as a    scheduled  prescription medication. ==================================================================== Test                      Result    Flag   Units      Ref Range   Creatinine              24               mg/dL      >=20 ==================================================================== Declared Medications:  The flagging and interpretation on this report are based on the  following declared medications.  Unexpected results may arise from  inaccuracies in the declared medications.  **Note: The testing scope of this panel includes these medications:  Hydrocodone (Norco)  **Note: The testing scope of this panel does not include following  reported medications:  Acetaminophen (Norco)  Albuterol  Aspirin  Atorvastatin  Cholecalciferol  Cyanocobalamin  Diltiazem  Dorzolamide  Fluticasone  Latanoprost (Xalatan)  Meloxicam (Mobic)  Potassium (Klor-Con)  Pregabalin (Lyrica)  Salmeterol ==================================================================== For clinical consultation, please call ([phone] ====================================================================    UDS interpretation: Compliant          Medication Assessment Form: Reviewed. Patient indicates being compliant with therapy Treatment compliance: Compliant Risk Assessment Profile: Aberrant behavior: See prior evaluations. None observed or detected today Comorbid factors increasing risk of overdose: See prior  notes. No additional risks detected today Risk of substance use disorder (SUD): Low Opioid Risk Tool - 05/02/18 1137      Family History of Substance Abuse   Alcohol  Negative    Illegal Drugs  Negative    Rx Drugs  Negative      Personal History of Substance Abuse   Alcohol  Negative    Illegal Drugs  Negative    Rx Drugs  Negative      Age   Age between 69-45 years   No      Psychological Disease   Psychological Disease  Negative    Depression  Negative      Total Score   Opioid Risk  Tool Scoring  0    Opioid Risk Interpretation  Low Risk      ORT Scoring interpretation table:  Score <3 = Low Risk for SUD  Score between 4-7 = Moderate Risk for SUD  Score >8 = High Risk for Opioid Abuse   Risk Mitigation Strategies:  Patient Counseling: Covered Patient-Prescriber Agreement (PPA): Present and active  Notification to other healthcare providers: Done  Pharmacologic Plan: No change in therapy, at this time.             Laboratory Chemistry  Inflammation Markers (CRP: Acute Phase) (ESR: Chronic Phase) Lab Results  Component Value Date   CRP <0.8 02/14/2017   ESRSEDRATE 20 02/14/2017                         Rheumatology Markers Lab Results  Component Value Date   RF <10.0 06/14/2016   ANA Negative 06/14/2016                        Renal Function Markers Lab Results  Component Value Date   BUN 9 01/01/2018   CREATININE 0.75 01/01/2018   BCR 12 01/01/2018   GFRAA 92 01/01/2018   GFRNONAA 80 01/01/2018                             Hepatic Function Markers Lab Results  Component Value Date   AST 13 01/01/2018   ALT 8 01/01/2018   ALBUMIN 4.2 01/01/2018   ALKPHOS 100 01/01/2018                        Electrolytes Lab Results  Component Value Date   NA 143 01/01/2018   K 4.3 01/01/2018   CL 102 01/01/2018   CALCIUM 9.4 01/01/2018   MG 1.7 02/14/2017                        Neuropathy Markers Lab Results  Component Value Date   VITAMINB12 1,427 (H) 01/01/2018                        Bone Pathology Markers Lab Results  Component Value Date   VD25OH 31.8 01/01/2018                         Coagulation Parameters Lab Results  Component Value Date   PLT 212 01/01/2018                        Cardiovascular Markers Lab Results  Component Value Date   BNP 78.2 06/14/2016  CKTOTAL 120 06/14/2016   HGB 14.2 01/01/2018   HCT 42.6 01/01/2018                         CA Markers No results found for: CEA, CA125, LABCA2                       Note: Lab results reviewed.  Recent Diagnostic Imaging Results   Complexity Note: Imaging results reviewed. Results shared with Ms. Rogue Bussing, using Layman's terms.                         Meds   Current Outpatient Medications:  .  albuterol (PROVENTIL HFA;VENTOLIN HFA) 108 (90 Base) MCG/ACT inhaler, ALBUTEROL, 90MCG/ACT (Inhalation Aerosol Solution)  2 PUFFS Every Day, As Needed for 0 days  Quantity: 0.00;  Refills: 0   Ordered :09-Jan-2014  Celene Kras, MA, [place]ast[place] ;  Started 10-Aug-2009 Active Comments: Medication taken as needed. , Disp: , Rfl:  .  aspirin 325 MG tablet, Take 325 mg by mouth daily., Disp: , Rfl:  .  atorvastatin (LIPITOR) 40 MG tablet, Take 1 tablet (40 mg total) by mouth daily., Disp: 90 tablet, Rfl: 3 .  cholecalciferol (VITAMIN D) 1000 units tablet, Take 1 tablet (1,000 Units total) by mouth daily., Disp: 90 tablet, Rfl: 3 .  diltiazem (CARDIZEM) 120 MG tablet, Take 1 tablet (120 mg total) by mouth daily., Disp: 90 tablet, Rfl: 3 .  dorzolamide (TRUSOPT) 2 % ophthalmic solution, , Disp: , Rfl:  .  Fluticasone-Salmeterol (ADVAIR DISKUS) 250-50 MCG/DOSE AEPB, USE 1 INHALATION TWICE A DAY, Disp: 180 each, Rfl: 3 .  [START ON 06/12/2018] HYDROcodone-acetaminophen (NORCO) 7.5-325 MG tablet, Take 1 tablet by mouth every 6 (six) hours as needed for severe pain., Disp: 120 tablet, Rfl: 0 .  latanoprost (XALATAN) 0.005 % ophthalmic solution, 1 drop at bedtime., Disp: , Rfl:  .  omeprazole (PRILOSEC) 40 MG capsule, Take 1 capsule (40 mg total) by mouth daily., Disp: 90 capsule, Rfl: 0 .  ramipril (ALTACE) 10 MG capsule, Take 1 capsule (10 mg total) by mouth daily., Disp: 90 capsule, Rfl: 3 .  rOPINIRole (REQUIP XL) 2 MG 24 hr tablet, Take 1 tablet (2 mg total) by mouth at bedtime., Disp: 90 tablet, Rfl: 3 .  spironolactone (ALDACTONE) 25 MG tablet, Take 1 tablet (25 mg total) by mouth daily., Disp: 90 tablet, Rfl: 3 .  vitamin B-12 (CYANOCOBALAMIN) 500 MCG tablet, Take 500  mcg by mouth daily., Disp: , Rfl:  .  [START ON 07/12/2018] HYDROcodone-acetaminophen (NORCO) 7.5-325 MG tablet, Take 1 tablet by mouth every 6 (six) hours as needed for severe pain., Disp: 120 tablet, Rfl: 0 .  [START ON 05/13/2018] HYDROcodone-acetaminophen (NORCO) 7.5-325 MG tablet, Take 1 tablet by mouth every 6 (six) hours as needed for severe pain., Disp: 120 tablet, Rfl: 0 .  [START ON 05/12/2018] meloxicam (MOBIC) 15 MG tablet, Take 1 tablet (15 mg total) by mouth daily., Disp: 90 tablet, Rfl: 0 .  [START ON 05/12/2018] pregabalin (LYRICA) 150 MG capsule, Take 1 capsule (150 mg total) by mouth every 8 (eight) hours., Disp: 270 capsule, Rfl: 0  ROS  Constitutional: Denies any fever or chills Gastrointestinal: No reported hemesis, hematochezia, vomiting, or acute GI distress Musculoskeletal: Denies any acute onset joint swelling, redness, loss of ROM, or weakness Neurological: No reported episodes of acute onset apraxia, aphasia, dysarthria, agnosia, amnesia, paralysis, loss of coordination, or  loss of consciousness  Allergies  Ms. Pieper is allergic to codeine; cyclobenzaprine; and penicillins.  [place]  Drug: Ms. Hoban  reports that she does not use drugs. Alcohol:  reports that she drinks about 25.2 oz of alcohol per week. Tobacco:  reports that she has been smoking cigarettes.  She has a 50.00 pack-year smoking history. She has never used smokeless tobacco. Medical:  has a past medical history of Glaucoma, Hyperlipidemia, and Hypertension. Surgical: Ms. Godby  has a past surgical history that includes Back surgery; Eye surgery; and Carpal tunnel release (Left). Family: family history includes Cancer in her father and mother.  Constitutional Exam  General appearance: Well nourished, well developed, and well hydrated. In no apparent acute distress Vitals:   05/02/18 1127  BP: 128/76  Pulse: 75  Resp: 16  Temp: 97.6 F (36.4 C)  TempSrc: Oral  SpO2: 99%  Weight: 107 lb (48.5  kg)  Height: '4\' 11"'$  (1.499 m)   BMI Assessment: Estimated body mass index is 21.61 kg/m as calculated from the following:   Height as of this encounter: '4\' 11"'$  (1.499 m).   Weight as of this encounter: 107 lb (48.5 kg). Psych/Mental status: Alert, oriented x 3 (person, place, & time)       Eyes: PERLA Respiratory: No evidence of acute respiratory distress   Lumbar Spine Area Exam  Skin & Axial Inspection: No masses, redness, or swelling Alignment: Symmetrical Functional ROM: Unrestricted ROM       Stability: No instability detected Muscle Tone/Strength: Functionally intact. No obvious neuro-muscular anomalies detected. Sensory (Neurological): Unimpaired Palpation: No palpable anomalies       Provocative Tests: Lumbar Hyperextension/rotation test: deferred today       Lumbar quadrant test (Kemp's test): deferred today       Lumbar Lateral bending test: deferred today       Patrick's Maneuver: deferred today                   FABER test: deferred today       Thigh-thrust test: deferred today       S-I compression test: deferred today       S-I distraction test: deferred today        Gait & Posture Assessment  Ambulation: Unassisted Gait: Relatively normal for age and body habitus Posture: WNL   Lower Extremity Exam    Side: Right lower extremity  Side: Left lower extremity  Stability: No instability observed          Stability: No instability observed          Skin & Extremity Inspection: Skin color, temperature, and hair growth are WNL. No peripheral edema or cyanosis. No masses, redness, swelling, asymmetry, or associated skin lesions. No contractures.  Skin & Extremity Inspection: Skin color, temperature, and hair growth are WNL. No peripheral edema or cyanosis. No masses, redness, swelling, asymmetry, or associated skin lesions. No contractures.  Functional ROM: Unrestricted ROM                  Functional ROM: Unrestricted ROM                  Muscle Tone/Strength:  Functionally intact. No obvious neuro-muscular anomalies detected.  Muscle Tone/Strength: Functionally intact. No obvious neuro-muscular anomalies detected.  Sensory (Neurological): Unimpaired  Sensory (Neurological): Unimpaired  Palpation: No palpable anomalies  Palpation: No palpable anomalies   Assessment  Primary Diagnosis & Pertinent Problem List: The primary encounter diagnosis was Lumbar spondylosis.  Diagnoses of Arthropathy of left hip, Fibromyalgia, Chronic pain syndrome, and Long term prescription opiate use were also pertinent to this visit.  Status Diagnosis  Controlled Controlled Controlled 1. Lumbar spondylosis   2. Arthropathy of left hip   3. Fibromyalgia   4. Chronic pain syndrome   5. Long term prescription opiate use     Problems updated and reviewed during this visit: No problems updated. Plan of Care  Pharmacotherapy (Medications Ordered): Meds ordered this encounter  Medications  . meloxicam (MOBIC) 15 MG tablet    Sig: Take 1 tablet (15 mg total) by mouth daily.    Dispense:  90 tablet    Refill:  0    Patient may have prescription filled one day early if pharmacy is closed on scheduled refill date. 3 month refill.    Order Specific Question:   Supervising Provider    Answer:   Milinda Pointer [phone]  . HYDROcodone-acetaminophen (NORCO) 7.5-325 MG tablet    Sig: Take 1 tablet by mouth every 6 (six) hours as needed for severe pain.    Dispense:  120 tablet    Refill:  0    Do not place this medication, or any other prescription from our practice, on "Automatic Refill". Patient may have prescription filled one day early if pharmacy is closed on scheduled refill date. Do not fill until: 07/12/2018 To last until:08/11/2018    Order Specific Question:   Supervising Provider    Answer:   Milinda Pointer [phone]  . HYDROcodone-acetaminophen (NORCO) 7.5-325 MG tablet    Sig: Take 1 tablet by mouth every 6 (six) hours as needed for severe pain.     Dispense:  120 tablet    Refill:  0    Do not place this medication, or any other prescription from our practice, on "Automatic Refill". Patient may have prescription filled one day early if pharmacy is closed on scheduled refill date. Do not fill until:06/12/2018 To last until: 07/12/2018    Order Specific Question:   Supervising Provider    Answer:   Milinda Pointer [phone]  . HYDROcodone-acetaminophen (NORCO) 7.5-325 MG tablet    Sig: Take 1 tablet by mouth every 6 (six) hours as needed for severe pain.    Dispense:  120 tablet    Refill:  0    Do not place this medication, or any other prescription from our practice, on "Automatic Refill". Patient may have prescription filled one day early if pharmacy is closed on scheduled refill date. Do not fill until: 05/13/2018 To last until:06/12/2018    Order Specific Question:   Supervising Provider    Answer:   Milinda Pointer [phone]  . pregabalin (LYRICA) 150 MG capsule    Sig: Take 1 capsule (150 mg total) by mouth every 8 (eight) hours.    Dispense:  270 capsule    Refill:  0    Do not place this medication, or any other prescription from our practice, on "Automatic Refill". Patient may have prescription filled one day early if pharmacy is closed on scheduled refill date. 3 month supply.    Order Specific Question:   Supervising Provider    Answer:   Milinda Pointer [[id number]]   New Prescriptions   No medications on file   Medications administered today: Kirstine Jacquin. [place] had no medications administered during this visit. Lab-work, procedure(s), and/or referral(s): Orders Placed This Encounter  Procedures  . ToxASSURE Select 13 (MW), Urine   Imaging and/or referral(s): None  Interventional therapies: Planned,  scheduled, and/or pending:   Not at this time.  Provider-requested follow-up: Return in about 3 months (around 08/02/2018) for MedMgmt with Me Donella Stade Edison Pace).  Future Appointments  Date Time Provider [place]  06/29/2018  8:30 AM [facility] VASC 1 [facility] None  06/29/2018  9:45 AM Dew, Erskine Squibb, MD [facility] None  08/02/2018 11:00 AM Vevelyn Francois, NP [facility] None  10/03/2018 10:40 AM Mar Daring, PA-C [facility] None  01/03/2019  9:20 AM [facility]-NURSE HEALTH ADVISOR [facility] None   Primary Care Physician: Rubye Beach Location: [facility] Note by: Vevelyn Francois NP Date: 05/02/2018; Time: 10:13 AM  Pain Score Disclaimer: We use the NRS-11 scale. This is a self-reported, subjective measurement of pain severity with only modest accuracy. It is used primarily to identify changes within a particular patient. It must be understood that outpatient pain scales are significantly less accurate that those used for research, where they can be applied under ideal controlled circumstances with minimal exposure to variables. In reality, the score is likely to be a combination of pain intensity and pain affect, where pain affect describes the degree of emotional arousal or changes in action readiness caused by the sensory experience of pain. Factors such as social and work situation, setting, emotional state, anxiety levels, expectation, and prior pain experience may influence pain perception and show large inter-individual differences that may also be affected by time variables.  Patient instructions provided during this appointment: Patient Instructions  Rx for Meloxicam has been escribed to your pharmacy.  You have been give 3 Rx for Hydrocodone with Tylenol and Rx for Lyrica.____________________________________________________________________________________________  Medication Rules  Applies to: All patients receiving prescriptions (written or electronic).  Pharmacy of record: Pharmacy where electronic prescriptions will be sent. If written prescriptions are taken to a different pharmacy, please inform the nursing staff. The pharmacy listed in the electronic  medical record should be the one where you would like electronic prescriptions to be sent.  Prescription refills: Only during scheduled appointments. Applies to both, written and electronic prescriptions.  NOTE: The following applies primarily to controlled substances (Opioid* Pain Medications).   Patient's responsibilities: 1. Pain Pills: Bring all pain pills to every appointment (except for procedure appointments). 2. Pill Bottles: Bring pills in original pharmacy bottle. Always bring newest bottle. Bring bottle, even if empty. 3. Medication refills: You are responsible for knowing and keeping track of what medications you need refilled. The day before your appointment, write a list of all prescriptions that need to be refilled. Bring that list to your appointment and give it to the admitting nurse. Prescriptions will be written only during appointments. If you forget a medication, it will not be "Called in", "Faxed", or "electronically sent". You will need to get another appointment to get these prescribed. 4. Prescription Accuracy: You are responsible for carefully inspecting your prescriptions before leaving our office. Have the discharge nurse carefully go over each prescription with you, before taking them home. Make sure that your name is accurately spelled, that your address is correct. Check the name and dose of your medication to make sure it is accurate. Check the number of pills, and the written instructions to make sure they are clear and accurate. Make sure that you are given enough medication to last until your next medication refill appointment. 5. Taking Medication: Take medication as prescribed. Never take more pills than instructed. Never take medication more frequently than prescribed. Taking less pills or less frequently is permitted  and encouraged, when it comes to controlled substances (written prescriptions).  6. Inform other Doctors: Always inform, all of your healthcare  providers, of all the medications you take. 7. Pain Medication from other Providers: You are not allowed to accept any additional pain medication from any other Doctor or Healthcare provider. There are two exceptions to this rule. (see below) In the event that you require additional pain medication, you are responsible for notifying us, as stated below. 8. Medication Agreement: You are responsible for carefully reading and following our Medication Agreement. This must be signed before receiving any prescriptions from our practice. Safely store a copy of your signed Agreement. Violations to the Agreement will result in no further prescriptions. (Additional copies of our Medication Agreement are available upon request.) 9. Laws, Rules, & Regulations: All patients are expected to follow all Federal and [employer], Rules, [employer]. Ignorance of the Laws does not constitute a valid excuse. The use of any illegal substances is prohibited. 10. Adopted CDC guidelines & recommendations: Target dosing levels will be at or below 60 MME/day. Use of benzodiazepines** is not recommended.  Exceptions: There are only two exceptions to the rule of not receiving pain medications from other Healthcare Providers. 1. Exception #1 (Emergencies): In the event of an emergency (i.e.: accident requiring emergency care), you are allowed to receive additional pain medication. However, you are responsible for: As soon as you are able, call our office [phone], at any time of the day or night, and leave a message stating your name, the date and nature of the emergency, and the name and dose of the medication prescribed. In the event that your call is answered by a member of our staff, make sure to document and save the date, time, and the name of the person that took your information.  2. Exception #2 (Planned Surgery): In the event that you are scheduled by another doctor or dentist to have any type of surgery or  procedure, you are allowed (for a period no longer than 30 days), to receive additional pain medication, for the acute post-op pain. However, in this case, you are responsible for picking up a copy of our "Post-op Pain Management for Surgeons" handout, and giving it to your surgeon or dentist. This document is available at our office, and does not require an appointment to obtain it. Simply go to our office during business hours (Monday-Thursday from 8:00 AM to 4:00 PM) (Friday 8:00 AM to 12:00 Noon) or if you have a scheduled appointment with [place], prior to your surgery, and ask for it by name. In addition, you will need to provide [place] with your name, name of your surgeon, type of surgery, and date of procedure or surgery.  *Opioid medications include: morphine, codeine, oxycodone, oxymorphone, hydrocodone, hydromorphone, meperidine, tramadol, tapentadol, buprenorphine, fentanyl, methadone. **Benzodiazepine medications include: diazepam (Valium), alprazolam (Xanax), clonazepam (Klonopine), lorazepam (Ativan), clorazepate (Tranxene), chlordiazepoxide (Librium), estazolam (Prosom), oxazepam (Serax), temazepam (Restoril), triazolam (Halcion) (Last updated: 12/21/2017) ____________________________________________________________________________________________

## 2018-05-07 LAB — TOXASSURE SELECT 13 (MW), URINE

## 2018-06-26 ENCOUNTER — Telehealth: Payer: Self-pay | Admitting: *Deleted

## 2018-06-26 DIAGNOSIS — Z122 Encounter for screening for malignant neoplasm of respiratory organs: Secondary | ICD-10-CM

## 2018-06-26 NOTE — Telephone Encounter (Signed)
Patient has been notified that the annual lung cancer screening low dose CT scan is due currently or will be in the near future.  Confirmed that the patient is within the age range of 31-80, and asymptomatic, and currently exhibits no signs or symptoms of lung cancer.  Patient denies illness that would prevent curative treatment for lung cancer if found.  Verified smoking history, current smoker, 64.5pkyr history  The shared decision making visit was completed on 01-04-18.  6 month follow up for Lung Rad 3.  Patient is agreeable for the CT scan to be scheduled.  Will call patient back with date and time of appointment.

## 2018-06-27 ENCOUNTER — Telehealth: Payer: Self-pay | Admitting: *Deleted

## 2018-06-27 NOTE — Telephone Encounter (Signed)
Attempted to call patient with LDCT screening appt for Monday 07/09/2018 @ 10:35am here @ [facility], unable to reach voice mail left and appt mailed to patient.

## 2018-06-29 ENCOUNTER — Ambulatory Visit (INDEPENDENT_AMBULATORY_CARE_PROVIDER_SITE_OTHER): Payer: Medicare Other

## 2018-06-29 ENCOUNTER — Encounter (INDEPENDENT_AMBULATORY_CARE_PROVIDER_SITE_OTHER): Payer: Self-pay | Admitting: Vascular Surgery

## 2018-06-29 ENCOUNTER — Ambulatory Visit (INDEPENDENT_AMBULATORY_CARE_PROVIDER_SITE_OTHER): Payer: Medicare Other | Admitting: Vascular Surgery

## 2018-06-29 VITALS — BP 159/81 | HR 76 | Resp 12 | Ht <= 58 in | Wt 105.0 lb

## 2018-06-29 DIAGNOSIS — M79605 Pain in left leg: Secondary | ICD-10-CM

## 2018-06-29 DIAGNOSIS — I10 Essential (primary) hypertension: Secondary | ICD-10-CM | POA: Diagnosis not present

## 2018-06-29 DIAGNOSIS — M79604 Pain in right leg: Secondary | ICD-10-CM | POA: Diagnosis not present

## 2018-06-29 DIAGNOSIS — I701 Atherosclerosis of renal artery: Secondary | ICD-10-CM

## 2018-06-29 DIAGNOSIS — E7800 Pure hypercholesterolemia, unspecified: Secondary | ICD-10-CM

## 2018-06-29 NOTE — Progress Notes (Signed)
MRN : [id number]  Suzanne Avila is a 72 y.o. (December 06, 1945) adult who presents with chief complaint of  Chief Complaint  Patient presents with  . Follow-up    6 month Renal u/s  .  History of Present Illness: Patient returns today in follow up of renal artery stenosis and hypertension.  The patient continues to have some hypertension although she says it is not that bad.  As far she knows, her renal function is okay.  No new complaints today.  She was offered a renal angiogram for right renal artery stenosis with a small right kidney earlier this year but declined.  Her duplex today shows an atrophic right kidney of less than 7 cm with markedly elevated velocities consistent with a high-grade right renal artery stenosis.  Her left renal artery stenosis is in the 1 to 59% range without any hemodynamically significant stenosis identified by duplex.  Current Outpatient Medications  Medication Sig Dispense Refill  . albuterol (PROVENTIL HFA;VENTOLIN HFA) 108 (90 Base) MCG/ACT inhaler ALBUTEROL, 90MCG/ACT (Inhalation Aerosol Solution)  2 PUFFS Every Day, As Needed for 0 days  Quantity: 0.00;  Refills: 0   Ordered :09-Jan-2014  Denna Haggard, MA, [place] ;  Started 10-Aug-2009 Active Comments: Medication taken as needed.     Marland Kitchen aspirin 325 MG tablet Take 325 mg by mouth daily.    Marland Kitchen atorvastatin (LIPITOR) 40 MG tablet Take 1 tablet (40 mg total) by mouth daily. 90 tablet 3  . cholecalciferol (VITAMIN D) 1000 units tablet Take 1 tablet (1,000 Units total) by mouth daily. 90 tablet 3  . diltiazem (CARDIZEM) 120 MG tablet Take 1 tablet (120 mg total) by mouth daily. 90 tablet 3  . dorzolamide (TRUSOPT) 2 % ophthalmic solution     . Fluticasone-Salmeterol (ADVAIR DISKUS) 250-50 MCG/DOSE AEPB USE 1 INHALATION TWICE A DAY 180 each 3  . [START ON 07/12/2018] HYDROcodone-acetaminophen (NORCO) 7.5-325 MG tablet Take 1 tablet by mouth every 6 (six) hours as needed for severe pain. 120 tablet 0  .  HYDROcodone-acetaminophen (NORCO) 7.5-325 MG tablet Take 1 tablet by mouth every 6 (six) hours as needed for severe pain. 120 tablet 0  . latanoprost (XALATAN) 0.005 % ophthalmic solution 1 drop at bedtime.    . meloxicam (MOBIC) 15 MG tablet Take 1 tablet (15 mg total) by mouth daily. 90 tablet 0  . omeprazole (PRILOSEC) 40 MG capsule Take 1 capsule (40 mg total) by mouth daily. 90 capsule 0  . potassium chloride (KLOR-CON) 20 MEQ packet     . pregabalin (LYRICA) 150 MG capsule Take 1 capsule (150 mg total) by mouth every 8 (eight) hours. 270 capsule 0  . ramipril (ALTACE) 10 MG capsule Take 1 capsule (10 mg total) by mouth daily. 90 capsule 3  . rOPINIRole (REQUIP XL) 2 MG 24 hr tablet Take 1 tablet (2 mg total) by mouth at bedtime. 90 tablet 3  . spironolactone (ALDACTONE) 25 MG tablet Take 1 tablet (25 mg total) by mouth daily. 90 tablet 3  . vitamin B-12 (CYANOCOBALAMIN) 500 MCG tablet Take 500 mcg by mouth daily.    Marland Kitchen HYDROcodone-acetaminophen (NORCO) 7.5-325 MG tablet Take 1 tablet by mouth every 6 (six) hours as needed for severe pain. 120 tablet 0   No current facility-administered medications for this visit.     Past Medical History:  Diagnosis Date  . Glaucoma   . Hyperlipidemia   . Hypertension     Past Surgical History:  Procedure Laterality Date  .  BACK SURGERY    . CARPAL TUNNEL RELEASE Left   . EYE SURGERY      Social History Social History   Tobacco Use  . Smoking status: Current Every Day Smoker    Packs/day: 1.00    Years: 50.00    Pack years: 50.00    Types: Cigarettes  . Smokeless tobacco: Never Used  Substance Use Topics  . Alcohol use: Yes    Alcohol/week: 42.0 standard drinks    Types: 42 Cans of beer per week  . Drug use: No     Family History Family History  Problem Relation Age of Onset  . Cancer Mother   . Cancer Father     Allergies  Allergen Reactions  . Codeine Itching  . Cyclobenzaprine Itching  . Penicillins     itching     REVIEW OF SYSTEMS (Negative unless checked)  Constitutional: [] Weight loss  [] Fever  [] Chills Cardiac: [] Chest pain   [] Chest pressure   [] Palpitations   [] Shortness of breath when laying flat   [] Shortness of breath at rest   [] Shortness of breath with exertion. Vascular:  [x] Pain in legs with walking   [] Pain in legs at rest   [] Pain in legs when laying flat   [x] Claudication   [] Pain in feet when walking  [] Pain in feet at rest  [] Pain in feet when laying flat   [] History of DVT   [] Phlebitis   [] Swelling in legs   [] Varicose veins   [] Non-healing ulcers Pulmonary:   [] Uses home oxygen   [] Productive cough   [] Hemoptysis   [] Wheeze  [] COPD   [] Asthma Neurologic:  [] Dizziness  [] Blackouts   [] Seizures   [] History of stroke   [] History of TIA  [] Aphasia   [] Temporary blindness   [] Dysphagia   [] Weakness or numbness in arms   [] Weakness or numbness in legs Musculoskeletal:  [x] Arthritis   [] Joint swelling   [x] Joint pain   [] Low back pain Hematologic:  [] Easy bruising  [] Easy bleeding   [] Hypercoagulable state   [] Anemic  [] Hepatitis Gastrointestinal:  [] Blood in stool   [] Vomiting blood  [] Gastroesophageal reflux/heartburn   [] Abdominal pain Genitourinary:  [x] Chronic kidney disease   [] Difficult urination  [] Frequent urination  [] Burning with urination   [] Hematuria Skin:  [] Rashes   [] Ulcers   [] Wounds Psychological:  [] History of anxiety   []  History of major depression.   Physical Examination  BP (!) 159/81 (BP Location: Left Arm, Patient Position: Sitting)   Pulse 76   Resp 12   Ht 4\' 9"  (1.448 m)   Wt 105 lb (47.6 kg)   BMI 22.72 kg/m  Gen:  WD/WN, NAD.  Heavy tobacco odor Head: [place]/AT, No temporalis wasting. Ear/Nose/Throat: Hearing grossly intact, nares w/o erythema or drainage Eyes: Conjunctiva clear. Sclera non-icteric Neck: Supple.  Trachea midline Pulmonary:  Good air movement, no use of accessory muscles.     Musculoskeletal: M/S 5/5 throughout.  No deformity or  atrophy.  No edema. Neurologic: Sensation grossly intact in extremities.  Symmetrical.  Speech is fluent.  Psychiatric: Judgment intact, Mood & affect appropriate for pt's clinical situation. Dermatologic: No rashes or ulcers noted.  No cellulitis or open wounds.       Labs Recent Results (from the past 2160 hour(s))  ToxASSURE Select 13 (MW), Urine     Status: None   Collection Time: 05/02/18  2:24 PM  Result Value Ref Range   Summary FINAL     Comment: ==================================================================== TOXASSURE SELECT 13 (MW) ==================================================================== Test  Result       Flag       Units Drug Present and Declared for Prescription Verification   Hydrocodone                    4122         EXPECTED   ng/mg creat   Hydromorphone                  785          EXPECTED   ng/mg creat   Dihydrocodeine                 107          EXPECTED   ng/mg creat   Norhydrocodone                 2432         EXPECTED   ng/mg creat    Sources of hydrocodone include scheduled prescription    medications. Hydromorphone, dihydrocodeine and norhydrocodone are    expected metabolites of hydrocodone. Hydromorphone and    dihydrocodeine are also available as scheduled prescription    medications. ==================================================================== Test                      Result    Flag   Units      Ref Range   Creatinine              81                mg/dL      >=28 ==================================================================== Declared Medications:  The flagging and interpretation on this report are based on the  following declared medications.  Unexpected results may arise from  inaccuracies in the declared medications.  **Note: The testing scope of this panel includes these medications:  Hydrocodone (Hydrocodone-Acetaminophen)  **Note: The testing scope of this panel does not include  following  reported medications:  Acetaminophen (Hydrocodone-Acetaminophen)  Albuterol  Aspirin  Atorvastatin  Cyanocobalamin  Diltiazem  Dorzolamide  Fluticasone (Advair)  Latanoprost  Meloxicam  Omeprazole  Pregabalin  Ramipril  Ropinirole  Salmeterol (Advair)  Spironolactone  Vitamin D ==================================================================== For clinical consultation, please call [phone]. ====================================================================     Radiology No results found.  Assessment/Plan Chronic low back pain (Location of Secondary source of pain) (Bilateral) (L>R) This could be causing her lower extremity symptoms.  Already getting treatment for this.  No PAD was seen on noninvasive studies  Hypercholesterolemia without hypertriglyceridemia lipid control important in reducing the progression of atherosclerotic disease. Continue statin therapy   Pain in limb The patient had lower extremity arterial studies which showed no evidence of significant PAD  Benign essential HTN May have a renovascular component as well.  Blood pressure seems well controlled.  Continue current medical regimen  Renal artery stenosis (HCC) Her duplex today shows an atrophic right kidney of less than 7 cm with markedly elevated velocities consistent with a high-grade right renal artery stenosis.  Her left renal artery stenosis is in the 1 to 59% range without any hemodynamically significant stenosis identified by duplex. At this point, her right kidney is likely not contributing function.  As long as her blood pressure is controlled, there is likely not a lot of benefit to intervening for her right renal artery stenosis as the kidney has little function.  Her left renal artery does not have hemodynamically significant stenosis.  She will continue her current medical regimen  and I will recheck her renal artery duplex in 1 year    Festus Barren,  MD  06/29/2018 10:21 AM    This note was created with Dragon medical transcription system.  Any errors from dictation are purely unintentional

## 2018-06-29 NOTE — Assessment & Plan Note (Signed)
May have a renovascular component as well.  Blood pressure seems well controlled.  Continue current medical regimen

## 2018-06-29 NOTE — Patient Instructions (Signed)
Renal Artery Stenosis Renal artery stenosis (RAS) is narrowing of the artery that carries blood to your kidneys. It can affect one or both kidneys. Your kidneys filter waste and extra fluid from your blood. You get rid of the waste and fluid when you urinate. Your kidneys also make an important chemical messenger (hormone) called renin. Renin helps regulate your blood pressure. The first sign of RAS may be high blood pressure. Over time, other symptoms can develop. What are the causes? Plaque buildup in your arteries (atherosclerosis) is the main cause of RAS. The plaques that cause this are made up of:  Fat.  Cholesterol.  Calcium.  Other substances.  As these substances build up in your renal artery, this slows the blood supply to your kidneys. The lack of blood and oxygen causes the signs and symptoms of RAS. A much less common cause of RAS is a disease called fibromuscular dysplasia. This disease causes abnormal cell growth that narrows the renal artery. It is not related to atherosclerosis. It occurs mostly in women who are 25-50 years old. It may be passed down through families. What increases the risk? You may be at risk for renal artery stenosis if you:  Are a man who is at least 73 years old.  Are a woman who is at least 73 years old.  Have high blood pressure.  Have high cholesterol.  Are a smoker.  Abuse alcohol.  Have diabetes or prediabetes.  Are overweight.  Have a family history of early heart disease.  What are the signs or symptoms? RAS usually develops slowly. You may not have any signs or symptoms at first. The earliest signs may be:  Developing high blood pressure.  A sudden increase in existing high blood pressure.  No longer responding to medicine that used to control your blood pressure.  Later signs and symptoms are due to kidney damage. They may include:  Fatigue.  Shortness of breath.  Swollen legs and feet.  Dry  skin.  Headaches.  Muscle cramps.  Loss of appetite.  Nausea or vomiting.  How is this diagnosed? Your health care provider may suspect RAS based on changes in your blood pressure and your risk factors. A physical exam will be done. Your health care provider may use a stethoscope to listen for a whooshing sound (bruit) that can occur where the renal artery is blocking blood flow. Several tests may be done to confirm a diagnosis of RAS. These may include:  Blood and urine tests to check your kidney function.  Imaging tests of your kidneys, such as: ? A test that involves using sound waves to create an image of your kidneys and the blood flow to your kidneys (ultrasound). ? A test in which dye is injected into one of your blood vessels so images can be taken as the dye flows through your renal arteries (angiogram). These tests can be done using X-rays, a CT scan (computed tomography angiogram, CTA), or a type of MRI (magnetic resonance angiogram, MRA).  How is this treated? Making lifestyle changes to reduce your risk factors is the first treatment option for early RAS. If the blood flow to one of your kidneys is cut by more than half, you may need medicine to:  Lower your blood pressure. This is the main medical treatment for RAS. You may need more than one type of medicine for this. The two types that work best for RAS are: ? ACE inhibitors. ? Angiotensin receptor blockers.  Reduce fluid   in the body (diuretics).  Lower your cholesterol (statins).  If medicine is not enough to control RAS, you may need surgery. This may involve:  Threading a tube with an inflatable balloon into the renal artery to force it open (angioplasty).  Removing plaque from inside the artery (endarterectomy).  Follow these instructions at home:  Take medicines only as directed by your health care provider.  Make any lifestyle changes recommended by your health care provider. This may include: ? Working  with a dietitian to maintain a heart-healthy diet. This type of diet is low in saturated fat, salt, and added sugar. ? Starting an exercise program as directed by your health care provider. ? Maintaining a healthy weight. ? Quitting smoking. ? Not abusing alcohol.  Keep all follow-up visits as directed by your health care provider. This is important. Contact a health care provider if:  Your symptoms of RAS are not getting better.  Your symptoms are changing or getting worse. Get help right away if:  You have very bad pain in your back or abdomen.  You have blood in your urine. This information is not intended to replace advice given to you by your health care provider. Make sure you discuss any questions you have with your health care provider. Document Released: 07/06/2005 Document Revised: 03/17/2016 Document Reviewed: 01/23/2014 [employer] Interactive Patient Education  2018 [employer]  

## 2018-06-29 NOTE — Assessment & Plan Note (Signed)
Her duplex today shows an atrophic right kidney of less than 7 cm with markedly elevated velocities consistent with a high-grade right renal artery stenosis.  Her left renal artery stenosis is in the 1 to 59% range without any hemodynamically significant stenosis identified by duplex. At this point, her right kidney is likely not contributing function.  As long as her blood pressure is controlled, there is likely not a lot of benefit to intervening for her right renal artery stenosis as the kidney has little function.  Her left renal artery does not have hemodynamically significant stenosis.  She will continue her current medical regimen and I will recheck her renal artery duplex in 1 year

## 2018-07-09 ENCOUNTER — Ambulatory Visit
Admission: RE | Admit: 2018-07-09 | Discharge: 2018-07-09 | Disposition: A | Discharge status: Home / Self Care | Payer: Medicare Other | Source: Ambulatory Visit | Attending: Oncology | Admitting: Oncology

## 2018-07-09 DIAGNOSIS — I700 Atherosclerosis of aorta: Secondary | ICD-10-CM | POA: Diagnosis not present

## 2018-07-09 DIAGNOSIS — Z87891 Personal history of nicotine dependence: Secondary | ICD-10-CM | POA: Diagnosis not present

## 2018-07-09 DIAGNOSIS — Z122 Encounter for screening for malignant neoplasm of respiratory organs: Secondary | ICD-10-CM

## 2018-07-11 ENCOUNTER — Encounter: Payer: Self-pay | Admitting: *Deleted

## 2018-08-02 ENCOUNTER — Other Ambulatory Visit: Payer: Self-pay

## 2018-08-02 ENCOUNTER — Encounter: Payer: Self-pay | Admitting: Nurse Practitioner

## 2018-08-02 ENCOUNTER — Ambulatory Visit: Payer: Medicare Other | Attending: Nurse Practitioner | Admitting: Nurse Practitioner

## 2018-08-02 VITALS — BP 152/76 | HR 133 | Temp 98.2°F | Resp 18 | Ht 59.0 in | Wt 108.6 lb

## 2018-08-02 DIAGNOSIS — M47816 Spondylosis without myelopathy or radiculopathy, lumbar region: Secondary | ICD-10-CM | POA: Diagnosis not present

## 2018-08-02 DIAGNOSIS — I10 Essential (primary) hypertension: Secondary | ICD-10-CM | POA: Insufficient documentation

## 2018-08-02 DIAGNOSIS — M129 Arthropathy, unspecified: Secondary | ICD-10-CM | POA: Diagnosis not present

## 2018-08-02 DIAGNOSIS — Z79891 Long term (current) use of opiate analgesic: Secondary | ICD-10-CM | POA: Insufficient documentation

## 2018-08-02 DIAGNOSIS — F17210 Nicotine dependence, cigarettes, uncomplicated: Secondary | ICD-10-CM | POA: Diagnosis not present

## 2018-08-02 DIAGNOSIS — M797 Fibromyalgia: Secondary | ICD-10-CM | POA: Diagnosis not present

## 2018-08-02 DIAGNOSIS — G894 Chronic pain syndrome: Secondary | ICD-10-CM | POA: Diagnosis not present

## 2018-08-02 DIAGNOSIS — I471 Supraventricular tachycardia: Secondary | ICD-10-CM | POA: Insufficient documentation

## 2018-08-02 DIAGNOSIS — Z79899 Other long term (current) drug therapy: Secondary | ICD-10-CM | POA: Diagnosis not present

## 2018-08-02 DIAGNOSIS — Z880 Allergy status to penicillin: Secondary | ICD-10-CM | POA: Diagnosis not present

## 2018-08-02 DIAGNOSIS — Z5181 Encounter for therapeutic drug level monitoring: Secondary | ICD-10-CM | POA: Insufficient documentation

## 2018-08-02 DIAGNOSIS — I701 Atherosclerosis of renal artery: Secondary | ICD-10-CM | POA: Insufficient documentation

## 2018-08-02 DIAGNOSIS — E7800 Pure hypercholesterolemia, unspecified: Secondary | ICD-10-CM | POA: Diagnosis not present

## 2018-08-02 DIAGNOSIS — Z885 Allergy status to narcotic agent status: Secondary | ICD-10-CM | POA: Insufficient documentation

## 2018-08-02 DIAGNOSIS — M4726 Other spondylosis with radiculopathy, lumbar region: Secondary | ICD-10-CM | POA: Insufficient documentation

## 2018-08-02 DIAGNOSIS — Z7982 Long term (current) use of aspirin: Secondary | ICD-10-CM | POA: Diagnosis not present

## 2018-08-02 DIAGNOSIS — M961 Postlaminectomy syndrome, not elsewhere classified: Secondary | ICD-10-CM | POA: Diagnosis not present

## 2018-08-02 DIAGNOSIS — M1612 Unilateral primary osteoarthritis, left hip: Secondary | ICD-10-CM

## 2018-08-02 DIAGNOSIS — K219 Gastro-esophageal reflux disease without esophagitis: Secondary | ICD-10-CM | POA: Insufficient documentation

## 2018-08-02 DIAGNOSIS — M48061 Spinal stenosis, lumbar region without neurogenic claudication: Secondary | ICD-10-CM | POA: Insufficient documentation

## 2018-08-02 DIAGNOSIS — F329 Major depressive disorder, single episode, unspecified: Secondary | ICD-10-CM | POA: Diagnosis not present

## 2018-08-02 DIAGNOSIS — M8580 Other specified disorders of bone density and structure, unspecified site: Secondary | ICD-10-CM | POA: Insufficient documentation

## 2018-08-02 MED ORDER — HYDROCODONE-ACETAMINOPHEN 7.5-325 MG PO TABS: 1.0000 | ORAL_TABLET | Freq: Four times a day (QID) | ORAL | 0 refills | Status: DC | PRN

## 2018-08-02 MED ORDER — PREGABALIN 150 MG PO CAPS: 150.0000 mg | ORAL_CAPSULE | Freq: Three times a day (TID) | ORAL | 0 refills | Status: DC

## 2018-08-02 NOTE — Progress Notes (Signed)
Patient's Name: Suzanne Avila  MRN: [id number]  Referring Provider: Florian Buff*  DOB: 06/19/1946  PCP: Mar Daring, PA-C  DOS: 08/02/2018  Note by: Vevelyn Francois NP  Service setting: Ambulatory outpatient  Specialty: Interventional Pain Management  Location: [facility] (AMB) Pain Management Facility    Patient type: Established    Primary Reason(s) for Visit: Encounter for prescription drug management. (Level of risk: moderate)  CC: Back Pain (lower)  HPI  Suzanne Avila is a 72 y.o. year old, adult patient, who comes today for a medication management evaluation. 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[place]); B12 deficiency; CAFL (chronic airflow limitation) ([place]); Narrowing of intervertebral disc space; Encounter for screening for diabetes mellitus; Acid reflux; Glaucoma; History of metabolic disorder; Benign essential HTN; Fungal infection of toenail; Osteopenia; Hypercholesterolemia without hypertriglyceridemia; Compulsive tobacco user syndrome; Absence of bladder continence; Osteoarthritis of hip (Left); Chronic hip pain (Left); Chronic lumbar radicular pain (L5 dermatome) (Location of Primary Source of Pain) (Bilateral) (L>R); Lumbar facet syndrome (Location of Primary Source of Pain) (Bilateral) (L>R); Lumbar spondylosis; 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 Her primarily concern today is the Back Pain (lower)  Pain Assessment: Location: Lower Back Radiating: sometimes to left buttock Onset: More than a month ago Duration: Chronic pain Quality: Constant Severity: 5 /10 (subjective, self-reported pain score)  Note: Reported level is compatible with observation.                          Timing: Constant Modifying factors: meds BP: (!) 152/76  HR: (!) 133  [name] was last scheduled for an appointment on 05/02/2018 for medication management. During today's appointment we reviewed Suzanne Avila's chronic pain status, as well as her outpatient medication regimen. She states that her pain is stable. She has noticed with too much activty she has a sharp pain that comes and goes.  She denies any other concerns.  She denies any side effects of her medication.  The patient  reports that she does not use drugs. Her body mass index is 21.93 kg/m.  Further details on both, my assessment(s), as well as the proposed treatment plan, please see below.  Controlled Substance Pharmacotherapy Assessment REMS (Risk Evaluation and Mitigation Strategy)  Analgesic:Hydrocodone/APAP 7.5/325 one every 6 hours (30 mg per day) MME/day:30 mg/day Rise Patience, RN  08/02/2018 11:14 AM  Sign at close encounter Nursing Pain Medication Assessment:  Safety precautions to be maintained throughout the outpatient stay will include: orient to surroundings, keep bed in low position, maintain call bell within reach at all times, provide assistance with transfer out of bed and ambulation.  Medication Inspection Compliance: [name] did not comply with our request to bring her pills to be counted.  She was reminded  that bringing the medication bottles, even when empty, is a requirement.  Medication: None brought in. Pill/Patch Count: None available to be counted. Bottle Appearance: No container available. Did not bring bottle(s) to appointment. Filled Date: N/A Last Medication intake:  Today   Pharmacokinetics: Liberation and absorption (onset of action): WNL Distribution (time to peak effect): WNL Metabolism and excretion (duration of action): WNL         Pharmacodynamics: Desired effects: Analgesia: Suzanne Avila reports >50% benefit. Functional ability: Patient reports that medication allows her to accomplish basic ADLs Clinically meaningful improvement in function (CMIF): Sustained CMIF goals met Perceived effectiveness: Described as relatively effective, allowing for increase in activities of daily living (ADL) Undesirable effects: Side-effects or Adverse reactions: None reported Monitoring: [place] PMP: Online review of the past 59-monthperiod conducted. Compliant with practice rules and regulations Last UDS on record: Summary  Date Value Ref Range Status  05/02/2018 FINAL  Final    Comment:    ==================================================================== TOXASSURE SELECT 13 (MW) ==================================================================== Test                             Result       Flag       Units Drug Present and Declared for Prescription Verification   Hydrocodone                    4122         EXPECTED   ng/mg creat   Hydromorphone                  785          EXPECTED   ng/mg creat   Dihydrocodeine                 107          EXPECTED   ng/mg creat   Norhydrocodone                 2432         EXPECTED   ng/mg creat    Sources of hydrocodone include scheduled prescription    medications. Hydromorphone, dihydrocodeine and norhydrocodone are    expected metabolites of hydrocodone. Hydromorphone and    dihydrocodeine are also available as scheduled  prescription    medications. ==================================================================== Test                      Result    Flag   Units      Ref Range   Creatinine              81               mg/dL      >=20 ==================================================================== Declared Medications:  The flagging and interpretation on this report are based on the  following declared medications.  Unexpected results may arise from  inaccuracies in the declared medications.  **Note: The testing scope of this panel includes these medications:  Hydrocodone (Hydrocodone-Acetaminophen)  **Note: The testing scope of this panel does not include following  reported medications:  Acetaminophen (Hydrocodone-Acetaminophen)  Albuterol  Aspirin  Atorvastatin  Cyanocobalamin  Diltiazem  Dorzolamide  Fluticasone ([place])  Latanoprost  Meloxicam  Omeprazole  Pregabalin  Ramipril  Ropinirole  Salmeterol (Advair)  Spironolactone  Vitamin D ==================================================================== For clinical consultation, please call ([phone] ====================================================================    UDS interpretation: Compliant  Medication Assessment Form: Reviewed. Patient indicates being compliant with therapy Treatment compliance: Deficiencies noted and steps taken to remind the patient of the seriousness of adequate therapy compliance Risk Assessment Profile: Aberrant behavior: failure to bring medications for pill counts patient was given her final warning Comorbid factors increasing risk of overdose: caucasian, COPD or asthma and history of alcoholism Opioid risk tool (ORT) (Total Score): 0 Personal History of Substance Abuse (SUD-Substance use disorder):  Alcohol: Negative  Illegal Drugs: Negative  Rx Drugs: Negative  ORT Risk Level calculation: Low Risk Risk of substance use disorder (SUD): Moderate Opioid Risk Tool - 08/02/18  1114      Family History of Substance Abuse   Alcohol  Negative    Illegal Drugs  Negative    Rx Drugs  Negative      Personal History of Substance Abuse   Alcohol  Negative    Illegal Drugs  Negative    Rx Drugs  Negative      Age   Age between 29-45 years   No      History of Preadolescent Sexual Abuse   History of Preadolescent Sexual Abuse  Negative or Female      Psychological Disease   Psychological Disease  Negative    Depression  Negative      Total Score   Opioid Risk Tool Scoring  0    Opioid Risk Interpretation  Low Risk      ORT Scoring interpretation table:  Score <3 = Low Risk for SUD  Score between 4-7 = Moderate Risk for SUD  Score >8 = High Risk for Opioid Abuse   Risk Mitigation Strategies:  Patient Counseling: Covered Patient-Prescriber Agreement (PPA): Present and active  Notification to other healthcare providers: Done  Pharmacologic Plan: No change in therapy, at this time.             Laboratory Chemistry  Inflammation Markers (CRP: Acute Phase) (ESR: Chronic Phase) Lab Results  Component Value Date   CRP <0.8 02/14/2017   ESRSEDRATE 20 02/14/2017                         Rheumatology Markers Lab Results  Component Value Date   RF <10.0 06/14/2016   ANA Negative 06/14/2016                        Renal Function Markers Lab Results  Component Value Date   BUN 9 01/01/2018   CREATININE 0.75 01/01/2018   BCR 12 01/01/2018   GFRAA 92 01/01/2018   GFRNONAA 80 01/01/2018                             Hepatic Function Markers Lab Results  Component Value Date   AST 13 01/01/2018   ALT 8 01/01/2018   ALBUMIN 4.2 01/01/2018   ALKPHOS 100 01/01/2018                        Electrolytes Lab Results  Component Value Date   NA 143 01/01/2018   K 4.3 01/01/2018   CL 102 01/01/2018   CALCIUM 9.4 01/01/2018   MG 1.7 02/14/2017                        Neuropathy Markers Lab Results  Component Value Date   VITAMINB12 1,427 (H)  01/01/2018  CNS Tests No results found for: COLORCSF, APPEARCSF, RBCCOUNTCSF, WBCCSF, POLYSCSF, LYMPHSCSF, EOSCSF, PROTEINCSF, GLUCCSF, JCVIRUS, CSFOLI, IGGCSF                      Bone Pathology Markers Lab Results  Component Value Date   VD25OH 31.8 01/01/2018                         Coagulation Parameters Lab Results  Component Value Date   PLT 212 01/01/2018                        Cardiovascular Markers Lab Results  Component Value Date   BNP 78.2 06/14/2016   CKTOTAL 120 06/14/2016   HGB 14.2 01/01/2018   HCT 42.6 01/01/2018                         CA Markers No results found for: CEA, CA125, LABCA2                      Note: Lab results reviewed.  Recent Diagnostic Imaging Results  CT CHEST LCS NODULE F/U W/O CONTRAST CLINICAL DATA:  72 year old female with 65 pack-year history of smoking. Lung cancer screening.  EXAM: CT CHEST WITHOUT CONTRAST FOR LUNG CANCER SCREENING NODULE FOLLOW-UP  TECHNIQUE: Multidetector CT imaging of the chest was performed following the standard protocol without IV contrast.  COMPARISON:  01/04/2018  FINDINGS: Cardiovascular: The heart size is normal. No substantial pericardial effusion. Coronary artery calcification is evident. Atherosclerotic calcification is noted in the wall of the thoracic aorta.  Mediastinum/Nodes: No mediastinal lymphadenopathy. No evidence for gross hilar lymphadenopathy although assessment is limited by the lack of intravenous contrast on today's study. The esophagus has normal imaging features. There is no axillary lymphadenopathy.  Lungs/Pleura: The central tracheobronchial airways are patent. Biapical pleuroparenchymal scarring evident. 7.1 mm part solid nodule right upper lobe is unchanged in the interval. No new or suspicious pulmonary nodule or mass. Subtle tree-in-bud nodularity peripheral right upper lobe is stable and likely reflects sequelae chronic atypical  infection. No pleural effusion.  Upper Abdomen: Right kidney atrophic.  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  IMPRESSION: 1. Lung-RADS 2, benign appearance or behavior. Continue annual screening with low-dose chest CT without contrast in 12 months. 2.  Aortic Atherosclerois (ICD10-170.0)  Electronically Signed   By: Misty Stanley M.D.   On: 07/09/2018 11:22  Complexity Note: Imaging results reviewed. Results shared with Ms. Rogue Bussing, using Layman's terms.                         Meds   Current Outpatient Medications:  .  albuterol (PROVENTIL HFA;VENTOLIN HFA) 108 (90 Base) MCG/ACT inhaler, ALBUTEROL, 90MCG/ACT (Inhalation Aerosol Solution)  2 PUFFS Every Day, As Needed for 0 days  Quantity: 0.00;  Refills: 0   Ordered :09-Jan-2014  Celene Kras, MA, [place]ast[place] ;  Started 10-Aug-2009 Active Comments: Medication taken as needed. , Disp: , Rfl:  .  aspirin 325 MG tablet, Take 325 mg by mouth daily., Disp: , Rfl:  .  atorvastatin (LIPITOR) 40 MG tablet, Take 1 tablet (40 mg total) by mouth daily., Disp: 90 tablet, Rfl: 3 .  cholecalciferol (VITAMIN D) 1000 units tablet, Take 1 tablet (1,000 Units total) by mouth daily., Disp: 90 tablet, Rfl: 3 .  diltiazem (CARDIZEM) 120 MG tablet, Take 1 tablet (  120 mg total) by mouth daily., Disp: 90 tablet, Rfl: 3 .  dorzolamide (TRUSOPT) 2 % ophthalmic solution, , Disp: , Rfl:  .  Fluticasone-Salmeterol (ADVAIR DISKUS) 250-50 MCG/DOSE AEPB, USE 1 INHALATION TWICE A DAY, Disp: 180 each, Rfl: 3 .  [START ON 10/10/2018] HYDROcodone-acetaminophen (NORCO) 7.5-325 MG tablet, Take 1 tablet by mouth every 6 (six) hours as needed for severe pain., Disp: 120 tablet, Rfl: 0 .  latanoprost (XALATAN) 0.005 % ophthalmic solution, 1 drop at bedtime., Disp: , Rfl:  .  meloxicam (MOBIC) 15 MG tablet, Take 1 tablet (15 mg total) by mouth daily., Disp: 90 tablet, Rfl: 0 .  omeprazole (PRILOSEC) 40 MG capsule, Take 1 capsule (40 mg  total) by mouth daily., Disp: 90 capsule, Rfl: 0 .  potassium chloride (KLOR-CON) 20 MEQ packet, , Disp: , Rfl:  .  [START ON 08/11/2018] pregabalin (LYRICA) 150 MG capsule, Take 1 capsule (150 mg total) by mouth every 8 (eight) hours., Disp: 270 capsule, Rfl: 0 .  ramipril (ALTACE) 10 MG capsule, Take 1 capsule (10 mg total) by mouth daily., Disp: 90 capsule, Rfl: 3 .  rOPINIRole (REQUIP XL) 2 MG 24 hr tablet, Take 1 tablet (2 mg total) by mouth at bedtime., Disp: 90 tablet, Rfl: 3 .  spironolactone (ALDACTONE) 25 MG tablet, Take 1 tablet (25 mg total) by mouth daily., Disp: 90 tablet, Rfl: 3 .  vitamin B-12 (CYANOCOBALAMIN) 500 MCG tablet, Take 500 mcg by mouth daily., Disp: , Rfl:  .  [START ON 09/10/2018] HYDROcodone-acetaminophen (NORCO) 7.5-325 MG tablet, Take 1 tablet by mouth every 6 (six) hours as needed for severe pain., Disp: 120 tablet, Rfl: 0 .  [START ON 08/11/2018] HYDROcodone-acetaminophen (NORCO) 7.5-325 MG tablet, Take 1 tablet by mouth every 6 (six) hours as needed for severe pain., Disp: 120 tablet, Rfl: 0  ROS  Constitutional: Denies any fever or chills Gastrointestinal: No reported hemesis, hematochezia, vomiting, or acute GI distress Musculoskeletal: Denies any acute onset joint swelling, redness, loss of ROM, or weakness Neurological: No reported episodes of acute onset apraxia, aphasia, dysarthria, agnosia, amnesia, paralysis, loss of coordination, or loss of consciousness  Allergies  Ms. Torre is allergic to codeine; cyclobenzaprine; and penicillins.  [place]  Drug: Ms. Snead  reports that she does not use drugs. Alcohol:  reports that she drinks about 42.0 standard drinks of alcohol per week. Tobacco:  reports that she has been smoking cigarettes. She has a 50.00 pack-year smoking history. She has never used smokeless tobacco. Medical:  has a past medical history of Glaucoma, Hyperlipidemia, and Hypertension. Surgical: Ms. Edell  has a past surgical history  that includes Back surgery; Eye surgery; and Carpal tunnel release (Left). Family: family history includes Cancer in her father and mother.  Constitutional Exam  General appearance: Well nourished, well developed, and well hydrated. In no apparent acute distress Vitals:   08/02/18 1104  BP: (!) 152/76  Pulse: (!) 133  Resp: 18  Temp: 98.2 F (36.8 C)  TempSrc: Oral  SpO2: 97%  Weight: 108 lb 9.6 oz (49.3 kg)  Height: '4\' 11"'$  (1.499 m)   BMI Assessment: Estimated body mass index is 21.93 kg/m as calculated from the following:   Height as of this encounter: '4\' 11"'$  (1.499 m).   Weight as of this encounter: 108 lb 9.6 oz (49.3 kg). Psych/Mental status: Alert, oriented x 3 (person, place, & time)       Eyes: PERLA Respiratory: No evidence of acute respiratory distress  Lumbar Spine Area  Exam  Skin & Axial Inspection: No masses, redness, or swelling Alignment: Symmetrical Functional ROM: Unrestricted ROM       Stability: No instability detected Muscle Tone/Strength: Functionally intact. No obvious neuro-muscular anomalies detected. Sensory (Neurological): Unimpaired Palpation: Complains of area being tender to palpation        Gait & Posture Assessment  Ambulation: Unassisted Gait: Relatively normal for age and body habitus Posture: WNL   Lower Extremity Exam    Side: Right lower extremity  Side: Left lower extremity  Stability: No instability observed          Stability: No instability observed          Skin & Extremity Inspection: Skin color, temperature, and hair growth are WNL. No peripheral edema or cyanosis. No masses, redness, swelling, asymmetry, or associated skin lesions. No contractures.  Skin & Extremity Inspection: Skin color, temperature, and hair growth are WNL. No peripheral edema or cyanosis. No masses, redness, swelling, asymmetry, or associated skin lesions. No contractures.  Functional ROM: Unrestricted ROM                  Functional ROM: Unrestricted ROM                   Muscle Tone/Strength: Functionally intact. No obvious neuro-muscular anomalies detected.  Muscle Tone/Strength: Functionally intact. No obvious neuro-muscular anomalies detected.  Sensory (Neurological): Unimpaired  Sensory (Neurological): Unimpaired  Palpation: No palpable anomalies  Palpation: No palpable anomalies   Assessment  Primary Diagnosis & Pertinent Problem List: The primary encounter diagnosis was Lumbar spondylosis. Diagnoses of Fibromyalgia, Arthropathy of left hip, Chronic pain syndrome, and Long term prescription opiate use were also pertinent to this visit.  Status Diagnosis  Controlled Controlled Controlled 1. Lumbar spondylosis   2. Fibromyalgia   3. Arthropathy of left hip   4. Chronic pain syndrome   5. Long term prescription opiate use     Problems updated and reviewed during this visit: No problems updated. Plan of Care  Pharmacotherapy (Medications Ordered): Meds ordered this encounter  Medications  . HYDROcodone-acetaminophen (NORCO) 7.5-325 MG tablet    Sig: Take 1 tablet by mouth every 6 (six) hours as needed for severe pain.    Dispense:  120 tablet    Refill:  0    Do not place this medication, or any other prescription from our practice, on "Automatic Refill". Patient may have prescription filled one day early if pharmacy is closed on scheduled refill date.    Order Specific Question:   Supervising Provider    Answer:   Milinda Pointer [phone]  . HYDROcodone-acetaminophen (NORCO) 7.5-325 MG tablet    Sig: Take 1 tablet by mouth every 6 (six) hours as needed for severe pain.    Dispense:  120 tablet    Refill:  0    Do not place this medication, or any other prescription from our practice, on "Automatic Refill". Patient may have prescription filled one day early if pharmacy is closed on scheduled refill date.    Order Specific Question:   Supervising Provider    Answer:   Milinda Pointer [phone]  . HYDROcodone-acetaminophen  (NORCO) 7.5-325 MG tablet    Sig: Take 1 tablet by mouth every 6 (six) hours as needed for severe pain.    Dispense:  120 tablet    Refill:  0    Do not place this medication, or any other prescription from our practice, on "Automatic Refill". Patient may have prescription filled one day  early if pharmacy is closed on scheduled refill date.    Order Specific Question:   Supervising Provider    Answer:   Milinda Pointer [phone]  . DISCONTD: pregabalin (LYRICA) 150 MG capsule    Sig: Take 1 capsule (150 mg total) by mouth every 8 (eight) hours.    Dispense:  270 capsule    Refill:  0    Do not place this medication, or any other prescription from our practice, on "Automatic Refill". Patient may have prescription filled one day early if pharmacy is closed on scheduled refill date. 3 month supply.    Order Specific Question:   Supervising Provider    Answer:   Milinda Pointer [phone]  . pregabalin (LYRICA) 150 MG capsule    Sig: Take 1 capsule (150 mg total) by mouth every 8 (eight) hours.    Dispense:  270 capsule    Refill:  0    Do not place this medication, or any other prescription from our practice, on "Automatic Refill". Patient may have prescription filled one day early if pharmacy is closed on scheduled refill date.    Order Specific Question:   Supervising Provider    Answer:   Milinda Pointer [[id number]]   New Prescriptions   No medications on file   Medications administered today: Jakeya Gherardi. Mcmanigal had no medications administered during this visit. Lab-work, procedure(s), and/or referral(s): Orders Placed This Encounter  Procedures  . ToxASSURE Select 13 (MW), Urine   Imaging and/or referral(s): None Interventional therapies: Planned, scheduled, and/or pending:   Not at this time.   Considering:   Diagnostic bilateral intra-articular hip joint injection  Possible bilateral hip joint radiofrequency ablation. Diagnostic bilateral lumbar facet block  Possible  bilateral lumbar facet radiofrequency ablation  Diagnostic bilateral L4-L5 transforaminal epidural steroid injection  Diagnostic caudal epidural steroid injection  Diagnostic caudal epidurogram  Possible right ask epidural lysis of adhesions.  Diagnostic left-sided L2-3 lumbar epidural steroid injection  Possible bilateral Lumbar spinal cord stimulator trial implant.    Palliative PRN treatment(s):   Diagnostic bilateral intra-articular hip joint injection  Diagnostic bilateral lumbar facet block  Diagnostic bilateral L4-L5 transforaminal epidural steroid injection  Diagnostic caudal epidural steroid injection  Diagnostic caudal epidurogram  Diagnostic left-sided L2-3 lumbar epidural steroid injection     Provider-requested follow-up: Return in about 3 months (around 11/02/2018) for MedMgmt.  Future Appointments  Date Time Provider [place]  10/03/2018 10:40 AM Mar Daring, PA-C [facility] None  10/31/2018  9:30 AM Vevelyn Francois, NP [facility] None  01/03/2019  9:20 AM [facility]-NURSE HEALTH ADVISOR [facility] None  07/02/2019  9:30 AM [facility] VASC 2 [facility] None  07/02/2019 10:30 AM Dew, Erskine Squibb, MD [facility] None   Primary Care Physician: Mar Daring, PA-C Location[facility] Note by: Vevelyn Francois NP Date: 08/02/2018; Time: 3:55 PM  Pain Score Disclaimer: We use the NRS-11 scale. This is a self-reported, subjective measurement of pain severity with only modest accuracy. It is used primarily to identify changes within a particular patient. It must be understood that outpatient pain scales are significantly less accurate that those used for research, where they can be applied under ideal controlled circumstances with minimal exposure to variables. In reality, the score is likely to be a combination of pain intensity and pain affect, where pain affect describes the degree of emotional arousal or changes in action readiness caused by the  sensory experience of pain. Factors such as social and work situation,  setting, emotional state, anxiety levels, expectation, and prior pain experience may influence pain perception and show large inter-individual differences that may also be affected by time variables.  Patient instructions provided during this appointment: Patient Instructions  Hydrocodone with acetaminophen to last until 11/09/2018 and lyrica have been escribed to your pharmacies.____________________________________________________________________________________________  Medication Rules  Applies to: All patients receiving prescriptions (written or electronic).  Pharmacy of record: Pharmacy where electronic prescriptions will be sent. If written prescriptions are taken to a different pharmacy, please inform the nursing staff. The pharmacy listed in the electronic medical record should be the one where you would like electronic prescriptions to be sent.  Prescription refills: Only during scheduled appointments. Applies to both, written and electronic prescriptions.  NOTE: The following applies primarily to controlled substances (Opioid* Pain Medications).   Patient's responsibilities: 1. Pain Pills: Bring all pain pills to every appointment (except for procedure appointments). 2. Pill Bottles: Bring pills in original pharmacy bottle. Always bring newest bottle. Bring bottle, even if empty. 3. Medication refills: You are responsible for knowing and keeping track of what medications you need refilled. The day before your appointment, write a list of all prescriptions that need to be refilled. Bring that list to your appointment and give it to the admitting nurse. Prescriptions will be written only during appointments. If you forget a medication, it will not be "Called in", "Faxed", or "electronically sent". You will need to get another appointment to get these prescribed. 4. Prescription Accuracy: You are responsible for  carefully inspecting your prescriptions before leaving our office. Have the discharge nurse carefully go over each prescription with you, before taking them home. Make sure that your name is accurately spelled, that your address is correct. Check the name and dose of your medication to make sure it is accurate. Check the number of pills, and the written instructions to make sure they are clear and accurate. Make sure that you are given enough medication to last until your next medication refill appointment. 5. Taking Medication: Take medication as prescribed. Never take more pills than instructed. Never take medication more frequently than prescribed. Taking less pills or less frequently is permitted and encouraged, when it comes to controlled substances (written prescriptions).  6. Inform other Doctors: Always inform, all of your healthcare providers, of all the medications you take. 7. Pain Medication from other Providers: You are not allowed to accept any additional pain medication from any other Doctor or Healthcare provider. There are two exceptions to this rule. (see below) In the event that you require additional pain medication, you are responsible for notifying us, as stated below. 8. Medication Agreement: You are responsible for carefully reading and following our Medication Agreement. This must be signed before receiving any prescriptions from our practice. Safely store a copy of your signed Agreement. Violations to the Agreement will result in no further prescriptions. (Additional copies of our Medication Agreement are available upon request.) 9. Laws, Rules, & Regulations: All patients are expected to follow all Federal and [employer], [employer], Rules, [employer] [facility] [employer]. Ignorance of the Laws does not constitute a valid excuse. The use of any illegal substances is prohibited. 10. Adopted CDC guidelines & recommendations: Target dosing levels will be at or below 60 MME/day. Use of benzodiazepines** is  not recommended.  Exceptions: There are only two exceptions to the rule of not receiving pain medications from other Healthcare Providers. 1. Exception #1 (Emergencies): In the event of an emergency (i.e.: accident requiring emergency care), you are allowed  to receive additional pain medication. However, you are responsible for: As soon as you are able, call our office [phone], at any time of the day or night, and leave a message stating your name, the date and nature of the emergency, and the name and dose of the medication prescribed. In the event that your call is answered by a member of our staff, make sure to document and save the date, time, and the name of the person that took your information.  2. Exception #2 (Planned Surgery): In the event that you are scheduled by another doctor or dentist to have any type of surgery or procedure, you are allowed (for a period no longer than 30 days), to receive additional pain medication, for the acute post-op pain. However, in this case, you are responsible for picking up a copy of our "Post-op Pain Management for Surgeons" handout, and giving it to your surgeon or dentist. This document is available at our office, and does not require an appointment to obtain it. Simply go to our office during business hours (Monday-Thursday from 8:00 AM to 4:00 PM) (Friday 8:00 AM to 12:00 Noon) or if you have a scheduled appointment with [place], prior to your surgery, and ask for it by name. In addition, you will need to provide [place] with your name, name of your surgeon, type of surgery, and date of procedure or surgery.  *Opioid medications include: morphine, codeine, oxycodone, oxymorphone, hydrocodone, hydromorphone, meperidine, tramadol, tapentadol, buprenorphine, fentanyl, methadone. **Benzodiazepine medications include: diazepam (Valium), alprazolam (Xanax), clonazepam (Klonopine), lorazepam (Ativan), clorazepate (Tranxene), chlordiazepoxide (Librium), estazolam  (Prosom), oxazepam (Serax), temazepam (Restoril), triazolam (Halcion) (Last updated: 12/21/2017) ____________________________________________________________________________________________

## 2018-08-02 NOTE — Progress Notes (Signed)
Nursing Pain Medication Assessment:  Safety precautions to be maintained throughout the outpatient stay will include: orient to surroundings, keep bed in low position, maintain call bell within reach at all times, provide assistance with transfer out of bed and ambulation.  Medication Inspection Compliance: Suzanne Avila did not comply with our request to bring her pills to be counted. She was reminded that bringing the medication bottles, even when empty, is a requirement.  Medication: None brought in. Pill/Patch Count: None available to be counted. Bottle Appearance: No container available. Did not bring bottle(s) to appointment. Filled Date: N/A Last Medication intake:  Today 

## 2018-08-02 NOTE — Patient Instructions (Addendum)
Hydrocodone with acetaminophen to last until 11/09/2018 and lyrica have been escribed to your pharmacies.____________________________________________________________________________________________  Medication Rules  Applies to: All patients receiving prescriptions (written or electronic).  Pharmacy of record: Pharmacy where electronic prescriptions will be sent. If written prescriptions are taken to a different pharmacy, please inform the nursing staff. The pharmacy listed in the electronic medical record should be the one where you would like electronic prescriptions to be sent.  Prescription refills: Only during scheduled appointments. Applies to both, written and electronic prescriptions.  NOTE: The following applies primarily to controlled substances (Opioid* Pain Medications).   Patient's responsibilities: 1. Pain Pills: Bring all pain pills to every appointment (except for procedure appointments). 2. Pill Bottles: Bring pills in original pharmacy bottle. Always bring newest bottle. Bring bottle, even if empty. 3. Medication refills: You are responsible for knowing and keeping track of what medications you need refilled. The day before your appointment, write a list of all prescriptions that need to be refilled. Bring that list to your appointment and give it to the admitting nurse. Prescriptions will be written only during appointments. If you forget a medication, it will not be "Called in", "Faxed", or "electronically sent". You will need to get another appointment to get these prescribed. 4. Prescription Accuracy: You are responsible for carefully inspecting your prescriptions before leaving our office. Have the discharge nurse carefully go over each prescription with you, before taking them home. Make sure that your name is accurately spelled, that your address is correct. Check the name and dose of your medication to make sure it is accurate. Check the number of pills, and the written  instructions to make sure they are clear and accurate. Make sure that you are given enough medication to last until your next medication refill appointment. 5. Taking Medication: Take medication as prescribed. Never take more pills than instructed. Never take medication more frequently than prescribed. Taking less pills or less frequently is permitted and encouraged, when it comes to controlled substances (written prescriptions).  6. Inform other Doctors: Always inform, all of your healthcare providers, of all the medications you take. 7. Pain Medication from other Providers: You are not allowed to accept any additional pain medication from any other Doctor or Healthcare provider. There are two exceptions to this rule. (see below) In the event that you require additional pain medication, you are responsible for notifying us, as stated below. 8. Medication Agreement: You are responsible for carefully reading and following our Medication Agreement. This must be signed before receiving any prescriptions from our practice. Safely store a copy of your signed Agreement. Violations to the Agreement will result in no further prescriptions. (Additional copies of our Medication Agreement are available upon request.) 9. Laws, Rules, & Regulations: All patients are expected to follow all [address] and [employer], Rules, [employer]. Ignorance of the Laws does not constitute a valid excuse. The use of any illegal substances is prohibited. 10. Adopted CDC guidelines & recommendations: Target dosing levels will be at or below 60 MME/day. Use of benzodiazepines** is not recommended.  Exceptions: There are only two exceptions to the rule of not receiving pain medications from other Healthcare Providers. 1. Exception #1 (Emergencies): In the event of an emergency (i.e.: accident requiring emergency care), you are allowed to receive additional pain medication. However, you are responsible for: As soon as you are  able, call our office [phone], at any time of the day or night, and leave a message stating your name, the  date and nature of the emergency, and the name and dose of the medication prescribed. In the event that your call is answered by a member of our staff, make sure to document and save the date, time, and the name of the person that took your information.  2. Exception #2 (Planned Surgery): In the event that you are scheduled by another doctor or dentist to have any type of surgery or procedure, you are allowed (for a period no longer than 30 days), to receive additional pain medication, for the acute post-op pain. However, in this case, you are responsible for picking up a copy of our "Post-op Pain Management for Surgeons" handout, and giving it to your surgeon or dentist. This document is available at our office, and does not require an appointment to obtain it. Simply go to our office during business hours (Monday-Thursday from 8:00 AM to 4:00 PM) (Friday 8:00 AM to 12:00 Noon) or if you have a scheduled appointment with [place], prior to your surgery, and ask for it by name. In addition, you will need to provide [place] with your name, name of your surgeon, type of surgery, and date of procedure or surgery.  *Opioid medications include: morphine, codeine, oxycodone, oxymorphone, hydrocodone, hydromorphone, meperidine, tramadol, tapentadol, buprenorphine, fentanyl, methadone. **Benzodiazepine medications include: diazepam (Valium), alprazolam (Xanax), clonazepam (Klonopine), lorazepam (Ativan), clorazepate (Tranxene), chlordiazepoxide (Librium), estazolam (Prosom), oxazepam (Serax), temazepam (Restoril), triazolam (Halcion) (Last updated: 12/21/2017) ____________________________________________________________________________________________

## 2018-08-07 LAB — TOXASSURE SELECT 13 (MW), URINE

## 2018-08-14 ENCOUNTER — Telehealth: Payer: Self-pay | Admitting: Physician Assistant

## 2018-08-22 ENCOUNTER — Telehealth: Payer: Self-pay | Admitting: Physician Assistant

## 2018-08-22 DIAGNOSIS — I10 Essential (primary) hypertension: Secondary | ICD-10-CM

## 2018-08-22 DIAGNOSIS — J449 Chronic obstructive pulmonary disease, unspecified: Secondary | ICD-10-CM

## 2018-08-22 DIAGNOSIS — I701 Atherosclerosis of renal artery: Secondary | ICD-10-CM

## 2018-08-22 DIAGNOSIS — G2581 Restless legs syndrome: Secondary | ICD-10-CM

## 2018-08-22 DIAGNOSIS — M1612 Unilateral primary osteoarthritis, left hip: Secondary | ICD-10-CM

## 2018-08-22 DIAGNOSIS — K219 Gastro-esophageal reflux disease without esophagitis: Secondary | ICD-10-CM

## 2018-08-22 DIAGNOSIS — E7800 Pure hypercholesterolemia, unspecified: Secondary | ICD-10-CM

## 2018-08-22 DIAGNOSIS — E559 Vitamin D deficiency, unspecified: Secondary | ICD-10-CM

## 2018-08-22 NOTE — Telephone Encounter (Signed)
Pt needing 90 day refills on:  atorvastatin (LIPITOR) 40 MG tablet cholecalciferol (VITAMIN D) 1000 units tablet diltiazem (CARDIZEM) 120 MG tablet  Fluticasone-Salmeterol (ADVAIR DISKUS) 250-50 MCG/DOSE AEPB  meloxicam (MOBIC) 15 MG tablet(Expired) omeprazole (PRILOSEC) 40 MG capsule ramipril (ALTACE) 10 MG capsule rOPINIRole (REQUIP XL) 2 MG 24 hr tablet spironolactone (ALDACTONE) 25 MG tablet vitamin B-12 (CYANOCOBALAMIN) 500 MCG tablet   Please fill at:  [facility] - [place], [place] - [address] [phone] (Phone) [phone] (Fax)   Pt also wanting what each medication is taken for.  Something typed for her to have on hand would be good.  Please advise.  Thanks, [employer]

## 2018-08-23 MED ORDER — ROPINIROLE HCL ER 2 MG PO TB24: 2.0000 mg | ORAL_TABLET | Freq: Every day | ORAL | 3 refills | Status: DC

## 2018-08-23 MED ORDER — ATORVASTATIN CALCIUM 40 MG PO TABS: 40.0000 mg | ORAL_TABLET | Freq: Every day | ORAL | 3 refills | Status: DC

## 2018-08-23 MED ORDER — SPIRONOLACTONE 25 MG PO TABS: 25.0000 mg | ORAL_TABLET | Freq: Every day | ORAL | 0 refills | Status: DC

## 2018-08-23 MED ORDER — VITAMIN B-12 500 MCG PO TABS: 500.0000 ug | ORAL_TABLET | Freq: Every day | ORAL | 0 refills | Status: DC

## 2018-08-23 MED ORDER — OMEPRAZOLE 40 MG PO CPDR: 40.0000 mg | DELAYED_RELEASE_CAPSULE | Freq: Every day | ORAL | 0 refills | Status: DC

## 2018-08-23 MED ORDER — RAMIPRIL 10 MG PO CAPS: 10.0000 mg | ORAL_CAPSULE | Freq: Every day | ORAL | 3 refills | Status: DC

## 2018-08-23 MED ORDER — FLUTICASONE-SALMETEROL 250-50 MCG/DOSE IN AEPB: INHALATION_SPRAY | RESPIRATORY_TRACT | 3 refills | Status: DC

## 2018-08-23 MED ORDER — VITAMIN D 1000 UNITS PO TABS: 1000.0000 [IU] | ORAL_TABLET | Freq: Every day | ORAL | 3 refills | Status: DC

## 2018-08-23 MED ORDER — DILTIAZEM HCL 120 MG PO TABS: 120.0000 mg | ORAL_TABLET | Freq: Every day | ORAL | 3 refills | Status: DC

## 2018-08-23 MED ORDER — MELOXICAM 15 MG PO TABS: 15.0000 mg | ORAL_TABLET | Freq: Every day | ORAL | 0 refills | Status: DC

## 2018-08-23 NOTE — Telephone Encounter (Signed)
Rx sent  Erasmo Downer, MD, MPH [facility] 08/23/2018 9:33 AM

## 2018-08-26 ENCOUNTER — Other Ambulatory Visit: Payer: Self-pay | Admitting: Nurse Practitioner

## 2018-08-26 DIAGNOSIS — M1612 Unilateral primary osteoarthritis, left hip: Secondary | ICD-10-CM

## 2018-10-03 ENCOUNTER — Ambulatory Visit (INDEPENDENT_AMBULATORY_CARE_PROVIDER_SITE_OTHER): Payer: Medicare Other | Admitting: Physician Assistant

## 2018-10-03 ENCOUNTER — Encounter: Payer: Self-pay | Admitting: Physician Assistant

## 2018-10-03 VITALS — BP 124/74 | HR 81 | Temp 98.0°F | Wt 104.4 lb

## 2018-10-03 DIAGNOSIS — I10 Essential (primary) hypertension: Secondary | ICD-10-CM | POA: Diagnosis not present

## 2018-10-03 DIAGNOSIS — F4321 Adjustment disorder with depressed mood: Secondary | ICD-10-CM | POA: Diagnosis not present

## 2018-10-03 DIAGNOSIS — I701 Atherosclerosis of renal artery: Secondary | ICD-10-CM | POA: Diagnosis not present

## 2018-10-03 DIAGNOSIS — Z13820 Encounter for screening for osteoporosis: Secondary | ICD-10-CM | POA: Diagnosis not present

## 2018-10-03 DIAGNOSIS — Z1239 Encounter for other screening for malignant neoplasm of breast: Secondary | ICD-10-CM | POA: Diagnosis not present

## 2018-10-03 DIAGNOSIS — Z780 Asymptomatic menopausal state: Secondary | ICD-10-CM | POA: Diagnosis not present

## 2018-10-03 MED ORDER — SERTRALINE HCL 50 MG PO TABS: 50.0000 mg | ORAL_TABLET | Freq: Every day | ORAL | 3 refills | Status: DC

## 2018-10-03 NOTE — Progress Notes (Signed)
Patient: Suzanne Avila Adult    DOB: Jun 09, 1946   72 y.o.   MRN: [id number] Visit Date: 10/03/2018  Today's Provider: Margaretann LovelessJennifer M Adaya Garmany, PA-C   Chief Complaint  Patient presents with  . Hypertension   Subjective:    HPI  Hypertension, follow-up:  BP Readings from Last 3 Encounters:  10/03/18 124/74  08/02/18 (!) 152/76  06/29/18 (!) 159/81    She was last seen for hypertension 6 months ago.  BP at that visit was 152/76. Management changes since that visit include none. She reports good compliance with treatment. She is not having side effects.  She is exercising. She is adherent to low salt diet.   Outside blood pressures are none to report. She is experiencing none.  Patient denies chest pain, chest pressure/discomfort, exertional chest pressure/discomfort, fatigue, irregular heart beat, palpitations and tachypnea.   Cardiovascular risk factors include advanced age (older than 5255 for men, 6165 for women).  Use of agents associated with hypertension: none.     Weight trend: stable Wt Readings from Last 3 Encounters:  10/03/18 104 lb 6.4 oz (47.4 kg)  08/02/18 108 lb 9.6 oz (49.3 kg)  06/29/18 105 lb (47.6 kg)    Current diet: in general, an "unhealthy" diet  ------------------------------------------------------------------------     Allergies  Allergen Reactions  . Codeine Itching  . Cyclobenzaprine Itching  . Penicillins     itching     Current Outpatient Medications:  .  albuterol (PROVENTIL HFA;VENTOLIN HFA) 108 (90 Base) MCG/ACT inhaler, ALBUTEROL, 90MCG/ACT (Inhalation Aerosol Solution)  2 PUFFS Every Day, As Needed for 0 days  Quantity: 0.00;  Refills: 0   Ordered :09-Jan-2014  Denna HaggardAleksandrova, MA, Anastasiya ;  Started 10-Aug-2009 Active Comments: Medication taken as needed. , Disp: , Rfl:  .  aspirin 325 MG tablet, Take 325 mg by mouth daily., Disp: , Rfl:  .  atorvastatin (LIPITOR) 40 MG tablet, Take 1 tablet (40 mg total) by mouth  daily., Disp: 90 tablet, Rfl: 3 .  cholecalciferol (VITAMIN D) 1000 units tablet, Take 1 tablet (1,000 Units total) by mouth daily., Disp: 90 tablet, Rfl: 3 .  diltiazem (CARDIZEM) 120 MG tablet, Take 1 tablet (120 mg total) by mouth daily., Disp: 90 tablet, Rfl: 3 .  dorzolamide (TRUSOPT) 2 % ophthalmic solution, , Disp: , Rfl:  .  Fluticasone-Salmeterol (ADVAIR DISKUS) 250-50 MCG/DOSE AEPB, USE 1 INHALATION TWICE A DAY, Disp: 180 each, Rfl: 3 .  [START ON 10/10/2018] HYDROcodone-acetaminophen (NORCO) 7.5-325 MG tablet, Take 1 tablet by mouth every 6 (six) hours as needed for severe pain., Disp: 120 tablet, Rfl: 0 .  HYDROcodone-acetaminophen (NORCO) 7.5-325 MG tablet, Take 1 tablet by mouth every 6 (six) hours as needed for severe pain., Disp: 120 tablet, Rfl: 0 .  latanoprost (XALATAN) 0.005 % ophthalmic solution, 1 drop at bedtime., Disp: , Rfl:  .  meloxicam (MOBIC) 15 MG tablet, Take 1 tablet (15 mg total) by mouth daily., Disp: 90 tablet, Rfl: 0 .  omeprazole (PRILOSEC) 40 MG capsule, Take 1 capsule (40 mg total) by mouth daily., Disp: 90 capsule, Rfl: 0 .  potassium chloride (KLOR-CON) 20 MEQ packet, , Disp: , Rfl:  .  pregabalin (LYRICA) 150 MG capsule, Take 1 capsule (150 mg total) by mouth every 8 (eight) hours., Disp: 270 capsule, Rfl: 0 .  ramipril (ALTACE) 10 MG capsule, Take 1 capsule (10 mg total) by mouth daily., Disp: 90 capsule, Rfl: 3 .  rOPINIRole (REQUIP XL) 2 MG  24 hr tablet, Take 1 tablet (2 mg total) by mouth at bedtime., Disp: 90 tablet, Rfl: 3 .  spironolactone (ALDACTONE) 25 MG tablet, Take 1 tablet (25 mg total) by mouth daily., Disp: 90 tablet, Rfl: 0 .  vitamin B-12 (CYANOCOBALAMIN) 500 MCG tablet, Take 1 tablet (500 mcg total) by mouth daily., Disp: 90 tablet, Rfl: 0 .  HYDROcodone-acetaminophen (NORCO) 7.5-325 MG tablet, Take 1 tablet by mouth every 6 (six) hours as needed for severe pain., Disp: 120 tablet, Rfl: 0  Review of Systems  Constitutional: Negative.     HENT: Negative.   Respiratory: Negative.   Cardiovascular: Negative.   Gastrointestinal: Negative.   Neurological: Negative.     Social History   Tobacco Use  . Smoking status: Current Every Day Smoker    Packs/day: 1.00    Years: 50.00    Pack years: 50.00    Types: Cigarettes  . Smokeless tobacco: Never Used  Substance Use Topics  . Alcohol use: Yes    Alcohol/week: 42.0 standard drinks    Types: 42 Cans of beer per week   Objective:   BP 124/74 (BP Location: Left Arm, Patient Position: Sitting, Cuff Size: Normal)   Pulse 81   Temp 98 F (36.7 C) (Oral)   Wt 104 lb 6.4 oz (47.4 kg)   SpO2 96%   BMI 21.09 kg/m  Vitals:   10/03/18 1043  BP: 124/74  Pulse: 81  Temp: 98 F (36.7 C)  TempSrc: Oral  SpO2: 96%  Weight: 104 lb 6.4 oz (47.4 kg)     Physical Exam  Constitutional: She appears well-developed and well-nourished. No distress.  HENT:  Head: Normocephalic and atraumatic.  Neck: Normal range of motion. Neck supple. No JVD present. No tracheal deviation present. No thyromegaly present.  Cardiovascular: Normal rate, regular rhythm and normal heart sounds. Exam reveals no gallop and no friction rub.  No murmur heard. Pulmonary/Chest: Effort normal and breath sounds normal. No respiratory distress. She has no wheezes. She has no rales.  Lymphadenopathy:    She has no cervical adenopathy.  Skin: She is not diaphoretic.  Psychiatric: Her speech is normal and behavior is normal. Judgment and thought content normal. Her mood appears anxious. Cognition and memory are normal. She exhibits a depressed mood.  Vitals reviewed.       Assessment & Plan:     1. Benign essential HTN Much improved. Continue ramipril, spironolactone and diltiazem.   2. Renal artery stenosis (HCC) Doing well. Saw Dr. Wyn Quaker in September and cleared for one year.   3. Breast cancer screening There is no family history of breast cancer. She does perform regular self breast exams.  Mammogram was ordered as below. Information for [facility] was given to patient so she may schedule her mammogram at her convenience. - MM 3D SCREEN BREAST BILATERAL; Future  4. Osteoporosis screening Not done in a while. Ordered bone density.  - DG Bone Density; Future  5. Postmenopausal estrogen deficiency See above medical treatment plan. - DG Bone Density; Future  6. Situational depression Worsening since the death of her granddaughter 2 months ago. Will start sertraline as below. I will see her back in march for her AWV.  - sertraline (ZOLOFT) 50 MG tablet; Take 1 tablet (50 mg total) by mouth daily.  Dispense: 30 tablet; Refill: 3       Margaretann Loveless, PA-C  [facility]

## 2018-10-03 NOTE — Patient Instructions (Signed)
DASH Eating Plan DASH stands for "Dietary Approaches to Stop Hypertension." The DASH eating plan is a healthy eating plan that has been shown to reduce high blood pressure (hypertension). It may also reduce your risk for type 2 diabetes, heart disease, and stroke. The DASH eating plan may also help with weight loss. What are tips for following this plan? General guidelines  Avoid eating more than 2,300 mg (milligrams) of salt (sodium) a day. If you have hypertension, you may need to reduce your sodium intake to 1,500 mg a day.  Limit alcohol intake to no more than 1 drink a day for nonpregnant women and 2 drinks a day for men. One drink equals 12 oz of beer, 5 oz of wine, or 1 oz of hard liquor.  Work with your health care provider to maintain a healthy body weight or to lose weight. Ask what an ideal weight is for you.  Get at least 30 minutes of exercise that causes your heart to beat faster (aerobic exercise) most days of the week. Activities may include walking, swimming, or biking.  Work with your health care provider or diet and nutrition specialist (dietitian) to adjust your eating plan to your individual calorie needs. Reading food labels  Check food labels for the amount of sodium per serving. Choose foods with less than 5 percent of the Daily Value of sodium. Generally, foods with less than 300 mg of sodium per serving fit into this eating plan.  To find whole grains, look for the word "whole" as the first word in the ingredient list. Shopping  Buy products labeled as "low-sodium" or "no salt added."  Buy fresh foods. Avoid canned foods and premade or frozen meals. Cooking  Avoid adding salt when cooking. Use salt-free seasonings or herbs instead of table salt or sea salt. Check with your health care provider or pharmacist before using salt substitutes.  Do not fry foods. Cook foods using healthy methods such as baking, boiling, grilling, and broiling instead.  Cook with  heart-healthy oils, such as olive, canola, soybean, or sunflower oil. Meal planning   Eat a balanced diet that includes: ? 5 or more servings of fruits and vegetables each day. At each meal, try to fill half of your plate with fruits and vegetables. ? Up to 6-8 servings of whole grains each day. ? Less than 6 oz of lean meat, poultry, or fish each day. A 3-oz serving of meat is about the same size as a deck of cards. One egg equals 1 oz. ? 2 servings of low-fat dairy each day. ? A serving of nuts, seeds, or beans 5 times each week. ? Heart-healthy fats. Healthy fats called Omega-3 fatty acids are found in foods such as flaxseeds and coldwater fish, like sardines, salmon, and mackerel.  Limit how much you eat of the following: ? Canned or prepackaged foods. ? Food that is high in trans fat, such as fried foods. ? Food that is high in saturated fat, such as fatty meat. ? Sweets, desserts, sugary drinks, and other foods with added sugar. ? Full-fat dairy products.  Do not salt foods before eating.  Try to eat at least 2 vegetarian meals each week.  Eat more home-cooked food and less restaurant, buffet, and fast food.  When eating at a restaurant, ask that your food be prepared with less salt or no salt, if possible. What foods are recommended? The items listed may not be a complete list. Talk with your dietitian about what   dietary choices are best for you. Grains Whole-grain or whole-wheat bread. Whole-grain or whole-wheat pasta. Brown rice. Oatmeal. Quinoa. Bulgur. Whole-grain and low-sodium cereals. Pita bread. Low-fat, low-sodium crackers. Whole-wheat flour tortillas. Vegetables Fresh or frozen vegetables (raw, steamed, roasted, or grilled). Low-sodium or reduced-sodium tomato and vegetable juice. Low-sodium or reduced-sodium tomato sauce and tomato paste. Low-sodium or reduced-sodium canned vegetables. Fruits All fresh, dried, or frozen fruit. Canned fruit in natural juice (without  added sugar). Meat and other protein foods Skinless chicken or turkey. Ground chicken or turkey. Pork with fat trimmed off. Fish and seafood. Egg whites. Dried beans, peas, or lentils. Unsalted nuts, nut butters, and seeds. Unsalted canned beans. Lean cuts of beef with fat trimmed off. Low-sodium, lean deli meat. Dairy Low-fat (1%) or fat-free (skim) milk. Fat-free, low-fat, or reduced-fat cheeses. Nonfat, low-sodium ricotta or cottage cheese. Low-fat or nonfat yogurt. Low-fat, low-sodium cheese. Fats and oils Soft margarine without trans fats. Vegetable oil. Low-fat, reduced-fat, or light mayonnaise and salad dressings (reduced-sodium). Canola, safflower, olive, soybean, and sunflower oils. Avocado. Seasoning and other foods Herbs. Spices. Seasoning mixes without salt. Unsalted popcorn and pretzels. Fat-free sweets. What foods are not recommended? The items listed may not be a complete list. Talk with your dietitian about what dietary choices are best for you. Grains Baked goods made with fat, such as croissants, muffins, or some breads. Dry pasta or rice meal packs. Vegetables Creamed or fried vegetables. Vegetables in a cheese sauce. Regular canned vegetables (not low-sodium or reduced-sodium). Regular canned tomato sauce and paste (not low-sodium or reduced-sodium). Regular tomato and vegetable juice (not low-sodium or reduced-sodium). Pickles. Olives. Fruits Canned fruit in a light or heavy syrup. Fried fruit. Fruit in cream or butter sauce. Meat and other protein foods Fatty cuts of meat. Ribs. Fried meat. Bacon. Sausage. Bologna and other processed lunch meats. Salami. Fatback. Hotdogs. Bratwurst. Salted nuts and seeds. Canned beans with added salt. Canned or smoked fish. Whole eggs or egg yolks. Chicken or turkey with skin. Dairy Whole or 2% milk, cream, and half-and-half. Whole or full-fat cream cheese. Whole-fat or sweetened yogurt. Full-fat cheese. Nondairy creamers. Whipped toppings.  Processed cheese and cheese spreads. Fats and oils Butter. Stick margarine. Lard. Shortening. Ghee. Bacon fat. Tropical oils, such as coconut, palm kernel, or palm oil. Seasoning and other foods Salted popcorn and pretzels. Onion salt, garlic salt, seasoned salt, table salt, and sea salt. Worcestershire sauce. Tartar sauce. Barbecue sauce. Teriyaki sauce. Soy sauce, including reduced-sodium. Steak sauce. Canned and packaged gravies. Fish sauce. Oyster sauce. Cocktail sauce. Horseradish that you find on the shelf. Ketchup. Mustard. Meat flavorings and tenderizers. Bouillon cubes. Hot sauce and Tabasco sauce. Premade or packaged marinades. Premade or packaged taco seasonings. Relishes. Regular salad dressings. Where to find more information:  National Heart, Lung, and Blood Institute: www.nhlbi.nih.gov  American Heart Association: www.heart.org Summary  The DASH eating plan is a healthy eating plan that has been shown to reduce high blood pressure (hypertension). It may also reduce your risk for type 2 diabetes, heart disease, and stroke.  With the DASH eating plan, you should limit salt (sodium) intake to 2,300 mg a day. If you have hypertension, you may need to reduce your sodium intake to 1,500 mg a day.  When on the DASH eating plan, aim to eat more fresh fruits and vegetables, whole grains, lean proteins, low-fat dairy, and heart-healthy fats.  Work with your health care provider or diet and nutrition specialist (dietitian) to adjust your eating plan to your individual   calorie needs. This information is not intended to replace advice given to you by your health care provider. Make sure you discuss any questions you have with your health care provider. Document Released: 09/29/2011 Document Revised: 10/03/2016 Document Reviewed: 10/03/2016 Elsevier Interactive Patient Education  2018 [employer]  

## 2018-10-04 ENCOUNTER — Telehealth: Payer: Self-pay | Admitting: Physician Assistant

## 2018-10-04 NOTE — Telephone Encounter (Signed)
[facility]  [phone]  meloxicam (MOBIC) 15 MG tablet - currently taking sertraline (ZOLOFT) 50 MG tablet - prescribed on Wed.  Needing to discuss drug interaction - increased bleed risk.  Please advise.  Thanks, [employer]

## 2018-10-05 NOTE — Telephone Encounter (Signed)
It is not a serious risk. Ok to take together.

## 2018-10-08 NOTE — Telephone Encounter (Signed)
Pharmacy advised  

## 2018-10-19 DIAGNOSIS — Z1231 Encounter for screening mammogram for malignant neoplasm of breast: Secondary | ICD-10-CM | POA: Diagnosis not present

## 2018-10-19 DIAGNOSIS — Z780 Asymptomatic menopausal state: Secondary | ICD-10-CM | POA: Diagnosis not present

## 2018-10-19 DIAGNOSIS — M810 Age-related osteoporosis without current pathological fracture: Secondary | ICD-10-CM | POA: Diagnosis not present

## 2018-10-19 LAB — HM MAMMOGRAPHY

## 2018-10-19 LAB — HM DEXA SCAN

## 2018-10-31 ENCOUNTER — Encounter: Payer: Self-pay | Admitting: *Deleted

## 2018-10-31 ENCOUNTER — Encounter: Payer: Self-pay | Admitting: Nurse Practitioner

## 2018-10-31 ENCOUNTER — Ambulatory Visit
Admission: RE | Admit: 2018-10-31 | Discharge: 2018-10-31 | Disposition: A | Discharge status: Home / Self Care | Payer: Medicare Other | Source: Ambulatory Visit | Attending: Nurse Practitioner | Admitting: Nurse Practitioner

## 2018-10-31 ENCOUNTER — Ambulatory Visit (HOSPITAL_BASED_OUTPATIENT_CLINIC_OR_DEPARTMENT_OTHER): Payer: Medicare Other | Admitting: Nurse Practitioner

## 2018-10-31 ENCOUNTER — Ambulatory Visit
Admission: RE | Admit: 2018-10-31 | Discharge: 2018-10-31 | Disposition: A | Discharge status: Home / Self Care | Payer: Medicare Other | Attending: Nurse Practitioner | Admitting: Nurse Practitioner

## 2018-10-31 VITALS — BP 174/70 | HR 91 | Temp 98.2°F | Resp 16 | Ht 59.0 in | Wt 104.0 lb

## 2018-10-31 DIAGNOSIS — M1612 Unilateral primary osteoarthritis, left hip: Secondary | ICD-10-CM | POA: Insufficient documentation

## 2018-10-31 DIAGNOSIS — M545 Low back pain: Secondary | ICD-10-CM | POA: Insufficient documentation

## 2018-10-31 DIAGNOSIS — M47816 Spondylosis without myelopathy or radiculopathy, lumbar region: Secondary | ICD-10-CM | POA: Insufficient documentation

## 2018-10-31 DIAGNOSIS — M533 Sacrococcygeal disorders, not elsewhere classified: Secondary | ICD-10-CM | POA: Insufficient documentation

## 2018-10-31 DIAGNOSIS — M549 Dorsalgia, unspecified: Secondary | ICD-10-CM | POA: Diagnosis not present

## 2018-10-31 DIAGNOSIS — S3992XA Unspecified injury of lower back, initial encounter: Secondary | ICD-10-CM | POA: Diagnosis not present

## 2018-10-31 DIAGNOSIS — G8929 Other chronic pain: Secondary | ICD-10-CM

## 2018-10-31 DIAGNOSIS — G894 Chronic pain syndrome: Secondary | ICD-10-CM | POA: Insufficient documentation

## 2018-10-31 DIAGNOSIS — M797 Fibromyalgia: Secondary | ICD-10-CM

## 2018-10-31 MED ORDER — HYDROCODONE-ACETAMINOPHEN 7.5-325 MG PO TABS: 1.0000 | ORAL_TABLET | Freq: Four times a day (QID) | ORAL | 0 refills | Status: DC | PRN

## 2018-10-31 MED ORDER — PREGABALIN 150 MG PO CAPS: 150.0000 mg | ORAL_CAPSULE | Freq: Three times a day (TID) | ORAL | 0 refills | Status: DC

## 2018-10-31 MED ORDER — MELOXICAM 15 MG PO TABS: 15.0000 mg | ORAL_TABLET | Freq: Every day | ORAL | 0 refills | Status: DC

## 2018-10-31 NOTE — Patient Instructions (Addendum)
____________________________________________________________________________________________  Medication Rules  Purpose: To inform patients, and their family members, of our rules and regulations.  Applies to: All patients receiving prescriptions (written or electronic).  Pharmacy of record: Pharmacy where electronic prescriptions will be sent. If written prescriptions are taken to a different pharmacy, please inform the nursing staff. The pharmacy listed in the electronic medical record should be the one where you would like electronic prescriptions to be sent.  Electronic prescriptions: In compliance with the [place] Strengthen Opioid Misuse Prevention (STOP) Act of 2017 (Session Law 2017-74/H243), effective October 24, 2018, all controlled substances must be electronically prescribed. Calling prescriptions to the pharmacy will cease to exist.  Prescription refills: Only during scheduled appointments. Applies to all prescriptions.  NOTE: The following applies primarily to controlled substances (Opioid* Pain Medications).   Patient's responsibilities: 1. Pain Pills: Bring all pain pills to every appointment (except for procedure appointments). 2. Pill Bottles: Bring pills in original pharmacy bottle. Always bring the newest bottle. Bring bottle, even if empty. 3. Medication refills: You are responsible for knowing and keeping track of what medications you take and those you need refilled. The day before your appointment: write a list of all prescriptions that need to be refilled. The day of the appointment: give the list to the admitting nurse. Prescriptions will be written only during appointments. If you forget a medication: it will not be "Called in", "Faxed", or "electronically sent". You will need to get another appointment to get these prescribed. No early refills. Do not call asking to have your prescription filled early. 4. Prescription Accuracy: You are responsible for  carefully inspecting your prescriptions before leaving our office. Have the discharge nurse carefully go over each prescription with you, before taking them home. Make sure that your name is accurately spelled, that your address is correct. Check the name and dose of your medication to make sure it is accurate. Check the number of pills, and the written instructions to make sure they are clear and accurate. Make sure that you are given enough medication to last until your next medication refill appointment. 5. Taking Medication: Take medication as prescribed. When it comes to controlled substances, taking less pills or less frequently than prescribed is permitted and encouraged. Never take more pills than instructed. Never take medication more frequently than prescribed.  6. Inform other Doctors: Always inform, all of your healthcare providers, of all the medications you take. 7. Pain Medication from other Providers: You are not allowed to accept any additional pain medication from any other Doctor or Healthcare provider. There are two exceptions to this rule. (see below) In the event that you require additional pain medication, you are responsible for notifying us, as stated below. 8. Medication Agreement: You are responsible for carefully reading and following our Medication Agreement. This must be signed before receiving any prescriptions from our practice. Safely store a copy of your signed Agreement. Violations to the Agreement will result in no further prescriptions. (Additional copies of our Medication Agreement are available upon request.) 9. Laws, Rules, & Regulations: All patients are expected to follow all Federal and State Laws, Statutes, Rules, & Regulations. Ignorance of the Laws does not constitute a valid excuse. The use of any illegal substances is prohibited. 10. Adopted CDC guidelines & recommendations: Target dosing levels will be at or below 60 MME/day. Use of benzodiazepines** is not  recommended.  Exceptions: There are only two exceptions to the rule of not receiving pain medications from other Healthcare Providers. 1.   Exception #1 (Emergencies): In the event of an emergency (i.e.: accident requiring emergency care), you are allowed to receive additional pain medication. However, you are responsible for: As soon as you are able, call our office [phone], at any time of the day or night, and leave a message stating your name, the date and nature of the emergency, and the name and dose of the medication prescribed. In the event that your call is answered by a member of our staff, make sure to document and save the date, time, and the name of the person that took your information.  2. Exception #2 (Planned Surgery): In the event that you are scheduled by another doctor or dentist to have any type of surgery or procedure, you are allowed (for a period no longer than 30 days), to receive additional pain medication, for the acute post-op pain. However, in this case, you are responsible for picking up a copy of our "Post-op Pain Management for Surgeons" handout, and giving it to your surgeon or dentist. This document is available at our office, and does not require an appointment to obtain it. Simply go to our office during business hours (Monday-Thursday from 8:00 AM to 4:00 PM) (Friday 8:00 AM to 12:00 Noon) or if you have a scheduled appointment with [place], prior to your surgery, and ask for it by name. In addition, you will need to provide [place] with your name, name of your surgeon, type of surgery, and date of procedure or surgery.  *Opioid medications include: morphine, codeine, oxycodone, oxymorphone, hydrocodone, hydromorphone, meperidine, tramadol, tapentadol, buprenorphine, fentanyl, methadone. **Benzodiazepine medications include: diazepam (Valium), alprazolam (Xanax), clonazepam (Klonopine), lorazepam (Ativan), clorazepate (Tranxene), chlordiazepoxide (Librium), estazolam (Prosom),  oxazepam (Serax), temazepam (Restoril), triazolam (Halcion) (Last updated: 12/21/2017) ____________________________________________________________________________________________   Hydrocodone apap 7.5-325 mg x 3 months escribed to your pharmacy  11/12/18, 12/12/18, 01/11/19  Will change meloxicam and Lyrica to [facility] mail order   Called and cancelled Rx for meloxicam and Lyrica that was sent to [facility] d/t patient's choice of wanting these sent to [facility]. Voicemail left.

## 2018-10-31 NOTE — Progress Notes (Signed)
Nursing Pain Medication Assessment:  Safety precautions to be maintained throughout the outpatient stay will include: orient to surroundings, keep bed in low position, maintain call bell within reach at all times, provide assistance with transfer out of bed and ambulation.  Medication Inspection Compliance: Pill count conducted under aseptic conditions, in front of the patient. Neither the pills nor the bottle was removed from the patient's sight at any time. Once count was completed pills were immediately returned to the patient in their original bottle.  Medication: Hydrocodone/APAP Pill/Patch Count: 67 of 120 pills remain Pill/Patch Appearance: Markings consistent with prescribed medication Bottle Appearance: Standard pharmacy container. Clearly labeled. Filled Date: 81 / 21 / 2019 Last Medication intake:  Today

## 2018-10-31 NOTE — Progress Notes (Addendum)
Patient's Name: Suzanne Avila  MRN: [id number]  Referring Provider: Burnette, Jennifer M, P*  DOB: 06/20/1946  PCP: Burnette, Jennifer M, PA-C  DOS: 10/31/2018  Note by: Crystal M. King NP  Service setting: Ambulatory outpatient  Specialty: Interventional Pain Management  Location: [facility] (AMB) Pain Management Facility    Patient type: Established    Primary Reason(s) for Visit: Encounter for prescription drug management. (Level of risk: moderate)  CC: Back Pain (lower left )  HPI  Suzanne Avila is a 72 y.o. year old, adult patient, who comes today for a medication management evaluation. 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); Encounter for therapeutic drug level monitoring; Long term current use of opiate analgesic; Long term prescription opiate use; Uncomplicated opioid dependence (HCC); Opiate use (30 MME/Day); Chronic low back pain (Location of Secondary source of pain) (Bilateral) (L>R); Chronic pain syndrome; Alcoholic (HCC); Allergic rhinitis; Atrial paroxysmal tachycardia (HCC); B12 deficiency; CAFL (chronic airflow limitation) (HCC); Narrowing of intervertebral disc space; Encounter for screening for diabetes mellitus; Acid reflux; Glaucoma; History of metabolic disorder; Benign essential HTN; Fungal infection of toenail; Osteopenia; Hypercholesterolemia without hypertriglyceridemia; Compulsive tobacco user syndrome; Absence of bladder continence; Osteoarthritis of hip (Left); Chronic hip pain (Left); Chronic lumbar radicular pain (L5 dermatome) (Location of Primary Source of Pain) (Bilateral) (L>R); Lumbar facet syndrome (Location of Primary Source of Pain) (Bilateral) (L>R); Lumbar spondylosis; 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 His primarily concern today is the Back Pain (lower left )  Pain Assessment: Location: Lower, Left Back Radiating: denies  Onset: More than a month ago Duration: Chronic pain Quality: Discomfort, Burning Severity: 6 /10 (subjective, self-reported pain score)  Note: Reported level is compatible with observation. Clinically the patient looks like a 2/10 A 2/10 is viewed as "Mild to Moderate" and described as noticeable and distracting. Impossible to hide from other people. More frequent flare-ups. Still possible to adapt and function close to normal. It can be very annoying and may have occasional stronger flare-ups. With discipline, patients may get used to it and adapt.       When using our objective Pain Scale, levels between 6 and 10/10 are said to belong in an emergency room, as it progressively worsens from a 6/10, described as severely limiting, requiring emergency care not usually available at an outpatient pain management facility. At a 6/10 level, communication becomes difficult and requires great effort. Assistance to reach the emergency department may be required. Facial flushing and profuse sweating along with potentially dangerous increases in heart rate and blood pressure will be evident. Effect on ADL: has to stay in one position for sleep and doesn't feel it again until she awakens and has to move.  Timing: Intermittent Modifying factors: medications take the edge off.   BP: (!) 174/70  HR: 91  Suzanne Avila was last scheduled for an appointment on 08/26/2018 for medication management. During today's appointment we reviewed Suzanne Avila's chronic pain status, as well as his outpatient medication regimen.  She  suffered a fall and is currently having pain on her left lower back.  She   did not seek any treatment after the fall.  Denies any leg pain.  Denies any changes in bowel or bladder.  The patient  reports no history of drug use. His body mass index is 21.01 kg/m.  Further details on both, my assessment(s), as well as the proposed treatment plan, please see below.  Controlled Substance Pharmacotherapy Assessment REMS (Risk Evaluation and Mitigation Strategy)  Analgesic:Hydrocodone/APAP 7.5/325 one every 6 hours (30 mg per day) MME/day:30 mg/day Patterson, Delores G, RN  10/31/2018 10:55 AM  Signed Nursing Pain Medication Assessment:  Safety precautions to be maintained throughout the outpatient stay will include: orient to surroundings, keep bed in low position, maintain call bell within reach at all times, provide assistance with transfer out of bed and ambulation.  Medication Inspection Compliance: Pill count conducted under aseptic conditions, in front of the patient. Neither the pills nor the bottle was removed from the patient's sight at any time. Once count was completed pills were immediately returned to the patient in their original bottle.  Medication: Hydrocodone/APAP Pill/Patch Count: 67 of 120 pills remain Pill/Patch Appearance: Markings consistent with prescribed medication Bottle Appearance: Standard pharmacy container. Clearly labeled. Filled Date: 12 / 21 / 2019 Last Medication intake:  Today   Pharmacokinetics: Liberation and absorption (onset of action): WNL Distribution (time to peak effect): WNL Metabolism and excretion (duration of action): WNL         Pharmacodynamics: Desired effects: Analgesia: Suzanne Avila reports >50% benefit. Functional ability: Patient reports that medication allows him to accomplish basic ADLs Clinically meaningful improvement in function (CMIF): Sustained CMIF goals met Perceived effectiveness: Described as relatively effective, allowing for  increase in activities of daily living (ADL) Undesirable effects: Side-effects or Adverse reactions: None reported Monitoring: [place] PMP: Online review of the past 12-month period conducted. Compliant with practice rules and regulations Last UDS on record: Summary  Date Value Ref Range Status  08/02/2018 FINAL  Final    Comment:    ==================================================================== TOXASSURE SELECT 13 (MW) ==================================================================== Test                             Result       Flag       Units Drug Present and Declared for Prescription Verification   Hydrocodone                    3379         EXPECTED   ng/mg creat   Hydromorphone                  1755         EXPECTED   ng/mg creat   Dihydrocodeine                 230          EXPECTED   ng/mg creat   Norhydrocodone                 1521         EXPECTED   ng/mg creat    Sources of hydrocodone include scheduled prescription    medications. Hydromorphone, dihydrocodeine and norhydrocodone are    expected metabolites of hydrocodone. Hydromorphone and    dihydrocodeine are also available as scheduled prescription    medications. ==================================================================== Test                      Result    Flag     Units      Ref Range   Creatinine              33               mg/dL      >=20 ==================================================================== Declared Medications:  The flagging and interpretation on this report are based on the  following declared medications.  Unexpected results may arise from  inaccuracies in the declared medications.  **Note: The testing scope of this panel includes these medications:  Hydrocodone (Norco)  **Note: The testing scope of this panel does not include following  reported medications:  Acetaminophen (Norco)  Albuterol  Aspirin  Atorvastatin (Lipitor)  Cyanocobalamin  Diltiazem (Cardizem)   Dorzolamide  Fluticasone (Advair)  Latanoprost (Xalatan)  Meloxicam (Mobic)  Omeprazole (Prilosec)  Potassium (Klor-Con)  Pregabalin (Lyrica)  Ramipril (Altace)  Ropinirole (Requip)  Salmeterol (Advair)  Spironolactone (Aldactone)  Vitamin D ==================================================================== For clinical consultation, please call [phone]. ====================================================================    UDS interpretation: Compliant          Medication Assessment Form: Reviewed. Patient indicates being compliant with therapy Treatment compliance: Compliant Risk Assessment Profile: Aberrant behavior: See prior evaluations. None observed or detected today Comorbid factors increasing risk of overdose: See prior notes. No additional risks detected today Opioid risk tool (ORT) (Total Score): 3 Personal History of Substance Abuse (SUD-Substance use disorder):  Alcohol: Negative  Illegal Drugs: Negative  Rx Drugs: Negative  ORT Risk Level calculation: Low Risk Risk of substance use disorder (SUD): Low Opioid Risk Tool - 10/31/18 0948      Family History of Substance Abuse   Alcohol  Negative    Illegal Drugs  Negative    Rx Drugs  Negative      Personal History of Substance Abuse   Alcohol  Negative    Illegal Drugs  Negative    Rx Drugs  Negative      Age   Age between 16-45 years   No      Psychological Disease   Psychological Disease  Positive    ADD  Negative    OCD  Negative    Bipolar  Negative    Schizophrenia  Negative    Depression  Positive      Total Score   Opioid Risk Tool Scoring  3    Opioid Risk Interpretation  Low Risk      ORT Scoring interpretation table:  Score <3 = Low Risk for SUD  Score between 4-7 = Moderate Risk for SUD  Score >8 = High Risk for Opioid Abuse   Risk Mitigation Strategies:  Patient Counseling: Covered Patient-Prescriber Agreement (PPA): Present and active  Notification to other healthcare  providers: Done  Pharmacologic Plan: No change in therapy, at this time.             Laboratory Chemistry  Inflammation Markers (CRP: Acute Phase) (ESR: Chronic Phase) Lab Results  Component Value Date   CRP <0.8 02/14/2017   ESRSEDRATE 20 02/14/2017                         Rheumatology Markers Lab Results  Component Value Date   RF <10.0 06/14/2016   ANA Negative 06/14/2016                        Renal Function Markers Lab Results  Component Value Date   BUN 9 01/01/2018   CREATININE 0.75 01/01/2018     BCR 12 01/01/2018   GFRAA 92 01/01/2018   GFRNONAA 80 01/01/2018                             Hepatic Function Markers Lab Results  Component Value Date   AST 13 01/01/2018   ALT 8 01/01/2018   ALBUMIN 4.2 01/01/2018   ALKPHOS 100 01/01/2018                        Electrolytes Lab Results  Component Value Date   NA 143 01/01/2018   K 4.3 01/01/2018   CL 102 01/01/2018   CALCIUM 9.4 01/01/2018   MG 1.7 02/14/2017                        Neuropathy Markers Lab Results  Component Value Date   VITAMINB12 1,427 (H) 01/01/2018                        CNS Tests No results found for: COLORCSF, APPEARCSF, RBCCOUNTCSF, WBCCSF, POLYSCSF, LYMPHSCSF, EOSCSF, PROTEINCSF, GLUCCSF, JCVIRUS, CSFOLI, IGGCSF                      Bone Pathology Markers Lab Results  Component Value Date   VD25OH 31.8 01/01/2018                         Coagulation Parameters Lab Results  Component Value Date   PLT 212 01/01/2018                        Cardiovascular Markers Lab Results  Component Value Date   BNP 78.2 06/14/2016   CKTOTAL 120 06/14/2016   HGB 14.2 01/01/2018   HCT 42.6 01/01/2018                         CA Markers No results found for: CEA, CA125, LABCA2                      Note: Lab results reviewed.  Recent Diagnostic Imaging Results  CT CHEST LCS NODULE F/U W/O CONTRAST CLINICAL DATA:  73 year old female with 65 pack-year history of smoking. Lung cancer  screening.  EXAM: CT CHEST WITHOUT CONTRAST FOR LUNG CANCER SCREENING NODULE FOLLOW-UP  TECHNIQUE: Multidetector CT imaging of the chest was performed following the standard protocol without IV contrast.  COMPARISON:  01/04/2018  FINDINGS: Cardiovascular: The heart size is normal. No substantial pericardial effusion. Coronary artery calcification is evident. Atherosclerotic calcification is noted in the wall of the thoracic aorta.  Mediastinum/Nodes: No mediastinal lymphadenopathy. No evidence for gross hilar lymphadenopathy although assessment is limited by the lack of intravenous contrast on today's study. The esophagus has normal imaging features. There is no axillary lymphadenopathy.  Lungs/Pleura: The central tracheobronchial airways are patent. Biapical pleuroparenchymal scarring evident. 7.1 mm part solid nodule right upper lobe is unchanged in the interval. No new or suspicious pulmonary nodule or mass. Subtle tree-in-bud nodularity peripheral right upper lobe is stable and likely reflects sequelae chronic atypical infection. No pleural effusion.  Upper Abdomen: Right kidney atrophic.  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  IMPRESSION: 1. Lung-RADS 2, benign appearance or behavior. Continue annual screening with low-dose chest CT without contrast in 12 months. 2.  Aortic Atherosclerois (  ICD10-170.0)  Electronically Signed   By: Misty Stanley M.D.   On: 07/09/2018 11:22  Complexity Note: Imaging results reviewed. Results shared with Suzanne Avila, using Layman's terms.                         Meds   Current Outpatient Medications:  .  albuterol (PROVENTIL HFA;VENTOLIN HFA) 108 (90 Base) MCG/ACT inhaler, ALBUTEROL, 90MCG/ACT (Inhalation Aerosol Solution)  2 PUFFS Every Day, As Needed for 0 days  Quantity: 0.00;  Refills: 0   Ordered :09-Jan-2014  Celene Kras, MA, Anastasiya ;  Started 10-Aug-2009 Active Comments: Medication  taken as needed. , Disp: , Rfl:  .  aspirin 325 MG tablet, Take 325 mg by mouth daily., Disp: , Rfl:  .  atorvastatin (LIPITOR) 40 MG tablet, Take 1 tablet (40 mg total) by mouth daily., Disp: 90 tablet, Rfl: 3 .  cholecalciferol (VITAMIN D) 1000 units tablet, Take 1 tablet (1,000 Units total) by mouth daily., Disp: 90 tablet, Rfl: 3 .  diltiazem (CARDIZEM) 120 MG tablet, Take 1 tablet (120 mg total) by mouth daily., Disp: 90 tablet, Rfl: 3 .  dorzolamide (TRUSOPT) 2 % ophthalmic solution, , Disp: , Rfl:  .  Fluticasone-Salmeterol (ADVAIR DISKUS) 250-50 MCG/DOSE AEPB, USE 1 INHALATION TWICE A DAY, Disp: 180 each, Rfl: 3 .  [START ON 11/12/2018] HYDROcodone-acetaminophen (NORCO) 7.5-325 MG tablet, Take 1 tablet by mouth every 6 (six) hours as needed for severe pain., Disp: 120 tablet, Rfl: 0 .  latanoprost (XALATAN) 0.005 % ophthalmic solution, 1 drop at bedtime., Disp: , Rfl:  .  [START ON 11/12/2018] meloxicam (MOBIC) 15 MG tablet, Take 1 tablet (15 mg total) by mouth daily., Disp: 90 tablet, Rfl: 0 .  omeprazole (PRILOSEC) 40 MG capsule, Take 1 capsule (40 mg total) by mouth daily., Disp: 90 capsule, Rfl: 0 .  potassium chloride (KLOR-CON) 20 MEQ packet, , Disp: , Rfl:  .  [START ON 11/12/2018] pregabalin (LYRICA) 150 MG capsule, Take 1 capsule (150 mg total) by mouth every 8 (eight) hours., Disp: 270 capsule, Rfl: 0 .  ramipril (ALTACE) 10 MG capsule, Take 1 capsule (10 mg total) by mouth daily., Disp: 90 capsule, Rfl: 3 .  rOPINIRole (REQUIP XL) 2 MG 24 hr tablet, Take 1 tablet (2 mg total) by mouth at bedtime., Disp: 90 tablet, Rfl: 3 .  sertraline (ZOLOFT) 50 MG tablet, Take 1 tablet (50 mg total) by mouth daily., Disp: 30 tablet, Rfl: 3 .  spironolactone (ALDACTONE) 25 MG tablet, Take 1 tablet (25 mg total) by mouth daily., Disp: 90 tablet, Rfl: 0 .  vitamin B-12 (CYANOCOBALAMIN) 500 MCG tablet, Take 1 tablet (500 mcg total) by mouth daily., Disp: 90 tablet, Rfl: 0 .  [START ON 01/11/2019]  HYDROcodone-acetaminophen (NORCO) 7.5-325 MG tablet, Take 1 tablet by mouth every 6 (six) hours as needed for severe pain., Disp: 120 tablet, Rfl: 0 .  [START ON 12/12/2018] HYDROcodone-acetaminophen (NORCO) 7.5-325 MG tablet, Take 1 tablet by mouth every 6 (six) hours as needed for severe pain., Disp: 120 tablet, Rfl: 0  ROS  Constitutional: Denies any fever or chills Gastrointestinal: No reported hemesis, hematochezia, vomiting, or acute GI distress Musculoskeletal: Denies any acute onset joint swelling, redness, loss of ROM, or weakness Neurological: No reported episodes of acute onset apraxia, aphasia, dysarthria, agnosia, amnesia, paralysis, loss of coordination, or loss of consciousness  Allergies  Suzanne Avila is allergic to codeine; cyclobenzaprine; and penicillins.  [place]  Drug: Suzanne Avila  reports  no history of drug use. Alcohol:  reports current alcohol use of about 42.0 standard drinks of alcohol per week. Tobacco:  reports that he has been smoking cigarettes. He has a 50.00 pack-year smoking history. He has never used smokeless tobacco. Medical:  has a past medical history of Glaucoma, Hyperlipidemia, and Hypertension. Surgical: Suzanne Avila  has a past surgical history that includes Back surgery; Eye surgery; and Carpal tunnel release (Left). Family: family history includes Cancer in his father and mother.  Constitutional Exam  General appearance: Well nourished, well developed, and well hydrated. In no apparent acute distress Vitals:   10/31/18 0939  BP: (!) 174/70  Pulse: 91  Resp: 16  Temp: 98.2 F (36.8 C)  TempSrc: Oral  SpO2: 98%  Weight: 104 lb (47.2 kg)  Height: 4' 11" (1.499 m)  Psych/Mental status: Alert, oriented x 3 (person, place, & time)       Eyes: PERLA Respiratory: No evidence of acute respiratory distress  Lumbar Spine Area Exam  Skin & Axial Inspection: Well healed scar from previous spine surgery detected Alignment: Symmetrical Functional ROM:  Unrestricted ROM       Stability: No instability detected Muscle Tone/Strength: Functionally intact. No obvious neuro-muscular anomalies detected. Sensory (Neurological): Unimpaired Palpation: Complains of area being tender to palpation       Provocative Tests: Hyperextension/rotation test: Positive on the left for facet joint pain. Lumbar quadrant test (Kemp's test): deferred today       Lateral bending test: (+)      left Patrick's Maneuver: (+) for left-sided S-I arthralgia              Gait & Posture Assessment  Ambulation: Unassisted Gait: Antalgic Posture: Antalgic   Lower Extremity Exam    Side: Right lower extremity  Side: Left lower extremity  Stability: No instability observed          Stability: No instability observed          Skin & Extremity Inspection: Skin color, temperature, and hair growth are WNL. No peripheral edema or cyanosis. No masses, redness, swelling, asymmetry, or associated skin lesions. No contractures.  Skin & Extremity Inspection: Skin color, temperature, and hair growth are WNL. No peripheral edema or cyanosis. No masses, redness, swelling, asymmetry, or associated skin lesions. No contractures.  Functional ROM: Unrestricted ROM                  Functional ROM: Unrestricted ROM                  Muscle Tone/Strength: Functionally intact. No obvious neuro-muscular anomalies detected.  Muscle Tone/Strength: Functionally intact. No obvious neuro-muscular anomalies detected.  Sensory (Neurological): Unimpaired        Sensory (Neurological): Unimpaired            Palpation: No palpable anomalies  Palpation: No palpable anomalies   Assessment  Primary Diagnosis & Pertinent Problem List: The primary encounter diagnosis was Lumbar spondylosis. Diagnoses of Arthropathy of left hip, Fibromyalgia, Chronic pain syndrome, and Chronic sacroiliac joint pain were also pertinent to this visit.  Status Diagnosis  Worsening Controlled Controlled 1. Lumbar spondylosis    2. Arthropathy of left hip   3. Fibromyalgia   4. Chronic pain syndrome   5. Chronic sacroiliac joint pain     Problems updated and reviewed during this visit: No problems updated. Plan of Care  Pharmacotherapy (Medications Ordered): Meds ordered this encounter  Medications  . HYDROcodone-acetaminophen (NORCO) 7.5-325 MG tablet      Sig: Take 1 tablet by mouth every 6 (six) hours as needed for severe pain.    Dispense:  120 tablet    Refill:  0    Do not place this medication, or any other prescription from our practice, on "Automatic Refill". Patient may have prescription filled one day early if pharmacy is closed on scheduled refill date.    Order Specific Question:   Supervising Provider    Answer:   Milinda Pointer [phone]  . HYDROcodone-acetaminophen (NORCO) 7.5-325 MG tablet    Sig: Take 1 tablet by mouth every 6 (six) hours as needed for severe pain.    Dispense:  120 tablet    Refill:  0    Do not place this medication, or any other prescription from our practice, on "Automatic Refill". Patient may have prescription filled one day early if pharmacy is closed on scheduled refill date.    Order Specific Question:   Supervising Provider    Answer:   Milinda Pointer [phone]  . HYDROcodone-acetaminophen (NORCO) 7.5-325 MG tablet    Sig: Take 1 tablet by mouth every 6 (six) hours as needed for severe pain.    Dispense:  120 tablet    Refill:  0    Do not place this medication, or any other prescription from our practice, on "Automatic Refill". Patient may have prescription filled one day early if pharmacy is closed on scheduled refill date.    Order Specific Question:   Supervising Provider    Answer:   Milinda Pointer [phone]  . DISCONTD: meloxicam (MOBIC) 15 MG tablet    Sig: Take 1 tablet (15 mg total) by mouth daily.    Dispense:  90 tablet    Refill:  0    Patient may have prescription filled one day early if pharmacy is closed on scheduled refill date. 3 month  refill.    Order Specific Question:   Supervising Provider    Answer:   Milinda Pointer [phone]  . DISCONTD: pregabalin (LYRICA) 150 MG capsule    Sig: Take 1 capsule (150 mg total) by mouth every 8 (eight) hours.    Dispense:  270 capsule    Refill:  0    Do not place this medication, or any other prescription from our practice, on "Automatic Refill". Patient may have prescription filled one day early if pharmacy is closed on scheduled refill date.    Order Specific Question:   Supervising Provider    Answer:   Milinda Pointer [phone]  . meloxicam (MOBIC) 15 MG tablet    Sig: Take 1 tablet (15 mg total) by mouth daily.    Dispense:  90 tablet    Refill:  0    Patient may have prescription filled one day early if pharmacy is closed on scheduled refill date. 3 month refill.    Order Specific Question:   Supervising Provider    Answer:   Milinda Pointer [phone]  . pregabalin (LYRICA) 150 MG capsule    Sig: Take 1 capsule (150 mg total) by mouth every 8 (eight) hours.    Dispense:  270 capsule    Refill:  0    Do not place this medication, or any other prescription from our practice, on "Automatic Refill". Patient may have prescription filled one day early if pharmacy is closed on scheduled refill date.    Order Specific Question:   Supervising Provider    Answer:   Milinda Pointer [[id number]]   New Prescriptions   No medications  on file   Medications administered today: Shantinique Picazo. Suzanne Avila had no medications administered during this visit. Lab-work, procedure(s), and/or referral(s): Orders Placed This Encounter  Procedures  . DG Lumbar Spine Complete W/Bend  . DG Si Joints   Imaging and/or referral(s): None  Interventional therapies: Planned, scheduled, and/or pending: Not at this time.   Considering: Diagnostic bilateral intra-articular hip joint injection  Possible bilateral hip joint radiofrequency ablation. Diagnostic bilateral lumbar facet block   Possible bilateral lumbar facet radiofrequency ablation  Diagnostic bilateral L4-L5 transforaminal epidural steroid injection  Diagnostic caudal epidural steroid injection  Diagnostic caudal epidurogram  Possible right ask epidural lysis of adhesions.  Diagnostic left-sided L2-3 lumbar epidural steroid injection  Possible bilateral Lumbar spinal cord stimulator trial implant.    Palliative PRN treatment(s): Diagnostic bilateral intra-articular hip joint injection  Diagnostic bilateral lumbar facet block  Diagnostic bilateral L4-L5 transforaminal epidural steroid injection  Diagnostic caudal epidural steroid injection  Diagnostic caudal epidurogram  Diagnostic left-sided L2-3 lumbar epidural steroid injection    Provider-requested follow-up: Return in about 3 months (around 01/30/2019) for MedMgmt.  Future Appointments  Date Time Provider [place]  01/03/2019  9:20 AM [facility] HEALTH ADVISOR [facility] None  01/09/2019 10:00 AM Mar Daring, PA-C [facility] None  01/28/2019  9:00 AM Vevelyn Francois, NP [facility] None  07/02/2019  9:30 AM [facility] V[facility] 2 [facility] None  07/02/2019 10:30 AM Dew, Erskine Squibb, MD [facility] None   Primary Care Physician: Mar Daring, PA-C Location: [facility] Note by: Vevelyn Francois NP Date: 10/31/2018; Time: 1:03 PM  Pain Score Disclaimer: We use the NRS-11 scale. This is a self-reported, subjective measurement of pain severity with only modest accuracy. It is used primarily to identify changes within a particular patient. It must be understood that outpatient pain scales are significantly less accurate that those used for research, where they can be applied under ideal controlled circumstances with minimal exposure to variables. In reality, the score is likely to be a combination of pain intensity and pain affect, where pain affect describes the degree of emotional arousal or changes in action readiness caused by  the sensory experience of pain. Factors such as social and work situation, setting, emotional state, anxiety levels, expectation, and prior pain experience may influence pain perception and show large inter-individual differences that may also be affected by time variables.  Patient instructions provided during this appointment: Patient Instructions  ____________________________________________________________________________________________  Medication Rules  Purpose: To inform patients, and their family members, of our rules and regulations.  Applies to: All patients receiving prescriptions (written or electronic).  Pharmacy of record: Pharmacy where electronic prescriptions will be sent. If written prescriptions are taken to a different pharmacy, please inform the nursing staff. The pharmacy listed in the electronic medical record should be the one where you would like electronic prescriptions to be sent.  Electronic prescriptions: In compliance with the [place] (STOP) Act of 2017 (Session Lanny Cramp [phone]), effective October 24, 2018, all controlled substances must be electronically prescribed. Calling prescriptions to the pharmacy will cease to exist.  Prescription refills: Only during scheduled appointments. Applies to all prescriptions.  NOTE: The following applies primarily to controlled substances (Opioid* Pain Medications).   Patient's responsibilities: 1. Pain Pills: Bring all pain pills to every appointment (except for procedure appointments). 2. Pill Bottles: Bring pills in original pharmacy bottle. Always bring the newest bottle. Bring bottle, even if empty. 3. Medication refills: You are responsible for knowing  and keeping track of what medications you take and those you need refilled. The day before your appointment: write a list of all prescriptions that need to be refilled. The day of the appointment: give the list to the admitting  nurse. Prescriptions will be written only during appointments. If you forget a medication: it will not be "Called in", "Faxed", or "electronically sent". You will need to get another appointment to get these prescribed. No early refills. Do not call asking to have your prescription filled early. 4. Prescription Accuracy: You are responsible for carefully inspecting your prescriptions before leaving our office. Have the discharge nurse carefully go over each prescription with you, before taking them home. Make sure that your name is accurately spelled, that your address is correct. Check the name and dose of your medication to make sure it is accurate. Check the number of pills, and the written instructions to make sure they are clear and accurate. Make sure that you are given enough medication to last until your next medication refill appointment. 5. Taking Medication: Take medication as prescribed. When it comes to controlled substances, taking less pills or less frequently than prescribed is permitted and encouraged. Never take more pills than instructed. Never take medication more frequently than prescribed.  6. Inform other Doctors: Always inform, all of your healthcare providers, of all the medications you take. 7. Pain Medication from other Providers: You are not allowed to accept any additional pain medication from any other Doctor or Healthcare provider. There are two exceptions to this rule. (see below) In the event that you require additional pain medication, you are responsible for notifying us, as stated below. 8. Medication Agreement: You are responsible for carefully reading and following our Medication Agreement. This must be signed before receiving any prescriptions from our practice. Safely store a copy of your signed Agreement. Violations to the Agreement will result in no further prescriptions. (Additional copies of our Medication Agreement are available upon request.) 9. Laws, Rules, &  Regulations: All patients are expected to follow all Federal and State Laws, Statutes, Rules, & Regulations. Ignorance of the Laws does not constitute a valid excuse. The use of any illegal substances is prohibited. 10. Adopted CDC guidelines & recommendations: Target dosing levels will be at or below 60 MME/day. Use of benzodiazepines** is not recommended.  Exceptions: There are only two exceptions to the rule of not receiving pain medications from other Healthcare Providers. 1. Exception #1 (Emergencies): In the event of an emergency (i.e.: accident requiring emergency care), you are allowed to receive additional pain medication. However, you are responsible for: As soon as you are able, call our office [phone], at any time of the day or night, and leave a message stating your name, the date and nature of the emergency, and the name and dose of the medication prescribed. In the event that your call is answered by a member of our staff, make sure to document and save the date, time, and the name of the person that took your information.  2. Exception #2 (Planned Surgery): In the event that you are scheduled by another doctor or dentist to have any type of surgery or procedure, you are allowed (for a period no longer than 30 days), to receive additional pain medication, for the acute post-op pain. However, in this case, you are responsible for picking up a copy of our "Post-op Pain Management for Surgeons" handout, and giving it to your surgeon or dentist. This document is available at   our office, and does not require an appointment to obtain it. Simply go to our office during business hours (Monday-Thursday from 8:00 AM to 4:00 PM) (Friday 8:00 AM to 12:00 Noon) or if you have a scheduled appointment with us, prior to your surgery, and ask for it by name. In addition, you will need to provide us with your name, name of your surgeon, type of surgery, and date of procedure or surgery.  *Opioid  medications include: morphine, codeine, oxycodone, oxymorphone, hydrocodone, hydromorphone, meperidine, tramadol, tapentadol, buprenorphine, fentanyl, methadone. **Benzodiazepine medications include: diazepam (Valium), alprazolam (Xanax), clonazepam (Klonopine), lorazepam (Ativan), clorazepate (Tranxene), chlordiazepoxide (Librium), estazolam (Prosom), oxazepam (Serax), temazepam (Restoril), triazolam (Halcion) (Last updated: 12/21/2017) ____________________________________________________________________________________________   Hydrocodone apap 7.5-325 mg x 3 months escribed to your pharmacy  11/12/18, 12/12/18, 01/11/19  Will change meloxicam and Lyrica to [facility] mail order   Called and cancelled Rx for meloxicam and Lyrica that was sent to [facility] d/t patient's choice of wanting these sent to [facility]. Voicemail left.     

## 2018-11-06 NOTE — Progress Notes (Signed)
Patient: Suzanne HoopsJanet M Avila Adult    DOB: Jun 11, 1946   73 y.o.   MRN: [id number] Visit Date: 11/07/2018  Today's Provider: Margaretann LovelessJennifer M Curvin Hunger, PA-C   Chief Complaint  Patient presents with  . Back Pain   Subjective:     HPI   Patient states she fell 3 weeks ago on her right side. Patient states since she fell, she has been hurting in the center of her buttocks. Pain radiates around to left hip and groin. Patient states she did have x-rays done at pain management, but no abnormalities were found. Patient stated she felt sluggish/off right before she fell. She reports she had been tired and was going to bed. She made it to right beside her nightstand when she fell. Her son came and helped her up into bed. He talked with her and she remembers the conversation, but states she was talking to him with her eyes closed. She went to bed, the awoke the next day feeling normal.     Allergies  Allergen Reactions  . Codeine Itching  . Cyclobenzaprine Itching  . Penicillins     itching     Current Outpatient Medications:  .  albuterol (PROVENTIL HFA;VENTOLIN HFA) 108 (90 Base) MCG/ACT inhaler, ALBUTEROL, 90MCG/ACT (Inhalation Aerosol Solution)  2 PUFFS Every Day, As Needed for 0 days  Quantity: 0.00;  Refills: 0   Ordered :09-Jan-2014  Suzanne HaggardAleksandrova, MA, Anastasiya ;  Started 10-Aug-2009 Active Comments: Medication taken as needed. , Disp: , Rfl:  .  aspirin 325 MG tablet, Take 325 mg by mouth daily., Disp: , Rfl:  .  atorvastatin (LIPITOR) 40 MG tablet, Take 1 tablet (40 mg total) by mouth daily., Disp: 90 tablet, Rfl: 3 .  cholecalciferol (VITAMIN D) 1000 units tablet, Take 1 tablet (1,000 Units total) by mouth daily., Disp: 90 tablet, Rfl: 3 .  diltiazem (CARDIZEM) 120 MG tablet, Take 1 tablet (120 mg total) by mouth daily., Disp: 90 tablet, Rfl: 3 .  dorzolamide (TRUSOPT) 2 % ophthalmic solution, , Disp: , Rfl:  .  Fluticasone-Salmeterol (ADVAIR DISKUS) 250-50 MCG/DOSE AEPB, USE 1  INHALATION TWICE A DAY, Disp: 180 each, Rfl: 3 .  [START ON 11/12/2018] HYDROcodone-acetaminophen (NORCO) 7.5-325 MG tablet, Take 1 tablet by mouth every 6 (six) hours as needed for severe pain., Disp: 120 tablet, Rfl: 0 .  [START ON 01/11/2019] HYDROcodone-acetaminophen (NORCO) 7.5-325 MG tablet, Take 1 tablet by mouth every 6 (six) hours as needed for severe pain., Disp: 120 tablet, Rfl: 0 .  [START ON 12/12/2018] HYDROcodone-acetaminophen (NORCO) 7.5-325 MG tablet, Take 1 tablet by mouth every 6 (six) hours as needed for severe pain., Disp: 120 tablet, Rfl: 0 .  latanoprost (XALATAN) 0.005 % ophthalmic solution, 1 drop at bedtime., Disp: , Rfl:  .  [START ON 11/12/2018] meloxicam (MOBIC) 15 MG tablet, Take 1 tablet (15 mg total) by mouth daily., Disp: 90 tablet, Rfl: 0 .  omeprazole (PRILOSEC) 40 MG capsule, Take 1 capsule (40 mg total) by mouth daily., Disp: 90 capsule, Rfl: 0 .  potassium chloride (KLOR-CON) 20 MEQ packet, , Disp: , Rfl:  .  [START ON 11/12/2018] pregabalin (LYRICA) 150 MG capsule, Take 1 capsule (150 mg total) by mouth every 8 (eight) hours., Disp: 270 capsule, Rfl: 0 .  ramipril (ALTACE) 10 MG capsule, Take 1 capsule (10 mg total) by mouth daily., Disp: 90 capsule, Rfl: 3 .  rOPINIRole (REQUIP XL) 2 MG 24 hr tablet, Take 1 tablet (2 mg total)  by mouth at bedtime., Disp: 90 tablet, Rfl: 3 .  sertraline (ZOLOFT) 50 MG tablet, Take 1 tablet (50 mg total) by mouth daily., Disp: 30 tablet, Rfl: 3 .  spironolactone (ALDACTONE) 25 MG tablet, Take 1 tablet (25 mg total) by mouth daily., Disp: 90 tablet, Rfl: 0 .  vitamin B-12 (CYANOCOBALAMIN) 500 MCG tablet, Take 1 tablet (500 mcg total) by mouth daily., Disp: 90 tablet, Rfl: 0  Review of Systems  Constitutional: Negative for appetite change, chills, fatigue and fever.  Respiratory: Negative for chest tightness and shortness of breath.   Cardiovascular: Negative for chest pain and palpitations.  Gastrointestinal: Negative for abdominal  pain, nausea and vomiting.  Musculoskeletal: Positive for arthralgias, back pain, gait problem and myalgias.  Neurological: Positive for weakness and numbness. Negative for dizziness.    Social History   Tobacco Use  . Smoking status: Current Every Day Smoker    Packs/day: 1.00    Years: 50.00    Pack years: 50.00    Types: Cigarettes  . Smokeless tobacco: Never Used  Substance Use Topics  . Alcohol use: Yes    Alcohol/week: 42.0 standard drinks    Types: 42 Cans of beer per week      Objective:   BP (!) 158/68 (BP Location: Left Arm, Patient Position: Sitting, Cuff Size: Normal)   Pulse 78   Temp 98.1 F (36.7 C) (Oral)   Resp 16   Ht 4\' 11"  (1.499 m)   Wt 104 lb (47.2 kg)   SpO2 96%   BMI 21.01 kg/m  Vitals:   11/07/18 1227  BP: (!) 158/68  Pulse: 78  Resp: 16  Temp: 98.1 F (36.7 C)  TempSrc: Oral  SpO2: 96%  Weight: 104 lb (47.2 kg)  Height: 4\' 11"  (1.499 m)     Physical Exam Vitals signs reviewed.  Constitutional:      General: He is not in acute distress.    Appearance: He is well-developed. He is not diaphoretic.  Neck:     Musculoskeletal: Normal range of motion and neck supple.     Thyroid: No thyromegaly.     Vascular: No JVD.     Trachea: No tracheal deviation.  Cardiovascular:     Rate and Rhythm: Normal rate and regular rhythm.     Heart sounds: Normal heart sounds. No murmur. No friction rub. No gallop.   Pulmonary:     Effort: Pulmonary effort is normal. No respiratory distress.     Breath sounds: Normal breath sounds. No wheezing or rales.  Musculoskeletal:     Thoracic back: Normal.     Lumbar back: He exhibits decreased range of motion, tenderness, bony tenderness and pain. He exhibits no spasm and normal pulse.       Back:  Lymphadenopathy:     Cervical: No cervical adenopathy.        Assessment & Plan    1. Chronic left-sided low back pain with left-sided sciatica Acute exacerbation of chronic low back pain. Xray reviewed  and appears stable and unchanged from 2018. Followed by pain management for controlled narcotic medications. Will give steroid taper as below. Continue heating pad. Hold meloxicam while on steroid. Call if no improvements or symptoms worsen and will consider MRI.   - predniSONE (DELTASONE) 20 MG tablet; Take 80mg  (4 tabs) x 2 days, 60mg  (3 tabs) x 2 days, 40mg  (2 tabs) x 2 days, then 20mg  (1tab) x 2 days  Dispense: 22 tablet; Refill: 0  2. Fall, initial  encounter See above medical treatment plan. - predniSONE (DELTASONE) 20 MG tablet; Take 80mg  (4 tabs) x 2 days, 60mg  (3 tabs) x 2 days, 40mg  (2 tabs) x 2 days, then 20mg  (1tab) x 2 days  Dispense: 22 tablet; Refill: 0     Suzanne Loveless, PA-C  [facility]

## 2018-11-06 NOTE — Progress Notes (Signed)
Called patient. No answer, left message on a.m. that results was normal. Crystal states that she can have Toradol and Norflex if wanted. Instructed to call back if wanting injections

## 2018-11-06 NOTE — Progress Notes (Signed)
Results were reviewed and found to be: mildly abnormal  No acute injury or pathology identified  Review would suggest interventional pain management techniques may be of benefit 

## 2018-11-06 NOTE — Progress Notes (Signed)
Please call the patient and make her aware that of her xray results.

## 2018-11-07 ENCOUNTER — Encounter: Payer: Self-pay | Admitting: Physician Assistant

## 2018-11-07 ENCOUNTER — Ambulatory Visit (INDEPENDENT_AMBULATORY_CARE_PROVIDER_SITE_OTHER): Payer: Medicare Other | Admitting: Physician Assistant

## 2018-11-07 VITALS — BP 158/68 | HR 78 | Temp 98.1°F | Resp 16 | Ht 59.0 in | Wt 104.0 lb

## 2018-11-07 DIAGNOSIS — W19XXXA Unspecified fall, initial encounter: Secondary | ICD-10-CM | POA: Diagnosis not present

## 2018-11-07 DIAGNOSIS — G8929 Other chronic pain: Secondary | ICD-10-CM

## 2018-11-07 DIAGNOSIS — M5442 Lumbago with sciatica, left side: Secondary | ICD-10-CM | POA: Diagnosis not present

## 2018-11-07 MED ORDER — PREDNISONE 20 MG PO TABS: ORAL_TABLET | ORAL | 0 refills | Status: DC

## 2018-11-15 ENCOUNTER — Telehealth: Payer: Self-pay | Admitting: Physician Assistant

## 2018-11-15 DIAGNOSIS — M5414 Radiculopathy, thoracic region: Secondary | ICD-10-CM

## 2018-11-15 DIAGNOSIS — M961 Postlaminectomy syndrome, not elsewhere classified: Secondary | ICD-10-CM

## 2018-11-15 DIAGNOSIS — M48062 Spinal stenosis, lumbar region with neurogenic claudication: Secondary | ICD-10-CM

## 2018-11-15 DIAGNOSIS — M9979 Connective tissue and disc stenosis of intervertebral foramina of abdomen and other regions: Secondary | ICD-10-CM

## 2018-11-15 DIAGNOSIS — M47816 Spondylosis without myelopathy or radiculopathy, lumbar region: Secondary | ICD-10-CM

## 2018-11-15 DIAGNOSIS — M5441 Lumbago with sciatica, right side: Principal | ICD-10-CM

## 2018-11-15 DIAGNOSIS — G8929 Other chronic pain: Secondary | ICD-10-CM

## 2018-11-15 DIAGNOSIS — M5442 Lumbago with sciatica, left side: Principal | ICD-10-CM

## 2018-11-15 DIAGNOSIS — M5416 Radiculopathy, lumbar region: Secondary | ICD-10-CM

## 2018-11-15 NOTE — Telephone Encounter (Signed)
Pt stated she saw Antony Contras for OV on 11/07/2018. Pt stated she completed the medication and her pain hasn't Improved. Pt is requesting call back tomorrow after 2 pm to discuss next step and if she needs to go ahead with MRI. Please advise. Thanks TNP

## 2018-11-16 NOTE — Addendum Note (Signed)
Addended by: Margaretann Loveless on: 11/16/2018 01:20 PM   Modules accepted: Orders

## 2018-11-16 NOTE — Telephone Encounter (Signed)
Referral placed.

## 2018-11-16 NOTE — Telephone Encounter (Signed)
I will need to refer you to orthopedics. Would you like to see the previous ortho if they are still practicing or just get you in with someone locally.

## 2018-11-16 NOTE — Telephone Encounter (Signed)
Patient stated she wants to see Carmon Ginsberg.

## 2018-12-05 DIAGNOSIS — M5442 Lumbago with sciatica, left side: Secondary | ICD-10-CM | POA: Diagnosis not present

## 2018-12-05 DIAGNOSIS — M545 Low back pain: Secondary | ICD-10-CM | POA: Diagnosis not present

## 2018-12-20 ENCOUNTER — Telehealth: Payer: Self-pay

## 2018-12-20 DIAGNOSIS — M2012 Hallux valgus (acquired), left foot: Secondary | ICD-10-CM | POA: Diagnosis not present

## 2018-12-20 DIAGNOSIS — M2011 Hallux valgus (acquired), right foot: Secondary | ICD-10-CM | POA: Diagnosis not present

## 2018-12-20 DIAGNOSIS — L089 Local infection of the skin and subcutaneous tissue, unspecified: Secondary | ICD-10-CM | POA: Diagnosis not present

## 2018-12-20 NOTE — Telephone Encounter (Signed)
Shawn with urgent care call and stated that the patient has a right foot infection. He wanted patient to follow up with you on tomorrow 12/20/2018 and he prescribed patient keflex.

## 2018-12-21 ENCOUNTER — Ambulatory Visit (INDEPENDENT_AMBULATORY_CARE_PROVIDER_SITE_OTHER): Payer: Medicare Other | Admitting: Physician Assistant

## 2018-12-21 ENCOUNTER — Encounter: Payer: Self-pay | Admitting: Physician Assistant

## 2018-12-21 VITALS — BP 110/67 | HR 73 | Temp 98.3°F | Wt 107.7 lb

## 2018-12-21 DIAGNOSIS — K219 Gastro-esophageal reflux disease without esophagitis: Secondary | ICD-10-CM

## 2018-12-21 DIAGNOSIS — F4321 Adjustment disorder with depressed mood: Secondary | ICD-10-CM | POA: Diagnosis not present

## 2018-12-21 DIAGNOSIS — L03115 Cellulitis of right lower limb: Secondary | ICD-10-CM | POA: Diagnosis not present

## 2018-12-21 DIAGNOSIS — I10 Essential (primary) hypertension: Secondary | ICD-10-CM | POA: Diagnosis not present

## 2018-12-21 MED ORDER — SPIRONOLACTONE 25 MG PO TABS: 25.0000 mg | ORAL_TABLET | Freq: Every day | ORAL | 3 refills | Status: DC

## 2018-12-21 MED ORDER — SERTRALINE HCL 50 MG PO TABS: 50.0000 mg | ORAL_TABLET | Freq: Every day | ORAL | 3 refills | Status: DC

## 2018-12-21 MED ORDER — OMEPRAZOLE 40 MG PO CPDR: 40.0000 mg | DELAYED_RELEASE_CAPSULE | Freq: Every day | ORAL | 1 refills | Status: DC

## 2018-12-21 NOTE — Patient Instructions (Signed)

## 2018-12-21 NOTE — Telephone Encounter (Signed)
Noted  

## 2018-12-21 NOTE — Progress Notes (Signed)
Patient: Suzanne Avila Adult    DOB: 07-11-1946   73 y.o.   MRN: [id number] Visit Date: 12/21/2018  Today's Provider: Margaretann Loveless, PA-C   Chief Complaint  Patient presents with  . Foot Pain   Subjective:     HPI   Right Foot Pain Patient here today to for follow-up for right foot infection. Patient was seen at [facility] on yesterday 12/20/2018 for right foot infection. Patient was giving Keflex to treat the infection. She reports that a culture was taken of the wound but no results noted. Patient still has pain, swelling and redness to her right foot. Patient states she is able to bear weight and walk on her right foot. She does report that the redness is much improved compared to yesterday.  Allergies  Allergen Reactions  . Codeine Itching  . Cyclobenzaprine Itching  . Penicillins     itching     Current Outpatient Medications:  .  albuterol (PROVENTIL HFA;VENTOLIN HFA) 108 (90 Base) MCG/ACT inhaler, ALBUTEROL, 90MCG/ACT (Inhalation Aerosol Solution)  2 PUFFS Every Day, As Needed for 0 days  Quantity: 0.00;  Refills: 0   Ordered :09-Jan-2014  Denna Haggard, MA, Anastasiya ;  Started 10-Aug-2009 Active Comments: Medication taken as needed. , Disp: , Rfl:  .  aspirin 325 MG tablet, Take 325 mg by mouth daily., Disp: , Rfl:  .  atorvastatin (LIPITOR) 40 MG tablet, Take 1 tablet (40 mg total) by mouth daily., Disp: 90 tablet, Rfl: 3 .  cholecalciferol (VITAMIN D) 1000 units tablet, Take 1 tablet (1,000 Units total) by mouth daily., Disp: 90 tablet, Rfl: 3 .  diltiazem (CARDIZEM) 120 MG tablet, Take 1 tablet (120 mg total) by mouth daily., Disp: 90 tablet, Rfl: 3 .  dorzolamide (TRUSOPT) 2 % ophthalmic solution, , Disp: , Rfl:  .  Fluticasone-Salmeterol (ADVAIR DISKUS) 250-50 MCG/DOSE AEPB, USE 1 INHALATION TWICE A DAY, Disp: 180 each, Rfl: 3 .  [START ON 01/11/2019] HYDROcodone-acetaminophen (NORCO) 7.5-325 MG tablet, Take 1 tablet by mouth every 6 (six) hours as needed  for severe pain., Disp: 120 tablet, Rfl: 0 .  HYDROcodone-acetaminophen (NORCO) 7.5-325 MG tablet, Take 1 tablet by mouth every 6 (six) hours as needed for severe pain., Disp: 120 tablet, Rfl: 0 .  latanoprost (XALATAN) 0.005 % ophthalmic solution, 1 drop at bedtime., Disp: , Rfl:  .  meloxicam (MOBIC) 15 MG tablet, Take 1 tablet (15 mg total) by mouth daily., Disp: 90 tablet, Rfl: 0 .  omeprazole (PRILOSEC) 40 MG capsule, Take 1 capsule (40 mg total) by mouth daily., Disp: 90 capsule, Rfl: 0 .  potassium chloride (KLOR-CON) 20 MEQ packet, , Disp: , Rfl:  .  predniSONE (DELTASONE) 20 MG tablet, Take 80mg  (4 tabs) x 2 days, 60mg  (3 tabs) x 2 days, 40mg  (2 tabs) x 2 days, then 20mg  (1tab) x 2 days, Disp: 22 tablet, Rfl: 0 .  pregabalin (LYRICA) 150 MG capsule, Take 1 capsule (150 mg total) by mouth every 8 (eight) hours., Disp: 270 capsule, Rfl: 0 .  ramipril (ALTACE) 10 MG capsule, Take 1 capsule (10 mg total) by mouth daily., Disp: 90 capsule, Rfl: 3 .  rOPINIRole (REQUIP XL) 2 MG 24 hr tablet, Take 1 tablet (2 mg total) by mouth at bedtime., Disp: 90 tablet, Rfl: 3 .  sertraline (ZOLOFT) 50 MG tablet, Take 1 tablet (50 mg total) by mouth daily., Disp: 30 tablet, Rfl: 3 .  spironolactone (ALDACTONE) 25 MG tablet, Take 1  tablet (25 mg total) by mouth daily., Disp: 90 tablet, Rfl: 0 .  vitamin B-12 (CYANOCOBALAMIN) 500 MCG tablet, Take 1 tablet (500 mcg total) by mouth daily., Disp: 90 tablet, Rfl: 0 .  HYDROcodone-acetaminophen (NORCO) 7.5-325 MG tablet, Take 1 tablet by mouth every 6 (six) hours as needed for severe pain., Disp: 120 tablet, Rfl: 0  Review of Systems  Constitutional: Negative.   HENT: Negative.   Respiratory: Negative.   Genitourinary: Negative.   Skin: Positive for wound.  Neurological: Negative.     Social History   Tobacco Use  . Smoking status: Current Every Day Smoker    Packs/day: 1.00    Years: 50.00    Pack years: 50.00    Types: Cigarettes  . Smokeless  tobacco: Never Used  Substance Use Topics  . Alcohol use: Yes    Alcohol/week: 42.0 standard drinks    Types: 42 Cans of beer per week      Objective:   BP 110/67 (BP Location: Left Arm, Patient Position: Sitting, Cuff Size: Normal)   Pulse 73   Temp 98.3 F (36.8 C) (Oral)   Wt 107 lb 11.2 oz (48.9 kg)   BMI 21.75 kg/m  Vitals:   12/21/18 1139  BP: 110/67  Pulse: 73  Temp: 98.3 F (36.8 C)  TempSrc: Oral  Weight: 107 lb 11.2 oz (48.9 kg)     Physical Exam Vitals signs reviewed.  Constitutional:      General: He is not in acute distress.    Appearance: He is well-developed.  HENT:     Head: Normocephalic and atraumatic.  Eyes:     Conjunctiva/sclera: Conjunctivae normal.  Neck:     Musculoskeletal: Normal range of motion and neck supple.  Pulmonary:     Effort: Pulmonary effort is normal. No respiratory distress.  Musculoskeletal:       Feet:  Psychiatric:        Mood and Affect: Mood normal.        Behavior: Behavior normal.        Thought Content: Thought content normal.        Judgment: Judgment normal.         Assessment & Plan    1. Cellulitis of right lower extremity Continue Keflex as prescribed. I will see her back in 1 week to recheck. If worsening or still not improving will get xray to r/o osteomyelitis.   2. Gastroesophageal reflux disease without esophagitis Stable. Diagnosis pulled for medication refill. Continue current medical treatment plan. - omeprazole (PRILOSEC) 40 MG capsule; Take 1 capsule (40 mg total) by mouth daily.  Dispense: 90 capsule; Refill: 1  3. Situational depression Stable. Diagnosis pulled for medication refill. Continue current medical treatment plan. - sertraline (ZOLOFT) 50 MG tablet; Take 1 tablet (50 mg total) by mouth daily.  Dispense: 90 tablet; Refill: 3  4. Essential hypertension Stable. Diagnosis pulled for medication refill. Continue current medical treatment plan. - spironolactone (ALDACTONE) 25 MG  tablet; Take 1 tablet (25 mg total) by mouth daily.  Dispense: 90 tablet; Refill: 3     Margaretann Loveless, PA-C  [facility]

## 2018-12-28 ENCOUNTER — Ambulatory Visit: Payer: Self-pay | Admitting: Physician Assistant

## 2019-01-02 ENCOUNTER — Other Ambulatory Visit: Payer: Self-pay | Admitting: Physician Assistant

## 2019-01-02 DIAGNOSIS — I701 Atherosclerosis of renal artery: Secondary | ICD-10-CM

## 2019-01-02 DIAGNOSIS — I10 Essential (primary) hypertension: Secondary | ICD-10-CM

## 2019-01-02 DIAGNOSIS — E538 Deficiency of other specified B group vitamins: Secondary | ICD-10-CM

## 2019-01-02 DIAGNOSIS — H409 Unspecified glaucoma: Secondary | ICD-10-CM

## 2019-01-02 DIAGNOSIS — M797 Fibromyalgia: Secondary | ICD-10-CM

## 2019-01-02 DIAGNOSIS — J449 Chronic obstructive pulmonary disease, unspecified: Secondary | ICD-10-CM

## 2019-01-02 DIAGNOSIS — E7800 Pure hypercholesterolemia, unspecified: Secondary | ICD-10-CM

## 2019-01-02 DIAGNOSIS — M1612 Unilateral primary osteoarthritis, left hip: Secondary | ICD-10-CM

## 2019-01-02 DIAGNOSIS — G894 Chronic pain syndrome: Secondary | ICD-10-CM

## 2019-01-02 DIAGNOSIS — G2581 Restless legs syndrome: Secondary | ICD-10-CM

## 2019-01-02 DIAGNOSIS — K219 Gastro-esophageal reflux disease without esophagitis: Secondary | ICD-10-CM

## 2019-01-02 MED ORDER — DORZOLAMIDE HCL 2 % OP SOLN: 1.0000 [drp] | Freq: Three times a day (TID) | OPHTHALMIC | 3 refills | Status: DC

## 2019-01-02 MED ORDER — LATANOPROST 0.005 % OP SOLN: 1.0000 [drp] | Freq: Every day | OPHTHALMIC | 3 refills | Status: DC

## 2019-01-02 MED ORDER — DILTIAZEM HCL 120 MG PO TABS: 120.0000 mg | ORAL_TABLET | Freq: Every day | ORAL | 3 refills | Status: DC

## 2019-01-02 MED ORDER — VITAMIN B-12 500 MCG PO TABS: 500.0000 ug | ORAL_TABLET | Freq: Every day | ORAL | 3 refills | Status: DC

## 2019-01-02 MED ORDER — RAMIPRIL 10 MG PO CAPS: 10.0000 mg | ORAL_CAPSULE | Freq: Every day | ORAL | 3 refills | Status: DC

## 2019-01-02 MED ORDER — ROPINIROLE HCL ER 2 MG PO TB24: 2.0000 mg | ORAL_TABLET | Freq: Every day | ORAL | 3 refills | Status: DC

## 2019-01-02 MED ORDER — ALBUTEROL SULFATE HFA 108 (90 BASE) MCG/ACT IN AERS: INHALATION_SPRAY | RESPIRATORY_TRACT | 3 refills | Status: DC

## 2019-01-02 MED ORDER — POTASSIUM CHLORIDE 20 MEQ PO PACK: 20.0000 meq | PACK | Freq: Every day | ORAL | 3 refills | Status: DC

## 2019-01-02 MED ORDER — ATORVASTATIN CALCIUM 40 MG PO TABS: 40.0000 mg | ORAL_TABLET | Freq: Every day | ORAL | 3 refills | Status: DC

## 2019-01-02 MED ORDER — MELOXICAM 15 MG PO TABS: 15.0000 mg | ORAL_TABLET | Freq: Every day | ORAL | 3 refills | Status: DC

## 2019-01-02 MED ORDER — FLUTICASONE-SALMETEROL 250-50 MCG/DOSE IN AEPB: INHALATION_SPRAY | RESPIRATORY_TRACT | 3 refills | Status: DC

## 2019-01-02 MED ORDER — ASPIRIN 325 MG PO TABS: 325.0000 mg | ORAL_TABLET | Freq: Every day | ORAL | 3 refills | Status: DC

## 2019-01-02 MED ORDER — OMEPRAZOLE 40 MG PO CPDR: 40.0000 mg | DELAYED_RELEASE_CAPSULE | Freq: Every day | ORAL | 3 refills | Status: DC

## 2019-01-02 NOTE — Telephone Encounter (Signed)
albuterol (PROVENTIL HFA;VENTOLIN HFA) 108 (90 Base) MCG/ACT inhaler    aspirin 325 MG tablet    atorvastatin (LIPITOR) 40 MG tablet    cholecalciferol (VITAMIN D) 1000 units tablet    diltiazem (CARDIZEM) 120 MG tablet    dorzolamide (TRUSOPT) 2 % ophthalmic solution    Fluticasone-Salmeterol (ADVAIR DISKUS) 250-50 MCG/DOSE AEPB    HYDROcodone-acetaminophen (NORCO) 7.5-325 MG tablet(Expired)    HYDROcodone-acetaminophen (NORCO) 7.5-325 MG tablet    HYDROcodone-acetaminophen (NORCO) 7.5-325 MG tablet    latanoprost (XALATAN) 0.005 % ophthalmic solution    meloxicam (MOBIC) 15 MG tablet    omeprazole (PRILOSEC) 40 MG capsule    potassium chloride (KLOR-CON) 20 MEQ packet    pregabalin (LYRICA) 150 MG capsule    ramipril (ALTACE) 10 MG capsule    rOPINIRole (REQUIP XL) 2 MG 24 hr tablet            vitamin B-12 (CYANOCOBALAMIN) 500 MCG   Patient called stating she needed all her medications sent into [facility].  She said she has been waiting 2 weeks for these and they havent arrived.   I told her we didn't have a request to refill these.   Please sent into [facility]

## 2019-01-02 NOTE — Telephone Encounter (Signed)
Refilled all meds except pain meds and lyrica as these are filled by Dr. Brooke Dare with pain management.

## 2019-01-03 ENCOUNTER — Ambulatory Visit (INDEPENDENT_AMBULATORY_CARE_PROVIDER_SITE_OTHER): Payer: Medicare Other

## 2019-01-03 DIAGNOSIS — Z0000 Encounter for general adult medical examination without abnormal findings: Secondary | ICD-10-CM | POA: Diagnosis not present

## 2019-01-03 NOTE — Progress Notes (Addendum)
Subjective:   Suzanne Avila is a 74 y.o. female who presents for Medicare Annual/Subsequent preventive examination.  Review of Systems:  N/A  Cardiac Risk Factors include: advanced age (>68men, >56 women);smoking/ tobacco exposure;sedentary lifestyle;dyslipidemia;hypertension     Objective:    Vitals: BP 138/70 (BP Location: Right Arm)   Pulse 90   Temp 98 F (36.7 C) (Oral)   Ht 4\' 11"  (1.499 m)   Wt 102 lb (46.3 kg)   BMI 20.60 kg/m   Body mass index is 20.6 kg/m.  Advanced Directives 01/03/2019 08/02/2018 05/02/2018 01/31/2018 01/01/2018 11/01/2017 08/01/2017  Does Patient Have a Medical Advance Directive? No No No No No No No  Would patient like information on creating a medical advance directive? No - Patient declined Yes (MAU/Ambulatory/Procedural Areas - Information given) No - Patient declined No - Patient declined No - Patient declined No - Patient declined -    Tobacco Social History   Tobacco Use  Smoking Status Current Every Day Smoker  . Packs/day: 1.00  . Years: 50.00  . Pack years: 50.00  . Types: Cigarettes  Smokeless Tobacco Never Used     Ready to quit: No Counseling given: No   Clinical Intake:  Pre-visit preparation completed: Yes  Pain : 0-10 Pain Score: 8  Pain Type: Chronic pain Pain Location: Back(and legs) Pain Descriptors / Indicators: Aching Pain Frequency: Constant     Nutritional Status: BMI of 19-24  Normal Diabetes: No  How often do you need to have someone help you when you read instructions, pamphlets, or other written materials from your doctor or pharmacy?: 1 - Never  Interpreter Needed?: No  Information entered by :: [facility], LPN  Past Medical History:  Diagnosis Date  . Glaucoma   . Hyperlipidemia   . Hypertension    Past Surgical History:  Procedure Laterality Date  . BACK SURGERY    . CARPAL TUNNEL RELEASE Left   . EYE SURGERY     Family History  Problem Relation Age of Onset  . Cancer Mother   .  Cancer Father    Social History   Socioeconomic History  . Marital status: Married    Spouse name: Not on file  . Number of children: 2  . Years of education: Not on file  . Highest education level: 12th grade  Occupational History  . Occupation: disabled  . Occupation: retired  Engineer, production  . Financial resource strain: Not hard at all  . Food insecurity:    Worry: Never true    Inability: Never true  . Transportation needs:    Medical: No    Non-medical: No  Tobacco Use  . Smoking status: Current Every Day Smoker    Packs/day: 1.00    Years: 50.00    Pack years: 50.00    Types: Cigarettes  . Smokeless tobacco: Never Used  Substance and Sexual Activity  . Alcohol use: Yes    Alcohol/week: 35.0 standard drinks    Types: 35 Cans of beer per week  . Drug use: No  . Sexual activity: Not on file  Lifestyle  . Physical activity:    Days per week: 0 days    Minutes per session: 0 min  . Stress: Not on file  Relationships  . Social connections:    Talks on phone: Patient refused    Gets together: Patient refused    Attends religious service: Patient refused    Active member of club or organization: Patient refused  Attends meetings of clubs or organizations: Patient refused    Relationship status: Patient refused  Other Topics Concern  . Not on file  Social History Narrative  . Not on file    Outpatient Encounter Medications as of 01/03/2019  Medication Sig  . albuterol (PROVENTIL HFA;VENTOLIN HFA) 108 (90 Base) MCG/ACT inhaler ALBUTEROL, 90MCG/ACT (Inhalation Aerosol Solution)  2 PUFFS Every Day prn  . aspirin 325 MG tablet Take 1 tablet (325 mg total) by mouth daily.  Marland Kitchen atorvastatin (LIPITOR) 40 MG tablet Take 1 tablet (40 mg total) by mouth daily.  . cholecalciferol (VITAMIN D) 1000 units tablet Take 1 tablet (1,000 Units total) by mouth daily.  Marland Kitchen diltiazem (CARDIZEM) 120 MG tablet Take 1 tablet (120 mg total) by mouth daily.  . dorzolamide (TRUSOPT) 2 %  ophthalmic solution Place 1 drop into both eyes 3 (three) times daily.  . Fluticasone-Salmeterol (ADVAIR DISKUS) 250-50 MCG/DOSE AEPB USE 1 INHALATION TWICE A DAY  . [START ON 01/11/2019] HYDROcodone-acetaminophen (NORCO) 7.5-325 MG tablet Take 1 tablet by mouth every 6 (six) hours as needed for severe pain.  Marland Kitchen HYDROcodone-acetaminophen (NORCO) 7.5-325 MG tablet Take 1 tablet by mouth every 6 (six) hours as needed for severe pain.  Marland Kitchen latanoprost (XALATAN) 0.005 % ophthalmic solution Place 1 drop into both eyes at bedtime.  . meloxicam (MOBIC) 15 MG tablet Take 1 tablet (15 mg total) by mouth daily.  Marland Kitchen omeprazole (PRILOSEC) 40 MG capsule Take 1 capsule (40 mg total) by mouth daily.  . potassium chloride (KLOR-CON) 20 MEQ packet Take 20 mEq by mouth daily.  . pregabalin (LYRICA) 150 MG capsule Take 1 capsule (150 mg total) by mouth every 8 (eight) hours.  . ramipril (ALTACE) 10 MG capsule Take 1 capsule (10 mg total) by mouth daily.  Marland Kitchen rOPINIRole (REQUIP XL) 2 MG 24 hr tablet Take 1 tablet (2 mg total) by mouth at bedtime.  . sertraline (ZOLOFT) 50 MG tablet Take 1 tablet (50 mg total) by mouth daily.  Marland Kitchen spironolactone (ALDACTONE) 25 MG tablet Take 1 tablet (25 mg total) by mouth daily.  . vitamin B-12 (CYANOCOBALAMIN) 500 MCG tablet Take 1 tablet (500 mcg total) by mouth daily.  Marland Kitchen HYDROcodone-acetaminophen (NORCO) 7.5-325 MG tablet Take 1 tablet by mouth every 6 (six) hours as needed for severe pain.   No facility-administered encounter medications on file as of 01/03/2019.     Activities of Daily Living In your present state of health, do you have any difficulty performing the following activities: 01/03/2019  Hearing? N  Vision? Y  Comment Needs new eye glass prescription. Pt plans to set up eye exam this year.   Difficulty concentrating or making decisions? N  Walking or climbing stairs? Y  Comment Due to back and leg pains.  Dressing or bathing? N  Doing errands, shopping? N  Preparing  Food and eating ? N  Using the Toilet? N  In the past six months, have you accidently leaked urine? N  Do you have problems with loss of bowel control? N  Managing your Medications? N  Managing your Finances? N  Housekeeping or managing your Housekeeping? N  Some recent data might be hidden    Patient Care Team: Margaretann Loveless, PA-C as PCP - General (Family Medicine) Dew, Marlow Baars, MD as Referring Physician (Vascular Surgery) Delano Metz, MD as Referring Physician (Pain Medicine) Lockie Mola, MD as Referring Physician (Ophthalmology) Barbette Merino, NP as Nurse Practitioner (Nurse Practitioner) Donato Heinz, MD (Orthopedic Surgery)  Assessment:   This is a routine wellness examination for Jeanna.  Exercise Activities and Dietary recommendations Current Exercise Habits: The patient does not participate in regular exercise at present, Exercise limited by: orthopedic condition(s)  Goals    . Prevent falls     Recommend to remove any items from the home that may cause slips or trips.    . Quit Smoking     Recommend to continue efforts to reduce smoking habits until no longer smoking (Smoking Cessation literature attached to AVS).      . Reduce alcohol intake     Recommend cutting out all alcohol. Pt declined, recommend cutting consumption in half.        Fall Risk Fall Risk  01/03/2019 10/31/2018 08/02/2018 05/02/2018 01/31/2018  Falls in the past year? 1 1 Yes No No  Number falls in past yr: 0 - 1 - -  Injury with Fall? 0 - No - -  Comment - - - - -  Follow up Falls prevention discussed - - - -   FALL RISK PREVENTION PERTAINING TO THE HOME: Any stairs in or around the home? Yes  If so, do they handrails? Yes   Home free of loose throw rugs in walkways, pet beds, electrical cords, etc? Yes  Adequate lighting in your home to reduce risk of falls? Yes   ASSISTIVE DEVICES UTILIZED TO PREVENT FALLS:  Life alert? No  Use of a cane, walker or w/c? Yes   Grab bars in the bathroom? Yes  Shower chair or bench in shower? Yes  Elevated toilet seat or a handicapped toilet? No    TIMED UP AND GO:  Was the test performed? No .    Depression Screen PHQ 2/9 Scores 01/03/2019 08/02/2018 05/02/2018 01/31/2018  PHQ - 2 Score 2 0 0 0  PHQ- 9 Score 12 - - -    Cognitive Function     6CIT Screen 01/03/2019 01/01/2018  What Year? 0 points 0 points  What month? 0 points 0 points  What time? 0 points 0 points  Count back from 20 0 points 0 points  Months in reverse 2 points 2 points  Repeat phrase 4 points 2 points  Total Score 6 4    Immunization History  Administered Date(s) Administered  . Influenza Split 07/14/2010, 08/16/2011  . Influenza, High Dose Seasonal PF 09/02/2017, 09/27/2018  . Influenza,inj,Quad PF,6+ Mos 07/09/2014  . Pneumococcal Conjugate-13 01/09/2014  . Pneumococcal Polysaccharide-23 12/01/2009, 07/24/2016  . Zoster 05/23/2014  . Zoster Recombinat (Shingrix) 09/02/2017    Qualifies for Shingles Vaccine? Yes , awaiting second dose.  Tdap: Although this vaccine is not a covered service during a Wellness Exam, does the patient still wish to receive this vaccine today?  No .  Education has been provided regarding the importance of this vaccine. Advised may receive this vaccine at local pharmacy or Health Dept. Aware to provide a copy of the vaccination record if obtained from local pharmacy or Health Dept. Verbalized acceptance and understanding.  Flu Vaccine: Up to date  Pneumococcal Vaccine: Up to date   Screening Tests Health Maintenance  Topic Date Due  . TETANUS/TDAP  02/21/1964  . COLONOSCOPY  01/30/2016  . MAMMOGRAM  10/20/2019  . DEXA SCAN  10/19/2020  . INFLUENZA VACCINE  Completed  . Hepatitis C Screening  Completed  . PNA vac Low Risk Adult  Completed   Cancer Screenings:  Colorectal Screening: Completed 04/24/11. Repeat every 5 years. Pt declined referral today.  Mammogram:  Completed 10/19/18.    Bone Density: Completed 10/19/18. Results reflect OSTEOPOROSIS. Repeat every 2 years.   Lung Cancer Screening: (Low Dose CT Chest recommended if Age 55-80 years, 30 pack-year currently smoking OR have quit w/in 15years.) does qualify, however not due for retesting until 06/2019.    Additional Screening:  Hepatitis C Screening: Up to date  Vision Screening: Recommended annual ophthalmology exams for early detection of glaucoma and other disorders of the eye.  Dental Screening: Recommended annual dental exams for proper oral hygiene  Community Resource Referral:  CRR required this visit?  No       Plan:  I have personally reviewed and addressed the Medicare Annual Wellness questionnaire and have noted the following in the patient's chart:  A. Medical and social history B. Use of alcohol, tobacco or illicit drugs  C. Current medications and supplements D. Functional ability and status E.  Nutritional status F.  Physical activity G. Advance directives H. List of other physicians I.  Hospitalizations, surgeries, and ER visits in previous 12 months J.  Vitals K. Screenings such as hearing and vision if needed, cognitive and depression L. Referrals and appointments - none  In addition, I have reviewed and discussed with patient certain preventive protocols, quality metrics, and best practice recommendations. A written personalized care plan for preventive services as well as general preventive health recommendations were provided to patient.  See attached scanned questionnaire for additional information.   Signed,  Hyacinth Meeker, LPN Nurse Health Advisor   Nurse Recommendations: Pt declined the colonoscopy referral and the tetanus vaccine today.

## 2019-01-03 NOTE — Patient Instructions (Addendum)
Suzanne Avila , Thank you for taking time to come for your Medicare Wellness Visit. I appreciate your ongoing commitment to your health goals. Please review the following plan we discussed and let me know if I can assist you in the future.   Screening recommendations/referrals: Colonoscopy: Pt declines order today.  Mammogram: Up to date, due 09/2020 Bone Density: Up to date, due 09/2020 Recommended yearly ophthalmology/optometry visit for glaucoma screening and checkup Recommended yearly dental visit for hygiene and checkup  Vaccinations: Influenza vaccine: Up to date Pneumococcal vaccine: Completed series Tdap vaccine: Pt declines today.  Shingles vaccine: Awaiting second dose of Shingrix.    Advanced directives: Advance directive discussed with you today. Even though you declined this today please call our office should you change your mind and we can give you the proper paperwork for you to fill out.  Conditions/risks identified: Fall risk prevention, Decreasing alcohol consumption and Smoking cessation discussed with you today.  Next appointment: 01/09/19 @ 10:00 AM with Joycelyn Man.   Preventive Care 74 Years and Older, Female Preventive care refers to lifestyle choices and visits with your health care provider that can promote health and wellness. What does preventive care include?  A yearly physical exam. This is also called an annual well check.  Dental exams once or twice a year.  Routine eye exams. Ask your health care provider how often you should have your eyes checked.  Personal lifestyle choices, including:  Daily care of your teeth and gums.  Regular physical activity.  Eating a healthy diet.  Avoiding tobacco and drug use.  Limiting alcohol use.  Practicing safe sex.  Taking low-dose aspirin every day.  Taking vitamin and mineral supplements as recommended by your health care provider. What happens during an annual well check? The services and  screenings done by your health care provider during your annual well check will depend on your age, overall health, lifestyle risk factors, and family history of disease. Counseling  Your health care provider may ask you questions about your:  Alcohol use.  Tobacco use.  Drug use.  Emotional well-being.  Home and relationship well-being.  Sexual activity.  Eating habits.  History of falls.  Memory and ability to understand (cognition).  Work and work Astronomer.  Reproductive health. Screening  You may have the following tests or measurements:  Height, weight, and BMI.  Blood pressure.  Lipid and cholesterol levels. These may be checked every 5 years, or more frequently if you are over 66 years old.  Skin check.  Lung cancer screening. You may have this screening every year starting at age 74 if you have a 30-pack-year history of smoking and currently smoke or have quit within the past 15 years.  Fecal occult blood test (FOBT) of the stool. You may have this test every year starting at age 32.  Flexible sigmoidoscopy or colonoscopy. You may have a sigmoidoscopy every 5 years or a colonoscopy every 10 years starting at age 74.  Hepatitis C blood test.  Hepatitis B blood test.  Sexually transmitted disease (STD) testing.  Diabetes screening. This is done by checking your blood sugar (glucose) after you have not eaten for a while (fasting). You may have this done every 1-3 years.  Bone density scan. This is done to screen for osteoporosis. You may have this done starting at age 74.  Mammogram. This may be done every 1-2 years. Talk to your health care provider about how often you should have regular mammograms. Talk with  your health care provider about your test results, treatment options, and if necessary, the need for more tests. Vaccines  Your health care provider may recommend certain vaccines, such as:  Influenza vaccine. This is recommended every year.   Tetanus, diphtheria, and acellular pertussis (Tdap, Td) vaccine. You may need a Td booster every 10 years.  Zoster vaccine. You may need this after age 74.  Pneumococcal 13-valent conjugate (PCV13) vaccine. One dose is recommended after age 74.  Pneumococcal polysaccharide (PPSV23) vaccine. One dose is recommended after age 76. Talk to your health care provider about which screenings and vaccines you need and how often you need them. This information is not intended to replace advice given to you by your health care provider. Make sure you discuss any questions you have with your health care provider. Document Released: 11/06/2015 Document Revised: 06/29/2016 Document Reviewed: 08/11/2015 Elsevier Interactive Patient Education  2017 [place] Prevention in the Home Falls can cause injuries. They can happen to people of all ages. There are many things you can do to make your home safe and to help prevent falls. What can I do on the outside of my home?  Regularly fix the edges of walkways and driveways and fix any cracks.  Remove anything that might make you trip as you walk through a door, such as a raised step or threshold.  Trim any bushes or trees on the path to your home.  Use bright outdoor lighting.  Clear any walking paths of anything that might make someone trip, such as rocks or tools.  Regularly check to see if handrails are loose or broken. Make sure that both sides of any steps have handrails.  Any raised decks and porches should have guardrails on the edges.  Have any leaves, snow, or ice cleared regularly.  Use sand or salt on walking paths during winter.  Clean up any spills in your garage right away. This includes oil or grease spills. What can I do in the bathroom?  Use night lights.  Install grab bars by the toilet and in the tub and shower. Do not use towel bars as grab bars.  Use non-skid mats or decals in the tub or shower.  If you need to sit  down in the shower, use a plastic, non-slip stool.  Keep the floor dry. Clean up any water that spills on the floor as soon as it happens.  Remove soap buildup in the tub or shower regularly.  Attach bath mats securely with double-sided non-slip rug tape.  Do not have throw rugs and other things on the floor that can make you trip. What can I do in the bedroom?  Use night lights.  Make sure that you have a light by your bed that is easy to reach.  Do not use any sheets or blankets that are too big for your bed. They should not hang down onto the floor.  Have a firm chair that has side arms. You can use this for support while you get dressed.  Do not have throw rugs and other things on the floor that can make you trip. What can I do in the kitchen?  Clean up any spills right away.  Avoid walking on wet floors.  Keep items that you use a lot in easy-to-reach places.  If you need to reach something above you, use a strong step stool that has a grab bar.  Keep electrical cords out of the way.  Do not  use floor polish or wax that makes floors slippery. If you must use wax, use non-skid floor wax.  Do not have throw rugs and other things on the floor that can make you trip. What can I do with my stairs?  Do not leave any items on the stairs.  Make sure that there are handrails on both sides of the stairs and use them. Fix handrails that are broken or loose. Make sure that handrails are as long as the stairways.  Check any carpeting to make sure that it is firmly attached to the stairs. Fix any carpet that is loose or worn.  Avoid having throw rugs at the top or bottom of the stairs. If you do have throw rugs, attach them to the floor with carpet tape.  Make sure that you have a light switch at the top of the stairs and the bottom of the stairs. If you do not have them, ask someone to add them for you. What else can I do to help prevent falls?  Wear shoes that:  Do not have  high heels.  Have rubber bottoms.  Are comfortable and fit you well.  Are closed at the toe. Do not wear sandals.  If you use a stepladder:  Make sure that it is fully opened. Do not climb a closed stepladder.  Make sure that both sides of the stepladder are locked into place.  Ask someone to hold it for you, if possible.  Clearly mark and make sure that you can see:  Any grab bars or handrails.  First and last steps.  Where the edge of each step is.  Use tools that help you move around (mobility aids) if they are needed. These include:  Canes.  Walkers.  Scooters.  Crutches.  Turn on the lights when you go into a dark area. Replace any light bulbs as soon as they burn out.  Set up your furniture so you have a clear path. Avoid moving your furniture around.  If any of your floors are uneven, fix them.  If there are any pets around you, be aware of where they are.  Review your medicines with your doctor. Some medicines can make you feel dizzy. This can increase your chance of falling. Ask your doctor what other things that you can do to help prevent falls. This information is not intended to replace advice given to you by your health care provider. Make sure you discuss any questions you have with your health care provider. Document Released: 08/06/2009 Document Revised: 03/17/2016 Document Reviewed: 11/14/2014 Elsevier Interactive Patient Education  2017 [employer].

## 2019-01-07 ENCOUNTER — Other Ambulatory Visit: Payer: Self-pay | Admitting: Physician Assistant

## 2019-01-07 DIAGNOSIS — G2581 Restless legs syndrome: Secondary | ICD-10-CM

## 2019-01-07 MED ORDER — ROPINIROLE HCL 2 MG PO TABS: 2.0000 mg | ORAL_TABLET | Freq: Every day | ORAL | 1 refills | Status: DC

## 2019-01-07 NOTE — Progress Notes (Signed)
Changed requip XR 2mg   to IR 2mg  due to back order notice from [facility].

## 2019-01-09 ENCOUNTER — Encounter: Payer: Self-pay | Admitting: Physician Assistant

## 2019-01-09 ENCOUNTER — Ambulatory Visit (INDEPENDENT_AMBULATORY_CARE_PROVIDER_SITE_OTHER): Payer: Medicare Other | Admitting: Physician Assistant

## 2019-01-09 ENCOUNTER — Other Ambulatory Visit: Payer: Self-pay

## 2019-01-09 VITALS — BP 178/80 | HR 83 | Temp 98.2°F | Resp 16 | Wt 96.6 lb

## 2019-01-09 DIAGNOSIS — I10 Essential (primary) hypertension: Secondary | ICD-10-CM | POA: Diagnosis not present

## 2019-01-09 DIAGNOSIS — E7800 Pure hypercholesterolemia, unspecified: Secondary | ICD-10-CM

## 2019-01-09 DIAGNOSIS — F1020 Alcohol dependence, uncomplicated: Secondary | ICD-10-CM

## 2019-01-09 DIAGNOSIS — E876 Hypokalemia: Secondary | ICD-10-CM

## 2019-01-09 DIAGNOSIS — R634 Abnormal weight loss: Secondary | ICD-10-CM | POA: Diagnosis not present

## 2019-01-09 DIAGNOSIS — G2581 Restless legs syndrome: Secondary | ICD-10-CM

## 2019-01-09 DIAGNOSIS — I471 Supraventricular tachycardia: Secondary | ICD-10-CM

## 2019-01-09 DIAGNOSIS — I701 Atherosclerosis of renal artery: Secondary | ICD-10-CM

## 2019-01-09 DIAGNOSIS — J449 Chronic obstructive pulmonary disease, unspecified: Secondary | ICD-10-CM | POA: Diagnosis not present

## 2019-01-09 MED ORDER — PREGABALIN 150 MG PO CAPS: 150.0000 mg | ORAL_CAPSULE | Freq: Two times a day (BID) | ORAL | 0 refills | Status: DC

## 2019-01-09 NOTE — Patient Instructions (Signed)
Health Maintenance After Age 74 After age 74, you are at a higher risk for certain long-term diseases and infections as well as injuries from falls. Falls are a major cause of broken bones and head injuries in people who are older than age 74. Getting regular preventive care can help to keep you healthy and well. Preventive care includes getting regular testing and making lifestyle changes as recommended by your health care provider. Talk with your health care provider about:  Which screenings and tests you should have. A screening is a test that checks for a disease when you have no symptoms.  A diet and exercise plan that is right for you. What should I know about screenings and tests to prevent falls? Screening and testing are the best ways to find a health problem early. Early diagnosis and treatment give you the best chance of managing medical conditions that are common after age 74. Certain conditions and lifestyle choices may make you more likely to have a fall. Your health care provider may recommend:  Regular vision checks. Poor vision and conditions such as cataracts can make you more likely to have a fall. If you wear glasses, make sure to get your prescription updated if your vision changes.  Medicine review. Work with your health care provider to regularly review all of the medicines you are taking, including over-the-counter medicines. Ask your health care provider about any side effects that may make you more likely to have a fall. Tell your health care provider if any medicines that you take make you feel dizzy or sleepy.  Osteoporosis screening. Osteoporosis is a condition that causes the bones to get weaker. This can make the bones weak and cause them to break more easily.  Blood pressure screening. Blood pressure changes and medicines to control blood pressure can make you feel dizzy.  Strength and balance checks. Your health care provider may recommend certain tests to check your  strength and balance while standing, walking, or changing positions.  Foot health exam. Foot pain and numbness, as well as not wearing proper footwear, can make you more likely to have a fall.  Depression screening. You may be more likely to have a fall if you have a fear of falling, feel emotionally low, or feel unable to do activities that you used to do.  Alcohol use screening. Using too much alcohol can affect your balance and may make you more likely to have a fall. What actions can I take to lower my risk of falls? General instructions  Talk with your health care provider about your risks for falling. Tell your health care provider if: ? You fall. Be sure to tell your health care provider about all falls, even ones that seem minor. ? You feel dizzy, sleepy, or off-balance.  Take over-the-counter and prescription medicines only as told by your health care provider. These include any supplements.  Eat a healthy diet and maintain a healthy weight. A healthy diet includes low-fat dairy products, low-fat (lean) meats, and fiber from whole grains, beans, and lots of fruits and vegetables. Home safety  Remove any tripping hazards, such as rugs, cords, and clutter.  Install safety equipment such as grab bars in bathrooms and safety rails on stairs.  Keep rooms and walkways well-lit. Activity   Follow a regular exercise program to stay fit. This will help you maintain your balance. Ask your health care provider what types of exercise are appropriate for you.  If you need a cane or   walker, use it as recommended by your health care provider.  Wear supportive shoes that have nonskid soles. Lifestyle  Do not drink alcohol if your health care provider tells you not to drink.  If you drink alcohol, limit how much you have: ? 0-1 drink a day for women. ? 0-2 drinks a day for men.  Be aware of how much alcohol is in your drink. In the [place] one drink equals one typical bottle of beer (12  oz), one-half glass of wine (5 oz), or one shot of hard liquor (1 oz).  Do not use any products that contain nicotine or tobacco, such as cigarettes and e-cigarettes. If you need help quitting, ask your health care provider. Summary  Having a healthy lifestyle and getting preventive care can help to protect your health and wellness after age 74.  Screening and testing are the best way to find a health problem early and help you avoid having a fall. Early diagnosis and treatment give you the best chance for managing medical conditions that are more common for people who are older than age 74.  Falls are a major cause of broken bones and head injuries in people who are older than age 74. Take precautions to prevent a fall at home.  Work with your health care provider to learn what changes you can make to improve your health and wellness and to prevent falls. This information is not intended to replace advice given to you by your health care provider. Make sure you discuss any questions you have with your health care provider. Document Released: 08/23/2017 Document Revised: 08/23/2017 Document Reviewed: 08/23/2017 Elsevier Interactive Patient Education  2019 [employer]  

## 2019-01-09 NOTE — Progress Notes (Signed)
Patient: Suzanne Avila Adult    DOB: 1946/03/14   73 y.o.   MRN: [id number] Visit Date: 01/09/2019  Today's Provider: Margaretann Loveless, PA-C   Chief Complaint  Patient presents with  . Follow-up  . Hypertension  . Depression   Subjective:     HPI  Hypertension, follow-up:  BP Readings from Last 3 Encounters:  01/09/19 (!) 178/80  01/03/19 138/70  12/21/18 110/67    He was last seen for hypertension 3 months ago.  BP at that visit was 124/74. Management changes since that visit include none. He reports fair compliance with treatment. He is not having side effects.  He is not exercising. He is not adherent to low salt diet.   Outside blood pressures are not being checked. He is experiencing none.  Patient denies chest pain, chest pressure/discomfort, claudication, dyspnea, exertional chest pressure/discomfort, fatigue, irregular heart beat, lower extremity edema, near-syncope, orthopnea, palpitations, paroxysmal nocturnal dyspnea, syncope and tachypnea.   Cardiovascular risk factors include advanced age (older than 83 for men, 73 for women), hypertension and smoking/ tobacco exposure.  Use of agents associated with hypertension: NSAIDS.     Weight trend: fluctuating a bit Wt Readings from Last 3 Encounters:  01/09/19 96 lb 9.6 oz (43.8 kg)  01/03/19 102 lb (46.3 kg)  12/21/18 107 lb 11.2 oz (48.9 kg)    Current diet: in general, an "unhealthy" diet  ------------------------------------------------------------------------  Depression, Follow-up  He  was last seen for this 3 months ago. Changes made at last visit include started on Zoloft 50mg  qd.   He reports fair compliance with treatment, patient reports that she just started Zoloft 5 days ago. He is not having side effects.   He reports good tolerance of treatment. Current symptoms include: fatigue and insomnia He feels he is Unchanged since last visit.  ------------------------------------------------------------------------   Allergies  Allergen Reactions  . Codeine Itching  . Cyclobenzaprine Itching  . Penicillins     itching     Current Outpatient Medications:  .  albuterol (PROVENTIL HFA;VENTOLIN HFA) 108 (90 Base) MCG/ACT inhaler, ALBUTEROL, 90MCG/ACT (Inhalation Aerosol Solution)  2 PUFFS Every Day prn, Disp: 3 Inhaler, Rfl: 3 .  aspirin 325 MG tablet, Take 1 tablet (325 mg total) by mouth daily., Disp: 90 tablet, Rfl: 3 .  atorvastatin (LIPITOR) 40 MG tablet, Take 1 tablet (40 mg total) by mouth daily., Disp: 90 tablet, Rfl: 3 .  cholecalciferol (VITAMIN D) 1000 units tablet, Take 1 tablet (1,000 Units total) by mouth daily., Disp: 90 tablet, Rfl: 3 .  diltiazem (CARDIZEM) 120 MG tablet, Take 1 tablet (120 mg total) by mouth daily., Disp: 90 tablet, Rfl: 3 .  dorzolamide (TRUSOPT) 2 % ophthalmic solution, Place 1 drop into both eyes 3 (three) times daily., Disp: 10 mL, Rfl: 3 .  Fluticasone-Salmeterol (ADVAIR DISKUS) 250-50 MCG/DOSE AEPB, USE 1 INHALATION TWICE A DAY, Disp: 180 each, Rfl: 3 .  [START ON 01/11/2019] HYDROcodone-acetaminophen (NORCO) 7.5-325 MG tablet, Take 1 tablet by mouth every 6 (six) hours as needed for severe pain., Disp: 120 tablet, Rfl: 0 .  HYDROcodone-acetaminophen (NORCO) 7.5-325 MG tablet, Take 1 tablet by mouth every 6 (six) hours as needed for severe pain., Disp: 120 tablet, Rfl: 0 .  latanoprost (XALATAN) 0.005 % ophthalmic solution, Place 1 drop into both eyes at bedtime., Disp: 2.5 mL, Rfl: 3 .  meloxicam (MOBIC) 15 MG tablet, Take 1 tablet (15 mg total) by mouth daily., Disp: 90 tablet, Rfl:  3 .  omeprazole (PRILOSEC) 40 MG capsule, Take 1 capsule (40 mg total) by mouth daily., Disp: 90 capsule, Rfl: 3 .  potassium chloride (KLOR-CON) 20 MEQ packet, Take 20 mEq by mouth daily., Disp: 90 tablet, Rfl: 3 .  pregabalin (LYRICA) 150 MG capsule, Take 1 capsule (150 mg total) by mouth every 8 (eight)  hours., Disp: 270 capsule, Rfl: 0 .  ramipril (ALTACE) 10 MG capsule, Take 1 capsule (10 mg total) by mouth daily., Disp: 90 capsule, Rfl: 3 .  rOPINIRole (REQUIP) 2 MG tablet, Take 1 tablet (2 mg total) by mouth at bedtime., Disp: 90 tablet, Rfl: 1 .  sertraline (ZOLOFT) 50 MG tablet, Take 1 tablet (50 mg total) by mouth daily., Disp: 90 tablet, Rfl: 3 .  spironolactone (ALDACTONE) 25 MG tablet, Take 1 tablet (25 mg total) by mouth daily., Disp: 90 tablet, Rfl: 3 .  vitamin B-12 (CYANOCOBALAMIN) 500 MCG tablet, Take 1 tablet (500 mcg total) by mouth daily., Disp: 90 tablet, Rfl: 3 .  HYDROcodone-acetaminophen (NORCO) 7.5-325 MG tablet, Take 1 tablet by mouth every 6 (six) hours as needed for severe pain., Disp: 120 tablet, Rfl: 0  Review of Systems  Constitutional: Positive for chills, diaphoresis and fatigue.  HENT: Negative.   Eyes: Positive for itching.  Cardiovascular: Positive for leg swelling.  Endocrine: Negative.   Genitourinary: Negative.   Musculoskeletal: Positive for arthralgias and back pain.  Skin: Negative.   Allergic/Immunologic: Negative.   Neurological: Negative.   Hematological: Negative.   Psychiatric/Behavioral: Negative.     Social History   Tobacco Use  . Smoking status: Current Every Day Smoker    Packs/day: 1.00    Years: 50.00    Pack years: 50.00    Types: Cigarettes  . Smokeless tobacco: Never Used  Substance Use Topics  . Alcohol use: Yes    Alcohol/week: 35.0 standard drinks    Types: 35 Cans of beer per week      Objective:   BP (!) 178/80   Pulse 83   Temp 98.2 F (36.8 C) (Oral)   Resp 16   Wt 96 lb 9.6 oz (43.8 kg)   BMI 19.51 kg/m  Vitals:   01/09/19 1025  BP: (!) 178/80  Pulse: 83  Resp: 16  Temp: 98.2 F (36.8 C)  TempSrc: Oral  Weight: 96 lb 9.6 oz (43.8 kg)     Physical Exam Vitals signs reviewed.  Constitutional:      General: She is not in acute distress.    Appearance: Normal appearance. She is well-developed.  She is not ill-appearing or diaphoretic.  HENT:     Head: Normocephalic and atraumatic.     Right Ear: Tympanic membrane, ear canal and external ear normal.     Left Ear: Tympanic membrane, ear canal and external ear normal.     Nose: Nose normal. No congestion.     Mouth/Throat:     Mouth: Mucous membranes are moist.     Pharynx: No oropharyngeal exudate or posterior oropharyngeal erythema.  Eyes:     General: No scleral icterus.       Right eye: No discharge.        Left eye: No discharge.     Conjunctiva/sclera: Conjunctivae normal.     Pupils: Pupils are equal, round, and reactive to light.  Neck:     Musculoskeletal: Normal range of motion and neck supple.     Thyroid: No thyromegaly.     Vascular: No carotid bruit or JVD.  Trachea: No tracheal deviation.  Cardiovascular:     Rate and Rhythm: Normal rate and regular rhythm.     Heart sounds: Normal heart sounds. No murmur. No friction rub. No gallop.   Pulmonary:     Effort: Pulmonary effort is normal. No respiratory distress.     Breath sounds: Normal breath sounds. No wheezing or rales.  Chest:     Chest wall: No tenderness.  Abdominal:     General: Bowel sounds are normal. There is no distension.     Palpations: Abdomen is soft. There is no mass.     Tenderness: There is no abdominal tenderness. There is no guarding or rebound.  Musculoskeletal: Normal range of motion.        General: No tenderness.  Lymphadenopathy:     Cervical: No cervical adenopathy.  Skin:    General: Skin is warm and dry.     Capillary Refill: Capillary refill takes less than 2 seconds.     Findings: No rash.  Neurological:     General: No focal deficit present.     Mental Status: She is alert and oriented to person, place, and time. Mental status is at baseline.  Psychiatric:        Mood and Affect: Mood normal.        Behavior: Behavior normal.        Thought Content: Thought content normal.        Judgment: Judgment normal.          Assessment & Plan    1. Benign essential HTN Elevated today because patient reports she has been out of her medications for 3 weeks. All medications were sent to [facility] on 01/02/19. Patient was advised to contact [facility] to see if they had been received. If not she needs to notify us so we can resend them. I did offer to send to a local pharmacy in the meantime but she declines. I will see her back in 4 weeks to recheck, hopefully she will be back on her medications by then.  - CBC w/Diff/Platelet - Comprehensive Metabolic Panel (CMET)  2. Weight loss Has lost 11 pounds since 12/21/18. When asked, she states she is just not hungry. She reports she ate 2 saltine crackers and that may be all she eats today. She denies having difficulty getting food. Just states that no food taste good anymore. I advised her my biggest concern is cancer. She states "don't say that." When asked about her cancer screening (colon cancer, lung cancer and breast cancer screenings) all of which she declines. I will see her back in 4 weeks for weight follow up.   3. RLS (restless legs syndrome) Patient reports she does not have the medications. Does not know why she is out. She does not see Dr. Brooke Dare until early April. I will give her a few extra lyrica at this time to her local pharmacy for her RLS. She is aware I am only filling this once. I will not fill her pain medications.  - pregabalin (LYRICA) 150 MG capsule; Take 1 capsule (150 mg total) by mouth 2 (two) times daily.  Dispense: 30 capsule; Refill: 0  4. Chronic obstructive pulmonary disease, unspecified COPD type (HCC) Patient continues to smoke. Restart inhalers once she receives them.   5. Pure hypercholesterolemia On atorvastatin 40mg . Will restart once meds received. Will check labs as below and f/u pending results. - CBC w/Diff/Platelet - Comprehensive Metabolic Panel (CMET) - Lipid Profile  6.  Alcoholic (HCC) Still drinking beer.  Declines quitting.   7. Renal artery stenosis (HCC) Followed by vascular.  8. Atrial paroxysmal tachycardia (HCC) Stable. On diltiazem  daily.   9. Hypokalemia Will check labs as below and f/u pending results. - Comprehensive Metabolic Panel (CMET)     Margaretann Loveless, PA-C  [facility]

## 2019-01-10 LAB — COMPREHENSIVE METABOLIC PANEL
ALT: 12 IU/L (ref 0–32)
AST: 21 IU/L (ref 0–40)
Albumin/Globulin Ratio: 1.6 (ref 1.2–2.2)
Albumin: 4.1 g/dL (ref 3.7–4.7)
Alkaline Phosphatase: 94 IU/L (ref 39–117)
BUN/Creatinine Ratio: 9 — ABNORMAL LOW (ref 12–28)
BUN: 7 mg/dL — ABNORMAL LOW (ref 8–27)
Bilirubin Total: 0.5 mg/dL (ref 0.0–1.2)
CO2: 23 mmol/L (ref 20–29)
Calcium: 10.1 mg/dL (ref 8.7–10.3)
Chloride: 97 mmol/L (ref 96–106)
Creatinine, Ser: 0.77 mg/dL (ref 0.57–1.00)
GFR calc Af Amer: 89 mL/min/{1.73_m2} (ref >59)
GFR calc non Af Amer: 77 mL/min/{1.73_m2} (ref >59)
Globulin, Total: 2.5 g/dL (ref 1.5–4.5)
Glucose: 83 mg/dL (ref 65–99)
Potassium: 4.5 mmol/L (ref 3.5–5.2)
Sodium: 138 mmol/L (ref 134–144)
Total Protein: 6.6 g/dL (ref 6.0–8.5)

## 2019-01-10 LAB — CBC WITH DIFFERENTIAL/PLATELET
Basophils Absolute: 0.1 10*3/uL (ref 0.0–0.2)
Basos: 1 %
EOS (ABSOLUTE): 0.1 10*3/uL (ref 0.0–0.4)
Eos: 2 %
Hematocrit: 44 % (ref 34.0–46.6)
Hemoglobin: 14.8 g/dL (ref 11.1–15.9)
Immature Grans (Abs): 0 10*3/uL (ref 0.0–0.1)
Immature Granulocytes: 0 %
Lymphocytes Absolute: 1.2 10*3/uL (ref 0.7–3.1)
Lymphs: 16 %
MCH: 32.7 pg (ref 26.6–33.0)
MCHC: 33.6 g/dL (ref 31.5–35.7)
MCV: 97 fL (ref 79–97)
Monocytes Absolute: 0.8 10*3/uL (ref 0.1–0.9)
Monocytes: 11 %
Neutrophils Absolute: 5 10*3/uL (ref 1.4–7.0)
Neutrophils: 70 %
Platelets: 264 10*3/uL (ref 150–450)
RBC: 4.52 x10E6/uL (ref 3.77–5.28)
RDW: 12.1 % (ref 11.7–15.4)
WBC: 7.1 10*3/uL (ref 3.4–10.8)

## 2019-01-10 LAB — LIPID PANEL
Chol/HDL Ratio: 2.6 ratio (ref 0.0–4.4)
Cholesterol, Total: 197 mg/dL (ref 100–199)
HDL: 77 mg/dL (ref >39)
LDL Calculated: 104 mg/dL — ABNORMAL HIGH (ref 0–99)
Triglycerides: 78 mg/dL (ref 0–149)
VLDL Cholesterol Cal: 16 mg/dL (ref 5–40)

## 2019-01-11 ENCOUNTER — Telehealth: Payer: Self-pay

## 2019-01-11 NOTE — Telephone Encounter (Signed)
-----   Message from Margaretann Loveless, [place] sent at 01/11/2019  7:39 AM EDT ----- All labs are within normal limits and stable.  Thanks! -JB

## 2019-01-11 NOTE — Telephone Encounter (Signed)
Pt advised.  She states she needs refills on Requip.  She was able to get her other prescriptions but that one did not come.  Thanks,   -Vernona Rieger

## 2019-01-11 NOTE — Telephone Encounter (Signed)
That one should be coming soon. That is the one I had to change on Monday.

## 2019-01-11 NOTE — Telephone Encounter (Signed)
LMTCB 01/11/2019  Thanks,   -Neyda Durango  

## 2019-01-28 ENCOUNTER — Ambulatory Visit: Payer: Medicare Other | Attending: Nurse Practitioner | Admitting: Nurse Practitioner

## 2019-01-28 ENCOUNTER — Other Ambulatory Visit: Payer: Self-pay

## 2019-01-28 DIAGNOSIS — M47816 Spondylosis without myelopathy or radiculopathy, lumbar region: Secondary | ICD-10-CM

## 2019-01-28 DIAGNOSIS — G8929 Other chronic pain: Secondary | ICD-10-CM | POA: Diagnosis not present

## 2019-01-28 DIAGNOSIS — M533 Sacrococcygeal disorders, not elsewhere classified: Secondary | ICD-10-CM

## 2019-01-28 DIAGNOSIS — M797 Fibromyalgia: Secondary | ICD-10-CM

## 2019-01-28 DIAGNOSIS — G894 Chronic pain syndrome: Secondary | ICD-10-CM

## 2019-01-28 DIAGNOSIS — M1612 Unilateral primary osteoarthritis, left hip: Secondary | ICD-10-CM

## 2019-01-28 MED ORDER — HYDROCODONE-ACETAMINOPHEN 7.5-325 MG PO TABS: 1.0000 | ORAL_TABLET | Freq: Four times a day (QID) | ORAL | 0 refills | Status: DC | PRN

## 2019-01-28 MED ORDER — PREGABALIN 150 MG PO CAPS: 150.0000 mg | ORAL_CAPSULE | Freq: Three times a day (TID) | ORAL | 0 refills | Status: DC

## 2019-01-28 NOTE — Progress Notes (Signed)
Pain Management Encounter Note - Virtual Visit via Telephone Telehealth (real-time audio visits between healthcare provider and patient).  Patient's Phone No. & Preferred Pharmacy:  [phone] (home); There is no such number on file (mobile).; (Preferred) [phone]  [id number] - [place], [place] - [address] AT [facility] [address] [place] [address] Phone: [phone] Fax: [phone]  [facility] - [place], [place] - [address] [place] [address] Phone: [phone] Fax: [phone]   Pre-screening note:  Our staff contacted Suzanne Avila and offered her an "in person", "face-to-face" appointment versus a telephone encounter. She indicated preferring the telephone encounter, at this time.  Reason for Virtual Visit: COVID-19*  Social distancing based on CDC and AMA recommendations.   I contacted Suzanne Avila on 01/28/2019 at 9:08 AM by telephone and clearly identified myself as Thad Ranger, NP. I verified that I was speaking with the correct person using two identifiers (Name and date of birth: Sep 10, 1946).  Advanced Informed Consent I sought verbal advanced consent from Suzanne Avila for telemedicine interactions and virtual visit. I informed Suzanne Avila of the security and privacy concerns, risks, and limitations associated with performing an evaluation and management service by telephone. I also informed Suzanne Avila of the availability of "in person" appointments and I informed her of the possibility of a patient responsible charge related to this service. Suzanne Avila expressed understanding and agreed to proceed.   Historic Elements   Suzanne Avila is a 73 y.o. year old, female patient evaluated today after her last encounter by our practice on 10/31/2018. Suzanne Avila  has a past medical history of Glaucoma, Hyperlipidemia, and Hypertension.  She also  has a past surgical history that includes Back surgery; Eye surgery; and Carpal tunnel release (Left). Suzanne Avila has a current medication list which includes the following prescription(s): albuterol, aspirin, atorvastatin, cholecalciferol, diltiazem, dorzolamide, fluticasone-salmeterol, hydrocodone-acetaminophen, hydrocodone-acetaminophen, hydrocodone-acetaminophen, latanoprost, meloxicam, omeprazole, potassium chloride, pregabalin, ramipril, ropinirole, sertraline, spironolactone, and vitamin b-12. She  reports that she has been smoking cigarettes. She has a 50.00 pack-year smoking history. She has never used smokeless tobacco. She reports current alcohol use of about 35.0 standard drinks of alcohol per week. She reports that she does not use drugs. Suzanne Avila is allergic to codeine; cyclobenzaprine; and penicillins.   HPI  I last saw her on 10/31/2018. She is being evaluated for medication management. She has left lower back pain that is about a 5/10. She has occasional left leg pain into her foot. She admits that it is sharp pain that comes and goes. She has stinging and burning. She admits when this happens she has difficulty walking. She feels like the pain is about the same. She just knows that she has limits with walking and standing for any long period of time.     Pharmacotherapy Assessment  Analgesic:Hydrocodone/APAP 7.5/325 one every 6 hours (30 mg per day) MME/day:30 mg/day  Monitoring: Pharmacotherapy: No side-effects or adverse reactions reported. [place] PMP: PDMP not reviewed this encounter.       Compliance: No problems identified. Plan: Refer to "POC".  Review of recent tests  DG Lumbar Spine Complete W/Bend CLINICAL DATA:  Fall 2 weeks ago with buttock pain, initial encounter  EXAM: LUMBAR SPINE - COMPLETE WITH BENDING VIEWS  COMPARISON:  02/14/2017  FINDINGS: Five lumbar type vertebral bodies are well visualized. Vertebral body height is well maintained.  Pedicle screws are noted at L4, L5 and S1 with posterior fixation. No hardware failure is noted. Progressive sclerosis is noted in L 3. No new compression deformities are seen. Aortic calcifications are noted. Flexion and extension views show no significant instability. Vacuum disc phenomenon is noted at L3-4.  IMPRESSION: No instability on flexion extension.  Chronic postsurgical and degenerative changes.  Electronically Signed   By: Alcide Clever M.D.   On: 10/31/2018 16:38 DG Si Joints CLINICAL DATA:  Sacroiliac joint pain. The patient fell 2 weeks ago. Left buttock pain and left groin pain.  EXAM: BILATERAL SACROILIAC JOINTS - 3+ VIEW  COMPARISON:  Radiographs dated 02/14/2017  FINDINGS: The sacroiliac joint spaces are maintained and there is no evidence of arthropathy. No other bone abnormalities are seen.  IMPRESSION: Negative.  Electronically Signed   By: Francene Boyers M.D.   On: 10/31/2018 16:38   Office Visit on 01/09/2019  Component Date Value Ref Range Status  . WBC 01/09/2019 7.1  3.4 - 10.8 x10E3/uL Final  . RBC 01/09/2019 4.52  3.77 - 5.28 x10E6/uL Final  . Hemoglobin 01/09/2019 14.8  11.1 - 15.9 g/dL Final  . Hematocrit 19/14/7829 44.0  34.0 - 46.6 % Final  . MCV 01/09/2019 97  79 - 97 fL Final  . MCH 01/09/2019 32.7  26.6 - 33.0 pg Final  . MCHC 01/09/2019 33.6  31.5 - 35.7 g/dL Final  . RDW 56/21/3086 12.1  11.7 - 15.4 % Final  . Platelets 01/09/2019 264  150 - 450 x10E3/uL Final  . Neutrophils 01/09/2019 70  Not Estab. % Final  . Lymphs 01/09/2019 16  Not Estab. % Final  . Monocytes 01/09/2019 11  Not Estab. % Final  . Eos 01/09/2019 2  Not Estab. % Final  . Basos 01/09/2019 1  Not Estab. % Final  . Neutrophils Absolute 01/09/2019 5.0  1.4 - 7.0 x10E3/uL Final  . Lymphocytes Absolute 01/09/2019 1.2  0.7 - 3.1 x10E3/uL Final  . Monocytes Absolute 01/09/2019 0.8  0.1 - 0.9 x10E3/uL Final  . EOS (ABSOLUTE) 01/09/2019 0.1  0.0 - 0.4 x10E3/uL Final   . Basophils Absolute 01/09/2019 0.1  0.0 - 0.2 x10E3/uL Final  . Immature Granulocytes 01/09/2019 0  Not Estab. % Final  . Immature Grans (Abs) 01/09/2019 0.0  0.0 - 0.1 x10E3/uL Final  . Glucose 01/09/2019 83  65 - 99 mg/dL Final  . BUN 57/84/6962 7* 8 - 27 mg/dL Final  . Creatinine, Ser 01/09/2019 0.77  0.57 - 1.00 mg/dL Final  . GFR calc non Af Amer 01/09/2019 77  >59 mL/min/1.73 Final  . GFR calc Af Amer 01/09/2019 89  >59 mL/min/1.73 Final  . BUN/Creatinine Ratio 01/09/2019 9* 12 - 28 Final  . Sodium 01/09/2019 138  134 - 144 mmol/L Final  . Potassium 01/09/2019 4.5  3.5 - 5.2 mmol/L Final  . Chloride 01/09/2019 97  96 - 106 mmol/L Final  . CO2 01/09/2019 23  20 - 29 mmol/L Final  . Calcium 01/09/2019 10.1  8.7 - 10.3 mg/dL Final  . Total Protein 01/09/2019 6.6  6.0 - 8.5 g/dL Final  . Albumin 95/28/4132 4.1  3.7 - 4.7 g/dL Final  . Globulin, Total 01/09/2019 2.5  1.5 - 4.5 g/dL Final  . Albumin/Globulin Ratio 01/09/2019 1.6  1.2 - 2.2 Final  . Bilirubin Total 01/09/2019 0.5  0.0 - 1.2 mg/dL Final  . Alkaline Phosphatase 01/09/2019 94  39 - 117 IU/L Final  . AST 01/09/2019 21  0 -  40 IU/L Final  . ALT 01/09/2019 12  0 - 32 IU/L Final  . Cholesterol, Total 01/09/2019 197  100 - 199 mg/dL Final  . Triglycerides 01/09/2019 78  0 - 149 mg/dL Final  . HDL 16/10/960403/18/2020 77  >39 mg/dL Final  . VLDL Cholesterol Cal 01/09/2019 16  5 - 40 mg/dL Final  . LDL Calculated 01/09/2019 540104* 0 - 99 mg/dL Final  . Chol/HDL Ratio 01/09/2019 2.6  0.0 - 4.4 ratio Final   Comment:                                   T. Chol/HDL Ratio                                             Men  Women                               1/2 Avg.Risk  3.4    3.3                                   Avg.Risk  5.0    4.4                                2X Avg.Risk  9.6    7.1                                3X Avg.Risk 23.4   11.0    Assessment  The primary encounter diagnosis was Lumbar spondylosis. Diagnoses of Chronic pain  syndrome, Fibromyalgia, Chronic sacroiliac joint pain, and Arthropathy of left hip were also pertinent to this visit.  Plan of Care  I have changed Suzanne Avila's HYDROcodone-acetaminophen, HYDROcodone-acetaminophen, and HYDROcodone-acetaminophen. I am also having her maintain her cholecalciferol, sertraline, spironolactone, albuterol, aspirin, atorvastatin, diltiazem, dorzolamide, Fluticasone-Salmeterol, latanoprost, meloxicam, omeprazole, potassium chloride, ramipril, vitamin B-12, rOPINIRole, and pregabalin.  Pharmacotherapy (Medications Ordered): Meds ordered this encounter  Medications  . pregabalin (LYRICA) 150 MG capsule    Sig: Take 1 capsule (150 mg total) by mouth every 8 (eight) hours.    Dispense:  270 capsule    Refill:  0    Do not place this medication, or any other prescription from our practice, on "Automatic Refill". Patient may have prescription filled one day early if pharmacy is closed on scheduled refill date.    Order Specific Question:   Supervising Provider    Answer:   Delano MetzNAVEIRA, FRANCISCO [phone][[id number]]  . HYDROcodone-acetaminophen (NORCO) 7.5-325 MG tablet    Sig: Take 1 tablet by mouth every 6 (six) hours as needed for up to 30 days for severe pain.    Dispense:  120 tablet    Refill:  0    Do not place this medication, or any other prescription from our practice, on "Automatic Refill". Patient may have prescription filled one day early if pharmacy is closed on scheduled refill date.    Order Specific Question:   Supervising Provider    Answer:   Delano MetzNAVEIRA, FRANCISCO [phone][[id number]]  . HYDROcodone-acetaminophen (NORCO) 7.5-325  MG tablet    Sig: Take 1 tablet by mouth every 6 (six) hours as needed for up to 30 days for severe pain.    Dispense:  120 tablet    Refill:  0    Do not place this medication, or any other prescription from our practice, on "Automatic Refill". Patient may have prescription filled one day early if pharmacy is closed on scheduled refill date.     Order Specific Question:   Supervising Provider    Answer:   Delano Metz [phone]  . HYDROcodone-acetaminophen (NORCO) 7.5-325 MG tablet    Sig: Take 1 tablet by mouth every 6 (six) hours as needed for up to 30 days for severe pain.    Dispense:  120 tablet    Refill:  0    Do not place this medication, or any other prescription from our practice, on "Automatic Refill". Patient may have prescription filled one day early if pharmacy is closed on scheduled refill date.    Order Specific Question:   Supervising Provider    Answer:   Delano Metz [phone]   Orders:  No orders of the defined types were placed in this encounter.  Follow-up plan:   Return in about 3 months (around 04/29/2019) for MedMgmt.   I discussed the assessment and treatment plan with the patient. The patient was provided an opportunity to ask questions and all were answered. The patient agreed with the plan and demonstrated an understanding of the instructions.  Patient advised to call back or seek an in-person evaluation if the symptoms or condition worsens.  Total duration of non-face-to-face encounter: 13 minutes.  Note by: Thad Ranger, NP Date: 01/28/2019; Time: 11:30 AM  Disclaimer:  * Given the special circumstances of the COVID-19 pandemic, the federal government has announced that the Office for Civil Rights (OCR) will exercise its enforcement discretion and will not impose penalties on physicians using telehealth in the event of noncompliance with regulatory requirements under the [employer] Portability and Accountability Act (HIPAA) in connection with the good faith provision of telehealth during the COVID-19 national public health emergency. (AMA)

## 2019-02-11 ENCOUNTER — Telehealth: Payer: Self-pay

## 2019-02-11 ENCOUNTER — Ambulatory Visit: Payer: Self-pay | Admitting: Physician Assistant

## 2019-02-11 NOTE — Telephone Encounter (Signed)
The patient states that Crystal was going to call out her Lyrica and so far the script has not been sent in to her pharmacy. Please call

## 2019-02-11 NOTE — Telephone Encounter (Signed)
Spoke with patient and she states that the medication had been sent and she got it today.  No further issues noted.

## 2019-03-20 ENCOUNTER — Ambulatory Visit (INDEPENDENT_AMBULATORY_CARE_PROVIDER_SITE_OTHER): Payer: Medicare Other | Admitting: Physician Assistant

## 2019-03-20 ENCOUNTER — Other Ambulatory Visit: Payer: Self-pay

## 2019-03-20 ENCOUNTER — Encounter: Payer: Self-pay | Admitting: Physician Assistant

## 2019-03-20 VITALS — BP 164/68 | HR 73 | Temp 98.3°F | Resp 16 | Wt 99.8 lb

## 2019-03-20 DIAGNOSIS — L2082 Flexural eczema: Secondary | ICD-10-CM | POA: Diagnosis not present

## 2019-03-20 DIAGNOSIS — I701 Atherosclerosis of renal artery: Secondary | ICD-10-CM | POA: Diagnosis not present

## 2019-03-20 DIAGNOSIS — R634 Abnormal weight loss: Secondary | ICD-10-CM

## 2019-03-20 DIAGNOSIS — E7800 Pure hypercholesterolemia, unspecified: Secondary | ICD-10-CM | POA: Diagnosis not present

## 2019-03-20 MED ORDER — ATORVASTATIN CALCIUM 40 MG PO TABS: 40.0000 mg | ORAL_TABLET | Freq: Every day | ORAL | 3 refills | Status: DC

## 2019-03-20 MED ORDER — TRIAMCINOLONE ACETONIDE 0.1 % EX CREA: 1.0000 "application " | TOPICAL_CREAM | Freq: Two times a day (BID) | CUTANEOUS | 0 refills | Status: DC

## 2019-03-20 NOTE — Patient Instructions (Signed)

## 2019-03-20 NOTE — Progress Notes (Signed)
Patient: Suzanne Avila Female    DOB: September 12, 1946   73 y.o.   MRN: [id number] Visit Date: 03/20/2019  Today's Provider: Margaretann Loveless, PA-C   Chief Complaint  Patient presents with  . Follow-up    weight loss   Subjective:     HPI  Patient here to follow-up on her weight loss. Patient was seen 2 months ago, provider wanted her to follow-up in 4 weeks. In the last office visit she had lost 11 pounds since 12/21/18. She reports her weight this morning on her scale was 102 pounds.    Wt Readings from Last 3 Encounters:  03/20/19 99 lb 12.8 oz (45.3 kg)  01/09/19 96 lb 9.6 oz (43.8 kg)  01/03/19 102 lb (46.3 kg)    Allergies  Allergen Reactions  . Codeine Itching  . Cyclobenzaprine Itching  . Penicillins     itching     Current Outpatient Medications:  .  albuterol (PROVENTIL HFA;VENTOLIN HFA) 108 (90 Base) MCG/ACT inhaler, ALBUTEROL, 90MCG/ACT (Inhalation Aerosol Solution)  2 PUFFS Every Day prn, Disp: 3 Inhaler, Rfl: 3 .  aspirin 325 MG tablet, Take 1 tablet (325 mg total) by mouth daily., Disp: 90 tablet, Rfl: 3 .  atorvastatin (LIPITOR) 40 MG tablet, Take 1 tablet (40 mg total) by mouth daily., Disp: 90 tablet, Rfl: 3 .  cholecalciferol (VITAMIN D) 1000 units tablet, Take 1 tablet (1,000 Units total) by mouth daily., Disp: 90 tablet, Rfl: 3 .  diltiazem (CARDIZEM) 120 MG tablet, Take 1 tablet (120 mg total) by mouth daily., Disp: 90 tablet, Rfl: 3 .  dorzolamide (TRUSOPT) 2 % ophthalmic solution, Place 1 drop into both eyes 3 (three) times daily., Disp: 10 mL, Rfl: 3 .  Fluticasone-Salmeterol (ADVAIR DISKUS) 250-50 MCG/DOSE AEPB, USE 1 INHALATION TWICE A DAY, Disp: 180 each, Rfl: 3 .  [START ON 04/11/2019] HYDROcodone-acetaminophen (NORCO) 7.5-325 MG tablet, Take 1 tablet by mouth every 6 (six) hours as needed for up to 30 days for severe pain., Disp: 120 tablet, Rfl: 0 .  HYDROcodone-acetaminophen (NORCO) 7.5-325 MG tablet, Take 1 tablet by mouth every 6 (six)  hours as needed for up to 30 days for severe pain., Disp: 120 tablet, Rfl: 0 .  latanoprost (XALATAN) 0.005 % ophthalmic solution, Place 1 drop into both eyes at bedtime., Disp: 2.5 mL, Rfl: 3 .  meloxicam (MOBIC) 15 MG tablet, Take 1 tablet (15 mg total) by mouth daily., Disp: 90 tablet, Rfl: 3 .  omeprazole (PRILOSEC) 40 MG capsule, Take 1 capsule (40 mg total) by mouth daily., Disp: 90 capsule, Rfl: 3 .  potassium chloride (KLOR-CON) 20 MEQ packet, Take 20 mEq by mouth daily., Disp: 90 tablet, Rfl: 3 .  pregabalin (LYRICA) 150 MG capsule, Take 1 capsule (150 mg total) by mouth every 8 (eight) hours., Disp: 270 capsule, Rfl: 0 .  ramipril (ALTACE) 10 MG capsule, Take 1 capsule (10 mg total) by mouth daily., Disp: 90 capsule, Rfl: 3 .  rOPINIRole (REQUIP) 2 MG tablet, Take 1 tablet (2 mg total) by mouth at bedtime., Disp: 90 tablet, Rfl: 1 .  sertraline (ZOLOFT) 50 MG tablet, Take 1 tablet (50 mg total) by mouth daily., Disp: 90 tablet, Rfl: 3 .  spironolactone (ALDACTONE) 25 MG tablet, Take 1 tablet (25 mg total) by mouth daily., Disp: 90 tablet, Rfl: 3 .  vitamin B-12 (CYANOCOBALAMIN) 500 MCG tablet, Take 1 tablet (500 mcg total) by mouth daily., Disp: 90 tablet, Rfl: 3 .  HYDROcodone-acetaminophen (NORCO) 7.5-325 MG tablet, Take 1 tablet by mouth every 6 (six) hours as needed for up to 30 days for severe pain., Disp: 120 tablet, Rfl: 0  Review of Systems  Constitutional: Negative.   Respiratory: Negative.   Cardiovascular: Positive for leg swelling. Negative for chest pain and palpitations.  Gastrointestinal: Negative.   Endocrine: Negative.   Musculoskeletal: Positive for arthralgias, back pain, gait problem and myalgias.  Neurological: Positive for numbness. Negative for dizziness, weakness, light-headedness and headaches.    Social History   Tobacco Use  . Smoking status: Current Every Day Smoker    Packs/day: 1.00    Years: 50.00    Pack years: 50.00    Types: Cigarettes  .  Smokeless tobacco: Never Used  Substance Use Topics  . Alcohol use: Yes    Alcohol/week: 35.0 standard drinks    Types: 35 Cans of beer per week      Objective:   BP (!) 164/68 (BP Location: Left Arm, Patient Position: Sitting, Cuff Size: Normal)   Pulse 73   Temp 98.3 F (36.8 C) (Oral)   Resp 16   Wt 99 lb 12.8 oz (45.3 kg)   BMI 20.16 kg/m  Vitals:   03/20/19 0946  BP: (!) 164/68  Pulse: 73  Resp: 16  Temp: 98.3 F (36.8 C)  TempSrc: Oral  Weight: 99 lb 12.8 oz (45.3 kg)     Physical Exam Vitals signs reviewed.  Constitutional:      General: She is not in acute distress.    Appearance: Normal appearance. She is well-developed and normal weight. She is not ill-appearing or diaphoretic.  Neck:     Musculoskeletal: Normal range of motion and neck supple.     Thyroid: No thyromegaly.     Vascular: No JVD.     Trachea: No tracheal deviation.  Cardiovascular:     Rate and Rhythm: Normal rate and regular rhythm.     Pulses: Normal pulses.     Heart sounds: Normal heart sounds. No murmur. No friction rub. No gallop.   Pulmonary:     Effort: Pulmonary effort is normal. No respiratory distress.     Breath sounds: Normal breath sounds. No wheezing or rales.  Lymphadenopathy:     Cervical: No cervical adenopathy.  Skin:    Capillary Refill: Capillary refill takes less than 2 seconds.  Neurological:     Mental Status: She is alert.         Assessment & Plan    1. Weight loss Weight has been stable around 100 pounds for the last 3 months.   2. Renal artery stenosis (HCC) Stable. Diagnosis pulled for medication refill. Continue current medical treatment plan. - atorvastatin (LIPITOR) 40 MG tablet; Take 1 tablet (40 mg total) by mouth daily.  Dispense: 90 tablet; Refill: 3  3. Pure hypercholesterolemia Stable. Diagnosis pulled for medication refill. Continue current medical treatment plan. - atorvastatin (LIPITOR) 40 MG tablet; Take 1 tablet (40 mg total) by  mouth daily.  Dispense: 90 tablet; Refill: 3  4. Flexural eczema Noted on her hands, cracked finger tips. Will use triamcinolone cream as below. Call if no improvements.  - triamcinolone cream (KENALOG) 0.1 %; Apply 1 application topically 2 (two) times daily.  Dispense: 30 g; Refill: 0     Margaretann LovelessJennifer M Ajax Schroll, PA-C  [facility]

## 2019-04-08 ENCOUNTER — Other Ambulatory Visit: Payer: Self-pay

## 2019-04-08 ENCOUNTER — Encounter: Payer: Self-pay | Admitting: Physician Assistant

## 2019-04-08 ENCOUNTER — Ambulatory Visit (INDEPENDENT_AMBULATORY_CARE_PROVIDER_SITE_OTHER): Payer: Medicare Other | Admitting: Physician Assistant

## 2019-04-08 VITALS — BP 154/71 | HR 67 | Temp 97.7°F | Resp 16 | Ht 59.0 in | Wt 100.6 lb

## 2019-04-08 DIAGNOSIS — K219 Gastro-esophageal reflux disease without esophagitis: Secondary | ICD-10-CM | POA: Diagnosis not present

## 2019-04-08 DIAGNOSIS — M1612 Unilateral primary osteoarthritis, left hip: Secondary | ICD-10-CM

## 2019-04-08 DIAGNOSIS — I872 Venous insufficiency (chronic) (peripheral): Secondary | ICD-10-CM | POA: Diagnosis not present

## 2019-04-08 DIAGNOSIS — E7800 Pure hypercholesterolemia, unspecified: Secondary | ICD-10-CM | POA: Diagnosis not present

## 2019-04-08 DIAGNOSIS — I10 Essential (primary) hypertension: Secondary | ICD-10-CM | POA: Diagnosis not present

## 2019-04-08 DIAGNOSIS — F4321 Adjustment disorder with depressed mood: Secondary | ICD-10-CM | POA: Diagnosis not present

## 2019-04-08 DIAGNOSIS — I701 Atherosclerosis of renal artery: Secondary | ICD-10-CM | POA: Diagnosis not present

## 2019-04-08 DIAGNOSIS — G2581 Restless legs syndrome: Secondary | ICD-10-CM

## 2019-04-08 MED ORDER — ATORVASTATIN CALCIUM 40 MG PO TABS: 40.0000 mg | ORAL_TABLET | Freq: Every day | ORAL | 3 refills | Status: DC

## 2019-04-08 MED ORDER — DILTIAZEM HCL 120 MG PO TABS: 120.0000 mg | ORAL_TABLET | Freq: Every day | ORAL | 3 refills | Status: DC

## 2019-04-08 MED ORDER — FUROSEMIDE 20 MG PO TABS: 20.0000 mg | ORAL_TABLET | Freq: Every day | ORAL | 3 refills | Status: DC

## 2019-04-08 MED ORDER — MELOXICAM 15 MG PO TABS: 15.0000 mg | ORAL_TABLET | Freq: Every day | ORAL | 3 refills | Status: DC

## 2019-04-08 MED ORDER — ROPINIROLE HCL 2 MG PO TABS: 2.0000 mg | ORAL_TABLET | Freq: Every day | ORAL | 1 refills | Status: DC

## 2019-04-08 MED ORDER — SPIRONOLACTONE 25 MG PO TABS: 25.0000 mg | ORAL_TABLET | Freq: Every day | ORAL | 3 refills | Status: DC

## 2019-04-08 MED ORDER — RAMIPRIL 10 MG PO CAPS: 10.0000 mg | ORAL_CAPSULE | Freq: Every day | ORAL | 3 refills | Status: DC

## 2019-04-08 MED ORDER — SERTRALINE HCL 50 MG PO TABS: 50.0000 mg | ORAL_TABLET | Freq: Every day | ORAL | 3 refills | Status: DC

## 2019-04-08 MED ORDER — OMEPRAZOLE 40 MG PO CPDR: 40.0000 mg | DELAYED_RELEASE_CAPSULE | Freq: Every day | ORAL | 3 refills | Status: DC

## 2019-04-08 MED ORDER — POTASSIUM CHLORIDE 20 MEQ PO PACK: 20.0000 meq | PACK | Freq: Every day | ORAL | 3 refills | Status: DC

## 2019-04-08 NOTE — Patient Instructions (Signed)

## 2019-04-08 NOTE — Progress Notes (Signed)
Patient: Suzanne Avila Female    DOB: 1946/03/16   73 y.o.   MRN: [id number] Visit Date: 04/08/2019  Today's Provider: Mar Daring, PA-C   Chief Complaint  Patient presents with  . Joint Swelling   Subjective:     HPI   Patient is here concerning swelling in her ankles. Patient states her ankles have been swelling for several months now. Patient states her ankles swell more throughout the day but go down over night.   Allergies  Allergen Reactions  . Codeine Itching  . Cyclobenzaprine Itching  . Penicillins     itching     Current Outpatient Medications:  .  albuterol (PROVENTIL HFA;VENTOLIN HFA) 108 (90 Base) MCG/ACT inhaler, ALBUTEROL, 90MCG/ACT (Inhalation Aerosol Solution)  2 PUFFS Every Day prn, Disp: 3 Inhaler, Rfl: 3 .  aspirin 325 MG tablet, Take 1 tablet (325 mg total) by mouth daily., Disp: 90 tablet, Rfl: 3 .  atorvastatin (LIPITOR) 40 MG tablet, Take 1 tablet (40 mg total) by mouth daily., Disp: 90 tablet, Rfl: 3 .  cholecalciferol (VITAMIN D) 1000 units tablet, Take 1 tablet (1,000 Units total) by mouth daily., Disp: 90 tablet, Rfl: 3 .  diltiazem (CARDIZEM) 120 MG tablet, Take 1 tablet (120 mg total) by mouth daily., Disp: 90 tablet, Rfl: 3 .  dorzolamide (TRUSOPT) 2 % ophthalmic solution, Place 1 drop into both eyes 3 (three) times daily., Disp: 10 mL, Rfl: 3 .  Fluticasone-Salmeterol (ADVAIR DISKUS) 250-50 MCG/DOSE AEPB, USE 1 INHALATION TWICE A DAY, Disp: 180 each, Rfl: 3 .  [START ON 04/11/2019] HYDROcodone-acetaminophen (NORCO) 7.5-325 MG tablet, Take 1 tablet by mouth every 6 (six) hours as needed for up to 30 days for severe pain., Disp: 120 tablet, Rfl: 0 .  HYDROcodone-acetaminophen (NORCO) 7.5-325 MG tablet, Take 1 tablet by mouth every 6 (six) hours as needed for up to 30 days for severe pain., Disp: 120 tablet, Rfl: 0 .  latanoprost (XALATAN) 0.005 % ophthalmic solution, Place 1 drop into both eyes at bedtime., Disp: 2.5 mL, Rfl: 3 .   omeprazole (PRILOSEC) 40 MG capsule, Take 1 capsule (40 mg total) by mouth daily., Disp: 90 capsule, Rfl: 3 .  potassium chloride (KLOR-CON) 20 MEQ packet, Take 20 mEq by mouth daily., Disp: 90 tablet, Rfl: 3 .  pregabalin (LYRICA) 150 MG capsule, Take 1 capsule (150 mg total) by mouth every 8 (eight) hours., Disp: 270 capsule, Rfl: 0 .  ramipril (ALTACE) 10 MG capsule, Take 1 capsule (10 mg total) by mouth daily., Disp: 90 capsule, Rfl: 3 .  rOPINIRole (REQUIP) 2 MG tablet, Take 1 tablet (2 mg total) by mouth at bedtime., Disp: 90 tablet, Rfl: 1 .  sertraline (ZOLOFT) 50 MG tablet, Take 1 tablet (50 mg total) by mouth daily., Disp: 90 tablet, Rfl: 3 .  spironolactone (ALDACTONE) 25 MG tablet, Take 1 tablet (25 mg total) by mouth daily., Disp: 90 tablet, Rfl: 3 .  triamcinolone cream (KENALOG) 0.1 %, Apply 1 application topically 2 (two) times daily., Disp: 30 g, Rfl: 0 .  vitamin B-12 (CYANOCOBALAMIN) 500 MCG tablet, Take 1 tablet (500 mcg total) by mouth daily., Disp: 90 tablet, Rfl: 3 .  HYDROcodone-acetaminophen (NORCO) 7.5-325 MG tablet, Take 1 tablet by mouth every 6 (six) hours as needed for up to 30 days for severe pain., Disp: 120 tablet, Rfl: 0 .  meloxicam (MOBIC) 15 MG tablet, Take 1 tablet (15 mg total) by mouth daily., Disp: 90 tablet, Rfl:  3  Review of Systems  Constitutional: Negative for appetite change, chills, fatigue and fever.  Respiratory: Negative for chest tightness and shortness of breath.   Cardiovascular: Positive for leg swelling. Negative for chest pain and palpitations.  Gastrointestinal: Negative for abdominal pain, nausea and vomiting.  Neurological: Negative for dizziness and weakness.    Social History   Tobacco Use  . Smoking status: Current Every Day Smoker    Packs/day: 1.00    Years: 50.00    Pack years: 50.00    Types: Cigarettes  . Smokeless tobacco: Never Used  Substance Use Topics  . Alcohol use: Yes    Alcohol/week: 35.0 standard drinks     Types: 35 Cans of beer per week      Objective:   BP (!) 154/71 (BP Location: Left Arm, Patient Position: Sitting, Cuff Size: Large)   Pulse 67   Temp 97.7 F (36.5 C) (Oral)   Resp 16   Ht 4\' 11"  (1.499 m)   Wt 100 lb 9.6 oz (45.6 kg)   SpO2 98%   BMI 20.32 kg/m  Vitals:   04/08/19 0908  BP: (!) 154/71  Pulse: 67  Resp: 16  Temp: 97.7 F (36.5 C)  TempSrc: Oral  SpO2: 98%  Weight: 100 lb 9.6 oz (45.6 kg)  Height: 4\' 11"  (1.499 m)     Physical Exam Vitals signs reviewed.  Constitutional:      General: She is not in acute distress.    Appearance: Normal appearance. She is well-developed and normal weight. She is not ill-appearing or diaphoretic.  HENT:     Head: Normocephalic and atraumatic.  Neck:     Musculoskeletal: Normal range of motion and neck supple.     Thyroid: No thyromegaly.     Vascular: No JVD.     Trachea: No tracheal deviation.  Cardiovascular:     Rate and Rhythm: Normal rate and regular rhythm.     Pulses: Normal pulses.     Heart sounds: Normal heart sounds. No murmur. No friction rub. No gallop.   Pulmonary:     Effort: Pulmonary effort is normal. No respiratory distress.     Breath sounds: Normal breath sounds. No wheezing or rales.  Musculoskeletal:     Right lower leg: Edema (1+ pitting ) present.     Left lower leg: Edema (1+ pitting) present.  Lymphadenopathy:     Cervical: No cervical adenopathy.  Skin:    General: Skin is warm and dry.     Capillary Refill: Capillary refill takes less than 2 seconds.  Neurological:     General: No focal deficit present.     Mental Status: She is alert and oriented to person, place, and time. Mental status is at baseline.        Assessment & Plan    1. Renal artery stenosis (HCC) Stable. Diagnosis pulled for medication refill. Continue current medical treatment plan. Has appt with Dr. Wyn Quakerew for f/u in Sept 2020.  - atorvastatin (LIPITOR) 40 MG tablet; Take 1 tablet (40 mg total) by mouth daily.   Dispense: 90 tablet; Refill: 3  2. Pure hypercholesterolemia Stable. Diagnosis pulled for medication refill. Continue current medical treatment plan. - atorvastatin (LIPITOR) 40 MG tablet; Take 1 tablet (40 mg total) by mouth daily.  Dispense: 90 tablet; Refill: 3  3. Essential hypertension Stable. Diagnosis pulled for medication refill. Continue current medical treatment plan. - diltiazem (CARDIZEM) 120 MG tablet; Take 1 tablet (120 mg total) by mouth daily.  Dispense: 90 tablet; Refill: 3 - ramipril (ALTACE) 10 MG capsule; Take 1 capsule (10 mg total) by mouth daily.  Dispense: 90 capsule; Refill: 3 - spironolactone (ALDACTONE) 25 MG tablet; Take 1 tablet (25 mg total) by mouth daily.  Dispense: 90 tablet; Refill: 3  4. Arthropathy of left hip Stable. Diagnosis pulled for medication refill. Continue current medical treatment plan. - meloxicam (MOBIC) 15 MG tablet; Take 1 tablet (15 mg total) by mouth daily.  Dispense: 90 tablet; Refill: 3  5. Gastroesophageal reflux disease without esophagitis Stable. Diagnosis pulled for medication refill. Continue current medical treatment plan. - omeprazole (PRILOSEC) 40 MG capsule; Take 1 capsule (40 mg total) by mouth daily.  Dispense: 90 capsule; Refill: 3 - potassium chloride (KLOR-CON) 20 MEQ packet; Take 20 mEq by mouth daily.  Dispense: 90 tablet; Refill: 3  6. RLS (restless legs syndrome) Stable. Diagnosis pulled for medication refill. Continue current medical treatment plan. - rOPINIRole (REQUIP) 2 MG tablet; Take 1 tablet (2 mg total) by mouth at bedtime.  Dispense: 90 tablet; Refill: 1  7. Situational depression Stable. Diagnosis pulled for medication refill. Continue current medical treatment plan. - sertraline (ZOLOFT) 50 MG tablet; Take 1 tablet (50 mg total) by mouth daily.  Dispense: 90 tablet; Refill: 3  8. Venous insufficiency Worsening. Discussed conservative therapy with furosemide as below. Wearing compression stockings and  elevating legs when at rest. Patient had legs crossed in room and advised to avoid sitting that way. May benefit from venous duplex to evaluate for reflux if symptoms persists. Also discussed importance of smoking cessation. F/U in 2-3 months. - furosemide (LASIX) 20 MG tablet; Take 1 tablet (20 mg total) by mouth daily.  Dispense: 90 tablet; Refill: 3     Margaretann LovelessJennifer M , PA-C  [facility]

## 2019-04-30 ENCOUNTER — Encounter: Payer: Self-pay | Admitting: Pain Medicine

## 2019-05-01 ENCOUNTER — Other Ambulatory Visit: Payer: Self-pay

## 2019-05-01 ENCOUNTER — Ambulatory Visit: Payer: Medicare Other | Attending: Pain Medicine | Admitting: Pain Medicine

## 2019-05-01 DIAGNOSIS — M79604 Pain in right leg: Secondary | ICD-10-CM | POA: Diagnosis not present

## 2019-05-01 DIAGNOSIS — M5388 Other specified dorsopathies, sacral and sacrococcygeal region: Secondary | ICD-10-CM

## 2019-05-01 DIAGNOSIS — M533 Sacrococcygeal disorders, not elsewhere classified: Secondary | ICD-10-CM | POA: Diagnosis not present

## 2019-05-01 DIAGNOSIS — G8929 Other chronic pain: Secondary | ICD-10-CM

## 2019-05-01 DIAGNOSIS — M47816 Spondylosis without myelopathy or radiculopathy, lumbar region: Secondary | ICD-10-CM

## 2019-05-01 DIAGNOSIS — M797 Fibromyalgia: Secondary | ICD-10-CM | POA: Diagnosis not present

## 2019-05-01 DIAGNOSIS — G894 Chronic pain syndrome: Secondary | ICD-10-CM | POA: Diagnosis not present

## 2019-05-01 DIAGNOSIS — R1032 Left lower quadrant pain: Secondary | ICD-10-CM

## 2019-05-01 DIAGNOSIS — M79605 Pain in left leg: Secondary | ICD-10-CM

## 2019-05-01 DIAGNOSIS — M47817 Spondylosis without myelopathy or radiculopathy, lumbosacral region: Secondary | ICD-10-CM | POA: Diagnosis not present

## 2019-05-01 MED ORDER — HYDROCODONE-ACETAMINOPHEN 7.5-325 MG PO TABS: 1.0000 | ORAL_TABLET | Freq: Four times a day (QID) | ORAL | 0 refills | Status: DC | PRN

## 2019-05-01 MED ORDER — PREGABALIN 150 MG PO CAPS: 150.0000 mg | ORAL_CAPSULE | Freq: Three times a day (TID) | ORAL | 0 refills | Status: DC

## 2019-05-01 NOTE — Patient Instructions (Signed)

## 2019-05-01 NOTE — Progress Notes (Signed)
Pain Management Virtual Encounter Note - Virtual Visit via Telephone Telehealth (real-time audio visits between healthcare provider and patient).   Patient's Phone No. & Preferred Pharmacy:  [phone] (home); There is no such number on file (mobile).; (Preferred) [phone] No e-mail address on record  [id number] - [place], [place] - [address] [facility] [address] [place] [address] Phone: [phone] Fax: [phone]    Pre-screening note:  Our staff contacted [name] and offered her an "in person", "face-to-face" appointment versus a telephone encounter. She indicated preferring the telephone encounter, at this time.   Reason for Virtual Visit: COVID-19*  Social distancing based on CDC and AMA recommendations.   I contacted Cathie HoopsJanet M Mellott on 05/01/2019 via telephone.      I clearly identified myself as Oswaldo DoneFrancisco A Layani Foronda, MD. I verified that I was speaking with the correct person using two identifiers (Name: Cathie HoopsJanet M Saine, and date of birth: October 09, 1946).  Advanced Informed Consent I sought verbal advanced consent from Cathie HoopsJanet M Haro for virtual visit interactions. I informed [name] of possible security and privacy concerns, risks, and limitations associated with providing "not-in-person" medical evaluation and management services. I also informed [name] of the availability of "in-person" appointments. Finally, I informed her that there would be a charge for the virtual visit and that she could be  personally, fully or partially, financially responsible for it. [name] expressed understanding and agreed to proceed.   Historic Elements   Ms. Cathie HoopsJanet M Hillmann is a 73 y.o. year old, female patient evaluated today after her last encounter by our practice on 02/11/2019. [name]  has a past medical history of Glaucoma, Hyperlipidemia, and Hypertension. She also  has a past surgical  history that includes Back surgery; Eye surgery; and Carpal tunnel release (Left). [name] has a current medication list which includes the following prescription(s): albuterol, aspirin, atorvastatin, cholecalciferol, diltiazem, dorzolamide, fluticasone-salmeterol, furosemide, hydrocodone-acetaminophen, hydrocodone-acetaminophen, hydrocodone-acetaminophen, latanoprost, meloxicam, multi-vitamin, omeprazole, potassium chloride, pregabalin, ramipril, ropinirole, sertraline, spironolactone, triamcinolone cream, and vitamin b-12. She  reports that she has been smoking cigarettes. She has a 50.00 pack-year smoking history. She has never used smokeless tobacco. She reports current alcohol use of about 35.0 standard drinks of alcohol per week. She reports that she does not use drugs. [name] is allergic to codeine; cyclobenzaprine; and penicillins.   HPI  Today, she is being contacted for medication management.  The patient indicates not having any problems, side effects, or adverse reactions to any of her medications.  [facility] but the Lyrica she is obtaining it through mail order.  The patient indicates lately having a lot more pain in her back, buttocks, leg, and groin area.  She indicates that the worst pain is in the lower back towards the midline but with a bilateral component where the left side is worse than the right.  This pain goes to the buttocks area and down the posterior aspect of the leg to the level of the ankle, suggesting a referred pattern from the facets.  She is also complaining of pain going to the left groin area.  The combination of the left lower back pain being worse, the left groin, and the left lower extremity pain would suggest involvement of generally the facet joints, but also the SI joint.  The patient wants to come in for an interventional therapy in that region.  I will  be scheduling her to come in for a bilateral versus  left-sided lumbar facet block + left-sided sacroiliac joint block under fluoroscopic guidance and IV sedation.  Should the patient get good benefits, but no long-term relief, we will consider the possibility of radiofrequency ablation.  Pharmacotherapy Assessment  Analgesic: Hydrocodone/APAP 7.5/325, 1 tab PO q 6 hrs (30 mg/day of hydrocodone) MME/day:30 mg/day.   Monitoring: Pharmacotherapy: No side-effects or adverse reactions reported. [place] PMP: PDMP reviewed during this encounter.       Compliance: No problems identified. Effectiveness: Clinically acceptable. Plan: Refer to "POC".  Pertinent Labs   SAFETY SCREENING Profile No results found for: SARSCOV2NAA, COVIDSOURCE, STAPHAUREUS, MRSAPCR, HCVAB, HIV, PREGTESTUR Renal Function Lab Results  Component Value Date   BUN 7 (L) 01/09/2019   CREATININE 0.77 01/09/2019   BCR 9 (L) 01/09/2019   GFRAA 89 01/09/2019   GFRNONAA 77 01/09/2019   Hepatic Function Lab Results  Component Value Date   AST 21 01/09/2019   ALT 12 01/09/2019   ALBUMIN 4.1 01/09/2019   UDS Summary  Date Value Ref Range Status  08/02/2018 FINAL  Final    Comment:    ==================================================================== TOXASSURE SELECT 13 (MW) ==================================================================== Test                             Result       Flag       Units Drug Present and Declared for Prescription Verification   Hydrocodone                    3379         EXPECTED   ng/mg creat   Hydromorphone                  1755         EXPECTED   ng/mg creat   Dihydrocodeine                 230          EXPECTED   ng/mg creat   Norhydrocodone                 1521         EXPECTED   ng/mg creat    Sources of hydrocodone include scheduled prescription    medications. Hydromorphone, dihydrocodeine and norhydrocodone are    expected metabolites of hydrocodone. Hydromorphone and    dihydrocodeine are also available as scheduled  prescription    medications. ==================================================================== Test                      Result    Flag   Units      Ref Range   Creatinine              33               mg/dL      >=16>=20 ==================================================================== Declared Medications:  The flagging and interpretation on this report are based on the  following declared medications.  Unexpected results may arise from  inaccuracies in the declared medications.  **Note: The testing scope of this panel includes these medications:  Hydrocodone (Norco)  **Note: The testing scope of this panel does not include following  reported medications:  Acetaminophen (Norco)  Albuterol  Aspirin  Atorvastatin (Lipitor)  Cyanocobalamin  Diltiazem (Cardizem)  Dorzolamide  Fluticasone (Advair)  Latanoprost (Xalatan)  Meloxicam (Mobic)  Omeprazole (Prilosec)  Potassium (Klor-Con)  Pregabalin (Lyrica)  Ramipril (Altace)  Ropinirole (Requip)  Salmeterol (Advair)  Spironolactone (Aldactone)  Vitamin D ==================================================================== For clinical consultation, please call [phone]. ====================================================================    Note: Above Lab results reviewed.  Recent imaging  DG Lumbar Spine Complete W/Bend CLINICAL DATA:  Fall 2 weeks ago with buttock pain, initial encounter  EXAM: LUMBAR SPINE - COMPLETE WITH BENDING VIEWS  COMPARISON:  02/14/2017  FINDINGS: Five lumbar type vertebral bodies are well visualized. Vertebral body height is well maintained. Pedicle screws are noted at L4, L5 and S1 with posterior fixation. No hardware failure is noted. Progressive sclerosis is noted in L 3. No new compression deformities are seen. Aortic calcifications are noted. Flexion and extension views show no significant instability. Vacuum disc phenomenon is noted at L3-4.  IMPRESSION: No instability  on flexion extension.  Chronic postsurgical and degenerative changes.  Electronically Signed   By: Inez Catalina M.D.   On: 10/31/2018 16:38 DG Si Joints CLINICAL DATA:  Sacroiliac joint pain. The patient fell 2 weeks ago. Left buttock pain and left groin pain.  EXAM: BILATERAL SACROILIAC JOINTS - 3+ VIEW  COMPARISON:  Radiographs dated 02/14/2017  FINDINGS: The sacroiliac joint spaces are maintained and there is no evidence of arthropathy. No other bone abnormalities are seen.  IMPRESSION: Negative.  Electronically Signed   By: Lorriane Shire M.D.   On: 10/31/2018 16:38  Assessment  The primary encounter diagnosis was Chronic pain syndrome. 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  Plan of Care  I have discontinued Asherah Lavoy. Hossain's HYDROcodone-acetaminophen and HYDROcodone-acetaminophen. I have also changed her HYDROcodone-acetaminophen. Additionally, I am having her start on HYDROcodone-acetaminophen and HYDROcodone-acetaminophen. Lastly, I am having her maintain her cholecalciferol, albuterol, aspirin, dorzolamide, Fluticasone-Salmeterol, latanoprost, vitamin B-12, triamcinolone cream, atorvastatin, diltiazem, meloxicam, omeprazole, potassium chloride, ramipril, rOPINIRole, sertraline, spironolactone, furosemide, Multi-Vitamin, and pregabalin.  Pharmacotherapy (Medications Ordered): Meds ordered this encounter  Medications  . DISCONTD: pregabalin (LYRICA) 150 MG capsule    Sig: Take 1 capsule (150 mg total) by mouth every 8 (eight) hours.    Dispense:  270 capsule    Refill:  0    Fill one day early if pharmacy is closed on scheduled refill  date. May substitute for generic if available.  Marland Kitchen HYDROcodone-acetaminophen (NORCO) 7.5-325 MG tablet    Sig: Take 1 tablet by mouth every 6 (six) hours as needed for severe pain. Must last 30 days    Dispense:  120 tablet    Refill:  0    Chronic Pain: STOP Act (Not applicable) Fill 1 day early if closed on refill date. Do not fill until: 05/14/2019. To last until: 06/13/2019. Avoid benzodiazepines within 8 hours of opioids  . HYDROcodone-acetaminophen (NORCO) 7.5-325 MG tablet    Sig: Take 1 tablet by mouth every 6 (six) hours as needed for severe pain. Must last 30 days    Dispense:  120 tablet    Refill:  0    Chronic Pain: STOP Act (Not applicable) Fill 1 day early if closed on refill date. Do not fill until: 06/13/2019. To last until: 07/13/2019. Avoid benzodiazepines within 8 hours of opioids  . HYDROcodone-acetaminophen (NORCO) 7.5-325 MG tablet    Sig: Take 1 tablet by mouth every 6 (six)  hours as needed for severe pain. Must last 30 days    Dispense:  120 tablet    Refill:  0    Chronic Pain: STOP Act (Not applicable) Fill 1 day early if closed on refill date. Do not fill until: 07/13/2019. To last until: 08/12/2019. Avoid benzodiazepines within 8 hours of opioids  . pregabalin (LYRICA) 150 MG capsule    Sig: Take 1 capsule (150 mg total) by mouth every 8 (eight) hours.    Dispense:  270 capsule    Refill:  0    Fill one day early if pharmacy is closed on scheduled refill date. May substitute for generic if available.   Orders:  Orders Placed This Encounter  Procedures  . LUMBAR FACET(MEDIAL BRANCH NERVE BLOCK) MBNB    Standing Status:   Future    Standing Expiration Date:   06/01/2019    Scheduling Instructions:     Side: Bilateral     Level: L3-4, L4-5, & L5-S1 Facets (L2, L3, L4, L5, & S1 Medial Branch Nerves)     Sedation: With Sedation.     Timeframe: ASAA    Order Specific Question:   Where will this procedure be performed?    Answer:   [facility]  . SACROILIAC  JOINT INJECTION    Standing Status:   Future    Standing Expiration Date:   06/01/2019    Scheduling Instructions:     Side: Left-sided     Sedation: With Sedation.     Timeframe: ASAP    Order Specific Question:   Where will this procedure be performed?    Answer:   [facility]   Follow-up plan:   Return in about 14 weeks (around 08/07/2019) for (VV), E/M (MM), in addition, Procedure (w/ sedation): (B) L-FCT Blk + (L) S-I BLK.     Considering:   Diagnostic bilateral intra-articular hip joint injection  Possible bilateral hip joint RFA  Diagnostic bilateral lumbar facet block  Possible bilateral lumbar facet RFA  Diagnostic bilateral L4-L5 TFESI  Diagnostic caudal ESI  Diagnostic caudal epidurogram  Possible RACZ epidural lysis of adhesions  Diagnostic left-sided L2-3 LESI  Possible bilateral Lumbar SCS trial implant    Palliative PRN treatment(s):   Diagnostic bilateral intra-articular hip joint injection  Diagnostic bilateral lumbar facet block  Diagnostic bilateral L4-L5 TFESI  Diagnostic caudal ESI  Diagnostic caudal epidurogram  Diagnostic left-sided L2-3 LESI     Recent Visits No visits were found meeting these conditions.  Showing recent visits within past 90 days and meeting all other requirements   Today's Visits Date Type Provider Dept  05/01/19 Office Visit Delano MetzNaveira, Clifford Benninger, MD [facility]  Showing today's visits and meeting all other requirements   Future Appointments No visits were found meeting these conditions.  Showing future appointments within next 90 days and meeting all other requirements   I discussed the assessment and treatment plan with the patient. The patient was provided an opportunity to ask questions and all were answered. The patient agreed with the plan and demonstrated an understanding of the instructions.  Patient advised to call back or seek an in-person evaluation if the symptoms or condition worsens.  Total  duration of non-face-to-face encounter: 15 minutes.  Note by: Oswaldo DoneFrancisco A Latria Mccarron, MD Date: 05/01/2019; Time: 1:19 PM  Note: This dictation was prepared with Dragon dictation. Any transcriptional errors that may result from this process are unintentional.  Disclaimer:  * Given the special circumstances of the COVID-19 pandemic,  the federal government has announced that [employer] ([employer]) will exercise its enforcement discretion and will not impose penalties on physicians using telehealth in the event of noncompliance with regulatory requirements under the [place] (HIPAA) in connection with the good faith provision of telehealth during the IRSWN-46 national public health emergency. ([place])

## 2019-06-27 ENCOUNTER — Encounter: Payer: Self-pay | Admitting: Pain Medicine

## 2019-06-27 ENCOUNTER — Ambulatory Visit (HOSPITAL_BASED_OUTPATIENT_CLINIC_OR_DEPARTMENT_OTHER): Payer: Medicare Other | Admitting: Pain Medicine

## 2019-06-27 ENCOUNTER — Other Ambulatory Visit: Payer: Self-pay

## 2019-06-27 ENCOUNTER — Ambulatory Visit
Admission: RE | Admit: 2019-06-27 | Discharge: 2019-06-27 | Disposition: A | Discharge status: Home / Self Care | Payer: Medicare Other | Source: Ambulatory Visit | Attending: Pain Medicine | Admitting: Pain Medicine

## 2019-06-27 VITALS — BP 149/66 | HR 75 | Temp 98.2°F | Resp 18 | Ht 59.0 in | Wt 99.0 lb

## 2019-06-27 DIAGNOSIS — M5441 Lumbago with sciatica, right side: Secondary | ICD-10-CM | POA: Insufficient documentation

## 2019-06-27 DIAGNOSIS — G8929 Other chronic pain: Secondary | ICD-10-CM | POA: Insufficient documentation

## 2019-06-27 DIAGNOSIS — M47816 Spondylosis without myelopathy or radiculopathy, lumbar region: Secondary | ICD-10-CM | POA: Diagnosis not present

## 2019-06-27 DIAGNOSIS — M5442 Lumbago with sciatica, left side: Secondary | ICD-10-CM | POA: Diagnosis not present

## 2019-06-27 DIAGNOSIS — M5388 Other specified dorsopathies, sacral and sacrococcygeal region: Secondary | ICD-10-CM | POA: Diagnosis not present

## 2019-06-27 DIAGNOSIS — M47817 Spondylosis without myelopathy or radiculopathy, lumbosacral region: Secondary | ICD-10-CM | POA: Diagnosis not present

## 2019-06-27 DIAGNOSIS — M533 Sacrococcygeal disorders, not elsewhere classified: Secondary | ICD-10-CM | POA: Diagnosis not present

## 2019-06-27 MED ORDER — LACTATED RINGERS IV SOLN
1000.0000 mL | Freq: Once | INTRAVENOUS | Status: AC
  Administered 2019-06-27: 1000 mL via INTRAVENOUS

## 2019-06-27 MED ORDER — FENTANYL CITRATE (PF) 100 MCG/2ML IJ SOLN
25.0000 ug | INTRAMUSCULAR | Status: DC | PRN
  Administered 2019-06-27: 50 ug via INTRAVENOUS
  Filled 2019-06-27: qty 2

## 2019-06-27 MED ORDER — ROPIVACAINE HCL 2 MG/ML IJ SOLN
18.0000 mL | Freq: Once | INTRAMUSCULAR | Status: AC
  Administered 2019-06-27: 18 mL via PERINEURAL
  Filled 2019-06-27: qty 20

## 2019-06-27 MED ORDER — MIDAZOLAM HCL 5 MG/5ML IJ SOLN
1.0000 mg | INTRAMUSCULAR | Status: DC | PRN
  Administered 2019-06-27: 2 mg via INTRAVENOUS
  Filled 2019-06-27: qty 5

## 2019-06-27 MED ORDER — METHYLPREDNISOLONE ACETATE 80 MG/ML IJ SUSP
80.0000 mg | Freq: Once | INTRAMUSCULAR | Status: AC
  Administered 2019-06-27: 80 mg via INTRA_ARTICULAR
  Filled 2019-06-27: qty 1

## 2019-06-27 MED ORDER — LIDOCAINE HCL 2 % IJ SOLN
20.0000 mL | Freq: Once | INTRAMUSCULAR | Status: AC
  Administered 2019-06-27: 400 mg
  Filled 2019-06-27: qty 40

## 2019-06-27 MED ORDER — TRIAMCINOLONE ACETONIDE 40 MG/ML IJ SUSP
80.0000 mg | Freq: Once | INTRAMUSCULAR | Status: AC
  Administered 2019-06-27: 80 mg
  Filled 2019-06-27: qty 2

## 2019-06-27 MED ORDER — ROPIVACAINE HCL 2 MG/ML IJ SOLN
4.0000 mL | Freq: Once | INTRAMUSCULAR | Status: AC
  Administered 2019-06-27: 4 mL via INTRA_ARTICULAR
  Filled 2019-06-27: qty 10

## 2019-06-27 NOTE — Progress Notes (Signed)
Safety precautions to be maintained throughout the outpatient stay will include: orient to surroundings, keep bed in low position, maintain call bell within reach at all times, provide assistance with transfer out of bed and ambulation.  

## 2019-06-27 NOTE — Patient Instructions (Signed)

## 2019-06-27 NOTE — Progress Notes (Signed)
Patient's Name: Suzanne Avila  MRN: [id number]  Referring Provider: Suella Grove*  DOB: 1946-06-26  PCP: Margaretann Loveless, PA-C  DOS: 06/27/2019  Note by: Oswaldo Done, MD  Service setting: Ambulatory outpatient  Specialty: Interventional Pain Management  Patient type: Established  Location: [facility] (AMB) Pain Management Facility  Visit type: Interventional Procedure   Primary Reason for Visit: Interventional Pain Management Treatment. CC: Back Pain (low)  Procedure:          Anesthesia, Analgesia, Anxiolysis:  Procedure #1: Type: Medial Branch Facet Block #1 Primary Purpose: Diagnostic Region: Lumbar Level: L2, L3, L4, L5, & S1 Medial Branch Level(s) Target Area: For Lumbar Facet blocks, the target is the groove formed by the junction of the transverse process and superior articular process. For the L5 dorsal ramus, the target is the notch between superior articular process and sacral ala. For the S1 dorsal ramus, the target is the superior and lateral edge of the posterior S1 Sacral foramen. Approach: Posterior, paramedial, percutaneous approach. Laterality: Bilateral  Procedure #2: Type: Sacroiliac Joint Block  #1  Primary Purpose: Diagnostic Region: Posterior Lumbosacral Level: PSIS (Posterior Superior Iliac Spine) Sacroiliac Joint Target Area: For upper sacroiliac joint block(s), the target is the superior and posterior margin of the sacroiliac joint. Approach: Ipsilateral approach. Laterality: Left  Type: Moderate (Conscious) Sedation combined with Local Anesthesia Indication(s): Analgesia and Anxiety Route: Intravenous (IV) IV Access: Secured Sedation: Meaningful verbal contact was maintained at all times during the procedure  Local Anesthetic: Lidocaine 1-2%  Position: Prone   Indications: 1. 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 Spondylosis without myelopathy or radiculopathy, lumbosacral region   3. Chronic sacroiliac joint pain (Left)   4.  Other specified dorsopathies, sacral and sacrococcygeal region   5. Lumbar spondylosis   6. Chronic low back pain (Secondary area of Pain) (Bilateral) (L>R)    Pain Score: Pre-procedure: 7 /10 Post-procedure: 0-No pain/10   Pre-op Assessment:  Suzanne Avila is a 73 y.o. (year old), female patient, seen today for interventional treatment. She  has a past surgical history that includes Back surgery; Eye surgery; and Carpal tunnel release (Left). Suzanne Avila has a current medication list which includes the following prescription(s): albuterol, aspirin, atorvastatin, cholecalciferol, diltiazem, dorzolamide, fluticasone-salmeterol, furosemide, hydrocodone-acetaminophen, hydrocodone-acetaminophen, latanoprost, meloxicam, multi-vitamin, omeprazole, potassium chloride, pregabalin, ramipril, ropinirole, sertraline, spironolactone, triamcinolone cream, vitamin b-12, and hydrocodone-acetaminophen, and the following Facility-Administered Medications: fentanyl and midazolam. Her primarily concern today is the Back Pain (low)  Initial Vital Signs:  Pulse/HCG Rate: 75ECG Heart Rate: 73 Temp: 98 F (36.7 C) Resp: 18 BP: (!) 161/65 SpO2: 97 %  BMI: Estimated body mass index is 20 kg/m as calculated from the following:   Height as of this encounter: 4\' 11"  (1.499 m).   Weight as of this encounter: 99 lb (44.9 kg).  Risk Assessment: Allergies: Reviewed. She is allergic to codeine; cyclobenzaprine; and penicillins.  Allergy Precautions: None required Coagulopathies: Reviewed. None identified.  Blood-thinner therapy: None at this time Active Infection(s): Reviewed. None identified. Suzanne Avila is afebrile  Site Confirmation: Suzanne Avila was asked to confirm the procedure and laterality before marking the site Procedure checklist: Completed Consent: Before the procedure and under the influence of no sedative(s), amnesic(s), or anxiolytics, the patient was informed of the treatment options, risks and  possible complications. To fulfill our ethical and legal obligations, as recommended by the American Medical Association's Code of Ethics, I have informed the patient of my clinical impression; the nature and  purpose of the treatment or procedure; the risks, benefits, and possible complications of the intervention; the alternatives, including doing nothing; the risk(s) and benefit(s) of the alternative treatment(s) or procedure(s); and the risk(s) and benefit(s) of doing nothing. The patient was provided information about the general risks and possible complications associated with the procedure. These may include, but are not limited to: failure to achieve desired goals, infection, bleeding, organ or nerve damage, allergic reactions, paralysis, and death. In addition, the patient was informed of those risks and complications associated to Spine-related procedures, such as failure to decrease pain; infection (i.e.: Meningitis, epidural or intraspinal abscess); bleeding (i.e.: epidural hematoma, subarachnoid hemorrhage, or any other type of intraspinal or peri-dural bleeding); organ or nerve damage (i.e.: Any type of peripheral nerve, nerve root, or spinal cord injury) with subsequent damage to sensory, motor, and/or autonomic systems, resulting in permanent pain, numbness, and/or weakness of one or several areas of the body; allergic reactions; (i.e.: anaphylactic reaction); and/or death. Furthermore, the patient was informed of those risks and complications associated with the medications. These include, but are not limited to: allergic reactions (i.e.: anaphylactic or anaphylactoid reaction(s)); adrenal axis suppression; blood sugar elevation that in diabetics may result in ketoacidosis or comma; water retention that in patients with history of congestive heart failure may result in shortness of breath, pulmonary edema, and decompensation with resultant heart failure; weight gain; swelling or edema;  medication-induced neural toxicity; particulate matter embolism and blood vessel occlusion with resultant organ, and/or nervous system infarction; and/or aseptic necrosis of one or more joints. Finally, the patient was informed that Medicine is not an exact science; therefore, there is also the possibility of unforeseen or unpredictable risks and/or possible complications that may result in a catastrophic outcome. The patient indicated having understood very clearly. We have given the patient no guarantees and we have made no promises. Enough time was given to the patient to ask questions, all of which were answered to the patient's satisfaction. Ms. Servatius has indicated that she wanted to continue with the procedure. Attestation: I, the ordering provider, attest that I have discussed with the patient the benefits, risks, side-effects, alternatives, likelihood of achieving goals, and potential problems during recovery for the procedure that I have provided informed consent. Date  Time: 06/27/2019  9:44 AM  Pre-Procedure Preparation:  Monitoring: As per clinic protocol. Respiration, ETCO2, SpO2, BP, heart rate and rhythm monitor placed and checked for adequate function Safety Precautions: Patient was assessed for positional comfort and pressure points before starting the procedure. Time-out: I initiated and conducted the "Time-out" before starting the procedure, as per protocol. The patient was asked to participate by confirming the accuracy of the "Time Out" information. Verification of the correct person, site, and procedure were performed and confirmed by me, the nursing staff, and the patient. "Time-out" conducted as per Joint Commission's Universal Protocol (UP.01.01.01). Time: 1042  Description of Procedure #1:   Time-out: "Time-out" completed before starting procedure, as per protocol. Area Prepped: Entire Posterior Lumbosacral Region Prepping solution: DuraPrep (Iodine Povacrylex [0.7%  available iodine] and Isopropyl Alcohol, 74% w/w) Safety Precautions: Aspiration looking for blood return was conducted prior to all injections. At no point did we inject any substances, as a needle was being advanced. No attempts were made at seeking any paresthesias. Safe injection practices and needle disposal techniques used. Medications properly checked for expiration dates. SDV (single dose vial) medications used.  Description of the Procedure: Protocol guidelines were followed. The patient was placed in position over  the fluoroscopy table. The target area was identified and the area prepped in the usual manner. Skin & deeper tissues infiltrated with local anesthetic. Appropriate amount of time allowed to pass for local anesthetics to take effect. The procedure needle was introduced through the skin, ipsilateral to the reported pain, and advanced to the target area. Employing the "Medial Branch Technique", the needles were advanced to the angle made by the superior and medial portion of the transverse process, and the lateral and inferior portion of the superior articulating process of the targeted vertebral bodies. This area is known as "Burton's Eye" or the "Eye of the [place] Dog". A procedure needle was introduced through the skin, and this time advanced to the angle made by the superior and medial border of the sacral ala, and the lateral border of the S1 vertebral body. This last needle was later repositioned at the superior and lateral border of the posterior S1 foramen. Negative aspiration confirmed. Solution injected in intermittent fashion, asking for systemic symptoms every 0.5cc of injectate. The needles were then removed and the area cleansed, making sure to leave some of the prepping solution back to take advantage of its long term bactericidal properties. Start Time: 1042 hrs. Materials:  Needle(s) Type: Spinal Needle Gauge: 22G Length: 3.5-in Medication(s): Please see orders for  medications and dosing details.  Description of Procedure # 2:   Area Prepped: Entire Posterior Lumbosacral Region Prepping solution: DuraPrep (Iodine Povacrylex [0.7% available iodine] and Isopropyl Alcohol, 74% w/w) Safety Precautions: Aspiration looking for blood return was conducted prior to all injections. At no point did we inject any substances, as a needle was being advanced. No attempts were made at seeking any paresthesias. Safe injection practices and needle disposal techniques used. Medications properly checked for expiration dates. SDV (single dose vial) medications used. Description of the Procedure: Protocol guidelines were followed. The patient was placed in position over the fluoroscopy table. The target area was identified and the area prepped in the usual manner. Skin & deeper tissues infiltrated with local anesthetic. Appropriate amount of time allowed to pass for local anesthetics to take effect. The procedure needle was advanced under fluoroscopic guidance into the sacroiliac joint until a firm endpoint was obtained. Proper needle placement secured. Negative aspiration confirmed. Solution injected in intermittent fashion, asking for systemic symptoms every 0.5cc of injectate. The needles were then removed and the area cleansed, making sure to leave some of the prepping solution back to take advantage of its long term bactericidal properties. Vitals:   06/27/19 1056 06/27/19 1104 06/27/19 1114 06/27/19 1124  BP: (!) 142/72 (!) 142/58 128/66 (!) 149/66  Pulse:      Resp: 12 16 15 18   Temp:  98 F (36.7 C)  98.2 F (36.8 C)  TempSrc:  Temporal  Temporal  SpO2: 98% 99% 97% 100%  Weight:      Height:        End Time: 1055 hrs. Materials:  Needle(s) Type: Spinal Needle Gauge: 22G Length: 3.5-in Medication(s): Please see orders for medications and dosing details.  Imaging Guidance (Spinal):          Type of Imaging Technique: Fluoroscopy Guidance (Spinal) Indication(s):  Assistance in needle guidance and placement for procedures requiring needle placement in or near specific anatomical locations not easily accessible without such assistance. Exposure Time: Please see nurses notes. Contrast: None used. Fluoroscopic Guidance: I was personally present during the use of fluoroscopy. "Tunnel Vision Technique" used to obtain the best possible view of the  target area. Parallax error corrected before commencing the procedure. "Direction-depth-direction" technique used to introduce the needle under continuous pulsed fluoroscopy. Once target was reached, antero-posterior, oblique, and lateral fluoroscopic projection used confirm needle placement in all planes. Images permanently stored in EMR. Interpretation: No contrast injected. I personally interpreted the imaging intraoperatively. Adequate needle placement confirmed in multiple planes. Permanent images saved into the patient's record.  Antibiotic Prophylaxis:   Anti-infectives (From admission, onward)   None     Indication(s): None identified  Post-operative Assessment:  Post-procedure Vital Signs:  Pulse/HCG Rate: 7571 Temp: 98.2 F (36.8 C) Resp: 18 BP: (!) 149/66 SpO2: 100 %  EBL: None  Complications: No immediate post-treatment complications observed by team, or reported by patient.  Note: The patient tolerated the entire procedure well. A repeat set of vitals were taken after the procedure and the patient was kept under observation following institutional policy, for this type of procedure. Post-procedural neurological assessment was performed, showing return to baseline, prior to discharge. The patient was provided with post-procedure discharge instructions, including a section on how to identify potential problems. Should any problems arise concerning this procedure, the patient was given instructions to immediately contact us, at any time, without hesitation. In any case, we plan to contact the patient  by telephone for a follow-up status report regarding this interventional procedure.  Comments:  No additional relevant information.  Plan of Care  Orders:  Orders Placed This Encounter  Procedures  . LUMBAR FACET(MEDIAL BRANCH NERVE BLOCK) MBNB    Scheduling Instructions:     Side: Bilateral     Level: L3-4, L4-5, & L5-S1 Facets (L2, L3, L4, L5, & S1 Medial Branch Nerves)     Sedation: With Sedation.     Timeframe: Today    Order Specific Question:   Where will this procedure be performed?    Answer:   [facility]  . SACROILIAC JOINT INJECTION    Scheduling Instructions:     Side: Left-sided     Sedation: With Sedation.     Timeframe: Today    Order Specific Question:   Where will this procedure be performed?    Answer:   [facility]  . DG PAIN CLINIC C-ARM 1-60 MIN NO REPORT    Intraoperative interpretation by procedural physician at [facility].    Standing Status:   Standing    Number of Occurrences:   1    Order Specific Question:   Reason for exam:    Answer:   Assistance in needle guidance and placement for procedures requiring needle placement in or near specific anatomical locations not easily accessible without such assistance.  . Provider attestation of informed consent for procedure/surgical case    I, the ordering provider, attest that I have discussed with the patient the benefits, risks, side effects, alternatives, likelihood of achieving goals and potential problems during recovery for the procedure that I have provided informed consent.    Standing Status:   Standing    Number of Occurrences:   1  . Informed Consent Details: Transcribe to consent form and obtain patient signature    Standing Status:   Standing    Number of Occurrences:   1    Order Specific Question:   Procedure    Answer:   Bilateral Lumbar facet block (medial branch block) under fluoroscopic guidance. (See notes for levels.)    Order Specific Question:   Surgeon     Answer:   Dontay Harm A. Laban EmperorNaveira, MD  Order Specific Question:   Indication/Reason    Answer:   Bilateral low back pain with or without lower extremity pain  . Informed Consent Details: Transcribe to consent form and obtain patient signature    Consent Attestation: I, the ordering provider, attest that I have discussed with the patient the benefits, risks, side-effects, alternatives, likelihood of achieving goals, and potential problems during recovery for the procedure that I have provided informed consent.    Standing Status:   Standing    Number of Occurrences:   1    Order Specific Question:   Procedure    Answer:   Diagnostic, left sided, sacroiliac joint block under fluoroscopic guidance    Order Specific Question:   Surgeon    Answer:   Kalynne Womac A. Laban EmperorNaveira, MD    Order Specific Question:   Indication/Reason    Answer:   Low back pain secondary sleep to sacroiliac joint dysfunction/arthralgia   Chronic Opioid Analgesic:  Hydrocodone/APAP 7.5/325, 1 tab PO q 6 hrs (30 mg/day of hydrocodone) MME/day:30 mg/day.   Medications ordered for procedure: Meds ordered this encounter  Medications  . lidocaine (XYLOCAINE) 2 % (with pres) injection 400 mg  . lactated ringers infusion 1,000 mL  . midazolam (VERSED) 5 MG/5ML injection 1-2 mg    Make sure Flumazenil is available in the pyxis when using this medication. If oversedation occurs, administer 0.2 mg IV over 15 sec. If after 45 sec no response, administer 0.2 mg again over 1 min; may repeat at 1 min intervals; not to exceed 4 doses (1 mg)  . fentaNYL (SUBLIMAZE) injection 25-50 mcg    Make sure Narcan is available in the pyxis when using this medication. In the event of respiratory depression (RR< 8/min): Titrate NARCAN (naloxone) in increments of 0.1 to 0.2 mg IV at 2-3 minute intervals, until desired degree of reversal.  . ropivacaine (PF) 2 mg/mL (0.2%) (NAROPIN) injection 18 mL  . triamcinolone acetonide (KENALOG-40) injection 80 mg   . ropivacaine (PF) 2 mg/mL (0.2%) (NAROPIN) injection 4 mL  . methylPREDNISolone acetate (DEPO-MEDROL) injection 80 mg   Medications administered: We administered lidocaine, lactated ringers, midazolam, fentaNYL, ropivacaine (PF) 2 mg/mL (0.2%), triamcinolone acetonide, ropivacaine (PF) 2 mg/mL (0.2%), and methylPREDNISolone acetate.  See the medical record for exact dosing, route, and time of administration.  Follow-up plan:   Return for (VV), E/M, (PP).       Considering:   Diagnostic bilateral IA hip joint injection  Possible bilateral hip joint RFA  Possible bilateral lumbar facet RFA  Possible left SI joint RFA  Diagnostic bilateral L4-L5 TFESI  Diagnostic caudal ESI  Diagnostic caudal epidurogram  Possible RACZ epidural lysis of adhesions  Diagnostic left-sided L2-3 LESI  Possible bilateral Lumbar SCS trial implant    Palliative PRN treatment(s):   Diagnostic bilateral lumbar facet block #2  Diagnostic left SI joint block #2     Recent Visits Date Type Provider Dept  05/01/19 Office Visit Delano MetzNaveira, Destaney Sarkis, MD [facility]  Showing recent visits within past 90 days and meeting all other requirements   Today's Visits Date Type Provider Dept  06/27/19 Procedure visit Delano MetzNaveira, Kobie Matkins, MD [facility]  Showing today's visits and meeting all other requirements   Future Appointments Date Type Provider Dept  07/31/19 Appointment Delano MetzNaveira, Amarra Sawyer, MD [facility]  08/07/19 Appointment Delano MetzNaveira, Skylur Fuston, MD [facility]  Showing future appointments within next 90 days and meeting all other requirements   Disposition: Discharge home  Discharge Date &  Time: 06/27/2019; 1130 hrs.   Primary Care Physician: Margaretann Loveless, PA-C Location: [facility] Note by: Oswaldo Done, MD Date: 06/27/2019; Time: 11:41 AM  Disclaimer:  Medicine is not an Visual merchandiser. The only guarantee in medicine is  that nothing is guaranteed. It is important to note that the decision to proceed with this intervention was based on the information collected from the patient. The Data and conclusions were drawn from the patient's questionnaire, the interview, and the physical examination. Because the information was provided in large part by the patient, it cannot be guaranteed that it has not been purposely or unconsciously manipulated. Every effort has been made to obtain as much relevant data as possible for this evaluation. It is important to note that the conclusions that lead to this procedure are derived in large part from the available data. Always take into account that the treatment will also be dependent on availability of resources and existing treatment guidelines, considered by other Pain Management Practitioners as being common knowledge and practice, at the time of the intervention. For Medico-Legal purposes, it is also important to point out that variation in procedural techniques and pharmacological choices are the acceptable norm. The indications, contraindications, technique, and results of the above procedure should only be interpreted and judged by a Board-Certified Interventional Pain Specialist with extensive familiarity and expertise in the same exact procedure and technique.

## 2019-06-28 ENCOUNTER — Telehealth: Payer: Self-pay

## 2019-06-28 NOTE — Telephone Encounter (Signed)
Post procedure phone call.  Patient states she is doing well.  

## 2019-07-02 ENCOUNTER — Ambulatory Visit (INDEPENDENT_AMBULATORY_CARE_PROVIDER_SITE_OTHER): Payer: Medicare Other

## 2019-07-02 ENCOUNTER — Other Ambulatory Visit: Payer: Self-pay

## 2019-07-02 ENCOUNTER — Ambulatory Visit (INDEPENDENT_AMBULATORY_CARE_PROVIDER_SITE_OTHER): Payer: Medicare Other | Admitting: Vascular Surgery

## 2019-07-02 ENCOUNTER — Encounter (INDEPENDENT_AMBULATORY_CARE_PROVIDER_SITE_OTHER): Payer: Self-pay | Admitting: Vascular Surgery

## 2019-07-02 VITALS — BP 140/84 | HR 69 | Resp 16 | Ht 59.0 in | Wt 92.8 lb

## 2019-07-02 DIAGNOSIS — M5441 Lumbago with sciatica, right side: Secondary | ICD-10-CM

## 2019-07-02 DIAGNOSIS — G8929 Other chronic pain: Secondary | ICD-10-CM | POA: Diagnosis not present

## 2019-07-02 DIAGNOSIS — M79605 Pain in left leg: Secondary | ICD-10-CM | POA: Diagnosis not present

## 2019-07-02 DIAGNOSIS — M79604 Pain in right leg: Secondary | ICD-10-CM

## 2019-07-02 DIAGNOSIS — I701 Atherosclerosis of renal artery: Secondary | ICD-10-CM

## 2019-07-02 DIAGNOSIS — E7800 Pure hypercholesterolemia, unspecified: Secondary | ICD-10-CM | POA: Diagnosis not present

## 2019-07-02 DIAGNOSIS — I10 Essential (primary) hypertension: Secondary | ICD-10-CM

## 2019-07-02 DIAGNOSIS — M5442 Lumbago with sciatica, left side: Secondary | ICD-10-CM

## 2019-07-02 NOTE — Progress Notes (Signed)
MRN : [id number]  Suzanne Avila is a 73 y.o. (July 14, 1946) female who presents with chief complaint of  Chief Complaint  Patient presents with  . Follow-up    ultrasound follow up  .  History of Present Illness: Patient returns today in follow up of her renal artery stenosis.  She says that her blood pressure has been reasonably well controlled and as far she knows her renal function is stable and okay.  She had an ulcer on her right foot which was slow to heal but finally did.  She has previously had assessment for PAD without significant disease seen.  Her renal artery duplex today shows an atrophic right kidney of only about 6 cm.  There is a questionable occlusion versus high-grade stenosis in the right renal artery.  The left renal artery has velocities that would fall in the 1 to 59% range with no hemodynamically significant stenosis identified in a stable kidney length of about 10 cm.  Current Outpatient Medications  Medication Sig Dispense Refill  . albuterol (PROVENTIL HFA;VENTOLIN HFA) 108 (90 Base) MCG/ACT inhaler ALBUTEROL, 90MCG/ACT (Inhalation Aerosol Solution)  2 PUFFS Every Day prn 3 Inhaler 3  . aspirin 325 MG tablet Take 1 tablet (325 mg total) by mouth daily. 90 tablet 3  . atorvastatin (LIPITOR) 40 MG tablet Take 1 tablet (40 mg total) by mouth daily. 90 tablet 3  . cholecalciferol (VITAMIN D) 1000 units tablet Take 1 tablet (1,000 Units total) by mouth daily. 90 tablet 3  . diltiazem (CARDIZEM) 120 MG tablet Take 1 tablet (120 mg total) by mouth daily. 90 tablet 3  . dorzolamide (TRUSOPT) 2 % ophthalmic solution Place 1 drop into both eyes 3 (three) times daily. 10 mL 3  . Fluticasone-Salmeterol (ADVAIR DISKUS) 250-50 MCG/DOSE AEPB USE 1 INHALATION TWICE A DAY 180 each 3  . furosemide (LASIX) 20 MG tablet Take 1 tablet (20 mg total) by mouth daily. 90 tablet 3  . HYDROcodone-acetaminophen (NORCO) 7.5-325 MG tablet Take 1 tablet by mouth every 6 (six) hours as needed  for severe pain. Must last 30 days 120 tablet 0  . [START ON 07/13/2019] HYDROcodone-acetaminophen (NORCO) 7.5-325 MG tablet Take 1 tablet by mouth every 6 (six) hours as needed for severe pain. Must last 30 days 120 tablet 0  . latanoprost (XALATAN) 0.005 % ophthalmic solution Place 1 drop into both eyes at bedtime. 2.5 mL 3  . meloxicam (MOBIC) 15 MG tablet Take 1 tablet (15 mg total) by mouth daily. 90 tablet 3  . Multiple Vitamin (MULTI-VITAMIN) tablet Take by mouth.    Marland Kitchen. omeprazole (PRILOSEC) 40 MG capsule Take 1 capsule (40 mg total) by mouth daily. 90 capsule 3  . potassium chloride (KLOR-CON) 20 MEQ packet Take 20 mEq by mouth daily. 90 tablet 3  . pregabalin (LYRICA) 150 MG capsule Take 1 capsule (150 mg total) by mouth every 8 (eight) hours. 270 capsule 0  . ramipril (ALTACE) 10 MG capsule Take 1 capsule (10 mg total) by mouth daily. 90 capsule 3  . rOPINIRole (REQUIP) 2 MG tablet Take 1 tablet (2 mg total) by mouth at bedtime. 90 tablet 1  . sertraline (ZOLOFT) 50 MG tablet Take 1 tablet (50 mg total) by mouth daily. 90 tablet 3  . spironolactone (ALDACTONE) 25 MG tablet Take 1 tablet (25 mg total) by mouth daily. 90 tablet 3  . triamcinolone cream (KENALOG) 0.1 % Apply 1 application topically 2 (two) times daily. 30 g 0  .  vitamin B-12 (CYANOCOBALAMIN) 500 MCG tablet Take 1 tablet (500 mcg total) by mouth daily. 90 tablet 3  . HYDROcodone-acetaminophen (NORCO) 7.5-325 MG tablet Take 1 tablet by mouth every 6 (six) hours as needed for severe pain. Must last 30 days 120 tablet 0   No current facility-administered medications for this visit.     Past Medical History:  Diagnosis Date  . Glaucoma   . Hyperlipidemia   . Hypertension     Past Surgical History:  Procedure Laterality Date  . BACK SURGERY    . CARPAL TUNNEL RELEASE Left   . EYE SURGERY      Social History Social History   Tobacco Use  . Smoking status: Current Every Day Smoker    Packs/day: 1.00    Years:  50.00    Pack years: 50.00    Types: Cigarettes  . Smokeless tobacco: Never Used  Substance Use Topics  . Alcohol use: Yes    Alcohol/week: 35.0 standard drinks    Types: 35 Cans of beer per week  . Drug use: No    Family History Family History  Problem Relation Age of Onset  . Cancer Mother   . Cancer Father   No bleeding or clotting disorders  Allergies  Allergen Reactions  . Codeine Itching  . Cyclobenzaprine Itching  . Penicillins     itching    REVIEW OF SYSTEMS(Negative unless checked)  Constitutional: [] ?Weight loss[] ?Fever[] ?Chills Cardiac:[] ?Chest pain[] ?Chest pressure[] ?Palpitations [] ?Shortness of breath when laying flat [] ?Shortness of breath at rest [] ?Shortness of breath with exertion. Vascular: [x] ?Pain in legs with walking[] ?Pain in legsat rest[] ?Pain in legs when laying flat [x] ?Claudication [] ?Pain in feet when walking [] ?Pain in feet at rest [] ?Pain in feet when laying flat [] ?History of DVT [] ?Phlebitis [] ?Swelling in legs [] ?Varicose veins [] ?Non-healing ulcers Pulmonary: [] ?Uses home oxygen [] ?Productive cough[] ?Hemoptysis [] ?Wheeze [] ?COPD [] ?Asthma Neurologic: [] ?Dizziness [] ?Blackouts [] ?Seizures [] ?History of stroke [] ?History of TIA[] ?Aphasia [] ?Temporary blindness[] ?Dysphagia [] ?Weaknessor numbness in arms [] ?Weakness or numbnessin legs Musculoskeletal: [x] ?Arthritis [] ?Joint swelling [x] ?Joint pain [] ?Low back pain Hematologic:[] ?Easy bruising[] ?Easy bleeding [] ?Hypercoagulable state [] ?Anemic [] ?Hepatitis Gastrointestinal:[] ?Blood in stool[] ?Vomiting blood[] ?Gastroesophageal reflux/heartburn[] ?Abdominal pain Genitourinary: [x] ?Chronic kidney disease [] ?Difficulturination [] ?Frequenturination [] ?Burning with urination[] ?Hematuria Skin: [] ?Rashes [] ?Ulcers [] ?Wounds Psychological: [] ?History of anxiety[] ?History of major  depression.   Physical Examination  BP 140/84 (BP Location: Right Arm)   Pulse 69   Resp 16   Ht 4\' 11"  (1.499 m)   Wt 92 lb 12.8 oz (42.1 kg)   BMI 18.74 kg/m  Gen: Small, NAD.  Heavy odor of tobacco Head: [place]/AT, No temporalis wasting. Ear/Nose/Throat: Hearing grossly intact, nares w/o erythema or drainage Eyes: Conjunctiva clear. Sclera non-icteric Neck: Supple.  Trachea midline Pulmonary:  Good air movement, no use of accessory muscles.  Cardiac: RRR, no JVD Vascular:  Vessel Right Left  Radial Palpable Palpable                          PT  1+ palpable  1+ palpable  DP  1+ palpable  1+ palpable   Gastrointestinal: soft, non-tender/non-distended. No guarding/reflex.  Musculoskeletal: M/S 5/5 throughout.  No deformity or atrophy.  No edema. Neurologic: Sensation grossly intact in extremities.  Symmetrical.  Speech is fluent.  Psychiatric: Judgment intact, Mood & affect appropriate for pt's clinical situation. Dermatologic: No rashes or ulcers noted.  No cellulitis or open wounds.       Labs No results found for this or any previous visit (from the past 2160 hour(s)).  [facility] C-arm 1-60  Min No Report  Result Date: 06/27/2019 Fluoro was used, but no Radiologist interpretation will be provided. Please refer to "NOTES" tab for provider progress note.   Assessment/Plan Chronic low back pain (Location of Secondary source of pain) (Bilateral) (L>R) This could be causing her lower extremity symptoms. Already getting treatment for this.  No PAD was seen on noninvasive studies  Hypercholesterolemia without hypertriglyceridemia lipid control important in reducing the progression of atherosclerotic disease. Continue statin therapy   Pain in limb The patient had lower extremity arterial studies which showed no evidence of significant PAD  Benign essential HTN May have a renovascular component as well.  Blood pressure seems well controlled.   Continue current medical regimen  Renal artery stenosis (HCC) Her renal artery duplex today shows an atrophic right kidney of only about 6 cm.  There is a questionable occlusion versus high-grade stenosis in the right renal artery.  The left renal artery has velocities that would fall in the 1 to 59% range with no hemodynamically significant stenosis identified in a stable kidney length of about 10 cm. Clinically doing well.  No role for intervention with an atrophic kidney and well-controlled blood pressure.  Would recommend annual follow-up with duplex at this point.    Leotis Pain, MD  07/02/2019 1:20 PM    This note was created with Dragon medical transcription system.  Any errors from dictation are purely unintentional

## 2019-07-02 NOTE — Assessment & Plan Note (Signed)
Her renal artery duplex today shows an atrophic right kidney of only about 6 cm.  There is a questionable occlusion versus high-grade stenosis in the right renal artery.  The left renal artery has velocities that would fall in the 1 to 59% range with no hemodynamically significant stenosis identified in a stable kidney length of about 10 cm. Clinically doing well.  No role for intervention with an atrophic kidney and well-controlled blood pressure.  Would recommend annual follow-up with duplex at this point.

## 2019-07-09 ENCOUNTER — Telehealth: Payer: Self-pay | Admitting: *Deleted

## 2019-07-09 DIAGNOSIS — Z122 Encounter for screening for malignant neoplasm of respiratory organs: Secondary | ICD-10-CM

## 2019-07-09 DIAGNOSIS — Z87891 Personal history of nicotine dependence: Secondary | ICD-10-CM

## 2019-07-09 NOTE — Progress Notes (Signed)
Patient: Suzanne Avila Female    DOB: 05-02-1946   73 y.o.   MRN: [id number] Visit Date: 07/10/2019  Today's Provider: Margaretann Loveless, PA-C   Chief Complaint  Patient presents with  . Follow-up   Subjective:     HPI   Hypertension, follow-up:  BP Readings from Last 3 Encounters:  07/10/19 135/77  07/02/19 140/84  06/27/19 (!) 149/66    She was last seen for hypertension 3 months ago.  BP at that visit was 154/71. Management since that visit includes no changes.She reports excellent compliance with treatment. She is not having side effects.  She is not exercising. She is adherent to low salt diet.   Outside blood pressures are not being checked. She is experiencing none.  Patient denies chest pain and exertional chest pressure/discomfort.   Cardiovascular risk factors include advanced age (older than 53 for men, 9 for women), dyslipidemia, family history of premature cardiovascular disease, hypertension and smoking/ tobacco exposure.  Use of agents associated with hypertension: none.   -----------------------------------------------------------  Gastroesophageal reflux disease without esophagitis From 04/08/2019-no changes.  RLS (restless legs syndrome) From 04/08/2019-Stable. No changes.  Situational depression From 04/08/2019-Stable. No changes.  Venous insufficiency From 04/08/2019-Worsening. Discussed conservative therapy with furosemide as below. Wearing compression stockings and elevating legs when at rest. Patient had legs crossed in room and advised to avoid sitting that way. May benefit from venous duplex to evaluate for reflux if symptoms persists. Also discussed importance of smoking cessation. F/U in 2-3 months.     Allergies  Allergen Reactions  . Codeine Itching  . Cyclobenzaprine Itching  . Penicillins     itching     Current Outpatient Medications:  .  albuterol (PROVENTIL HFA;VENTOLIN HFA) 108 (90 Base) MCG/ACT inhaler,  ALBUTEROL, 90MCG/ACT (Inhalation Aerosol Solution)  2 PUFFS Every Day prn, Disp: 3 Inhaler, Rfl: 3 .  aspirin 325 MG tablet, Take 1 tablet (325 mg total) by mouth daily., Disp: 90 tablet, Rfl: 3 .  atorvastatin (LIPITOR) 40 MG tablet, Take 1 tablet (40 mg total) by mouth daily., Disp: 90 tablet, Rfl: 3 .  cholecalciferol (VITAMIN D) 1000 units tablet, Take 1 tablet (1,000 Units total) by mouth daily., Disp: 90 tablet, Rfl: 3 .  diltiazem (CARDIZEM) 120 MG tablet, Take 1 tablet (120 mg total) by mouth daily., Disp: 90 tablet, Rfl: 3 .  dorzolamide (TRUSOPT) 2 % ophthalmic solution, Place 1 drop into both eyes 3 (three) times daily., Disp: 10 mL, Rfl: 3 .  Fluticasone-Salmeterol (ADVAIR DISKUS) 250-50 MCG/DOSE AEPB, USE 1 INHALATION TWICE A DAY, Disp: 180 each, Rfl: 3 .  furosemide (LASIX) 20 MG tablet, Take 1 tablet (20 mg total) by mouth daily., Disp: 90 tablet, Rfl: 3 .  [START ON 07/13/2019] HYDROcodone-acetaminophen (NORCO) 7.5-325 MG tablet, Take 1 tablet by mouth every 6 (six) hours as needed for severe pain. Must last 30 days, Disp: 120 tablet, Rfl: 0 .  latanoprost (XALATAN) 0.005 % ophthalmic solution, Place 1 drop into both eyes at bedtime., Disp: 2.5 mL, Rfl: 3 .  Multiple Vitamin (MULTI-VITAMIN) tablet, Take by mouth., Disp: , Rfl:  .  omeprazole (PRILOSEC) 40 MG capsule, Take 1 capsule (40 mg total) by mouth daily., Disp: 90 capsule, Rfl: 3 .  potassium chloride (KLOR-CON) 20 MEQ packet, Take 20 mEq by mouth daily., Disp: 90 tablet, Rfl: 3 .  pregabalin (LYRICA) 150 MG capsule, Take 1 capsule (150 mg total) by mouth every 8 (eight) hours., Disp: 270 capsule,  Rfl: 0 .  ramipril (ALTACE) 10 MG capsule, Take 1 capsule (10 mg total) by mouth daily., Disp: 90 capsule, Rfl: 3 .  rOPINIRole (REQUIP) 2 MG tablet, Take 1 tablet (2 mg total) by mouth at bedtime., Disp: 90 tablet, Rfl: 1 .  sertraline (ZOLOFT) 50 MG tablet, Take 1 tablet (50 mg total) by mouth daily., Disp: 90 tablet, Rfl: 3 .   spironolactone (ALDACTONE) 25 MG tablet, Take 1 tablet (25 mg total) by mouth daily., Disp: 90 tablet, Rfl: 3 .  triamcinolone cream (KENALOG) 0.1 %, Apply 1 application topically 2 (two) times daily., Disp: 30 g, Rfl: 0 .  vitamin B-12 (CYANOCOBALAMIN) 500 MCG tablet, Take 1 tablet (500 mcg total) by mouth daily., Disp: 90 tablet, Rfl: 3 .  HYDROcodone-acetaminophen (NORCO) 7.5-325 MG tablet, Take 1 tablet by mouth every 6 (six) hours as needed for severe pain. Must last 30 days, Disp: 120 tablet, Rfl: 0 .  HYDROcodone-acetaminophen (NORCO) 7.5-325 MG tablet, Take 1 tablet by mouth every 6 (six) hours as needed for severe pain. Must last 30 days (Patient not taking: Reported on 07/10/2019), Disp: 120 tablet, Rfl: 0 .  meloxicam (MOBIC) 15 MG tablet, Take 1 tablet (15 mg total) by mouth daily., Disp: 90 tablet, Rfl: 3  Review of Systems  Constitutional: Negative.   Respiratory: Negative.   Cardiovascular: Negative.   Gastrointestinal: Negative.   Musculoskeletal: Positive for arthralgias (right elbow).  Neurological: Negative.     Social History   Tobacco Use  . Smoking status: Current Every Day Smoker    Packs/day: 1.00    Years: 50.00    Pack years: 50.00    Types: Cigarettes  . Smokeless tobacco: Never Used  Substance Use Topics  . Alcohol use: Yes    Alcohol/week: 35.0 standard drinks    Types: 35 Cans of beer per week      Objective:   BP 135/77 (BP Location: Right Arm, Patient Position: Sitting, Cuff Size: Small)   Pulse 86   Temp (!) 97.3 F (36.3 C) (Other (Comment))   Resp 16   Ht 4\' 11"  (1.499 m)   Wt 94 lb 4.8 oz (42.8 kg)   SpO2 98%   BMI 19.05 kg/m  Vitals:   07/10/19 1043  BP: 135/77  Pulse: 86  Resp: 16  Temp: (!) 97.3 F (36.3 C)  TempSrc: Other (Comment)  SpO2: 98%  Weight: 94 lb 4.8 oz (42.8 kg)  Height: 4\' 11"  (1.499 m)  Body mass index is 19.05 kg/m.   Physical Exam Vitals signs reviewed.  Constitutional:      General: She is not in  acute distress.    Appearance: Normal appearance. She is well-developed. She is not ill-appearing or diaphoretic.  Neck:     Musculoskeletal: Normal range of motion and neck supple.     Thyroid: No thyromegaly.     Vascular: No JVD.     Trachea: No tracheal deviation.  Cardiovascular:     Rate and Rhythm: Normal rate and regular rhythm.     Heart sounds: Normal heart sounds. No murmur. No friction rub. No gallop.   Pulmonary:     Effort: Pulmonary effort is normal. No respiratory distress.     Breath sounds: Normal breath sounds. No wheezing or rales.  Musculoskeletal:     Right elbow: She exhibits swelling. She exhibits normal range of motion. No tenderness found.     Right lower leg: No edema.     Left lower leg: No edema.  Lymphadenopathy:     Cervical: No cervical adenopathy.  Neurological:     Mental Status: She is alert.      No results found for any visits on 07/10/19.     Assessment & Plan    1. Swelling of right elbow Swelling and mild redness over the ulna at the elbow. She states she leans on it when she is doing puzzles. Suspect bursitis but will image to r/o bony abnormality. - DG Elbow Complete Right; Future  2. Renal artery stenosis (HCC) Stable. Diagnosis pulled for medication refill. Continue current medical treatment plan. - atorvastatin (LIPITOR) 40 MG tablet; Take 1 tablet (40 mg total) by mouth daily.  Dispense: 90 tablet; Refill: 3  3. Pure hypercholesterolemia Stable. Diagnosis pulled for medication refill. Continue current medical treatment plan. - atorvastatin (LIPITOR) 40 MG tablet; Take 1 tablet (40 mg total) by mouth daily.  Dispense: 90 tablet; Refill: 3  4. Essential hypertension I will see her back in 3 months to recheck. BP is better today. Continue current medical treatment plan. - diltiazem (CARDIZEM) 120 MG tablet; Take 1 tablet (120 mg total) by mouth daily.  Dispense: 90 tablet; Refill: 3 - spironolactone (ALDACTONE) 25 MG tablet; Take  1 tablet (25 mg total) by mouth daily.  Dispense: 90 tablet; Refill: 3  5. Venous insufficiency Stable. Diagnosis pulled for medication refill. Continue current medical treatment plan. - furosemide (LASIX) 20 MG tablet; Take 1 tablet (20 mg total) by mouth daily.  Dispense: 90 tablet; Refill: [place], PA-C  [employer]

## 2019-07-09 NOTE — Telephone Encounter (Signed)
Patient has been notified that lung cancer screening CT scan is due currently or will be in near future. Confirmed that patient is within the appropriate age range, and asymptomatic, (no signs or symptoms of lung cancer). Patient denies illness that would prevent curative treatment for lung cancer if found. Verified smoking history ( Current Smoker 1ppd). Patient is agreeable for CT scan being scheduled and prefers a late morning appointment.

## 2019-07-10 ENCOUNTER — Ambulatory Visit (INDEPENDENT_AMBULATORY_CARE_PROVIDER_SITE_OTHER): Payer: Medicare Other | Admitting: Physician Assistant

## 2019-07-10 ENCOUNTER — Other Ambulatory Visit: Payer: Self-pay

## 2019-07-10 ENCOUNTER — Encounter: Payer: Self-pay | Admitting: Physician Assistant

## 2019-07-10 VITALS — BP 135/77 | HR 86 | Temp 97.3°F | Resp 16 | Ht 59.0 in | Wt 94.3 lb

## 2019-07-10 DIAGNOSIS — I701 Atherosclerosis of renal artery: Secondary | ICD-10-CM | POA: Diagnosis not present

## 2019-07-10 DIAGNOSIS — E7800 Pure hypercholesterolemia, unspecified: Secondary | ICD-10-CM | POA: Diagnosis not present

## 2019-07-10 DIAGNOSIS — M25421 Effusion, right elbow: Secondary | ICD-10-CM

## 2019-07-10 DIAGNOSIS — I872 Venous insufficiency (chronic) (peripheral): Secondary | ICD-10-CM

## 2019-07-10 DIAGNOSIS — I10 Essential (primary) hypertension: Secondary | ICD-10-CM | POA: Diagnosis not present

## 2019-07-10 MED ORDER — SPIRONOLACTONE 25 MG PO TABS: 25.0000 mg | ORAL_TABLET | Freq: Every day | ORAL | 3 refills | Status: DC

## 2019-07-10 MED ORDER — FUROSEMIDE 20 MG PO TABS: 20.0000 mg | ORAL_TABLET | Freq: Every day | ORAL | 3 refills | Status: DC

## 2019-07-10 MED ORDER — ATORVASTATIN CALCIUM 40 MG PO TABS: 40.0000 mg | ORAL_TABLET | Freq: Every day | ORAL | 3 refills | Status: DC

## 2019-07-10 MED ORDER — DILTIAZEM HCL 120 MG PO TABS: 120.0000 mg | ORAL_TABLET | Freq: Every day | ORAL | 3 refills | Status: DC

## 2019-07-10 NOTE — Patient Instructions (Signed)
Stool softener and Miralax (1 capful daily) or dulcolax

## 2019-07-12 NOTE — Telephone Encounter (Signed)
Current smoking, 65.5 pack year hx.

## 2019-07-12 NOTE — Addendum Note (Signed)
Addended by: Lieutenant Diego on: 07/12/2019 02:47 PM   Modules accepted: Orders

## 2019-07-19 ENCOUNTER — Other Ambulatory Visit: Payer: Self-pay

## 2019-07-19 ENCOUNTER — Ambulatory Visit
Admission: RE | Admit: 2019-07-19 | Discharge: 2019-07-19 | Disposition: A | Discharge status: Home / Self Care | Payer: Medicare Other | Source: Ambulatory Visit | Attending: Oncology | Admitting: Oncology

## 2019-07-19 ENCOUNTER — Telehealth: Payer: Self-pay

## 2019-07-19 ENCOUNTER — Ambulatory Visit
Admission: RE | Admit: 2019-07-19 | Discharge: 2019-07-19 | Disposition: A | Discharge status: Home / Self Care | Payer: Medicare Other | Source: Ambulatory Visit | Attending: Physician Assistant | Admitting: Physician Assistant

## 2019-07-19 DIAGNOSIS — M25421 Effusion, right elbow: Secondary | ICD-10-CM | POA: Insufficient documentation

## 2019-07-19 DIAGNOSIS — Z87891 Personal history of nicotine dependence: Secondary | ICD-10-CM | POA: Diagnosis not present

## 2019-07-19 DIAGNOSIS — Z122 Encounter for screening for malignant neoplasm of respiratory organs: Secondary | ICD-10-CM | POA: Insufficient documentation

## 2019-07-19 DIAGNOSIS — M7989 Other specified soft tissue disorders: Secondary | ICD-10-CM | POA: Diagnosis not present

## 2019-07-19 NOTE — Telephone Encounter (Signed)
Yes I had and signed last week sometime. It should have been faxed back already.  Sharyn Lull do you happen to have this? I looked through [place] and did not see it.

## 2019-07-19 NOTE — Telephone Encounter (Signed)
Returned call to [place] at [facility]. Advised that we did receive form 07/08/2019 and refaxed it 07/09/2019. Also refaxed form today, since it was nor received.

## 2019-07-19 NOTE — Telephone Encounter (Signed)
Please advise 

## 2019-07-19 NOTE — Telephone Encounter (Signed)
Olivia Mackie @ [facility] was calling to find out if you received a certificate of medical necessity. Please call back at [phone].

## 2019-07-24 ENCOUNTER — Encounter: Payer: Self-pay | Admitting: *Deleted

## 2019-07-30 ENCOUNTER — Encounter: Payer: Self-pay | Admitting: Pain Medicine

## 2019-07-31 ENCOUNTER — Ambulatory Visit: Payer: Medicare Other | Admitting: Pain Medicine

## 2019-07-31 ENCOUNTER — Other Ambulatory Visit: Payer: Self-pay

## 2019-07-31 NOTE — Progress Notes (Signed)
Pain Management Virtual Encounter Note - Virtual Visit via Telephone Telehealth (real-time audio visits between healthcare provider and patient).   Patient's Phone No. & Preferred Pharmacy:  [phone] (home); [phone] (mobile); (Preferred) [phone] No e-mail address on record  [id number] - [place], [place] - [address] [facility] [address] [place] [address] Phone: [phone] Fax: [phone]    Pre-screening note:  Our staff contacted Suzanne Avila and offered her an "in person", "face-to-face" appointment versus a telephone encounter. She indicated preferring the telephone encounter, at this time.   Reason for Virtual Visit: COVID-19*  Social distancing based on CDC and AMA recommendations.   I contacted Suzanne HoopsJanet M Avila on 08/01/2019 via telephone.      I clearly identified myself as Oswaldo DoneFrancisco A Axell Trigueros, MD. I verified that I was speaking with the correct person using two identifiers (Name: Suzanne Avila, and date of birth: [birth year]).  Advanced Informed Consent I sought verbal advanced consent from Suzanne Avila for virtual visit interactions. I informed Suzanne Avila of possible security and privacy concerns, risks, and limitations associated with providing "not-in-person" medical evaluation and management services. I also informed Suzanne Avila of the availability of "in-person" appointments. Finally, I informed her that there would be a charge for the virtual visit and that she could be  personally, fully or partially, financially responsible for it. Suzanne Avila expressed understanding and agreed to proceed.   Historic Elements   Suzanne Avila is a 73 y.o. year old, female patient evaluated today after her last encounter by our practice on 06/28/2019. Suzanne Avila  has a past medical history of Glaucoma, Hyperlipidemia, and Hypertension. She also  has a past surgical history that  includes Back surgery; Eye surgery; and Carpal tunnel release (Left). Suzanne Avila has a current medication list which includes the following prescription(s): albuterol, aspirin, atorvastatin, cholecalciferol, diltiazem, dorzolamide, fluticasone-salmeterol, furosemide, hydrocodone-acetaminophen, hydrocodone-acetaminophen, hydrocodone-acetaminophen, latanoprost, lubiprostone, meloxicam, multi-vitamin, omeprazole, potassium chloride, pregabalin, ramipril, ropinirole, sertraline, spironolactone, triamcinolone cream, and vitamin b-12. She  reports that she has been smoking cigarettes. She has a 50.00 pack-year smoking history. She has never used smokeless tobacco. She reports current alcohol use of about 35.0 standard drinks of alcohol per week. She reports that she does not use drugs. Suzanne Avila is allergic to codeine; cyclobenzaprine; and penicillins.   HPI  Today, she is being contacted for both, medication management and a post-procedure assessment.  The patient indicates having had complete relief of the pain and numbness in the injected area until the next day.  This would confirm that the injected area was the source of the pain.  24 hours after the procedure, she began to experience some return of the pain but she continues to have a 50% ongoing improvement compared to prior to the procedure.  She indicates that she is no longer having any other sharp pains and she is considering having a second shot.  With regards to the medications, she refers doing well except for the fact that she has been experiencing considerable constipation.  This is likely to be secondary to the opioids and therefore we will go ahead and provide her with some medication to treat this.  Post-Procedure Evaluation  Procedure: Diagnostic bilateral lumbar facet block #1 + left-sided sacroiliac joint block #1 under fluoroscopic guidance and IV sedation Pre-procedure pain level:  7/10 Post-procedure: 0/10 (100% relief)  Sedation:  Sedation provided.  Effectiveness during initial hour after procedure(Ultra-Short  Term Relief): 100 %   Local anesthetic used: Long-acting (4-6 hours) Effectiveness: Defined as any analgesic benefit obtained secondary to the administration of local anesthetics. This carries significant diagnostic value as to the etiological location, or anatomical origin, of the pain. Duration of benefit is expected to coincide with the duration of the local anesthetic used.  Effectiveness during initial 4-6 hours after procedure(Short-Term Relief): 100 %   Long-term benefit: Defined as any relief past the pharmacologic duration of the local anesthetics.  Effectiveness past the initial 6 hours after procedure(Long-Term Relief): 50 % (Ongoing)  Current benefits: Defined as benefit that persist at this time.   Analgesia:  50% improved Function: Suzanne Avila reports improvement in function ROM: Suzanne Avila reports improvement in ROM  Pharmacotherapy Assessment  Analgesic: Hydrocodone/APAP 7.5/325, 1 tab PO q 6 hrs (30 mg/day of hydrocodone) MME/day:30 mg/day.   Monitoring: Pharmacotherapy: No side-effects or adverse reactions reported. [place] PMP: PDMP reviewed during this encounter.       Compliance: No problems identified. Effectiveness: Clinically acceptable. Plan: Refer to "POC".  UDS:  Summary  Date Value Ref Range Status  08/02/2018 FINAL  Final    Comment:    ==================================================================== TOXASSURE SELECT 13 (MW) ==================================================================== Test                             Result       Flag       Units Drug Present and Declared for Prescription Verification   Hydrocodone                    3379         EXPECTED   ng/mg creat   Hydromorphone                  1755         EXPECTED   ng/mg creat   Dihydrocodeine                 230          EXPECTED   ng/mg creat   Norhydrocodone                 1521          EXPECTED   ng/mg creat    Sources of hydrocodone include scheduled prescription    medications. Hydromorphone, dihydrocodeine and norhydrocodone are    expected metabolites of hydrocodone. Hydromorphone and    dihydrocodeine are also available as scheduled prescription    medications. ==================================================================== Test                      Result    Flag   Units      Ref Range   Creatinine              33               mg/dL      >=22 ==================================================================== Declared Medications:  The flagging and interpretation on this report are based on the  following declared medications.  Unexpected results may arise from  inaccuracies in the declared medications.  **Note: The testing scope of this panel includes these medications:  Hydrocodone (Norco)  **Note: The testing scope of this panel does not include following  reported medications:  Acetaminophen (Norco)  Albuterol  Aspirin  Atorvastatin (Lipitor)  Cyanocobalamin  Diltiazem (Cardizem)  Dorzolamide  Fluticasone (Advair)  Latanoprost (Xalatan)  Meloxicam (  Mobic)  Omeprazole (Prilosec)  Potassium (Klor-Con)  Pregabalin (Lyrica)  Ramipril (Altace)  Ropinirole (Requip)  Salmeterol (Advair)  Spironolactone (Aldactone)  Vitamin D ==================================================================== For clinical consultation, please call [phone]. ====================================================================    Laboratory Chemistry Profile (12 mo)  Renal: 01/09/2019: BUN 7; BUN/Creatinine Ratio 9; Creatinine, Ser 0.77  Lab Results  Component Value Date   GFRAA 89 01/09/2019   GFRNONAA 77 01/09/2019   Hepatic: 01/09/2019: Albumin 4.1 Lab Results  Component Value Date   AST 21 01/09/2019   ALT 12 01/09/2019   Other: No results found for requested labs within last 8760 hours. Note: Above Lab results reviewed.  Imaging  Last 90  days:  Dg Elbow Complete Right  Result Date: 07/19/2019 CLINICAL DATA:  Swelling and tenderness of right elbow on posterior aspect. Tenderness at ulna per physician notes. Patient states no known injury. EXAM: RIGHT ELBOW - COMPLETE 3+ VIEW COMPARISON:  None. FINDINGS: Soft tissue swelling is noted posterior to the proximal ulna. No radiopaque foreign body. No soft tissue air. No fracture or bone lesion. Small enthesophytes from the origins of the common extensor and common flexor tendons. Elbow joint normally spaced and aligned.  No joint effusion. IMPRESSION: 1. No fracture, bone lesion or significant elbow joint abnormality. 2. Posterior soft tissue swelling, posterior to the proximal ulna. No underlying bone abnormality. No radiopaque foreign body or soft tissue air. Electronically Signed   By: Lajean Manes M.D.   On: 07/19/2019 15:04   Dg Pain Clinic C-arm 1-60 Min No Report  Result Date: 06/27/2019 Fluoro was used, but no Radiologist interpretation will be provided. Please refer to "NOTES" tab for provider progress note.  Ct Chest Lung Cancer Screening Low Dose Wo Contrast  Result Date: 07/19/2019 CLINICAL DATA:  73 year old female with 66 pack-year history of smoking. Lung cancer screening. EXAM: CT CHEST WITHOUT CONTRAST LOW-DOSE FOR LUNG CANCER SCREENING TECHNIQUE: Multidetector CT imaging of the chest was performed following the standard protocol without IV contrast. COMPARISON:  07/09/2018 FINDINGS: Cardiovascular: The heart size is normal. No substantial pericardial effusion. Coronary artery calcification is evident. Atherosclerotic calcification is noted in the wall of the thoracic aorta. Mediastinum/Nodes: No mediastinal lymphadenopathy. No evidence for gross hilar lymphadenopathy although assessment is limited by the lack of intravenous contrast on today's study. The esophagus has normal imaging features. There is no axillary lymphadenopathy. Lungs/Pleura: Centrilobular emphsyema noted.  Previously identified pulmonary nodule is stable. No new suspicious pulmonary nodule or mass. No focal airspace consolidation. No pleural effusion. Upper Abdomen: No focal abnormality in the liver on this study without intravenous contrast. Right kidney is markedly atrophic. There is abdominal aortic atherosclerosis without aneurysm. Musculoskeletal: No worrisome lytic or sclerotic osseous abnormality. Old posterior left rib fractures again noted. IMPRESSION: 1. Lung-RADS 2, benign appearance or behavior. Continue annual screening with low-dose chest CT without contrast in 12 months. 2.  Aortic Atherosclerois (ICD10-170.0) 3.  Emphysema. (BHA19-F79.9) Electronically Signed   By: Misty Stanley M.D.   On: 07/19/2019 15:52   Vas US Renal Artery Duplex  Result Date: 07/02/2019 ABDOMINAL VISCERAL Limitations: Patient has a cough and difficulty holding her breath. Left hip pain with difficulty lying on her left side. Comparison Study: 06/29/2018 Performing Technologist: Charlane Ferretti RT (R)(VS) Supporting Technologist: Blondell Reveal RT, RDMS, RVT  Examination Guidelines: A complete evaluation includes B-mode imaging, spectral Doppler, color Doppler, and power Doppler as needed of all accessible portions of each vessel. Bilateral testing is considered an integral part of a complete examination. Limited examinations  for reoccurring indications may be performed as noted.  Duplex Findings: +----------+--------+--------+------+--------+ MesentericPSV cm/sEDV cm/sPlaqueComments +----------+--------+--------+------+--------+ Aorta Mid    72                          +----------+--------+--------+------+--------+  +------------------+-------+--------+------------------------------------------+ Right Renal Artery  PSV  EDV cm/s                 Comment                                      cm/s                                                      +------------------+-------+--------+------------------------------------------+ Proximal            191             Possible accessory versus collateral                                                      vessels                   +------------------+-------+--------+------------------------------------------+ Mid                 100                                                     +------------------+-------+--------+------------------------------------------+ Distal              34                                                      +------------------+-------+--------+------------------------------------------+ +-----------------+--------+--------+-------+ Left Renal ArteryPSV cm/sEDV cm/sComment +-----------------+--------+--------+-------+ Proximal           167                   +-----------------+--------+--------+-------+ Mid                167                   +-----------------+--------+--------+-------+ Distal             170                   +-----------------+--------+--------+-------+  +------------------+----+------------------+----+ Right Kidney          Left Kidney            +------------------+----+------------------+----+ RAR (manual)      2.65RAR (manual)      2.36 +------------------+----+------------------+----+ Kidney length (cm)6.10Kidney length (cm)9.90 +------------------+----+------------------+----+ Summary: Renal:  Right: RRV flow present. Abnormal size for the right kidney. Small        right kidney with no significant stenosis in the right kidney  with possible collaterals in the right renal artery region.        Cannot rule out possible renal artery occlusion. Limited        visual flow to the right kidney. Left:  LRV flow present. Normal size of left kidney. 1-59% stenosis        of the left renal artery.  *See table(s) above for measurements and observations.  Diagnosing physician: Festus Barren MD  Electronically  signed by Festus Barren MD on 07/02/2019 at 3:02:14 PM.    Final     Assessment  The primary encounter diagnosis was Chronic pain syndrome. 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  Plan of Care  I am having Suzanne Avila start on lubiprostone. I am also having her maintain her cholecalciferol, albuterol, aspirin, dorzolamide, Fluticasone-Salmeterol, latanoprost, vitamin B-12, triamcinolone cream, meloxicam, omeprazole, potassium chloride, ramipril, rOPINIRole, sertraline, Multi-Vitamin, atorvastatin, diltiazem, furosemide, spironolactone, HYDROcodone-acetaminophen, HYDROcodone-acetaminophen, HYDROcodone-acetaminophen, and pregabalin.  Pharmacotherapy (Medications Ordered): Meds ordered this encounter  Medications  . HYDROcodone-acetaminophen (NORCO) 7.5-325 MG tablet    Sig: Take 1 tablet by mouth every 6 (six) hours as needed for severe pain. Must last 30 days    Dispense:  120 tablet    Refill:  0    Chronic Pain: STOP Act (Not applicable) Fill 1 day early if closed on refill date. Do not fill until: 08/12/2019. To last until: 09/11/2019. Avoid benzodiazepines within 8 hours of opioids  . HYDROcodone-acetaminophen (NORCO) 7.5-325 MG tablet    Sig: Take 1 tablet by mouth every 6 (six) hours as needed for severe pain. Must last 30 days    Dispense:  120 tablet    Refill:  0    Chronic Pain: STOP Act (Not applicable) Fill 1 day early if closed on refill date. Do not fill until: 09/11/2019. To last until: 10/11/2019. Avoid benzodiazepines within 8 hours of opioids  . HYDROcodone-acetaminophen (NORCO) 7.5-325 MG tablet     Sig: Take 1 tablet by mouth every 6 (six) hours as needed for severe pain. Must last 30 days    Dispense:  120 tablet    Refill:  0    Chronic Pain: STOP Act (Not applicable) Fill 1 day early if closed on refill date. Do not fill until: 10/11/2019. To last until: 11/10/2019. Avoid benzodiazepines within 8 hours of opioids  . pregabalin (LYRICA) 150 MG capsule    Sig: Take 1 capsule (150 mg total) by mouth every 8 (eight) hours.    Dispense:  270 capsule    Refill:  1    Fill one day early if pharmacy is closed on scheduled refill date. May substitute for generic if available.  . lubiprostone (AMITIZA) 8 MCG capsule    Sig: Take 1 capsule (8 mcg total) by mouth 2 (two) times daily with a meal. Swallow the medication whole. Do not break or chew the medication.    Dispense:  60 capsule    Refill:  3    Fill one day early if pharmacy is closed on scheduled refill date. May substitute for generic if available.   Orders:  No orders of the defined types were placed in this encounter.  Follow-up plan:   Return  in about 3 months (around 11/06/2019) for (VV), (MM).     Considering:   Diagnostic bilateral IA hip joint injection  Possible bilateral hip joint RFA  Possible bilateral lumbar facet RFA  Possible left SI joint RFA  Diagnostic bilateral L4-L5 TFESI  Diagnostic caudal ESI  Diagnostic caudal epidurogram  Possible RACZ epidural lysis of adhesions  Diagnostic left L2-3 LESI  Possible bilateral Lumbar SCS trial implant    Palliative PRN treatment(s):   Diagnostic bilateral lumbar facet block #2  Diagnostic left SI joint block #2     Recent Visits Date Type Provider Dept  06/27/19 Procedure visit Delano Metz, MD [facility]  Showing recent visits within past 90 days and meeting all other requirements   Today's Visits Date Type Provider Dept  08/01/19 Office Visit Delano Metz, MD [facility]  Showing today's visits and meeting all other  requirements   Future Appointments Date Type Provider Dept  08/07/19 Appointment Delano Metz, MD [facility]  Showing future appointments within next 90 days and meeting all other requirements   I discussed the assessment and treatment plan with the patient. The patient was provided an opportunity to ask questions and all were answered. The patient agreed with the plan and demonstrated an understanding of the instructions.  Patient advised to call back or seek an in-person evaluation if the symptoms or condition worsens.  Total duration of non-face-to-face encounter: 16 minutes.  Note by: Oswaldo Done, MD Date: 08/01/2019; Time: 1:39 PM  Note: This dictation was prepared with Dragon dictation. Any transcriptional errors that may result from this process are unintentional.  Disclaimer:  * Given the special circumstances of the COVID-19 pandemic, the federal government has announced that the Office for Civil Rights (OCR) will exercise its enforcement discretion and will not impose penalties on physicians using telehealth in the event of noncompliance with regulatory requirements under the [employer] Portability and Accountability Act (HIPAA) in connection with the good faith provision of telehealth during the COVID-19 national public health emergency. (AMA)

## 2019-07-31 NOTE — Progress Notes (Signed)
Unsuccessful attempt to contact patient for Virtual Visit (Pain Management Telehealth)   Patient provided contact information:  [phone] (home); [phone] (mobile); (Preferred) [phone] No e-mail address on record   Pre-screening:  Our staff was successful in contacting Suzanne Avila using the above provided information.   I unsuccessfully attempted to make contact with Suzanne Avila on 07/31/2019 via telephone. I was unable to complete the virtual encounter due to call going directly to voicemail. I was able to leave a message where I clearly identify myself as Gaspar Cola, MD and I left a message to call us back to reschedule the call.  Pharmacotherapy Assessment  Analgesic: Hydrocodone/APAP 7.5/325, 1 tab PO q 6 hrs (30 mg/day of hydrocodone) MME/day:30 mg/day.   Monitoring: Pharmacotherapy: No side-effects or adverse reactions reported. [place] PMP: PDMP reviewed during this encounter.       Compliance: No problems identified. Effectiveness: Clinically acceptable. Plan: Refer to "POC".  UDS:  Summary  Date Value Ref Range Status  08/02/2018 FINAL  Final    Comment:    ==================================================================== TOXASSURE SELECT 13 (MW) ==================================================================== Test                             Result       Flag       Units Drug Present and Declared for Prescription Verification   Hydrocodone                    3379         EXPECTED   ng/mg creat   Hydromorphone                  1755         EXPECTED   ng/mg creat   Dihydrocodeine                 230          EXPECTED   ng/mg creat   Norhydrocodone                 1521         EXPECTED   ng/mg creat    Sources of hydrocodone include scheduled prescription    medications. Hydromorphone, dihydrocodeine and norhydrocodone are    expected metabolites of hydrocodone. Hydromorphone and    dihydrocodeine are also available as scheduled  prescription    medications. ==================================================================== Test                      Result    Flag   Units      Ref Range   Creatinine              33               mg/dL      >=20 ==================================================================== Declared Medications:  The flagging and interpretation on this report are based on the  following declared medications.  Unexpected results may arise from  inaccuracies in the declared medications.  **Note: The testing scope of this panel includes these medications:  Hydrocodone (Norco)  **Note: The testing scope of this panel does not include following  reported medications:  Acetaminophen (Norco)  Albuterol  Aspirin  Atorvastatin (Lipitor)  Cyanocobalamin  Diltiazem (Cardizem)  Dorzolamide  Fluticasone (Advair)  Latanoprost (Xalatan)  Meloxicam (Mobic)  Omeprazole (Prilosec)  Potassium (Klor-Con)  Pregabalin (Lyrica)  Ramipril (Altace)  Ropinirole (Requip)  Salmeterol (Advair)  Spironolactone (  Aldactone)  Vitamin D ==================================================================== For clinical consultation, please call [phone]. ====================================================================     Follow-up plan:   No follow-ups on file.      Considering:   Diagnostic bilateral IA hip joint injection  Possible bilateral hip joint RFA  Possible bilateral lumbar facet RFA  Possible left SI joint RFA  Diagnostic bilateral L4-L5 TFESI  Diagnostic caudal ESI  Diagnostic caudal epidurogram  Possible RACZ epidural lysis of adhesions  Diagnostic left-sided L2-3 LESI  Possible bilateral Lumbar SCS trial implant    Palliative PRN treatment(s):   Diagnostic bilateral lumbar facet block #2  Diagnostic left SI joint block #2      Recent Visits Date Type Provider Dept  06/27/19 Procedure visit Delano Metz, MD [facility]  Showing recent visits within past  90 days and meeting all other requirements   Today's Visits Date Type Provider Dept  08/01/19 Appointment Delano Metz, MD [facility]  Showing today's visits and meeting all other requirements   Future Appointments Date Type Provider Dept  08/07/19 Appointment Delano Metz, MD [facility]  Showing future appointments within next 90 days and meeting all other requirements    Note by: Oswaldo Done, MD Date: 07/31/2019; Time: 1:27 PM

## 2019-08-01 ENCOUNTER — Telehealth: Payer: Self-pay

## 2019-08-01 ENCOUNTER — Ambulatory Visit: Payer: Medicare Other | Attending: Pain Medicine | Admitting: Pain Medicine

## 2019-08-01 DIAGNOSIS — M5441 Lumbago with sciatica, right side: Secondary | ICD-10-CM | POA: Diagnosis not present

## 2019-08-01 DIAGNOSIS — M79605 Pain in left leg: Secondary | ICD-10-CM

## 2019-08-01 DIAGNOSIS — M79601 Pain in right arm: Secondary | ICD-10-CM

## 2019-08-01 DIAGNOSIS — K5903 Drug induced constipation: Secondary | ICD-10-CM

## 2019-08-01 DIAGNOSIS — M79602 Pain in left arm: Secondary | ICD-10-CM

## 2019-08-01 DIAGNOSIS — M797 Fibromyalgia: Secondary | ICD-10-CM

## 2019-08-01 DIAGNOSIS — M5442 Lumbago with sciatica, left side: Secondary | ICD-10-CM

## 2019-08-01 DIAGNOSIS — M47816 Spondylosis without myelopathy or radiculopathy, lumbar region: Secondary | ICD-10-CM

## 2019-08-01 DIAGNOSIS — M79604 Pain in right leg: Secondary | ICD-10-CM

## 2019-08-01 DIAGNOSIS — M961 Postlaminectomy syndrome, not elsewhere classified: Secondary | ICD-10-CM

## 2019-08-01 DIAGNOSIS — G894 Chronic pain syndrome: Secondary | ICD-10-CM | POA: Diagnosis not present

## 2019-08-01 DIAGNOSIS — T402X5A Adverse effect of other opioids, initial encounter: Secondary | ICD-10-CM

## 2019-08-01 DIAGNOSIS — G8929 Other chronic pain: Secondary | ICD-10-CM

## 2019-08-01 DIAGNOSIS — M1612 Unilateral primary osteoarthritis, left hip: Secondary | ICD-10-CM

## 2019-08-01 MED ORDER — PREGABALIN 150 MG PO CAPS: 150.0000 mg | ORAL_CAPSULE | Freq: Three times a day (TID) | ORAL | 1 refills | Status: DC

## 2019-08-01 MED ORDER — HYDROCODONE-ACETAMINOPHEN 7.5-325 MG PO TABS: 1.0000 | ORAL_TABLET | Freq: Four times a day (QID) | ORAL | 0 refills | Status: DC | PRN

## 2019-08-01 MED ORDER — LUBIPROSTONE 8 MCG PO CAPS: 8.0000 ug | ORAL_CAPSULE | Freq: Two times a day (BID) | ORAL | 3 refills | Status: DC

## 2019-08-01 NOTE — Telephone Encounter (Signed)
Patient states she did not tell Dr. Dossie Arbour this at her [facility]. I sent a bubble message to Dr. Dossie Arbour.

## 2019-08-01 NOTE — Telephone Encounter (Signed)
He renewed her lyrica and hydrocodone. [facility] and the hydrocodone thru her regular pharmacy

## 2019-08-01 NOTE — Addendum Note (Signed)
Addended by: Milinda Pointer A on: 08/01/2019 02:51 PM   Modules accepted: Orders

## 2019-08-07 ENCOUNTER — Ambulatory Visit: Payer: Medicare Other | Admitting: Pain Medicine

## 2019-08-08 DIAGNOSIS — H401131 Primary open-angle glaucoma, bilateral, mild stage: Secondary | ICD-10-CM | POA: Diagnosis not present

## 2019-08-09 ENCOUNTER — Telehealth: Payer: Self-pay | Admitting: *Deleted

## 2019-08-13 DIAGNOSIS — H401132 Primary open-angle glaucoma, bilateral, moderate stage: Secondary | ICD-10-CM | POA: Diagnosis not present

## 2019-08-26 ENCOUNTER — Other Ambulatory Visit: Payer: Self-pay | Admitting: Physician Assistant

## 2019-08-26 DIAGNOSIS — G2581 Restless legs syndrome: Secondary | ICD-10-CM

## 2019-08-26 MED ORDER — ROPINIROLE HCL 3 MG PO TABS: 3.0000 mg | ORAL_TABLET | Freq: Every day | ORAL | 1 refills | Status: DC

## 2019-08-26 NOTE — Telephone Encounter (Signed)
Pt needing refills on:  rOPINIRole (REQUIP) 2 MG tablet   Please call into:  [place], [place] - [address] [phone] (Phone) [phone] (Fax)   [place], [place]

## 2019-08-26 NOTE — Telephone Encounter (Signed)
Please review

## 2019-08-26 NOTE — Telephone Encounter (Signed)
Pt called back to say she thinks the 2mg  strength is not working as well as it once did.  Pt wants to know if she could increase the dosage.  Please call pt back to let her know.  Thanks, [employer]

## 2019-08-26 NOTE — Telephone Encounter (Signed)
Have increased to 3mg 

## 2019-08-26 NOTE — Telephone Encounter (Signed)
Suzanne Avila's patient. Last OV 07/10/2019 and last refill was 04/08/2019 #90 with 1 refill.

## 2019-10-08 NOTE — Progress Notes (Deleted)
Patient: Suzanne Avila Female    DOB: 09-07-1946   73 y.o.   MRN: [id number] Visit Date: 10/08/2019  Today's Provider: Margaretann Loveless, PA-C   No chief complaint on file.  Subjective:     HPI   Hypertension, follow-up:  BP Readings from Last 3 Encounters:  07/10/19 135/77  07/02/19 140/84  06/27/19 (!) 149/66    She was last seen for hypertension 3 months ago.  BP at that visit was 135/77. Management since that visit includes stable. She reports {excellent/good/fair/poor:19665} compliance with treatment. She {ACTION; IS/IS [id number]} having side effects. *** She {is/is not:9024} exercising. She {is/is not:9024} adherent to low salt diet.   Outside blood pressures are ***. She is experiencing {Symptoms; cardiac:12860}.  Patient denies {Symptoms; cardiac:12860}.   Cardiovascular risk factors include {cv risk factors:510}.  Use of agents associated with hypertension: {bp agents assoc with hypertension:511::"none"}.     Weight trend: {trend:16658} Wt Readings from Last 3 Encounters:  07/19/19 95 lb (43.1 kg)  07/10/19 94 lb 4.8 oz (42.8 kg)  07/02/19 92 lb 12.8 oz (42.1 kg)    Current diet: {diet habits:16563}  ------------------------------------------------------------------------   Allergies  Allergen Reactions  . Codeine Itching  . Cyclobenzaprine Itching  . Penicillins     itching     Current Outpatient Medications:  .  albuterol (PROVENTIL HFA;VENTOLIN HFA) 108 (90 Base) MCG/ACT inhaler, ALBUTEROL, 90MCG/ACT (Inhalation Aerosol Solution)  2 PUFFS Every Day prn, Disp: 3 Inhaler, Rfl: 3 .  aspirin 325 MG tablet, Take 1 tablet (325 mg total) by mouth daily., Disp: 90 tablet, Rfl: 3 .  atorvastatin (LIPITOR) 40 MG tablet, Take 1 tablet (40 mg total) by mouth daily., Disp: 90 tablet, Rfl: 3 .  cholecalciferol (VITAMIN D) 1000 units tablet, Take 1 tablet (1,000 Units total) by mouth daily., Disp: 90 tablet, Rfl: 3 .  diltiazem (CARDIZEM) 120  MG tablet, Take 1 tablet (120 mg total) by mouth daily., Disp: 90 tablet, Rfl: 3 .  dorzolamide (TRUSOPT) 2 % ophthalmic solution, Place 1 drop into both eyes 3 (three) times daily. (Patient taking differently: Place 1 drop into both eyes 2 (two) times daily. ), Disp: 10 mL, Rfl: 3 .  Fluticasone-Salmeterol (ADVAIR DISKUS) 250-50 MCG/DOSE AEPB, USE 1 INHALATION TWICE A DAY, Disp: 180 each, Rfl: 3 .  furosemide (LASIX) 20 MG tablet, Take 1 tablet (20 mg total) by mouth daily., Disp: 90 tablet, Rfl: 3 .  HYDROcodone-acetaminophen (NORCO) 7.5-325 MG tablet, Take 1 tablet by mouth every 6 (six) hours as needed for severe pain. Must last 30 days, Disp: 120 tablet, Rfl: 0 .  HYDROcodone-acetaminophen (NORCO) 7.5-325 MG tablet, Take 1 tablet by mouth every 6 (six) hours as needed for severe pain. Must last 30 days, Disp: 120 tablet, Rfl: 0 .  [START ON 10/11/2019] HYDROcodone-acetaminophen (NORCO) 7.5-325 MG tablet, Take 1 tablet by mouth every 6 (six) hours as needed for severe pain. Must last 30 days, Disp: 120 tablet, Rfl: 0 .  latanoprost (XALATAN) 0.005 % ophthalmic solution, Place 1 drop into both eyes at bedtime., Disp: 2.5 mL, Rfl: 3 .  lubiprostone (AMITIZA) 8 MCG capsule, Take 1 capsule (8 mcg total) by mouth 2 (two) times daily with a meal. Swallow the medication whole. Do not break or chew the medication., Disp: 60 capsule, Rfl: 3 .  meloxicam (MOBIC) 15 MG tablet, Take 1 tablet (15 mg total) by mouth daily., Disp: 90 tablet, Rfl: 3 .  Multiple Vitamin (MULTI-VITAMIN) tablet, Take  by mouth., Disp: , Rfl:  .  omeprazole (PRILOSEC) 40 MG capsule, Take 1 capsule (40 mg total) by mouth daily., Disp: 90 capsule, Rfl: 3 .  potassium chloride (KLOR-CON) 20 MEQ packet, Take 20 mEq by mouth daily. (Patient not taking: Reported on 07/30/2019), Disp: 90 tablet, Rfl: 3 .  pregabalin (LYRICA) 150 MG capsule, Take 1 capsule (150 mg total) by mouth every 8 (eight) hours., Disp: 270 capsule, Rfl: 1 .  ramipril  (ALTACE) 10 MG capsule, Take 1 capsule (10 mg total) by mouth daily., Disp: 90 capsule, Rfl: 3 .  rOPINIRole (REQUIP) 3 MG tablet, Take 1 tablet (3 mg total) by mouth at bedtime., Disp: 90 tablet, Rfl: 1 .  sertraline (ZOLOFT) 50 MG tablet, Take 1 tablet (50 mg total) by mouth daily., Disp: 90 tablet, Rfl: 3 .  spironolactone (ALDACTONE) 25 MG tablet, Take 1 tablet (25 mg total) by mouth daily., Disp: 90 tablet, Rfl: 3 .  triamcinolone cream (KENALOG) 0.1 %, Apply 1 application topically 2 (two) times daily. (Patient not taking: Reported on 07/30/2019), Disp: 30 g, Rfl: 0 .  vitamin B-12 (CYANOCOBALAMIN) 500 MCG tablet, Take 1 tablet (500 mcg total) by mouth daily., Disp: 90 tablet, Rfl: 3  Review of Systems  Social History   Tobacco Use  . Smoking status: Current Every Day Smoker    Packs/day: 1.00    Years: 50.00    Pack years: 50.00    Types: Cigarettes  . Smokeless tobacco: Never Used  Substance Use Topics  . Alcohol use: Yes    Alcohol/week: 35.0 standard drinks    Types: 35 Cans of beer per week      Objective:   There were no vitals taken for this visit. There were no vitals filed for this visit.There is no height or weight on file to calculate BMI.   Physical Exam   No results found for any visits on 10/09/19.     Assessment & [place], PA-C  [facility]

## 2019-10-09 ENCOUNTER — Ambulatory Visit: Payer: Self-pay | Admitting: Physician Assistant

## 2019-10-16 NOTE — Progress Notes (Addendum)
Patient: Suzanne Avila Female    DOB: 02-12-1946   73 y.o.   MRN: [id number] Visit Date: 10/21/2019  Today's Provider: Mar Daring, PA-C   Chief Complaint  Patient presents with  . Hypertension   Subjective:     HPI   Hypertension, follow-up:  BP Readings from Last 3 Encounters:  10/21/19 128/86  07/10/19 135/77  07/02/19 140/84    She was last seen for hypertension 3 months ago.  BP at that visit was 135/77. Management since that visit includes none. She reports excellent compliance with treatment. She is not having side effects.  She is not exercising. She is not adherent to low salt diet.   Outside blood pressures are not being checked. She is experiencing none.  Patient denies chest pain and lower extremity edema.   Cardiovascular risk factors include advanced age (older than 59 for men, 65 for women), hypertension and smoking/ tobacco exposure.  Use of agents associated with hypertension: none.     Weight trend: stable Wt Readings from Last 3 Encounters:  10/21/19 97 lb 4.8 oz (44.1 kg)  07/19/19 95 lb (43.1 kg)  07/10/19 94 lb 4.8 oz (42.8 kg)    Current diet: well balanced  ------------------------------------------------------------------------   Allergies  Allergen Reactions  . Codeine Itching  . Cyclobenzaprine Itching  . Penicillins     itching     Current Outpatient Medications:  .  albuterol (PROVENTIL HFA;VENTOLIN HFA) 108 (90 Base) MCG/ACT inhaler, ALBUTEROL, 90MCG/ACT (Inhalation Aerosol Solution)  2 PUFFS Every Day prn, Disp: 3 Inhaler, Rfl: 3 .  aspirin 325 MG tablet, Take 1 tablet (325 mg total) by mouth daily., Disp: 90 tablet, Rfl: 3 .  atorvastatin (LIPITOR) 40 MG tablet, Take 1 tablet (40 mg total) by mouth daily., Disp: 90 tablet, Rfl: 3 .  cholecalciferol (VITAMIN D) 1000 units tablet, Take 1 tablet (1,000 Units total) by mouth daily., Disp: 90 tablet, Rfl: 3 .  diltiazem (CARDIZEM) 120 MG tablet, Take 1  tablet (120 mg total) by mouth daily., Disp: 90 tablet, Rfl: 3 .  dorzolamide (TRUSOPT) 2 % ophthalmic solution, Place 1 drop into both eyes 3 (three) times daily. (Patient taking differently: Place 1 drop into both eyes 2 (two) times daily. ), Disp: 10 mL, Rfl: 3 .  Fluticasone-Salmeterol (ADVAIR DISKUS) 250-50 MCG/DOSE AEPB, USE 1 INHALATION TWICE A DAY, Disp: 180 each, Rfl: 3 .  furosemide (LASIX) 20 MG tablet, Take 1 tablet (20 mg total) by mouth daily., Disp: 90 tablet, Rfl: 3 .  HYDROcodone-acetaminophen (NORCO) 7.5-325 MG tablet, Take 1 tablet by mouth every 6 (six) hours as needed for severe pain. Must last 30 days, Disp: 120 tablet, Rfl: 0 .  latanoprost (XALATAN) 0.005 % ophthalmic solution, Place 1 drop into both eyes at bedtime., Disp: 2.5 mL, Rfl: 3 .  meloxicam (MOBIC) 15 MG tablet, Take 1 tablet (15 mg total) by mouth daily., Disp: 90 tablet, Rfl: 3 .  Multiple Vitamin (MULTI-VITAMIN) tablet, Take by mouth., Disp: , Rfl:  .  omeprazole (PRILOSEC) 40 MG capsule, Take 1 capsule (40 mg total) by mouth daily., Disp: 90 capsule, Rfl: 3 .  pregabalin (LYRICA) 150 MG capsule, Take 1 capsule (150 mg total) by mouth every 8 (eight) hours., Disp: 270 capsule, Rfl: 1 .  ramipril (ALTACE) 10 MG capsule, Take 1 capsule (10 mg total) by mouth daily., Disp: 90 capsule, Rfl: 3 .  rOPINIRole (REQUIP) 3 MG tablet, Take 1 tablet (3 mg total)  by mouth at bedtime., Disp: 90 tablet, Rfl: 1 .  sertraline (ZOLOFT) 50 MG tablet, Take 1 tablet (50 mg total) by mouth daily., Disp: 90 tablet, Rfl: 3 .  spironolactone (ALDACTONE) 25 MG tablet, Take 1 tablet (25 mg total) by mouth daily., Disp: 90 tablet, Rfl: 3 .  vitamin B-12 (CYANOCOBALAMIN) 500 MCG tablet, Take 1 tablet (500 mcg total) by mouth daily., Disp: 90 tablet, Rfl: 3 .  HYDROcodone-acetaminophen (NORCO) 7.5-325 MG tablet, Take 1 tablet by mouth every 6 (six) hours as needed for severe pain. Must last 30 days, Disp: 120 tablet, Rfl: 0 .   HYDROcodone-acetaminophen (NORCO) 7.5-325 MG tablet, Take 1 tablet by mouth every 6 (six) hours as needed for severe pain. Must last 30 days, Disp: 120 tablet, Rfl: 0 .  lubiprostone (AMITIZA) 8 MCG capsule, Take 1 capsule (8 mcg total) by mouth 2 (two) times daily with a meal. Swallow the medication whole. Do not break or chew the medication. (Patient not taking: Reported on 10/21/2019), Disp: 60 capsule, Rfl: 3 .  potassium chloride (KLOR-CON) 20 MEQ packet, Take 20 mEq by mouth daily. (Patient not taking: Reported on 07/30/2019), Disp: 90 tablet, Rfl: 3 .  triamcinolone cream (KENALOG) 0.1 %, Apply 1 application topically 2 (two) times daily. (Patient not taking: Reported on 10/21/2019), Disp: 30 g, Rfl: 0  Review of Systems  Constitutional: Negative.   Respiratory: Negative.   Cardiovascular: Negative.   Neurological: Negative.     Social History   Tobacco Use  . Smoking status: Current Every Day Smoker    Packs/day: 1.00    Years: 50.00    Pack years: 50.00    Types: Cigarettes  . Smokeless tobacco: Never Used  Substance Use Topics  . Alcohol use: Yes    Alcohol/week: 35.0 standard drinks    Types: 35 Cans of beer per week      Objective:   BP 128/86 (BP Location: Left Arm, Patient Position: Sitting, Cuff Size: Normal)   Pulse 82   Temp (!) 97.3 F (36.3 C) (Temporal)   Resp 16   Wt 97 lb 4.8 oz (44.1 kg)   BMI 19.65 kg/m  Vitals:   10/21/19 1336  BP: 128/86  Pulse: 82  Resp: 16  Temp: (!) 97.3 F (36.3 C)  TempSrc: Temporal  Weight: 97 lb 4.8 oz (44.1 kg)  Body mass index is 19.65 kg/m.   Physical Exam Vitals reviewed.  Constitutional:      General: She is not in acute distress.    Appearance: Normal appearance. She is well-developed and normal weight. She is not ill-appearing or diaphoretic.  Cardiovascular:     Rate and Rhythm: Normal rate and regular rhythm.     Heart sounds: Normal heart sounds. No murmur. No friction rub. No gallop.   Pulmonary:      Effort: Pulmonary effort is normal. No respiratory distress.     Breath sounds: Wheezing and rhonchi present. No rales.  Musculoskeletal:     Cervical back: Normal range of motion and neck supple.  Neurological:     Mental Status: She is alert.      No results found for any visits on 10/21/19.     Assessment & Plan    1. Essential hypertension Stable. Diagnosis pulled for medication refill. Continue current medical treatment plan. Return in 3 months for AWV.  - diltiazem (CARDIZEM) 120 MG tablet; Take 1 tablet (120 mg total) by mouth daily.  Dispense: 90 tablet; Refill: 3 - spironolactone (ALDACTONE)  25 MG tablet; Take 1 tablet (25 mg total) by mouth daily.  Dispense: 90 tablet; Refill: 3  2. Renal artery stenosis (HCC) Stable. Diagnosis pulled for medication refill. Continue current medical treatment plan. - atorvastatin (LIPITOR) 40 MG tablet; Take 1 tablet (40 mg total) by mouth daily.  Dispense: 90 tablet; Refill: 3  3. Pure hypercholesterolemia Stable. Diagnosis pulled for medication refill. Continue current medical treatment plan. - atorvastatin (LIPITOR) 40 MG tablet; Take 1 tablet (40 mg total) by mouth daily.  Dispense: 90 tablet; Refill: 3  4. RLS (restless legs syndrome) Change from 3mg  nightly to 2mg  BID. Sent in new dose as below.  - rOPINIRole (REQUIP) 2 MG tablet; Take 1 tablet (2 mg total) by mouth 2 (two) times daily.  Dispense: 180 tablet; Refill: 1     Margaretann LovelessJennifer M Alexandros Ewan, PA-C  [facility]

## 2019-10-21 ENCOUNTER — Ambulatory Visit (INDEPENDENT_AMBULATORY_CARE_PROVIDER_SITE_OTHER): Payer: Medicare Other | Admitting: Physician Assistant

## 2019-10-21 ENCOUNTER — Other Ambulatory Visit: Payer: Self-pay

## 2019-10-21 ENCOUNTER — Encounter: Payer: Self-pay | Admitting: Physician Assistant

## 2019-10-21 VITALS — BP 128/86 | HR 82 | Temp 97.3°F | Resp 16 | Wt 97.3 lb

## 2019-10-21 DIAGNOSIS — E7800 Pure hypercholesterolemia, unspecified: Secondary | ICD-10-CM

## 2019-10-21 DIAGNOSIS — I701 Atherosclerosis of renal artery: Secondary | ICD-10-CM | POA: Diagnosis not present

## 2019-10-21 DIAGNOSIS — I10 Essential (primary) hypertension: Secondary | ICD-10-CM | POA: Diagnosis not present

## 2019-10-21 DIAGNOSIS — G2581 Restless legs syndrome: Secondary | ICD-10-CM | POA: Diagnosis not present

## 2019-10-21 MED ORDER — ROPINIROLE HCL 2 MG PO TABS: 2.0000 mg | ORAL_TABLET | Freq: Two times a day (BID) | ORAL | 1 refills | Status: DC

## 2019-10-21 MED ORDER — ATORVASTATIN CALCIUM 40 MG PO TABS: 40.0000 mg | ORAL_TABLET | Freq: Every day | ORAL | 3 refills | Status: DC

## 2019-10-21 MED ORDER — DILTIAZEM HCL 120 MG PO TABS: 120.0000 mg | ORAL_TABLET | Freq: Every day | ORAL | 3 refills | Status: DC

## 2019-10-21 MED ORDER — SPIRONOLACTONE 25 MG PO TABS: 25.0000 mg | ORAL_TABLET | Freq: Every day | ORAL | 3 refills | Status: DC

## 2019-11-04 ENCOUNTER — Encounter: Payer: Self-pay | Admitting: Pain Medicine

## 2019-11-05 NOTE — Progress Notes (Signed)
Patient: Suzanne Avila  Service Category: E/M  Provider: Oswaldo Done, MD  DOB: [birth year]  DOS: 11/06/2019  Location: Office  MRN: [id number]  Setting: Ambulatory outpatient  Referring Provider: Suella Grove*  Type: Established Patient  Specialty: Interventional Pain Management  PCP: Margaretann Loveless, PA-C  Location: Remote location  Delivery: TeleHealth     Virtual Encounter - Pain Management PROVIDER NOTE: Information contained herein reflects review and annotations entered in association with encounter. Interpretation of such information and data should be left to medically-trained personnel. Information provided to patient can be located elsewhere in the medical record under "Patient Instructions". Document created using STT-dictation technology, any transcriptional errors that may result from process are unintentional.    Contact & Pharmacy Preferred: [phone] Home: [phone] (home) Mobile: [phone] (mobile) E-mail: No e-mail address on record  [id number] - [place], [place] - [address] AT [facility] [address] [place] [address] Phone: [phone] Fax: [phone]  [facility] - [place], [place] - [address] [place] [address] Phone: [phone] Fax: [phone]   Pre-screening  Ms. Pottinger offered "in-person" vs "virtual" encounter. She indicated preferring virtual for this encounter.   Reason COVID-19*  Social distancing based on CDC and AMA recommendations.   I contacted [name] on 11/06/2019 via telephone.      I clearly identified myself as Oswaldo Done, MD. I verified that I was speaking with the correct person using two identifiers (Name: ALERA QUEVEDO, and date of birth: 1946-06-21).  Consent I sought verbal advanced consent from [name] for virtual visit interactions. I informed Ms.  Ellsworth of possible security and privacy concerns, risks, and limitations associated with providing "not-in-person" medical evaluation and management services. I also informed Ms. Nordmeyer of the availability of "in-person" appointments. Finally, I informed her that there would be a charge for the virtual visit and that she could be  personally, fully or partially, financially responsible for it. Ms. Kiene expressed understanding and agreed to proceed.   Historic Elements   Ms. MALICIA BLASDEL is a 74 y.o. year old, female patient evaluated today after her last encounter by our practice on 08/09/2019. Ms. Mittelstadt  has a past medical history of Glaucoma, Hyperlipidemia, and Hypertension. She also  has a past surgical history that includes Back surgery; Eye surgery; and Carpal tunnel release (Left). Ms. Outten has a current medication list which includes the following prescription(s): albuterol, aspirin, atorvastatin, cholecalciferol, diltiazem, dorzolamide, fluticasone-salmeterol, furosemide, [START ON 11/10/2019] hydrocodone-acetaminophen, [START ON 12/10/2019] hydrocodone-acetaminophen, [START ON 01/09/2020] hydrocodone-acetaminophen, latanoprost, lubiprostone, meloxicam, multi-vitamin, omeprazole, pregabalin, ramipril, ropinirole, sertraline, spironolactone, and vitamin b-12. She  reports that she has been smoking cigarettes. She has a 50.00 pack-year smoking history. She has never used smokeless tobacco. She reports current alcohol use of about 35.0 standard drinks of alcohol per week. She reports that she does not use drugs. Ms. Dambrosia is allergic to codeine; cyclobenzaprine; and penicillins.   HPI  Today, she is being contacted for medication management.  The patient indicates doing well with the current medication regimen. No adverse reactions or side effects reported to the medications.  In addition the patient indicates that she has some recurrence of the low back pain on the left side and she  would like to reschedule to repeat the injection that we did last time.  I have reviewed the record and  the last time we did some lumbar facet and SI joint injections that seem to have worked well for her.  We will go ahead and schedule her to have that repeated as soon as possible.  Pharmacotherapy Assessment  Analgesic: Hydrocodone/APAP 7.5/325, 1 tab PO q 6 hrs (30 mg/day of hydrocodone) MME/day:30 mg/day.   Monitoring: Pharmacotherapy: No side-effects or adverse reactions reported. [place] PMP: PDMP reviewed during this encounter.       Compliance: No problems identified. Effectiveness: Clinically acceptable. Plan: Refer to "POC".  UDS:  Summary  Date Value Ref Range Status  08/02/2018 FINAL  Final    Comment:    ==================================================================== TOXASSURE SELECT 13 (MW) ==================================================================== Test                             Result       Flag       Units Drug Present and Declared for Prescription Verification   Hydrocodone                    3379         EXPECTED   ng/mg creat   Hydromorphone                  1755         EXPECTED   ng/mg creat   Dihydrocodeine                 230          EXPECTED   ng/mg creat   Norhydrocodone                 1521         EXPECTED   ng/mg creat    Sources of hydrocodone include scheduled prescription    medications. Hydromorphone, dihydrocodeine and norhydrocodone are    expected metabolites of hydrocodone. Hydromorphone and    dihydrocodeine are also available as scheduled prescription    medications. ==================================================================== Test                      Result    Flag   Units      Ref Range   Creatinine              33               mg/dL      >=65 ==================================================================== Declared Medications:  The flagging and interpretation on this report are based on the  following declared  medications.  Unexpected results may arise from  inaccuracies in the declared medications.  **Note: The testing scope of this panel includes these medications:  Hydrocodone (Norco)  **Note: The testing scope of this panel does not include following  reported medications:  Acetaminophen (Norco)  Albuterol  Aspirin  Atorvastatin (Lipitor)  Cyanocobalamin  Diltiazem (Cardizem)  Dorzolamide  Fluticasone (Advair)  Latanoprost (Xalatan)  Meloxicam (Mobic)  Omeprazole (Prilosec)  Potassium (Klor-Con)  Pregabalin (Lyrica)  Ramipril (Altace)  Ropinirole (Requip)  Salmeterol (Advair)  Spironolactone (Aldactone)  Vitamin D ==================================================================== For clinical consultation, please call [phone]. ====================================================================    Laboratory Chemistry Profile (12 mo)  Renal: 01/09/2019: BUN 7; BUN/Creatinine Ratio 9; Creatinine, Ser 0.77  Lab Results  Component Value Date   GFRAA 89 01/09/2019   GFRNONAA 77 01/09/2019   Hepatic: 01/09/2019: Albumin 4.1 Lab Results  Component Value Date   AST 21 01/09/2019   ALT 12 01/09/2019  Other: No results found for requested labs within last 8760 hours. Note: Above Lab results reviewed.  Imaging  CT CHEST LUNG CANCER SCREENING LOW DOSE WO CONTRAST CLINICAL DATA:  74 year old female with 72 pack-year history of smoking. Lung cancer screening.  EXAM: CT CHEST WITHOUT CONTRAST LOW-DOSE FOR LUNG CANCER SCREENING  TECHNIQUE: Multidetector CT imaging of the chest was performed following the standard protocol without IV contrast.  COMPARISON:  07/09/2018  FINDINGS: Cardiovascular: The heart size is normal. No substantial pericardial effusion. Coronary artery calcification is evident. Atherosclerotic calcification is noted in the wall of the thoracic aorta.  Mediastinum/Nodes: No mediastinal lymphadenopathy. No evidence for gross hilar  lymphadenopathy although assessment is limited by the lack of intravenous contrast on today's study. The esophagus has normal imaging features. There is no axillary lymphadenopathy.  Lungs/Pleura: Centrilobular emphsyema noted. Previously identified pulmonary nodule is stable. No new suspicious pulmonary nodule or mass. No focal airspace consolidation. No pleural effusion.  Upper Abdomen: No focal abnormality in the liver on this study without intravenous contrast. Right kidney is markedly atrophic. There is abdominal aortic atherosclerosis without aneurysm.  Musculoskeletal: No worrisome lytic or sclerotic osseous abnormality. Old posterior left rib fractures again noted.  IMPRESSION: 1. Lung-RADS 2, benign appearance or behavior. Continue annual screening with low-dose chest CT without contrast in 12 months. 2.  Aortic Atherosclerois (ICD10-170.0) 3.  Emphysema. ([id number])  Electronically Signed   By: Misty Stanley M.D.   On: 07/19/2019 15:52 DG Elbow Complete Right CLINICAL DATA:  Swelling and tenderness of right elbow on posterior aspect. Tenderness at ulna per physician notes. Patient states no known injury.  EXAM: RIGHT ELBOW - COMPLETE 3+ VIEW  COMPARISON:  None.  FINDINGS: Soft tissue swelling is noted posterior to the proximal ulna. No radiopaque foreign body. No soft tissue air.  No fracture or bone lesion.  Small enthesophytes from the origins of the common extensor and common flexor tendons.  Elbow joint normally spaced and aligned.  No joint effusion.  IMPRESSION: 1. No fracture, bone lesion or significant elbow joint abnormality. 2. Posterior soft tissue swelling, posterior to the proximal ulna. No underlying bone abnormality. No radiopaque foreign body or soft tissue air.  Electronically Signed   By: Lajean Manes M.D.   On: 07/19/2019 15:04   Assessment  The primary encounter diagnosis was Chronic pain syndrome. 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  Plan of Care  Problem-specific:  No problem-specific Assessment & Plan notes found for this encounter.  I have discontinued Danija Gosa. Madkins's triamcinolone cream and potassium chloride. I am also having her start on HYDROcodone-acetaminophen and HYDROcodone-acetaminophen. Additionally, I am having her maintain her cholecalciferol, albuterol, aspirin, dorzolamide, Fluticasone-Salmeterol, latanoprost, vitamin B-12, meloxicam, omeprazole, ramipril, sertraline, Multi-Vitamin, furosemide, lubiprostone, pregabalin, diltiazem, spironolactone, atorvastatin, rOPINIRole, and HYDROcodone-acetaminophen.  Pharmacotherapy (Medications Ordered): Meds ordered this encounter  Medications  . HYDROcodone-acetaminophen (NORCO) 7.5-325 MG tablet    Sig: Take 1 tablet by mouth every 6 (six) hours as needed for severe pain. Must last 30 days    Dispense:  120 tablet    Refill:  0    Chronic Pain: STOP Act (Not applicable) Fill 1 day early if closed on refill date. Do not fill until: 11/10/2019. To last until: 12/10/2019. Avoid  benzodiazepines within 8 hours of opioids  . HYDROcodone-acetaminophen (NORCO) 7.5-325 MG tablet    Sig: Take 1 tablet by mouth every 6 (six) hours as needed for severe pain. Must last 30 days    Dispense:  120 tablet    Refill:  0    Chronic Pain: STOP Act (Not applicable) Fill 1 day early if closed on refill date. Do not fill until: 12/10/2019. To last until: 01/09/2020. Avoid benzodiazepines within 8 hours of opioids  . HYDROcodone-acetaminophen (NORCO) 7.5-325 MG tablet    Sig: Take 1 tablet by mouth every 6 (six) hours as needed for severe pain. Must last 30 days    Dispense:  120  tablet    Refill:  0    Chronic Pain: STOP Act (Not applicable) Fill 1 day early if closed on refill date. Do not fill until: 01/09/2020. To last until: 02/08/2020. Avoid benzodiazepines within 8 hours of opioids   Orders:  Orders Placed This Encounter  Procedures  . L-FCT Blk (Schedule)    Standing Status:   Future    Standing Expiration Date:   12/07/2019    Scheduling Instructions:     Procedure: Lumbar facet block (AKA.: Lumbosacral medial branch nerve block)     Side: Left-sided     Level: L3-4, L4-5, & L5-S1 Facets (L2, L3, L4, L5, & S1 Medial Branch Nerves)     Sedation: Patient's choice.     Timeframe: ASAA    Order Specific Question:   Where will this procedure be performed?    Answer:   [facility]  . S-I Blk (Schedule)    Standing Status:   Future    Standing Expiration Date:   12/07/2019    Scheduling Instructions:     Side: Left-sided     Sedation: Patient's choice.     Timeframe: ASAP    Order Specific Question:   Where will this procedure be performed?    Answer:   [facility]   Follow-up plan:   Return in about 13 weeks (around 02/05/2020) for (VV), (MM), in addition, Procedure (w/ sedation): (L) L-FCT + SI BLK.      Considering:   Diagnostic bilateral IA hip joint injection  Possible bilateral hip joint RFA  Possible bilateral lumbar facet RFA  Possible left SI joint RFA  Diagnostic bilateral L4-L5 TFESI  Diagnostic caudal ESI  Diagnostic caudal epidurogram  Possible RACZ epidural lysis of adhesions  Diagnostic left L2-3 LESI  Possible bilateral Lumbar SCS trial implant    Palliative PRN treatment(s):   Diagnostic bilateral lumbar facet block #2  Diagnostic left SI joint block #2      Recent Visits No visits were found meeting these conditions.  Showing recent visits within past 90 days and meeting all other requirements   Today's Visits Date Type Provider Dept  11/06/19 Telemedicine Delano Metz, MD [facility]   Showing today's visits and meeting all other requirements   Future Appointments No visits were found meeting these conditions.  Showing future appointments within next 90 days and meeting all other requirements   I discussed the assessment and treatment plan with the patient. The patient was provided an opportunity to ask questions and all were answered. The patient agreed with the plan and demonstrated an understanding of the instructions.  Patient advised to call back or seek an in-person evaluation if the symptoms or condition worsens.  Total duration of non-face-to-face encounter: 15 minutes.  Note by: Oswaldo Done, MD Date:  11/06/2019; Time: 12:18 PM

## 2019-11-06 ENCOUNTER — Ambulatory Visit: Payer: TRICARE For Life (TFL) | Attending: Pain Medicine | Admitting: Pain Medicine

## 2019-11-06 ENCOUNTER — Other Ambulatory Visit: Payer: Self-pay

## 2019-11-06 DIAGNOSIS — M5442 Lumbago with sciatica, left side: Secondary | ICD-10-CM

## 2019-11-06 DIAGNOSIS — M5441 Lumbago with sciatica, right side: Secondary | ICD-10-CM

## 2019-11-06 DIAGNOSIS — M79605 Pain in left leg: Secondary | ICD-10-CM | POA: Diagnosis not present

## 2019-11-06 DIAGNOSIS — G8929 Other chronic pain: Secondary | ICD-10-CM

## 2019-11-06 DIAGNOSIS — M47816 Spondylosis without myelopathy or radiculopathy, lumbar region: Secondary | ICD-10-CM

## 2019-11-06 DIAGNOSIS — G894 Chronic pain syndrome: Secondary | ICD-10-CM

## 2019-11-06 DIAGNOSIS — M533 Sacrococcygeal disorders, not elsewhere classified: Secondary | ICD-10-CM

## 2019-11-06 DIAGNOSIS — M79602 Pain in left arm: Secondary | ICD-10-CM

## 2019-11-06 DIAGNOSIS — M79601 Pain in right arm: Secondary | ICD-10-CM

## 2019-11-06 DIAGNOSIS — M79604 Pain in right leg: Secondary | ICD-10-CM

## 2019-11-06 MED ORDER — HYDROCODONE-ACETAMINOPHEN 7.5-325 MG PO TABS: 1.0000 | ORAL_TABLET | Freq: Four times a day (QID) | ORAL | 0 refills | Status: DC | PRN

## 2019-11-19 ENCOUNTER — Other Ambulatory Visit: Payer: Self-pay

## 2019-11-19 ENCOUNTER — Encounter: Payer: Self-pay | Admitting: Pain Medicine

## 2019-11-19 ENCOUNTER — Ambulatory Visit (HOSPITAL_BASED_OUTPATIENT_CLINIC_OR_DEPARTMENT_OTHER): Payer: Medicare Other | Admitting: Pain Medicine

## 2019-11-19 ENCOUNTER — Ambulatory Visit
Admission: RE | Admit: 2019-11-19 | Discharge: 2019-11-19 | Disposition: A | Discharge status: Home / Self Care | Payer: Medicare Other | Source: Ambulatory Visit | Attending: Pain Medicine | Admitting: Pain Medicine

## 2019-11-19 VITALS — BP 188/97 | HR 86 | Temp 97.6°F | Resp 16 | Ht 59.0 in | Wt 97.0 lb

## 2019-11-19 DIAGNOSIS — M47816 Spondylosis without myelopathy or radiculopathy, lumbar region: Secondary | ICD-10-CM | POA: Insufficient documentation

## 2019-11-19 DIAGNOSIS — M5442 Lumbago with sciatica, left side: Secondary | ICD-10-CM | POA: Insufficient documentation

## 2019-11-19 DIAGNOSIS — M533 Sacrococcygeal disorders, not elsewhere classified: Secondary | ICD-10-CM | POA: Insufficient documentation

## 2019-11-19 DIAGNOSIS — M5441 Lumbago with sciatica, right side: Secondary | ICD-10-CM | POA: Diagnosis present

## 2019-11-19 DIAGNOSIS — M5137 Other intervertebral disc degeneration, lumbosacral region: Secondary | ICD-10-CM | POA: Insufficient documentation

## 2019-11-19 DIAGNOSIS — M47817 Spondylosis without myelopathy or radiculopathy, lumbosacral region: Secondary | ICD-10-CM | POA: Insufficient documentation

## 2019-11-19 DIAGNOSIS — G8929 Other chronic pain: Secondary | ICD-10-CM

## 2019-11-19 DIAGNOSIS — M5388 Other specified dorsopathies, sacral and sacrococcygeal region: Secondary | ICD-10-CM | POA: Diagnosis present

## 2019-11-19 MED ORDER — FENTANYL CITRATE (PF) 100 MCG/2ML IJ SOLN
25.0000 ug | INTRAMUSCULAR | Status: DC | PRN
  Administered 2019-11-19: 50 ug via INTRAVENOUS

## 2019-11-19 MED ORDER — ROPIVACAINE HCL 2 MG/ML IJ SOLN
9.0000 mL | Freq: Once | INTRAMUSCULAR | Status: AC
  Administered 2019-11-19: 11:00:00 9 mL via PERINEURAL

## 2019-11-19 MED ORDER — LACTATED RINGERS IV SOLN
1000.0000 mL | Freq: Once | INTRAVENOUS | Status: AC
  Administered 2019-11-19: 11:00:00 1000 mL via INTRAVENOUS

## 2019-11-19 MED ORDER — MIDAZOLAM HCL 5 MG/5ML IJ SOLN
INTRAMUSCULAR | Status: AC
  Filled 2019-11-19: qty 5

## 2019-11-19 MED ORDER — FENTANYL CITRATE (PF) 100 MCG/2ML IJ SOLN
INTRAMUSCULAR | Status: AC
  Filled 2019-11-19: qty 2

## 2019-11-19 MED ORDER — TRIAMCINOLONE ACETONIDE 40 MG/ML IJ SUSP
40.0000 mg | Freq: Once | INTRAMUSCULAR | Status: AC
  Administered 2019-11-19: 11:00:00 40 mg

## 2019-11-19 MED ORDER — MIDAZOLAM HCL 5 MG/5ML IJ SOLN
1.0000 mg | INTRAMUSCULAR | Status: DC | PRN
  Administered 2019-11-19: 11:00:00 2 mg via INTRAVENOUS

## 2019-11-19 MED ORDER — ROPIVACAINE HCL 2 MG/ML IJ SOLN
4.0000 mL | Freq: Once | INTRAMUSCULAR | Status: AC
  Administered 2019-11-19: 4 mL via INTRA_ARTICULAR
  Filled 2019-11-19: qty 10

## 2019-11-19 MED ORDER — TRIAMCINOLONE ACETONIDE 40 MG/ML IJ SUSP
INTRAMUSCULAR | Status: AC
  Filled 2019-11-19: qty 1

## 2019-11-19 MED ORDER — LIDOCAINE HCL 2 % IJ SOLN
20.0000 mL | Freq: Once | INTRAMUSCULAR | Status: AC
  Administered 2019-11-19: 400 mg
  Filled 2019-11-19: qty 20

## 2019-11-19 MED ORDER — METHYLPREDNISOLONE ACETATE 80 MG/ML IJ SUSP
80.0000 mg | Freq: Once | INTRAMUSCULAR | Status: AC
  Administered 2019-11-19: 80 mg via INTRA_ARTICULAR
  Filled 2019-11-19: qty 1

## 2019-11-19 MED ORDER — ROPIVACAINE HCL 2 MG/ML IJ SOLN
INTRAMUSCULAR | Status: AC
  Filled 2019-11-19: qty 10

## 2019-11-19 NOTE — Progress Notes (Signed)
PROVIDER NOTE: Information contained herein reflects review and annotations entered in association with encounter. Interpretation of such information and data should be left to medically-trained personnel. Information provided to patient can be located elsewhere in the medical record under "Patient Instructions". Document created using STT-dictation technology, any transcriptional errors that may result from process are unintentional.    Patient: Suzanne Avila  Service Category: Procedure  Provider: Oswaldo Done, MD  DOB: 04-29-1946  DOS: 11/19/2019  Location: [facility]  MRN: [id number]  Setting: Ambulatory - outpatient  Referring Provider: Suella Grove*  Type: Established Patient  Specialty: Interventional Pain Management  PCP: Margaretann Loveless, PA-C   Primary Reason for Visit: Interventional Pain Management Treatment. CC: Back Pain (lower)  Procedure:          Anesthesia, Analgesia, Anxiolysis:  Procedure #1: Type: Medial Branch Facet Block #2 Primary Purpose: Diagnostic Region: Lumbar Level: L2, L3, L4, L5, & S1 Medial Branch Level(s) Target Area: For Lumbar Facet blocks, the target is the groove formed by the junction of the transverse process and superior articular process. For the L5 dorsal ramus, the target is the notch between superior articular process and sacral ala. For the S1 dorsal ramus, the target is the superior and lateral edge of the posterior S1 Sacral foramen. Approach: Posterior, paramedial, percutaneous approach. Laterality: Left  Procedure #2: Type: Sacroiliac Joint Block  #2  Primary Purpose: Diagnostic Region: Posterior Lumbosacral Level: PSIS (Posterior Superior Iliac Spine) Sacroiliac Joint Target Area: For upper sacroiliac joint block(s), the target is the superior and posterior margin of the sacroiliac joint. Approach: Ipsilateral approach. Laterality: Left  Type: Moderate (Conscious) Sedation combined with Local  Anesthesia Indication(s): Analgesia and Anxiety Route: Intravenous (IV) IV Access: Secured Sedation: Meaningful verbal contact was maintained at all times during the procedure  Local Anesthetic: Lidocaine 1-2%  Position: Prone   Indications: 1. 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 Spondylosis without myelopathy or radiculopathy, lumbosacral region   3. Chronic sacroiliac joint pain (Left)   4. Other specified dorsopathies, sacral and sacrococcygeal region   5. DDD (degenerative disc disease), lumbosacral   6. Chronic low back pain (Secondary area of Pain) (Bilateral) (L>R)    Pain Score: Pre-procedure: 4 /10 Post-procedure: 3 /10   Pre-op Assessment:  Suzanne Avila is a 74 y.o. (year old), female patient, seen today for interventional treatment. She  has a past surgical history that includes Back surgery; Eye surgery; and Carpal tunnel release (Left). Suzanne Avila has a current medication list which includes the following prescription(s): albuterol, aspirin, atorvastatin, cholecalciferol, diltiazem, dorzolamide, fluticasone-salmeterol, furosemide, hydrocodone-acetaminophen, [START ON 12/10/2019] hydrocodone-acetaminophen, [START ON 01/09/2020] hydrocodone-acetaminophen, latanoprost, lubiprostone, multi-vitamin, omeprazole, pregabalin, ramipril, ropinirole, sertraline, spironolactone, vitamin b-12, and meloxicam, and the following Facility-Administered Medications: fentanyl and midazolam. Her primarily concern today is the Back Pain (lower)  Initial Vital Signs:  Pulse/HCG Rate: 86ECG Heart Rate: 86 Temp: 97.8 F (36.6 C) Resp: 16 BP: (!) 180/78 SpO2: 98 %  BMI: Estimated body mass index is 19.59 kg/m as calculated from the following:   Height as of this encounter: 4\' 11"  (1.499 m).   Weight as of this encounter: 97 lb (44 kg).  Risk Assessment: Allergies: Reviewed. She is allergic to codeine; cyclobenzaprine; and penicillins.  Allergy Precautions: None  required Coagulopathies: Reviewed. None identified.  Blood-thinner therapy: None at this time Active Infection(s): Reviewed. None identified. Suzanne Avila is afebrile  Site Confirmation: Suzanne Avila was asked to confirm the procedure and laterality before  marking the site Procedure checklist: Completed Consent: Before the procedure and under the influence of no sedative(s), amnesic(s), or anxiolytics, the patient was informed of the treatment options, risks and possible complications. To fulfill our ethical and legal obligations, as recommended by the American Medical Association's Code of Ethics, I have informed the patient of my clinical impression; the nature and purpose of the treatment or procedure; the risks, benefits, and possible complications of the intervention; the alternatives, including doing nothing; the risk(s) and benefit(s) of the alternative treatment(s) or procedure(s); and the risk(s) and benefit(s) of doing nothing. The patient was provided information about the general risks and possible complications associated with the procedure. These may include, but are not limited to: failure to achieve desired goals, infection, bleeding, organ or nerve damage, allergic reactions, paralysis, and death. In addition, the patient was informed of those risks and complications associated to Spine-related procedures, such as failure to decrease pain; infection (i.e.: Meningitis, epidural or intraspinal abscess); bleeding (i.e.: epidural hematoma, subarachnoid hemorrhage, or any other type of intraspinal or peri-dural bleeding); organ or nerve damage (i.e.: Any type of peripheral nerve, nerve root, or spinal cord injury) with subsequent damage to sensory, motor, and/or autonomic systems, resulting in permanent pain, numbness, and/or weakness of one or several areas of the body; allergic reactions; (i.e.: anaphylactic reaction); and/or death. Furthermore, the patient was informed of those risks and  complications associated with the medications. These include, but are not limited to: allergic reactions (i.e.: anaphylactic or anaphylactoid reaction(s)); adrenal axis suppression; blood sugar elevation that in diabetics may result in ketoacidosis or comma; water retention that in patients with history of congestive heart failure may result in shortness of breath, pulmonary edema, and decompensation with resultant heart failure; weight gain; swelling or edema; medication-induced neural toxicity; particulate matter embolism and blood vessel occlusion with resultant organ, and/or nervous system infarction; and/or aseptic necrosis of one or more joints. Finally, the patient was informed that Medicine is not an exact science; therefore, there is also the possibility of unforeseen or unpredictable risks and/or possible complications that may result in a catastrophic outcome. The patient indicated having understood very clearly. We have given the patient no guarantees and we have made no promises. Enough time was given to the patient to ask questions, all of which were answered to the patient's satisfaction. Ms. Rinck has indicated that she wanted to continue with the procedure. Attestation: I, the ordering provider, attest that I have discussed with the patient the benefits, risks, side-effects, alternatives, likelihood of achieving goals, and potential problems during recovery for the procedure that I have provided informed consent. Date  Time: 11/19/2019 10:08 AM  Pre-Procedure Preparation:  Monitoring: As per clinic protocol. Respiration, ETCO2, SpO2, BP, heart rate and rhythm monitor placed and checked for adequate function Safety Precautions: Patient was assessed for positional comfort and pressure points before starting the procedure. Time-out: I initiated and conducted the "Time-out" before starting the procedure, as per protocol. The patient was asked to participate by confirming the accuracy of the  "Time Out" information. Verification of the correct person, site, and procedure were performed and confirmed by me, the nursing staff, and the patient. "Time-out" conducted as per Joint Commission's Universal Protocol (UP.01.01.01). Time: 1043  Description of Procedure #1:   Time-out: "Time-out" completed before starting procedure, as per protocol. Area Prepped: Entire Posterior Lumbosacral Region Prepping solution: DuraPrep (Iodine Povacrylex [0.7% available iodine] and Isopropyl Alcohol, 74% w/w) Safety Precautions: Aspiration looking for blood return was conducted prior to  all injections. At no point did we inject any substances, as a needle was being advanced. No attempts were made at seeking any paresthesias. Safe injection practices and needle disposal techniques used. Medications properly checked for expiration dates. SDV (single dose vial) medications used.  Description of the Procedure: Protocol guidelines were followed. The patient was placed in position over the fluoroscopy table. The target area was identified and the area prepped in the usual manner. Skin & deeper tissues infiltrated with local anesthetic. Appropriate amount of time allowed to pass for local anesthetics to take effect. The procedure needle was introduced through the skin, ipsilateral to the reported pain, and advanced to the target area. Employing the "Medial Branch Technique", the needles were advanced to the angle made by the superior and medial portion of the transverse process, and the lateral and inferior portion of the superior articulating process of the targeted vertebral bodies. This area is known as "Burton's Eye" or the "Eye of the [place]". A procedure needle was introduced through the skin, and this time advanced to the angle made by the superior and medial border of the sacral ala, and the lateral border of the S1 vertebral body. This last needle was later repositioned at the superior and lateral border of the  posterior S1 foramen. Negative aspiration confirmed. Solution injected in intermittent fashion, asking for systemic symptoms every 0.5cc of injectate. The needles were then removed and the area cleansed, making sure to leave some of the prepping solution back to take advantage of its long term bactericidal properties. Start Time: 1044 hrs. Materials:  Needle(s) Type: Spinal Needle Gauge: 22G Length: 3.5-in Medication(s): Please see orders for medications and dosing details.  Description of Procedure # 2:   Area Prepped: Entire Posterior Lumbosacral Region Prepping solution: DuraPrep (Iodine Povacrylex [0.7% available iodine] and Isopropyl Alcohol, 74% w/w) Safety Precautions: Aspiration looking for blood return was conducted prior to all injections. At no point did we inject any substances, as a needle was being advanced. No attempts were made at seeking any paresthesias. Safe injection practices and needle disposal techniques used. Medications properly checked for expiration dates. SDV (single dose vial) medications used. Description of the Procedure: Protocol guidelines were followed. The patient was placed in position over the fluoroscopy table. The target area was identified and the area prepped in the usual manner. Skin & deeper tissues infiltrated with local anesthetic. Appropriate amount of time allowed to pass for local anesthetics to take effect. The procedure needle was advanced under fluoroscopic guidance into the sacroiliac joint until a firm endpoint was obtained. Proper needle placement secured. Negative aspiration confirmed. Solution injected in intermittent fashion, asking for systemic symptoms every 0.5cc of injectate. The needles were then removed and the area cleansed, making sure to leave some of the prepping solution back to take advantage of its long term bactericidal properties. Vitals:   11/19/19 1056 11/19/19 1059 11/19/19 1109 11/19/19 1118  BP: (!) 163/81 (!) 166/89 (!)  154/141 (!) 188/97  Pulse:      Resp: 16 16 16 16   Temp:  97.7 F (36.5 C)  97.6 F (36.4 C)  TempSrc:  Temporal  Temporal  SpO2: 99% 97% 97% 97%  Weight:      Height:        End Time:   hrs. Materials:  Needle(s) Type: Spinal Needle Gauge: 22G Length: 3.5-in Medication(s): Please see orders for medications and dosing details.  Imaging Guidance (Spinal):          Type  of Imaging Technique: Fluoroscopy Guidance (Spinal) Indication(s): Assistance in needle guidance and placement for procedures requiring needle placement in or near specific anatomical locations not easily accessible without such assistance. Exposure Time: Please see nurses notes. Contrast: None used. Fluoroscopic Guidance: I was personally present during the use of fluoroscopy. "Tunnel Vision Technique" used to obtain the best possible view of the target area. Parallax error corrected before commencing the procedure. "Direction-depth-direction" technique used to introduce the needle under continuous pulsed fluoroscopy. Once target was reached, antero-posterior, oblique, and lateral fluoroscopic projection used confirm needle placement in all planes. Images permanently stored in EMR. Interpretation: No contrast injected. I personally interpreted the imaging intraoperatively. Adequate needle placement confirmed in multiple planes. Permanent images saved into the patient's record.  Antibiotic Prophylaxis:   Anti-infectives (From admission, onward)   None     Indication(s): None identified  Post-operative Assessment:  Post-procedure Vital Signs:  Pulse/HCG Rate: 8683 Temp: 97.6 F (36.4 C) Resp: 16 BP: (!) 188/97 SpO2: 97 %  EBL: None  Complications: No immediate post-treatment complications observed by team, or reported by patient.  Note: The patient tolerated the entire procedure well. A repeat set of vitals were taken after the procedure and the patient was kept under observation following institutional  policy, for this type of procedure. Post-procedural neurological assessment was performed, showing return to baseline, prior to discharge. The patient was provided with post-procedure discharge instructions, including a section on how to identify potential problems. Should any problems arise concerning this procedure, the patient was given instructions to immediately contact us, at any time, without hesitation. In any case, we plan to contact the patient by telephone for a follow-up status report regarding this interventional procedure.  Comments:  No additional relevant information.  Plan of Care  Orders:  Orders Placed This Encounter  Procedures  . LUMBAR FACET(MEDIAL BRANCH NERVE BLOCK) MBNB    Scheduling Instructions:     Procedure: Lumbar facet block (AKA.: Lumbosacral medial branch nerve block)     Side: Left-sided     Level: L3-4, L4-5, & L5-S1 Facets (L2, L3, L4, L5, & S1 Medial Branch Nerves)     Sedation: Patient's choice.     Timeframe: Today    Order Specific Question:   Where will this procedure be performed?    Answer:   [facility]  . SACROILIAC JOINT INJECTION    Scheduling Instructions:     Side: Left-sided     Sedation: With Sedation.     Timeframe: Today    Order Specific Question:   Where will this procedure be performed?    Answer:   [facility]  . DG PAIN CLINIC C-ARM 1-60 MIN NO REPORT    Intraoperative interpretation by procedural physician at [facility].    Standing Status:   Standing    Number of Occurrences:   1    Order Specific Question:   Reason for exam:    Answer:   Assistance in needle guidance and placement for procedures requiring needle placement in or near specific anatomical locations not easily accessible without such assistance.  . Informed Consent Details: Physician/Practitioner Attestation; Transcribe to consent form and obtain patient signature    Nursing Order: Transcribe to consent form and obtain patient  signature. Note: Always confirm laterality of pain with Ms. Claiborne Billingsallahan, before procedure. Procedure: Lumbar Facet Block  under fluoroscopic guidance Indication/Reason: Low Back Pain, with our without leg pain, due to Facet Joint Arthralgia (Joint Pain) known as Lumbar Facet Syndrome,  secondary to Lumbar, and/or Lumbosacral Spondylosis (Arthritis of the Spine), without myelopathy or radiculopathy (Nerve Damage). Provider Attestation: I, Delmi Fulfer A. Laban Emperor, MD, (Pain Management Specialist), the physician/practitioner, attest that I have discussed with the patient the benefits, risks, side effects, alternatives, likelihood of achieving goals and potential problems during recovery for the procedure that I have provided informed consent.  . Provide equipment / supplies at bedside    Equipment required: Single use, disposable, "Block Tray"    Standing Status:   Standing    Number of Occurrences:   1    Order Specific Question:   Specify    Answer:   Block Tray  . Informed Consent Details: Physician/Practitioner Attestation; Transcribe to consent form and obtain patient signature    Provider Attestation: I, Madelyn Tlatelpa A. Laban Emperor, MD, (Pain Management Specialist), the physician/practitioner, attest that I have discussed with the patient the benefits, risks, side effects, alternatives, likelihood of achieving goals and potential problems during recovery for the procedure that I have provided informed consent.    Scheduling Instructions:     Procedure: Sacroiliac Joint Block     Indication/Reason: Chronic Low Back and Hip Pain secondary to Sacroiliac Joint Pain (Arthralgia/Arthropathy)     Nursing Order: Transcribe to consent form and obtain patient signature.     Note: Always confirm laterality of pain with Ms. Claiborne Billings, before procedure.   Chronic Opioid Analgesic:  Hydrocodone/APAP 7.5/325, 1 tab PO q 6 hrs (30 mg/day of hydrocodone) MME/day:30 mg/day.   Medications ordered for procedure: Meds  ordered this encounter  Medications  . lidocaine (XYLOCAINE) 2 % (with pres) injection 400 mg  . lactated ringers infusion 1,000 mL  . midazolam (VERSED) 5 MG/5ML injection 1-2 mg    Make sure Flumazenil is available in the pyxis when using this medication. If oversedation occurs, administer 0.2 mg IV over 15 sec. If after 45 sec no response, administer 0.2 mg again over 1 min; may repeat at 1 min intervals; not to exceed 4 doses (1 mg)  . fentaNYL (SUBLIMAZE) injection 25-50 mcg    Make sure Narcan is available in the pyxis when using this medication. In the event of respiratory depression (RR< 8/min): Titrate NARCAN (naloxone) in increments of 0.1 to 0.2 mg IV at 2-3 minute intervals, until desired degree of reversal.  . ropivacaine (PF) 2 mg/mL (0.2%) (NAROPIN) injection 9 mL  . triamcinolone acetonide (KENALOG-40) injection 40 mg  . methylPREDNISolone acetate (DEPO-MEDROL) injection 80 mg  . ropivacaine (PF) 2 mg/mL (0.2%) (NAROPIN) injection 4 mL   Medications administered: We administered lidocaine, lactated ringers, midazolam, fentaNYL, ropivacaine (PF) 2 mg/mL (0.2%), triamcinolone acetonide, methylPREDNISolone acetate, and ropivacaine (PF) 2 mg/mL (0.2%).  See the medical record for exact dosing, route, and time of administration.  Follow-up plan:   Return in about 2 weeks (around 12/03/2019) for (VV), (PP).       Considering:   Diagnostic bilateral IA hip joint injection  Possible bilateral hip joint RFA  Possible bilateral lumbar facet RFA  Possible left SI joint RFA  Diagnostic bilateral L4-L5 TFESI  Diagnostic caudal ESI  Diagnostic caudal epidurogram  Possible RACZ epidural lysis of adhesions  Diagnostic left L2-3 LESI  Possible bilateral Lumbar SCS trial implant    Palliative PRN treatment(s):   Diagnostic bilateral lumbar facet block #2  Diagnostic left SI joint block #2     Recent Visits Date Type Provider Dept  11/06/19 Telemedicine Delano Metz, MD  [facility]  Showing recent visits within past 90 days  and meeting all other requirements   Today's Visits Date Type Provider Dept  11/19/19 Procedure visit Milinda Pointer, MD [facility]  Showing today's visits and meeting all other requirements   Future Appointments Date Type Provider Dept  12/02/19 Appointment Milinda Pointer, MD [facility]  02/05/20 Appointment Milinda Pointer, MD [facility]  Showing future appointments within next 90 days and meeting all other requirements   Disposition: Discharge home  Discharge (Date  Time): 11/19/2019; 1124 hrs.   Primary Care Physician: Mar Daring, PA-C Location: [facility] Note by: Gaspar Cola, MD Date: 11/19/2019; Time: 12:33 PM  Disclaimer:  Medicine is not an Chief Strategy Officer. The only guarantee in medicine is that nothing is guaranteed. It is important to note that the decision to proceed with this intervention was based on the information collected from the patient. The Data and conclusions were drawn from the patient's questionnaire, the interview, and the physical examination. Because the information was provided in large part by the patient, it cannot be guaranteed that it has not been purposely or unconsciously manipulated. Every effort has been made to obtain as much relevant data as possible for this evaluation. It is important to note that the conclusions that lead to this procedure are derived in large part from the available data. Always take into account that the treatment will also be dependent on availability of resources and existing treatment guidelines, considered by other Pain Management Practitioners as being common knowledge and practice, at the time of the intervention. For Medico-Legal purposes, it is also important to point out that variation in procedural techniques and pharmacological choices are the acceptable norm. The  indications, contraindications, technique, and results of the above procedure should only be interpreted and judged by a Board-Certified Interventional Pain Specialist with extensive familiarity and expertise in the same exact procedure and technique.

## 2019-11-19 NOTE — Patient Instructions (Signed)

## 2019-11-20 ENCOUNTER — Telehealth: Payer: Self-pay

## 2019-11-20 NOTE — Telephone Encounter (Signed)
Post procedure phone call.  Pt states she is doing good.   

## 2019-11-29 ENCOUNTER — Encounter: Payer: Self-pay | Admitting: Pain Medicine

## 2019-11-29 NOTE — Progress Notes (Signed)
Pain relief after procedure (treated area only): (Questions asked to patient) 1. Starting about 15 minutes after the procedure, and "while the area was still numb" (from the local anesthetics), were you having any of your usual pain "in that area" (the treated area)?  (NOTE: NOT including the discomfort from the needle sticks.) First 1 hour:50 % better. First 4-6 hours:50 % better. 2. How long did the numbness from the local anesthetics last? (More than 6 hours?) Duration:36 hours.  3. How much better is your pain now, when compared to before the procedure? Current benefit: 90 % better. 4. Can you move better now? Improvement in ROM (Range of Motion): Yes. 5. Can you do more now? Improvement in function: Yes. 4. Did you have any problems with the procedure? Side-effects/Complications: No.

## 2019-12-01 NOTE — Progress Notes (Signed)
Patient: Suzanne Avila  Service Category: E/M  Provider: Gaspar Cola, MD  DOB: 1946/06/04  DOS: 12/02/2019  Location: Office  MRN: [id number]  Setting: Ambulatory outpatient  Referring Provider: Florian Buff*  Type: Established Patient  Specialty: Interventional Pain Management  PCP: Mar Daring, PA-C  Location: Remote location  Delivery: TeleHealth     Virtual Encounter - Pain Management PROVIDER NOTE: Information contained herein reflects review and annotations entered in association with encounter. Interpretation of such information and data should be left to medically-trained personnel. Information provided to patient can be located elsewhere in the medical record under "Patient Instructions". Document created using STT-dictation technology, any transcriptional errors that may result from process are unintentional.    Contact & Pharmacy Preferred: [phone] Home: [phone] (home) Mobile: [phone] (mobile) E-mail: No e-mail address on record  [facility] [place], [place] - [address] [place] [address] Phone: [phone] Fax: [phone]  [facility] [place], [place] [address] [place] [address] Phone: [phone] Fax: [phone]   Pre-screening  Ms. Broecker offered "in-person" vs "virtual" encounter. She indicated preferring virtual for this encounter.   Reason COVID-19*  Social distancing based on CDC and AMA recommendations.   I contacted Lamonte Sakai on 12/02/2019 via telephone.      I clearly identified myself as Gaspar Cola, MD. I verified that I was speaking with the correct person using two identifiers (Name: SHELBIE FRANKEN, and date of birth: 05-24-1946).  Consent I sought verbal advanced consent from Lamonte Sakai for virtual visit interactions. I informed Ms.  Trillo of possible security and privacy concerns, risks, and limitations associated with providing "not-in-person" medical evaluation and management services. I also informed Ms. Demarais of the availability of "in-person" appointments. Finally, I informed her that there would be a charge for the virtual visit and that she could be  personally, fully or partially, financially responsible for it. Ms. Woolridge expressed understanding and agreed to proceed.   Historic Elements   [name] is a 74 y.o. year old, female patient evaluated today after her last contact with our practice on 11/20/2019. Ms. Hottle  has a past medical history of Glaucoma, Hyperlipidemia, and Hypertension. She also  has a past surgical history that includes Back surgery; Eye surgery; and Carpal tunnel release (Left). Ms. Coover has a current medication list which includes the following prescription(s): albuterol, aspirin, atorvastatin, diltiazem, dorzolamide, fluticasone-salmeterol, furosemide, hydrocodone-acetaminophen, [START ON 12/10/2019] hydrocodone-acetaminophen, [START ON 01/09/2020] hydrocodone-acetaminophen, latanoprost, magnesium gluconate, multi-vitamin, omeprazole, pregabalin, ramipril, ropinirole, spironolactone, vitamin b-12, and meloxicam. She  reports that she has been smoking cigarettes. She has a 50.00 pack-year smoking history. She has never used smokeless tobacco. She reports current alcohol use of about 35.0 standard drinks of alcohol per week. She reports that she does not use drugs. Ms. Lata is allergic to codeine; cyclobenzaprine; and penicillins.   HPI  Today, she is being contacted for a post-procedure assessment.  The patient indicates having done well after the lumbar facet and SI joint block on the left side.  However, on the right side she still having some pain.  She now thinks that she should have done both sides.  She is also concerned about the fact that when she stands up straight, the  pain gets worse, which is typical of lumbar spinal stenosis.  The last time that this patient had an MRI was in 2010 and a CT on 2018.  Since her symptoms have worsened, we will go ahead and repeat that CT scan since she had some prior surgery and has hardware in the area.  The patient was instructed to give [place] a call as soon as she completes the studies so that we can go over those results.  She understood and accepted.  Post-Procedure Evaluation  Procedure (11/19/2019): Diagnostic left lumbar facet block #2 + diagnostic left SI joint block #2, under fluoroscopic guidance and IV sedation Pre-procedure pain level:  4/10 Post-procedure: 3/10 (< 50% relief)  Sedation: Sedation provided.  [name], RN  11/29/2019  8:48 AM  Sign when Signing Visit Pain relief after procedure (treated area only): (Questions asked to patient) 1. Starting about 15 minutes after the procedure, and "while the area was still numb" (from the local anesthetics), were you having any of your usual pain "in that area" (the treated area)?  (NOTE: NOT including the discomfort from the needle sticks.) First 1 hour:50 % better. First 4-6 hours:50 % better. 2. How long did the numbness from the local anesthetics last? (More than 6 hours?) Duration:36 hours.  3. How much better is your pain now, when compared to before the procedure? Current benefit: 90 % better. 4. Can you move better now? Improvement in ROM (Range of Motion): Yes. 5. Can you do more now? Improvement in function: Yes. 4. Did you have any problems with the procedure? Side-effects/Complications: No.  Current benefits: Defined as benefit that persist at this time.   Analgesia:  90-100% better.  She indicates still having some discomfort across the lower back but it would seem that she is taking into account the pain on the right side, which we did not treat.  She also indicates that her pain is worse when she stands for a prolonged period time and also when  she attempts to straighten up.  This would suggest that we are dealing with a lumbar spinal stenosis which would not be abnormal at her age (74 years old).  The last time that she had an MRI was in 2010.  She last had a CT of the lumbar spine on 11/18/2016.  Because she does have some hardware from prior back surgery, we will go ahead and again order a CT to check on whether or not we are looking at some spinal stenosis. Function: Ms. Hoggard reports improvement in function ROM: Ms. Bari reports improvement in ROM  Pharmacotherapy Assessment  Analgesic: Hydrocodone/APAP 7.5/325, 1 tab PO q 6 hrs (30 mg/day of hydrocodone) MME/day:30 mg/day.   Monitoring: [place] PMP: PDMP reviewed during this encounter.       Pharmacotherapy: No side-effects or adverse reactions reported. Compliance: No problems identified. Effectiveness: Clinically acceptable. Plan: Refer to "POC".  UDS:  Summary  Date Value Ref Range Status  08/02/2018 FINAL  Final    Comment:    ==================================================================== TOXASSURE SELECT 13 (MW) ==================================================================== Test                             Result       Flag       Units Drug Present and Declared for Prescription Verification   Hydrocodone                    3379         EXPECTED   ng/mg  creat   Hydromorphone                  1755         EXPECTED   ng/mg creat   Dihydrocodeine                 230          EXPECTED   ng/mg creat   Norhydrocodone                 1521         EXPECTED   ng/mg creat    Sources of hydrocodone include scheduled prescription    medications. Hydromorphone, dihydrocodeine and norhydrocodone are    expected metabolites of hydrocodone. Hydromorphone and    dihydrocodeine are also available as scheduled prescription    medications. ==================================================================== Test                      Result    Flag   Units      Ref  Range   Creatinine              33               mg/dL      >=20 ==================================================================== Declared Medications:  The flagging and interpretation on this report are based on the  following declared medications.  Unexpected results may arise from  inaccuracies in the declared medications.  **Note: The testing scope of this panel includes these medications:  Hydrocodone (Norco)  **Note: The testing scope of this panel does not include following  reported medications:  Acetaminophen (Norco)  Albuterol  Aspirin  Atorvastatin (Lipitor)  Cyanocobalamin  Diltiazem (Cardizem)  Dorzolamide  Fluticasone (Advair)  Latanoprost (Xalatan)  Meloxicam (Mobic)  Omeprazole (Prilosec)  Potassium (Klor-Con)  Pregabalin (Lyrica)  Ramipril (Altace)  Ropinirole (Requip)  Salmeterol (Advair)  Spironolactone (Aldactone)  Vitamin D ==================================================================== For clinical consultation, please call [phone]. ====================================================================    Laboratory Chemistry Profile   Renal Lab Results  Component Value Date   BUN 7 (L) 01/09/2019   CREATININE 0.77 01/09/2019   BCR 9 (L) 01/09/2019   GFRAA 89 01/09/2019   GFRNONAA 77 01/09/2019    Hepatic Lab Results  Component Value Date   AST 21 01/09/2019   ALT 12 01/09/2019   ALBUMIN 4.1 01/09/2019   ALKPHOS 94 01/09/2019    Electrolytes Lab Results  Component Value Date   NA 138 01/09/2019   K 4.5 01/09/2019   CL 97 01/09/2019   CALCIUM 10.1 01/09/2019   MG 1.7 02/14/2017    Bone Lab Results  Component Value Date   VD25OH 31.8 01/01/2018    Coagulation Lab Results  Component Value Date   PLT 264 01/09/2019    Cardiovascular Lab Results  Component Value Date   BNP 78.2 06/14/2016   CKTOTAL 120 06/14/2016   HGB 14.8 01/09/2019   HCT 44.0 01/09/2019    Inflammation (CRP: Acute Phase) (ESR: Chronic  Phase) Lab Results  Component Value Date   CRP <0.8 02/14/2017   ESRSEDRATE 20 02/14/2017      Note: Above Lab results reviewed.  Imaging  DG PAIN CLINIC C-ARM 1-60 MIN NO REPORT Fluoro was used, but no Radiologist interpretation will be provided.  Please refer to "NOTES" tab for provider progress note.  Assessment  The primary encounter diagnosis was Chronic pain syndrome. Diagnoses of Chronic lower extremity pain (Primary Area of Pain) (Bilateral) (L>R), Chronic low back  pain (Secondary area of Pain) (Bilateral) (L>R), Chronic upper extremity pain (Third area of Pain) (Bilateral) (L>R), Other intervertebral disc degeneration, lumbar region, Lumbar Postlaminectomy syndrome (L4-5), Lumbar spondylosis, and Spinal stenosis of lumbar region with neurogenic claudication were also pertinent to this visit.  Plan of Care  Problem-specific:  No problem-specific Assessment & Plan notes found for this encounter.  I have discontinued Genni Buske. Mill's cholecalciferol, sertraline, and lubiprostone. I am also having her maintain her albuterol, aspirin, dorzolamide, Fluticasone-Salmeterol, latanoprost, vitamin B-12, meloxicam, omeprazole, ramipril, Multi-Vitamin, furosemide, pregabalin, diltiazem, spironolactone, atorvastatin, rOPINIRole, HYDROcodone-acetaminophen, HYDROcodone-acetaminophen, HYDROcodone-acetaminophen, and magnesium gluconate.  Pharmacotherapy (Medications Ordered): No orders of the defined types were placed in this encounter.  Orders:  Orders Placed This Encounter  Procedures  . CT LUMBAR SPINE WO CONTRAST    Patient presents with axial pain with possible radicular component.  In addition to any acute findings, please report on:  1. Facet (Zygapophyseal) joint DJD (Hypertrophy, space narrowing, subchondral sclerosis, and/or osteophyte formation) 2. DDD and/or IVDD (Loss of disc height, desiccation or "Black disc disease") 3. Pars defects 4. Spondylolisthesis, spondylosis,  and/or spondyloarthropathies (include Degree/Grade of displacement in mm) 5. Vertebral body Fractures, including age (old, new/acute) 16. Modic Type Changes 7. Demineralization 8. Bone pathology 9. Central, Lateral Recess, and/or Foraminal Stenosis (include AP diameter of stenosis in mm) 10. Surgical changes (hardware type, status, and presence of fibrosis)  NOTE: Please specify level(s) and laterality.    Standing Status:   Future    Standing Expiration Date:   02/29/2020    Order Specific Question:   Preferred imaging location?    Answer:   [facility]    Order Specific Question:   Call Results- Best Contact Number?    Answer:   [phone] ([facility])    Order Specific Question:   Radiology Contrast Protocol - do NOT remove file path    Answer:   [email or web address]   Follow-up plan:   Return for (VV), (s/p Tests) to review the results of the lumbar CT scan.      Considering:   Diagnostic bilateral IA hip joint injection  Possible bilateral hip joint RFA  Possible bilateral lumbar facet RFA  Possible left SI joint RFA  Diagnostic bilateral L4-L5 TFESI  Diagnostic caudal ESI  Diagnostic caudal epidurogram  Possible RACZ epidural lysis of adhesions  Diagnostic left L2-3 LESI  Possible bilateral Lumbar SCS trial implant    Palliative PRN treatment(s):   Diagnostic bilateral lumbar facet block #2  Diagnostic left SI joint block #2     Recent Visits Date Type Provider Dept  11/19/19 Procedure visit Milinda Pointer, MD [facility]  11/06/19 Telemedicine Milinda Pointer, MD [facility]  Showing recent visits within past 90 days and meeting all other requirements   Today's Visits Date Type Provider Dept  12/02/19 Telemedicine Milinda Pointer, MD [facility]  Showing today's visits and meeting all other requirements   Future Appointments Date Type Provider Dept  02/05/20 Appointment Milinda Pointer, MD [facility]  Showing future appointments within next 90 days and meeting all other requirements   I discussed the assessment and treatment plan with the patient. The patient was provided an opportunity to ask questions and all were answered. The patient agreed with the plan and demonstrated an understanding of the instructions.  Patient advised to call back or seek an in-person evaluation if the symptoms or condition worsens.  Duration of encounter: 15 minutes.  Note by: Gaspar Cola, MD  Date: 12/02/2019; Time: 9:49 AM

## 2019-12-02 ENCOUNTER — Ambulatory Visit: Payer: Medicare Other | Attending: Pain Medicine | Admitting: Pain Medicine

## 2019-12-02 ENCOUNTER — Other Ambulatory Visit: Payer: Self-pay

## 2019-12-02 ENCOUNTER — Telehealth: Payer: Self-pay | Admitting: *Deleted

## 2019-12-02 DIAGNOSIS — M79602 Pain in left arm: Secondary | ICD-10-CM

## 2019-12-02 DIAGNOSIS — M47816 Spondylosis without myelopathy or radiculopathy, lumbar region: Secondary | ICD-10-CM

## 2019-12-02 DIAGNOSIS — M79604 Pain in right leg: Secondary | ICD-10-CM

## 2019-12-02 DIAGNOSIS — M961 Postlaminectomy syndrome, not elsewhere classified: Secondary | ICD-10-CM

## 2019-12-02 DIAGNOSIS — M79601 Pain in right arm: Secondary | ICD-10-CM

## 2019-12-02 DIAGNOSIS — M48062 Spinal stenosis, lumbar region with neurogenic claudication: Secondary | ICD-10-CM

## 2019-12-02 DIAGNOSIS — M5442 Lumbago with sciatica, left side: Secondary | ICD-10-CM | POA: Diagnosis not present

## 2019-12-02 DIAGNOSIS — G894 Chronic pain syndrome: Secondary | ICD-10-CM

## 2019-12-02 DIAGNOSIS — M5441 Lumbago with sciatica, right side: Secondary | ICD-10-CM

## 2019-12-02 DIAGNOSIS — G8929 Other chronic pain: Secondary | ICD-10-CM

## 2019-12-02 DIAGNOSIS — M5136 Other intervertebral disc degeneration, lumbar region: Secondary | ICD-10-CM

## 2019-12-02 DIAGNOSIS — M79605 Pain in left leg: Secondary | ICD-10-CM

## 2019-12-11 ENCOUNTER — Other Ambulatory Visit: Payer: Self-pay

## 2019-12-11 ENCOUNTER — Ambulatory Visit
Admission: RE | Admit: 2019-12-11 | Discharge: 2019-12-11 | Disposition: A | Discharge status: Home / Self Care | Payer: Medicare Other | Source: Ambulatory Visit | Attending: Pain Medicine | Admitting: Pain Medicine

## 2019-12-11 DIAGNOSIS — M961 Postlaminectomy syndrome, not elsewhere classified: Secondary | ICD-10-CM | POA: Insufficient documentation

## 2019-12-11 DIAGNOSIS — M5442 Lumbago with sciatica, left side: Secondary | ICD-10-CM | POA: Insufficient documentation

## 2019-12-11 DIAGNOSIS — M79605 Pain in left leg: Secondary | ICD-10-CM | POA: Diagnosis present

## 2019-12-11 DIAGNOSIS — M48062 Spinal stenosis, lumbar region with neurogenic claudication: Secondary | ICD-10-CM | POA: Insufficient documentation

## 2019-12-11 DIAGNOSIS — M5136 Other intervertebral disc degeneration, lumbar region: Secondary | ICD-10-CM | POA: Diagnosis present

## 2019-12-11 DIAGNOSIS — M5441 Lumbago with sciatica, right side: Secondary | ICD-10-CM | POA: Diagnosis present

## 2019-12-11 DIAGNOSIS — M79604 Pain in right leg: Secondary | ICD-10-CM | POA: Diagnosis not present

## 2019-12-11 DIAGNOSIS — G8929 Other chronic pain: Secondary | ICD-10-CM | POA: Diagnosis present

## 2019-12-11 DIAGNOSIS — M47816 Spondylosis without myelopathy or radiculopathy, lumbar region: Secondary | ICD-10-CM

## 2020-01-03 NOTE — Progress Notes (Deleted)
Patient: Suzanne Avila, Female    DOB: 09-26-1946, 74 y.o.   MRN: [id number] Visit Date: 01/03/2020  Today's Provider: Mar Daring, PA-C   No chief complaint on file.  Subjective:     Annual wellness visit Suzanne Avila is a 74 y.o. female. She feels {DESC; WELL/FAIRLY WELL/POORLY:18703}. She reports exercising ***. She reports she is sleeping {DESC; WELL/FAIRLY WELL/POORLY:18703}.  -----------------------------------------------------------   Review of Systems  Social History   Socioeconomic History  . Marital status: Married    Spouse name: Not on file  . Number of children: 2  . Years of education: Not on file  . Highest education level: 12th grade  Occupational History  . Occupation: disabled  . Occupation: retired  Tobacco Use  . Smoking status: Current Every Day Smoker    Packs/day: 1.00    Years: 50.00    Pack years: 50.00    Types: Cigarettes  . Smokeless tobacco: Never Used  Substance and Sexual Activity  . Alcohol use: Yes    Alcohol/week: 35.0 standard drinks    Types: 35 Cans of beer per week  . Drug use: No  . Sexual activity: Not on file  Other Topics Concern  . Not on file  Social History Narrative  . Not on file   Social Determinants of Health   Financial Resource Strain:   . Difficulty of Paying Living Expenses:   Food Insecurity:   . Worried About Charity fundraiser in the Last Year:   . Arboriculturist in the Last Year:   Transportation Needs:   . Film/video editor (Medical):   Marland Kitchen Lack of Transportation (Non-Medical):   Physical Activity: Inactive  . Days of Exercise per Week: 0 days  . Minutes of Exercise per Session: 0 min  Stress:   . Feeling of Stress :   Social Connections: Unknown  . Frequency of Communication with Friends and Family: Patient refused  . Frequency of Social Gatherings with Friends and Family: Patient refused  . Attends Religious Services: Patient refused  . Active Member of Clubs or  Organizations: Patient refused  . Attends Archivist Meetings: Patient refused  . Marital Status: Patient refused  Intimate Partner Violence: Unknown  . Fear of Current or Ex-Partner: Patient refused  . Emotionally Abused: Patient refused  . Physically Abused: Patient refused  . Sexually Abused: Patient refused    Past Medical History:  Diagnosis Date  . Glaucoma   . Hyperlipidemia   . Hypertension      Patient Active Problem List   Diagnosis Date Noted  . DDD (degenerative disc disease), lumbosacral 11/19/2019  . Therapeutic opioid-induced constipation (OIC) 08/01/2019  . Chronic sacroiliac joint pain (Left) 05/01/2019  . Chronic groin pain (Left) 05/01/2019  . Spondylosis without myelopathy or radiculopathy, lumbosacral region 05/01/2019  . Other specified dorsopathies, sacral and sacrococcygeal region 05/01/2019  . Hyperlipidemia 01/01/2018  . Renal artery stenosis ([place]) 12/27/2017  . Pain in limb 12/22/2017  . Tobacco use disorder 12/22/2017  . Renal atrophy, right 12/22/2017  . Thoracic radiculitis 01/31/2017  . Chronic lower extremity pain (Primary Area of Pain) (Bilateral) (L>R) 04/25/2016  . Chronic upper extremity pain (Third area of Pain) (Bilateral) (L>R) 04/25/2016  . Disturbance of skin sensation 04/20/2016  . Neurogenic pain 04/20/2016  . Chronic hip pain (Bilateral) (L>R) 11/11/2015  . Vitamin D deficiency 11/11/2015  . Hypokalemia 09/02/2015  . Edema 09/02/2015  . 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 Encounter for therapeutic drug level monitoring 08/14/2015  . Long term current use of opiate analgesic 08/14/2015  . Long term prescription opiate use 08/14/2015  . Uncomplicated opioid dependence (HCC) 08/14/2015  . Opiate use (30 MME/Day) 08/14/2015  . Chronic low back pain (Secondary area of Pain) (Bilateral) (L>R) 08/14/2015  . Chronic pain syndrome 08/14/2015  . Alcoholic (HCC) 08/14/2015  . B12 deficiency 08/14/2015  .  Narrowing of intervertebral disc space 08/14/2015  . Encounter for screening for diabetes mellitus 08/14/2015  . History of metabolic disorder 08/14/2015  . Fungal infection of toenail 08/14/2015  . Osteopenia 08/14/2015  . Compulsive tobacco user syndrome 08/14/2015  . Osteoarthritis of hip (Left) 08/14/2015  . Chronic hip pain (Left) 08/14/2015  . Chronic lumbar radicular pain (L5 dermatome) (Bilateral) (L>R) 08/14/2015  . Lumbar facet syndrome (Bilateral) (L>R) 08/14/2015  . Lumbar spondylosis 08/14/2015  . 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 Lumbar Postlaminectomy syndrome (L4-5) 08/14/2015  . Fibromyalgia 08/14/2015  . Chronic airway obstruction (HCC) 04/21/2015  . Allergic rhinitis 02/10/2010  . Atrial paroxysmal tachycardia (HCC) 07/06/2009  . CAFL (chronic airflow limitation) (HCC) 07/06/2009  . Acid reflux 07/06/2009  . Glaucoma 07/06/2009  . Benign essential HTN 07/06/2009  . Hypercholesterolemia without hypertriglyceridemia 07/06/2009  . Absence of bladder continence 07/06/2009    Past Surgical History:  Procedure Laterality Date  . BACK SURGERY    . CARPAL TUNNEL RELEASE Left   . EYE SURGERY      Her family history includes Cancer in her father and mother.   Current Outpatient Medications:  .  albuterol (PROVENTIL HFA;VENTOLIN HFA) 108 (90 Base) MCG/ACT inhaler, ALBUTEROL, 90MCG/ACT (Inhalation Aerosol Solution)  2 PUFFS Every Day prn, Disp: 3 Inhaler, Rfl: 3 .  aspirin 325 MG tablet, Take 1 tablet (325 mg total) by mouth daily., Disp: 90 tablet, Rfl: 3 .  atorvastatin (LIPITOR) 40 MG tablet, Take 1 tablet (40 mg total) by mouth daily., Disp: 90 tablet, Rfl: 3 .  diltiazem (CARDIZEM) 120 MG tablet, Take 1 tablet (120 mg total) by mouth daily., Disp: 90 tablet, Rfl: 3 .  dorzolamide (TRUSOPT) 2 % ophthalmic solution, Place 1 drop into both eyes 3 (three) times daily. (Patient taking differently: Place 1 drop into both eyes 2 (two) times  daily. ), Disp: 10 mL, Rfl: 3 .  Fluticasone-Salmeterol (ADVAIR DISKUS) 250-50 MCG/DOSE AEPB, USE 1 INHALATION TWICE A DAY, Disp: 180 each, Rfl: 3 .  furosemide (LASIX) 20 MG tablet, Take 1 tablet (20 mg total) by mouth daily., Disp: 90 tablet, Rfl: 3 .  HYDROcodone-acetaminophen (NORCO) 7.5-325 MG tablet, Take 1 tablet by mouth every 6 (six) hours as needed for severe pain. Must last 30 days, Disp: 120 tablet, Rfl: 0 .  HYDROcodone-acetaminophen (NORCO) 7.5-325 MG tablet, Take 1 tablet by mouth every 6 (six) hours as needed for severe pain. Must last 30 days, Disp: 120 tablet, Rfl: 0 .  [START ON 01/09/2020] HYDROcodone-acetaminophen (NORCO) 7.5-325 MG tablet, Take 1 tablet by mouth every 6 (six) hours as needed for severe pain. Must last 30 days, Disp: 120 tablet, Rfl: 0 .  latanoprost (XALATAN) 0.005 % ophthalmic solution, Place 1 drop into both eyes at bedtime., Disp: 2.5 mL, Rfl: 3 .  magnesium gluconate (MAGONATE) 500 MG tablet, Take 500 mg by mouth daily., Disp: , Rfl:  .  meloxicam (MOBIC) 15 MG tablet, Take 1 tablet (15 mg total) by mouth daily., Disp: 90 tablet, Rfl: 3 .  Multiple Vitamin (MULTI-VITAMIN) tablet, Take by mouth., Disp: , Rfl:  .  omeprazole (PRILOSEC) 40 MG capsule, Take 1 capsule (40 mg total) by mouth daily., Disp: 90 capsule, Rfl: 3 .  pregabalin (LYRICA) 150 MG capsule, Take 1 capsule (150 mg total) by mouth every 8 (eight) hours., Disp: 270 capsule, Rfl: 1 .  ramipril (ALTACE) 10 MG capsule, Take 1 capsule (10 mg total) by mouth daily., Disp: 90 capsule, Rfl: 3 .  rOPINIRole (REQUIP) 2 MG tablet, Take 1 tablet (2 mg total) by mouth 2 (two) times daily., Disp: 180 tablet, Rfl: 1 .  spironolactone (ALDACTONE) 25 MG tablet, Take 1 tablet (25 mg total) by mouth daily., Disp: 90 tablet, Rfl: 3 .  vitamin B-12 (CYANOCOBALAMIN) 500 MCG tablet, Take 1 tablet (500 mcg total) by mouth daily., Disp: 90 tablet, Rfl: 3  Patient Care Team: Margaretann Loveless, PA-C as PCP - General  (Family Medicine) Wyn Quaker, Marlow Baars, MD as Referring Physician (Vascular Surgery) Delano Metz, MD as Referring Physician (Pain Medicine) Lockie Mola, MD as Referring Physician (Ophthalmology) Barbette Merino, NP as Nurse Practitioner (Nurse Practitioner) Ernest Pine Illene Labrador, MD (Orthopedic Surgery)    Objective:    Vitals: There were no vitals taken for this visit.  Physical Exam  Activities of Daily Living In your present state of health, do you have any difficulty performing the following activities: 01/03/2019  Hearing? N  Vision? Y  Comment Needs new eye glass prescription. Pt plans to set up eye exam this year.   Difficulty concentrating or making decisions? N  Walking or climbing stairs? Y  Comment Due to back and leg pains.  Dressing or bathing? N  Doing errands, shopping? N  Preparing Food and eating ? N  Using the Toilet? N  In the past six months, have you accidently leaked urine? N  Do you have problems with loss of bowel control? N  Managing your Medications? N  Managing your Finances? N  Housekeeping or managing your Housekeeping? N  Some recent data might be hidden    Fall Risk Assessment Fall Risk  11/19/2019 06/27/2019 01/03/2019 10/31/2018 08/02/2018  Falls in the past year? 0 0 1 1 Yes  Number falls in past yr: - - 0 - 1  Injury with Fall? - - 0 - No  Comment - - - - -  Follow up - - Falls prevention discussed - -     Depression Screen PHQ 2/9 Scores 06/27/2019 01/03/2019 08/02/2018 05/02/2018  PHQ - 2 Score 0 2 0 0  PHQ- 9 Score - 12 - -    6CIT Screen 01/03/2019  What Year? 0 points  What month? 0 points  What time? 0 points  Count back from 20 0 points  Months in reverse 2 points  Repeat phrase 4 points  Total Score 6      Assessment & Plan:     Annual Wellness Visit  Reviewed patient's Family Medical History Reviewed and updated list of patient's medical providers Assessment of cognitive impairment was done Assessed patient's  functional ability Established a written schedule for health screening services Health Risk Assessent Completed and Reviewed  Exercise Activities and Dietary recommendations Goals    . Prevent falls     Recommend to remove any items from the home that may cause slips or trips.    . Quit Smoking     Recommend to continue efforts to reduce smoking habits until no longer smoking (Smoking Cessation literature attached to AVS).      Marland Kitchen  Reduce alcohol intake     Recommend cutting out all alcohol. Pt declined, recommend cutting consumption in half.        Immunization History  Administered Date(s) Administered  . Fluad Quad(high Dose 65+) 08/02/2019  . Influenza Split 07/14/2010, 08/16/2011  . Influenza, High Dose Seasonal PF 09/02/2017, 09/27/2018  . Influenza,inj,Quad PF,6+ Mos 07/09/2014  . Pneumococcal Conjugate-13 01/09/2014  . Pneumococcal Polysaccharide-23 12/01/2009, 07/24/2016  . Zoster 05/23/2014  . Zoster Recombinat (Shingrix) 09/02/2017, 01/09/2019, 08/02/2019    Health Maintenance  Topic Date Due  . MAMMOGRAM  10/20/2019  . COLONOSCOPY  01/09/2020 (Originally 01/30/2016)  . TETANUS/TDAP  01/09/2020 (Originally 02/21/1964)  . DEXA SCAN  10/19/2020  . INFLUENZA VACCINE  Completed  . Hepatitis C Screening  Completed  . PNA vac Low Risk Adult  Completed     Discussed health benefits of physical activity, and encouraged her to engage in regular exercise appropriate for her age and condition.    ------------------------------------------------------------------------------------------------------------    Margaretann Loveless, PA-C  [facility]

## 2020-01-06 ENCOUNTER — Encounter: Payer: Self-pay | Admitting: Physician Assistant

## 2020-01-06 ENCOUNTER — Other Ambulatory Visit: Payer: Self-pay | Admitting: Physician Assistant

## 2020-01-06 DIAGNOSIS — E7800 Pure hypercholesterolemia, unspecified: Secondary | ICD-10-CM

## 2020-01-06 DIAGNOSIS — I701 Atherosclerosis of renal artery: Secondary | ICD-10-CM

## 2020-01-06 DIAGNOSIS — I10 Essential (primary) hypertension: Secondary | ICD-10-CM

## 2020-01-06 MED ORDER — ATORVASTATIN CALCIUM 40 MG PO TABS: 40.0000 mg | ORAL_TABLET | Freq: Every day | ORAL | 1 refills | Status: DC

## 2020-01-06 MED ORDER — DILTIAZEM HCL 120 MG PO TABS: 120.0000 mg | ORAL_TABLET | Freq: Every day | ORAL | 1 refills | Status: DC

## 2020-01-06 MED ORDER — SPIRONOLACTONE 25 MG PO TABS: 25.0000 mg | ORAL_TABLET | Freq: Every day | ORAL | 1 refills | Status: DC

## 2020-01-06 NOTE — Telephone Encounter (Signed)
Copied from CRM [id number]. Topic: Quick Communication - Rx Refill/Question >> Jan 06, 2020  9:37 AM Dalphine Handing A wrote: Medication:spironolactone (ALDACTONE) 25 MG tablet ,diltiazem (CARDIZEM) 120 MG tablet,atorvastatin (LIPITOR) 40 MG tablet  Has the patient contacted their pharmacy?Yes (Agent: If no, request that the patient contact the pharmacy for the refill.) (Agent: If yes, when and what did the pharmacy advise?)Contact PCP  Preferred Pharmacy (with phone number or street name): [facility] - [place], [place] - [address]  Phone:  [phone] Fax:  [phone]     Agent: Please be advised that RX refills may take up to 3 business days. We ask that you follow-up with your pharmacy.

## 2020-01-21 NOTE — Progress Notes (Signed)
Subjective:   Suzanne Avila is a 74 y.o. female who presents for Medicare Annual (Subsequent) preventive examination.    This visit is being conducted through telemedicine due to the COVID-19 pandemic. This patient has given me verbal consent via doximity to conduct this visit, patient states they are participating from their home address. Some vital signs may be absent or patient reported.    Patient identification: identified by name, DOB, and current address  Review of Systems:  N/A  Cardiac Risk Factors include: advanced age (>26men, >58 women);dyslipidemia;hypertension;smoking/ tobacco exposure;sedentary lifestyle     Objective:     Vitals: There were no vitals taken for this visit.  There is no height or weight on file to calculate BMI. Unable to obtain vitals due to visit being conducted via telephonically.   Advanced Directives 01/22/2020 11/19/2019 06/27/2019 01/03/2019 08/02/2018 05/02/2018 01/31/2018  Does Patient Have a Medical Advance Directive? No No No No No No No  Would patient like information on creating a medical advance directive? No - Patient declined - - No - Patient declined Yes (MAU/Ambulatory/Procedural Areas - Information given) No - Patient declined No - Patient declined    Tobacco Social History   Tobacco Use  Smoking Status Current Every Day Smoker  . Packs/day: 1.00  . Years: 50.00  . Pack years: 50.00  . Types: Cigarettes  Smokeless Tobacco Never Used     Ready to quit: No Counseling given: No   Clinical Intake:  Pre-visit preparation completed: Yes  Pain : No/denies pain Pain Score: 0-No pain     Nutritional Risks: None Diabetes: No  How often do you need to have someone help you when you read instructions, pamphlets, or other written materials from your doctor or pharmacy?: 1 - Never  Interpreter Needed?: No  Information entered by :: [facility], LPN  Past Medical History:  Diagnosis Date  . Glaucoma   . Hyperlipidemia   .  Hypertension    Past Surgical History:  Procedure Laterality Date  . BACK SURGERY    . CARPAL TUNNEL RELEASE Left   . EYE SURGERY     Family History  Problem Relation Age of Onset  . Cancer Mother   . Cancer Father    Social History   Socioeconomic History  . Marital status: Married    Spouse name: Not on file  . Number of children: 2  . Years of education: Not on file  . Highest education level: 12th grade  Occupational History  . Occupation: disabled  . Occupation: retired  Tobacco Use  . Smoking status: Current Every Day Smoker    Packs/day: 1.00    Years: 50.00    Pack years: 50.00    Types: Cigarettes  . Smokeless tobacco: Never Used  Substance and Sexual Activity  . Alcohol use: Yes    Alcohol/week: 35.0 standard drinks    Types: 35 Cans of beer per week    Comment: 4-5 beers every day  . Drug use: No  . Sexual activity: Not on file  Other Topics Concern  . Not on file  Social History Narrative  . Not on file   Social Determinants of Health   Financial Resource Strain: Low Risk   . Difficulty of Paying Living Expenses: Not hard at all  Food Insecurity: No Food Insecurity  . Worried About Programme researcher, broadcasting/film/video in the Last Year: Never true  . Ran Out of Food in the Last Year: Never true  Transportation Needs: No  Transportation Needs  . Lack of Transportation (Medical): No  . Lack of Transportation (Non-Medical): No  Physical Activity: Inactive  . Days of Exercise per Week: 0 days  . Minutes of Exercise per Session: 0 min  Stress: No Stress Concern Present  . Feeling of Stress : Only a little  Social Connections: Somewhat Isolated  . Frequency of Communication with Friends and Family: Three times a week  . Frequency of Social Gatherings with Friends and Family: Once a week  . Attends Religious Services: Never  . Active Member of Clubs or Organizations: No  . Attends Banker Meetings: Never  . Marital Status: Married    Outpatient  Encounter Medications as of 01/22/2020  Medication Sig  . albuterol (PROVENTIL HFA;VENTOLIN HFA) 108 (90 Base) MCG/ACT inhaler ALBUTEROL, 90MCG/ACT (Inhalation Aerosol Solution)  2 PUFFS Every Day prn  . aspirin 325 MG tablet Take 1 tablet (325 mg total) by mouth daily.  Marland Kitchen atorvastatin (LIPITOR) 40 MG tablet Take 1 tablet (40 mg total) by mouth daily.  Marland Kitchen diltiazem (CARDIZEM) 120 MG tablet Take 1 tablet (120 mg total) by mouth daily.  . dorzolamide (TRUSOPT) 2 % ophthalmic solution Place 1 drop into both eyes 3 (three) times daily. (Patient taking differently: Place 1 drop into both eyes 2 (two) times daily. )  . Fluticasone-Salmeterol (ADVAIR DISKUS) 250-50 MCG/DOSE AEPB USE 1 INHALATION TWICE A DAY  . furosemide (LASIX) 20 MG tablet Take 1 tablet (20 mg total) by mouth daily.  Marland Kitchen HYDROcodone-acetaminophen (NORCO) 7.5-325 MG tablet Take 1 tablet by mouth every 6 (six) hours as needed for severe pain. Must last 30 days  . latanoprost (XALATAN) 0.005 % ophthalmic solution Place 1 drop into both eyes at bedtime.  . magnesium gluconate (MAGONATE) 500 MG tablet Take 500 mg by mouth daily.  . meloxicam (MOBIC) 15 MG tablet Take 1 tablet (15 mg total) by mouth daily.  . Multiple Vitamin (MULTI-VITAMIN) tablet Take 1 tablet by mouth daily.   Marland Kitchen omeprazole (PRILOSEC) 40 MG capsule Take 1 capsule (40 mg total) by mouth daily.  . pregabalin (LYRICA) 150 MG capsule Take 1 capsule (150 mg total) by mouth every 8 (eight) hours.  . ramipril (ALTACE) 10 MG capsule Take 1 capsule (10 mg total) by mouth daily.  Marland Kitchen rOPINIRole (REQUIP) 2 MG tablet Take 1 tablet (2 mg total) by mouth 2 (two) times daily.  Marland Kitchen spironolactone (ALDACTONE) 25 MG tablet Take 1 tablet (25 mg total) by mouth daily.  . vitamin B-12 (CYANOCOBALAMIN) 500 MCG tablet Take 1 tablet (500 mcg total) by mouth daily.  Marland Kitchen HYDROcodone-acetaminophen (NORCO) 7.5-325 MG tablet Take 1 tablet by mouth every 6 (six) hours as needed for severe pain. Must last 30  days  . HYDROcodone-acetaminophen (NORCO) 7.5-325 MG tablet Take 1 tablet by mouth every 6 (six) hours as needed for severe pain. Must last 30 days   No facility-administered encounter medications on file as of 01/22/2020.    Activities of Daily Living In your present state of health, do you have any difficulty performing the following activities: 01/22/2020  Hearing? N  Vision? Y  Comment Just received a new eye glass prescription however has not picked up the glasses yet.  Difficulty concentrating or making decisions? N  Walking or climbing stairs? Y  Comment Due to SOB.  Dressing or bathing? N  Doing errands, shopping? N  Preparing Food and eating ? N  Using the Toilet? N  In the past six months, have you accidently  leaked urine? Y  Comment Occasionally when coughing.  Do you have problems with loss of bowel control? N  Managing your Medications? N  Managing your Finances? N  Housekeeping or managing your Housekeeping? N  Some recent data might be hidden    Patient Care Team: Margaretann Loveless, PA-C as PCP - General (Family Medicine) Wyn Quaker Marlow Baars, MD as Referring Physician (Vascular Surgery) Delano Metz, MD as Referring Physician (Pain Medicine) Lockie Mola, MD as Referring Physician (Ophthalmology)    Assessment:   This is a routine wellness examination for Suzanne Avila.  Exercise Activities and Dietary recommendations Current Exercise Habits: The patient does not participate in regular exercise at present, Exercise limited by: respiratory conditions(s)  Goals    . Quit Smoking     Recommend to continue efforts to reduce smoking habits until no longer smoking (Smoking Cessation literature attached to AVS).      . Reduce alcohol intake     Recommend cutting out all alcohol. Pt declined, recommend cutting consumption in half.        Fall Risk: Fall Risk  01/22/2020 11/19/2019 06/27/2019 01/03/2019 10/31/2018  Falls in the past year? 0 0 0 1 1  Number falls  in past yr: 0 - - 0 -  Injury with Fall? 0 - - 0 -  Comment - - - - -  Follow up - - - Falls prevention discussed -    FALL RISK PREVENTION PERTAINING TO THE HOME:  Any stairs in or around the home? No  If so, are there any without handrails? N/A  Home free of loose throw rugs in walkways, pet beds, electrical cords, etc? Yes  Adequate lighting in your home to reduce risk of falls? Yes   ASSISTIVE DEVICES UTILIZED TO PREVENT FALLS:  Life alert? No  Use of a cane, walker or w/c? Yes  Grab bars in the bathroom? Yes  Shower chair or bench in shower? Yes  Elevated toilet seat or a handicapped toilet? Yes    TIMED UP AND GO:  Was the test performed? No .    Depression Screen PHQ 2/9 Scores 01/22/2020 06/27/2019 01/03/2019 08/02/2018  PHQ - 2 Score 0 0 2 0  PHQ- 9 Score - - 12 -     Cognitive Function: Declined today.       6CIT Screen 01/03/2019 01/01/2018  What Year? 0 points 0 points  What month? 0 points 0 points  What time? 0 points 0 points  Count back from 20 0 points 0 points  Months in reverse 2 points 2 points  Repeat phrase 4 points 2 points  Total Score 6 4    Immunization History  Administered Date(s) Administered  . Fluad Quad(high Dose 65+) 08/02/2019  . Influenza Split 07/14/2010, 08/16/2011  . Influenza, High Dose Seasonal PF 09/02/2017, 09/27/2018  . Influenza,inj,Quad PF,6+ Mos 07/09/2014  . Pneumococcal Conjugate-13 01/09/2014  . Pneumococcal Polysaccharide-23 12/01/2009, 07/24/2016  . Zoster 05/23/2014  . Zoster Recombinat (Shingrix) 09/02/2017, 01/09/2019, 08/02/2019    Qualifies for Shingles Vaccine? Completed series  Tdap: Although this vaccine is not a covered service during a Wellness Exam, does the patient still wish to receive this vaccine today?  No . Advised may receive this vaccine at local pharmacy or Health Dept. Aware to provide a copy of the vaccination record if obtained from local pharmacy or Health Dept. Verbalized acceptance and  understanding.  Flu Vaccine: Up to date  Pneumococcal Vaccine: Completed series  Screening Tests Health Maintenance  Topic  Date Due  . COLONOSCOPY  01/30/2016  . MAMMOGRAM  10/20/2019  . TETANUS/TDAP  01/21/2021 (Originally 02/21/1964)  . DEXA SCAN  10/19/2020  . INFLUENZA VACCINE  Completed  . Hepatitis C Screening  Completed  . PNA vac Low Risk Adult  Completed    Cancer Screenings:  Colorectal Screening: Completed 01/30/11. Repeat every 5 years. Declined referral and cologuard order today. Pt to speak with PCP about further options.  Mammogram: Completed 10/19/18. Repeat every year as advised. Ordered today. Pt provided with contact info and advised to call to schedule appt.   Bone Density: Completed 10/19/18. Results reflect OSTEOPOROSIS. Repeat every 2 years.   Lung Cancer Screening: (Low Dose CT Chest recommended if Age 43-80 years, 30 pack-year currently smoking OR have quit w/in 15years.) does qualify however had this completed 07/19/19. Repeat yearly.  Additional Screening:  Hepatitis C Screening: Up to date  Vision Screening: Recommended annual ophthalmology exams for early detection of glaucoma and other disorders of the eye.  Dental Screening: Recommended annual dental exams for proper oral hygiene  Community Resource Referral:  CRR required this visit?  No       Plan:  I have personally reviewed and addressed the Medicare Annual Wellness questionnaire and have noted the following in the patient's chart:  A. Medical and social history B. Use of alcohol, tobacco or illicit drugs  C. Current medications and supplements D. Functional ability and status E.  Nutritional status F.  Physical activity G. Advance directives H. List of other physicians I.  Hospitalizations, surgeries, and ER visits in previous 12 months J.  [place] such as hearing and vision if needed, cognitive and depression L. Referrals and appointments   In addition, I have  reviewed and discussed with patient certain preventive protocols, quality metrics, and best practice recommendations. A written personalized care plan for preventive services as well as general preventive health recommendations were provided to patient. Nurse Health Advisor  Signed,    Willetta York [place], [place]  6/43/3295 Nurse Health Advisor   Nurse Notes: Pt declined a colonoscopy referral or Cologuard order today. Pt to speak with PCP about this further at next in office apt on 02/03/20.

## 2020-01-22 ENCOUNTER — Other Ambulatory Visit: Payer: Self-pay

## 2020-01-22 ENCOUNTER — Ambulatory Visit (INDEPENDENT_AMBULATORY_CARE_PROVIDER_SITE_OTHER): Payer: Medicare Other

## 2020-01-22 DIAGNOSIS — Z0000 Encounter for general adult medical examination without abnormal findings: Secondary | ICD-10-CM | POA: Diagnosis not present

## 2020-01-22 DIAGNOSIS — Z1231 Encounter for screening mammogram for malignant neoplasm of breast: Secondary | ICD-10-CM | POA: Diagnosis not present

## 2020-01-22 NOTE — Patient Instructions (Signed)
Suzanne Avila , Thank you for taking time to come for your Medicare Wellness Visit. I appreciate your ongoing commitment to your health goals. Please review the following plan we discussed and let me know if I can assist you in the future.   Screening recommendations/referrals: Colonoscopy: Currently due. Declined referral or Cologuard order today. Will follow up with PCP about this at next in office apt. Mammogram: Ordered today. Pt provided with contact info and advised to call to schedule appt.  Bone Density: Up to date, due 09/2020 Recommended yearly ophthalmology/optometry visit for glaucoma screening and checkup Recommended yearly dental visit for hygiene and checkup  Vaccinations: Influenza vaccine: Up to date Pneumococcal vaccine: Completed series Tdap vaccine: Pt declines today.  Shingles vaccine: Completed series    Advanced directives: Please bring a copy of your POA (Power of Attorney) and/or Living Will to the clinic once completed.   Conditions/risks identified: Smoking cessation and decreasing alcohol consumption discussed today. Pt declined any lifestyle changes.   Next appointment: 02/03/20 @ 10:00 AM with Joycelyn Man    Preventive Care 65 Years and Older, Female Preventive care refers to lifestyle choices and visits with your health care provider that can promote health and wellness. What does preventive care include?  A yearly physical exam. This is also called an annual well check.  Dental exams once or twice a year.  Routine eye exams. Ask your health care provider how often you should have your eyes checked.  Personal lifestyle choices, including:  Daily care of your teeth and gums.  Regular physical activity.  Eating a healthy diet.  Avoiding tobacco and drug use.  Limiting alcohol use.  Practicing safe sex.  Taking low-dose aspirin every day.  Taking vitamin and mineral supplements as recommended by your health care provider. What happens  during an annual well check? The services and screenings done by your health care provider during your annual well check will depend on your age, overall health, lifestyle risk factors, and family history of disease. Counseling  Your health care provider may ask you questions about your:  Alcohol use.  Tobacco use.  Drug use.  Emotional well-being.  Home and relationship well-being.  Sexual activity.  Eating habits.  History of falls.  Memory and ability to understand (cognition).  Work and work Astronomer.  Reproductive health. Screening  You may have the following tests or measurements:  Height, weight, and BMI.  Blood pressure.  Lipid and cholesterol levels. These may be checked every 5 years, or more frequently if you are over 28 years old.  Skin check.  Lung cancer screening. You may have this screening every year starting at age 44 if you have a 30-pack-year history of smoking and currently smoke or have quit within the past 15 years.  Fecal occult blood test (FOBT) of the stool. You may have this test every year starting at age 18.  Flexible sigmoidoscopy or colonoscopy. You may have a sigmoidoscopy every 5 years or a colonoscopy every 10 years starting at age 44.  Hepatitis C blood test.  Hepatitis B blood test.  Sexually transmitted disease (STD) testing.  Diabetes screening. This is done by checking your blood sugar (glucose) after you have not eaten for a while (fasting). You may have this done every 1-3 years.  Bone density scan. This is done to screen for osteoporosis. You may have this done starting at age 43.  Mammogram. This may be done every 1-2 years. Talk to your health care provider about  how often you should have regular mammograms. Talk with your health care provider about your test results, treatment options, and if necessary, the need for more tests. Vaccines  Your health care provider may recommend certain vaccines, such  as:  Influenza vaccine. This is recommended every year.  Tetanus, diphtheria, and acellular pertussis (Tdap, Td) vaccine. You may need a Td booster every 10 years.  Zoster vaccine. You may need this after age 48.  Pneumococcal 13-valent conjugate (PCV13) vaccine. One dose is recommended after age 48.  Pneumococcal polysaccharide (PPSV23) vaccine. One dose is recommended after age 33. Talk to your health care provider about which screenings and vaccines you need and how often you need them. This information is not intended to replace advice given to you by your health care provider. Make sure you discuss any questions you have with your health care provider. Document Released: 11/06/2015 Document Revised: 06/29/2016 Document Reviewed: 08/11/2015 Elsevier Interactive Patient Education  2017 [place] Prevention in the Home Falls can cause injuries. They can happen to people of all ages. There are many things you can do to make your home safe and to help prevent falls. What can I do on the outside of my home?  Regularly fix the edges of walkways and driveways and fix any cracks.  Remove anything that might make you trip as you walk through a door, such as a raised step or threshold.  Trim any bushes or trees on the path to your home.  Use bright outdoor lighting.  Clear any walking paths of anything that might make someone trip, such as rocks or tools.  Regularly check to see if handrails are loose or broken. Make sure that both sides of any steps have handrails.  Any raised decks and porches should have guardrails on the edges.  Have any leaves, snow, or ice cleared regularly.  Use sand or salt on walking paths during winter.  Clean up any spills in your garage right away. This includes oil or grease spills. What can I do in the bathroom?  Use night lights.  Install grab bars by the toilet and in the tub and shower. Do not use towel bars as grab bars.  Use  non-skid mats or decals in the tub or shower.  If you need to sit down in the shower, use a plastic, non-slip stool.  Keep the floor dry. Clean up any water that spills on the floor as soon as it happens.  Remove soap buildup in the tub or shower regularly.  Attach bath mats securely with double-sided non-slip rug tape.  Do not have throw rugs and other things on the floor that can make you trip. What can I do in the bedroom?  Use night lights.  Make sure that you have a light by your bed that is easy to reach.  Do not use any sheets or blankets that are too big for your bed. They should not hang down onto the floor.  Have a firm chair that has side arms. You can use this for support while you get dressed.  Do not have throw rugs and other things on the floor that can make you trip. What can I do in the kitchen?  Clean up any spills right away.  Avoid walking on wet floors.  Keep items that you use a lot in easy-to-reach places.  If you need to reach something above you, use a strong step stool that has a grab bar.  Keep  electrical cords out of the way.  Do not use floor polish or wax that makes floors slippery. If you must use wax, use non-skid floor wax.  Do not have throw rugs and other things on the floor that can make you trip. What can I do with my stairs?  Do not leave any items on the stairs.  Make sure that there are handrails on both sides of the stairs and use them. Fix handrails that are broken or loose. Make sure that handrails are as long as the stairways.  Check any carpeting to make sure that it is firmly attached to the stairs. Fix any carpet that is loose or worn.  Avoid having throw rugs at the top or bottom of the stairs. If you do have throw rugs, attach them to the floor with carpet tape.  Make sure that you have a light switch at the top of the stairs and the bottom of the stairs. If you do not have them, ask someone to add them for you. What  else can I do to help prevent falls?  Wear shoes that:  Do not have high heels.  Have rubber bottoms.  Are comfortable and fit you well.  Are closed at the toe. Do not wear sandals.  If you use a stepladder:  Make sure that it is fully opened. Do not climb a closed stepladder.  Make sure that both sides of the stepladder are locked into place.  Ask someone to hold it for you, if possible.  Clearly mark and make sure that you can see:  Any grab bars or handrails.  First and last steps.  Where the edge of each step is.  Use tools that help you move around (mobility aids) if they are needed. These include:  Canes.  Walkers.  Scooters.  Crutches.  Turn on the lights when you go into a dark area. Replace any light bulbs as soon as they burn out.  Set up your furniture so you have a clear path. Avoid moving your furniture around.  If any of your floors are uneven, fix them.  If there are any pets around you, be aware of where they are.  Review your medicines with your doctor. Some medicines can make you feel dizzy. This can increase your chance of falling. Ask your doctor what other things that you can do to help prevent falls. This information is not intended to replace advice given to you by your health care provider. Make sure you discuss any questions you have with your health care provider. Document Released: 08/06/2009 Document Revised: 03/17/2016 Document Reviewed: 11/14/2014 Elsevier Interactive Patient Education  2017 [employer].

## 2020-01-28 ENCOUNTER — Telehealth: Payer: Self-pay

## 2020-01-28 NOTE — Progress Notes (Signed)
Patient: Suzanne Avila  Service Category: E/M  Provider: Gaspar Cola, MD  DOB: [birth year]  DOS: 01/29/2020  Location: Office  MRN: [id number]  Setting: Ambulatory outpatient  Referring Provider: Florian Buff*  Type: Established Patient  Specialty: Interventional Pain Management  PCP: Mar Daring, PA-C  Location: Remote location  Delivery: TeleHealth     Virtual Encounter - Pain Management PROVIDER NOTE: Information contained herein reflects review and annotations entered in association with encounter. Interpretation of such information and data should be left to medically-trained personnel. Information provided to patient can be located elsewhere in the medical record under "Patient Instructions". Document created using STT-dictation technology, any transcriptional errors that may result from process are unintentional.    Contact & Pharmacy Preferred: [phone] Home: [phone] (home) Mobile: [phone] (mobile) E-mail: No e-mail address on record  [facility] [place] [facility] [place] [address] [place] [address] Phone: [phone] Fax: [phone]   Pre-screening  Suzanne Avila offered "in-person" vs "virtual" encounter. She indicated preferring virtual for this encounter.   Reason COVID-19*  Social distancing based on CDC and AMA recommendations.   I contacted Suzanne Avila on 01/29/2020 via telephone.      I clearly identified myself as Gaspar Cola, MD. I verified that I was speaking with the correct person using two identifiers (Name: Suzanne Avila, and date of birth: 02-02-1946).  Consent I sought verbal advanced consent from Suzanne Avila for virtual visit interactions. I informed Suzanne Avila of possible security and privacy concerns, risks, and limitations associated with providing "not-in-person" medical evaluation and management services. I also informed Suzanne Avila of the availability of  "in-person" appointments. Finally, I informed her that there would be a charge for the virtual visit and that she could be  personally, fully or partially, financially responsible for it. Suzanne Avila expressed understanding and agreed to proceed.   Historic Elements   Suzanne Avila is a 74 y.o. year old, female patient evaluated today after her last contact with our practice on 12/02/2019. Suzanne Avila  has a past medical history of Glaucoma, Hyperlipidemia, and Hypertension. She also  has a past surgical history that includes Back surgery; Eye surgery; and Carpal tunnel release (Left). Suzanne Avila has a current medication list which includes the following prescription(s): albuterol, aspirin, atorvastatin, diltiazem, dorzolamide, fluticasone-salmeterol, furosemide, [START ON 02/08/2020] hydrocodone-acetaminophen, [START ON 03/09/2020] hydrocodone-acetaminophen, [START ON 04/08/2020] hydrocodone-acetaminophen, latanoprost, magnesium gluconate, multi-vitamin, omeprazole, [START ON 02/08/2020] pregabalin, ramipril, ropinirole, spironolactone, vitamin b-12, and meloxicam. She  reports that she has been smoking cigarettes. She has a 50.00 pack-year smoking history. She has never used smokeless tobacco. She reports current alcohol use of about 35.0 standard drinks of alcohol per week. She reports that she does not use drugs. Suzanne Avila is allergic to codeine; cyclobenzaprine; and penicillins.   HPI  Today, she is being contacted for medication management. The patient indicates doing well with the current medication regimen. No adverse reactions or side effects reported to the medications.  Today we have switched the patient to the [place] in [place], from her [place] secondary to [facility] unreliability in terms of securing enough opioid analgesics for our community.  Pharmacotherapy Assessment  Analgesic: Hydrocodone/APAP 7.5/325, 1 tab PO q 6 hrs (30 mg/day of  hydrocodone) MME/day:30 mg/day.   Monitoring: [place] PMP: PDMP reviewed during this encounter.       Pharmacotherapy: No side-effects or adverse reactions reported. Compliance: No problems identified. Effectiveness: Clinically  acceptable. Plan: Refer to "POC".  UDS:  Summary  Date Value Ref Range Status  08/02/2018 FINAL  Final    Comment:    ==================================================================== TOXASSURE SELECT 13 (MW) ==================================================================== Test                             Result       Flag       Units Drug Present and Declared for Prescription Verification   Hydrocodone                    3379         EXPECTED   ng/mg creat   Hydromorphone                  1755         EXPECTED   ng/mg creat   Dihydrocodeine                 230          EXPECTED   ng/mg creat   Norhydrocodone                 1521         EXPECTED   ng/mg creat    Sources of hydrocodone include scheduled prescription    medications. Hydromorphone, dihydrocodeine and norhydrocodone are    expected metabolites of hydrocodone. Hydromorphone and    dihydrocodeine are also available as scheduled prescription    medications. ==================================================================== Test                      Result    Flag   Units      Ref Range   Creatinine              33               mg/dL      >=20 ==================================================================== Declared Medications:  The flagging and interpretation on this report are based on the  following declared medications.  Unexpected results may arise from  inaccuracies in the declared medications.  **Note: The testing scope of this panel includes these medications:  Hydrocodone (Norco)  **Note: The testing scope of this panel does not include following  reported medications:  Acetaminophen (Norco)  Albuterol  Aspirin  Atorvastatin (Lipitor)  Cyanocobalamin  Diltiazem  (Cardizem)  Dorzolamide  Fluticasone (Advair)  Latanoprost (Xalatan)  Meloxicam (Mobic)  Omeprazole (Prilosec)  Potassium (Klor-Con)  Pregabalin (Lyrica)  Ramipril (Altace)  Ropinirole (Requip)  Salmeterol (Advair)  Spironolactone (Aldactone)  Vitamin D ==================================================================== For clinical consultation, please call [phone]. ====================================================================    Laboratory Chemistry Profile   Renal Lab Results  Component Value Date   BUN 7 (L) 01/09/2019   CREATININE 0.77 01/09/2019   BCR 9 (L) 01/09/2019   GFRAA 89 01/09/2019   GFRNONAA 77 01/09/2019     Hepatic Lab Results  Component Value Date   AST 21 01/09/2019   ALT 12 01/09/2019   ALBUMIN 4.1 01/09/2019   ALKPHOS 94 01/09/2019     Electrolytes Lab Results  Component Value Date   NA 138 01/09/2019   K 4.5 01/09/2019   CL 97 01/09/2019   CALCIUM 10.1 01/09/2019   MG 1.7 02/14/2017     Bone Lab Results  Component Value Date   VD25OH 31.8 01/01/2018     Inflammation (CRP: Acute Phase) (ESR: Chronic Phase) Lab Results  Component Value Date  CRP <0.8 02/14/2017   ESRSEDRATE 20 02/14/2017       Note: Above Lab results reviewed.  Imaging  CT LUMBAR SPINE WO CONTRAST CLINICAL DATA:  Osteoarthritis.  Lumbosacral back pain.  EXAM: CT LUMBAR SPINE WITHOUT CONTRAST  TECHNIQUE: Multidetector CT imaging of the lumbar spine was performed without intravenous contrast administration. Multiplanar CT image reconstructions were also generated.  COMPARISON:  Radiography 10/31/2018.  CT 11/18/2016.  FINDINGS: Segmentation: 5 lumbar type vertebral bodies as numbered previously.  Alignment: Scoliotic curvature convex to the right with the apex at L3.  Vertebrae: Discogenic sclerotic changes of the L2 and L3 vertebrae, progressive since 2018, particularly at L2.  Paraspinal and other soft tissues: Negative other than  aortic atherosclerosis. Chronic atrophic changes the right kidney.  Disc levels: No significant finding at L1-2 or above. Disc degeneration with loss of disc height and early vacuum phenomenon. No canal or foraminal compressive narrowing.  L2-3: Since 2018, the patient has developed considerable disc degeneration at this level with loss of disc height and vacuum phenomenon. This is worse on the left than the right. There is loss of height at the superior endplate on the left consistent with subchondral collapse. Chronic sclerotic changes affect the bone. There is annular bulging. Facets and ligaments are hypertrophic. There is moderate stenosis at this level that could cause neural compression, particularly on the left.  L3-4: Chronic disc degeneration with loss of disc height and vacuum phenomenon. Chronic sclerotic changes. Left pedicle screw comes very close to the upper vertebral body as seen previously. There is been posterior decompression at this level. There is continued stenosis of the lateral recesses and foramina, left worse than right.  L4 to sacrum: Previous posterior decompression, diskectomy and fusion. Fusion is solid. No evidence of hardware loosening or failure.  IMPRESSION: No change in the decompression and fusion segment from L4 to the sacrum.  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 Complete loss of disc height with vacuum phenomenon. Stenosis of the lateral recesses and foramina left more than right could cause neural compression, particularly on the left.  L2-3 shows marked progression of degenerative change since 2018 with a level was essentially negative. Disc is markedly degenerated now with disc narrowing and vacuum phenomenon, worse on the left. Subchondral collapse of the superior endplate on the left. Stenosis of the lateral recesses and foramina left more than right could cause neural compression, particularly on the left.  Electronically Signed    By: Nelson Chimes M.D.   On: 12/11/2019 23:46  Assessment  The primary encounter diagnosis was Chronic lower extremity pain (Primary Area of Pain) (Bilateral) (L>R). Diagnoses of Chronic low back pain (Secondary area of Pain) (Bilateral) (L>R), Chronic upper extremity pain (Third area of Pain) (Bilateral) (L>R), Chronic pain syndrome, and Fibromyalgia were also pertinent to this visit.  Plan of Care  Problem-specific:  No problem-specific Assessment & Plan notes found for this encounter.  Suzanne Avila has a current medication list which includes the following long-term medication(s): albuterol, atorvastatin, diltiazem, fluticasone-salmeterol, furosemide, [START ON 02/08/2020] hydrocodone-acetaminophen, [START ON 03/09/2020] hydrocodone-acetaminophen, [START ON 04/08/2020] hydrocodone-acetaminophen, omeprazole, [START ON 02/08/2020] pregabalin, ramipril, ropinirole, spironolactone, and meloxicam.  Pharmacotherapy (Medications Ordered): Meds ordered this encounter  Medications  . HYDROcodone-acetaminophen (NORCO) 7.5-325 MG tablet    Sig: Take 1 tablet by mouth every 6 (six) hours as needed for severe pain. Must last 30 days    Dispense:  120 tablet    Refill:  0    Chronic Pain: STOP  Act (Not applicable) Fill 1 day early if closed on refill date. Do not fill until: 02/08/2020. To last until: 03/09/2020. Avoid benzodiazepines within 8 hours of opioids  . HYDROcodone-acetaminophen (NORCO) 7.5-325 MG tablet    Sig: Take 1 tablet by mouth every 6 (six) hours as needed for severe pain. Must last 30 days    Dispense:  120 tablet    Refill:  0    Chronic Pain: STOP Act (Not applicable) Fill 1 day early if closed on refill date. Do not fill until: 03/09/2020. To last until: 04/08/2020. Avoid benzodiazepines within 8 hours of opioids  . HYDROcodone-acetaminophen (NORCO) 7.5-325 MG tablet    Sig: Take 1 tablet by mouth every 6 (six) hours as needed for severe pain. Must last 30 days    Dispense:  120  tablet    Refill:  0    Chronic Pain: STOP Act (Not applicable) Fill 1 day early if closed on refill date. Do not fill until: 04/08/2020. To last until: 05/08/2020. Avoid benzodiazepines within 8 hours of opioids  . pregabalin (LYRICA) 150 MG capsule    Sig: Take 1 capsule (150 mg total) by mouth every 8 (eight) hours.    Dispense:  270 capsule    Refill:  1    Fill one day early if pharmacy is closed on scheduled refill date. May substitute for generic if available.   Orders:  No orders of the defined types were placed in this encounter.  Follow-up plan:   Return in about 14 weeks (around 05/06/2020) for (F2F), (MM).      Considering:   Diagnostic bilateral IA hip joint injection  Possible bilateral hip joint RFA  Possible bilateral lumbar facet RFA  Possible left SI joint RFA  Diagnostic bilateral L4-L5 TFESI  Diagnostic caudal ESI  Diagnostic caudal epidurogram  Possible RACZ epidural lysis of adhesions  Diagnostic left L2-3 LESI  Possible bilateral Lumbar SCS trial implant    Palliative PRN treatment(s):   Diagnostic bilateral lumbar facet block #2  Diagnostic left SI joint block #2      Recent Visits Date Type Provider Dept  12/02/19 Telemedicine Milinda Pointer, MD [facility]  11/19/19 Procedure visit Milinda Pointer, MD [facility]  11/06/19 Telemedicine Milinda Pointer, MD [facility]  Showing recent visits within past 90 days and meeting all other requirements   Today's Visits Date Type Provider Dept  01/29/20 Telemedicine Milinda Pointer, MD [facility]  Showing today's visits and meeting all other requirements   Future Appointments No visits were found meeting these conditions.  Showing future appointments within next 90 days and meeting all other requirements   I discussed the assessment and treatment plan with the patient. The patient was provided an opportunity to ask questions and all were answered. The  patient agreed with the plan and demonstrated an understanding of the instructions.  Patient advised to call back or seek an in-person evaluation if the symptoms or condition worsens.  Duration of encounter: 13 minutes.  Note by: Gaspar Cola, MD Date: 01/29/2020; Time: 3:03 PM

## 2020-01-28 NOTE — Telephone Encounter (Signed)
Attempted to call patient, line busy. °

## 2020-01-29 ENCOUNTER — Other Ambulatory Visit: Payer: Self-pay

## 2020-01-29 ENCOUNTER — Ambulatory Visit: Payer: Medicare Other | Attending: Pain Medicine | Admitting: Pain Medicine

## 2020-01-29 DIAGNOSIS — M79602 Pain in left arm: Secondary | ICD-10-CM

## 2020-01-29 DIAGNOSIS — G894 Chronic pain syndrome: Secondary | ICD-10-CM

## 2020-01-29 DIAGNOSIS — M5441 Lumbago with sciatica, right side: Secondary | ICD-10-CM

## 2020-01-29 DIAGNOSIS — M79605 Pain in left leg: Secondary | ICD-10-CM

## 2020-01-29 DIAGNOSIS — M79604 Pain in right leg: Secondary | ICD-10-CM | POA: Diagnosis not present

## 2020-01-29 DIAGNOSIS — M79601 Pain in right arm: Secondary | ICD-10-CM

## 2020-01-29 DIAGNOSIS — G8929 Other chronic pain: Secondary | ICD-10-CM

## 2020-01-29 DIAGNOSIS — M5442 Lumbago with sciatica, left side: Secondary | ICD-10-CM

## 2020-01-29 DIAGNOSIS — M797 Fibromyalgia: Secondary | ICD-10-CM

## 2020-01-29 MED ORDER — HYDROCODONE-ACETAMINOPHEN 7.5-325 MG PO TABS: 1.0000 | ORAL_TABLET | Freq: Four times a day (QID) | ORAL | 0 refills | Status: DC | PRN

## 2020-01-29 MED ORDER — PREGABALIN 150 MG PO CAPS: 150.0000 mg | ORAL_CAPSULE | Freq: Three times a day (TID) | ORAL | 1 refills | Status: DC

## 2020-01-30 ENCOUNTER — Encounter: Payer: Self-pay | Admitting: Physician Assistant

## 2020-02-03 ENCOUNTER — Encounter: Payer: Self-pay | Admitting: Physician Assistant

## 2020-02-03 ENCOUNTER — Ambulatory Visit (INDEPENDENT_AMBULATORY_CARE_PROVIDER_SITE_OTHER): Payer: Medicare Other | Admitting: Physician Assistant

## 2020-02-03 ENCOUNTER — Other Ambulatory Visit: Payer: Self-pay

## 2020-02-03 VITALS — BP 143/90 | HR 81 | Temp 96.9°F | Resp 16 | Ht <= 58 in | Wt 96.7 lb

## 2020-02-03 DIAGNOSIS — I10 Essential (primary) hypertension: Secondary | ICD-10-CM

## 2020-02-03 DIAGNOSIS — M8589 Other specified disorders of bone density and structure, multiple sites: Secondary | ICD-10-CM

## 2020-02-03 DIAGNOSIS — E7800 Pure hypercholesterolemia, unspecified: Secondary | ICD-10-CM | POA: Diagnosis not present

## 2020-02-03 DIAGNOSIS — F17200 Nicotine dependence, unspecified, uncomplicated: Secondary | ICD-10-CM | POA: Diagnosis not present

## 2020-02-03 DIAGNOSIS — E559 Vitamin D deficiency, unspecified: Secondary | ICD-10-CM | POA: Diagnosis not present

## 2020-02-03 DIAGNOSIS — J449 Chronic obstructive pulmonary disease, unspecified: Secondary | ICD-10-CM

## 2020-02-03 DIAGNOSIS — Z1211 Encounter for screening for malignant neoplasm of colon: Secondary | ICD-10-CM

## 2020-02-03 MED ORDER — ALBUTEROL SULFATE HFA 108 (90 BASE) MCG/ACT IN AERS: INHALATION_SPRAY | RESPIRATORY_TRACT | 3 refills | Status: DC

## 2020-02-03 MED ORDER — FLUTICASONE-SALMETEROL 250-50 MCG/DOSE IN AEPB: INHALATION_SPRAY | RESPIRATORY_TRACT | 3 refills | Status: DC

## 2020-02-03 NOTE — Patient Instructions (Signed)
[facility] [address] [place],  [place]  [address] Get Driving Directions Main: [phone]   Health Maintenance for Postmenopausal Women Menopause is a normal process in which your ability to get pregnant comes to an end. This process happens slowly over many months or years, usually between the ages of 48 and 55. Menopause is complete when you have missed your menstrual periods for 12 months. It is important to talk with your health care provider about some of the most common conditions that affect women after menopause (postmenopausal women). These include heart disease, cancer, and bone loss (osteoporosis). Adopting a healthy lifestyle and getting preventive care can help to promote your health and wellness. The actions you take can also lower your chances of developing some of these common conditions. What should I know about menopause? During menopause, you may get a number of symptoms, such as:  Hot flashes. These can be moderate or severe.  Night sweats.  Decrease in sex drive.  Mood swings.  Headaches.  Tiredness.  Irritability.  Memory problems.  Insomnia. Choosing to treat or not to treat these symptoms is a decision that you make with your health care provider. Do I need hormone replacement therapy?  Hormone replacement therapy is effective in treating symptoms that are caused by menopause, such as hot flashes and night sweats.  Hormone replacement carries certain risks, especially as you become older. If you are thinking about using estrogen or estrogen with progestin, discuss the benefits and risks with your health care provider. What is my risk for heart disease and stroke? The risk of heart disease, heart attack, and stroke increases as you age. One of the causes may be a change in the body's hormones during menopause. This can affect how your body uses dietary fats, triglycerides, and cholesterol. Heart attack and stroke  are medical emergencies. There are many things that you can do to help prevent heart disease and stroke. Watch your blood pressure  High blood pressure causes heart disease and increases the risk of stroke. This is more likely to develop in people who have high blood pressure readings, are of African descent, or are overweight.  Have your blood pressure checked: ? Every 3-5 years if you are 18-39 years of age. ? Every year if you are 40 years old or older. Eat a healthy diet   Eat a diet that includes plenty of vegetables, fruits, low-fat dairy products, and lean protein.  Do not eat a lot of foods that are high in solid fats, added sugars, or sodium. Get regular exercise Get regular exercise. This is one of the most important things you can do for your health. Most adults should:  Try to exercise for at least 150 minutes each week. The exercise should increase your heart rate and make you sweat (moderate-intensity exercise).  Try to do strengthening exercises at least twice each week. Do these in addition to the moderate-intensity exercise.  Spend less time sitting. Even light physical activity can be beneficial. Other tips  Work with your health care provider to achieve or maintain a healthy weight.  Do not use any products that contain nicotine or tobacco, such as cigarettes, e-cigarettes, and chewing tobacco. If you need help quitting, ask your health care provider.  Know your numbers. Ask your health care provider to check your cholesterol and your blood sugar (glucose). Continue to have your blood tested as directed by your health care provider. Do I need screening for   cancer? Depending on your health history and family history, you may need to have cancer screening at different stages of your life. This may include screening for:  Breast cancer.  Cervical cancer.  Lung cancer.  Colorectal cancer. What is my risk for osteoporosis? After menopause, you may be at increased  risk for osteoporosis. Osteoporosis is a condition in which bone destruction happens more quickly than new bone creation. To help prevent osteoporosis or the bone fractures that can happen because of osteoporosis, you may take the following actions:  If you are 19-50 years old, get at least 1,000 mg of calcium and at least 600 mg of vitamin D per day.  If you are older than age 50 but younger than age 70, get at least 1,200 mg of calcium and at least 600 mg of vitamin D per day.  If you are older than age 70, get at least 1,200 mg of calcium and at least 800 mg of vitamin D per day. Smoking and drinking excessive alcohol increase the risk of osteoporosis. Eat foods that are rich in calcium and vitamin D, and do weight-bearing exercises several times each week as directed by your health care provider. How does menopause affect my mental health? Depression may occur at any age, but it is more common as you become older. Common symptoms of depression include:  Low or sad mood.  Changes in sleep patterns.  Changes in appetite or eating patterns.  Feeling an overall lack of motivation or enjoyment of activities that you previously enjoyed.  Frequent crying spells. Talk with your health care provider if you think that you are experiencing depression. General instructions See your health care provider for regular wellness exams and vaccines. This may include:  Scheduling regular health, dental, and eye exams.  Getting and maintaining your vaccines. These include: ? Influenza vaccine. Get this vaccine each year before the flu season begins. ? Pneumonia vaccine. ? Shingles vaccine. ? Tetanus, diphtheria, and pertussis (Tdap) booster vaccine. Your health care provider may also recommend other immunizations. Tell your health care provider if you have ever been abused or do not feel safe at home. Summary  Menopause is a normal process in which your ability to get pregnant comes to an  end.  This condition causes hot flashes, night sweats, decreased interest in sex, mood swings, headaches, or lack of sleep.  Treatment for this condition may include hormone replacement therapy.  Take actions to keep yourself healthy, including exercising regularly, eating a healthy diet, watching your weight, and checking your blood pressure and blood sugar levels.  Get screened for cancer and depression. Make sure that you are up to date with all your vaccines. This information is not intended to replace advice given to you by your health care provider. Make sure you discuss any questions you have with your health care provider. Document Revised: 10/03/2018 Document Reviewed: 10/03/2018 Elsevier Patient Education  2020 [employer] Inc[employer]  

## 2020-02-03 NOTE — Progress Notes (Signed)
e   Established patient visit      Patient: Suzanne Avila   DOB: January 12, 1946   74 y.o. Female  MRN: [id number] Visit Date: 02/03/2020  Today's healthcare provider: Margaretann Loveless, PA-C  Subjective:    Chief Complaint  Patient presents with  . Follow-up    Chronic issues   HPI Patient here to follow up on chronic problems. Essential hypertension Stable.She is currently on Diltiazem and spironolactone. No dizziness or headache or visual disturbances. BP Readings from Last 3 Encounters:  02/03/20 (!) 143/90  11/19/19 (!) 188/97  10/21/19 128/86   Wt Readings from Last 3 Encounters:  02/03/20 96 lb 11.2 oz (43.9 kg)  11/19/19 97 lb (44 kg)  10/21/19 97 lb 4.8 oz (44.1 kg)    Pure hypercholesterolemia Stable. She is on Atorvastatin 40mg   RLS (restless legs syndrome) Change ROPINIRole from 3mg  nightly to 2mg  BID.   Patient reports that she is not feeling totally well. Reports fatigue, vomiting once "more gagging from the cough." Reports that she got her second COVID vaccine the 8th of April. Reports she started feeling bad on Friday, the day after the 2nd vaccine. Felt bad all weekend. Today she feels much better.   Patient Active Problem List   Diagnosis Date Noted  . DDD (degenerative disc disease), lumbosacral 11/19/2019  . Therapeutic opioid-induced constipation (OIC) 08/01/2019  . Chronic sacroiliac joint pain (Left) 05/01/2019  . Chronic groin pain (Left) 05/01/2019  . Spondylosis without myelopathy or radiculopathy, lumbosacral region 05/01/2019  . Other specified dorsopathies, sacral and sacrococcygeal region 05/01/2019  . Renal artery stenosis (HCC) 12/27/2017  . Pain in limb 12/22/2017  . Tobacco use disorder 12/22/2017  . Renal atrophy, right 12/22/2017  . Thoracic radiculitis 01/31/2017  . Chronic lower extremity pain (Primary Area of Pain) (Bilateral) (L>R) 04/25/2016  . Chronic upper extremity pain (Third area of Pain) (Bilateral) (L>R) 04/25/2016    . Disturbance of skin sensation 04/20/2016  . Neurogenic pain 04/20/2016  . Chronic hip pain (Bilateral) (L>R) 11/11/2015  . Vitamin D deficiency 11/11/2015  . Edema 09/02/2015  . 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 Encounter for therapeutic drug level monitoring 08/14/2015  . Long term current use of opiate analgesic 08/14/2015  . Long term prescription opiate use 08/14/2015  . Uncomplicated opioid dependence (HCC) 08/14/2015  . Opiate use (30 MME/Day) 08/14/2015  . Chronic low back pain (Secondary area of Pain) (Bilateral) (L>R) 08/14/2015  . Chronic pain syndrome 08/14/2015  . Alcoholic (HCC) 08/14/2015  . B12 deficiency 08/14/2015  . Narrowing of intervertebral disc space 08/14/2015  . Encounter for screening for diabetes mellitus 08/14/2015  . History of metabolic disorder 08/14/2015  . Fungal infection of toenail 08/14/2015  . Osteopenia 08/14/2015  . Compulsive tobacco user syndrome 08/14/2015  . Osteoarthritis of hip (Left) 08/14/2015  . Chronic hip pain (Left) 08/14/2015  . Chronic lumbar radicular pain (L5 dermatome) (Bilateral) (L>R) 08/14/2015  . Lumbar facet syndrome (Bilateral) (L>R) 08/14/2015  . Lumbar spondylosis 08/14/2015  . 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 Lumbar Postlaminectomy syndrome (L4-5) 08/14/2015  . Fibromyalgia 08/14/2015  . Chronic airway obstruction (HCC) 04/21/2015  . Allergic rhinitis 02/10/2010  . Atrial paroxysmal tachycardia (HCC) 07/06/2009  . CAFL (chronic airflow limitation) (HCC) 07/06/2009  . Acid reflux 07/06/2009  . Glaucoma 07/06/2009  . Benign essential HTN 07/06/2009  . Hypercholesterolemia without hypertriglyceridemia 07/06/2009  . Absence of bladder continence 07/06/2009   Past Medical History:  Diagnosis  Date  . Glaucoma   . Hyperlipidemia   . Hypertension    Allergies  Allergen Reactions  . Codeine Itching  . Cyclobenzaprine Itching  . Penicillins     itching        Medications: Outpatient Medications Prior to Visit  Medication Sig  . albuterol (PROVENTIL HFA;VENTOLIN HFA) 108 (90 Base) MCG/ACT inhaler ALBUTEROL, 90MCG/ACT (Inhalation Aerosol Solution)  2 PUFFS Every Day prn  . aspirin 325 MG tablet Take 1 tablet (325 mg total) by mouth daily.  Marland Kitchen atorvastatin (LIPITOR) 40 MG tablet Take 1 tablet (40 mg total) by mouth daily.  Marland Kitchen diltiazem (CARDIZEM) 120 MG tablet Take 1 tablet (120 mg total) by mouth daily.  . dorzolamide (TRUSOPT) 2 % ophthalmic solution Place 1 drop into both eyes 3 (three) times daily. (Patient taking differently: Place 1 drop into both eyes 2 (two) times daily. )  . Fluticasone-Salmeterol (ADVAIR DISKUS) 250-50 MCG/DOSE AEPB USE 1 INHALATION TWICE A DAY  . furosemide (LASIX) 20 MG tablet Take 1 tablet (20 mg total) by mouth daily.  Marland Kitchen latanoprost (XALATAN) 0.005 % ophthalmic solution Place 1 drop into both eyes at bedtime.  . magnesium gluconate (MAGONATE) 500 MG tablet Take 500 mg by mouth daily.  . Multiple Vitamin (MULTI-VITAMIN) tablet Take 1 tablet by mouth daily.   . Multiple Vitamins-Minerals (VITAMIN D3 COMPLETE PO) Take by mouth.  Marland Kitchen omeprazole (PRILOSEC) 40 MG capsule Take 1 capsule (40 mg total) by mouth daily.  . ramipril (ALTACE) 10 MG capsule Take 1 capsule (10 mg total) by mouth daily.  Marland Kitchen rOPINIRole (REQUIP) 2 MG tablet Take 1 tablet (2 mg total) by mouth 2 (two) times daily.  Marland Kitchen spironolactone (ALDACTONE) 25 MG tablet Take 1 tablet (25 mg total) by mouth daily.  . vitamin B-12 (CYANOCOBALAMIN) 500 MCG tablet Take 1 tablet (500 mcg total) by mouth daily.  Melene Muller ON 02/08/2020] HYDROcodone-acetaminophen (NORCO) 7.5-325 MG tablet Take 1 tablet by mouth every 6 (six) hours as needed for severe pain. Must last 30 days (Patient not taking: Reported on 02/03/2020)  . [START ON 03/09/2020] HYDROcodone-acetaminophen (NORCO) 7.5-325 MG tablet Take 1 tablet by mouth every 6 (six) hours as needed for severe pain. Must last 30 days  (Patient not taking: Reported on 02/03/2020)  . [START ON 04/08/2020] HYDROcodone-acetaminophen (NORCO) 7.5-325 MG tablet Take 1 tablet by mouth every 6 (six) hours as needed for severe pain. Must last 30 days (Patient not taking: Reported on 02/03/2020)  . meloxicam (MOBIC) 15 MG tablet Take 1 tablet (15 mg total) by mouth daily.  Melene Muller ON 02/08/2020] pregabalin (LYRICA) 150 MG capsule Take 1 capsule (150 mg total) by mouth every 8 (eight) hours. (Patient not taking: Reported on 02/03/2020)   No facility-administered medications prior to visit.    Review of Systems  Constitutional: Positive for appetite change and fatigue. Negative for chills and fever.  Respiratory: Positive for cough. Negative for chest tightness and shortness of breath.   Cardiovascular: Negative for chest pain and palpitations.  Gastrointestinal: Positive for nausea. Negative for abdominal pain and vomiting.  Neurological: Negative for dizziness and weakness.    Last CBC Lab Results  Component Value Date   WBC 7.1 01/09/2019   HGB 14.8 01/09/2019   HCT 44.0 01/09/2019   MCV 97 01/09/2019   MCH 32.7 01/09/2019   RDW 12.1 01/09/2019   PLT 264 01/09/2019   Last metabolic panel Lab Results  Component Value Date   GLUCOSE 83 01/09/2019   NA 138  01/09/2019   K 4.5 01/09/2019   CL 97 01/09/2019   CO2 23 01/09/2019   BUN 7 (L) 01/09/2019   CREATININE 0.77 01/09/2019   GFRNONAA 77 01/09/2019   GFRAA 89 01/09/2019   CALCIUM 10.1 01/09/2019   PROT 6.6 01/09/2019   ALBUMIN 4.1 01/09/2019   LABGLOB 2.5 01/09/2019   AGRATIO 1.6 01/09/2019   BILITOT 0.5 01/09/2019   ALKPHOS 94 01/09/2019   AST 21 01/09/2019   ALT 12 01/09/2019   ANIONGAP 11 11/11/2015   Last lipids Lab Results  Component Value Date   CHOL 197 01/09/2019   HDL 77 01/09/2019   LDLCALC 104 (H) 01/09/2019   TRIG 78 01/09/2019   CHOLHDL 2.6 01/09/2019   Last hemoglobin A1c No results found for: HGBA1C      Objective:    BP (!) 143/90  (BP Location: Left Arm, Patient Position: Sitting, Cuff Size: Normal)   Pulse 81   Temp (!) 96.9 F (36.1 C) (Temporal)   Resp 16   Ht 4' 8.5" (1.435 m)   Wt 96 lb 11.2 oz (43.9 kg)   BMI 21.30 kg/m  BP Readings from Last 3 Encounters:  02/03/20 (!) 143/90  11/19/19 (!) 188/97  10/21/19 128/86   Wt Readings from Last 3 Encounters:  02/03/20 96 lb 11.2 oz (43.9 kg)  11/19/19 97 lb (44 kg)  10/21/19 97 lb 4.8 oz (44.1 kg)      Physical Exam Vitals reviewed.  Constitutional:      General: She is not in acute distress.    Appearance: Normal appearance. She is well-developed and normal weight. She is not ill-appearing or diaphoretic.  HENT:     Head: Normocephalic and atraumatic.     Right Ear: Tympanic membrane, ear canal and external ear normal.     Left Ear: Tympanic membrane, ear canal and external ear normal.  Eyes:     General: No scleral icterus.       Right eye: No discharge.        Left eye: No discharge.     Extraocular Movements: Extraocular movements intact.     Conjunctiva/sclera: Conjunctivae normal.     Pupils: Pupils are equal, round, and reactive to light.  Neck:     Thyroid: No thyromegaly.     Vascular: No carotid bruit or JVD.     Trachea: No tracheal deviation.  Cardiovascular:     Rate and Rhythm: Normal rate and regular rhythm.     Pulses: Normal pulses.     Heart sounds: Normal heart sounds. No murmur. No friction rub. No gallop.   Pulmonary:     Effort: Pulmonary effort is normal. No respiratory distress.     Breath sounds: Normal breath sounds. No wheezing or rales.  Chest:     Chest wall: No tenderness.  Abdominal:     General: Abdomen is flat. Bowel sounds are normal. There is no distension.     Palpations: Abdomen is soft. There is no mass.     Tenderness: There is no abdominal tenderness. There is no guarding or rebound.  Musculoskeletal:        General: No tenderness. Normal range of motion.     Cervical back: Normal range of motion  and neck supple.     Right lower leg: No edema.     Left lower leg: No edema.  Lymphadenopathy:     Cervical: No cervical adenopathy.  Skin:    General: Skin is warm and dry.  Capillary Refill: Capillary refill takes less than 2 seconds.     Findings: No rash.  Neurological:     General: No focal deficit present.     Mental Status: She is alert and oriented to person, place, and time. Mental status is at baseline.  Psychiatric:        Mood and Affect: Mood normal.        Behavior: Behavior normal.        Thought Content: Thought content normal.        Judgment: Judgment normal.       No results found for any visits on 02/03/20.    Assessment & Plan:    1. Benign essential HTN Slightly elevated today. Patient declines medication changes as she feels it is due to her not feeling well. Continue Diltiazem 120mg  daily, furosemide 20mg  daily and ramipril 10mg  daily.  - CBC w/Diff/Platelet - Comprehensive Metabolic Panel (CMET) - Lipid Panel With LDL/HDL Ratio  2. Hypercholesterolemia without hypertriglyceridemia Stable. Continue Atorvastatin 40mg . Will check labs as below and f/u pending results. - CBC w/Diff/Platelet - Comprehensive Metabolic Panel (CMET) - Lipid Panel With LDL/HDL Ratio  3. Tobacco use disorder Does not desire to quit.  - CBC w/Diff/Platelet - Comprehensive Metabolic Panel (CMET)  4. Vitamin D deficiency H/O this. Does have osteopenia and is post menopausal. Will check labs as below and f/u pending results. Bone density ordered as well.  - CBC w/Diff/Platelet - Comprehensive Metabolic Panel (CMET) - Vitamin D (25 hydroxy) - DG Bone Density; Future  5. Osteopenia of multiple sites See above medical treatment plan. - CBC w/Diff/Platelet - Comprehensive Metabolic Panel (CMET) - Vitamin D (25 hydroxy) - DG Bone Density; Future  6. Chronic obstructive pulmonary disease, unspecified COPD type ([place]) Stable. Diagnosis pulled for medication refill.  Continue current medical treatment plan. - Fluticasone-Salmeterol (ADVAIR DISKUS) 250-50 MCG/DOSE AEPB; USE 1 INHALATION TWICE A DAY  Dispense: 180 each; Refill: 3 - albuterol (VENTOLIN HFA) 108 (90 Base) MCG/ACT inhaler; ALBUTEROL, 90MCG/ACT (Inhalation Aerosol Solution)  2 PUFFS Every Day prn  Dispense: 18 g; Refill: 3  7. Colon cancer screening Patient declined.      Rubye Beach  [facility] [phone] (phone) [phone] (fax)  [place]

## 2020-02-04 ENCOUNTER — Telehealth: Payer: Self-pay

## 2020-02-04 LAB — LIPID PANEL WITH LDL/HDL RATIO
Cholesterol, Total: 154 mg/dL (ref 100–199)
HDL: 70 mg/dL (ref >39)
LDL Chol Calc (NIH): 64 mg/dL (ref 0–99)
LDL/HDL Ratio: 0.9 ratio (ref 0.0–3.2)
Triglycerides: 117 mg/dL (ref 0–149)
VLDL Cholesterol Cal: 20 mg/dL (ref 5–40)

## 2020-02-04 LAB — CBC WITH DIFFERENTIAL/PLATELET
Basophils Absolute: 0.1 10*3/uL (ref 0.0–0.2)
Basos: 1 %
EOS (ABSOLUTE): 0.3 10*3/uL (ref 0.0–0.4)
Eos: 4 %
Hematocrit: 43.6 % (ref 34.0–46.6)
Hemoglobin: 15.1 g/dL (ref 11.1–15.9)
Immature Grans (Abs): 0 10*3/uL (ref 0.0–0.1)
Immature Granulocytes: 0 %
Lymphocytes Absolute: 1.5 10*3/uL (ref 0.7–3.1)
Lymphs: 20 %
MCH: 33.4 pg — ABNORMAL HIGH (ref 26.6–33.0)
MCHC: 34.6 g/dL (ref 31.5–35.7)
MCV: 97 fL (ref 79–97)
Monocytes Absolute: 0.9 10*3/uL (ref 0.1–0.9)
Monocytes: 12 %
Neutrophils Absolute: 4.5 10*3/uL (ref 1.4–7.0)
Neutrophils: 63 %
Platelets: 301 10*3/uL (ref 150–450)
RBC: 4.52 x10E6/uL (ref 3.77–5.28)
RDW: 11.7 % (ref 11.7–15.4)
WBC: 7.3 10*3/uL (ref 3.4–10.8)

## 2020-02-04 LAB — COMPREHENSIVE METABOLIC PANEL
ALT: 10 IU/L (ref 0–32)
AST: 15 IU/L (ref 0–40)
Albumin/Globulin Ratio: 1.8 (ref 1.2–2.2)
Albumin: 4.2 g/dL (ref 3.7–4.7)
Alkaline Phosphatase: 96 IU/L (ref 39–117)
BUN/Creatinine Ratio: 12 (ref 12–28)
BUN: 12 mg/dL (ref 8–27)
Bilirubin Total: 0.3 mg/dL (ref 0.0–1.2)
CO2: 21 mmol/L (ref 20–29)
Calcium: 10.1 mg/dL (ref 8.7–10.3)
Chloride: 100 mmol/L (ref 96–106)
Creatinine, Ser: 1.04 mg/dL — ABNORMAL HIGH (ref 0.57–1.00)
GFR calc Af Amer: 61 mL/min/{1.73_m2} (ref >59)
GFR calc non Af Amer: 53 mL/min/{1.73_m2} — ABNORMAL LOW (ref >59)
Globulin, Total: 2.3 g/dL (ref 1.5–4.5)
Glucose: 96 mg/dL (ref 65–99)
Potassium: 4.4 mmol/L (ref 3.5–5.2)
Sodium: 137 mmol/L (ref 134–144)
Total Protein: 6.5 g/dL (ref 6.0–8.5)

## 2020-02-04 LAB — VITAMIN D 25 HYDROXY (VIT D DEFICIENCY, FRACTURES): Vit D, 25-Hydroxy: 45.9 ng/mL (ref 30.0–100.0)

## 2020-02-04 NOTE — Telephone Encounter (Signed)
Pt advised.   Thanks,   -Lovell Roe  

## 2020-02-04 NOTE — Telephone Encounter (Signed)
-----   Message from Margaretann Loveless, PA-C sent at 02/04/2020  3:37 PM EDT ----- Blood count is normal. Kidney function is slightly declined from baseline. Make sure to push fluids and stay well hydrated. Smoking cessation can also help. Liver enzymes are normal. Sodium, potassium and calcium are normal. Cholesterol is normal. Vit D is normal.

## 2020-02-05 ENCOUNTER — Telehealth: Payer: TRICARE For Life (TFL) | Admitting: Pain Medicine

## 2020-03-17 LAB — HM DEXA SCAN

## 2020-03-17 LAB — HM MAMMOGRAPHY

## 2020-03-19 ENCOUNTER — Telehealth: Payer: Self-pay

## 2020-03-19 ENCOUNTER — Encounter: Payer: Self-pay | Admitting: Physician Assistant

## 2020-03-19 NOTE — Telephone Encounter (Signed)
Patient advised that her mammogram reports per provider was normal-Repeat annually. Her bone density shows worsening Osteoporosis n the right femur and right forearm. She will discuss in the upcoming appointment. Patient verbalized understanding.

## 2020-04-02 ENCOUNTER — Other Ambulatory Visit: Payer: Self-pay | Admitting: Physician Assistant

## 2020-04-02 DIAGNOSIS — I10 Essential (primary) hypertension: Secondary | ICD-10-CM

## 2020-04-08 ENCOUNTER — Other Ambulatory Visit: Payer: Self-pay | Admitting: Physician Assistant

## 2020-04-08 DIAGNOSIS — G2581 Restless legs syndrome: Secondary | ICD-10-CM

## 2020-04-08 MED ORDER — ROPINIROLE HCL 2 MG PO TABS: 2.0000 mg | ORAL_TABLET | Freq: Two times a day (BID) | ORAL | 0 refills | Status: DC

## 2020-04-08 NOTE — Telephone Encounter (Signed)
Medication Refill - Medication:  rOPINIRole (REQUIP) 2 MG tablet [[id number]]  Has the patient contacted their pharmacy? No. (Agent: If no, request that the patient contact the pharmacy for the refill.) (Agent: If yes, when and what did the pharmacy advise?)  Preferred Pharmacy (with phone number or street [facility]   Agent: Please be advised that RX refills may take up to 3 business days. We ask that you follow-up with your pharmacy.

## 2020-04-08 NOTE — Telephone Encounter (Signed)
Appointment 05/04/20

## 2020-04-17 ENCOUNTER — Other Ambulatory Visit: Payer: Self-pay | Admitting: Physician Assistant

## 2020-04-17 DIAGNOSIS — I10 Essential (primary) hypertension: Secondary | ICD-10-CM

## 2020-05-04 ENCOUNTER — Encounter: Payer: Self-pay | Admitting: Physician Assistant

## 2020-05-04 ENCOUNTER — Ambulatory Visit
Admission: RE | Admit: 2020-05-04 | Discharge: 2020-05-04 | Disposition: A | Discharge status: Home / Self Care | Payer: Medicare Other | Attending: Physician Assistant | Admitting: Physician Assistant

## 2020-05-04 ENCOUNTER — Ambulatory Visit
Admission: RE | Admit: 2020-05-04 | Discharge: 2020-05-04 | Disposition: A | Discharge status: Home / Self Care | Payer: Medicare Other | Source: Ambulatory Visit | Attending: Physician Assistant | Admitting: Physician Assistant

## 2020-05-04 ENCOUNTER — Ambulatory Visit (INDEPENDENT_AMBULATORY_CARE_PROVIDER_SITE_OTHER): Payer: Medicare Other | Admitting: Physician Assistant

## 2020-05-04 ENCOUNTER — Telehealth: Payer: Self-pay | Admitting: Physician Assistant

## 2020-05-04 ENCOUNTER — Ambulatory Visit: Payer: Medicare Other | Admitting: Pain Medicine

## 2020-05-04 ENCOUNTER — Other Ambulatory Visit: Payer: Self-pay

## 2020-05-04 VITALS — BP 118/62 | HR 69 | Temp 97.1°F | Wt 101.0 lb

## 2020-05-04 DIAGNOSIS — E7800 Pure hypercholesterolemia, unspecified: Secondary | ICD-10-CM | POA: Diagnosis not present

## 2020-05-04 DIAGNOSIS — I10 Essential (primary) hypertension: Secondary | ICD-10-CM

## 2020-05-04 DIAGNOSIS — K529 Noninfective gastroenteritis and colitis, unspecified: Secondary | ICD-10-CM | POA: Insufficient documentation

## 2020-05-04 DIAGNOSIS — M545 Low back pain, unspecified: Secondary | ICD-10-CM

## 2020-05-04 DIAGNOSIS — M797 Fibromyalgia: Secondary | ICD-10-CM | POA: Diagnosis not present

## 2020-05-04 DIAGNOSIS — I701 Atherosclerosis of renal artery: Secondary | ICD-10-CM

## 2020-05-04 DIAGNOSIS — J449 Chronic obstructive pulmonary disease, unspecified: Secondary | ICD-10-CM

## 2020-05-04 MED ORDER — SPIRONOLACTONE 25 MG PO TABS: 25.0000 mg | ORAL_TABLET | Freq: Every day | ORAL | 1 refills | Status: DC

## 2020-05-04 MED ORDER — ATORVASTATIN CALCIUM 40 MG PO TABS: 40.0000 mg | ORAL_TABLET | Freq: Every day | ORAL | 1 refills | Status: DC

## 2020-05-04 MED ORDER — DILTIAZEM HCL 120 MG PO TABS: 120.0000 mg | ORAL_TABLET | Freq: Every day | ORAL | 1 refills | Status: DC

## 2020-05-04 MED ORDER — ALBUTEROL SULFATE (2.5 MG/3ML) 0.083% IN NEBU: 2.5000 mg | INHALATION_SOLUTION | Freq: Four times a day (QID) | RESPIRATORY_TRACT | 1 refills | Status: DC | PRN

## 2020-05-04 NOTE — Assessment & Plan Note (Addendum)
Chronic and uncontrolled.  Also with Nausea Pt states she has had diarrhea for over a year now with fecal incontinence for six months. Pt advised to take omeprazole daily. Discussed referral to GI to evaluate and treat.  Pt declined at this time and will try omeprazole daily.  Advised her to call if symptoms worsening or don't improve.   Will follow up in three months.

## 2020-05-04 NOTE — Patient Instructions (Signed)

## 2020-05-04 NOTE — Progress Notes (Signed)
I,Laura E Walsh,acting as a Neurosurgeon for [employer], PA-C.,have documented all relevant documentation on the behalf of Margaretann Loveless, PA-C,as directed by  Margaretann Loveless, PA-C while in the presence of Margaretann Loveless, [place].   Established patient visit   Patient: Suzanne Avila   DOB: 11-Apr-1946   74 y.o. Female  MRN: [id number] Visit Date: 05/04/2020  Today's healthcare provider: Margaretann Loveless, PA-C   Chief Complaint  Patient presents with  . Hyperlipidemia  . Hypertension   Subjective    HPI  Hypertension, follow-up  BP Readings from Last 3 Encounters:  05/04/20 118/62  02/03/20 (!) 143/90  11/19/19 (!) 188/97   Wt Readings from Last 3 Encounters:  05/04/20 101 lb (45.8 kg)  02/03/20 96 lb 11.2 oz (43.9 kg)  11/19/19 97 lb (44 kg)     She was last seen for hypertension 3 months ago.  BP at that visit was 143/90. Management since that visit includes Continue Diltiazem 120mg  daily, furosemide 20mg  daily and ramipril 10mg  daily.   She reports excellent compliance with treatment. She is not having side effects.  She is following a Regular diet. She is not exercising. She does smoke.   Outside blood pressures are not being checked. Symptoms: No chest pain No chest pressure  No palpitations No syncope  No dyspnea No orthopnea  No paroxysmal nocturnal dyspnea No lower extremity edema   Pertinent labs: Lab Results  Component Value Date   CHOL 154 02/03/2020   HDL 70 02/03/2020   LDLCALC 64 02/03/2020   TRIG 117 02/03/2020   CHOLHDL 2.6 01/09/2019   Lab Results  Component Value Date   NA 137 02/03/2020   K 4.4 02/03/2020   CREATININE 1.04 (H) 02/03/2020   GFRNONAA 53 (L) 02/03/2020   GFRAA 61 02/03/2020   GLUCOSE 96 02/03/2020     The 10-year ASCVD risk score 04/04/2020 DC Jr., et al., 2013) is: 25.1%   --------------------------------------------------------------------------------------------------- Lipid/Cholesterol,  Follow-up  Last lipid panel Other pertinent labs  Lab Results  Component Value Date   CHOL 154 02/03/2020   HDL 70 02/03/2020   LDLCALC 64 02/03/2020   TRIG 117 02/03/2020   CHOLHDL 2.6 01/09/2019   Lab Results  Component Value Date   ALT 10 02/03/2020   AST 15 02/03/2020   PLT 301 02/03/2020   TSH 1.110 06/14/2016     She was last seen for this 3 months ago.  Management since that visit includes Stable. Continue Atorvastatin 40mg .  She reports excellent compliance with treatment. She is not having side effects.   Symptoms: No chest pain No chest pressure/discomfort  No dyspnea No lower extremity edema  No numbness or tingling of extremity No orthopnea  No palpitations No paroxysmal nocturnal dyspnea  No speech difficulty No syncope   The 10-year ASCVD risk score 04/04/2020 DC Jr., et al., 2013) is: 25.1%  --------------------------------------------------------------------------------------------------- Follow up for Vitamin D Deficiency  The patient was last seen for this 3 months ago. Changes made at last visit include Bone density ordered. Patient with osteopenia.  -----------------------------------------------------------------------------------------  Patient Active Problem List   Diagnosis Date Noted  . Chronic diarrhea 05/04/2020  . DDD (degenerative disc disease), lumbosacral 11/19/2019  . Therapeutic opioid-induced constipation (OIC) 08/01/2019  . Chronic sacroiliac joint pain (Left) 05/01/2019  . Chronic groin pain (Left) 05/01/2019  . Spondylosis without myelopathy or radiculopathy, lumbosacral region 05/01/2019  . Other specified dorsopathies, sacral and sacrococcygeal region 05/01/2019  . Renal artery  stenosis (HCC) 12/27/2017  . Pain in limb 12/22/2017  . Tobacco use disorder 12/22/2017  . Renal atrophy, right 12/22/2017  . Thoracic radiculitis 01/31/2017  . Chronic lower extremity pain (Primary Area of Pain) (Bilateral) (L>R) 04/25/2016  . Chronic  upper extremity pain (Third area of Pain) (Bilateral) (L>R) 04/25/2016  . Disturbance of skin sensation 04/20/2016  . Neurogenic pain 04/20/2016  . Chronic hip pain (Bilateral) (L>R) 11/11/2015  . Vitamin D deficiency 11/11/2015  . Edema 09/02/2015  . 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 Encounter for therapeutic drug level monitoring 08/14/2015  . Long term current use of opiate analgesic 08/14/2015  . Long term prescription opiate use 08/14/2015  . Uncomplicated opioid dependence (HCC) 08/14/2015  . Opiate use (30 MME/Day) 08/14/2015  . Chronic low back pain (Secondary area of Pain) (Bilateral) (L>R) 08/14/2015  . Chronic pain syndrome 08/14/2015  . Alcoholic (HCC) 08/14/2015  . B12 deficiency 08/14/2015  . Narrowing of intervertebral disc space 08/14/2015  . Encounter for screening for diabetes mellitus 08/14/2015  . History of metabolic disorder 08/14/2015  . Fungal infection of toenail 08/14/2015  . Osteopenia 08/14/2015  . Compulsive tobacco user syndrome 08/14/2015  . Osteoarthritis of hip (Left) 08/14/2015  . Chronic hip pain (Left) 08/14/2015  . Chronic lumbar radicular pain (L5 dermatome) (Bilateral) (L>R) 08/14/2015  . Lumbar facet syndrome (Bilateral) (L>R) 08/14/2015  . Lumbar spondylosis 08/14/2015  . 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 Lumbar Postlaminectomy syndrome (L4-5) 08/14/2015  . Fibromyalgia 08/14/2015  . Chronic airway obstruction (HCC) 04/21/2015  . Allergic rhinitis 02/10/2010  . Atrial paroxysmal tachycardia (HCC) 07/06/2009  . CAFL (chronic airflow limitation) (HCC) 07/06/2009  . Acid reflux 07/06/2009  . Glaucoma 07/06/2009  . Benign essential HTN 07/06/2009  . Hypercholesterolemia without hypertriglyceridemia 07/06/2009  . Absence of bladder continence 07/06/2009   Past Medical History:  Diagnosis Date  . Glaucoma   . Hyperlipidemia   . Hypertension    Social History   Tobacco Use  . Smoking  status: Current Every Day Smoker    Packs/day: 1.00    Years: 50.00    Pack years: 50.00    Types: Cigarettes  . Smokeless tobacco: Never Used  Vaping Use  . Vaping Use: Former  Substance Use Topics  . Alcohol use: Yes    Alcohol/week: 35.0 standard drinks    Types: 35 Cans of beer per week    Comment: 4-5 beers every day  . Drug use: No   Allergies  Allergen Reactions  . Codeine Itching  . Cyclobenzaprine Itching  . Penicillins     itching     Medications: Outpatient Medications Prior to Visit  Medication Sig  . albuterol (VENTOLIN HFA) 108 (90 Base) MCG/ACT inhaler ALBUTEROL, 90MCG/ACT (Inhalation Aerosol Solution)  2 PUFFS Every Day prn  . aspirin 325 MG tablet Take 1 tablet (325 mg total) by mouth daily.  . dorzolamide (TRUSOPT) 2 % ophthalmic solution Place 1 drop into both eyes 3 (three) times daily. (Patient taking differently: Place 1 drop into both eyes 2 (two) times daily. )  . Fluticasone-Salmeterol (ADVAIR DISKUS) 250-50 MCG/DOSE AEPB USE 1 INHALATION TWICE A DAY  . furosemide (LASIX) 20 MG tablet Take 1 tablet (20 mg total) by mouth daily.  Marland Kitchen. HYDROcodone-acetaminophen (NORCO) 7.5-325 MG tablet Take 1 tablet by mouth every 6 (six) hours as needed for severe pain. Must last 30 days  . latanoprost (XALATAN) 0.005 % ophthalmic solution Place 1 drop into both  eyes at bedtime.  . magnesium gluconate (MAGONATE) 500 MG tablet Take 500 mg by mouth daily.  . Multiple Vitamin (MULTI-VITAMIN) tablet Take 1 tablet by mouth daily.   . Multiple Vitamins-Minerals (VITAMIN D3 COMPLETE PO) Take by mouth.  Marland Kitchen omeprazole (PRILOSEC) 40 MG capsule Take 1 capsule (40 mg total) by mouth daily.  . pregabalin (LYRICA) 150 MG capsule Take 1 capsule (150 mg total) by mouth every 8 (eight) hours.  . ramipril (ALTACE) 10 MG capsule TAKE 1 CAPSULE DAILY  . rOPINIRole (REQUIP) 2 MG tablet Take 1 tablet (2 mg total) by mouth 2 (two) times daily.  . vitamin B-12 (CYANOCOBALAMIN) 500 MCG tablet Take  1 tablet (500 mcg total) by mouth daily.  . [DISCONTINUED] atorvastatin (LIPITOR) 40 MG tablet Take 1 tablet (40 mg total) by mouth daily.  . [DISCONTINUED] diltiazem (CARDIZEM) 120 MG tablet Take 1 tablet (120 mg total) by mouth daily.  . [DISCONTINUED] spironolactone (ALDACTONE) 25 MG tablet Take 1 tablet (25 mg total) by mouth daily.  Marland Kitchen HYDROcodone-acetaminophen (NORCO) 7.5-325 MG tablet Take 1 tablet by mouth every 6 (six) hours as needed for severe pain. Must last 30 days (Patient not taking: Reported on 02/03/2020)  . HYDROcodone-acetaminophen (NORCO) 7.5-325 MG tablet Take 1 tablet by mouth every 6 (six) hours as needed for severe pain. Must last 30 days (Patient not taking: Reported on 02/03/2020)  . meloxicam (MOBIC) 15 MG tablet Take 1 tablet (15 mg total) by mouth daily.   No facility-administered medications prior to visit.    Review of Systems  Constitutional: Negative.   Respiratory: Negative.   Cardiovascular: Negative.   Gastrointestinal: Positive for diarrhea (Chronic issue) and nausea. Negative for abdominal distention, abdominal pain, anal bleeding, blood in stool, constipation, rectal pain and vomiting.  Neurological: Positive for weakness and light-headedness. Negative for dizziness and headaches.      Objective    BP 118/62 (BP Location: Left Arm, Patient Position: Sitting, Cuff Size: Normal)   Pulse 69   Temp (!) 97.1 F (36.2 C) (Temporal)   Wt 101 lb (45.8 kg)   SpO2 97%   BMI 22.24 kg/m    Physical Exam Constitutional:      Appearance: Normal appearance.  Cardiovascular:     Rate and Rhythm: Normal rate and regular rhythm.     Pulses: Normal pulses.     Heart sounds: Normal heart sounds.  Pulmonary:     Breath sounds: Examination of the right-upper field reveals rhonchi. Examination of the left-upper field reveals rhonchi. Examination of the right-middle field reveals rhonchi. Examination of the left-middle field reveals rhonchi. Examination of the  right-lower field reveals rhonchi. Examination of the left-lower field reveals rhonchi. Rhonchi present.  Abdominal:     General: Abdomen is flat. Bowel sounds are normal. There is no distension.     Palpations: Abdomen is soft. There is no mass.     Tenderness: There is abdominal tenderness in the left upper quadrant. There is no guarding or rebound.  Neurological:     Mental Status: She is alert.       No results found for any visits on 05/04/20.  Assessment & Plan      Problem List Items Addressed This Visit      Cardiovascular and Mediastinum   Renal artery stenosis (HCC)   Relevant Medications   atorvastatin (LIPITOR) 40 MG tablet   diltiazem (CARDIZEM) 120 MG tablet   spironolactone (ALDACTONE) 25 MG tablet   Benign essential HTN - Primary  Well controlled Continue current medications Review metabolic panel F/u in 3 months       Relevant Medications   atorvastatin (LIPITOR) 40 MG tablet   diltiazem (CARDIZEM) 120 MG tablet   spironolactone (ALDACTONE) 25 MG tablet     Respiratory   CAFL (chronic airflow limitation) (HCC)   Relevant Medications   albuterol (PROVENTIL) (2.5 MG/3ML) 0.083% nebulizer solution     Digestive   Chronic diarrhea    Chronic and uncontrolled.  Also with Nausea Pt states she has had diarrhea for over a year now with fecal incontinence for six months. Pt advised to take omeprazole daily. Discussed referral to GI to evaluate and treat.  Pt declined at this time and will try omeprazole daily.  Advised her to call if symptoms worsening or don't improve.   Will follow up in three months.         Other   Fibromyalgia (Chronic)   Hypercholesterolemia without hypertriglyceridemia    Previously well controlled Continue statin Reviewed FLP and CMP Goal LDL < 100       Relevant Medications   atorvastatin (LIPITOR) 40 MG tablet   diltiazem (CARDIZEM) 120 MG tablet   spironolactone (ALDACTONE) 25 MG tablet    Other Visit Diagnoses     Acute low back pain without sciatica, unspecified back pain laterality       Relevant Orders   DG Sacrum/Coccyx   Pure hypercholesterolemia       Relevant Medications   atorvastatin (LIPITOR) 40 MG tablet   diltiazem (CARDIZEM) 120 MG tablet   spironolactone (ALDACTONE) 25 MG tablet   Essential hypertension       Relevant Medications   atorvastatin (LIPITOR) 40 MG tablet   diltiazem (CARDIZEM) 120 MG tablet   spironolactone (ALDACTONE) 25 MG tablet       Return in about 3 months (around 08/04/2020).      Delmer Islam, PA-C, have reviewed all documentation for this visit. The documentation on 05/04/20 for the exam, diagnosis, procedures, and orders are all accurate and complete.   Reine Just  [facility] [phone] (phone) [phone] (fax)  [facility]

## 2020-05-04 NOTE — Telephone Encounter (Signed)
spironolactone (ALDACTONE) 25 MG tablet  albuterol (PROVENTIL) (2.5 MG/3ML) 0.083% nebulizer solution  atorvastatin (LIPITOR) 40 MG tablet  diltiazem (CARDIZEM) 120 MG tablet     Patient states these medications are too expensive from local pharmacy. She has contacted pharmacy to let them know to cancel the local delivery. Patient inquired if this [facility] so she is able to afford.     [facility] - [place], [place] - [address] Phone:  [phone]  Fax:  [phone]

## 2020-05-04 NOTE — Assessment & Plan Note (Signed)
Well controlled Continue current medications Review metabolic panel F/u in 3 months

## 2020-05-04 NOTE — Assessment & Plan Note (Signed)
Previously well controlled Continue statin Reviewed FLP and CMP Goal LDL < 100  

## 2020-05-05 ENCOUNTER — Ambulatory Visit: Payer: Medicare Other | Admitting: Pain Medicine

## 2020-05-05 DIAGNOSIS — Z79899 Other long term (current) drug therapy: Secondary | ICD-10-CM | POA: Insufficient documentation

## 2020-05-05 NOTE — Progress Notes (Signed)
PROVIDER NOTE: Information contained herein reflects review and annotations entered in association with encounter. Interpretation of such information and data should be left to medically-trained personnel. Information provided to patient can be located elsewhere in the medical record under "Patient Instructions". Document created using STT-dictation technology, any transcriptional errors that may result from process are unintentional.    Patient: Suzanne Avila  Service Category: E/M  Provider: Gaspar Cola, MD  DOB: December 06, 1945  DOS: 05/06/2020  Specialty: Interventional Pain Management  MRN: [id number]  Setting: Ambulatory outpatient  PCP: Mar Daring, PA-C  Type: Established Patient    Referring Provider: Florian Buff*  Location: Office  Delivery: Face-to-face     HPI  Reason for encounter: Suzanne Avila, a 74 y.o. year old female, is here today for evaluation and management of her Chronic pain syndrome [G89.4]. Suzanne Avila primary complain today is Back Pain Last encounter: Practice (01/28/2020). My last encounter with her was on 11/19/2019. 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 Pain Assessment: Severity of Chronic pain is reported as a 7 /10. Location: Back Lower/pain radiaties down lefg at times. Onset: More than a month ago. Quality: Aching, Burning, Throbbing, Stabbing, Nagging, Discomfort. Timing: Constant. Modifying factor(s): sit down and meds. Vitals:  height is _0  (1.499 m) and weight is 100 lb (45.4 kg). Her temperature is 98.4 F (36.9 C). Her blood pressure is 143/82 (abnormal) and her pulse is 83. Her oxygen saturation is 97%.    The patient indicates doing well with the current medication regimen. No adverse reactions or side effects reported to the medications.  The patient indicates that she is began to experience a little bit more pain in the left lower back and she would like to have the shots that we did last time repeated for her.  In reviewing that, I see that on 11/19/2019 she had a left-sided lumbar facet and SI joint injection done which apparently lasted until recently when the pain started coming back.  Unfortunately, even if it works really good, she is not a good candidate for radiofrequency ablation due to her anatomy and hardware.  Pharmacotherapy Assessment   Analgesic: Hydrocodone/APAP 7.5/325, 1 tab PO q 6 hrs (30 mg/day of hydrocodone) MME/day:30 mg/day.   Monitoring: [place] PMP: PDMP reviewed during this encounter.       Pharmacotherapy: No side-effects or adverse reactions reported. Compliance: No problems identified. Effectiveness: Clinically acceptable.  Chauncey Fischer, RN  05/06/2020 10:52 AM  Sign when Signing Visit Nursing Pain Medication Assessment:  Safety precautions to be maintained throughout the outpatient stay will include: orient to surroundings, keep bed in low position, maintain call bell within reach at all times, provide assistance with transfer out of bed and  ambulation.  Medication Inspection  Compliance: Pill count conducted under aseptic conditions, in front of the patient. Neither the pills nor the bottle was removed from the patient's sight at any time. Once count was completed pills were immediately returned to the patient in their original bottle.  Medication: Hydrocodone/APAP Pill/Patch Count: 7 of 120 pills remain Pill/Patch Appearance: Markings consistent with prescribed medication Bottle Appearance: Standard pharmacy container. Clearly labeled. Filled Date:05 / 17 / 2021 Last Medication intake:  TodaySafety precautions to be maintained throughout the outpatient stay will include: orient to surroundings, keep bed in low position, maintain call bell within reach at all times, provide assistance with transfer out of bed and ambulation.     UDS:  Summary  Date Value Ref Range Status  08/02/2018 FINAL  Final    Comment:    ==================================================================== TOXASSURE SELECT 13 (MW) ==================================================================== Test                             Result       Flag       Units Drug Present and Declared for Prescription Verification   Hydrocodone                    3379         EXPECTED   ng/mg creat   Hydromorphone                  1755         EXPECTED   ng/mg creat   Dihydrocodeine                 230          EXPECTED   ng/mg creat   Norhydrocodone                 1521         EXPECTED   ng/mg creat    Sources of hydrocodone include scheduled prescription    medications. Hydromorphone, dihydrocodeine and norhydrocodone are    expected metabolites of hydrocodone. Hydromorphone and    dihydrocodeine are also available as scheduled prescription    medications. ==================================================================== Test                      Result    Flag   Units      Ref Range   Creatinine              33               mg/dL       >=20 ==================================================================== Declared Medications:  The flagging and interpretation on this report are based on the  following declared medications.  Unexpected results may arise from  inaccuracies in the declared medications.  **Note: The testing scope of this panel includes these medications:  Hydrocodone (Norco)  **Note: The testing scope of this panel does not include following  reported medications:  Acetaminophen (Norco)  Albuterol  Aspirin  Atorvastatin (Lipitor)  Cyanocobalamin  Diltiazem (Cardizem)  Dorzolamide  Fluticasone (Advair)  Latanoprost (Xalatan)  Meloxicam (Mobic)  Omeprazole (Prilosec)  Potassium (Klor-Con)  Pregabalin (Lyrica)  Ramipril (Altace)  Ropinirole (Requip)  Salmeterol (Advair)  Spironolactone (Aldactone)  Vitamin D ==================================================================== For clinical consultation, please call [phone]. ====================================================================      ROS  Constitutional: Denies any fever or chills Gastrointestinal: No reported hemesis, hematochezia, vomiting, or acute GI distress Musculoskeletal: Denies any acute onset joint swelling, redness, loss  of ROM, or weakness Neurological: No reported episodes of acute onset apraxia, aphasia, dysarthria, agnosia, amnesia, paralysis, loss of coordination, or loss of consciousness  Medication Review  Fluticasone-Salmeterol, HYDROcodone-acetaminophen, Multi-Vitamin, Multiple Vitamins-Minerals, aspirin, atorvastatin, diltiazem, dorzolamide, furosemide, latanoprost, magnesium gluconate, meloxicam, omeprazole, pregabalin, rOPINIRole, ramipril, spironolactone, and vitamin B-12  History Review  Allergy: Suzanne Avila is allergic to codeine, cyclobenzaprine, and penicillins. Drug: Suzanne Avila  reports no history of drug use. Alcohol:  reports current alcohol use of about 35.0 standard drinks of alcohol  per week. Tobacco:  reports that she has been smoking cigarettes. She has a 50.00 pack-year smoking history. She has never used smokeless tobacco. Social: Suzanne Avila  reports that she has been smoking cigarettes. She has a 50.00 pack-year smoking history. She has never used smokeless tobacco. She reports current alcohol use of about 35.0 standard drinks of alcohol per week. She reports that she does not use drugs. Medical:  has a past medical history of Glaucoma, Hyperlipidemia, and Hypertension. Surgical: Suzanne Avila  has a past surgical history that includes Back surgery; Eye surgery; and Carpal tunnel release (Left). Family: family history includes Cancer in her father and mother.  Laboratory Chemistry Profile   Renal Lab Results  Component Value Date   BUN 12 02/03/2020   CREATININE 1.04 (H) 02/03/2020   BCR 12 02/03/2020   GFRAA 61 02/03/2020   GFRNONAA 53 (L) 02/03/2020     Hepatic Lab Results  Component Value Date   AST 15 02/03/2020   ALT 10 02/03/2020   ALBUMIN 4.2 02/03/2020   ALKPHOS 96 02/03/2020     Electrolytes Lab Results  Component Value Date   NA 137 02/03/2020   K 4.4 02/03/2020   CL 100 02/03/2020   CALCIUM 10.1 02/03/2020   MG 1.7 02/14/2017     Bone Lab Results  Component Value Date   VD25OH 45.9 02/03/2020     Inflammation (CRP: Acute Phase) (ESR: Chronic Phase) Lab Results  Component Value Date   CRP <0.8 02/14/2017   ESRSEDRATE 20 02/14/2017       Note: Above Lab results reviewed.  Recent Imaging Review  DG Sacrum/Coccyx CLINICAL DATA:  Back pain.  Recent fall.  Posterior sacral pain.  EXAM: SACRUM AND COCCYX - 2+ VIEW  COMPARISON:  Lumbar spine CT 12/11/2019  FINDINGS: Fusion hardware is again noted at L4-5 and L5-S1. Adjacent level disease is present at L3-4. Sacrum is intact. No acute or healing fractures are present. Coccyx is unremarkable. Visualized pelvis is within normal limits. Atherosclerotic changes are  noted.  IMPRESSION: 1. No acute abnormality. 2. Stable fusion hardware at L4-5 and L5-S1. 3. 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  Electronically Signed   By: San Morelle M.D.   On: 05/04/2020 16:39 Note: Reviewed        Physical Exam  General appearance: Well nourished, well developed, and well hydrated. In no apparent acute distress Mental status: Alert, oriented x 3 (person, place, & time)       Respiratory: No evidence of acute respiratory distress Eyes: PERLA Vitals: BP (!) 143/82   Pulse 83   Temp 98.4 F (36.9 C)   Ht _0  (1.499 m)   Wt 100 lb (45.4 kg)   SpO2 97%   BMI 20.20 kg/m  BMI: Estimated body mass index is 20.2 kg/m as calculated from the following:   Height as of this encounter: _1  (1.499 m).   Weight as of this encounter: 100 lb (45.4 kg). Ideal: Female patients  must weigh at least 45.5 kg to calculate ideal body weight  Assessment   Status Diagnosis  Controlled Controlled Controlled 1. Chronic pain syndrome   2. Chronic low back pain (Secondary area of Pain) (Bilateral) (L>R)   3. Lumbar facet syndrome (Bilateral) (L>R)   4. Chronic sacroiliac joint pain (Left)   5. Pharmacologic therapy      Updated Problems: No problems updated.  Plan of Care  Problem-specific:  No problem-specific Assessment & Plan notes found for this encounter.  Suzanne Avila has a current medication list which includes the following long-term medication(s): atorvastatin, diltiazem, fluticasone-salmeterol, furosemide, [START ON 05/08/2020] hydrocodone-acetaminophen, [START ON 06/07/2020] hydrocodone-acetaminophen, [START ON 07/07/2020] hydrocodone-acetaminophen, omeprazole, pregabalin, ramipril, ropinirole, spironolactone, and meloxicam.  Pharmacotherapy (Medications Ordered): Meds ordered this encounter  Medications  . HYDROcodone-acetaminophen (NORCO) 7.5-325 MG tablet    Sig: Take 1 tablet by mouth every 6 (six) hours as needed for severe pain. Must  last 30 days    Dispense:  120 tablet    Refill:  0    Chronic Pain: STOP Act (Not applicable) Fill 1 day early if closed on refill date. Do not fill until: 05/08/2020. To last until: 06/07/2020. Avoid benzodiazepines within 8 hours of opioids  . HYDROcodone-acetaminophen (NORCO) 7.5-325 MG tablet    Sig: Take 1 tablet by mouth every 6 (six) hours as needed for severe pain. Must last 30 days    Dispense:  120 tablet    Refill:  0    Chronic Pain: STOP Act (Not applicable) Fill 1 day early if closed on refill date. Do not fill until: 06/07/2020. To last until: 07/07/2020. Avoid benzodiazepines within 8 hours of opioids  . HYDROcodone-acetaminophen (NORCO) 7.5-325 MG tablet    Sig: Take 1 tablet by mouth every 6 (six) hours as needed for severe pain. Must last 30 days    Dispense:  120 tablet    Refill:  0    Chronic Pain: STOP Act (Not applicable) Fill 1 day early if closed on refill date. Do not fill until: 07/07/2020. To last until: 08/06/2020. Avoid benzodiazepines within 8 hours of opioids   Orders:  Orders Placed This Encounter  Procedures  . LUMBAR FACET(MEDIAL BRANCH NERVE BLOCK) MBNB    Standing Status:   Future    Standing Expiration Date:   06/06/2020    Scheduling Instructions:     Procedure: Lumbar facet block (AKA.: Lumbosacral medial branch nerve block)     Side: Left-sided     Level: L3-4, L4-5, & L5-S1 Facets (L2, L3, L4, L5, & S1 Medial Branch Nerves)     Sedation: With Sedation.     Timeframe: ASAP    Order Specific Question:   Where will this procedure be performed?    Answer:   [facility]  . SACROILIAC JOINT INJECTION    Standing Status:   Future    Standing Expiration Date:   06/06/2020    Scheduling Instructions:     Side: Left-sided     Sedation: With Sedation.     Timeframe: ASAP    Order Specific Question:   Where will this procedure be performed?    Answer:   [facility]  . ToxASSURE Select 13 (MW), Urine    Volume: 30 ml(s). Minimum 3 ml of  urine is needed. Document temperature of fresh sample. Indications: Long term (current) use of opiate analgesic (K93.818)    Order Specific Question:   Release to patient    Answer:  Immediate   Follow-up plan:   Return for Procedure (w/ sedation): (L) L-FCT BLK #3 + (L) SI BLK #3.      Considering:   Diagnostic bilateral IA hip joint injection  Possible bilateral hip joint RFA  Possible bilateral lumbar facet RFA  Possible left SI joint RFA  Diagnostic bilateral L4-L5 TFESI  Diagnostic caudal ESI  Diagnostic caudal epidurogram  Possible RACZ epidural lysis of adhesions  Diagnostic left L2-3 LESI  Possible bilateral Lumbar SCS trial implant    Palliative PRN treatment(s):   Diagnostic right lumbar facet block #2  Diagnostic left lumbar facet block #3  Diagnostic left SI joint block #3     Recent Visits No visits were found meeting these conditions. Showing recent visits within past 90 days and meeting all other requirements Today's Visits Date Type Provider Dept  05/06/20 Office Visit Milinda Pointer, MD [facility]  Showing today's visits and meeting all other requirements Future Appointments Date Type Provider Dept  05/26/20 Appointment Milinda Pointer, MD [facility]  Showing future appointments within next 90 days and meeting all other requirements  I discussed the assessment and treatment plan with the patient. The patient was provided an opportunity to ask questions and all were answered. The patient agreed with the plan and demonstrated an understanding of the instructions.  Patient advised to call back or seek an in-person evaluation if the symptoms or condition worsens.  Duration of encounter: 30 minutes.  Note by: Gaspar Cola, MD Date: 05/06/2020; Time: 11:28 AM

## 2020-05-06 ENCOUNTER — Encounter: Payer: Self-pay | Admitting: Pain Medicine

## 2020-05-06 ENCOUNTER — Ambulatory Visit: Payer: Medicare Other | Attending: Pain Medicine | Admitting: Pain Medicine

## 2020-05-06 ENCOUNTER — Other Ambulatory Visit: Payer: Self-pay

## 2020-05-06 VITALS — BP 143/82 | HR 83 | Temp 98.4°F | Ht 59.0 in | Wt 100.0 lb

## 2020-05-06 DIAGNOSIS — G894 Chronic pain syndrome: Secondary | ICD-10-CM | POA: Diagnosis present

## 2020-05-06 DIAGNOSIS — G8929 Other chronic pain: Secondary | ICD-10-CM

## 2020-05-06 DIAGNOSIS — M533 Sacrococcygeal disorders, not elsewhere classified: Secondary | ICD-10-CM | POA: Diagnosis present

## 2020-05-06 DIAGNOSIS — Z79899 Other long term (current) drug therapy: Secondary | ICD-10-CM | POA: Insufficient documentation

## 2020-05-06 DIAGNOSIS — M5442 Lumbago with sciatica, left side: Secondary | ICD-10-CM

## 2020-05-06 DIAGNOSIS — M5441 Lumbago with sciatica, right side: Secondary | ICD-10-CM | POA: Insufficient documentation

## 2020-05-06 DIAGNOSIS — M47816 Spondylosis without myelopathy or radiculopathy, lumbar region: Secondary | ICD-10-CM | POA: Diagnosis present

## 2020-05-06 MED ORDER — HYDROCODONE-ACETAMINOPHEN 7.5-325 MG PO TABS: 1.0000 | ORAL_TABLET | Freq: Four times a day (QID) | ORAL | 0 refills | Status: DC | PRN

## 2020-05-06 MED ORDER — DILTIAZEM HCL 120 MG PO TABS: 120.0000 mg | ORAL_TABLET | Freq: Every day | ORAL | 1 refills | Status: DC

## 2020-05-06 MED ORDER — ATORVASTATIN CALCIUM 40 MG PO TABS: 40.0000 mg | ORAL_TABLET | Freq: Every day | ORAL | 1 refills | Status: DC

## 2020-05-06 MED ORDER — ALBUTEROL SULFATE (2.5 MG/3ML) 0.083% IN NEBU: 2.5000 mg | INHALATION_SOLUTION | Freq: Four times a day (QID) | RESPIRATORY_TRACT | 1 refills | Status: DC | PRN

## 2020-05-06 MED ORDER — SPIRONOLACTONE 25 MG PO TABS: 25.0000 mg | ORAL_TABLET | Freq: Every day | ORAL | 1 refills | Status: DC

## 2020-05-06 NOTE — Patient Instructions (Addendum)
____________________________________________________________________________________________  Preparing for Procedure with Sedation  Procedure appointments are limited to planned procedures: . No Prescription Refills. . No disability issues will be discussed. . No medication changes will be discussed.  Instructions: . Oral Intake: Do not eat or drink anything for at least 8 hours prior to your procedure. (Exception: Blood Pressure Medication. See below.) . Transportation: Unless otherwise stated by your physician, you may drive yourself after the procedure. . Blood Pressure Medicine: Do not forget to take your blood pressure medicine with a sip of water the morning of the procedure. If your Diastolic (lower reading)is above 100 mmHg, elective cases will be cancelled/rescheduled. . Blood thinners: These will need to be stopped for procedures. Notify our staff if you are taking any blood thinners. Depending on which one you take, there will be specific instructions on how and when to stop it. . Diabetics on insulin: Notify the staff so that you can be scheduled 1st case in the morning. If your diabetes requires high dose insulin, take only  of your normal insulin dose the morning of the procedure and notify the staff that you have done so. . Preventing infections: Shower with an antibacterial soap the morning of your procedure. . Build-up your immune system: Take 1000 mg of Vitamin C with every meal (3 times a day) the day prior to your procedure. . Antibiotics: Inform the staff if you have a condition or reason that requires you to take antibiotics before dental procedures. . Pregnancy: If you are pregnant, call and cancel the procedure. . Sickness: If you have a cold, fever, or any active infections, call and cancel the procedure. . Arrival: You must be in the facility at least 30 minutes prior to your scheduled procedure. . Children: Do not bring children with you. . Dress appropriately:  Bring dark clothing that you would not mind if they get stained. . Valuables: Do not bring any jewelry or valuables.  Reasons to call and reschedule or cancel your procedure: (Following these recommendations will minimize the risk of a serious complication.) . Surgeries: Avoid having procedures within 2 weeks of any surgery. (Avoid for 2 weeks before or after any surgery). . Flu Shots: Avoid having procedures within 2 weeks of a flu shots or . (Avoid for 2 weeks before or after immunizations). . Barium: Avoid having a procedure within 7-10 days after having had a radiological study involving the use of radiological contrast. (Myelograms, Barium swallow or enema study). . Heart attacks: Avoid any elective procedures or surgeries for the initial 6 months after a "Myocardial Infarction" (Heart Attack). . Blood thinners: It is imperative that you stop these medications before procedures. Let us know if you if you take any blood thinner.  . Infection: Avoid procedures during or within two weeks of an infection (including chest colds or gastrointestinal problems). Symptoms associated with infections include: Localized redness, fever, chills, night sweats or profuse sweating, burning sensation when voiding, cough, congestion, stuffiness, runny nose, sore throat, diarrhea, nausea, vomiting, cold or Flu symptoms, recent or current infections. It is specially important if the infection is over the area that we intend to treat. . Heart and lung problems: Symptoms that may suggest an active cardiopulmonary problem include: cough, chest pain, breathing difficulties or shortness of breath, dizziness, ankle swelling, uncontrolled high or unusually low blood pressure, and/or palpitations. If you are experiencing any of these symptoms, cancel your procedure and contact your primary care physician for an evaluation.  Remember:  Regular Business hours are:    Monday to Thursday 8:00 AM to 4:00 PM  Provider's  Schedule: Delano Metz, MD:  Procedure days: Tuesday and Thursday 7:30 AM to 4:00 PM  Edward Jolly, MD:  Procedure days: Monday and Wednesday 7:30 AM to 4:00 PM ____________________________________________________________________________________________    ____________________________________________________________________________________________  Drug Holidays (Slow)  What is a "Drug Holiday"? Drug Holiday: is the name given to the period of time during which a patient stops taking a medication(s) for the purpose of eliminating tolerance to the drug.  Benefits . Improved effectiveness of opioids. . Decreased opioid dose needed to achieve benefits. . Improved pain with lesser dose.  What is tolerance? Tolerance: is the progressive decreased in effectiveness of a drug due to its repetitive use. With repetitive use, the body gets use to the medication and as a consequence, it loses its effectiveness. This is a common problem seen with opioid pain medications. As a result, a larger dose of the drug is needed to achieve the same effect that used to be obtained with a smaller dose.  How long should a "Drug Holiday" last? You should stay off of the pain medicine for at least 14 consecutive days. (2 weeks)  Should I stop the medicine "cold [place]"? No. You should always coordinate with your Pain Specialist so that he/she can provide you with the correct medication dose to make the transition as smoothly as possible.  How do I stop the medicine? Slowly. You will be instructed to decrease the daily amount of pills that you take by one (1) pill every seven (7) days. This is called a "slow downward taper" of your dose. For example: if you normally take four (4) pills per day, you will be asked to drop this dose to three (3) pills per day for seven (7) days, then to two (2) pills per day for seven (7) days, then to one (1) per day for seven (7) days, and at the end of those last seven (7)  days, this is when the "Drug Holiday" would start.   Will I have withdrawals? By doing a "slow downward taper" like this one, it is unlikely that you will experience any significant withdrawal symptoms. Typically, what triggers withdrawals is the sudden stop of a high dose opioid therapy. Withdrawals can usually be avoided by slowly decreasing the dose over a prolonged period of time.  What are withdrawals? Withdrawals: refers to the wide range of symptoms that occur after stopping or dramatically reducing opiate drugs after heavy and prolonged use. Withdrawal symptoms do not occur to patients that use low dose opioids, or those who take the medication sporadically. Contrary to benzodiazepine (example: Valium, Xanax, etc.) or alcohol withdrawals ("Delirium Tremens"), opioid withdrawals are not lethal. Withdrawals are the physical manifestation of the body getting rid of the excess receptors.  Expected Symptoms Early symptoms of withdrawal may include: . Agitation . Anxiety . Muscle aches . Increased tearing . Insomnia . Runny nose . Sweating . Yawning  Late symptoms of withdrawal may include: . Abdominal cramping . Diarrhea . Dilated pupils . Goose bumps . Nausea . Vomiting  Will I experience withdrawals? Due to the slow nature of the taper, it is very unlikely that you will experience any.  What is a slow taper? Taper: refers to the gradual decrease in dose.  ___________________________________________________________________________________________    ____________________________________________________________________________________________  Medication Rules  Purpose: To inform patients, and their family members, of our rules and regulations.  Applies to: All patients receiving prescriptions (written or electronic).  Pharmacy of  record: Pharmacy where electronic prescriptions will be sent. If written prescriptions are taken to a different pharmacy, please inform the  nursing staff. The pharmacy listed in the electronic medical record should be the one where you would like electronic prescriptions to be sent.  Electronic prescriptions: In compliance with the [facility] Strengthen Opioid Misuse Prevention (STOP) Act of 2017 (Session Conni Elliot [phone]), effective October 24, 2018, all controlled substances must be electronically prescribed. Calling prescriptions to the pharmacy will cease to exist.  Prescription refills: Only during scheduled appointments. Applies to all prescriptions.  NOTE: The following applies primarily to controlled substances (Opioid* Pain Medications).   Type of encounter (visit): For patients receiving controlled substances, face-to-face visits are required. (Not an option or up to the patient.)  Patient's responsibilities: 1. Pain Pills: Bring all pain pills to every appointment (except for procedure appointments). 2. Pill Bottles: Bring pills in original pharmacy bottle. Always bring the newest bottle. Bring bottle, even if empty. 3. Medication refills: You are responsible for knowing and keeping track of what medications you take and those you need refilled. The day before your appointment: write a list of all prescriptions that need to be refilled. The day of the appointment: give the list to the admitting nurse. Prescriptions will be written only during appointments. No prescriptions will be written on procedure days. If you forget a medication: it will not be "Called in", "Faxed", or "electronically sent". You will need to get another appointment to get these prescribed. No early refills. Do not call asking to have your prescription filled early. 4. Prescription Accuracy: You are responsible for carefully inspecting your prescriptions before leaving our office. Have the discharge nurse carefully go over each prescription with you, before taking them home. Make sure that your name is accurately spelled, that your address is correct.  Check the name and dose of your medication to make sure it is accurate. Check the number of pills, and the written instructions to make sure they are clear and accurate. Make sure that you are given enough medication to last until your next medication refill appointment. 5. Taking Medication: Take medication as prescribed. When it comes to controlled substances, taking less pills or less frequently than prescribed is permitted and encouraged. Never take more pills than instructed. Never take medication more frequently than prescribed.  6. Inform other Doctors: Always inform, all of your healthcare providers, of all the medications you take. 7. Pain Medication from other Providers: You are not allowed to accept any additional pain medication from any other Doctor or Healthcare provider. There are two exceptions to this rule. (see below) In the event that you require additional pain medication, you are responsible for notifying us, as stated below. 8. Medication Agreement: You are responsible for carefully reading and following our Medication Agreement. This must be signed before receiving any prescriptions from our practice. Safely store a copy of your signed Agreement. Violations to the Agreement will result in no further prescriptions. (Additional copies of our Medication Agreement are available upon request.) 9. Laws, Rules, & Regulations: All patients are expected to follow all [address] and [employer], Rules, [place]. Ignorance of the Laws does not constitute a valid excuse.  10. Illegal drugs and Controlled Substances: The use of illegal substances (including, but not limited to marijuana and its derivatives) and/or the illegal use of any controlled substances is strictly prohibited. Violation of this rule may result in the immediate and permanent discontinuation of any and all prescriptions being written  by our practice. The use of any illegal substances is prohibited. 11. Adopted CDC  guidelines & recommendations: Target dosing levels will be at or below 60 MME/day. Use of benzodiazepines** is not recommended.  Exceptions: There are only two exceptions to the rule of not receiving pain medications from other Healthcare Providers. 1. Exception #1 (Emergencies): In the event of an emergency (i.e.: accident requiring emergency care), you are allowed to receive additional pain medication. However, you are responsible for: As soon as you are able, call our office [phone], at any time of the day or night, and leave a message stating your name, the date and nature of the emergency, and the name and dose of the medication prescribed. In the event that your call is answered by a member of our staff, make sure to document and save the date, time, and the name of the person that took your information.  2. Exception #2 (Planned Surgery): In the event that you are scheduled by another doctor or dentist to have any type of surgery or procedure, you are allowed (for a period no longer than 30 days), to receive additional pain medication, for the acute post-op pain. However, in this case, you are responsible for picking up a copy of our "Post-op Pain Management for Surgeons" handout, and giving it to your surgeon or dentist. This document is available at our office, and does not require an appointment to obtain it. Simply go to our office during business hours (Monday-Thursday from 8:00 AM to 4:00 PM) (Friday 8:00 AM to 12:00 Noon) or if you have a scheduled appointment with [place], prior to your surgery, and ask for it by name. In addition, you will need to provide [place] with your name, name of your surgeon, type of surgery, and date of procedure or surgery.  *Opioid medications include: morphine, codeine, oxycodone, oxymorphone, hydrocodone, hydromorphone, meperidine, tramadol, tapentadol, buprenorphine, fentanyl, methadone. **Benzodiazepine medications include: diazepam (Valium), alprazolam (Xanax),  clonazepam (Klonopine), lorazepam (Ativan), clorazepate (Tranxene), chlordiazepoxide (Librium), estazolam (Prosom), oxazepam (Serax), temazepam (Restoril), triazolam (Halcion) (Last updated: 12/21/2017) ____________________________________________________________________________________________   ____________________________________________________________________________________________  Medication Recommendations and Reminders  Applies to: All patients receiving prescriptions (written and/or electronic).  Medication Rules & Regulations: These rules and regulations exist for your safety and that of others. They are not flexible and neither are we. Dismissing or ignoring them will be considered "non-compliance" with medication therapy, resulting in complete and irreversible termination of such therapy. (See document titled "Medication Rules" for more details.) In all conscience, because of safety reasons, we cannot continue providing a therapy where the patient does not follow instructions.  Pharmacy of record:   Definition: This is the pharmacy where your electronic prescriptions will be sent.   We do not endorse any particular pharmacy.  You are not restricted in your choice of pharmacy.  The pharmacy listed in the electronic medical record should be the one where you want electronic prescriptions to be sent.  If you choose to change pharmacy, simply notify our nursing staff of your choice of new pharmacy.  Recommendations:  Keep all of your pain medications in a safe place, under lock and key, even if you live alone.   After you fill your prescription, take 1 week's worth of pills and put them away in a safe place. You should keep a separate, properly labeled bottle for this purpose. The remainder should be kept in the original bottle. Use this as your primary supply, until it runs out. Once it's gone, then you  know that you have 1 week's worth of medicine, and it is time to come in  for a prescription refill. If you do this correctly, it is unlikely that you will ever run out of medicine.  To make sure that the above recommendation works, it is very important that you make sure your medication refill appointments are scheduled at least 1 week before you run out of medicine. To do this in an effective manner, make sure that you do not leave the office without scheduling your next medication management appointment. Always ask the nursing staff to show you in your prescription , when your medication will be running out. Then arrange for the receptionist to get you a return appointment, at least 7 days before you run out of medicine. Do not wait until you have 1 or 2 pills left, to come in. This is very poor planning and does not take into consideration that we may need to cancel appointments due to bad weather, sickness, or emergencies affecting our staff.  "Partial Fill": If for any reason your pharmacy does not have enough pills/tablets to completely fill or refill your prescription, do not allow for a "partial fill". You will need a separate prescription to fill the remaining amount, which we will not provide. If the reason for the partial fill is your insurance, you will need to talk to the pharmacist about payment alternatives for the remaining tablets, but again, do not accept a partial fill.  Prescription refills and/or changes in medication(s):   Prescription refills, and/or changes in dose or medication, will be conducted only during scheduled medication management appointments. (Applies to both, written and electronic prescriptions.)  No refills on procedure days. No medication will be changed or started on procedure days. No changes, adjustments, and/or refills will be conducted on a procedure day. Doing so will interfere with the diagnostic portion of the procedure.  No phone refills. No medications will be "called into the pharmacy".  No Fax refills.  No weekend  refills.  No Holliday refills.  No after hours refills.  Remember:  Business hours are:  Monday to Thursday 8:00 AM to 4:00 PM Provider's Schedule: Delano MetzFrancisco Tashana Haberl, MD - Appointments are:  Medication management: Monday and Wednesday 8:00 AM to 4:00 PM Procedure day: Tuesday and Thursday 7:30 AM to 4:00 PM Edward JollyBilal Lateef, MD - Appointments are:  Medication management: Tuesday and Thursday 8:00 AM to 4:00 PM Procedure day: Monday and Wednesday 7:30 AM to 4:00 PM (Last update: 12/21/2017) ____________________________________________________________________________________________   ____________________________________________________________________________________________  CANNABIDIOL (AKA: CBD Oil or Pills)  Applies to: All patients receiving prescriptions of controlled substances (written and/or electronic).  General Information: Cannabidiol (CBD) was discovered in [id number]. It is one of some 113 identified cannabinoids in cannabis (Marijuana) plants, accounting for up to 40% of the plant's extract. As of 2018, preliminary clinical research on cannabidiol included studies of anxiety, cognition, movement disorders, and pain.  Cannabidiol is consummed in multiple ways, including inhalation of cannabis smoke or vapor, as an aerosol spray into the cheek, and by mouth. It may be supplied as CBD oil containing CBD as the active ingredient (no added tetrahydrocannabinol (THC) or terpenes), a full-plant CBD-dominant hemp extract oil, capsules, dried cannabis, or as a liquid solution. CBD is thought not have the same psychoactivity as THC, and may affect the actions of THC. Studies suggest that CBD may interact with different biological targets, including cannabinoid receptors and other neurotransmitter receptors. As of 2018 the mechanism of action for its biological  effects has not been determined.  In the [place], cannabidiol has a limited approval by the Food and Drug Administration (FDA)  for treatment of only two types of epilepsy disorders. The side effects of long-term use of the drug include somnolence, decreased appetite, diarrhea, fatigue, malaise, weakness, sleeping problems, and others.  CBD remains a Schedule I drug prohibited for any use.  Legality: Some manufacturers ship CBD products nationally, an illegal action which the FDA has not enforced in 2018, with CBD remaining the subject of an FDA investigational new drug evaluation, and is not considered legal as a dietary supplement or food ingredient as of December 2018. Federal illegality has made it difficult historically to conduct research on CBD. CBD is openly sold in head shops and health food stores in some states where such sales have not been explicitly legalized.  Warning: Because it is not FDA approved for general use or treatment of pain, it is not required to undergo the same manufacturing controls as prescription drugs.  This means that the available cannabidiol (CBD) may be contaminated with THC.  If this is the case, it will trigger a positive urine drug screen (UDS) test for cannabinoids (Marijuana).  Because a positive UDS for illicit substances is a violation of our medication agreement, your opioid analgesics (pain medicine) may be permanently discontinued. (Last update: 01/11/2018) ____________________________________________________________________________________________

## 2020-05-06 NOTE — Progress Notes (Signed)
Nursing Pain Medication Assessment:  Safety precautions to be maintained throughout the outpatient stay will include: orient to surroundings, keep bed in low position, maintain call bell within reach at all times, provide assistance with transfer out of bed and ambulation.  Medication Inspection Compliance: Pill count conducted under aseptic conditions, in front of the patient. Neither the pills nor the bottle was removed from the patient's sight at any time. Once count was completed pills were immediately returned to the patient in their original bottle.  Medication: Hydrocodone/APAP Pill/Patch Count: 7 of 120 pills remain Pill/Patch Appearance: Markings consistent with prescribed medication Bottle Appearance: Standard pharmacy container. Clearly labeled. Filled Date:05 / 17 / 2021 Last Medication intake:  TodaySafety precautions to be maintained throughout the outpatient stay will include: orient to surroundings, keep bed in low position, maintain call bell within reach at all times, provide assistance with transfer out of bed and ambulation.

## 2020-05-06 NOTE — Telephone Encounter (Signed)
Sent to [facility] at patient request

## 2020-05-09 LAB — TOXASSURE SELECT 13 (MW), URINE

## 2020-05-24 ENCOUNTER — Other Ambulatory Visit: Payer: Self-pay | Admitting: Physician Assistant

## 2020-05-24 DIAGNOSIS — I872 Venous insufficiency (chronic) (peripheral): Secondary | ICD-10-CM

## 2020-05-24 DIAGNOSIS — M1612 Unilateral primary osteoarthritis, left hip: Secondary | ICD-10-CM

## 2020-05-26 ENCOUNTER — Other Ambulatory Visit: Payer: Self-pay

## 2020-05-26 ENCOUNTER — Ambulatory Visit (HOSPITAL_BASED_OUTPATIENT_CLINIC_OR_DEPARTMENT_OTHER): Payer: Medicare Other | Admitting: Pain Medicine

## 2020-05-26 ENCOUNTER — Encounter: Payer: Self-pay | Admitting: Pain Medicine

## 2020-05-26 ENCOUNTER — Ambulatory Visit
Admission: RE | Admit: 2020-05-26 | Discharge: 2020-05-26 | Disposition: A | Discharge status: Home / Self Care | Payer: Medicare Other | Source: Ambulatory Visit | Attending: Pain Medicine | Admitting: Pain Medicine

## 2020-05-26 VITALS — BP 155/74 | HR 96 | Temp 97.5°F | Resp 16 | Ht 59.0 in | Wt 100.0 lb

## 2020-05-26 DIAGNOSIS — M47816 Spondylosis without myelopathy or radiculopathy, lumbar region: Secondary | ICD-10-CM | POA: Insufficient documentation

## 2020-05-26 DIAGNOSIS — M47817 Spondylosis without myelopathy or radiculopathy, lumbosacral region: Secondary | ICD-10-CM | POA: Diagnosis present

## 2020-05-26 DIAGNOSIS — M25552 Pain in left hip: Secondary | ICD-10-CM

## 2020-05-26 DIAGNOSIS — M5137 Other intervertebral disc degeneration, lumbosacral region: Secondary | ICD-10-CM | POA: Insufficient documentation

## 2020-05-26 DIAGNOSIS — R1032 Left lower quadrant pain: Secondary | ICD-10-CM | POA: Insufficient documentation

## 2020-05-26 DIAGNOSIS — M5441 Lumbago with sciatica, right side: Secondary | ICD-10-CM | POA: Insufficient documentation

## 2020-05-26 DIAGNOSIS — G8929 Other chronic pain: Secondary | ICD-10-CM | POA: Diagnosis present

## 2020-05-26 DIAGNOSIS — M5442 Lumbago with sciatica, left side: Secondary | ICD-10-CM

## 2020-05-26 DIAGNOSIS — M533 Sacrococcygeal disorders, not elsewhere classified: Secondary | ICD-10-CM

## 2020-05-26 DIAGNOSIS — M5388 Other specified dorsopathies, sacral and sacrococcygeal region: Secondary | ICD-10-CM

## 2020-05-26 MED ORDER — METHYLPREDNISOLONE ACETATE 80 MG/ML IJ SUSP
80.0000 mg | Freq: Once | INTRAMUSCULAR | Status: AC
  Administered 2020-05-26: 80 mg via INTRA_ARTICULAR

## 2020-05-26 MED ORDER — ROPIVACAINE HCL 2 MG/ML IJ SOLN
4.0000 mL | Freq: Once | INTRAMUSCULAR | Status: AC
  Administered 2020-05-26: 4 mL via INTRA_ARTICULAR

## 2020-05-26 MED ORDER — TRIAMCINOLONE ACETONIDE 40 MG/ML IJ SUSP
40.0000 mg | Freq: Once | INTRAMUSCULAR | Status: AC
  Administered 2020-05-26: 40 mg

## 2020-05-26 MED ORDER — TRIAMCINOLONE ACETONIDE 40 MG/ML IJ SUSP
INTRAMUSCULAR | Status: AC
  Filled 2020-05-26: qty 1

## 2020-05-26 MED ORDER — MIDAZOLAM HCL 5 MG/5ML IJ SOLN
1.0000 mg | INTRAMUSCULAR | Status: DC | PRN
  Administered 2020-05-26: 1 mg via INTRAVENOUS

## 2020-05-26 MED ORDER — LIDOCAINE HCL 2 % IJ SOLN
20.0000 mL | Freq: Once | INTRAMUSCULAR | Status: AC
  Administered 2020-05-26: 400 mg

## 2020-05-26 MED ORDER — LIDOCAINE HCL 2 % IJ SOLN
INTRAMUSCULAR | Status: AC
  Filled 2020-05-26: qty 20

## 2020-05-26 MED ORDER — FENTANYL CITRATE (PF) 100 MCG/2ML IJ SOLN
INTRAMUSCULAR | Status: AC
  Filled 2020-05-26: qty 2

## 2020-05-26 MED ORDER — ROPIVACAINE HCL 2 MG/ML IJ SOLN
9.0000 mL | Freq: Once | INTRAMUSCULAR | Status: AC
  Administered 2020-05-26: 9 mL via PERINEURAL

## 2020-05-26 MED ORDER — ROPIVACAINE HCL 2 MG/ML IJ SOLN
INTRAMUSCULAR | Status: AC
  Filled 2020-05-26: qty 20

## 2020-05-26 MED ORDER — LACTATED RINGERS IV SOLN
1000.0000 mL | Freq: Once | INTRAVENOUS | Status: AC
  Administered 2020-05-26: 1000 mL via INTRAVENOUS

## 2020-05-26 MED ORDER — MIDAZOLAM HCL 5 MG/5ML IJ SOLN
INTRAMUSCULAR | Status: AC
  Filled 2020-05-26: qty 5

## 2020-05-26 MED ORDER — METHYLPREDNISOLONE ACETATE 80 MG/ML IJ SUSP
INTRAMUSCULAR | Status: AC
  Filled 2020-05-26: qty 1

## 2020-05-26 MED ORDER — FENTANYL CITRATE (PF) 100 MCG/2ML IJ SOLN
25.0000 ug | INTRAMUSCULAR | Status: DC | PRN
  Administered 2020-05-26: 50 ug via INTRAVENOUS

## 2020-05-26 NOTE — Patient Instructions (Addendum)

## 2020-05-26 NOTE — Progress Notes (Signed)
PROVIDER NOTE: Information contained herein reflects review and annotations entered in association with encounter. Interpretation of such information and data should be left to medically-trained personnel. Information provided to patient can be located elsewhere in the medical record under "Patient Instructions". Document created using STT-dictation technology, any transcriptional errors that may result from process are unintentional.    Patient: Suzanne Avila  Service Category: Procedure  Provider: Oswaldo Done, MD  DOB: 02-26-1946  DOS: 05/26/2020  Location: [facility]  MRN: [id number]  Setting: Ambulatory - outpatient  Referring Provider: Suella Grove*  Type: Established Patient  Specialty: Interventional Pain Management  PCP: Margaretann Loveless, PA-C   Primary Reason for Visit: Interventional Pain Management Treatment. CC: Back Pain (lower)  Procedure:          Anesthesia, Analgesia, Anxiolysis:  Procedure #1: Type: Medial Branch Facet Block #3 Primary Purpose: Diagnostic Region: Lumbar Level: L2, L3, L4, L5, & S1 Medial Branch Level(s) Target Area: For Lumbar Facet blocks, the target is the groove formed by the junction of the transverse process and superior articular process. For the L5 dorsal ramus, the target is the notch between superior articular process and sacral ala. For the S1 dorsal ramus, the target is the superior and lateral edge of the posterior S1 Sacral foramen. Approach: Posterior, paramedial, percutaneous approach. Laterality: Left  Procedure #2: Type: Sacroiliac Joint Block  #3  Primary Purpose: Diagnostic Region: Posterior Lumbosacral Level: PSIS (Posterior Superior Iliac Spine) Sacroiliac Joint Target Area: For upper sacroiliac joint block(s), the target is the superior and posterior margin of the sacroiliac joint. Approach: Ipsilateral approach. Laterality: Left  Type: Moderate (Conscious) Sedation combined with Local  Anesthesia Indication(s): Analgesia and Anxiety Route: Intravenous (IV) IV Access: Secured Sedation: Meaningful verbal contact was maintained at all times during the procedure  Local Anesthetic: Lidocaine 1-2%  Position: Prone   Indications: 1. 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 Spondylosis without myelopathy or radiculopathy, lumbosacral region   3. DDD (degenerative disc disease), lumbosacral   4. Chronic sacroiliac joint pain (Left)   5. Other specified dorsopathies, sacral and sacrococcygeal region   6. Chronic groin pain (Left)   7. 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 Chronic hip pain (Left)    Pain Score: Pre-procedure: 7 /10 Post-procedure: 0-No pain/10   Pre-op Assessment:  Suzanne Avila is a 74 y.o. (year old), female patient, seen today for interventional treatment. She  has a past surgical history that includes Back surgery; Eye surgery; and Carpal tunnel release (Left). Suzanne Avila has a current medication list which includes the following prescription(s): albuterol, aspirin, atorvastatin, diltiazem, dorzolamide, fluticasone-salmeterol, furosemide, hydrocodone-acetaminophen, [START ON 06/07/2020] hydrocodone-acetaminophen, [START ON 07/07/2020] hydrocodone-acetaminophen, latanoprost, magnesium gluconate, meloxicam, multi-vitamin, multiple vitamins-minerals, omeprazole, pregabalin, ramipril, ropinirole, spironolactone, and vitamin b-12, and the following Facility-Administered Medications: fentanyl and midazolam. Her primarily concern today is the Back Pain (lower)  Initial Vital Signs:  Pulse/HCG Rate: 96ECG Heart Rate: 78 Temp: 98.3 F (36.8 C) Resp: 16 BP: (!) 149/85 SpO2: 97 %  BMI: Estimated body mass index is 20.2 kg/m as calculated from the following:   Height as of this encounter: 4\' 11"  (1.499 m).   Weight as of this encounter: 100 lb (45.4 kg).  Risk Assessment: Allergies: Reviewed. She is allergic to codeine,  cyclobenzaprine, and penicillins.  Allergy Precautions: None required Coagulopathies: Reviewed. None identified.  Blood-thinner therapy: None at this time Active Infection(s): Reviewed. None identified. Suzanne Avila is  afebrile  Site Confirmation: Suzanne Avila was asked to confirm the procedure and laterality before marking the site Procedure checklist: Completed Consent: Before the procedure and under the influence of no sedative(s), amnesic(s), or anxiolytics, the patient was informed of the treatment options, risks and possible complications. To fulfill our ethical and legal obligations, as recommended by the American Medical Association's Code of Ethics, I have informed the patient of my clinical impression; the nature and purpose of the treatment or procedure; the risks, benefits, and possible complications of the intervention; the alternatives, including doing nothing; the risk(s) and benefit(s) of the alternative treatment(s) or procedure(s); and the risk(s) and benefit(s) of doing nothing. The patient was provided information about the general risks and possible complications associated with the procedure. These may include, but are not limited to: failure to achieve desired goals, infection, bleeding, organ or nerve damage, allergic reactions, paralysis, and death. In addition, the patient was informed of those risks and complications associated to Spine-related procedures, such as failure to decrease pain; infection (i.e.: Meningitis, epidural or intraspinal abscess); bleeding (i.e.: epidural hematoma, subarachnoid hemorrhage, or any other type of intraspinal or peri-dural bleeding); organ or nerve damage (i.e.: Any type of peripheral nerve, nerve root, or spinal cord injury) with subsequent damage to sensory, motor, and/or autonomic systems, resulting in permanent pain, numbness, and/or weakness of one or several areas of the body; allergic reactions; (i.e.: anaphylactic reaction); and/or  death. Furthermore, the patient was informed of those risks and complications associated with the medications. These include, but are not limited to: allergic reactions (i.e.: anaphylactic or anaphylactoid reaction(s)); adrenal axis suppression; blood sugar elevation that in diabetics may result in ketoacidosis or comma; water retention that in patients with history of congestive heart failure may result in shortness of breath, pulmonary edema, and decompensation with resultant heart failure; weight gain; swelling or edema; medication-induced neural toxicity; particulate matter embolism and blood vessel occlusion with resultant organ, and/or nervous system infarction; and/or aseptic necrosis of one or more joints. Finally, the patient was informed that Medicine is not an exact science; therefore, there is also the possibility of unforeseen or unpredictable risks and/or possible complications that may result in a catastrophic outcome. The patient indicated having understood very clearly. We have given the patient no guarantees and we have made no promises. Enough time was given to the patient to ask questions, all of which were answered to the patient's satisfaction. Suzanne Avila has indicated that she wanted to continue with the procedure. Attestation: I, the ordering provider, attest that I have discussed with the patient the benefits, risks, side-effects, alternatives, likelihood of achieving goals, and potential problems during recovery for the procedure that I have provided informed consent. Date  Time: 05/26/2020  8:31 AM  Pre-Procedure Preparation:  Monitoring: As per clinic protocol. Respiration, ETCO2, SpO2, BP, heart rate and rhythm monitor placed and checked for adequate function Safety Precautions: Patient was assessed for positional comfort and pressure points before starting the procedure. Time-out: I initiated and conducted the "Time-out" before starting the procedure, as per protocol. The  patient was asked to participate by confirming the accuracy of the "Time Out" information. Verification of the correct person, site, and procedure were performed and confirmed by me, the nursing staff, and the patient. "Time-out" conducted as per Joint Commission's Universal Protocol (UP.01.01.01). Time: 0920  Description of Procedure #1:   Time-out: "Time-out" completed before starting procedure, as per protocol. Area Prepped: Entire Posterior Lumbosacral Region DuraPrep (Iodine Povacrylex [0.7% available iodine] and Isopropyl  Alcohol, 74% w/w) Safety Precautions: Aspiration looking for blood return was conducted prior to all injections. At no point did we inject any substances, as a needle was being advanced. No attempts were made at seeking any paresthesias. Safe injection practices and needle disposal techniques used. Medications properly checked for expiration dates. SDV (single dose vial) medications used.  Description of the Procedure: Protocol guidelines were followed. The patient was placed in position over the fluoroscopy table. The target area was identified and the area prepped in the usual manner. Skin & deeper tissues infiltrated with local anesthetic. Appropriate amount of time allowed to pass for local anesthetics to take effect. The procedure needle was introduced through the skin, ipsilateral to the reported pain, and advanced to the target area. Employing the "Medial Branch Technique", the needles were advanced to the angle made by the superior and medial portion of the transverse process, and the lateral and inferior portion of the superior articulating process of the targeted vertebral bodies. This area is known as "Burton's Eye" or the "Eye of the [place]". A procedure needle was introduced through the skin, and this time advanced to the angle made by the superior and medial border of the sacral ala, and the lateral border of the S1 vertebral body. This last needle was later  repositioned at the superior and lateral border of the posterior S1 foramen. Negative aspiration confirmed. Solution injected in intermittent fashion, asking for systemic symptoms every 0.5cc of injectate. The needles were then removed and the area cleansed, making sure to leave some of the prepping solution back to take advantage of its long term bactericidal properties. Start Time: 0920 hrs. Materials:  Needle(s) Type: Spinal Needle Gauge: 22G Length: 3.5-in Medication(s): Please see orders for medications and dosing details.  Description of Procedure # 2:   Area Prepped: Entire Posterior Lumbosacral Region DuraPrep (Iodine Povacrylex [0.7% available iodine] and Isopropyl Alcohol, 74% w/w) Safety Precautions: Aspiration looking for blood return was conducted prior to all injections. At no point did we inject any substances, as a needle was being advanced. No attempts were made at seeking any paresthesias. Safe injection practices and needle disposal techniques used. Medications properly checked for expiration dates. SDV (single dose vial) medications used. Description of the Procedure: Protocol guidelines were followed. The patient was placed in position over the fluoroscopy table. The target area was identified and the area prepped in the usual manner. Skin & deeper tissues infiltrated with local anesthetic. Appropriate amount of time allowed to pass for local anesthetics to take effect. The procedure needle was advanced under fluoroscopic guidance into the sacroiliac joint until a firm endpoint was obtained. Proper needle placement secured. Negative aspiration confirmed. Solution injected in intermittent fashion, asking for systemic symptoms every 0.5cc of injectate. The needles were then removed and the area cleansed, making sure to leave some of the prepping solution back to take advantage of its long term bactericidal properties. Vitals:   05/26/20 0928 05/26/20 0932 05/26/20 0942 05/26/20 0952   BP: (!) 169/78 133/63 (!) 142/96 (!) 155/74  Pulse:      Resp: 16 16 16    Temp:  (!) 97.5 F (36.4 C)  (!) 97.5 F (36.4 C)  SpO2: 97% 100% 97% 100%  Weight:      Height:        End Time: 0927 hrs. Materials:  Needle(s) Type: Spinal Needle Gauge: 22G Length: 3.5-in Medication(s): Please see orders for medications and dosing details.  Imaging Guidance (Spinal):  Type of Imaging Technique: Fluoroscopy Guidance (Spinal) Indication(s): Assistance in needle guidance and placement for procedures requiring needle placement in or near specific anatomical locations not easily accessible without such assistance. Exposure Time: Please see nurses notes. Contrast: None used. Fluoroscopic Guidance: I was personally present during the use of fluoroscopy. "Tunnel Vision Technique" used to obtain the best possible view of the target area. Parallax error corrected before commencing the procedure. "Direction-depth-direction" technique used to introduce the needle under continuous pulsed fluoroscopy. Once target was reached, antero-posterior, oblique, and lateral fluoroscopic projection used confirm needle placement in all planes. Images permanently stored in EMR. Interpretation: No contrast injected. I personally interpreted the imaging intraoperatively. Adequate needle placement confirmed in multiple planes. Permanent images saved into the patient's record.  Antibiotic Prophylaxis:   Anti-infectives (From admission, onward)   None     Indication(s): None identified  Post-operative Assessment:  Post-procedure Vital Signs:  Pulse/HCG Rate: 9672 Temp: (!) 97.5 F (36.4 C) Resp: 16 BP: (!) 155/74 SpO2: 100 %  EBL: None  Complications: No immediate post-treatment complications observed by team, or reported by patient.  Note: The patient tolerated the entire procedure well. A repeat set of vitals were taken after the procedure and the patient was kept under observation following  institutional policy, for this type of procedure. Post-procedural neurological assessment was performed, showing return to baseline, prior to discharge. The patient was provided with post-procedure discharge instructions, including a section on how to identify potential problems. Should any problems arise concerning this procedure, the patient was given instructions to immediately contact us, at any time, without hesitation. In any case, we plan to contact the patient by telephone for a follow-up status report regarding this interventional procedure.  Comments:  No additional relevant information.  Plan of Care  Orders:  Orders Placed This Encounter  Procedures  . LUMBAR FACET(MEDIAL BRANCH NERVE BLOCK) MBNB    Scheduling Instructions:     Procedure: Lumbar facet block (AKA.: Lumbosacral medial branch nerve block)     Side: Left-sided     Level: L3-4, L4-5, & L5-S1 Facets (L2, L3, L4, L5, & S1 Medial Branch Nerves)     Sedation: With Sedation.     Timeframe: Today    Order Specific Question:   Where will this procedure be performed?    Answer:   [facility]  . SACROILIAC JOINT INJECTION    Scheduling Instructions:     Side: Left-sided     Sedation: With Sedation.     Timeframe: Today    Order Specific Question:   Where will this procedure be performed?    Answer:   [facility]  . DG PAIN [facility] C-ARM 1-60 MIN NO REPORT    Intraoperative interpretation by procedural physician at [facility].    Standing Status:   Standing    Number of Occurrences:   1    Order Specific Question:   Reason for exam:    Answer:   Assistance in needle guidance and placement for procedures requiring needle placement in or near specific anatomical locations not easily accessible without such assistance.  . Informed Consent Details: Physician/Practitioner Attestation; Transcribe to consent form and obtain patient signature    Nursing Order: Transcribe to consent form and obtain  patient signature. Note: Always confirm laterality of pain with Suzanne Avila, before procedure. Procedure: Lumbar Facet Block  under fluoroscopic guidance Indication/Reason: Low Back Pain, with our without leg pain, due to Facet Joint Arthralgia (Joint Pain) known as Lumbar  Facet Syndrome, secondary to Lumbar, and/or Lumbosacral Spondylosis (Arthritis of the Spine), without myelopathy or radiculopathy (Nerve Damage). Provider Attestation: I, Ellayna Hilligoss A. Laban Emperor, MD, (Pain Management Specialist), the physician/practitioner, attest that I have discussed with the patient the benefits, risks, side effects, alternatives, likelihood of achieving goals and potential problems during recovery for the procedure that I have provided informed consent.  . Provide equipment / supplies at bedside    Equipment required: Single use, disposable, "Block Tray"    Standing Status:   Standing    Number of Occurrences:   1    Order Specific Question:   Specify    Answer:   Block Tray  . Informed Consent Details: Physician/Practitioner Attestation; Transcribe to consent form and obtain patient signature    Provider Attestation: I, Keshav Winegar A. Laban Emperor, MD, (Pain Management Specialist), the physician/practitioner, attest that I have discussed with the patient the benefits, risks, side effects, alternatives, likelihood of achieving goals and potential problems during recovery for the procedure that I have provided informed consent.    Scheduling Instructions:     Procedure: Sacroiliac Joint Block     Indication/Reason: Chronic Low Back and Hip Pain secondary to Sacroiliac Joint Pain (Arthralgia/Arthropathy)     Nursing Order: Transcribe to consent form and obtain patient signature.     Note: Always confirm laterality of pain with Suzanne Avila, before procedure.   Chronic Opioid Analgesic:  Hydrocodone/APAP 7.5/325, 1 tab PO q 6 hrs (30 mg/day of hydrocodone) MME/day:30 mg/day.   Medications ordered for  procedure: Meds ordered this encounter  Medications  . lidocaine (XYLOCAINE) 2 % (with pres) injection 400 mg  . lactated ringers infusion 1,000 mL  . midazolam (VERSED) 5 MG/5ML injection 1-2 mg    Make sure Flumazenil is available in the pyxis when using this medication. If oversedation occurs, administer 0.2 mg IV over 15 sec. If after 45 sec no response, administer 0.2 mg again over 1 min; may repeat at 1 min intervals; not to exceed 4 doses (1 mg)  . fentaNYL (SUBLIMAZE) injection 25-50 mcg    Make sure Narcan is available in the pyxis when using this medication. In the event of respiratory depression (RR< 8/min): Titrate NARCAN (naloxone) in increments of 0.1 to 0.2 mg IV at 2-3 minute intervals, until desired degree of reversal.  . ropivacaine (PF) 2 mg/mL (0.2%) (NAROPIN) injection 9 mL  . triamcinolone acetonide (KENALOG-40) injection 40 mg  . methylPREDNISolone acetate (DEPO-MEDROL) injection 80 mg  . ropivacaine (PF) 2 mg/mL (0.2%) (NAROPIN) injection 4 mL   Medications administered: We administered lidocaine, lactated ringers, midazolam, fentaNYL, ropivacaine (PF) 2 mg/mL (0.2%), triamcinolone acetonide, methylPREDNISolone acetate, and ropivacaine (PF) 2 mg/mL (0.2%).  See the medical record for exact dosing, route, and time of administration.  Follow-up plan:   Return in about 2 weeks (around 06/09/2020) for VV(6min), PP-(on procedure day).       Considering:   Diagnostic bilateral IA hip joint injection  Possible bilateral hip joint RFA  Possible bilateral lumbar facet RFA  Possible left SI joint RFA  Diagnostic bilateral L4-L5 TFESI  Diagnostic caudal ESI  Diagnostic caudal epidurogram  Possible RACZ epidural lysis of adhesions  Diagnostic left L2-3 LESI  Possible bilateral Lumbar SCS trial implant    Palliative PRN treatment(s):   Diagnostic right lumbar facet block #2  Diagnostic left lumbar facet block #3  Diagnostic left SI joint block #3     Recent  Visits Date Type Provider Dept  05/06/20 Office Visit Delano Metz,  MD [facility]  Showing recent visits within past 90 days and meeting all other requirements Today's Visits Date Type Provider Dept  05/26/20 Procedure visit Delano Metz, MD [facility]  Showing today's visits and meeting all other requirements Future Appointments Date Type Provider Dept  06/09/20 Appointment Delano Metz, MD [facility]  07/27/20 Appointment Delano Metz, MD [facility]  Showing future appointments within next 90 days and meeting all other requirements  Disposition: Discharge home  Discharge (Date  Time): 05/26/2020; 0953 hrs.   Primary Care Physician: Margaretann Loveless, PA-C Location: [facility] Note by: Oswaldo Done, MD Date: 05/26/2020; Time: 10:15 AM  Disclaimer:  Medicine is not an Visual merchandiser. The only guarantee in medicine is that nothing is guaranteed. It is important to note that the decision to proceed with this intervention was based on the information collected from the patient. The Data and conclusions were drawn from the patient's questionnaire, the interview, and the physical examination. Because the information was provided in large part by the patient, it cannot be guaranteed that it has not been purposely or unconsciously manipulated. Every effort has been made to obtain as much relevant data as possible for this evaluation. It is important to note that the conclusions that lead to this procedure are derived in large part from the available data. Always take into account that the treatment will also be dependent on availability of resources and existing treatment guidelines, considered by other Pain Management Practitioners as being common knowledge and practice, at the time of the intervention. For Medico-Legal purposes, it is also important to point out that variation in procedural  techniques and pharmacological choices are the acceptable norm. The indications, contraindications, technique, and results of the above procedure should only be interpreted and judged by a Board-Certified Interventional Pain Specialist with extensive familiarity and expertise in the same exact procedure and technique.

## 2020-05-27 ENCOUNTER — Telehealth: Payer: Self-pay

## 2020-05-27 NOTE — Telephone Encounter (Signed)
Attempted to call patient for PP. Unable to leave a message. Sounded like phone was picked up and put back down  On receiver.

## 2020-06-08 ENCOUNTER — Encounter: Payer: Self-pay | Admitting: Pain Medicine

## 2020-06-09 ENCOUNTER — Ambulatory Visit: Payer: Medicare Other | Attending: Pain Medicine | Admitting: Pain Medicine

## 2020-06-09 ENCOUNTER — Other Ambulatory Visit: Payer: Self-pay

## 2020-06-09 DIAGNOSIS — M47817 Spondylosis without myelopathy or radiculopathy, lumbosacral region: Secondary | ICD-10-CM

## 2020-06-09 NOTE — Progress Notes (Signed)
Patient: Suzanne Avila  Service Category: E/M  Provider: Gaspar Cola, MD  DOB: [birth year]  DOS: 06/09/2020  Location: Office  MRN: [id number]  Setting: Ambulatory outpatient  Referring Provider: Florian Buff*  Type: Established Patient  Specialty: Interventional Pain Management  PCP: Mar Daring, PA-C  Location: Remote location  Delivery: TeleHealth     Virtual Encounter - Pain Management PROVIDER NOTE: Information contained herein reflects review and annotations entered in association with encounter. Interpretation of such information and data should be left to medically-trained personnel. Information provided to patient can be located elsewhere in the medical record under "Patient Instructions". Document created using STT-dictation technology, any transcriptional errors that may result from process are unintentional.    Contact & Pharmacy Preferred: [phone] Home: [phone] (home) Mobile: [phone] (mobile) E-mail: No e-mail address on record  [place], [place] [address] Phone: [phone] Fax: [place], [place] [address] [place] [address] Phone: [phone] Fax: [phone]   Pre-screening  Suzanne Avila offered "in-person" vs "virtual" encounter. She indicated preferring virtual for this encounter.   Reason COVID-19*  Social distancing based on CDC and AMA recommendations.   I contacted Suzanne Avila on 06/09/2020 via telephone.      I clearly identified myself as Gaspar Cola, MD. I verified that I was speaking with the correct person using two identifiers (Name: KORRIE HOFBAUER, and date of birth: 1946-04-15).  Consent I sought verbal advanced consent from Suzanne Avila for virtual visit interactions. I informed Suzanne Avila of possible security and privacy concerns, risks,  and limitations associated with providing "not-in-person" medical evaluation and management services. I also informed Suzanne Avila of the availability of "in-person" appointments. Finally, I informed her that there would be a charge for the virtual visit and that she could be  personally, fully or partially, financially responsible for it. Ms. Byer expressed understanding and agreed to proceed.   Historic Elements   Suzanne Avila is a 74 y.o. year old, female patient evaluated today after her last contact with our practice on 05/27/2020. Suzanne Avila  has a past medical history of Glaucoma, Hyperlipidemia, and Hypertension. She also  has a past surgical history that includes Back surgery; Eye surgery; and Carpal tunnel release (Left). Ms. Stanek has a current medication list which includes the following prescription(s): albuterol, aspirin, atorvastatin, diltiazem, dorzolamide, fluticasone-salmeterol, furosemide, hydrocodone-acetaminophen, [START ON 07/07/2020] hydrocodone-acetaminophen, latanoprost, magnesium gluconate, meloxicam, multi-vitamin, multiple vitamins-minerals, omeprazole, pregabalin, ramipril, ropinirole, spironolactone, vitamin b-12, and hydrocodone-acetaminophen. She  reports that she has been smoking cigarettes. She has a 50.00 pack-year smoking history. She has never used smokeless tobacco. She reports current alcohol use of about 35.0 standard drinks of alcohol per week. She reports that she does not use drugs. Suzanne Avila is allergic to codeine, cyclobenzaprine, and penicillins.   HPI  Today, she is being contacted for a post-procedure assessment.  According to the patient she did rather well after this left-sided lumbar facet and SI joint injection.  Her pain is at least 75% gone.  She describes that the sharp pain that she was experiencing is completely gone.  She is very happy at this time and refers not needing anything else for now.  We have encouraged the patient to give [place]  a call if she needs anything from [place].  Post-Procedure Evaluation  Procedure (05/26/2020):  Diagnostic left lumbar facet block #3 + left SI joint block #3 under fluoroscopic guidance and IV sedation Pre-procedure pain level: 7/10 Post-procedure: 0/10 (100% relief)  Sedation: Please see nurses note.  Effectiveness during initial hour after procedure(Ultra-Short Term Relief): 99 %.  Local anesthetic used: Long-acting (4-6 hours) Effectiveness: Defined as any analgesic benefit obtained secondary to the administration of local anesthetics. This carries significant diagnostic value as to the etiological location, or anatomical origin, of the pain. Duration of benefit is expected to coincide with the duration of the local anesthetic used.  Effectiveness during initial 4-6 hours after procedure(Short-Term Relief): 99 %.  Long-term benefit: Defined as any relief past the pharmacologic duration of the local anesthetics.  Effectiveness past the initial 6 hours after procedure(Long-Term Relief): 75 %.  Current benefits: Defined as benefit that persist at this time.   Analgesia:  75% relief Function: Suzanne Avila reports improvement in function ROM: Suzanne Avila reports improvement in ROM  Pharmacotherapy Assessment  Analgesic: Hydrocodone/APAP 7.5/325, 1 tab PO q 6 hrs (30 mg/day of hydrocodone) MME/day:30 mg/day.   Monitoring: [place] PMP: PDMP reviewed during this encounter.       Pharmacotherapy: No side-effects or adverse reactions reported. Compliance: No problems identified. Effectiveness: Clinically acceptable. Plan: Refer to "POC".  UDS:  Summary  Date Value Ref Range Status  05/06/2020 Note  Final    Comment:    ==================================================================== Tox[employer] Select 13 (MW) ==================================================================== Specimen Alert Note: Urinary creatinine is low; ability to detect some drugs may be compromised. Interpret results  with caution. (Creatinine) ==================================================================== Test                             Result       Flag       Units  Drug Present and Declared for Prescription Verification   Hydrocodone                    3460         EXPECTED   ng/mg creat   Hydromorphone                  1380         EXPECTED   ng/mg creat   Dihydrocodeine                 580          EXPECTED   ng/mg creat   Norhydrocodone                 2430         EXPECTED   ng/mg creat    Sources of hydrocodone include scheduled prescription medications.    Hydromorphone, dihydrocodeine and norhydrocodone are expected    metabolites of hydrocodone. Hydromorphone and dihydrocodeine are    also available as scheduled prescription medications.  ==================================================================== Test                      Result    Flag   Units      Ref Range   Creatinine              10        LL     mg/dL      >=20 ==================================================================== Declared Medications:  The flagging and interpretation on this report are based on the  following declared medications.  Unexpected results may arise from  inaccuracies in the declared medications.   **  Note: The testing scope of this panel includes these medications:   Hydrocodone   **Note: The testing scope of this panel does not include the  following reported medications:   Acetaminophen  Albuterol  Aspirin  Atorvastatin  Cyanocobalamin  Diltiazem  Dorzolamide  Fluticasone (Advair)  Furosemide (Lasix)  Latanoprost  Magnesium  Meloxicam  Multivitamin  Omeprazole  Pregabalin (Lyrica)  Ramipril  Ropinirole  Salmeterol (Advair)  Spironolactone ==================================================================== For clinical consultation, please call [phone]. ====================================================================     Laboratory Chemistry Profile    Renal Lab Results  Component Value Date   BUN 12 02/03/2020   CREATININE 1.04 (H) 02/03/2020   BCR 12 02/03/2020   GFRAA 61 02/03/2020   GFRNONAA 53 (L) 02/03/2020     Hepatic Lab Results  Component Value Date   AST 15 02/03/2020   ALT 10 02/03/2020   ALBUMIN 4.2 02/03/2020   ALKPHOS 96 02/03/2020     Electrolytes Lab Results  Component Value Date   NA 137 02/03/2020   K 4.4 02/03/2020   CL 100 02/03/2020   CALCIUM 10.1 02/03/2020   MG 1.7 02/14/2017     Bone Lab Results  Component Value Date   VD25OH 45.9 02/03/2020     Inflammation (CRP: Acute Phase) (ESR: Chronic Phase) Lab Results  Component Value Date   CRP <0.8 02/14/2017   ESRSEDRATE 20 02/14/2017       Note: Above Lab results reviewed.   Imaging  DG PAIN CLINIC C-ARM 1-60 MIN NO REPORT Fluoro was used, but no Radiologist interpretation will be provided.  Please refer to "NOTES" tab for provider progress note.  Assessment  There were no encounter diagnoses.  Plan of Care  Problem-specific:  No problem-specific Assessment & Plan notes found for this encounter.  Suzanne Avila has a current medication list which includes the following long-term medication(s): albuterol, atorvastatin, diltiazem, fluticasone-salmeterol, furosemide, hydrocodone-acetaminophen, [START ON 07/07/2020] hydrocodone-acetaminophen, meloxicam, omeprazole, pregabalin, ramipril, ropinirole, spironolactone, and hydrocodone-acetaminophen.  Pharmacotherapy (Medications Ordered): No orders of the defined types were placed in this encounter.  Orders:  No orders of the defined types were placed in this encounter.  Follow-up plan:   Return for scheduled encounter.      Considering:   Diagnostic bilateral IA hip joint injection  Possible bilateral hip joint RFA  Possible bilateral lumbar facet RFA  Possible left SI joint RFA  Diagnostic bilateral L4-L5 TFESI  Diagnostic caudal ESI  Diagnostic caudal epidurogram   Possible RACZ epidural lysis of adhesions  Diagnostic left L2-3 LESI  Possible bilateral Lumbar SCS trial implant    Palliative PRN treatment(s):   Diagnostic right lumbar facet block #2  Diagnostic left lumbar facet block #3  Diagnostic left SI joint block #3      Recent Visits Date Type Provider Dept  05/26/20 Procedure visit Milinda Pointer, MD [facility]  05/06/20 Office Visit Milinda Pointer, MD [facility]  Showing recent visits within past 90 days and meeting all other requirements Today's Visits Date Type Provider Dept  06/09/20 Telemedicine Milinda Pointer, MD [facility]  Showing today's visits and meeting all other requirements Future Appointments Date Type Provider Dept  07/27/20 Appointment Milinda Pointer, MD [facility]  Showing future appointments within next 90 days and meeting all other requirements  I discussed the assessment and treatment plan with the patient. The patient was provided an opportunity to ask questions and all were answered. The patient agreed with the plan and demonstrated an understanding of  the instructions.  Patient advised to call back or seek an in-person evaluation if the symptoms or condition worsens.  Duration of encounter: 13 minutes.  Note by: Gaspar Cola, MD Date: 06/09/2020; Time: 9:24 AM

## 2020-06-15 ENCOUNTER — Other Ambulatory Visit: Payer: Self-pay | Admitting: Physician Assistant

## 2020-06-15 DIAGNOSIS — G2581 Restless legs syndrome: Secondary | ICD-10-CM

## 2020-06-15 DIAGNOSIS — K219 Gastro-esophageal reflux disease without esophagitis: Secondary | ICD-10-CM

## 2020-06-15 MED ORDER — ROPINIROLE HCL 2 MG PO TABS: 2.0000 mg | ORAL_TABLET | Freq: Two times a day (BID) | ORAL | 2 refills | Status: DC

## 2020-06-15 MED ORDER — OMEPRAZOLE 40 MG PO CPDR: 40.0000 mg | DELAYED_RELEASE_CAPSULE | Freq: Every day | ORAL | 3 refills | Status: DC

## 2020-06-15 NOTE — Telephone Encounter (Signed)
Requested medication (s) are due for refill today: Yes  Requested medication (s) are on the active medication list: Yes  Last refill:  04/08/19  Future visit scheduled: Yes  Notes to clinic: Unable to refill, expired Rx     Requested Prescriptions  Pending Prescriptions Disp Refills   omeprazole (PRILOSEC) 40 MG capsule 90 capsule 3    Sig: Take 1 capsule (40 mg total) by mouth daily.      Gastroenterology: Proton Pump Inhibitors Passed - 06/15/2020 11:29 AM      Passed - Valid encounter within last 12 months    Recent Outpatient Visits           1 month ago Benign essential HTN   [facility] [place], [place], [place]   7 months ago RLS (restless legs syndrome)   [facility] [place], [place]   11 months ago Swelling of right elbow   [facility] [place], [place], [place]   1 year ago Venous insufficiency   [facility] [place], [place], [place]   1 year ago Weight loss   [facility] [place], [place], [place]       Future Appointments             In 1 month Burnette, [place], PA-C [facility]             Signed Prescriptions Disp Refills   rOPINIRole (REQUIP) 2 MG tablet 180 tablet 2    Sig: Take 1 tablet (2 mg total) by mouth 2 (two) times daily.      Neurology:  Parkinsonian Agents Failed - 06/15/2020 11:29 AM      Failed - Last BP in normal range    BP Readings from Last 1 Encounters:  05/26/20 (!) 155/74          Passed - Valid encounter within last 12 months    Recent Outpatient Visits           1 month ago Benign essential HTN   [facility] [place], [place], [place]   7 months ago RLS (restless legs syndrome)   [facility] [place], [place]   11 months ago Swelling of right elbow   [facility] [place], Alessandra Be[place], [place]   1 year ago Venous insufficiency   [facility]  [place], [place]andra [place], [place]   1 year ago Weight loss   [facility], [place], [place]       Future Appointments             In 1 month Burnette, Alessandra [place], PA-C [facility]

## 2020-06-15 NOTE — Telephone Encounter (Signed)
Pt request refill   omeprazole (PRILOSEC) 40 MG capsule  rOPINIRole (REQUIP) 2 MG tablet   [facility] - [place], [place] - [address] Phone:  [phone]  Fax:  [phone]

## 2020-07-03 ENCOUNTER — Ambulatory Visit (INDEPENDENT_AMBULATORY_CARE_PROVIDER_SITE_OTHER): Payer: Medicare Other | Admitting: Nurse Practitioner

## 2020-07-03 ENCOUNTER — Encounter (INDEPENDENT_AMBULATORY_CARE_PROVIDER_SITE_OTHER): Payer: Self-pay | Admitting: Nurse Practitioner

## 2020-07-03 ENCOUNTER — Ambulatory Visit (INDEPENDENT_AMBULATORY_CARE_PROVIDER_SITE_OTHER): Payer: Medicare Other

## 2020-07-03 ENCOUNTER — Other Ambulatory Visit: Payer: Self-pay

## 2020-07-03 VITALS — BP 197/85 | HR 94 | Resp 16 | Wt 100.0 lb

## 2020-07-03 DIAGNOSIS — F17200 Nicotine dependence, unspecified, uncomplicated: Secondary | ICD-10-CM

## 2020-07-03 DIAGNOSIS — E7800 Pure hypercholesterolemia, unspecified: Secondary | ICD-10-CM

## 2020-07-03 DIAGNOSIS — I701 Atherosclerosis of renal artery: Secondary | ICD-10-CM

## 2020-07-03 DIAGNOSIS — I10 Essential (primary) hypertension: Secondary | ICD-10-CM | POA: Diagnosis not present

## 2020-07-07 ENCOUNTER — Encounter (INDEPENDENT_AMBULATORY_CARE_PROVIDER_SITE_OTHER): Payer: Self-pay | Admitting: Nurse Practitioner

## 2020-07-07 NOTE — Progress Notes (Signed)
Subjective:    Patient ID: Suzanne Avila, female    DOB: 05-12-1946, 74 y.o.   MRN: [id number] Chief Complaint  Patient presents with  . 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 There have been no interval changes in the patient's blood pressure control.  The patient's blood pressure is elevated today however she notes that she has not taken all of her medications morning and is unsure as to which she did not take.  He denies any major changes in is medications.  The patient denies headache or flushing.  No flank or unusual back pain.    There have been no significant changes to the patient's overall health care.  No interval shortening of the patient's walking distance or new symptoms consistent with claudication.  The patient denies the  development of rest pain symptoms. No new ulcers or wounds have occurred since the last visit.  The patient denies amaurosis fugax or recent TIA symptoms. There are no recent neurological changes noted. The patient denies history of DVT, PE or superficial thrombophlebitis. The patient denies recent episodes of angina or shortness of breath.   Duplex ultrasound of the abnormal size right kidney with a kidney length of 6.6 cm.  This is consistent with previous studies.  The left kidney has no evidence of left renal artery stenosis.  It is 11 cm in size.   Review of Systems  Eyes: Negative for visual disturbance.  Respiratory: Negative for chest tightness.   Neurological: Negative for headaches.  All other systems reviewed and are negative.      Objective:   Physical Exam Vitals reviewed.  HENT:     Head: Normocephalic.  Cardiovascular:     Rate and Rhythm: Normal rate and regular rhythm.     Pulses: Normal pulses.     Heart sounds: Normal heart sounds.  Pulmonary:     Effort: Pulmonary effort is  normal.     Breath sounds: Normal breath sounds.  Skin:    General: Skin is warm and dry.  Neurological:     Mental Status: She is alert and oriented to person, place, and time.  Psychiatric:        Mood and Affect: Mood normal.        Behavior: Behavior normal.        Thought Content: Thought content normal.        Judgment: Judgment normal.     BP (!) 197/85 (BP Location: Right Arm)   Pulse 94   Resp 16   Wt 100 lb (45.4 kg)   BMI 20.20 kg/m   Past Medical History:  Diagnosis Date  . Glaucoma   . Hyperlipidemia   . Hypertension     Social History   Socioeconomic History  . Marital status: Married    Spouse name: Not on file  . Number of children: 2  . Years of education: Not on file  . Highest education level: 12th grade  Occupational History  . Occupation: disabled  . Occupation: retired  Tobacco Use  . Smoking status: Current Every Day Smoker    Packs/day: 1.00    Years: 50.00    Pack years: 50.00    Types: Cigarettes  . Smokeless tobacco: Never Used  Vaping Use  . Vaping Use: Former  Substance and Sexual Activity  . Alcohol use: Yes  Alcohol/week: 35.0 standard drinks    Types: 35 Cans of beer per week    Comment: 4-5 beers every day  . Drug use: No  . Sexual activity: Not on file  Other Topics Concern  . Not on file  Social History Narrative  . Not on file   Social Determinants of Health   Financial Resource Strain: Low Risk   . Difficulty of Paying Living Expenses: Not hard at all  Food Insecurity: No Food Insecurity  . Worried About Programme researcher, broadcasting/film/video in the Last Year: Never true  . Ran Out of Food in the Last Year: Never true  Transportation Needs: No Transportation Needs  . Lack of Transportation (Medical): No  . Lack of Transportation (Non-Medical): No  Physical Activity: Inactive  . Days of Exercise per Week: 0 days  . Minutes of Exercise per Session: 0 min  Stress: No Stress Concern Present  . Feeling of Stress : Only a little    Social Connections: Moderately Isolated  . Frequency of Communication with Friends and Family: Three times a week  . Frequency of Social Gatherings with Friends and Family: Once a week  . Attends Religious Services: Never  . Active Member of Clubs or Organizations: No  . Attends Banker Meetings: Never  . Marital Status: Married  Catering manager Violence: Not At Risk  . Fear of Current or Ex-Partner: No  . Emotionally Abused: No  . Physically Abused: No  . Sexually Abused: No    Past Surgical History:  Procedure Laterality Date  . BACK SURGERY    . CARPAL TUNNEL RELEASE Left   . EYE SURGERY      Family History  Problem Relation Age of Onset  . Cancer Mother   . Cancer Father     Allergies  Allergen Reactions  . Codeine Itching  . Cyclobenzaprine Itching  . Penicillins     itching       Assessment & Plan:   1. Benign essential HTN Continue antihypertensive medications as already ordered, these medications have been reviewed and there are no changes at this time.  The patient does endorse that she took some medications this morning however she does not know which medications.  It is wholly possible that she has not taken her blood pressure medicine today.  Patient is advised to take the rest of her medication when she returns home to try to treat her hypertension.  If it remains elevated she should seek urgent care.   2. Renal artery stenosis (HCC) Recommend:  Today the patient's noninvasive studies show minimal evidence of carotid stenosis.  We will continue to monitor due to a solitary kidney.  Patient will follow up in 1 year with noninvasive studies. The patient voices understanding of this plan and agrees.     3. Compulsive tobacco user syndrome Smoking cessation was discussed, 3-10 minutes spent on this topic specifically   4. Hypercholesterolemia without hypertriglyceridemia Continue statin as ordered and reviewed, no changes at this  time    Current Outpatient Medications on File Prior to Visit  Medication Sig Dispense Refill  . albuterol (PROVENTIL) (2.5 MG/3ML) 0.083% nebulizer solution Take 3 mLs (2.5 mg total) by nebulization every 6 (six) hours as needed for wheezing or shortness of breath. 150 mL 1  . aspirin 325 MG tablet Take 1 tablet (325 mg total) by mouth daily. 90 tablet 3  . atorvastatin (LIPITOR) 40 MG tablet Take 1 tablet (40 mg total) by mouth daily.  90 tablet 1  . diltiazem (CARDIZEM) 120 MG tablet Take 1 tablet (120 mg total) by mouth daily. 90 tablet 1  . dorzolamide (TRUSOPT) 2 % ophthalmic solution Place 1 drop into both eyes 3 (three) times daily. (Patient taking differently: Place 1 drop into both eyes 2 (two) times daily. ) 10 mL 3  . Fluticasone-Salmeterol (ADVAIR DISKUS) 250-50 MCG/DOSE AEPB USE 1 INHALATION TWICE A DAY 180 each 3  . furosemide (LASIX) 20 MG tablet TAKE 1 TABLET DAILY 90 tablet 1  . HYDROcodone-acetaminophen (NORCO) 7.5-325 MG tablet Take 1 tablet by mouth every 6 (six) hours as needed for severe pain. Must last 30 days 120 tablet 0  . HYDROcodone-acetaminophen (NORCO) 7.5-325 MG tablet Take 1 tablet by mouth every 6 (six) hours as needed for severe pain. Must last 30 days 120 tablet 0  . latanoprost (XALATAN) 0.005 % ophthalmic solution Place 1 drop into both eyes at bedtime. 2.5 mL 3  . magnesium gluconate (MAGONATE) 500 MG tablet Take 500 mg by mouth daily.    . meloxicam (MOBIC) 15 MG tablet TAKE 1 TABLET DAILY 90 tablet 1  . Multiple Vitamin (MULTI-VITAMIN) tablet Take 1 tablet by mouth daily.     . Multiple Vitamins-Minerals (VITAMIN D3 COMPLETE PO) Take by mouth.    Marland Kitchen omeprazole (PRILOSEC) 40 MG capsule Take 1 capsule (40 mg total) by mouth daily. 90 capsule 3  . pregabalin (LYRICA) 150 MG capsule Take 1 capsule (150 mg total) by mouth every 8 (eight) hours. 270 capsule 1  . ramipril (ALTACE) 10 MG capsule TAKE 1 CAPSULE DAILY 30 capsule 0  . rOPINIRole (REQUIP) 2 MG tablet  Take 1 tablet (2 mg total) by mouth 2 (two) times daily. 180 tablet 2  . spironolactone (ALDACTONE) 25 MG tablet Take 1 tablet (25 mg total) by mouth daily. 90 tablet 1  . vitamin B-12 (CYANOCOBALAMIN) 500 MCG tablet Take 1 tablet (500 mcg total) by mouth daily. 90 tablet 3  . HYDROcodone-acetaminophen (NORCO) 7.5-325 MG tablet Take 1 tablet by mouth every 6 (six) hours as needed for severe pain. Must last 30 days 120 tablet 0   No current facility-administered medications on file prior to visit.    There are no Patient Instructions on file for this visit. No follow-ups on file.   Georgiana Spinner, NP

## 2020-07-17 ENCOUNTER — Telehealth: Payer: Self-pay | Admitting: *Deleted

## 2020-07-17 NOTE — Telephone Encounter (Signed)
(  07/17/2020) Left message for pt to notify them that it is time to schedule annual low dose lung cancer screening CT scan. Instructed patient to call back to verify information prior to the scan being scheduled [facility]     

## 2020-07-26 NOTE — Progress Notes (Signed)
PROVIDER NOTE: Information contained herein reflects review and annotations entered in association with encounter. Interpretation of such information and data should be left to medically-trained personnel. Information provided to patient can be located elsewhere in the medical record under "Patient Instructions". Document created using STT-dictation technology, any transcriptional errors that may result from process are unintentional.    Patient: Suzanne Avila  Service Category: E/M  Provider: Gaspar Cola, MD  DOB: 12/30/1945  DOS: 07/27/2020  Specialty: Interventional Pain Management  MRN: [id number]  Setting: Ambulatory outpatient  PCP: Mar Daring, PA-C  Type: Established Patient    Referring Provider: Florian Buff*  Location: Office  Delivery: Face-to-face     HPI  [name], a 74 y.o. year old female, is here today because of her Chronic pain syndrome [G89.4]. [name] primary complain today is Back Pain Last encounter: My last encounter with her was on 05/26/2020. Pertinent problems: 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 Pain Assessment: Severity of Chronic pain is reported as a 6 /10. Location: Back Mid, Lower/Denies. Onset: More than a month ago. Quality: Aching, Burning. Timing: Constant. Modifying factor(s): sitting down, meds and rest. Vitals:  height is $RemoveB'4\' 11"'uTfAmNGa$  (1.499 m) and weight is 100 lb (45.4 kg). Her temperature is 98.4 F (36.9 C). Her blood pressure is 108/71 and her pulse is 92. Her oxygen saturation is 97%.   Reason for encounter: medication management.  The patient indicates doing well with the current medication regimen. No adverse reactions or side effects reported to the medications.  Today we talked about her Lyrica and she indicated that at one time she was actually taking the Lyrica 4 times a day instead of 3 times a day.  However, she is still driving and she also describes occasionally dozing off.  For this reason, I have recommended that we stay on the current dose and not make any increases.  In fact, today we will be transferring the Lyrica to her PCP.  The patient indicates beginning to experience more pain in the left lower back.  Provocative physical exam today shows exact reproduction of the patient's pain with lumbar facet and left SI joint provocative maneuvers.  The patient has requested to be schedule to repeat the left lumbar facet and SI joint block that she had done last time.  I thought about the possibility of doing radiofrequency ablation, but then I read my notes were I wrote down that she is a poor candidate due to her anatomy and hardware from prior surgeries.  Therefore, we will limited to palliative interventions.  RTCB: 11/04/2020  Pharmacotherapy Assessment   Analgesic: Hydrocodone/APAP 7.5/325, 1 tab PO q 6 hrs (30 mg/day of hydrocodone) MME/day:30 mg/day.   Monitoring: [place] PMP: PDMP reviewed during this encounter.  Pharmacotherapy: No side-effects or adverse reactions  reported. Compliance: No problems identified. Effectiveness: Clinically acceptable.  Chauncey Fischer, RN  07/27/2020 10:52 AM  Sign when Signing Visit Nursing Pain Medication Assessment:  Safety precautions to be maintained throughout the outpatient stay will include: orient to surroundings, keep bed in low position, maintain call bell within reach at all times, provide assistance with transfer out of bed and ambulation.  Medication Inspection Compliance: Pill count conducted under aseptic conditions, in front of the patient. Neither the pills nor the bottle was removed from the patient's sight at any time. Once count was completed pills were immediately returned to the patient in their original bottle.  Medication: Hydrocodone/APAP Pill/Patch Count: 37 of 120 pills remain Pill/Patch Appearance: Markings consistent with prescribed medication Bottle Appearance: Standard pharmacy container. Clearly labeled. Filled Date: 59 / 14 / 21 Last Medication intake:  TodaySafety precautions to be maintained throughout the outpatient stay will include: orient to surroundings, keep bed in low position, maintain call bell within reach at all times, provide assistance with transfer out of bed and ambulation.     UDS:  Summary  Date Value Ref Range Status  05/06/2020 Note  Final    Comment:    ==================================================================== [employer] Select 13 (MW) ==================================================================== Specimen Alert Note: Urinary creatinine is low; ability to detect some drugs may be compromised. Interpret results with caution. (Creatinine) ==================================================================== Test                             Result       Flag       Units  Drug Present and Declared for Prescription Verification   Hydrocodone                    3460         EXPECTED   ng/mg creat   Hydromorphone                  1380         EXPECTED    ng/mg creat   Dihydrocodeine                 580          EXPECTED   ng/mg creat   Norhydrocodone                 2430         EXPECTED   ng/mg creat    Sources of hydrocodone include scheduled prescription medications.    Hydromorphone, dihydrocodeine and norhydrocodone are expected    metabolites of hydrocodone. Hydromorphone and dihydrocodeine are    also available as scheduled prescription medications.  ==================================================================== Test                      Result    Flag   Units      Ref Range   Creatinine              10        LL     mg/dL      >=20 ==================================================================== Declared Medications:  The flagging and interpretation on this report are based on the  following declared medications.  Unexpected results may arise from  inaccuracies in the declared medications.   **Note: The testing scope of this panel includes these medications:   Hydrocodone   **Note: The testing scope of this panel does not include the  following reported medications:   Acetaminophen  Albuterol  Aspirin  Atorvastatin  Cyanocobalamin  Diltiazem  Dorzolamide  Fluticasone (Advair)  Furosemide (Lasix)  Latanoprost  Magnesium  Meloxicam  Multivitamin  Omeprazole  Pregabalin (Lyrica)  Ramipril  Ropinirole  Salmeterol (Advair)  Spironolactone ==================================================================== For clinical consultation, please call [phone]. ====================================================================      ROS  Constitutional: Denies any fever or chills Gastrointestinal: No reported hemesis, hematochezia, vomiting, or acute GI distress Musculoskeletal: Denies any acute onset joint swelling, redness, loss of ROM, or weakness Neurological: No reported episodes of acute onset apraxia, aphasia, dysarthria, agnosia, amnesia, paralysis, loss of coordination, or loss of  consciousness  Medication Review  Fluticasone-Salmeterol, HYDROcodone-acetaminophen, Multi-Vitamin, Multiple Vitamins-Minerals, albuterol, aspirin, atorvastatin, diltiazem, dorzolamide, furosemide, latanoprost, magnesium gluconate, meloxicam, omeprazole, pregabalin, rOPINIRole, ramipril, spironolactone, and vitamin B-12  History Review  Allergy: Ms. Sulton is allergic to codeine, cyclobenzaprine, and penicillins. Drug: [name]  reports no history of drug use. Alcohol:  reports current alcohol use of about 35.0 standard drinks of alcohol per week. Tobacco:  reports that she has been smoking cigarettes. She has a 50.00 pack-year smoking history. She has never used smokeless tobacco. Social: Ms. Amedee  reports that she has been smoking cigarettes. She has a 50.00 pack-year smoking history. She has never used smokeless tobacco. She reports current alcohol use of about 35.0 standard drinks of alcohol per week. She reports that she does not use drugs. Medical:  has a past medical history of Glaucoma, Hyperlipidemia, and Hypertension. Surgical: Ms. Reimann  has a past surgical history that includes Back surgery; Eye surgery; and Carpal tunnel release (Left). Family: family history includes Cancer in her father and mother.  Laboratory Chemistry Profile   Renal Lab Results  Component Value Date   BUN 12 02/03/2020   CREATININE 1.04 (H) 02/03/2020   BCR 12 02/03/2020   GFRAA 61 02/03/2020   GFRNONAA 53 (L) 02/03/2020     Hepatic Lab Results  Component Value Date   AST 15 02/03/2020   ALT 10 02/03/2020   ALBUMIN 4.2 02/03/2020   ALKPHOS 96 02/03/2020     Electrolytes Lab Results  Component Value Date   NA 137 02/03/2020   K 4.4 02/03/2020   CL 100 02/03/2020   CALCIUM 10.1 02/03/2020   MG 1.7 02/14/2017     Bone Lab Results  Component Value Date   VD25OH 45.9 02/03/2020     Inflammation (CRP: Acute Phase) (ESR: Chronic Phase) Lab Results  Component Value Date    CRP <0.8 02/14/2017   ESRSEDRATE 20 02/14/2017       Note: Above Lab results reviewed.  Recent Imaging Review  VAS US RENAL ARTERY DUPLEX ABDOMINAL VISCERAL  Performing Technologist: Almira Coaster RVS    Examination Guidelines: A complete evaluation includes B-mode imaging, spectral Doppler, color Doppler, and power Doppler as needed of all accessible portions of each vessel. Bilateral testing is considered an integral part of a complete examination. Limited examinations for reoccurring indications may be performed as noted.    Duplex Findings: +------------+--------+--------+------+--------+ Mesenteric  PSV cm/sEDV cm/sPlaqueComments +------------+--------+--------+------+--------+ Aorta Prox     58      20                  +------------+--------+--------+------+--------+ Aorta Mid      66      19                  +------------+--------+--------+------+--------+ Aorta Distal   58  9                   +------------+--------+--------+------+--------+          +------------------+--------+--------+-------+ Right Renal ArteryPSV cm/sEDV cm/sComment +------------------+--------+--------+-------+ Origin               77      24           +------------------+--------+--------+-------+ Proximal             97      32           +------------------+--------+--------+-------+ Mid                  93      32           +------------------+--------+--------+-------+ Distal               48      19           +------------------+--------+--------+-------+  +-----------------+--------+--------+-------+ Left Renal ArteryPSV cm/sEDV cm/sComment +-----------------+--------+--------+-------+ Origin              84      15           +-----------------+--------+--------+-------+ Proximal            69      14           +-----------------+--------+--------+-------+ Mid                 82      23            +-----------------+--------+--------+-------+ Distal              75      16           +-----------------+--------+--------+-------+  +------------+--------+--------+--+-----------+--------+--------+---+ Right KidneyPSV cm/sEDV cm/sRILeft KidneyPSV cm/sEDV cm/sRI  +------------+--------+--------+--+-----------+--------+--------+---+ Upper Pole                    Upper Pole                     +------------+--------+--------+--+-----------+--------+--------+---+ Mid                           Mid                            +------------+--------+--------+--+-----------+--------+--------+---+ Lower Pole                    Lower Pole                     +------------+--------+--------+--+-----------+--------+--------+---+ Hilar                         Hilar                          +------------+--------+--------+--+-----------+--------+--------+---+  +------------------+----+------------------+-----+ Right Kidney          Left Kidney             +------------------+----+------------------+-----+ RAR                   RAR                     +------------------+----+------------------+-----+ RAR (manual)      1.47RAR (manual)      1.27  +------------------+----+------------------+-----+  Cortex                Cortex                  +------------------+----+------------------+-----+ Cortex thickness      Corex thickness         +------------------+----+------------------+-----+ Kidney length (cm)6.66Kidney length (cm)11.00 +------------------+----+------------------+-----+    Summary: Largest Aortic Diameter: 2.2 cm   Renal:   Right: Abnormal size for the right kidney. No evidence of right        renal artery stenosis. RRV flow present. Left:  Normal size of left kidney. No evidence of left renal artery        stenosis. LRV flow present.   *See table(s) above for measurements and observations.    Diagnosing physician: Leotis Pain MD   Electronically signed by Leotis Pain MD on 07/07/2020 at 4:55:20 PM.       Final   Note: Reviewed        Physical Exam  General appearance: Well nourished, well developed, and well hydrated. In no apparent acute distress Mental status: Alert, oriented x 3 (person, place, & time)       Respiratory: No evidence of acute respiratory distress Eyes: PERLA Vitals: BP 108/71   Pulse 92   Temp 98.4 F (36.9 C)   Ht $R'4\' 11"'Hp$  (1.499 m)   Wt 100 lb (45.4 kg)   SpO2 97%   BMI 20.20 kg/m  BMI: Estimated body mass index is 20.2 kg/m as calculated from the following:   Height as of this encounter: $RemoveBeforeD'4\' 11"'UPfForIwVOZZDv$  (1.499 m).   Weight as of this encounter: 100 lb (45.4 kg). Ideal: Female patients must weigh at least 45.5 kg to calculate ideal body weight  Assessment   Status Diagnosis  Controlled Controlled Controlled 1. Chronic pain syndrome   2. Pharmacologic therapy   3. Chronic lower extremity pain (Primary Area of Pain) (Bilateral) (L>R)   4. Chronic low back pain (Secondary area of Pain) (Bilateral) (L>R)   5. Chronic upper extremity pain (Third area of Pain) (Bilateral) (L>R)   6. Fibromyalgia   7. Lumbar facet syndrome (Bilateral) (L>R)   8. Chronic sacroiliac joint pain (Left)      Updated Problems: No problems updated.  Plan of Care  Problem-specific:  No problem-specific Assessment & Plan notes found for this encounter.  [name] has a current medication list which includes the following long-term medication(s): albuterol, atorvastatin, diltiazem, fluticasone-salmeterol, furosemide, meloxicam, omeprazole, ramipril, ropinirole, spironolactone, [START ON 08/06/2020] hydrocodone-acetaminophen, [START ON 09/05/2020] hydrocodone-acetaminophen, [START ON 10/05/2020] hydrocodone-acetaminophen, and [START ON 08/06/2020] pregabalin.  Pharmacotherapy (Medications Ordered): Meds ordered this encounter  Medications  .  HYDROcodone-acetaminophen (NORCO) 7.5-325 MG tablet    Sig: Take 1 tablet by mouth every 6 (six) hours as needed for severe pain. Must last 30 days    Dispense:  120 tablet    Refill:  0    Chronic Pain: STOP Act (Not applicable) Fill 1 day early if closed on refill date. Avoid benzodiazepines within 8 hours of opioids  . pregabalin (LYRICA) 150 MG capsule    Sig: Take 1 capsule (150 mg total) by mouth every 8 (eight) hours.    Dispense:  270 capsule    Refill:  1    Fill one day early if pharmacy is closed on scheduled refill date. May substitute for generic if available.  Marland Kitchen HYDROcodone-acetaminophen (NORCO) 7.5-325 MG tablet    Sig: Take 1 tablet by mouth every 6 (six)  hours as needed for severe pain. Must last 30 days    Dispense:  120 tablet    Refill:  0    Chronic Pain: STOP Act (Not applicable) Fill 1 day early if closed on refill date. Avoid benzodiazepines within 8 hours of opioids  . HYDROcodone-acetaminophen (NORCO) 7.5-325 MG tablet    Sig: Take 1 tablet by mouth every 6 (six) hours as needed for severe pain. Must last 30 days    Dispense:  120 tablet    Refill:  0    Chronic Pain: STOP Act (Not applicable) Fill 1 day early if closed on refill date. Avoid benzodiazepines within 8 hours of opioids   Orders:  Orders Placed This Encounter  Procedures  . LUMBAR FACET(MEDIAL BRANCH NERVE BLOCK) MBNB    Standing Status:   Future    Standing Expiration Date:   08/27/2020    Scheduling Instructions:     Procedure: Lumbar facet block (AKA.: Lumbosacral medial branch nerve block)     Side: Left-sided     Level: L3-4, L4-5, & L5-S1 Facets (L2, L3, L4, L5, & S1 Medial Branch Nerves)     Sedation: Patient's choice.     Timeframe: ASAA    Order Specific Question:   Where will this procedure be performed?    Answer:   [facility]  . SACROILIAC JOINT INJECTION    Standing Status:   Future    Standing Expiration Date:   08/27/2020    Scheduling Instructions:     Side:  Left-sided     Sedation: Patient's choice.     Timeframe: ASAP    Order Specific Question:   Where will this procedure be performed?    Answer:   [facility]   Follow-up plan:   Return for Procedure (w/ sedation): (L) SI BLK + (L) L-FCT BLK.      Considering:   Diagnostic bilateral IA hip joint injection  Possible bilateral hip joint RFA  Possible bilateral lumbar facet RFA  Possible left SI joint RFA  Diagnostic bilateral L4-L5 TFESI  Diagnostic caudal ESI  Diagnostic caudal epidurogram  Possible RACZ epidural lysis of adhesions  Diagnostic left L2-3 LESI  Possible bilateral Lumbar SCS trial implant    Palliative PRN treatment(s):   Diagnostic right lumbar facet block #2  Diagnostic left lumbar facet block #3  Diagnostic left SI joint block #3     Recent Visits Date Type Provider Dept  06/09/20 Telemedicine Milinda Pointer, MD [facility]  05/26/20 Procedure visit Milinda Pointer, MD [facility]  05/06/20 Office Visit Milinda Pointer, MD [facility]  Showing recent visits within past 90 days and meeting all other requirements Today's Visits Date Type Provider Dept  07/27/20 Office Visit Milinda Pointer, MD [facility]  Showing today's visits and meeting all other requirements Future Appointments Date Type Provider Dept  08/25/20 Appointment Milinda Pointer, MD [facility]  Showing future appointments within next 90 days and meeting all other requirements  I discussed the assessment and treatment plan with the patient. The patient was provided an opportunity to ask questions and all were answered. The patient agreed with the plan and demonstrated an understanding of the instructions.  Patient advised to call back or seek an in-person evaluation if the symptoms or condition worsens.  Duration of encounter: 31 minutes.  Note by: Gaspar Cola, MD Date: 07/27/2020; Time: 12:13 PM

## 2020-07-27 ENCOUNTER — Encounter: Payer: Self-pay | Admitting: Pain Medicine

## 2020-07-27 ENCOUNTER — Ambulatory Visit: Payer: Medicare Other | Attending: Pain Medicine | Admitting: Pain Medicine

## 2020-07-27 ENCOUNTER — Other Ambulatory Visit: Payer: Self-pay

## 2020-07-27 VITALS — BP 108/71 | HR 92 | Temp 98.4°F | Ht 59.0 in | Wt 100.0 lb

## 2020-07-27 DIAGNOSIS — M797 Fibromyalgia: Secondary | ICD-10-CM | POA: Diagnosis present

## 2020-07-27 DIAGNOSIS — G894 Chronic pain syndrome: Secondary | ICD-10-CM | POA: Insufficient documentation

## 2020-07-27 DIAGNOSIS — M79605 Pain in left leg: Secondary | ICD-10-CM

## 2020-07-27 DIAGNOSIS — M79602 Pain in left arm: Secondary | ICD-10-CM | POA: Insufficient documentation

## 2020-07-27 DIAGNOSIS — G8929 Other chronic pain: Secondary | ICD-10-CM | POA: Insufficient documentation

## 2020-07-27 DIAGNOSIS — Z79899 Other long term (current) drug therapy: Secondary | ICD-10-CM | POA: Diagnosis not present

## 2020-07-27 DIAGNOSIS — M47816 Spondylosis without myelopathy or radiculopathy, lumbar region: Secondary | ICD-10-CM | POA: Insufficient documentation

## 2020-07-27 DIAGNOSIS — M5442 Lumbago with sciatica, left side: Secondary | ICD-10-CM

## 2020-07-27 DIAGNOSIS — M79601 Pain in right arm: Secondary | ICD-10-CM

## 2020-07-27 DIAGNOSIS — M79604 Pain in right leg: Secondary | ICD-10-CM | POA: Insufficient documentation

## 2020-07-27 DIAGNOSIS — M5441 Lumbago with sciatica, right side: Secondary | ICD-10-CM

## 2020-07-27 DIAGNOSIS — M533 Sacrococcygeal disorders, not elsewhere classified: Secondary | ICD-10-CM

## 2020-07-27 MED ORDER — HYDROCODONE-ACETAMINOPHEN 7.5-325 MG PO TABS: 1.0000 | ORAL_TABLET | Freq: Four times a day (QID) | ORAL | 0 refills | Status: DC | PRN

## 2020-07-27 MED ORDER — PREGABALIN 150 MG PO CAPS: 150.0000 mg | ORAL_CAPSULE | Freq: Three times a day (TID) | ORAL | 1 refills | Status: DC

## 2020-07-27 NOTE — Patient Instructions (Signed)
____________________________________________________________________________________________  Preparing for Procedure with Sedation  Procedure appointments are limited to planned procedures: . No Prescription Refills. . No disability issues will be discussed. . No medication changes will be discussed.  Instructions: . Oral Intake: Do not eat or drink anything for at least 8 hours prior to your procedure. (Exception: Blood Pressure Medication. See below.) . Transportation: Unless otherwise stated by your physician, you may drive yourself after the procedure. . Blood Pressure Medicine: Do not forget to take your blood pressure medicine with a sip of water the morning of the procedure. If your Diastolic (lower reading)is above 100 mmHg, elective cases will be cancelled/rescheduled. . Blood thinners: These will need to be stopped for procedures. Notify our staff if you are taking any blood thinners. Depending on which one you take, there will be specific instructions on how and when to stop it. . Diabetics on insulin: Notify the staff so that you can be scheduled 1st case in the morning. If your diabetes requires high dose insulin, take only  of your normal insulin dose the morning of the procedure and notify the staff that you have done so. . Preventing infections: Shower with an antibacterial soap the morning of your procedure. . Build-up your immune system: Take 1000 mg of Vitamin C with every meal (3 times a day) the day prior to your procedure. . Antibiotics: Inform the staff if you have a condition or reason that requires you to take antibiotics before dental procedures. . Pregnancy: If you are pregnant, call and cancel the procedure. . Sickness: If you have a cold, fever, or any active infections, call and cancel the procedure. . Arrival: You must be in the facility at least 30 minutes prior to your scheduled procedure. . Children: Do not bring children with you. . Dress appropriately:  Bring dark clothing that you would not mind if they get stained. . Valuables: Do not bring any jewelry or valuables.  Reasons to call and reschedule or cancel your procedure: (Following these recommendations will minimize the risk of a serious complication.) . Surgeries: Avoid having procedures within 2 weeks of any surgery. (Avoid for 2 weeks before or after any surgery). . Flu Shots: Avoid having procedures within 2 weeks of a flu shots or . (Avoid for 2 weeks before or after immunizations). . Barium: Avoid having a procedure within 7-10 days after having had a radiological study involving the use of radiological contrast. (Myelograms, Barium swallow or enema study). . Heart attacks: Avoid any elective procedures or surgeries for the initial 6 months after a "Myocardial Infarction" (Heart Attack). . Blood thinners: It is imperative that you stop these medications before procedures. Let us know if you if you take any blood thinner.  . Infection: Avoid procedures during or within two weeks of an infection (including chest colds or gastrointestinal problems). Symptoms associated with infections include: Localized redness, fever, chills, night sweats or profuse sweating, burning sensation when voiding, cough, congestion, stuffiness, runny nose, sore throat, diarrhea, nausea, vomiting, cold or Flu symptoms, recent or current infections. It is specially important if the infection is over the area that we intend to treat. . Heart and lung problems: Symptoms that may suggest an active cardiopulmonary problem include: cough, chest pain, breathing difficulties or shortness of breath, dizziness, ankle swelling, uncontrolled high or unusually low blood pressure, and/or palpitations. If you are experiencing any of these symptoms, cancel your procedure and contact your primary care physician for an evaluation.  Remember:  Regular Business hours are:    Monday to Thursday 8:00 AM to 4:00 PM  Provider's  Schedule: Milinda Pointer, MD:  Procedure days: Tuesday and Thursday 7:30 AM to 4:00 PM  Gillis Santa, MD:  Procedure days: Monday and Wednesday 7:30 AM to 4:00 PM ____________________________________________________________________________________________   ____________________________________________________________________________________________  Medication Rules  Purpose: To inform patients, and their family members, of our rules and regulations.  Applies to: All patients receiving prescriptions (written or electronic).  Pharmacy of record: Pharmacy where electronic prescriptions will be sent. If written prescriptions are taken to a different pharmacy, please inform the nursing staff. The pharmacy listed in the electronic medical record should be the one where you would like electronic prescriptions to be sent.  Electronic prescriptions: In compliance with the [address] [place] [address] (STOP) Act of 2017 (Session Lanny Cramp [phone]), effective October 24, 2018, all controlled substances must be electronically prescribed. Calling prescriptions to the pharmacy will cease to exist.  Prescription refills: Only during scheduled appointments. Applies to all prescriptions.  NOTE: The following applies primarily to controlled substances (Opioid* Pain Medications).   Type of encounter (visit): For patients receiving controlled substances, face-to-face visits are required. (Not an option or up to the patient.)  Patient's responsibilities: 1. Pain Pills: Bring all pain pills to every appointment (except for procedure appointments). 2. Pill Bottles: Bring pills in original pharmacy bottle. Always bring the newest bottle. Bring bottle, even if empty. 3. Medication refills: You are responsible for knowing and keeping track of what medications you take and those you need refilled. The day before your appointment: write a list of all prescriptions that need to be  refilled. The day of the appointment: give the list to the admitting nurse. Prescriptions will be written only during appointments. No prescriptions will be written on procedure days. If you forget a medication: it will not be "Called in", "Faxed", or "electronically sent". You will need to get another appointment to get these prescribed. No early refills. Do not call asking to have your prescription filled early. 4. Prescription Accuracy: You are responsible for carefully inspecting your prescriptions before leaving our office. Have the discharge nurse carefully go over each prescription with you, before taking them home. Make sure that your name is accurately spelled, that your address is correct. Check the name and dose of your medication to make sure it is accurate. Check the number of pills, and the written instructions to make sure they are clear and accurate. Make sure that you are given enough medication to last until your next medication refill appointment. 5. Taking Medication: Take medication as prescribed. When it comes to controlled substances, taking less pills or less frequently than prescribed is permitted and encouraged. Never take more pills than instructed. Never take medication more frequently than prescribed.  6. Inform other Doctors: Always inform, all of your healthcare providers, of all the medications you take. 7. Pain Medication from other Providers: You are not allowed to accept any additional pain medication from any other Doctor or Healthcare provider. There are two exceptions to this rule. (see below) In the event that you require additional pain medication, you are responsible for notifying us, as stated below. 8. Medication Agreement: You are responsible for carefully reading and following our Medication Agreement. This must be signed before receiving any prescriptions from our practice. Safely store a copy of your signed Agreement. Violations to the Agreement will result in  no further prescriptions. (Additional copies of our Medication Agreement are available upon request.) 9. Laws, Rules, & Regulations: All  patients are expected to follow all Federal and [employer], [employer], Rules, [employer]. Ignorance of the Laws does not constitute a valid excuse.  10. Illegal drugs and Controlled Substances: The use of illegal substances (including, but not limited to marijuana and its derivatives) and/or the illegal use of any controlled substances is strictly prohibited. Violation of this rule may result in the immediate and permanent discontinuation of any and all prescriptions being written by our practice. The use of any illegal substances is prohibited. 11. Adopted CDC guidelines & recommendations: Target dosing levels will be at or below 60 MME/day. Use of benzodiazepines** is not recommended.  Exceptions: There are only two exceptions to the rule of not receiving pain medications from other Healthcare Providers. 1. Exception #1 (Emergencies): In the event of an emergency (i.e.: accident requiring emergency care), you are allowed to receive additional pain medication. However, you are responsible for: As soon as you are able, call our office [phone], at any time of the day or night, and leave a message stating your name, the date and nature of the emergency, and the name and dose of the medication prescribed. In the event that your call is answered by a member of our staff, make sure to document and save the date, time, and the name of the person that took your information.  2. Exception #2 (Planned Surgery): In the event that you are scheduled by another doctor or dentist to have any type of surgery or procedure, you are allowed (for a period no longer than 30 days), to receive additional pain medication, for the acute post-op pain. However, in this case, you are responsible for picking up a copy of our "Post-op Pain Management for Surgeons" handout, and giving it to your  surgeon or dentist. This document is available at our office, and does not require an appointment to obtain it. Simply go to our office during business hours (Monday-Thursday from 8:00 AM to 4:00 PM) (Friday 8:00 AM to 12:00 Noon) or if you have a scheduled appointment with [place], prior to your surgery, and ask for it by name. In addition, you are responsible for: calling our office [phone], at any time of the day or night, and leaving a message stating your name, name of your surgeon, type of surgery, and date of procedure or surgery. Failure to comply with your responsibilities may result in termination of therapy involving the controlled substances.  *Opioid medications include: morphine, codeine, oxycodone, oxymorphone, hydrocodone, hydromorphone, meperidine, tramadol, tapentadol, buprenorphine, fentanyl, methadone. **Benzodiazepine medications include: diazepam (Valium), alprazolam (Xanax), clonazepam (Klonopine), lorazepam (Ativan), clorazepate (Tranxene), chlordiazepoxide (Librium), estazolam (Prosom), oxazepam (Serax), temazepam (Restoril), triazolam (Halcion) (Last updated: 06/30/2020) ____________________________________________________________________________________________   ____________________________________________________________________________________________  Medication Recommendations and Reminders  Applies to: All patients receiving prescriptions (written and/or electronic).  Medication Rules & Regulations: These rules and regulations exist for your safety and that of others. They are not flexible and neither are we. Dismissing or ignoring them will be considered "non-compliance" with medication therapy, resulting in complete and irreversible termination of such therapy. (See document titled "Medication Rules" for more details.) In all conscience, because of safety reasons, we cannot continue providing a therapy where the patient does not follow instructions.  Pharmacy of  record:   Definition: This is the pharmacy where your electronic prescriptions will be sent.   We do not endorse any particular pharmacy, however, we have experienced problems with [facility] not securing enough medication supply for the community.  We do not restrict you  in your choice of pharmacy. However, once we write for your prescriptions, we will NOT be re-sending more prescriptions to fix restricted supply problems created by your pharmacy, or your insurance.   The pharmacy listed in the electronic medical record should be the one where you want electronic prescriptions to be sent.  If you choose to change pharmacy, simply notify our nursing staff.  Recommendations:  Keep all of your pain medications in a safe place, under lock and key, even if you live alone. We will NOT replace lost, stolen, or damaged medication.  After you fill your prescription, take 1 week's worth of pills and put them away in a safe place. You should keep a separate, properly labeled bottle for this purpose. The remainder should be kept in the original bottle. Use this as your primary supply, until it runs out. Once it's gone, then you know that you have 1 week's worth of medicine, and it is time to come in for a prescription refill. If you do this correctly, it is unlikely that you will ever run out of medicine.  To make sure that the above recommendation works, it is very important that you make sure your medication refill appointments are scheduled at least 1 week before you run out of medicine. To do this in an effective manner, make sure that you do not leave the office without scheduling your next medication management appointment. Always ask the nursing staff to show you in your prescription , when your medication will be running out. Then arrange for the receptionist to get you a return appointment, at least 7 days before you run out of medicine. Do not wait until you have 1 or 2 pills left, to come in. This is  very poor planning and does not take into consideration that we may need to cancel appointments due to bad weather, sickness, or emergencies affecting our staff.  DO NOT ACCEPT A "Partial Fill": If for any reason your pharmacy does not have enough pills/tablets to completely fill or refill your prescription, do not allow for a "partial fill". The law allows the pharmacy to complete that prescription within 72 hours, without requiring a new prescription. If they do not fill the rest of your prescription within those 72 hours, you will need a separate prescription to fill the remaining amount, which we will NOT provide. If the reason for the partial fill is your insurance, you will need to talk to the pharmacist about payment alternatives for the remaining tablets, but again, DO NOT ACCEPT A PARTIAL FILL, unless you can trust your pharmacist to obtain the remainder of the pills within 72 hours.  Prescription refills and/or changes in medication(s):   Prescription refills, and/or changes in dose or medication, will be conducted only during scheduled medication management appointments. (Applies to both, written and electronic prescriptions.)  No refills on procedure days. No medication will be changed or started on procedure days. No changes, adjustments, and/or refills will be conducted on a procedure day. Doing so will interfere with the diagnostic portion of the procedure.  No phone refills. No medications will be "called into the pharmacy".  No Fax refills.  No weekend refills.  No Holliday refills.  No after hours refills.  Remember:  Business hours are:  Monday to Thursday 8:00 AM to 4:00 PM Provider's Schedule: Milinda Pointer, MD - Appointments are:  Medication management: Monday and Wednesday 8:00 AM to 4:00 PM Procedure day: Tuesday and Thursday 7:30 AM to 4:00 PM Bilal  Cherylann Ratel, MD - Appointments are:  Medication management: Tuesday and Thursday 8:00 AM to 4:00 PM Procedure day:  Monday and Wednesday 7:30 AM to 4:00 PM (Last update: 05/13/2020) ____________________________________________________________________________________________   ____________________________________________________________________________________________  CBD (cannabidiol) WARNING  Applicable to: All individuals currently taking or considering taking CBD (cannabidiol) and, more important, all patients taking opioid analgesic controlled substances (pain medication). (Example: oxycodone; oxymorphone; hydrocodone; hydromorphone; morphine; methadone; tramadol; tapentadol; fentanyl; buprenorphine; butorphanol; dextromethorphan; meperidine; codeine; etc.)  Legal status: CBD remains a Schedule I drug prohibited for any use. CBD is illegal with one exception. In the [place], CBD has a limited Education officer, environmental (FDA) approval for the treatment of two specific types of epilepsy disorders. Only one CBD product has been approved by the FDA for this purpose: "Epidiolex". FDA is aware that some companies are marketing products containing cannabis and cannabis-derived compounds in ways that violate the [employer], Drug and Cosmetic Act [facility] Act) and that may put the health and safety of consumers at risk. The FDA, a Federal agency, has not enforced the CBD status since 2018.   Legality: Some manufacturers ship CBD products nationally, which is illegal. Often such products are sold online and are therefore available throughout the country. CBD is openly sold in head shops and health food stores in some states where such sales have not been explicitly legalized. Selling unapproved products with unsubstantiated therapeutic claims is not only a violation of the law, but also can put patients at risk, as these products have not been proven to be safe or effective. Federal illegality makes it difficult to conduct research on CBD.  Reference: "FDA Regulation of Cannabis and Cannabis-Derived Products,  Including Cannabidiol (CBD)" - [employer]  Warning: CBD is not FDA approved and has not undergo the same manufacturing controls as prescription drugs.  This means that the purity and safety of available CBD may be questionable. Most of the time, despite manufacturer's claims, it is contaminated with THC (delta-9-tetrahydrocannabinol - the chemical in marijuana responsible for the "HIGH").  When this is the case, the [facility] contaminant will trigger a positive urine drug screen (UDS) test for Marijuana (carboxy-THC). Because a positive UDS for any illicit substance is a violation of our medication agreement, your opioid analgesics (pain medicine) may be permanently discontinued.  MORE ABOUT CBD  General Information: CBD  is a derivative of the Marijuana (cannabis sativa) plant discovered in 42. It is one of the 113 identified substances found in Marijuana. It accounts for up to 40% of the plant's extract. As of 2018, preliminary clinical studies on CBD included research for the treatment of anxiety, movement disorders, and pain. CBD is available and consumed in multiple forms, including inhalation of smoke or vapor, as an aerosol spray, and by mouth. It may be supplied as an oil containing CBD, capsules, dried cannabis, or as a liquid solution. CBD is thought not to be as psychoactive as THC (delta-9-tetrahydrocannabinol - the chemical in marijuana responsible for the "HIGH"). Studies suggest that CBD may interact with different biological target receptors in the body, including cannabinoid and other neurotransmitter receptors. As of 2018 the mechanism of action for its biological effects has not been determined.  Side-effects  Adverse reactions: Dry mouth, diarrhea, decreased appetite, fatigue, drowsiness, malaise, weakness, sleep disturbances, and others.  Drug interactions: CBC may  interact with other medications such as blood-thinners. (Last update: 05/30/2020) ____________________________________________________________________________________________

## 2020-07-27 NOTE — Progress Notes (Signed)
Nursing Pain Medication Assessment:  Safety precautions to be maintained throughout the outpatient stay will include: orient to surroundings, keep bed in low position, maintain call bell within reach at all times, provide assistance with transfer out of bed and ambulation.  Medication Inspection Compliance: Pill count conducted under aseptic conditions, in front of the patient. Neither the pills nor the bottle was removed from the patient's sight at any time. Once count was completed pills were immediately returned to the patient in their original bottle.  Medication: Hydrocodone/APAP Pill/Patch Count: 37 of [id number] pills remain Pill/Patch Appearance: Markings consistent with prescribed medication Bottle Appearance: Standard pharmacy container. Clearly labeled. Filled Date: 39 / 14 / 21 Last Medication intake:  TodaySafety precautions to be maintained throughout the outpatient stay will include: orient to surroundings, keep bed in low position, maintain call bell within reach at all times, provide assistance with transfer out of bed and ambulation.

## 2020-07-29 ENCOUNTER — Other Ambulatory Visit: Payer: Self-pay | Admitting: *Deleted

## 2020-07-29 DIAGNOSIS — Z122 Encounter for screening for malignant neoplasm of respiratory organs: Secondary | ICD-10-CM

## 2020-07-29 DIAGNOSIS — Z87891 Personal history of nicotine dependence: Secondary | ICD-10-CM

## 2020-07-29 NOTE — Progress Notes (Signed)
Current smoker, 66.5 pack year history

## 2020-08-05 NOTE — Progress Notes (Signed)
Established patient visit   Patient: Suzanne Avila   DOB: 04-14-1946   74 y.o. Female  MRN: [id number] Visit Date: 08/07/2020  Today's healthcare provider: Margaretann LovelessJennifer M Anacaren Kohan, PA-C   Chief Complaint  Patient presents with  . Follow-up   Subjective    HPI  Hypertension, follow-up  BP Readings from Last 3 Encounters:  08/07/20 123/74  07/27/20 108/71  07/03/20 (!) 197/85   Wt Readings from Last 3 Encounters:  08/07/20 95 lb 9.6 oz (43.4 kg)  07/27/20 100 lb (45.4 kg)  07/03/20 100 lb (45.4 kg)     She was last seen for hypertension 3 months ago.  BP at that visit was 118/62. Management since that visit includes continue current medication  She reports excellent compliance with treatment. She is not having side effects.  She is following a Regular diet. Reports that she hardly eats She is not exercising. She does smoke.  Outside blood pressures are not being checked. Symptoms: No chest pain No chest pressure  No palpitations No syncope  No dyspnea No orthopnea  No paroxysmal nocturnal dyspnea No lower extremity edema   --------------------------------------------------------------------------------------------------- Lipid/Cholesterol, Follow-up  Last lipid panel Other pertinent labs  Lab Results  Component Value Date   CHOL 154 02/03/2020   HDL 70 02/03/2020   LDLCALC 64 02/03/2020   TRIG 117 02/03/2020   CHOLHDL 2.6 01/09/2019   Lab Results  Component Value Date   ALT 10 02/03/2020   AST 15 02/03/2020   PLT 301 02/03/2020   TSH 1.110 06/14/2016     She was last seen for this 3 months ago.   She reports excellent compliance with treatment. She is not having side effects.  Symptoms: No chest pain No chest pressure/discomfort  No dyspnea No lower extremity edema  Yes numbness or tingling of extremity No orthopnea  No palpitations No paroxysmal nocturnal dyspnea  No speech difficulty No syncope   Current diet: in general, an "unhealthy"  diet Current exercise: none  The 10-year ASCVD risk score Denman George(Goff DC Montez HagemanJr., et al., 2013) is: 27%  --------------------------------------------------------------------------------------------------- Patient Active Problem List   Diagnosis Date Noted  . Pharmacologic therapy 05/05/2020  . Chronic diarrhea 05/04/2020  . DDD (degenerative disc disease), lumbosacral 11/19/2019  . Therapeutic opioid-induced constipation (OIC) 08/01/2019  . Chronic sacroiliac joint pain (Left) 05/01/2019  . Chronic groin pain (Left) 05/01/2019  . Spondylosis without myelopathy or radiculopathy, lumbosacral region 05/01/2019  . Other specified dorsopathies, sacral and sacrococcygeal region 05/01/2019  . Renal artery stenosis (HCC) 12/27/2017  . Pain in limb 12/22/2017  . Tobacco use disorder 12/22/2017  . Renal atrophy, right 12/22/2017  . Thoracic radiculitis 01/31/2017  . Chronic lower extremity pain (Primary Area of Pain) (Bilateral) (L>R) 04/25/2016  . Chronic upper extremity pain (Third area of Pain) (Bilateral) (L>R) 04/25/2016  . Disturbance of skin sensation 04/20/2016  . Neurogenic pain 04/20/2016  . Chronic hip pain (Bilateral) (L>R) 11/11/2015  . Vitamin D deficiency 11/11/2015  . Edema 09/02/2015  . 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 Encounter for therapeutic drug level monitoring 08/14/2015  . Long term current use of opiate analgesic 08/14/2015  . Long term prescription opiate use 08/14/2015  . Uncomplicated opioid dependence (HCC) 08/14/2015  . Opiate use (30 MME/Day) 08/14/2015  . Chronic low back pain (Secondary area of Pain) (Bilateral) (L>R) 08/14/2015  . Chronic pain syndrome 08/14/2015  . Alcoholic (HCC) 08/14/2015  . B12 deficiency 08/14/2015  . Narrowing of intervertebral disc space  08/14/2015  . Encounter for screening for diabetes mellitus 08/14/2015  . History of metabolic disorder 08/14/2015  . Fungal infection of toenail 08/14/2015  . Osteopenia 08/14/2015    . Compulsive tobacco user syndrome 08/14/2015  . Osteoarthritis of hip (Left) 08/14/2015  . Chronic hip pain (Left) 08/14/2015  . Chronic lumbar radicular pain (L5 dermatome) (Bilateral) (L>R) 08/14/2015  . Lumbar facet syndrome (Bilateral) (L>R) 08/14/2015  . Lumbar spondylosis 08/14/2015  . 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 Lumbar Postlaminectomy syndrome (L4-5) 08/14/2015  . Fibromyalgia 08/14/2015  . Chronic airway obstruction (HCC) 04/21/2015  . Allergic rhinitis 02/10/2010  . Atrial paroxysmal tachycardia (HCC) 07/06/2009  . CAFL (chronic airflow limitation) (HCC) 07/06/2009  . Acid reflux 07/06/2009  . Glaucoma 07/06/2009  . Benign essential HTN 07/06/2009  . Hypercholesterolemia without hypertriglyceridemia 07/06/2009  . Absence of bladder continence 07/06/2009   Past Medical History:  Diagnosis Date  . Glaucoma   . Hyperlipidemia   . Hypertension        Medications: Outpatient Medications Prior to Visit  Medication Sig  . albuterol (PROVENTIL) (2.5 MG/3ML) 0.083% nebulizer solution Take 3 mLs (2.5 mg total) by nebulization every 6 (six) hours as needed for wheezing or shortness of breath.  Marland Kitchen aspirin 325 MG tablet Take 1 tablet (325 mg total) by mouth daily.  Marland Kitchen atorvastatin (LIPITOR) 40 MG tablet Take 1 tablet (40 mg total) by mouth daily.  Marland Kitchen diltiazem (CARDIZEM) 120 MG tablet Take 1 tablet (120 mg total) by mouth daily.  . dorzolamide (TRUSOPT) 2 % ophthalmic solution Place 1 drop into both eyes 3 (three) times daily. (Patient taking differently: Place 1 drop into both eyes 2 (two) times daily. )  . Fluticasone-Salmeterol (ADVAIR DISKUS) 250-50 MCG/DOSE AEPB USE 1 INHALATION TWICE A DAY  . furosemide (LASIX) 20 MG tablet TAKE 1 TABLET DAILY  . HYDROcodone-acetaminophen (NORCO) 7.5-325 MG tablet Take 1 tablet by mouth every 6 (six) hours as needed for severe pain. Must last 30 days  . [START ON 09/05/2020] HYDROcodone-acetaminophen (NORCO)  7.5-325 MG tablet Take 1 tablet by mouth every 6 (six) hours as needed for severe pain. Must last 30 days  . [START ON 10/05/2020] HYDROcodone-acetaminophen (NORCO) 7.5-325 MG tablet Take 1 tablet by mouth every 6 (six) hours as needed for severe pain. Must last 30 days  . latanoprost (XALATAN) 0.005 % ophthalmic solution Place 1 drop into both eyes at bedtime.  . magnesium gluconate (MAGONATE) 500 MG tablet Take 500 mg by mouth daily.  . meloxicam (MOBIC) 15 MG tablet TAKE 1 TABLET DAILY  . Multiple Vitamin (MULTI-VITAMIN) tablet Take 1 tablet by mouth daily.   . Multiple Vitamins-Minerals (VITAMIN D3 COMPLETE PO) Take by mouth.  Marland Kitchen omeprazole (PRILOSEC) 40 MG capsule Take 1 capsule (40 mg total) by mouth daily.  . pregabalin (LYRICA) 150 MG capsule Take 1 capsule (150 mg total) by mouth every 8 (eight) hours.  . ramipril (ALTACE) 10 MG capsule TAKE 1 CAPSULE DAILY  . rOPINIRole (REQUIP) 2 MG tablet Take 1 tablet (2 mg total) by mouth 2 (two) times daily.  Marland Kitchen spironolactone (ALDACTONE) 25 MG tablet Take 1 tablet (25 mg total) by mouth daily.  . vitamin B-12 (CYANOCOBALAMIN) 500 MCG tablet Take 1 tablet (500 mcg total) by mouth daily.   No facility-administered medications prior to visit.    Review of Systems  Constitutional: Negative for appetite change, chills, fatigue and fever.  Respiratory: Negative for chest tightness and shortness of breath.  Cardiovascular: Negative for chest pain and palpitations.  Gastrointestinal: Positive for diarrhea. Negative for abdominal pain, nausea and vomiting.  Neurological: Positive for weakness. Negative for dizziness.    Last CBC Lab Results  Component Value Date   WBC 7.3 02/03/2020   HGB 15.1 02/03/2020   HCT 43.6 02/03/2020   MCV 97 02/03/2020   MCH 33.4 (H) 02/03/2020   RDW 11.7 02/03/2020   PLT 301 02/03/2020   Last metabolic panel Lab Results  Component Value Date   GLUCOSE 96 02/03/2020   NA 137 02/03/2020   K 4.4 02/03/2020   CL  100 02/03/2020   CO2 21 02/03/2020   BUN 12 02/03/2020   CREATININE 1.04 (H) 02/03/2020   GFRNONAA 53 (L) 02/03/2020   GFRAA 61 02/03/2020   CALCIUM 10.1 02/03/2020   PROT 6.5 02/03/2020   ALBUMIN 4.2 02/03/2020   LABGLOB 2.3 02/03/2020   AGRATIO 1.8 02/03/2020   BILITOT 0.3 02/03/2020   ALKPHOS 96 02/03/2020   AST 15 02/03/2020   ALT 10 02/03/2020   ANIONGAP 11 11/11/2015      Objective    BP 123/74 (BP Location: Left Arm, Patient Position: Sitting, Cuff Size: Small)   Pulse 72   Temp 98.6 F (37 C) (Oral)   Resp 16   Wt 95 lb 9.6 oz (43.4 kg)   BMI 19.31 kg/m  BP Readings from Last 3 Encounters:  08/07/20 123/74  07/27/20 108/71  07/03/20 (!) 197/85   Wt Readings from Last 3 Encounters:  08/07/20 95 lb 9.6 oz (43.4 kg)  07/27/20 100 lb (45.4 kg)  07/03/20 100 lb (45.4 kg)      Physical Exam Vitals reviewed.  Constitutional:      General: She is not in acute distress.    Appearance: Normal appearance. She is well-developed and normal weight. She is not ill-appearing or diaphoretic.  Neck:     Thyroid: No thyromegaly.     Vascular: No JVD.     Trachea: No tracheal deviation.  Cardiovascular:     Rate and Rhythm: Normal rate and regular rhythm.     Pulses: Normal pulses.     Heart sounds: Normal heart sounds. No murmur heard.  No friction rub. No gallop.   Pulmonary:     Effort: Pulmonary effort is normal. No respiratory distress.     Breath sounds: Rhonchi (throughout) present. No wheezing or rales.  Musculoskeletal:     Cervical back: Normal range of motion and neck supple.  Lymphadenopathy:     Cervical: No cervical adenopathy.  Neurological:     Mental Status: She is alert.       No results found for any visits on 08/07/20.  Assessment & Plan     1. Need for influenza vaccination Flu vaccine given today without complication. Patient sat upright for 15 minutes to check for adverse reaction before being released. - Flu Vaccine QUAD High  Dose(Fluad)  2. Drug-induced constipation Worsening. Having BM every 3-4 days and reports when she does it is loose and watery. Suspect narcotic induced constipation. Will give Linzess as below to see if this helps BM. F/U in 3 months.  - linaclotide (LINZESS) 145 MCG CAPS capsule; Take 1 capsule (145 mcg total) by mouth daily before breakfast.  Dispense: 90 capsule; Refill: 3   Return in about 3 months (around 11/07/2020).      Delmer Islam, PA-C, have reviewed all documentation for this visit. The documentation on 08/11/20 for the exam, diagnosis, procedures,  and orders are all accurate and complete.   Reine Just  [facility] [phone] (phone) [phone] (fax)  [facility]

## 2020-08-06 ENCOUNTER — Ambulatory Visit: Payer: Medicare Other | Admitting: Physician Assistant

## 2020-08-07 ENCOUNTER — Other Ambulatory Visit: Payer: Self-pay

## 2020-08-07 ENCOUNTER — Ambulatory Visit (INDEPENDENT_AMBULATORY_CARE_PROVIDER_SITE_OTHER): Payer: Medicare Other | Admitting: Physician Assistant

## 2020-08-07 ENCOUNTER — Encounter: Payer: Self-pay | Admitting: Physician Assistant

## 2020-08-07 VITALS — BP 123/74 | HR 72 | Temp 98.6°F | Resp 16 | Wt 95.6 lb

## 2020-08-07 DIAGNOSIS — Z23 Encounter for immunization: Secondary | ICD-10-CM

## 2020-08-07 DIAGNOSIS — K5903 Drug induced constipation: Secondary | ICD-10-CM

## 2020-08-07 MED ORDER — LINACLOTIDE 145 MCG PO CAPS: 145.0000 ug | ORAL_CAPSULE | Freq: Every day | ORAL | 3 refills | Status: DC

## 2020-08-07 NOTE — Patient Instructions (Signed)
Linaclotide oral capsules What is this medicine? LINACLOTIDE (lin a KLOE tide) is used to treat irritable bowel syndrome (IBS) with constipation as the main problem. It may also be used for relief of chronic constipation. This medicine may be used for other purposes; ask your health care provider or pharmacist if you have questions. COMMON BRAND NAME(S): Linzess What should I tell my health care provider before I take this medicine? They need to know if you have any of these conditions:  history of stool (fecal) impaction  now have diarrhea or have diarrhea often  other medical condition  stomach or intestinal disease, including bowel obstruction or abdominal adhesions  an unusual or allergic reaction to linaclotide, other medicines, foods, dyes, or preservatives  pregnant or trying to get pregnant  breast-feeding How should I use this medicine? Take this medicine by mouth with a glass of water. Follow the directions on the prescription label. Do not cut, crush or chew this medicine. Take on an empty stomach, at least 30 minutes before your first meal of the day. Take your medicine at regular intervals. Do not take your medicine more often than directed. Do not stop taking except on your doctor's advice. A special MedGuide will be given to you by the pharmacist with each prescription and refill. Be sure to read this information carefully each time. Talk to your pediatrician regarding the use of this medicine in children. This medicine is not approved for use in children. Overdosage: If you think you have taken too much of this medicine contact a poison control center or emergency room at once. NOTE: This medicine is only for you. Do not share this medicine with others. What if I miss a dose? If you miss a dose, just skip that dose. Wait until your next dose, and take only that dose. Do not take double or extra doses. What may interact with this medicine?  certain medicines for bowel  problems or bladder incontinence (these can cause constipation) This list may not describe all possible interactions. Give your health care provider a list of all the medicines, herbs, non-prescription drugs, or dietary supplements you use. Also tell them if you smoke, drink alcohol, or use illegal drugs. Some items may interact with your medicine. What should I watch for while using this medicine? Visit your doctor for regular check ups. Tell your doctor if your symptoms do not get better or if they get worse. Diarrhea is a common side effect of this medicine. It often begins within 2 weeks of starting this medicine. Stop taking this medicine and call your doctor if you get severe diarrhea. Stop taking this medicine and call your doctor or go to the nearest hospital emergency room right away if you develop unusual or severe stomach-area (abdominal) pain, especially if you also have bright red, bloody stools or black stools that look like tar. What side effects may I notice from receiving this medicine? Side effects that you should report to your doctor or health care professional as soon as possible:  allergic reactions like skin rash, itching or hives, swelling of the face, lips, or tongue  black, tarry stools  bloody or watery diarrhea  new or worsening stomach pain  severe or prolonged diarrhea Side effects that usually do not require medical attention (report to your doctor or health care professional if they continue or are bothersome):  bloating  gas  loose stools This list may not describe all possible side effects. Call your doctor for medical advice   about side effects. You may report side effects to FDA at 1-800-FDA-1088. Where should I keep my medicine? Keep out of the reach of children. Store at room temperature between 20 and 25 degrees C (68 and 77 degrees F). Keep this medicine in the original container. Keep tightly closed in a dry place. Do not remove the desiccant packet  from the bottle, it helps to protect your medicine from moisture. Throw away any unused medicine after the expiration date. NOTE: This sheet is a summary. It may not cover all possible information. If you have questions about this medicine, talk to your doctor, pharmacist, or health care provider.  2020 Elsevier/Gold Standard (2015-11-12 12:17:04)  

## 2020-08-11 ENCOUNTER — Ambulatory Visit: Payer: Self-pay | Admitting: *Deleted

## 2020-08-11 ENCOUNTER — Encounter: Payer: Self-pay | Admitting: Physician Assistant

## 2020-08-11 ENCOUNTER — Other Ambulatory Visit: Payer: Self-pay | Admitting: Physician Assistant

## 2020-08-11 DIAGNOSIS — I701 Atherosclerosis of renal artery: Secondary | ICD-10-CM

## 2020-08-11 DIAGNOSIS — E7800 Pure hypercholesterolemia, unspecified: Secondary | ICD-10-CM

## 2020-08-11 DIAGNOSIS — I10 Essential (primary) hypertension: Secondary | ICD-10-CM

## 2020-08-11 MED ORDER — DILTIAZEM HCL 120 MG PO TABS: 120.0000 mg | ORAL_TABLET | Freq: Every day | ORAL | 0 refills | Status: DC

## 2020-08-11 MED ORDER — ATORVASTATIN CALCIUM 40 MG PO TABS: 40.0000 mg | ORAL_TABLET | Freq: Every day | ORAL | 0 refills | Status: DC

## 2020-08-11 MED ORDER — SPIRONOLACTONE 25 MG PO TABS: 25.0000 mg | ORAL_TABLET | Freq: Every day | ORAL | 0 refills | Status: DC

## 2020-08-11 NOTE — Telephone Encounter (Signed)
atorvastatin (LIPITOR) 40 MG tablet Medication Date: 05/06/2020 Department: [facility] Ordering/Authorizing: Margaretann Loveless, PA-C   spironolactone (ALDACTONE) 25 MG tablet Medication Date: 05/06/2020 Department: [facility] Ordering/Authorizing: Margaretann Loveless, PA-C   diltiazem (CARDIZEM) 120 MG tablet Medication Date: 05/06/2020 Department: [facility] Ordering/Authorizing: Margaretann Loveless, PA-C   [facility] - [place], [place] - [address] Phone:  [phone]  Fax:  [phone]

## 2020-08-11 NOTE — Telephone Encounter (Signed)
Requested Prescriptions  Pending Prescriptions Disp Refills  . diltiazem (CARDIZEM) 120 MG tablet 90 tablet 0    Sig: Take 1 tablet (120 mg total) by mouth daily.     Cardiovascular:  Calcium Channel Blockers Passed - 08/11/2020  3:53 PM      Passed - Last BP in normal range    BP Readings from Last 1 Encounters:  08/07/20 123/74         Passed - Valid encounter within last 6 months    Recent Outpatient Visits          4 days ago Need for influenza vaccination   [facility] [place], [place], [place]   3 months ago Benign essential HTN   [facility] [place], [place]   9 months ago RLS (restless legs syndrome)   [facility] [place], [place], [place]   1 year ago Swelling of right elbow   [facility] [place], [place], [place]   1 year ago Venous insufficiency   [facility] [place], [place], [place]      Future Appointments            In 3 months Burnette, [place], PA-C [facility]           . atorvastatin (LIPITOR) 40 MG tablet 90 tablet 0    Sig: Take 1 tablet (40 mg total) by mouth daily.     Cardiovascular:  Antilipid - Statins Failed - 08/11/2020  3:53 PM      Failed - LDL in normal range and within 360 days    LDL Chol Calc (NIH)  Date Value Ref Range Status  02/03/2020 64 0 - 99 mg/dL Final         Passed - Total Cholesterol in normal range and within 360 days    Cholesterol, Total  Date Value Ref Range Status  02/03/2020 154 100 - 199 mg/dL Final         Passed - HDL in normal range and within 360 days    HDL  Date Value Ref Range Status  02/03/2020 70 >39 mg/dL Final         Passed - Triglycerides in normal range and within 360 days    Triglycerides  Date Value Ref Range Status  02/03/2020 117 0 - 149 mg/dL Final         Passed - Patient is not pregnant      Passed - Valid encounter within last 12 months    Recent Outpatient Visits           4 days ago Need for influenza vaccination   [facility] [place], [place], [place]   3 months ago Benign essential HTN   [facility] [place], [place]   9 months ago RLS (restless legs syndrome)   [facility] [place], [place], [place]   1 year ago Swelling of right elbow   [facility] [place], [place], [place]   1 year ago Venous insufficiency   [facility] [place], [place], [place]      Future Appointments            In 3 months Burnette, Alessandra Be[place]s, PA-C [facility]           . spironolactone (ALDACTONE) 25 MG tablet 90 tablet 0    Sig: Take 1 tablet (25 mg total) by mouth daily.     Cardiovascular: Diuretics -  Aldosterone Antagonist Failed - 08/11/2020  3:53 PM      Failed - Cr in normal range and within 360 days    Creatinine  Date Value Ref Range Status  03/22/2014 0.91 0.60 - 1.30 mg/dL Final   Creatinine, Ser  Date Value Ref Range Status  02/03/2020 1.04 (H) 0.57 - 1.00 mg/dL Final         Passed - K in normal range and within 360 days    Potassium  Date Value Ref Range Status  02/03/2020 4.4 3.5 - 5.2 mmol/L Final  03/22/2014 2.9 (L) 3.5 - 5.1 mmol/L Final         Passed - Na in normal range and within 360 days    Sodium  Date Value Ref Range Status  02/03/2020 137 134 - 144 mmol/L Final  03/22/2014 135 (L) 136 - 145 mmol/L Final         Passed - Last BP in normal range    BP Readings from Last 1 Encounters:  08/07/20 123/74         Passed - Valid encounter within last 6 months    Recent Outpatient Visits          4 days ago Need for influenza vaccination   [facility] [place], [place], [place]   3 months ago Benign essential HTN   [facility] [place], [place]   9 months ago RLS (restless legs syndrome)   [facility] [place], [place], [place]   1 year ago Swelling of right elbow    [facility] [place], [place], [place]   1 year ago Venous insufficiency   [facility] [place], [place], [place]      Future Appointments            In 3 months Burnette, Alessandra [place], PA-C [facility]

## 2020-08-11 NOTE — Telephone Encounter (Signed)
CB pt at [phone]  Pt states that she heard on the news that older pt were to not continue to take aspirin and wanted to have a nurse to call her Patient called to get information if she should continue taking aspirin 325 mg as ordered because she saw on the news older adults should not take aspirin. Patient is requesting PCP be notified and if she should continue taking her aspirin. Recommended patient continue taking aspirin as ordered until she is notified by PCP to stop. Care advise given. Patient verbalized understanding of care advise and to call back if she has further questions. Please advise .   Reason for Disposition . [1] Caller requesting NON-URGENT health information AND [2] PCP's office is the best resource  Answer Assessment - Initial Assessment Questions 1. REASON FOR CALL or QUESTION: "What is your reason for calling today?" or "How can I best help you?" or "What question do you have that I can help answer?"     Saw on the news a reports older adults should not be taking aspirin.Should I still be taking aspirin 325 mg  Protocols used: INFORMATION ONLY CALL - NO TRIAGE-A-AH

## 2020-08-12 NOTE — Telephone Encounter (Signed)
She has reasons the she needs the aspirin 325mg . I would recommend for her to continue taking this medication

## 2020-08-12 NOTE — Telephone Encounter (Signed)
Pt advised.   Thanks,   -Starlee Corralejo  

## 2020-08-20 ENCOUNTER — Other Ambulatory Visit: Payer: Self-pay

## 2020-08-20 ENCOUNTER — Telehealth: Payer: Self-pay | Admitting: *Deleted

## 2020-08-20 ENCOUNTER — Ambulatory Visit
Admission: RE | Admit: 2020-08-20 | Discharge: 2020-08-20 | Disposition: A | Discharge status: Home / Self Care | Payer: Medicare Other | Source: Ambulatory Visit | Attending: Oncology | Admitting: Oncology

## 2020-08-20 DIAGNOSIS — Z87891 Personal history of nicotine dependence: Secondary | ICD-10-CM | POA: Diagnosis not present

## 2020-08-20 DIAGNOSIS — Z122 Encounter for screening for malignant neoplasm of respiratory organs: Secondary | ICD-10-CM | POA: Insufficient documentation

## 2020-08-20 NOTE — Telephone Encounter (Signed)
Called report  IMPRESSION: 1. Lung-RADS 4B, suspicious. Additional imaging evaluation or consultation with Pulmonology or Thoracic Surgery recommended. 2. Coronary artery calcifications.  Aortic Atherosclerosis (ICD10-I70.0) and Emphysema (ICD10-J43.9).  These results will be called to the ordering clinician or representative by the Radiologist Assistant, and communication documented in the PACS or [employer].   Electronically Signed   By: Signa Kell M.D.   On: 08/20/2020 15:47

## 2020-08-21 ENCOUNTER — Telehealth: Payer: Self-pay | Admitting: *Deleted

## 2020-08-21 ENCOUNTER — Other Ambulatory Visit: Payer: Self-pay | Admitting: *Deleted

## 2020-08-21 DIAGNOSIS — R911 Solitary pulmonary nodule: Secondary | ICD-10-CM

## 2020-08-21 NOTE — Telephone Encounter (Signed)
After review with pulmonology, contacted patient and reviewed CT scan findings with patient and husband. Given recommendation for PET scan with appointment 09/01/20 at 12noon arrival (patient was not able to come for first available appt). Patient verbalizes understanding and can contact me if needed.  IMPRESSION: 1. Lung-RADS 4B, suspicious. Additional imaging evaluation or consultation with Pulmonology or Thoracic Surgery recommended. 2. Coronary artery calcifications.  Aortic Atherosclerosis (ICD10-I70.0) and Emphysema (ICD10-J43.9).

## 2020-08-24 ENCOUNTER — Encounter: Payer: Self-pay | Admitting: *Deleted

## 2020-08-24 DIAGNOSIS — R918 Other nonspecific abnormal finding of lung field: Secondary | ICD-10-CM

## 2020-08-24 NOTE — Progress Notes (Signed)
  Oncology Nurse Navigator Documentation  Navigator Location: [facility] (08/24/20 1400) Referral Date to RadOnc/MedOnc: 08/21/20 (08/24/20 1400) )Navigator Encounter Type: Introductory Phone Call (08/24/20 1400)   Abnormal Finding Date: 08/20/20 (08/24/20 1400)                   Treatment Phase: Abnormal Scans (08/24/20 1400) Barriers/Navigation Needs: Coordination of Care (08/24/20 1400)   Interventions: Coordination of Care;Referrals (08/24/20 1400) Referrals: Pulmonary (08/24/20 1400) Coordination of Care: Appts (08/24/20 1400)     phone call made to patient to introduce to navigator services and review upcoming appts. All questions answered during phone call. Appts reviewed. Contact info given and instructed to call with any further questions or needs. Pt verbalized understanding. Nothing further needed at this time.             Time Spent with Patient: 30 (08/24/20 1400)

## 2020-08-25 ENCOUNTER — Encounter: Payer: Self-pay | Admitting: Pain Medicine

## 2020-08-25 ENCOUNTER — Other Ambulatory Visit: Payer: Self-pay

## 2020-08-25 ENCOUNTER — Ambulatory Visit
Admission: RE | Admit: 2020-08-25 | Discharge: 2020-08-25 | Disposition: A | Discharge status: Home / Self Care | Payer: Medicare Other | Source: Ambulatory Visit | Attending: Pain Medicine | Admitting: Pain Medicine

## 2020-08-25 ENCOUNTER — Other Ambulatory Visit: Payer: Medicare Other

## 2020-08-25 ENCOUNTER — Ambulatory Visit (HOSPITAL_BASED_OUTPATIENT_CLINIC_OR_DEPARTMENT_OTHER): Payer: Medicare Other | Admitting: Pain Medicine

## 2020-08-25 VITALS — BP 116/68 | HR 69 | Temp 98.1°F | Resp 15 | Ht 59.0 in | Wt 100.0 lb

## 2020-08-25 DIAGNOSIS — M5442 Lumbago with sciatica, left side: Secondary | ICD-10-CM | POA: Diagnosis present

## 2020-08-25 DIAGNOSIS — G8929 Other chronic pain: Secondary | ICD-10-CM | POA: Diagnosis present

## 2020-08-25 DIAGNOSIS — M5137 Other intervertebral disc degeneration, lumbosacral region: Secondary | ICD-10-CM | POA: Insufficient documentation

## 2020-08-25 DIAGNOSIS — M5136 Other intervertebral disc degeneration, lumbar region: Secondary | ICD-10-CM | POA: Diagnosis not present

## 2020-08-25 DIAGNOSIS — M533 Sacrococcygeal disorders, not elsewhere classified: Secondary | ICD-10-CM | POA: Insufficient documentation

## 2020-08-25 DIAGNOSIS — M47817 Spondylosis without myelopathy or radiculopathy, lumbosacral region: Secondary | ICD-10-CM | POA: Diagnosis present

## 2020-08-25 DIAGNOSIS — M461 Sacroiliitis, not elsewhere classified: Secondary | ICD-10-CM | POA: Diagnosis not present

## 2020-08-25 DIAGNOSIS — M5388 Other specified dorsopathies, sacral and sacrococcygeal region: Secondary | ICD-10-CM | POA: Insufficient documentation

## 2020-08-25 DIAGNOSIS — M47816 Spondylosis without myelopathy or radiculopathy, lumbar region: Secondary | ICD-10-CM

## 2020-08-25 DIAGNOSIS — M5441 Lumbago with sciatica, right side: Secondary | ICD-10-CM | POA: Insufficient documentation

## 2020-08-25 MED ORDER — LIDOCAINE HCL 2 % IJ SOLN
20.0000 mL | Freq: Once | INTRAMUSCULAR | Status: AC
  Administered 2020-08-25: 400 mg
  Filled 2020-08-25: qty 20

## 2020-08-25 MED ORDER — FENTANYL CITRATE (PF) 100 MCG/2ML IJ SOLN
INTRAMUSCULAR | Status: AC
  Filled 2020-08-25: qty 2

## 2020-08-25 MED ORDER — ROPIVACAINE HCL 2 MG/ML IJ SOLN
9.0000 mL | Freq: Once | INTRAMUSCULAR | Status: AC
  Administered 2020-08-25: 9 mL via PERINEURAL
  Filled 2020-08-25: qty 10

## 2020-08-25 MED ORDER — LACTATED RINGERS IV SOLN
1000.0000 mL | Freq: Once | INTRAVENOUS | Status: AC
  Administered 2020-08-25: 1000 mL via INTRAVENOUS

## 2020-08-25 MED ORDER — ROPIVACAINE HCL 2 MG/ML IJ SOLN
4.0000 mL | Freq: Once | INTRAMUSCULAR | Status: AC
  Administered 2020-08-25: 4 mL via INTRA_ARTICULAR
  Filled 2020-08-25: qty 10

## 2020-08-25 MED ORDER — METHYLPREDNISOLONE ACETATE 80 MG/ML IJ SUSP
80.0000 mg | Freq: Once | INTRAMUSCULAR | Status: AC
  Administered 2020-08-25: 80 mg via INTRA_ARTICULAR
  Filled 2020-08-25: qty 1

## 2020-08-25 MED ORDER — MIDAZOLAM HCL 5 MG/5ML IJ SOLN
1.0000 mg | INTRAMUSCULAR | Status: DC | PRN
  Administered 2020-08-25: 1 mg via INTRAVENOUS
  Filled 2020-08-25: qty 5

## 2020-08-25 MED ORDER — TRIAMCINOLONE ACETONIDE 40 MG/ML IJ SUSP
40.0000 mg | Freq: Once | INTRAMUSCULAR | Status: AC
  Administered 2020-08-25: 40 mg
  Filled 2020-08-25: qty 1

## 2020-08-25 NOTE — Patient Instructions (Signed)

## 2020-08-25 NOTE — Progress Notes (Signed)
PROVIDER NOTE: Information contained herein reflects review and annotations entered in association with encounter. Interpretation of such information and data should be left to medically-trained personnel. Information provided to patient can be located elsewhere in the medical record under "Patient Instructions". Document created using STT-dictation technology, any transcriptional errors that may result from process are unintentional.    Patient: Suzanne Avila  Service Category: Procedure  Provider: Oswaldo Done, MD  DOB: July 22, 1946  DOS: 08/25/2020  Location: [facility]  MRN: [id number]  Setting: Ambulatory - outpatient  Referring Provider: Suella Grove*  Type: Established Patient  Specialty: Interventional Pain Management  PCP: Margaretann Loveless, PA-C   Primary Reason for Visit: Interventional Pain Management Treatment. CC: Back Pain (lower)  Procedure:          Anesthesia, Analgesia, Anxiolysis:  Procedure #1: Type: Medial Branch Facet Block #3 Primary Purpose: Palliative Region: Lumbar Level: L2, L3, L4, L5, & S1 Medial Branch Level(s) Target Area: For Lumbar Facet blocks, the target is the groove formed by the junction of the transverse process and superior articular process. For the L5 dorsal ramus, the target is the notch between superior articular process and sacral ala. For the S1 dorsal ramus, the target is the superior and lateral edge of the posterior S1 Sacral foramen. Approach: Posterior, paramedial, percutaneous approach. Laterality: Left  Procedure #2: Type: Sacroiliac Joint Block  #3  Primary Purpose: Palliative Region: Posterior Lumbosacral Level: PSIS (Posterior Superior Iliac Spine) Sacroiliac Joint Target Area: For upper sacroiliac joint block(s), the target is the superior and posterior margin of the sacroiliac joint. Approach: Ipsilateral approach. Laterality: Left  Type: Moderate (Conscious) Sedation combined with Local  Anesthesia Indication(s): Analgesia and Anxiety Route: Intravenous (IV) IV Access: Secured Sedation: Meaningful verbal contact was maintained at all times during the procedure  Local Anesthetic: Lidocaine 1-2%  Position: Prone   Indications: 1. 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 Spondylosis without myelopathy or radiculopathy, lumbosacral region   3. Chronic sacroiliac joint pain (Left)   4. Other specified dorsopathies, sacral and sacrococcygeal region   5. DDD (degenerative disc disease), lumbosacral   6. Chronic low back pain (Secondary area of Pain) (Bilateral) (L>R)    Pain Score: Pre-procedure: 3 /10 Post-procedure: 0-No pain/10   Pre-op Assessment:  Suzanne Avila is a 74 y.o. (year old), female patient, seen today for interventional treatment. She  has a past surgical history that includes Back surgery; Eye surgery; and Carpal tunnel release (Left). Suzanne Avila has a current medication list which includes the following prescription(s): albuterol, aspirin, atorvastatin, diltiazem, dorzolamide, fluticasone-salmeterol, furosemide, hydrocodone-acetaminophen, [START ON 09/05/2020] hydrocodone-acetaminophen, [START ON 10/05/2020] hydrocodone-acetaminophen, latanoprost, linaclotide, magnesium gluconate, meloxicam, multi-vitamin, multiple vitamins-minerals, omeprazole, pregabalin, ramipril, ropinirole, spironolactone, and vitamin b-12, and the following Facility-Administered Medications: midazolam. Her primarily concern today is the Back Pain (lower)  Initial Vital Signs:  Pulse/HCG Rate: 69ECG Heart Rate: 70 Temp: 98.1 F (36.7 C) Resp: 18 BP: 115/85 SpO2: 99 %  BMI: Estimated body mass index is 20.2 kg/m as calculated from the following:   Height as of this encounter: 4\' 11"  (1.499 m).   Weight as of this encounter: 100 lb (45.4 kg).  Risk Assessment: Allergies: Reviewed. She is allergic to codeine, cyclobenzaprine, and penicillins.  Allergy Precautions: None  required Coagulopathies: Reviewed. None identified.  Blood-thinner therapy: None at this time Active Infection(s): Reviewed. None identified. Suzanne Avila is afebrile  Site Confirmation: Suzanne Avila was asked to confirm the procedure and laterality before marking  the site Procedure checklist: Completed Consent: Before the procedure and under the influence of no sedative(s), amnesic(s), or anxiolytics, the patient was informed of the treatment options, risks and possible complications. To fulfill our ethical and legal obligations, as recommended by the American Medical Association's Code of Ethics, I have informed the patient of my clinical impression; the nature and purpose of the treatment or procedure; the risks, benefits, and possible complications of the intervention; the alternatives, including doing nothing; the risk(s) and benefit(s) of the alternative treatment(s) or procedure(s); and the risk(s) and benefit(s) of doing nothing. The patient was provided information about the general risks and possible complications associated with the procedure. These may include, but are not limited to: failure to achieve desired goals, infection, bleeding, organ or nerve damage, allergic reactions, paralysis, and death. In addition, the patient was informed of those risks and complications associated to Spine-related procedures, such as failure to decrease pain; infection (i.e.: Meningitis, epidural or intraspinal abscess); bleeding (i.e.: epidural hematoma, subarachnoid hemorrhage, or any other type of intraspinal or peri-dural bleeding); organ or nerve damage (i.e.: Any type of peripheral nerve, nerve root, or spinal cord injury) with subsequent damage to sensory, motor, and/or autonomic systems, resulting in permanent pain, numbness, and/or weakness of one or several areas of the body; allergic reactions; (i.e.: anaphylactic reaction); and/or death. Furthermore, the patient was informed of those risks and  complications associated with the medications. These include, but are not limited to: allergic reactions (i.e.: anaphylactic or anaphylactoid reaction(s)); adrenal axis suppression; blood sugar elevation that in diabetics may result in ketoacidosis or comma; water retention that in patients with history of congestive heart failure may result in shortness of breath, pulmonary edema, and decompensation with resultant heart failure; weight gain; swelling or edema; medication-induced neural toxicity; particulate matter embolism and blood vessel occlusion with resultant organ, and/or nervous system infarction; and/or aseptic necrosis of one or more joints. Finally, the patient was informed that Medicine is not an exact science; therefore, there is also the possibility of unforeseen or unpredictable risks and/or possible complications that may result in a catastrophic outcome. The patient indicated having understood very clearly. We have given the patient no guarantees and we have made no promises. Enough time was given to the patient to ask questions, all of which were answered to the patient's satisfaction. Ms. Stingley has indicated that she wanted to continue with the procedure. Attestation: I, the ordering provider, attest that I have discussed with the patient the benefits, risks, side-effects, alternatives, likelihood of achieving goals, and potential problems during recovery for the procedure that I have provided informed consent. Date  Time: 08/25/2020  9:51 AM  Pre-Procedure Preparation:  Monitoring: As per clinic protocol. Respiration, ETCO2, SpO2, BP, heart rate and rhythm monitor placed and checked for adequate function Safety Precautions: Patient was assessed for positional comfort and pressure points before starting the procedure. Time-out: I initiated and conducted the "Time-out" before starting the procedure, as per protocol. The patient was asked to participate by confirming the accuracy of the  "Time Out" information. Verification of the correct person, site, and procedure were performed and confirmed by me, the nursing staff, and the patient. "Time-out" conducted as per Joint Commission's Universal Protocol (UP.01.01.01). Time: 1019  Description of Procedure #1:   Time-out: "Time-out" completed before starting procedure, as per protocol. Area Prepped: Entire Posterior Lumbosacral Region DuraPrep (Iodine Povacrylex [0.7% available iodine] and Isopropyl Alcohol, 74% w/w) Safety Precautions: Aspiration looking for blood return was conducted prior to all injections.  At no point did we inject any substances, as a needle was being advanced. No attempts were made at seeking any paresthesias. Safe injection practices and needle disposal techniques used. Medications properly checked for expiration dates. SDV (single dose vial) medications used.  Description of the Procedure: Protocol guidelines were followed. The patient was placed in position over the fluoroscopy table. The target area was identified and the area prepped in the usual manner. Skin & deeper tissues infiltrated with local anesthetic. Appropriate amount of time allowed to pass for local anesthetics to take effect. The procedure needle was introduced through the skin, ipsilateral to the reported pain, and advanced to the target area. Employing the "Medial Branch Technique", the needles were advanced to the angle made by the superior and medial portion of the transverse process, and the lateral and inferior portion of the superior articulating process of the targeted vertebral bodies. This area is known as "Burton's Eye" or the "Eye of the [place] Dog". A procedure needle was introduced through the skin, and this time advanced to the angle made by the superior and medial border of the sacral ala, and the lateral border of the S1 vertebral body. This last needle was later repositioned at the superior and lateral border of the posterior S1  foramen. Negative aspiration confirmed. Solution injected in intermittent fashion, asking for systemic symptoms every 0.5cc of injectate. The needles were then removed and the area cleansed, making sure to leave some of the prepping solution back to take advantage of its long term bactericidal properties. Start Time: 1020 hrs. Materials:  Needle(s) Type: Spinal Needle Gauge: 22G Length: 3.5-in Medication(s): Please see orders for medications and dosing details.  Description of Procedure # 2:   Area Prepped: Entire Posterior Lumbosacral Region DuraPrep (Iodine Povacrylex [0.7% available iodine] and Isopropyl Alcohol, 74% w/w) Safety Precautions: Aspiration looking for blood return was conducted prior to all injections. At no point did we inject any substances, as a needle was being advanced. No attempts were made at seeking any paresthesias. Safe injection practices and needle disposal techniques used. Medications properly checked for expiration dates. SDV (single dose vial) medications used. Description of the Procedure: Protocol guidelines were followed. The patient was placed in position over the fluoroscopy table. The target area was identified and the area prepped in the usual manner. Skin & deeper tissues infiltrated with local anesthetic. Appropriate amount of time allowed to pass for local anesthetics to take effect. The procedure needle was advanced under fluoroscopic guidance into the sacroiliac joint until a firm endpoint was obtained. Proper needle placement secured. Negative aspiration confirmed. Solution injected in intermittent fashion, asking for systemic symptoms every 0.5cc of injectate. The needles were then removed and the area cleansed, making sure to leave some of the prepping solution back to take advantage of its long term bactericidal properties. Vitals:   08/25/20 1030 08/25/20 1040 08/25/20 1050 08/25/20 1100  BP: 116/64 118/62 116/63 116/68  Pulse:      Resp:  16 16 15    Temp:  98.3 F (36.8 C)  98.1 F (36.7 C)  TempSrc:      SpO2: 99% 99% 99% 99%  Weight:      Height:        End Time: 1028 hrs. Materials:  Needle(s) Type: Spinal Needle Gauge: 22G Length: 3.5-in Medication(s): Please see orders for medications and dosing details.  Imaging Guidance (Spinal):          Type of Imaging Technique: Fluoroscopy Guidance (Spinal) Indication(s): Assistance in  needle guidance and placement for procedures requiring needle placement in or near specific anatomical locations not easily accessible without such assistance. Exposure Time: Please see nurses notes. Contrast: None used. Fluoroscopic Guidance: I was personally present during the use of fluoroscopy. "Tunnel Vision Technique" used to obtain the best possible view of the target area. Parallax error corrected before commencing the procedure. "Direction-depth-direction" technique used to introduce the needle under continuous pulsed fluoroscopy. Once target was reached, antero-posterior, oblique, and lateral fluoroscopic projection used confirm needle placement in all planes. Images permanently stored in EMR. Interpretation: No contrast injected. I personally interpreted the imaging intraoperatively. Adequate needle placement confirmed in multiple planes. Permanent images saved into the patient's record.  Antibiotic Prophylaxis:   Anti-infectives (From admission, onward)   None     Indication(s): None identified  Post-operative Assessment:  Post-procedure Vital Signs:  Pulse/HCG Rate: 6968 Temp: 98.1 F (36.7 C) Resp: 15 BP: 116/68 SpO2: 99 %  EBL: None  Complications: No immediate post-treatment complications observed by team, or reported by patient.  Note: The patient tolerated the entire procedure well. A repeat set of vitals were taken after the procedure and the patient was kept under observation following institutional policy, for this type of procedure. Post-procedural neurological  assessment was performed, showing return to baseline, prior to discharge. The patient was provided with post-procedure discharge instructions, including a section on how to identify potential problems. Should any problems arise concerning this procedure, the patient was given instructions to immediately contact us, at any time, without hesitation. In any case, we plan to contact the patient by telephone for a follow-up status report regarding this interventional procedure.  Comments:  No additional relevant information.  Plan of Care  Orders:  Orders Placed This Encounter  Procedures  . LUMBAR FACET(MEDIAL BRANCH NERVE BLOCK) MBNB    Scheduling Instructions:     Procedure: Lumbar facet block (AKA.: Lumbosacral medial branch nerve block)     Side: Left-sided     Level: L3-4, L4-5, & L5-S1 Facets (L2, L3, L4, L5, & S1 Medial Branch Nerves)     Sedation: Patient's choice.     Timeframe: Today    Order Specific Question:   Where will this procedure be performed?    Answer:   [facility]  . SACROILIAC JOINT INJECTION    Scheduling Instructions:     Side: Left-sided     Sedation: Patient's choice.     Timeframe: Today    Order Specific Question:   Where will this procedure be performed?    Answer:   [facility]  . DG PAIN CLINIC C-ARM 1-60 MIN NO REPORT    Intraoperative interpretation by procedural physician at [facility].    Standing Status:   Standing    Number of Occurrences:   1    Order Specific Question:   Reason for exam:    Answer:   Assistance in needle guidance and placement for procedures requiring needle placement in or near specific anatomical locations not easily accessible without such assistance.  . Informed Consent Details: Physician/Practitioner Attestation; Transcribe to consent form and obtain patient signature    Nursing Order: Transcribe to consent form and obtain patient signature. Note: Always confirm laterality of pain with Ms.  Claiborne Billings, before procedure.    Order Specific Question:   Physician/Practitioner attestation of informed consent for procedure/surgical case    Answer:   I, the physician/practitioner, attest that I have discussed with the patient the benefits, risks, side effects, alternatives,  likelihood of achieving goals and potential problems during recovery for the procedure that I have provided informed consent.    Order Specific Question:   Procedure    Answer:   Lumbar Facet Block  under fluoroscopic guidance    Order Specific Question:   Physician/Practitioner performing the procedure    Answer:   Caron Ode A. Laban EmperorNaveira MD    Order Specific Question:   Indication/Reason    Answer:   Low Back Pain, with our without leg pain, due to Facet Joint Arthralgia (Joint Pain) Spondylosis (Arthritis of the Spine), without myelopathy or radiculopathy (Nerve Damage).  . Informed Consent Details: Physician/Practitioner Attestation; Transcribe to consent form and obtain patient signature    Nursing Order: Transcribe to consent form and obtain patient signature. Note: Always confirm laterality of pain with Ms. Claiborne Billingsallahan, before procedure.    Order Specific Question:   Physician/Practitioner attestation of informed consent for procedure/surgical case    Answer:   I, the physician/practitioner, attest that I have discussed with the patient the benefits, risks, side effects, alternatives, likelihood of achieving goals and potential problems during recovery for the procedure that I have provided informed consent.    Order Specific Question:   Procedure    Answer:   Sacroiliac Joint Block    Order Specific Question:   Physician/Practitioner performing the procedure    Answer:   Junnie Loschiavo A. Laban EmperorNaveira, MD    Order Specific Question:   Indication/Reason    Answer:   Chronic Low Back and Hip Pain secondary to Sacroiliac Joint Pain (Arthralgia/Arthropathy)  . Provide equipment / supplies at bedside    "Block Tray" (Disposable   single use) Needle type: SpinalSpinal Amount/quantity: 5 Size: Regular (3-3.5-inch) Gauge: 22G    Standing Status:   Standing    Number of Occurrences:   1    Order Specific Question:   Specify    Answer:   Block Tray   Chronic Opioid Analgesic:  Hydrocodone/APAP 7.5/325, 1 tab PO q 6 hrs (30 mg/day of hydrocodone) MME/day:30 mg/day.   Medications ordered for procedure: Meds ordered this encounter  Medications  . lidocaine (XYLOCAINE) 2 % (with pres) injection 400 mg  . lactated ringers infusion 1,000 mL  . midazolam (VERSED) 5 MG/5ML injection 1-2 mg    Make sure Flumazenil is available in the pyxis when using this medication. If oversedation occurs, administer 0.2 mg IV over 15 sec. If after 45 sec no response, administer 0.2 mg again over 1 min; may repeat at 1 min intervals; not to exceed 4 doses (1 mg)  . ropivacaine (PF) 2 mg/mL (0.2%) (NAROPIN) injection 9 mL  . triamcinolone acetonide (KENALOG-40) injection 40 mg  . ropivacaine (PF) 2 mg/mL (0.2%) (NAROPIN) injection 4 mL  . methylPREDNISolone acetate (DEPO-MEDROL) injection 80 mg   Medications administered: We administered lidocaine, lactated ringers, midazolam, ropivacaine (PF) 2 mg/mL (0.2%), triamcinolone acetonide, ropivacaine (PF) 2 mg/mL (0.2%), and methylPREDNISolone acetate.  See the medical record for exact dosing, route, and time of administration.  Follow-up plan:   Return for (VV), (PP) Follow-up.       Considering:   Diagnostic bilateral IA hip joint injection  Possible bilateral hip joint RFA  Possible bilateral lumbar facet RFA  Possible left SI joint RFA  Diagnostic bilateral L4-L5 TFESI  Diagnostic caudal ESI  Diagnostic caudal epidurogram  Possible RACZ epidural lysis of adhesions  Diagnostic left L2-3 LESI  Possible bilateral Lumbar SCS trial implant    Palliative PRN treatment(s):   Diagnostic right lumbar  facet block #2  Diagnostic left lumbar facet block #3  Diagnostic left SI joint  block #3     Recent Visits Date Type Provider Dept  07/27/20 Office Visit Delano Metz, MD [facility]  06/09/20 Telemedicine Delano Metz, MD [facility]  Showing recent visits within past 90 days and meeting all other requirements Today's Visits Date Type Provider Dept  08/25/20 Procedure visit Delano Metz, MD [facility]  Showing today's visits and meeting all other requirements Future Appointments Date Type Provider Dept  09/10/20 Appointment Delano Metz, MD [facility]  11/02/20 Appointment Delano Metz, MD [facility]  Showing future appointments within next 90 days and meeting all other requirements  Disposition: Discharge home  Discharge (Date  Time): 08/25/2020; 1101 hrs.   Primary Care Physician: Margaretann Loveless, PA-C Location: [facility] Note by: Oswaldo Done, MD Date: 08/25/2020; Time: 11:58 AM  Disclaimer:  Medicine is not an Visual merchandiser. The only guarantee in medicine is that nothing is guaranteed. It is important to note that the decision to proceed with this intervention was based on the information collected from the patient. The Data and conclusions were drawn from the patient's questionnaire, the interview, and the physical examination. Because the information was provided in large part by the patient, it cannot be guaranteed that it has not been purposely or unconsciously manipulated. Every effort has been made to obtain as much relevant data as possible for this evaluation. It is important to note that the conclusions that lead to this procedure are derived in large part from the available data. Always take into account that the treatment will also be dependent on availability of resources and existing treatment guidelines, considered by other Pain Management Practitioners as being common knowledge and practice, at the time of the intervention.  For Medico-Legal purposes, it is also important to point out that variation in procedural techniques and pharmacological choices are the acceptable norm. The indications, contraindications, technique, and results of the above procedure should only be interpreted and judged by a Board-Certified Interventional Pain Specialist with extensive familiarity and expertise in the same exact procedure and technique.

## 2020-08-26 ENCOUNTER — Telehealth: Payer: Self-pay

## 2020-08-26 NOTE — Telephone Encounter (Signed)
Post procedure follow up.  Patient states she is doing good.  

## 2020-09-01 ENCOUNTER — Ambulatory Visit
Admission: RE | Admit: 2020-09-01 | Discharge: 2020-09-01 | Disposition: A | Discharge status: Home / Self Care | Payer: Medicare Other | Source: Ambulatory Visit | Attending: Oncology | Admitting: Oncology

## 2020-09-01 ENCOUNTER — Other Ambulatory Visit: Payer: Self-pay

## 2020-09-01 DIAGNOSIS — R911 Solitary pulmonary nodule: Secondary | ICD-10-CM | POA: Diagnosis not present

## 2020-09-01 LAB — GLUCOSE, CAPILLARY: Glucose-Capillary: 84 mg/dL (ref 70–99)

## 2020-09-01 MED ORDER — FLUDEOXYGLUCOSE F - 18 (FDG) INJECTION
5.2000 | Freq: Once | INTRAVENOUS | Status: AC | PRN
  Administered 2020-09-01: 5.75 via INTRAVENOUS

## 2020-09-03 ENCOUNTER — Other Ambulatory Visit: Payer: Medicare Other

## 2020-09-04 ENCOUNTER — Encounter: Payer: Self-pay | Admitting: Pulmonary Disease

## 2020-09-04 ENCOUNTER — Telehealth: Payer: Self-pay | Admitting: Pulmonary Disease

## 2020-09-04 ENCOUNTER — Encounter: Payer: Self-pay | Admitting: *Deleted

## 2020-09-04 ENCOUNTER — Ambulatory Visit (INDEPENDENT_AMBULATORY_CARE_PROVIDER_SITE_OTHER): Payer: Medicare Other | Admitting: Pulmonary Disease

## 2020-09-04 ENCOUNTER — Inpatient Hospital Stay: Payer: Medicare Other | Attending: Oncology | Admitting: Oncology

## 2020-09-04 ENCOUNTER — Other Ambulatory Visit: Payer: Self-pay

## 2020-09-04 ENCOUNTER — Encounter: Payer: Self-pay | Admitting: Oncology

## 2020-09-04 VITALS — BP 135/63 | HR 92 | Temp 99.7°F | Resp 16 | Ht 59.0 in | Wt 92.4 lb

## 2020-09-04 DIAGNOSIS — K209 Esophagitis, unspecified without bleeding: Secondary | ICD-10-CM

## 2020-09-04 DIAGNOSIS — R918 Other nonspecific abnormal finding of lung field: Secondary | ICD-10-CM | POA: Insufficient documentation

## 2020-09-04 DIAGNOSIS — F17210 Nicotine dependence, cigarettes, uncomplicated: Secondary | ICD-10-CM

## 2020-09-04 DIAGNOSIS — R1310 Dysphagia, unspecified: Secondary | ICD-10-CM | POA: Diagnosis not present

## 2020-09-04 DIAGNOSIS — J449 Chronic obstructive pulmonary disease, unspecified: Secondary | ICD-10-CM | POA: Diagnosis not present

## 2020-09-04 DIAGNOSIS — J984 Other disorders of lung: Secondary | ICD-10-CM

## 2020-09-04 DIAGNOSIS — Z01811 Encounter for preprocedural respiratory examination: Secondary | ICD-10-CM

## 2020-09-04 DIAGNOSIS — K219 Gastro-esophageal reflux disease without esophagitis: Secondary | ICD-10-CM | POA: Diagnosis not present

## 2020-09-04 DIAGNOSIS — J852 Abscess of lung without pneumonia: Secondary | ICD-10-CM

## 2020-09-04 MED ORDER — BREZTRI AEROSPHERE 160-9-4.8 MCG/ACT IN AERO: 2.0000 | INHALATION_SPRAY | Freq: Two times a day (BID) | RESPIRATORY_TRACT | 0 refills | Status: AC

## 2020-09-04 MED ORDER — METRONIDAZOLE 500 MG PO TABS: 500.0000 mg | ORAL_TABLET | Freq: Three times a day (TID) | ORAL | 0 refills | Status: DC

## 2020-09-04 MED ORDER — OMEPRAZOLE 40 MG PO CPDR: 40.0000 mg | DELAYED_RELEASE_CAPSULE | Freq: Every day | ORAL | 3 refills | Status: DC

## 2020-09-04 MED ORDER — CULTURELLE ULTIMATE STRENGTH PO CAPS: 1.0000 | ORAL_CAPSULE | Freq: Every day | ORAL | 2 refills | Status: DC

## 2020-09-04 MED ORDER — MOXIFLOXACIN HCL 400 MG PO TABS: 400.0000 mg | ORAL_TABLET | Freq: Every day | ORAL | 0 refills | Status: DC

## 2020-09-04 MED ORDER — LEVOFLOXACIN 500 MG PO TABS: 500.0000 mg | ORAL_TABLET | Freq: Every day | ORAL | 0 refills | Status: DC

## 2020-09-04 NOTE — Telephone Encounter (Signed)
Patient's son, Rosanne Ashing [facility]) is aware of below message and voiced his understanding.  Rx for Levaquin 500mg  and Flagyl 500mg  has been sent to preferred pharmacy. Verified with [facility] pharmacy that they have both medications in stock.   Nothing further needed.

## 2020-09-04 NOTE — Telephone Encounter (Signed)
Spoke to [facility], who stated that they do not carry Avelox and they would not be able to order this medication.  Suzanne Avila would like to know if Dr. Jayme Cloud would like to order a different medication.   Dr. Jayme Cloud, please advise. Thanks

## 2020-09-04 NOTE — Telephone Encounter (Signed)
Lets do Levaquin 500 mg p.o. daily x10 days and Flagyl 500 mg p.o. 3 times daily x10 days.

## 2020-09-04 NOTE — Telephone Encounter (Signed)
I have spoken to Eschbach with [facility], who stated that they do not have Avelox in stock nor can they order this medication. ATC contact [facility] and was placed on hold for >50min.  Dr. Jayme Cloud, please advise.

## 2020-09-04 NOTE — Progress Notes (Signed)
Subjective:    Patient ID: Suzanne Avila, female    DOB: [birth year], 74 y.o.   MRN: [id number]  Lung [facility] patient  HPI Patient is a 74 year old current smoker (1 PPD) who presents for evaluation of a cavitary lung mass and on 28 October during lung cancer screening imaging.  We are evaluating the patient at the lung [facility]).  She will be evaluated by Dr. Smith Robertao as well.  Patient was discussed during tumor board yesterday.  Patient reports mostly issues with lower back pain.  She has noted however over the last several months that she has had increased issues with cough and choking episodes.  She notes that she has significant dysphagia to pills and they seem to get "stuck in her throat" she also notes that it is very hard to swallow anything.  These episodes of choking lead to coughing and gagging.  She also has a significant globus sensation.  She feels like she has "gas bubbles" in the middle of her chest.  Just 6 days ago while she was sweeping in her home she states that she had "a scare" because of the sensation that she had something stuck in her throat.  He has not had any fevers, chills or sweats that she can recall.  Cough though violent, produces scant sputum, no hemoptysis.  Sputum does not have any color.  She has not noted any significant weight loss.  As noted she smokes a pack of cigarettes per day.  She is not inclined to quit.  She states that she has been on inhalers mainly albuterol as needed, she uses Advair rarely.  Cites issues with Advair due to powdered formulation.  She is also supposed to be on Prilosec daily however takes this on a as needed basis.  She voices no other complaint today.  She feels anxious about the findings in her lungs  I have reviewed the available films independently, patient had a low-dose CT chest for lung cancer screening on 20 August 2020, there was a rounded masslike consolidation noted on the posterolateral right upper lobe  with central cavitation.  On subsequent PET/CT performed 02 September 2020 this had grown significantly, raising the patient of possible infectious process.  There is associated right hilar adenopathy which may be reactive.  There was also uptake on the esophagus suggesting esophagitis versus other.  Review of Systems A 10 point review of systems was performed and it is as noted above otherwise negative.  Past Medical History:  Diagnosis Date   Closed left arm fracture    COPD (chronic obstructive pulmonary disease) (HCC)    Glaucoma    Hyperlipidemia    Hypertension    Lung mass    Past Surgical History:  Procedure Laterality Date   BACK SURGERY     CARPAL TUNNEL RELEASE Left    EYE SURGERY     Patient Active Problem List   Diagnosis Date Noted   Pharmacologic therapy 05/05/2020   Chronic diarrhea 05/04/2020   DDD (degenerative disc disease), lumbosacral 11/19/2019   Therapeutic opioid-induced constipation (OIC) 08/01/2019   Chronic sacroiliac joint pain (Left) 05/01/2019   Chronic groin pain (Left) 05/01/2019   Spondylosis without myelopathy or radiculopathy, lumbosacral region 05/01/2019   Other specified dorsopathies, sacral and sacrococcygeal region 05/01/2019   Renal artery stenosis (HCC) 12/27/2017   Pain in limb 12/22/2017   Tobacco use disorder 12/22/2017   Renal atrophy, right 12/22/2017   Thoracic radiculitis 01/31/2017   Chronic lower extremity  pain (Primary Area of Pain) (Bilateral) (L>R) 04/25/2016   Chronic upper extremity pain (Third area of Pain) (Bilateral) (L>R) 04/25/2016   Disturbance of skin sensation 04/20/2016   Neurogenic pain 04/20/2016   Chronic hip pain (Bilateral) (L>R) 11/11/2015   Vitamin D deficiency 11/11/2015   Edema 09/02/2015   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   Encounter for therapeutic drug level monitoring 08/14/2015   Long term current use of opiate analgesic 08/14/2015   Long term  prescription opiate use 08/14/2015   Uncomplicated opioid dependence (HCC) 08/14/2015   Opiate use (30 MME/Day) 08/14/2015   Chronic low back pain (Secondary area of Pain) (Bilateral) (L>R) 08/14/2015   Chronic pain syndrome 08/14/2015   Alcoholic (HCC) 08/14/2015   B12 deficiency 08/14/2015   Narrowing of intervertebral disc space 08/14/2015   Encounter for screening for diabetes mellitus 08/14/2015   History of metabolic disorder 08/14/2015   Fungal infection of toenail 08/14/2015   Osteopenia 08/14/2015   Compulsive tobacco user syndrome 08/14/2015   Osteoarthritis of hip (Left) 08/14/2015   Chronic hip pain (Left) 08/14/2015   Chronic lumbar radicular pain (L5 dermatome) (Bilateral) (L>R) 08/14/2015   Lumbar facet syndrome (Bilateral) (L>R) 08/14/2015   Lumbar spondylosis 08/14/2015   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) 08/14/2015   Lumbar Postlaminectomy syndrome (L4-5) 08/14/2015   Fibromyalgia 08/14/2015   Chronic airway obstruction (HCC) 04/21/2015   Allergic rhinitis 02/10/2010   Atrial paroxysmal tachycardia (HCC) 07/06/2009   CAFL (chronic airflow limitation) (HCC) 07/06/2009   Acid reflux 07/06/2009   Glaucoma 07/06/2009   Benign essential HTN 07/06/2009   Hypercholesterolemia without hypertriglyceridemia 07/06/2009   Absence of bladder continence 07/06/2009   Family History  Problem Relation Age of Onset   Cancer Mother    Cancer Father    Social History   Tobacco Use   Smoking status: Current Every Day Smoker    Packs/day: 1.00    Years: 50.00    Pack years: 50.00    Types: Cigarettes   Smokeless tobacco: Never Used  Substance Use Topics   Alcohol use: Yes    Alcohol/week: 35.0 standard drinks    Types: 35 Cans of beer per week    Comment: 4-5 beers every day   Allergies  Allergen Reactions   Codeine Itching   Cyclobenzaprine Itching   Penicillins     itching   Current Meds  Medication Sig    albuterol (PROVENTIL) (2.5 MG/3ML) 0.083% nebulizer solution Take 3 mLs (2.5 mg total) by nebulization every 6 (six) hours as needed for wheezing or shortness of breath.   aspirin 325 MG tablet Take 1 tablet (325 mg total) by mouth daily.   atorvastatin (LIPITOR) 40 MG tablet Take 1 tablet (40 mg total) by mouth daily.   diltiazem (CARDIZEM) 120 MG tablet Take 1 tablet (120 mg total) by mouth daily.   dorzolamide (TRUSOPT) 2 % ophthalmic solution Place 1 drop into both eyes 3 (three) times daily. (Patient taking differently: Place 1 drop into both eyes 2 (two) times daily. )   Fluticasone-Salmeterol (ADVAIR DISKUS) 250-50 MCG/DOSE AEPB USE 1 INHALATION TWICE A DAY   furosemide (LASIX) 20 MG tablet TAKE 1 TABLET DAILY   HYDROcodone-acetaminophen (NORCO) 7.5-325 MG tablet Take 1 tablet by mouth every 6 (six) hours as needed for severe pain. Must last 30 days   latanoprost (XALATAN) 0.005 % ophthalmic solution Place 1 drop into both eyes at bedtime.   magnesium gluconate (MAGONATE) 500 MG tablet Take 500 mg by  mouth daily.   meloxicam (MOBIC) 15 MG tablet TAKE 1 TABLET DAILY   Multiple Vitamin (MULTI-VITAMIN) tablet Take 1 tablet by mouth daily.    Multiple Vitamins-Minerals (VITAMIN D3 COMPLETE PO) Take by mouth.   pregabalin (LYRICA) 150 MG capsule Take 1 capsule (150 mg total) by mouth every 8 (eight) hours.   ramipril (ALTACE) 10 MG capsule TAKE 1 CAPSULE DAILY   rOPINIRole (REQUIP) 2 MG tablet Take 1 tablet (2 mg total) by mouth 2 (two) times daily.   spironolactone (ALDACTONE) 25 MG tablet Take 1 tablet (25 mg total) by mouth daily.   vitamin B-12 (CYANOCOBALAMIN) 500 MCG tablet Take 1 tablet (500 mcg total) by mouth daily.   She is supposed to be taking Prilosec 40 mg daily however, is taking this only as needed.   Immunization History  Administered Date(s) Administered   Fluad Quad(high Dose 65+) 08/02/2019, 08/07/2020   Influenza Split 07/14/2010, 08/16/2011    Influenza, High Dose Seasonal PF 09/02/2017, 09/27/2018   Influenza,inj,Quad PF,6+ Mos 07/09/2014   Moderna SARS-COVID-2 Vaccination 01/02/2020, 01/30/2020   Pneumococcal Conjugate-13 01/09/2014   Pneumococcal Polysaccharide-23 12/01/2009, 07/24/2016   Zoster 05/23/2014   Zoster Recombinat (Shingrix) 09/02/2017, 01/09/2019, 08/02/2019      Objective:   Physical Exam BP 135/63 (Patient Position: Sitting)    Pulse 92    Temp 99.7 F (37.6 C)    Resp 16    Ht 4\' 11"  (1.499 m)    Wt 92 lb 6 oz (41.9 kg)    SpO2 98% Comment: RA   BMI 18.66 kg/m  GENERAL: Small statured woman, no acute distress.  Ambulates with assistance of a cane. HEAD: Normocephalic, atraumatic.  EYES: Pupils equal, round, reactive to light.  No scleral icterus.  MOUTH: Nose/mouth/throat not examined due to masking requirements for COVID 19. NECK: Supple. No thyromegaly. Trachea midline. No JVD.  No adenopathy. PULMONARY: Good air entry bilaterally.  There are rhonchi and end expiratory wheezes throughout, air movement appear somewhat diminished. CARDIOVASCULAR: S1 and S2. Regular rate and rhythm.  No rubs, murmurs or gallops heard. ABDOMEN: Benign. 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  NEUROLOGIC: No overt focal deficit.  Gait assisted by cane.  Speech is fluent SKIN: Intact,warm,dry. PSYCH: Mood and behavior normal.  Representative images of CT scan and PET/CT performed previously, independently reviewed:             Assessment & Plan:     ICD-10-CM   1. COPD suggested by initial evaluation (HCC)  J44.9 Pulmonary Function Test [facility] Only   Suspect class III COPD PFTs ordered Trial of Breztri 2 puffs twice a day Discontinue Advair  2. Abscess of upper lobe of right lung without pneumonia (HCC)  J85.2    Suspect findings on CT/PET CT are related to lung abscess Patient PCN allergic, treat with Avelox 400 mg  daily Probiotic while on Avelox Anti-reflux measures  3. Dysphagia, unspecified type  R13.10    Patient to be evaluated by GI PET/CT showed uptake in esophagus Query esophagitis versus other esophageal pathology Prilosec 40 mg daily  4. Gastroesophageal reflux disease, unspecified whether esophagitis present  K21.9 omeprazole (PRILOSEC) 40 MG capsule   Prilosec 40 mg daily Patient to be evaluated with EGD by GI  5. Tobacco dependence due to cigarettes  F17.210    Patient counseled regards to discontinuation of smoking Total counseling time 3 to 5 minutes  6. Preop pulmonary/respiratory exam  Z01.811  Moderate risk for complications for EGD Plan to optimize as above Should do well with monitored anesthesia care   Orders Placed This Encounter  Procedures   Pulmonary Function Test [facility] Only    Standing Status:   Future    Standing Expiration Date:   09/04/2021    Scheduling Instructions:     URGENT    Order Specific Question:   Full PFT: includes the following: basic spirometry, spirometry pre & post bronchodilator, diffusion capacity (DLCO), lung volumes    Answer:   Full PFT   Meds ordered this encounter  Medications   moxifloxacin (AVELOX) 400 MG tablet    Sig: Take 1 tablet (400 mg total) by mouth daily at 8 pm.    Dispense:  10 tablet    Refill:  0   Lactobacillus-Inulin (CULTURELLE ULTIMATE STRENGTH) CAPS    Sig: Take 1 capsule by mouth daily.    Dispense:  20 capsule    Refill:  2   Budeson-Glycopyrrol-Formoterol (BREZTRI AEROSPHERE) 160-9-4.8 MCG/ACT AERO    Sig: Inhale 2 puffs into the lungs in the morning and at bedtime for 1 day.    Dispense:  5.9 g    Refill:  0    Order Specific Question:   Lot Number?    Answer:   [id number]    Order Specific Question:   Expiration Date?    Answer:   03/24/2022    Order Specific Question:   Manufacturer?    Answer:   [employer] [[id number]]   omeprazole (PRILOSEC) 40 MG capsule    Sig: Take 1 capsule (40 mg total) by mouth  daily.    Dispense:  90 capsule    Refill:  3   Discussion:  Suspect that patient's main process is that of a lung abscess.  She has clear signs of recent aspiration.  She has had significant dysphagia.  She is to be evaluated by GI.  From my initial impression and evaluation of this patient she is a moderate risk for potential complications from EGD but should do better with monitored anesthesia care.  We are optimizing her lung function with Breztri 2 puffs twice a day this is a combination of LABA/ICS/LAMA.  Patient was provided samples as well as instructions on how to use the inhaler correctly.  She is to discontinue use of Advair.  She was not using PPI daily so she was instructed to take Prilosec 40 mg daily, she has been prescribed that previously and has the medication on hand.  For her potential lung abscess, she is penicillin allergic, will use Avelox 400 mg daily.  We have also recommended that she take Culturelle ultimate strength, 1 capsule daily while on antibiotics and for at least a week afterwards.  We are going to obtain pulmonary function testing to assess her lung capacity as she may need bronchoscopic intervention in the future if her lung findings fail to respond to antibiotics.  We will see her in follow-up in 2 to 3 weeks time we will call her with the appointment time.  We will determine about reimaging at the time of follow-up.  Total visit time 60 minutes.  Gailen Shelter, MD [facility]   *This note was dictated using voice recognition software/Dragon.  Despite best efforts to proofread, errors can occur which can change the meaning.  Any change was purely unintentional.

## 2020-09-04 NOTE — Progress Notes (Signed)
Tumor Board Documentation  Suzanne Avila was presented by Geraldine Contras, RN at our Tumor Board on 09/03/2020, which included representatives from medical oncology, internal medicine, navigation, radiation oncology, pathology, radiology, surgical, pharmacy, genetics, research, palliative care, pulmonology.  Suzanne Avila currently presents as a new patient, for discussion with history of the following treatments: active survellience.  Additionally, we reviewed previous medical and familial history, history of present illness, and recent lab results along with all available histopathologic and imaging studies. The tumor board considered available treatment options and made the following recommendations: Biopsy (Dr Jayme Cloud for Brochoscopic biopy)    The following procedures/referrals were also placed: No orders of the defined types were placed in this encounter.   Clinical Trial Status: not discussed   Staging used: Not Applicable  National site-specific guidelines   were discussed with respect to the case.  Tumor board is a meeting of clinicians from various specialty areas who evaluate and discuss patients for whom a multidisciplinary approach is being considered. Final determinations in the plan of care are those of the provider(s). The responsibility for follow up of recommendations given during tumor board is that of the provider.   Today's extended care, comprehensive team conference, Suzanne Avila was not present for the discussion and was not examined.   Multidisciplinary Tumor Board is a multidisciplinary case peer review process.  Decisions discussed in the Multidisciplinary Tumor Board reflect the opinions of the specialists present at the conference without having examined the patient.  Ultimately, treatment and diagnostic decisions rest with the primary provider(s) and the patient.

## 2020-09-04 NOTE — Telephone Encounter (Signed)
Has a lung abscess allergic to penicillin.  The alternative medication is Avelox.  Maybe we could try different pharmacy close by to the patient.

## 2020-09-04 NOTE — Progress Notes (Signed)
  Oncology Nurse Navigator Documentation  Navigator Location: [facility] (09/04/20 1200)   )Navigator Encounter Type: Clinic/[facility] (09/04/20 1200)               Multidisiplinary Clinic Date: 09/04/20 (09/04/20 1200) Multidisiplinary Clinic Type: Thoracic (09/04/20 1200)   Patient Visit Type: MedOnc (Pulmonary) (09/04/20 1200)   Barriers/Navigation Needs: Coordination of Care (09/04/20 1200)   Interventions: Coordination of Care;Referrals (09/04/20 1200) Referrals:  (GI) (09/04/20 1200) Coordination of Care: Appts (09/04/20 1200)        Acuity: Level 2-Minimal Needs (1-2 Barriers Identified) (09/04/20 1200)    met with patient and her son during visit in the thoracic multidisciplinary clinic with Dr. Janese Banks and Dr. Patsey Berthold. All questions answered during visit. Contact info given and instructed to call with any further questions or needs. Pt verbalized understanding.      Time Spent with Patient: 60 (09/04/20 1200)

## 2020-09-04 NOTE — Patient Instructions (Addendum)
We are giving you samples of Breztri, a new inhaler, 2 puffs twice a day.  DO NOT TAKE ADVAIR WHILE USING THE BREZTRI.  We have sent in for an antibiotic called Avelox (moxifloxacin) is 1 tablet daily.  Take it in the evening before you go to bed.  You should have enough for 10 days.  There is a tendency for antibiotics to sometimes cause issues with diarrhea.  I recommend that you take Culturelle, Ultimate Strength, 1 capsule daily while you are on the antibiotics and for a few days afterwards.  You have been instructed to take your Prilosec (omeprazole) every day.  We are scheduling you for breathing test.  We would like to see you back in 2 to 3 weeks time, we will call you with the appointment when it is made.  Your oxygen level today did very well.

## 2020-09-04 NOTE — Progress Notes (Signed)
Pt new pt for lung mass, current smoker and lives with her husband and grandson. Chronic back pain and sees pain management

## 2020-09-06 ENCOUNTER — Encounter: Payer: Self-pay | Admitting: Oncology

## 2020-09-06 NOTE — Progress Notes (Signed)
Hematology/Oncology Consult note [facility] [phone] [phone]  Patient Care Team: Margaretann Loveless, PA-C as PCP - General (Family Medicine) Wyn Quaker Marlow Baars, MD as Referring Physician (Vascular Surgery) Delano Metz, MD as Referring Physician (Pain Medicine) Lockie Mola, MD as Referring Physician (Ophthalmology) Glory Buff, RN as Oncology Nurse Navigator   Name of the patient: Suzanne Avila  [id number]  December 22, 1945    Reason for referral-lung mass   Referring physician-Jennifer Byrnett PA  Date of visit: 09/06/20   History of presenting illness- Patient is a 74 year old female with a past medical history significant for tobacco dependence who underwent a lung cancer screening CT on 08/20/2020 which showed a part solid mass with central cavitation measuring 3.5 cm with a solid component of 2.5 cm which was previously 6.6 mm a year ago.  This was followed by a PET CT scan which showed a masslike consolidation in the right upper lobe measuring 5.1 x 4.4 cm significant increase in size from 2.7 cm on recent CT in October.  Hypermetabolic at 8.3.  Hypermetabolism in the right hilum with an SUV of 3.7.  Hypermetabolic activity throughout the thoracic esophagus consistent with esophagitis.  Patient mainly reports difficulty swallowing food as well as pills.  She reports a constant globus sensation on swallowing.  She has been smoking 1 pack of cigarettes per day for over 50 years and is not interested in quitting.  She has been taking Prilosec on an as-needed basis.  Reports baseline fatigue and exertional shortness of breath.  She does not use any home oxygen  ECOG PS- 1  Pain scale- 3   Review of systems- Review of Systems  Constitutional: Positive for malaise/fatigue. Negative for chills, fever and weight loss.  HENT: Negative for congestion, ear discharge and nosebleeds.   Eyes: Negative for blurred vision.    Respiratory: Negative for cough, hemoptysis, sputum production, shortness of breath and wheezing.   Cardiovascular: Negative for chest pain, palpitations, orthopnea and claudication.  Gastrointestinal: Negative for abdominal pain, blood in stool, constipation, diarrhea, heartburn, melena, nausea and vomiting.       Dysphagia  Genitourinary: Negative for dysuria, flank pain, frequency, hematuria and urgency.  Musculoskeletal: Positive for back pain. Negative for joint pain and myalgias.  Skin: Negative for rash.  Neurological: Negative for dizziness, tingling, focal weakness, seizures, weakness and headaches.  Endo/Heme/Allergies: Does not bruise/bleed easily.  Psychiatric/Behavioral: Negative for depression and suicidal ideas. The patient does not have insomnia.     Allergies  Allergen Reactions  . Codeine Itching  . Cyclobenzaprine Itching  . Penicillins     itching    Patient Active Problem List   Diagnosis Date Noted  . Pharmacologic therapy 05/05/2020  . Chronic diarrhea 05/04/2020  . DDD (degenerative disc disease), lumbosacral 11/19/2019  . Therapeutic opioid-induced constipation (OIC) 08/01/2019  . Chronic sacroiliac joint pain (Left) 05/01/2019  . Chronic groin pain (Left) 05/01/2019  . Spondylosis without myelopathy or radiculopathy, lumbosacral region 05/01/2019  . Other specified dorsopathies, sacral and sacrococcygeal region 05/01/2019  . Renal artery stenosis (HCC) 12/27/2017  . Pain in limb 12/22/2017  . Tobacco use disorder 12/22/2017  . Renal atrophy, right 12/22/2017  . Thoracic radiculitis 01/31/2017  . Chronic lower extremity pain (Primary Area of Pain) (Bilateral) (L>R) 04/25/2016  . Chronic upper extremity pain (Third area of Pain) (Bilateral) (L>R) 04/25/2016  . Disturbance of skin sensation 04/20/2016  . Neurogenic pain 04/20/2016  . Chronic hip pain (Bilateral) (L>R) 11/11/2015  . Vitamin  D deficiency 11/11/2015  . Edema 09/02/2015  . 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 Encounter for therapeutic drug level monitoring 08/14/2015  . Long term current use of opiate analgesic 08/14/2015  . Long term prescription opiate use 08/14/2015  . Uncomplicated opioid dependence (HCC) 08/14/2015  . Opiate use (30 MME/Day) 08/14/2015  . Chronic low back pain (Secondary area of Pain) (Bilateral) (L>R) 08/14/2015  . Chronic pain syndrome 08/14/2015  . Alcoholic (HCC) 08/14/2015  . B12 deficiency 08/14/2015  . Narrowing of intervertebral disc space 08/14/2015  . Encounter for screening for diabetes mellitus 08/14/2015  . History of metabolic disorder 08/14/2015  . Fungal infection of toenail 08/14/2015  . Osteopenia 08/14/2015  . Compulsive tobacco user syndrome 08/14/2015  . Osteoarthritis of hip (Left) 08/14/2015  . Chronic hip pain (Left) 08/14/2015  . Chronic lumbar radicular pain (L5 dermatome) (Bilateral) (L>R) 08/14/2015  . Lumbar facet syndrome (Bilateral) (L>R) 08/14/2015  . Lumbar spondylosis 08/14/2015  . 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 Lumbar Postlaminectomy syndrome (L4-5) 08/14/2015  . Fibromyalgia 08/14/2015  . Chronic airway obstruction (HCC) 04/21/2015  . Allergic rhinitis 02/10/2010  . Atrial paroxysmal tachycardia (HCC) 07/06/2009  . CAFL (chronic airflow limitation) (HCC) 07/06/2009  . Acid reflux 07/06/2009  . Glaucoma 07/06/2009  . Benign essential HTN 07/06/2009  . Hypercholesterolemia without hypertriglyceridemia 07/06/2009  . Absence of bladder continence 07/06/2009     Past Medical History:  Diagnosis Date  . Closed left arm fracture   . COPD (chronic obstructive pulmonary disease) (HCC)   . Glaucoma   . Hyperlipidemia   . Hypertension   . Lung mass      Past Surgical History:  Procedure Laterality Date  . BACK SURGERY    . CARPAL TUNNEL RELEASE Left   . EYE SURGERY      Social History   Socioeconomic History  . Marital status: Married    Spouse  name: Not on file  . Number of children: 2  . Years of education: Not on file  . Highest education level: 12th grade  Occupational History  . Occupation: disabled  . Occupation: retired  Tobacco Use  . Smoking status: Current Every Day Smoker    Packs/day: 1.00    Years: 50.00    Pack years: 50.00    Types: Cigarettes  . Smokeless tobacco: Never Used  Vaping Use  . Vaping Use: Former  Substance and Sexual Activity  . Alcohol use: Yes    Alcohol/week: 35.0 standard drinks    Types: 35 Cans of beer per week    Comment: 4-5 beers every day  . Drug use: No  . Sexual activity: Not Currently  Other Topics Concern  . Not on file  Social History Narrative  . Not on file   Social Determinants of Health   Financial Resource Strain: Low Risk   . Difficulty of Paying Living Expenses: Not hard at all  Food Insecurity: No Food Insecurity  . Worried About Programme researcher, broadcasting/film/videounning Out of Food in the Last Year: Never true  . Ran Out of Food in the Last Year: Never true  Transportation Needs: No Transportation Needs  . Lack of Transportation (Medical): No  . Lack of Transportation (Non-Medical): No  Physical Activity: Inactive  . Days of Exercise per Week: 0 days  . Minutes of Exercise per Session: 0 min  Stress: No Stress Concern Present  . Feeling of Stress : Only a little  Social Connections: Moderately Isolated  .  Frequency of Communication with Friends and Family: Three times a week  . Frequency of Social Gatherings with Friends and Family: Once a week  . Attends Religious Services: Never  . Active Member of Clubs or Organizations: No  . Attends Banker Meetings: Never  . Marital Status: Married  Catering manager Violence: Not At Risk  . Fear of Current or Ex-Partner: No  . Emotionally Abused: No  . Physically Abused: No  . Sexually Abused: No     Family History  Problem Relation Age of Onset  . Cancer Mother   . Cancer Father      Current Outpatient Medications:  .   albuterol (PROVENTIL) (2.5 MG/3ML) 0.083% nebulizer solution, Take 3 mLs (2.5 mg total) by nebulization every 6 (six) hours as needed for wheezing or shortness of breath., Disp: 150 mL, Rfl: 1 .  aspirin 325 MG tablet, Take 1 tablet (325 mg total) by mouth daily., Disp: 90 tablet, Rfl: 3 .  atorvastatin (LIPITOR) 40 MG tablet, Take 1 tablet (40 mg total) by mouth daily., Disp: 90 tablet, Rfl: 0 .  diltiazem (CARDIZEM) 120 MG tablet, Take 1 tablet (120 mg total) by mouth daily., Disp: 90 tablet, Rfl: 0 .  dorzolamide (TRUSOPT) 2 % ophthalmic solution, Place 1 drop into both eyes 3 (three) times daily. (Patient taking differently: Place 1 drop into both eyes 2 (two) times daily. ), Disp: 10 mL, Rfl: 3 .  Fluticasone-Salmeterol (ADVAIR DISKUS) 250-50 MCG/DOSE AEPB, USE 1 INHALATION TWICE A DAY, Disp: 180 each, Rfl: 3 .  furosemide (LASIX) 20 MG tablet, TAKE 1 TABLET DAILY, Disp: 90 tablet, Rfl: 1 .  HYDROcodone-acetaminophen (NORCO) 7.5-325 MG tablet, Take 1 tablet by mouth every 6 (six) hours as needed for severe pain. Must last 30 days, Disp: 120 tablet, Rfl: 0 .  latanoprost (XALATAN) 0.005 % ophthalmic solution, Place 1 drop into both eyes at bedtime., Disp: 2.5 mL, Rfl: 3 .  magnesium gluconate (MAGONATE) 500 MG tablet, Take 500 mg by mouth daily., Disp: , Rfl:  .  meloxicam (MOBIC) 15 MG tablet, TAKE 1 TABLET DAILY, Disp: 90 tablet, Rfl: 1 .  Multiple Vitamin (MULTI-VITAMIN) tablet, Take 1 tablet by mouth daily. , Disp: , Rfl:  .  Multiple Vitamins-Minerals (VITAMIN D3 COMPLETE PO), Take by mouth., Disp: , Rfl:  .  pregabalin (LYRICA) 150 MG capsule, Take 1 capsule (150 mg total) by mouth every 8 (eight) hours., Disp: 270 capsule, Rfl: 1 .  ramipril (ALTACE) 10 MG capsule, TAKE 1 CAPSULE DAILY, Disp: 30 capsule, Rfl: 0 .  rOPINIRole (REQUIP) 2 MG tablet, Take 1 tablet (2 mg total) by mouth 2 (two) times daily., Disp: 180 tablet, Rfl: 2 .  spironolactone (ALDACTONE) 25 MG tablet, Take 1 tablet (25  mg total) by mouth daily., Disp: 90 tablet, Rfl: 0 .  vitamin B-12 (CYANOCOBALAMIN) 500 MCG tablet, Take 1 tablet (500 mcg total) by mouth daily., Disp: 90 tablet, Rfl: 3 .  HYDROcodone-acetaminophen (NORCO) 7.5-325 MG tablet, Take 1 tablet by mouth every 6 (six) hours as needed for severe pain. Must last 30 days (Patient not taking: Reported on 09/04/2020), Disp: 120 tablet, Rfl: 0 .  [START ON 10/05/2020] HYDROcodone-acetaminophen (NORCO) 7.5-325 MG tablet, Take 1 tablet by mouth every 6 (six) hours as needed for severe pain. Must last 30 days (Patient not taking: Reported on 09/04/2020), Disp: 120 tablet, Rfl: 0 .  Lactobacillus-Inulin (CULTURELLE ULTIMATE STRENGTH) CAPS, Take 1 capsule by mouth daily., Disp: 20 capsule, Rfl: 2 .  levofloxacin (LEVAQUIN) 500 MG tablet, Take 1 tablet (500 mg total) by mouth daily., Disp: 10 tablet, Rfl: 0 .  linaclotide (LINZESS) 145 MCG CAPS capsule, Take 1 capsule (145 mcg total) by mouth daily before breakfast. (Patient not taking: Reported on 09/04/2020), Disp: 90 capsule, Rfl: 3 .  metroNIDAZOLE (FLAGYL) 500 MG tablet, Take 1 tablet (500 mg total) by mouth 3 (three) times daily for 10 days., Disp: 30 tablet, Rfl: 0 .  moxifloxacin (AVELOX) 400 MG tablet, Take 1 tablet (400 mg total) by mouth daily at 8 pm., Disp: 10 tablet, Rfl: 0 .  omeprazole (PRILOSEC) 40 MG capsule, Take 1 capsule (40 mg total) by mouth daily., Disp: 90 capsule, Rfl: 3   Physical exam:  Vitals:   09/04/20 0927  BP: 135/63  Pulse: 92  Resp: 16  Temp: 99.7 F (37.6 C)  TempSrc: Tympanic  SpO2: 98%  Weight: 92 lb 6.4 oz (41.9 kg)  Height: 4\' 11"  (1.499 m)   Physical Exam Constitutional:      General: She is not in acute distress.    Comments: Appears fatigued  Cardiovascular:     Rate and Rhythm: Normal rate and regular rhythm.     Heart sounds: Normal heart sounds.  Pulmonary:     Effort: Pulmonary effort is normal.     Breath sounds: Normal breath sounds.  Abdominal:      General: Bowel sounds are normal.     Palpations: Abdomen is soft.  Skin:    General: Skin is warm and dry.  Neurological:     Mental Status: She is alert and oriented to person, place, and time.        CMP Latest Ref Rng & Units 02/03/2020  Glucose 65 - 99 mg/dL 96  BUN 8 - 27 mg/dL 12  Creatinine 04/04/2020 - 4.09 mg/dL 8.11)  Sodium 9.14(N - 829 mmol/L 137  Potassium 3.5 - 5.2 mmol/L 4.4  Chloride 96 - 106 mmol/L 100  CO2 20 - 29 mmol/L 21  Calcium 8.7 - 10.3 mg/dL 562  Total Protein 6.0 - 8.5 g/dL 6.5  Total Bilirubin 0.0 - 1.2 mg/dL 0.3  Alkaline Phos 39 - 117 IU/L 96  AST 0 - 40 IU/L 15  ALT 0 - 32 IU/L 10   CBC Latest Ref Rng & Units 02/03/2020  WBC 3.4 - 10.8 x10E3/uL 7.3  Hemoglobin 11.1 - 15.9 g/dL 04/04/2020  Hematocrit 86.5 - 46.6 % 43.6  Platelets 150 - 450 x10E3/uL 301    No images are attached to the encounter.  NM PET Image Initial (PI) Skull Base To Thigh  Result Date: 09/02/2020 CLINICAL DATA:  Initial treatment strategy for right upper lobe pulmonary nodule. EXAM: NUCLEAR MEDICINE PET SKULL BASE TO THIGH TECHNIQUE: 5.8 mCi F-18 FDG was injected intravenously. Full-ring PET imaging was performed from the skull base to thigh after the radiotracer. CT data was obtained and used for attenuation correction and anatomic localization. Fasting blood glucose: 84 mg/dl COMPARISON:  CT on 13/07/2020 FINDINGS: Mediastinal blood-pool activity (background): SUV max = 2.3 Liver activity (reference): SUV max = N/A NECK:  No hypermetabolic lymph nodes or masses. Incidental CT findings:  None. CHEST: Rounded masslike consolidation is seen in the posterolateral right upper lobe, with central cavitation. This measures 5.1 x 4.4 cm on image 76/3, with significant increase in size from 2.7 x 2.1 cm on recent CT. This shows hypermetabolic activity peripherally, with SUV max of 8.3. No other suspicious pulmonary nodules or masses identified. No evidence  of pleural effusion. Increased metabolic  activity is seen in the right hilum, with SUV max of 3.7. No other hypermetabolic lymph nodes identified. Hypermetabolic activity is seen throughout the thoracic esophagus consistent with esophagitis. Incidental CT findings: Aortic and coronary atherosclerotic calcification noted. ABDOMEN/PELVIS: No abnormal hypermetabolic activity within the liver, pancreas, adrenal glands, or spleen. No hypermetabolic lymph nodes in the abdomen or pelvis. Incidental CT findings: Severe diffuse right renal atrophy. Aortic atherosclerotic calcification noted. SKELETON: No focal hypermetabolic bone lesions to suggest skeletal metastasis. Incidental CT findings: Lumbar spine fusion hardware. 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 IMPRESSION: 5 cm hypermetabolic masslike consolidation with central cavitation in posterior right upper lobe shows significant increase in size from recent CT on 08/20/2020. This raises possibility of cavitary pneumonia, although necrotic bronchogenic carcinoma cannot be excluded. Mild hypermetabolic activity in right hilum. Differential diagnosis includes reactive lymphadenopathy and metastatic disease. Diffuse hypermetabolic activity throughout the thoracic esophagus, consistent with esophagitis. No evidence of malignancy in the neck, abdomen, or pelvis. Electronically Signed   By: Danae Orleans M.D.   On: 09/02/2020 11:21   DG PAIN CLINIC C-ARM 1-60 MIN NO REPORT  Result Date: 08/25/2020 Fluoro was used, but no Radiologist interpretation will be provided. Please refer to "NOTES" tab for provider progress note.  CT CHEST LUNG CANCER SCREENING LOW DOSE WO CONTRAST  Result Date: 08/20/2020 CLINICAL DATA:  Lung cancer screening. Current smoker with 67 pack-year history. Asymptomatic. EXAM: CT CHEST WITHOUT CONTRAST LOW-DOSE FOR LUNG CANCER SCREENING TECHNIQUE: Multidetector CT imaging of the chest was performed following the standard protocol without IV contrast. COMPARISON:   07/19/2019 FINDINGS: Cardiovascular: The heart size appears within normal limits. No pericardial effusion. Aortic atherosclerosis. Coronary artery calcifications. Mediastinum/Nodes: Normal appearance of the thyroid gland. The trachea appears patent and is midline. No enlarged mediastinal, hilar, axillary, or supraclavicular adenopathy. Lungs/Pleura: Mild to moderate changes of centrilobular emphysema. Within the posterior lateral right upper lobe, in the area of previously noted 6.6 mm part solid nodule, there is large part-solid mass with central cavitation. This measures 35.9 mm with central solid component measuring 25.4 mm. Cavitation is identified within the central portions of the solid component. Upper Abdomen: No acute abnormality. Asymmetric right renal atrophy. Aortic atherosclerosis. Musculoskeletal: No chest wall mass or suspicious bone lesions identified. IMPRESSION: 1. Lung-RADS 4B, suspicious. Additional imaging evaluation or consultation with Pulmonology or Thoracic Surgery recommended. 2. Coronary artery calcifications. Aortic Atherosclerosis (ICD10-I70.0) and Emphysema (ICD10-J43.9). These results will be called to the ordering clinician or representative by the Radiologist Assistant, and communication documented in the PACS or [employer]. Electronically Signed   By: Signa Kell M.D.   On: 08/20/2020 15:47    Assessment and plan- Patient is a 74 y.o. female with new diagnosis of right upper lobe cavitary mass possible hilar adenopathy and thoracic esophagitis  1.  Right upper lobe cavitary mass: I have reviewed PET/CT scan images independently and discussed findings with the patient and her son.  There appears to be significant increase in the size of the mass in the last 1 month and therefore it is unclear if this mass represents malignancy versus infection.  Pulmonary abscess is also a possibility.  Patient was evaluated by Dr. Jayme Cloud. Today and plan is to optimize her respiratory  status as well as antibiotic treatment for potential lung abscess.  Plan is to reimage her after her potential infection is treated followed by consideration for bronchoscopy.  Dysphagia: Patient reports significant dysphagia even to swallowing pills.  There  was concern for thoracic esophagitis on PET scan.  I have spoken to Dr. Tobi Bastos who was on-call for GI today to get her seen urgently and evaluate her for possible EGD.  We have asked her to take her Prilosec consistently 40 mg daily and not on an as-needed basis in the interim  Follow-up with me will be based on potential bronchoscopy in the future if this turns out to be malignancy   Thank you for this kind referral and the opportunity to participate in the care of this patient   Visit Diagnosis 1. Cavitating mass in right upper lung lobe   2. Esophagitis     Dr. Owens Shark, MD, MPH [facility] [phone] 09/06/2020  9:25 PM

## 2020-09-09 ENCOUNTER — Telehealth: Payer: Self-pay | Admitting: Gastroenterology

## 2020-09-09 ENCOUNTER — Telehealth: Payer: Self-pay

## 2020-09-09 ENCOUNTER — Encounter: Payer: Self-pay | Admitting: Pain Medicine

## 2020-09-09 NOTE — Telephone Encounter (Signed)
LM for patient to call office for pere virtual appointment questions.  °

## 2020-09-09 NOTE — Telephone Encounter (Signed)
-----   Message from Rayann Heman, CMA sent at 09/04/2020 10:40 AM EST ----- This is the patient Dr. Tobi Bastos and Dr. Smith Robert wanted to see this week. Per Dr. Tobi Bastos, please double book her anywhere on his schedule. Reason: GERD.  Thank you!!!

## 2020-09-09 NOTE — Telephone Encounter (Signed)
Left voicemail for patient to call office back and confirm scheduled appt with Dr. Tobi Bastos for this Thur 11.18.21 at 1:45pm. Lorain Childes

## 2020-09-10 ENCOUNTER — Encounter: Payer: Self-pay | Admitting: Gastroenterology

## 2020-09-10 ENCOUNTER — Ambulatory Visit (INDEPENDENT_AMBULATORY_CARE_PROVIDER_SITE_OTHER): Payer: Medicare Other | Admitting: Gastroenterology

## 2020-09-10 ENCOUNTER — Other Ambulatory Visit: Payer: Self-pay

## 2020-09-10 ENCOUNTER — Ambulatory Visit: Payer: Medicare Other | Attending: Pain Medicine | Admitting: Pain Medicine

## 2020-09-10 DIAGNOSIS — Z5329 Procedure and treatment not carried out because of patient's decision for other reasons: Secondary | ICD-10-CM

## 2020-09-10 DIAGNOSIS — M961 Postlaminectomy syndrome, not elsewhere classified: Secondary | ICD-10-CM

## 2020-09-10 DIAGNOSIS — M533 Sacrococcygeal disorders, not elsewhere classified: Secondary | ICD-10-CM

## 2020-09-10 DIAGNOSIS — M47816 Spondylosis without myelopathy or radiculopathy, lumbar region: Secondary | ICD-10-CM | POA: Diagnosis not present

## 2020-09-10 DIAGNOSIS — G8929 Other chronic pain: Secondary | ICD-10-CM | POA: Insufficient documentation

## 2020-09-10 DIAGNOSIS — M545 Low back pain, unspecified: Secondary | ICD-10-CM

## 2020-09-10 DIAGNOSIS — G894 Chronic pain syndrome: Secondary | ICD-10-CM

## 2020-09-10 DIAGNOSIS — Z79899 Other long term (current) drug therapy: Secondary | ICD-10-CM

## 2020-09-10 NOTE — Progress Notes (Signed)
Patient: Suzanne Avila  Service Category: E/M  Provider: Gaspar Cola, MD  DOB: September 13, 1946  DOS: 09/10/2020  Location: Office  MRN: [id number]  Setting: Ambulatory outpatient  Referring Provider: Florian Buff*  Type: Established Patient  Specialty: Interventional Pain Management  PCP: Mar Daring, PA-C  Location: Remote location  Delivery: TeleHealth     Virtual Encounter - Pain Management PROVIDER NOTE: Information contained herein reflects review and annotations entered in association with encounter. Interpretation of such information and data should be left to medically-trained personnel. Information provided to patient can be located elsewhere in the medical record under "Patient Instructions". Document created using STT-dictation technology, any transcriptional errors that may result from process are unintentional.    Contact & Pharmacy Preferred: [phone] Home: [phone] (home) Mobile: [phone] (mobile) E-mail: No e-mail address on record  [facility], [place] [facility] [place] [facility] [place] [address] Phone: [phone] Fax: [place], [place] [address] [place] [address] Phone: [phone] Fax: [phone]   Pre-screening  Ms. Loughmiller offered "in-person" vs "virtual" encounter. She indicated preferring virtual for this encounter.   Reason COVID-19*  Social distancing based on CDC and AMA recommendations.   I contacted Lamonte Sakai on 09/10/2020 via telephone.      I clearly identified myself as Gaspar Cola, MD. I verified that I was speaking with the correct person using two identifiers (Name: CYNDY BRAVER, and date of birth: 03-27-1946).  Consent I sought verbal advanced consent from Lamonte Sakai for virtual visit interactions. I informed Ms. Colton of possible security and privacy concerns, risks,  and limitations associated with providing "not-in-person" medical evaluation and management services. I also informed Ms. Bless of the availability of "in-person" appointments. Finally, I informed her that there would be a charge for the virtual visit and that she could be  personally, fully or partially, financially responsible for it. Ms. Mull expressed understanding and agreed to proceed.   Historic Elements   Ms. GABRIELLE WAKELAND is a 74 y.o. year old, female patient evaluated today after our last contact on 08/25/2020. Ms. Hocutt  has a past medical history of Closed left arm fracture, COPD (chronic obstructive pulmonary disease) ([place]), Glaucoma, Hyperlipidemia, Hypertension, and Lung mass. She also  has a past surgical history that includes Back surgery; Eye surgery; and Carpal tunnel release (Left). Ms. Fonseca has a current medication list which includes the following prescription(s): albuterol, aspirin, atorvastatin, diltiazem, dorzolamide, fluticasone-salmeterol, furosemide, hydrocodone-acetaminophen, hydrocodone-acetaminophen, [START ON 10/05/2020] hydrocodone-acetaminophen, culturelle ultimate strength, latanoprost, levofloxacin, linaclotide, magnesium gluconate, meloxicam, metronidazole, moxifloxacin, multi-vitamin, multiple vitamins-minerals, omeprazole, pregabalin, ramipril, ropinirole, spironolactone, and vitamin b-12. She  reports that she has been smoking cigarettes. She has a 50.00 pack-year smoking history. She has never used smokeless tobacco. She reports current alcohol use of about 35.0 standard drinks of alcohol per week. She reports that she does not use drugs. Ms. Schrag is allergic to codeine, cyclobenzaprine, and penicillins.   HPI  Today, she is being contacted for a post-procedure assessment.  According to the patient's description, it would seem that this particular combination works well in controlling the pain as he describes having attained 100% relief of her low  back pain that seems to be ongoing.  However, because of her fusion hardware she is not a good candidate for radiofrequency ablation and she also does not enjoy significant range of motion.  We will continue to manage her pain with her medications and occasional palliative injections when she has a flareup.  For the time being she is doing okay and she does not seem to need anything else from [place].  Post-Procedure Evaluation  Procedure (08/25/2020): Palliative left lumbar facet block #3 + palliative left SI joint block #3 under fluoroscopic guidance and IV sedation Pre-procedure pain level: 3/10 Post-procedure: 0/10 (100% relief)  Sedation: Sedation provided.  Effectiveness during initial hour after procedure (Ultra-Short Term Relief):  100%.  Local anesthetic used: Long-acting (4-6 hours) Effectiveness: Defined as any analgesic benefit obtained secondary to the administration of local anesthetics. This carries significant diagnostic value as to the etiological location, or anatomical origin, of the pain. Duration of benefit is expected to coincide with the duration of the local anesthetic used.  Effectiveness during initial 4-6 hours after procedure (Short-Term Relief):  100%.  Long-term benefit: Defined as any relief past the pharmacologic duration of the local anesthetics.  Effectiveness past the initial 6 hours after procedure (Long-Term Relief):  100%.  Current benefits: Defined as benefit that persist at this time.   Analgesia:  90-100% better.  She describes that the sharp low back pain is completely gone. Function: Somewhat improved.  She indicates that she feels that she can do better but not for long.  If she stands for prolonged period time then she begins to experience a pressure sensation around the back of her waist.  However, she describes that the sharp pain is completely gone. ROM: Somewhat improved.  She does have some restriction in range of motion secondary to her  hardware.  Pharmacotherapy Assessment  Analgesic: Hydrocodone/APAP 7.5/325, 1 tab PO q 6 hrs (30 mg/day of hydrocodone) MME/day:30 mg/day.   Monitoring: [place] PMP: PDMP not reviewed this encounter.       Pharmacotherapy: No side-effects or adverse reactions reported. Compliance: No problems identified. Effectiveness: Clinically acceptable. Plan: Refer to "POC".  UDS:  Summary  Date Value Ref Range Status  05/06/2020 Note  Final    Comment:    ==================================================================== [employer] [facility] 13 (MW) ==================================================================== Specimen Alert Note: Urinary creatinine is low; ability to detect some drugs may be compromised. Interpret results with caution. (Creatinine) ==================================================================== Test                             Result       Flag       Units  Drug Present and Declared for Prescription Verification   Hydrocodone                    3460         EXPECTED   ng/mg creat   Hydromorphone                  1380         EXPECTED   ng/mg creat   Dihydrocodeine                 580          EXPECTED   ng/mg creat   Norhydrocodone                 2430         EXPECTED   ng/mg creat    Sources of hydrocodone include scheduled prescription medications.    Hydromorphone, dihydrocodeine and norhydrocodone are expected    metabolites of hydrocodone. Hydromorphone and dihydrocodeine are  also available as scheduled prescription medications.  ==================================================================== Test                      Result    Flag   Units      Ref Range   Creatinine              10        LL     mg/dL      >=20 ==================================================================== Declared Medications:  The flagging and interpretation on this report are based on the  following declared medications.  Unexpected results may arise from  inaccuracies  in the declared medications.   **Note: The testing scope of this panel includes these medications:   Hydrocodone   **Note: The testing scope of this panel does not include the  following reported medications:   Acetaminophen  Albuterol  Aspirin  Atorvastatin  Cyanocobalamin  Diltiazem  Dorzolamide  Fluticasone (Advair)  Furosemide (Lasix)  Latanoprost  Magnesium  Meloxicam  Multivitamin  Omeprazole  Pregabalin (Lyrica)  Ramipril  Ropinirole  Salmeterol (Advair)  Spironolactone ==================================================================== For clinical consultation, please call [phone]. ====================================================================     Laboratory Chemistry Profile   Renal Lab Results  Component Value Date   BUN 12 02/03/2020   CREATININE 1.04 (H) 02/03/2020   BCR 12 02/03/2020   GFRAA 61 02/03/2020   GFRNONAA 53 (L) 02/03/2020     Hepatic Lab Results  Component Value Date   AST 15 02/03/2020   ALT 10 02/03/2020   ALBUMIN 4.2 02/03/2020   ALKPHOS 96 02/03/2020     Electrolytes Lab Results  Component Value Date   NA 137 02/03/2020   K 4.4 02/03/2020   CL 100 02/03/2020   CALCIUM 10.1 02/03/2020   MG 1.7 02/14/2017     Bone Lab Results  Component Value Date   VD25OH 45.9 02/03/2020     Inflammation (CRP: Acute Phase) (ESR: Chronic Phase) Lab Results  Component Value Date   CRP <0.8 02/14/2017   ESRSEDRATE 20 02/14/2017       Note: Above Lab results reviewed.  Imaging  NM PET Image Initial (PI) Skull Base To Thigh CLINICAL DATA:  Initial treatment strategy for right upper lobe pulmonary nodule.  EXAM: NUCLEAR MEDICINE PET SKULL BASE TO THIGH  TECHNIQUE: 5.8 mCi F-18 FDG was injected intravenously. Full-ring PET imaging was performed from the skull base to thigh after the radiotracer. CT data was obtained and used for attenuation correction and anatomic localization.  Fasting blood glucose: 84  mg/dl  COMPARISON:  CT on 08/20/2020  FINDINGS: Mediastinal blood-pool activity (background): SUV max = 2.3  Liver activity (reference): SUV max = N/A  NECK:  No hypermetabolic lymph nodes or masses.  Incidental CT findings:  None.  CHEST: Rounded masslike consolidation is seen in the posterolateral right upper lobe, with central cavitation. This measures 5.1 x 4.4 cm on image 76/3, with significant increase in size from 2.7 x 2.1 cm on recent CT. This shows hypermetabolic activity peripherally, with SUV max of 8.3. No other suspicious pulmonary nodules or masses identified. No evidence of pleural effusion.  Increased metabolic activity is seen in the right hilum, with SUV max of 3.7. No other hypermetabolic lymph nodes identified. Hypermetabolic activity is seen throughout the thoracic esophagus consistent with esophagitis.  Incidental CT findings: Aortic and coronary atherosclerotic calcification noted.  ABDOMEN/PELVIS: No abnormal hypermetabolic activity within the liver, pancreas, adrenal glands, or spleen. No hypermetabolic lymph nodes in the abdomen or  pelvis.  Incidental CT findings: Severe diffuse right renal atrophy. Aortic atherosclerotic calcification noted.  SKELETON: No focal hypermetabolic bone lesions to suggest skeletal metastasis.  Incidental CT findings: Lumbar spine fusion hardware. 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  IMPRESSION: 5 cm hypermetabolic masslike consolidation with central cavitation in posterior right upper lobe shows significant increase in size from recent CT on 08/20/2020. This raises possibility of cavitary pneumonia, although necrotic bronchogenic carcinoma cannot be excluded.  Mild hypermetabolic activity in right hilum. Differential diagnosis includes reactive lymphadenopathy and metastatic disease.  Diffuse hypermetabolic activity throughout the thoracic esophagus, consistent with esophagitis.  No  evidence of malignancy in the neck, abdomen, or pelvis.  Electronically Signed   By: Marlaine Hind M.D.   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  Plan of Care  Problem-specific:  No problem-specific Assessment & Plan notes found for this encounter.  Ms. LARETTA PYATT has a current medication list which includes the following long-term medication(s): albuterol, atorvastatin, diltiazem, fluticasone-salmeterol, furosemide, hydrocodone-acetaminophen, hydrocodone-acetaminophen, [START ON 10/05/2020] hydrocodone-acetaminophen, linaclotide, meloxicam, omeprazole, pregabalin, ramipril, ropinirole, and spironolactone.  Pharmacotherapy (Medications Ordered): No orders of the defined types were placed in this encounter.  Orders:  No orders of the defined types were placed in this encounter.  Follow-up plan:   No follow-ups on file.      Considering:   Diagnostic bilateral IA hip joint injection  Possible bilateral hip joint RFA  Possible bilateral lumbar facet RFA  Possible left SI joint RFA  Diagnostic bilateral L4-L5 TFESI  Diagnostic caudal ESI  Diagnostic caudal epidurogram  Possible RACZ epidural lysis of adhesions  Diagnostic left L2-3 LESI  Possible bilateral Lumbar SCS trial implant    Palliative PRN treatment(s):   Diagnostic right lumbar facet block #2  Diagnostic left lumbar facet block #3  Diagnostic left SI joint block #3      Recent Visits Date Type Provider Dept  08/25/20 Procedure visit Milinda Pointer, MD [facility]  07/27/20 Office Visit Milinda Pointer, MD [facility]  Showing recent visits within past 90 days and meeting all other  requirements Today's Visits Date Type Provider Dept  09/10/20 Telemedicine Milinda Pointer, MD [facility]  Showing today's visits and meeting all other requirements Future Appointments Date Type Provider Dept  11/02/20 Appointment Milinda Pointer, MD [facility]  Showing future appointments within next 90 days and meeting all other requirements  I discussed the assessment and treatment plan with the patient. The patient was provided an opportunity to ask questions and all were answered. The patient agreed with the plan and demonstrated an understanding of the instructions.  Patient advised to call back or seek an in-person evaluation if the symptoms or condition worsens.  Duration of encounter: 15 minutes.  Note by: Gaspar Cola, MD Date: 09/10/2020; Time: 5:22 PM

## 2020-09-10 NOTE — Progress Notes (Signed)
No show

## 2020-09-14 ENCOUNTER — Emergency Department: Payer: Medicare Other

## 2020-09-14 ENCOUNTER — Encounter: Payer: Self-pay | Admitting: *Deleted

## 2020-09-14 ENCOUNTER — Telehealth: Payer: Medicare Other | Admitting: Pain Medicine

## 2020-09-14 ENCOUNTER — Other Ambulatory Visit: Payer: Self-pay

## 2020-09-14 ENCOUNTER — Inpatient Hospital Stay
Admission: EM | Admit: 2020-09-14 | Discharge: 2020-09-17 | DRG: 177 | Disposition: A | Discharge status: Home / Self Care | Payer: Medicare Other | Attending: Internal Medicine | Admitting: Internal Medicine

## 2020-09-14 ENCOUNTER — Telehealth: Payer: Self-pay | Admitting: Pulmonary Disease

## 2020-09-14 DIAGNOSIS — R55 Syncope and collapse: Secondary | ICD-10-CM | POA: Diagnosis present

## 2020-09-14 DIAGNOSIS — R54 Age-related physical debility: Secondary | ICD-10-CM | POA: Diagnosis present

## 2020-09-14 DIAGNOSIS — J851 Abscess of lung with pneumonia: Secondary | ICD-10-CM | POA: Diagnosis present

## 2020-09-14 DIAGNOSIS — J984 Other disorders of lung: Secondary | ICD-10-CM

## 2020-09-14 DIAGNOSIS — D72829 Elevated white blood cell count, unspecified: Secondary | ICD-10-CM

## 2020-09-14 DIAGNOSIS — F1010 Alcohol abuse, uncomplicated: Secondary | ICD-10-CM | POA: Diagnosis present

## 2020-09-14 DIAGNOSIS — H409 Unspecified glaucoma: Secondary | ICD-10-CM | POA: Diagnosis present

## 2020-09-14 DIAGNOSIS — I471 Supraventricular tachycardia: Secondary | ICD-10-CM | POA: Diagnosis present

## 2020-09-14 DIAGNOSIS — Z7951 Long term (current) use of inhaled steroids: Secondary | ICD-10-CM

## 2020-09-14 DIAGNOSIS — J189 Pneumonia, unspecified organism: Secondary | ICD-10-CM | POA: Diagnosis present

## 2020-09-14 DIAGNOSIS — K2100 Gastro-esophageal reflux disease with esophagitis, without bleeding: Secondary | ICD-10-CM | POA: Diagnosis present

## 2020-09-14 DIAGNOSIS — D649 Anemia, unspecified: Secondary | ICD-10-CM | POA: Diagnosis present

## 2020-09-14 DIAGNOSIS — E43 Unspecified severe protein-calorie malnutrition: Secondary | ICD-10-CM | POA: Diagnosis present

## 2020-09-14 DIAGNOSIS — Z888 Allergy status to other drugs, medicaments and biological substances status: Secondary | ICD-10-CM

## 2020-09-14 DIAGNOSIS — R627 Adult failure to thrive: Secondary | ICD-10-CM | POA: Diagnosis present

## 2020-09-14 DIAGNOSIS — R1310 Dysphagia, unspecified: Secondary | ICD-10-CM | POA: Diagnosis present

## 2020-09-14 DIAGNOSIS — B370 Candidal stomatitis: Secondary | ICD-10-CM | POA: Diagnosis present

## 2020-09-14 DIAGNOSIS — Z20822 Contact with and (suspected) exposure to covid-19: Secondary | ICD-10-CM | POA: Diagnosis present

## 2020-09-14 DIAGNOSIS — E861 Hypovolemia: Secondary | ICD-10-CM | POA: Diagnosis present

## 2020-09-14 DIAGNOSIS — J440 Chronic obstructive pulmonary disease with acute lower respiratory infection: Secondary | ICD-10-CM | POA: Diagnosis present

## 2020-09-14 DIAGNOSIS — I10 Essential (primary) hypertension: Secondary | ICD-10-CM | POA: Diagnosis present

## 2020-09-14 DIAGNOSIS — R1319 Other dysphagia: Secondary | ICD-10-CM | POA: Diagnosis not present

## 2020-09-14 DIAGNOSIS — Z7982 Long term (current) use of aspirin: Secondary | ICD-10-CM

## 2020-09-14 DIAGNOSIS — E785 Hyperlipidemia, unspecified: Secondary | ICD-10-CM | POA: Diagnosis present

## 2020-09-14 DIAGNOSIS — Z791 Long term (current) use of non-steroidal anti-inflammatories (NSAID): Secondary | ICD-10-CM

## 2020-09-14 DIAGNOSIS — I9589 Other hypotension: Secondary | ICD-10-CM | POA: Diagnosis not present

## 2020-09-14 DIAGNOSIS — Z681 Body mass index (BMI) 19 or less, adult: Secondary | ICD-10-CM | POA: Diagnosis not present

## 2020-09-14 DIAGNOSIS — I480 Paroxysmal atrial fibrillation: Secondary | ICD-10-CM | POA: Diagnosis not present

## 2020-09-14 DIAGNOSIS — I4891 Unspecified atrial fibrillation: Secondary | ICD-10-CM

## 2020-09-14 DIAGNOSIS — G894 Chronic pain syndrome: Secondary | ICD-10-CM | POA: Diagnosis present

## 2020-09-14 DIAGNOSIS — R918 Other nonspecific abnormal finding of lung field: Secondary | ICD-10-CM | POA: Diagnosis present

## 2020-09-14 DIAGNOSIS — E538 Deficiency of other specified B group vitamins: Secondary | ICD-10-CM | POA: Diagnosis present

## 2020-09-14 DIAGNOSIS — E871 Hypo-osmolality and hyponatremia: Secondary | ICD-10-CM | POA: Diagnosis present

## 2020-09-14 DIAGNOSIS — E876 Hypokalemia: Secondary | ICD-10-CM | POA: Diagnosis present

## 2020-09-14 DIAGNOSIS — F17210 Nicotine dependence, cigarettes, uncomplicated: Secondary | ICD-10-CM | POA: Diagnosis present

## 2020-09-14 DIAGNOSIS — K529 Noninfective gastroenteritis and colitis, unspecified: Secondary | ICD-10-CM | POA: Diagnosis present

## 2020-09-14 DIAGNOSIS — Z79899 Other long term (current) drug therapy: Secondary | ICD-10-CM

## 2020-09-14 DIAGNOSIS — M40209 Unspecified kyphosis, site unspecified: Secondary | ICD-10-CM | POA: Diagnosis present

## 2020-09-14 DIAGNOSIS — Z880 Allergy status to penicillin: Secondary | ICD-10-CM

## 2020-09-14 DIAGNOSIS — I959 Hypotension, unspecified: Secondary | ICD-10-CM

## 2020-09-14 DIAGNOSIS — Z885 Allergy status to narcotic agent status: Secondary | ICD-10-CM

## 2020-09-14 LAB — URINALYSIS, COMPLETE (UACMP) WITH MICROSCOPIC
Bilirubin Urine: NEGATIVE
Glucose, UA: NEGATIVE mg/dL
Hgb urine dipstick: NEGATIVE
Ketones, ur: NEGATIVE mg/dL
Nitrite: NEGATIVE
Protein, ur: NEGATIVE mg/dL
Specific Gravity, Urine: 1.009 (ref 1.005–1.030)
pH: 6 (ref 5.0–8.0)

## 2020-09-14 LAB — CBC
HCT: 33.9 % — ABNORMAL LOW (ref 36.0–46.0)
Hemoglobin: 12.1 g/dL (ref 12.0–15.0)
MCH: 33 pg (ref 26.0–34.0)
MCHC: 35.7 g/dL (ref 30.0–36.0)
MCV: 92.4 fL (ref 80.0–100.0)
Platelets: 378 10*3/uL (ref 150–400)
RBC: 3.67 MIL/uL — ABNORMAL LOW (ref 3.87–5.11)
RDW: 13.1 % (ref 11.5–15.5)
WBC: 12.4 10*3/uL — ABNORMAL HIGH (ref 4.0–10.5)
nRBC: 0 % (ref 0.0–0.2)

## 2020-09-14 LAB — BASIC METABOLIC PANEL
Anion gap: 6 (ref 5–15)
BUN: 11 mg/dL (ref 8–23)
CO2: 23 mmol/L (ref 22–32)
Calcium: 7.9 mg/dL — ABNORMAL LOW (ref 8.9–10.3)
Chloride: 101 mmol/L (ref 98–111)
Creatinine, Ser: 0.79 mg/dL (ref 0.44–1.00)
GFR, Estimated: >60 mL/min (ref >60)
Glucose, Bld: 92 mg/dL (ref 70–99)
Potassium: 3.3 mmol/L — ABNORMAL LOW (ref 3.5–5.1)
Sodium: 130 mmol/L — ABNORMAL LOW (ref 135–145)

## 2020-09-14 LAB — LACTIC ACID, PLASMA: Lactic Acid, Venous: 1.3 mmol/L (ref 0.5–1.9)

## 2020-09-14 LAB — TROPONIN I (HIGH SENSITIVITY)
Troponin I (High Sensitivity): 7 ng/L (ref <18)
Troponin I (High Sensitivity): 6 ng/L (ref <18)

## 2020-09-14 MED ORDER — SODIUM CHLORIDE 0.9 % IV SOLN
1.0000 g | Freq: Once | INTRAVENOUS | Status: AC
  Administered 2020-09-14: 1 g via INTRAVENOUS
  Filled 2020-09-14: qty 10

## 2020-09-14 MED ORDER — SODIUM CHLORIDE 0.9 % IV BOLUS
1000.0000 mL | Freq: Once | INTRAVENOUS | Status: AC
  Administered 2020-09-15: 1000 mL via INTRAVENOUS

## 2020-09-14 MED ORDER — SODIUM CHLORIDE 0.9 % IV BOLUS
1000.0000 mL | Freq: Once | INTRAVENOUS | Status: AC
  Administered 2020-09-14: 1000 mL via INTRAVENOUS

## 2020-09-14 MED ORDER — METRONIDAZOLE IN NACL 5-0.79 MG/ML-% IV SOLN
500.0000 mg | Freq: Once | INTRAVENOUS | Status: AC
  Administered 2020-09-15: 500 mg via INTRAVENOUS
  Filled 2020-09-14: qty 100

## 2020-09-14 NOTE — Telephone Encounter (Signed)
I do not think that the syncope is related to her pulmonary diagnosis.  We will see what they determine.  This is usually a cardiac issue or other neurologic issue.

## 2020-09-14 NOTE — Progress Notes (Signed)
Attempted to input medication information after interviewing pt. Unable to get into chart due to Hospitalist having chart locked up. Per pt, medication list is correct and pt took doses yesterday 09/13/20 and antibiotic this morning 09/14/20. Will update chart when hospitalist unlocks chart for other personnel use.

## 2020-09-14 NOTE — ED Triage Notes (Signed)
Pt brought in via ems from home.  Pt passed out getting onto a stool.  Pt has a headache.  No chest pain or sob.  Pt has dizziness.  Pt has not eaten today.  Pt alert speech clear.

## 2020-09-14 NOTE — Telephone Encounter (Signed)
Called and spoke to patient's son, Jim(DPR). Rosanne Ashing stated that patient is being seen at ED for Syncope.  Rosanne Ashing wanted to make Dr. Jayme Cloud aware of this.  He is concerned that syncope may be associated with her pulmonary diagnosis. Rosanne Ashing also stated that patient has lost an additional  5lbs since 09/04/2020.  Dr. Jayme Cloud please advise. Thanks

## 2020-09-14 NOTE — ED Provider Notes (Signed)
[facility] Emergency Department Provider Note   ____________________________________________   I have reviewed the triage vital signs and the nursing notes.   HISTORY  Chief Complaint Loss of Consciousness   History limited by: Not Limited   HPI Suzanne Avila is a 74 y.o. female who presents to the emergency department today because of concerns for a syncopal episode.  Patient cannot recall if she was trying to get onto a stool or try to get off of the stool.  She denies any chest pain or palpitations prior to passing out.  She just recalls waking up on her left side.  She is having some slight discomfort to that side.  Patient denies any history of passing out.  She does wonder if it is somewhat related to the medications that she is recently started.  She has been evaluated for nodule and cavitary mass to her lung.  She feels like the medications have decreased her appetite and her oral intake has gone down.  Patient denies any recent fevers or chills.   Records reviewed. Per medical record review patient has a history of COPD, HLD, HTN. Currently being evaluated for lung mass.   Past Medical History:  Diagnosis Date  . Closed left arm fracture   . COPD (chronic obstructive pulmonary disease) (HCC)   . Glaucoma   . Hyperlipidemia   . Hypertension   . Lung mass     Patient Active Problem List   Diagnosis Date Noted  . Chronic low back pain (Left) w/o sciatica 09/10/2020  . Pharmacologic therapy 05/05/2020  . Chronic diarrhea 05/04/2020  . DDD (degenerative disc disease), lumbosacral 11/19/2019  . Therapeutic opioid-induced constipation (OIC) 08/01/2019  . Chronic sacroiliac joint pain (Left) 05/01/2019  . Chronic groin pain (Left) 05/01/2019  . Spondylosis without myelopathy or radiculopathy, lumbosacral region 05/01/2019  . Other specified dorsopathies, sacral and sacrococcygeal region 05/01/2019  . Renal artery stenosis (HCC) 12/27/2017  .  Pain in limb 12/22/2017  . Tobacco use disorder 12/22/2017  . Renal atrophy, right 12/22/2017  . Thoracic radiculitis 01/31/2017  . Chronic lower extremity pain (Primary Area of Pain) (Bilateral) (L>R) 04/25/2016  . Chronic upper extremity pain (Third area of Pain) (Bilateral) (L>R) 04/25/2016  . Disturbance of skin sensation 04/20/2016  . Neurogenic pain 04/20/2016  . Chronic hip pain (Bilateral) (L>R) 11/11/2015  . Vitamin D deficiency 11/11/2015  . Edema 09/02/2015  . 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 Encounter for therapeutic drug level monitoring 08/14/2015  . Long term current use of opiate analgesic 08/14/2015  . Long term prescription opiate use 08/14/2015  . Uncomplicated opioid dependence (HCC) 08/14/2015  . Opiate use (30 MME/Day) 08/14/2015  . Chronic low back pain (2ry area of Pain) (Bilateral) (L>R) w/ sciatica (Bilateral) 08/14/2015  . Chronic pain syndrome 08/14/2015  . Alcoholic (HCC) 08/14/2015  . B12 deficiency 08/14/2015  . Narrowing of intervertebral disc space 08/14/2015  . Encounter for screening for diabetes mellitus 08/14/2015  . History of metabolic disorder 08/14/2015  . Fungal infection of toenail 08/14/2015  . Osteopenia 08/14/2015  . Compulsive tobacco user syndrome 08/14/2015  . Osteoarthritis of hip (Left) 08/14/2015  . Chronic hip pain (Left) 08/14/2015  . Chronic lumbar radicular pain (L5 dermatome) (Bilateral) (L>R) 08/14/2015  . Lumbar facet syndrome (Bilateral) (L>R) 08/14/2015  . Lumbar spondylosis 08/14/2015  . 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 Lumbar Postlaminectomy syndrome (L4-5) 08/14/2015  . Fibromyalgia 08/14/2015  . Chronic airway  obstruction (HCC) 04/21/2015  . Allergic rhinitis 02/10/2010  . Atrial paroxysmal tachycardia (HCC) 07/06/2009  . CAFL (chronic airflow limitation) (HCC) 07/06/2009  . Acid reflux 07/06/2009  . Glaucoma 07/06/2009  . Benign essential HTN 07/06/2009  .  Hypercholesterolemia without hypertriglyceridemia 07/06/2009  . Absence of bladder continence 07/06/2009    Past Surgical History:  Procedure Laterality Date  . BACK SURGERY    . CARPAL TUNNEL RELEASE Left   . EYE SURGERY      Prior to Admission medications   Medication Sig Start Date End Date Taking? Authorizing Provider  albuterol (PROVENTIL) (2.5 MG/3ML) 0.083% nebulizer solution Take 3 mLs (2.5 mg total) by nebulization every 6 (six) hours as needed for wheezing or shortness of breath. 05/06/20   Margaretann Loveless, PA-C  aspirin 325 MG tablet Take 1 tablet (325 mg total) by mouth daily. 01/02/19   Margaretann Loveless, PA-C  atorvastatin (LIPITOR) 40 MG tablet Take 1 tablet (40 mg total) by mouth daily. 08/11/20   Margaretann Loveless, PA-C  diltiazem (CARDIZEM) 120 MG tablet Take 1 tablet (120 mg total) by mouth daily. 08/11/20   Margaretann Loveless, PA-C  dorzolamide (TRUSOPT) 2 % ophthalmic solution Place 1 drop into both eyes 3 (three) times daily. Patient taking differently: Place 1 drop into both eyes 2 (two) times daily.  01/02/19   Margaretann Loveless, PA-C  Fluticasone-Salmeterol (ADVAIR DISKUS) 250-50 MCG/DOSE AEPB USE 1 INHALATION TWICE A DAY 02/03/20   Margaretann Loveless, PA-C  furosemide (LASIX) 20 MG tablet TAKE 1 TABLET DAILY 05/24/20   Margaretann Loveless, PA-C  HYDROcodone-acetaminophen (NORCO) 7.5-325 MG tablet Take 1 tablet by mouth every 6 (six) hours as needed for severe pain. Must last 30 days 08/06/20 09/05/20  Delano Metz, MD  HYDROcodone-acetaminophen (NORCO) 7.5-325 MG tablet Take 1 tablet by mouth every 6 (six) hours as needed for severe pain. Must last 30 days Patient not taking: Reported on 09/04/2020 09/05/20 10/05/20  Delano Metz, MD  HYDROcodone-acetaminophen [facility]) 7.5-325 MG tablet Take 1 tablet by mouth every 6 (six) hours as needed for severe pain. Must last 30 days Patient not taking: Reported on 09/04/2020 10/05/20 11/04/20  Delano Metz, MD  Lactobacillus-Inulin (CULTURELLE ULTIMATE STRENGTH) CAPS Take 1 capsule by mouth daily. 09/04/20   Salena Saner, MD  latanoprost (XALATAN) 0.005 % ophthalmic solution Place 1 drop into both eyes at bedtime. 01/02/19   Margaretann Loveless, PA-C  levofloxacin (LEVAQUIN) 500 MG tablet Take 1 tablet (500 mg total) by mouth daily. 09/04/20   Salena Saner, MD  linaclotide [facility]) 145 MCG CAPS capsule Take 1 capsule (145 mcg total) by mouth daily before breakfast. Patient not taking: Reported on 09/04/2020 08/07/20   Margaretann Loveless, PA-C  magnesium gluconate (MAGONATE) 500 MG tablet Take 500 mg by mouth daily.    [provider]  meloxicam (MOBIC) 15 MG tablet TAKE 1 TABLET DAILY 05/24/20   Margaretann Loveless, PA-C  metroNIDAZOLE (FLAGYL) 500 MG tablet Take 1 tablet (500 mg total) by mouth 3 (three) times daily for 10 days. 09/04/20 09/14/20  Salena Saner, MD  moxifloxacin (AVELOX) 400 MG tablet Take 1 tablet (400 mg total) by mouth daily at 8 pm. 09/04/20   Salena Saner, MD  Multiple Vitamin (MULTI-VITAMIN) tablet Take 1 tablet by mouth daily.     [provider]  Multiple Vitamins-Minerals (VITAMIN D3 COMPLETE PO) Take by mouth.    [provider]  omeprazole (PRILOSEC) 40 MG capsule  Take 1 capsule (40 mg total) by mouth daily. 09/04/20   Salena Saner, MD  pregabalin (LYRICA) 150 MG capsule Take 1 capsule (150 mg total) by mouth every 8 (eight) hours. 08/06/20 02/02/21  Delano Metz, MD  ramipril (ALTACE) 10 MG capsule TAKE 1 CAPSULE DAILY 04/02/20   Margaretann Loveless, PA-C  rOPINIRole (REQUIP) 2 MG tablet Take 1 tablet (2 mg total) by mouth 2 (two) times daily. 06/15/20   Margaretann Loveless, PA-C  spironolactone (ALDACTONE) 25 MG tablet Take 1 tablet (25 mg total) by mouth daily. 08/11/20   Margaretann Loveless, PA-C  vitamin B-12 (CYANOCOBALAMIN) 500 MCG tablet Take 1 tablet (500 mcg total) by mouth daily. 01/02/19    Margaretann Loveless, PA-C    Allergies Codeine, Cyclobenzaprine, and Penicillins  Family History  Problem Relation Age of Onset  . Cancer Mother   . Cancer Father     Social History Social History   Tobacco Use  . Smoking status: Current Every Day Smoker    Packs/day: 1.00    Years: 50.00    Pack years: 50.00    Types: Cigarettes  . Smokeless tobacco: Never Used  Vaping Use  . Vaping Use: Former  Substance Use Topics  . Alcohol use: Yes    Alcohol/week: 35.0 standard drinks    Types: 35 Cans of beer per week    Comment: 4-5 beers every day  . Drug use: No    Review of Systems Constitutional: No fever/chills Eyes: No visual changes. ENT: No sore throat. Cardiovascular: Denies chest pain. Respiratory: Denies shortness of breath. Gastrointestinal: No abdominal pain.  No nausea, no vomiting.  No diarrhea.   Genitourinary: Negative for dysuria. Musculoskeletal: Negative for back pain. Skin: Negative for rash. Neurological: Positive for syncopal episode.  ____________________________________________   PHYSICAL EXAM:  VITAL SIGNS: ED Triage Vitals  Enc Vitals Group     BP 09/14/20 1544 104/66     Pulse Rate 09/14/20 1544 96     Resp 09/14/20 1544 18     Temp 09/14/20 1544 98.2 F (36.8 C)     Temp Source 09/14/20 1544 Oral     SpO2 09/14/20 1544 97 %     Weight 09/14/20 1547 87 lb (39.5 kg)     Height 09/14/20 1547 4\' 10"  (1.473 m)     Head Circumference --      Peak Flow --      Pain Score 09/14/20 1547 5    Constitutional: Alert and oriented.  Eyes: Conjunctivae are normal.  ENT      Head: Normocephalic and atraumatic.      Nose: No congestion/rhinnorhea.      Mouth/Throat: Mucous membranes are moist.      Neck: No stridor. Hematological/Lymphatic/Immunilogical: No cervical lymphadenopathy. Cardiovascular: Normal rate, regular rhythm.  No murmurs, rubs, or gallops.  Respiratory: Normal respiratory effort without tachypnea nor retractions. Breath  sounds are clear and equal bilaterally. No wheezes/rales/rhonchi. Gastrointestinal: Soft and non tender. No rebound. No guarding.  Genitourinary: Deferred Musculoskeletal: Normal range of motion in all extremities. No lower extremity edema. 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  Neurologic:  Normal speech and language. No gross focal neurologic deficits are appreciated.  Skin:  Skin is warm, dry and intact. No rash noted. Psychiatric: Mood and affect are normal. Speech and behavior are normal. Patient exhibits appropriate insight and judgment.  ____________________________________________    LABS (pertinent positives/negatives)  Trop hs 6 BMP na 130, k 3.3,  ca 7.9 CBC wbc 12.4, hgb 12.1, plt 378  ____________________________________________   EKG  I, Phineas SemenGraydon Volney Reierson, attending physician, personally viewed and interpreted this EKG  EKG Time: 1544 Rate: 93 Rhythm: sinus rhythm with PVC and PAC Axis: left axis deviation Intervals: qtc 435 QRS: narrow, q waves v1, v2, v3 ST changes: no st elevation Impression: abnormal ekg  ____________________________________________    RADIOLOGY  CXR Interval increase in right lung cavitary mass.   ____________________________________________   PROCEDURES  Procedures  ____________________________________________   INITIAL IMPRESSION / ASSESSMENT AND PLAN / ED COURSE  Pertinent labs & imaging results that were available during my care of the patient were reviewed by me and considered in my medical decision making (see chart for details).   Presented to the emergency department today after a syncopal episode.  On exam patient is awake and alert.  Patient's work-up was concerning for increase size of the cavitary mass seen in the right upper lobe.  Patient also has mild leukocytosis.  Discussed with Dr. Jayme CloudGonzalez with pulmonology.  There is concerned that this could be a lung abscess.  Will plan on admission  for IV antibiotics.  Discussed plan with patient.  In terms of the syncope do wonder if it was more vasovagal related.  Do think patient is somewhat dehydrated. Patient was given IV fluids.   ____________________________________________   FINAL CLINICAL IMPRESSION(S) / ED DIAGNOSES  Final diagnoses:  Syncope, unspecified syncope type  Leukocytosis, unspecified type  Cavitary lesion of lung     Note: This dictation was prepared with Dragon dictation. Any transcriptional errors that result from this process are unintentional     Phineas SemenGoodman, Peggyann Zwiefelhofer, MD 09/14/20 2342

## 2020-09-14 NOTE — ED Triage Notes (Signed)
FIRST NURSE: to ER via EMS from home had syncopal episode while sitting in a high chair and fell to ground.  Per husband was "out for about 30sec".  Pt now A&O.

## 2020-09-15 ENCOUNTER — Other Ambulatory Visit: Payer: Self-pay

## 2020-09-15 ENCOUNTER — Inpatient Hospital Stay
Admit: 2020-09-15 | Discharge: 2020-09-15 | Disposition: A | Discharge status: Home / Self Care | Payer: Medicare Other | Attending: Family Medicine | Admitting: Family Medicine

## 2020-09-15 DIAGNOSIS — R627 Adult failure to thrive: Secondary | ICD-10-CM | POA: Diagnosis not present

## 2020-09-15 DIAGNOSIS — J984 Other disorders of lung: Secondary | ICD-10-CM

## 2020-09-15 DIAGNOSIS — R1310 Dysphagia, unspecified: Secondary | ICD-10-CM

## 2020-09-15 DIAGNOSIS — I480 Paroxysmal atrial fibrillation: Secondary | ICD-10-CM | POA: Diagnosis not present

## 2020-09-15 DIAGNOSIS — R55 Syncope and collapse: Secondary | ICD-10-CM

## 2020-09-15 DIAGNOSIS — E860 Dehydration: Secondary | ICD-10-CM

## 2020-09-15 DIAGNOSIS — K529 Noninfective gastroenteritis and colitis, unspecified: Secondary | ICD-10-CM | POA: Diagnosis not present

## 2020-09-15 DIAGNOSIS — I959 Hypotension, unspecified: Secondary | ICD-10-CM

## 2020-09-15 DIAGNOSIS — I9589 Other hypotension: Secondary | ICD-10-CM

## 2020-09-15 DIAGNOSIS — E43 Unspecified severe protein-calorie malnutrition: Secondary | ICD-10-CM

## 2020-09-15 HISTORY — DX: Dehydration: E86.0

## 2020-09-15 LAB — CBC
HCT: 27.5 % — ABNORMAL LOW (ref 36.0–46.0)
Hemoglobin: 9.9 g/dL — ABNORMAL LOW (ref 12.0–15.0)
MCH: 32.5 pg (ref 26.0–34.0)
MCHC: 36 g/dL (ref 30.0–36.0)
MCV: 90.2 fL (ref 80.0–100.0)
Platelets: 321 10*3/uL (ref 150–400)
RBC: 3.05 MIL/uL — ABNORMAL LOW (ref 3.87–5.11)
RDW: 13.1 % (ref 11.5–15.5)
WBC: 12.5 10*3/uL — ABNORMAL HIGH (ref 4.0–10.5)
nRBC: 0 % (ref 0.0–0.2)

## 2020-09-15 LAB — MAGNESIUM
Magnesium: 1.7 mg/dL (ref 1.7–2.4)
Magnesium: 1.4 mg/dL — ABNORMAL LOW (ref 1.7–2.4)

## 2020-09-15 LAB — ECHOCARDIOGRAM COMPLETE
AV Mean grad: 5 mmHg
AV Peak grad: 9.7 mmHg
Ao pk vel: 1.56 m/s
Area-P 1/2: 3.27 cm2
Height: 58 in
S' Lateral: 2.08 cm
Weight: 1392 oz

## 2020-09-15 LAB — PROTIME-INR
INR: 1.3 — ABNORMAL HIGH (ref 0.8–1.2)
Prothrombin Time: 15.9 seconds — ABNORMAL HIGH (ref 11.4–15.2)

## 2020-09-15 LAB — RESP PANEL BY RT-PCR (FLU A&B, COVID) ARPGX2
Influenza A by PCR: NEGATIVE
Influenza B by PCR: NEGATIVE
SARS Coronavirus 2 by RT PCR: NEGATIVE

## 2020-09-15 LAB — BASIC METABOLIC PANEL
Anion gap: 10 (ref 5–15)
BUN: 11 mg/dL (ref 8–23)
CO2: 16 mmol/L — ABNORMAL LOW (ref 22–32)
Calcium: 7.6 mg/dL — ABNORMAL LOW (ref 8.9–10.3)
Chloride: 108 mmol/L (ref 98–111)
Creatinine, Ser: 0.86 mg/dL (ref 0.44–1.00)
GFR, Estimated: >60 mL/min (ref >60)
Glucose, Bld: 87 mg/dL (ref 70–99)
Potassium: 3.4 mmol/L — ABNORMAL LOW (ref 3.5–5.1)
Sodium: 134 mmol/L — ABNORMAL LOW (ref 135–145)

## 2020-09-15 LAB — C DIFFICILE QUICK SCREEN W PCR REFLEX
C Diff antigen: NEGATIVE
C Diff interpretation: NOT DETECTED
C Diff toxin: NEGATIVE

## 2020-09-15 LAB — PROCALCITONIN: Procalcitonin: 0.1 ng/mL

## 2020-09-15 LAB — IRON AND TIBC
Iron: 13 ug/dL — ABNORMAL LOW (ref 28–170)
Saturation Ratios: 10 % — ABNORMAL LOW (ref 10.4–31.8)
TIBC: 130 ug/dL — ABNORMAL LOW (ref 250–450)
UIBC: 117 ug/dL

## 2020-09-15 LAB — CORTISOL-AM, BLOOD: Cortisol - AM: 25.6 ug/dL — ABNORMAL HIGH (ref 6.7–22.6)

## 2020-09-15 LAB — PHOSPHORUS: Phosphorus: 2.2 mg/dL — ABNORMAL LOW (ref 2.5–4.6)

## 2020-09-15 LAB — VITAMIN B12: Vitamin B-12: 4364 pg/mL — ABNORMAL HIGH (ref 180–914)

## 2020-09-15 LAB — MRSA PCR SCREENING: MRSA by PCR: NEGATIVE

## 2020-09-15 LAB — GLUCOSE, CAPILLARY: Glucose-Capillary: 91 mg/dL (ref 70–99)

## 2020-09-15 LAB — LACTIC ACID, PLASMA: Lactic Acid, Venous: 1.1 mmol/L (ref 0.5–1.9)

## 2020-09-15 MED ORDER — ADULT MULTIVITAMIN W/MINERALS CH
1.0000 | ORAL_TABLET | Freq: Every day | ORAL | Status: DC
  Administered 2020-09-15 – 2020-09-17 (×3): 1 via ORAL
  Filled 2020-09-15 (×3): qty 1

## 2020-09-15 MED ORDER — APIXABAN 2.5 MG PO TABS: 2.5000 mg | ORAL_TABLET | Freq: Two times a day (BID) | ORAL | Status: DC

## 2020-09-15 MED ORDER — FLUCONAZOLE IN SODIUM CHLORIDE 200-0.9 MG/100ML-% IV SOLN
200.0000 mg | Freq: Once | INTRAVENOUS | Status: DC
  Filled 2020-09-15: qty 100

## 2020-09-15 MED ORDER — PIPERACILLIN-TAZOBACTAM 3.375 G IVPB
3.3750 g | Freq: Three times a day (TID) | INTRAVENOUS | Status: DC
  Administered 2020-09-15 – 2020-09-16 (×3): 3.375 g via INTRAVENOUS
  Filled 2020-09-15 (×4): qty 50

## 2020-09-15 MED ORDER — SODIUM CHLORIDE 0.9 % IV SOLN: 1.0000 g | Freq: Once | INTRAVENOUS | Status: DC

## 2020-09-15 MED ORDER — ONDANSETRON HCL 4 MG/2ML IJ SOLN: 4.0000 mg | Freq: Four times a day (QID) | INTRAMUSCULAR | Status: DC | PRN

## 2020-09-15 MED ORDER — ONDANSETRON HCL 4 MG PO TABS: 4.0000 mg | ORAL_TABLET | Freq: Four times a day (QID) | ORAL | Status: DC | PRN

## 2020-09-15 MED ORDER — DILTIAZEM HCL 25 MG/5ML IV SOLN
10.0000 mg | Freq: Once | INTRAVENOUS | Status: AC
  Administered 2020-09-15: 10 mg via INTRAVENOUS

## 2020-09-15 MED ORDER — AMIODARONE HCL IN DEXTROSE 360-4.14 MG/200ML-% IV SOLN
60.0000 mg/h | INTRAVENOUS | Status: AC
  Administered 2020-09-15: 60 mg/h via INTRAVENOUS
  Filled 2020-09-15: qty 200

## 2020-09-15 MED ORDER — GUAIFENESIN ER 600 MG PO TB12
600.0000 mg | ORAL_TABLET | Freq: Two times a day (BID) | ORAL | Status: DC
  Administered 2020-09-15 – 2020-09-17 (×5): 600 mg via ORAL
  Filled 2020-09-15 (×5): qty 1

## 2020-09-15 MED ORDER — THIAMINE HCL 100 MG/ML IJ SOLN
100.0000 mg | Freq: Every day | INTRAMUSCULAR | Status: DC
  Filled 2020-09-15: qty 2

## 2020-09-15 MED ORDER — SODIUM CHLORIDE 0.9 % IV SOLN: 500.0000 mg | INTRAVENOUS | Status: DC

## 2020-09-15 MED ORDER — VANCOMYCIN HCL 750 MG/150ML IV SOLN
750.0000 mg | INTRAVENOUS | Status: DC
  Filled 2020-09-15: qty 150

## 2020-09-15 MED ORDER — LORAZEPAM 2 MG/ML IJ SOLN: 1.0000 mg | INTRAMUSCULAR | Status: DC | PRN

## 2020-09-15 MED ORDER — SODIUM CHLORIDE 0.9 % IV SOLN
500.0000 mg | INTRAVENOUS | Status: DC
  Filled 2020-09-15: qty 500

## 2020-09-15 MED ORDER — LOPERAMIDE HCL 2 MG PO CAPS
2.0000 mg | ORAL_CAPSULE | ORAL | Status: DC | PRN
  Administered 2020-09-16: 2 mg via ORAL
  Filled 2020-09-15: qty 1

## 2020-09-15 MED ORDER — ENSURE ENLIVE PO LIQD
237.0000 mL | Freq: Two times a day (BID) | ORAL | Status: DC
  Administered 2020-09-15 – 2020-09-16 (×4): 237 mL via ORAL

## 2020-09-15 MED ORDER — MORPHINE SULFATE (PF) 2 MG/ML IV SOLN: 1.0000 mg | INTRAVENOUS | Status: DC | PRN

## 2020-09-15 MED ORDER — POTASSIUM & SODIUM PHOSPHATES 280-160-250 MG PO PACK
1.0000 | PACK | Freq: Three times a day (TID) | ORAL | Status: AC
  Administered 2020-09-15: 1 via ORAL
  Filled 2020-09-15 (×3): qty 1

## 2020-09-15 MED ORDER — POTASSIUM CHLORIDE 20 MEQ PO PACK
40.0000 meq | PACK | Freq: Once | ORAL | Status: AC
  Administered 2020-09-15: 40 meq via ORAL
  Filled 2020-09-15: qty 2

## 2020-09-15 MED ORDER — LOPERAMIDE HCL 2 MG PO CAPS
4.0000 mg | ORAL_CAPSULE | Freq: Once | ORAL | Status: AC
  Administered 2020-09-15: 4 mg via ORAL
  Filled 2020-09-15: qty 2

## 2020-09-15 MED ORDER — ACETAMINOPHEN 325 MG PO TABS: 650.0000 mg | ORAL_TABLET | Freq: Four times a day (QID) | ORAL | Status: DC | PRN

## 2020-09-15 MED ORDER — MAGNESIUM SULFATE 2 GM/50ML IV SOLN
2.0000 g | Freq: Once | INTRAVENOUS | Status: AC
  Administered 2020-09-15: 2 g via INTRAVENOUS
  Filled 2020-09-15: qty 50

## 2020-09-15 MED ORDER — ENOXAPARIN SODIUM 30 MG/0.3ML ~~LOC~~ SOLN
30.0000 mg | SUBCUTANEOUS | Status: DC
  Administered 2020-09-15: 30 mg via SUBCUTANEOUS

## 2020-09-15 MED ORDER — SODIUM CHLORIDE 0.9 % IV SOLN: INTRAVENOUS | Status: DC

## 2020-09-15 MED ORDER — NYSTATIN 100000 UNIT/ML MT SUSP: 5.0000 mL | Freq: Four times a day (QID) | OROMUCOSAL | Status: DC

## 2020-09-15 MED ORDER — SODIUM CHLORIDE 0.9 % IV SOLN: 50.0000 mg | INTRAVENOUS | Status: DC

## 2020-09-15 MED ORDER — ACETAMINOPHEN 650 MG RE SUPP: 650.0000 mg | Freq: Four times a day (QID) | RECTAL | Status: DC | PRN

## 2020-09-15 MED ORDER — IPRATROPIUM-ALBUTEROL 0.5-2.5 (3) MG/3ML IN SOLN
3.0000 mL | Freq: Four times a day (QID) | RESPIRATORY_TRACT | Status: DC
  Administered 2020-09-15 – 2020-09-16 (×5): 3 mL via RESPIRATORY_TRACT
  Filled 2020-09-15 (×5): qty 3

## 2020-09-15 MED ORDER — SODIUM CHLORIDE 0.9 % IV SOLN
1.0000 g | Freq: Two times a day (BID) | INTRAVENOUS | Status: DC
  Administered 2020-09-15: 1 g via INTRAVENOUS
  Filled 2020-09-15 (×2): qty 1

## 2020-09-15 MED ORDER — HYDROCODONE-ACETAMINOPHEN 7.5-325 MG PO TABS
1.0000 | ORAL_TABLET | Freq: Four times a day (QID) | ORAL | Status: DC | PRN
  Administered 2020-09-15 – 2020-09-16 (×2): 1 via ORAL
  Filled 2020-09-15 (×2): qty 1

## 2020-09-15 MED ORDER — AMIODARONE HCL 200 MG PO TABS: 200.0000 mg | ORAL_TABLET | Freq: Every day | ORAL | Status: DC

## 2020-09-15 MED ORDER — MAGNESIUM HYDROXIDE 400 MG/5ML PO SUSP
30.0000 mL | Freq: Every day | ORAL | Status: DC | PRN
  Filled 2020-09-15: qty 30

## 2020-09-15 MED ORDER — HYDROCOD POLST-CPM POLST ER 10-8 MG/5ML PO SUER
5.0000 mL | Freq: Two times a day (BID) | ORAL | Status: DC | PRN
  Administered 2020-09-16: 5 mL via ORAL
  Filled 2020-09-15: qty 5

## 2020-09-15 MED ORDER — TRAZODONE HCL 50 MG PO TABS
25.0000 mg | ORAL_TABLET | Freq: Every evening | ORAL | Status: DC | PRN
  Administered 2020-09-15: 25 mg via ORAL
  Filled 2020-09-15: qty 1

## 2020-09-15 MED ORDER — SODIUM CHLORIDE 0.9 % IV SOLN
2.0000 g | Freq: Once | INTRAVENOUS | Status: DC
  Filled 2020-09-15: qty 2

## 2020-09-15 MED ORDER — VANCOMYCIN HCL IN DEXTROSE 1-5 GM/200ML-% IV SOLN
1000.0000 mg | Freq: Once | INTRAVENOUS | Status: AC
  Administered 2020-09-15: 1000 mg via INTRAVENOUS
  Filled 2020-09-15: qty 200

## 2020-09-15 MED ORDER — FLUCONAZOLE 100MG IVPB: 100.0000 mg | INTRAVENOUS | Status: DC

## 2020-09-15 MED ORDER — PREGABALIN 75 MG PO CAPS
150.0000 mg | ORAL_CAPSULE | Freq: Three times a day (TID) | ORAL | Status: DC
  Administered 2020-09-15 – 2020-09-17 (×6): 150 mg via ORAL
  Filled 2020-09-15 (×6): qty 2

## 2020-09-15 MED ORDER — POTASSIUM CHLORIDE CRYS ER 20 MEQ PO TBCR
40.0000 meq | EXTENDED_RELEASE_TABLET | Freq: Once | ORAL | Status: AC
  Administered 2020-09-15: 40 meq via ORAL
  Filled 2020-09-15: qty 2

## 2020-09-15 MED ORDER — CHLORHEXIDINE GLUCONATE CLOTH 2 % EX PADS
6.0000 | MEDICATED_PAD | Freq: Every day | CUTANEOUS | Status: DC
  Administered 2020-09-15: 6 via TOPICAL
  Filled 2020-09-15: qty 6

## 2020-09-15 MED ORDER — DILTIAZEM LOAD VIA INFUSION: 10.0000 mg | Freq: Once | INTRAVENOUS | Status: DC

## 2020-09-15 MED ORDER — DILTIAZEM HCL-DEXTROSE 125-5 MG/125ML-% IV SOLN (PREMIX)
5.0000 mg/h | INTRAVENOUS | Status: DC
  Filled 2020-09-15: qty 125

## 2020-09-15 MED ORDER — AMIODARONE HCL 200 MG PO TABS
400.0000 mg | ORAL_TABLET | Freq: Once | ORAL | Status: AC
  Administered 2020-09-15: 17:00:00 400 mg via ORAL
  Filled 2020-09-15: qty 2

## 2020-09-15 MED ORDER — GERHARDT'S BUTT CREAM
TOPICAL_CREAM | Freq: Two times a day (BID) | CUTANEOUS | Status: DC
  Filled 2020-09-15 (×2): qty 1

## 2020-09-15 MED ORDER — PANCRELIPASE (LIP-PROT-AMYL) 12000-38000 UNITS PO CPEP
24000.0000 [IU] | ORAL_CAPSULE | Freq: Three times a day (TID) | ORAL | Status: DC
  Administered 2020-09-15 – 2020-09-17 (×4): 24000 [IU] via ORAL
  Filled 2020-09-15 (×7): qty 2

## 2020-09-15 MED ORDER — DILTIAZEM HCL-DEXTROSE 125-5 MG/125ML-% IV SOLN (PREMIX): 5.0000 mg/h | INTRAVENOUS | Status: DC

## 2020-09-15 MED ORDER — SODIUM CHLORIDE 0.9 % IV SOLN
50.0000 mg | INTRAVENOUS | Status: AC
  Filled 2020-09-15 (×3): qty 50

## 2020-09-15 MED ORDER — SODIUM CHLORIDE 0.9 % IV SOLN
100.0000 mg | Freq: Once | INTRAVENOUS | Status: AC
  Administered 2020-09-15: 100 mg via INTRAVENOUS
  Filled 2020-09-15: qty 100

## 2020-09-15 MED ORDER — THIAMINE HCL 100 MG PO TABS
100.0000 mg | ORAL_TABLET | Freq: Every day | ORAL | Status: DC
  Administered 2020-09-15 – 2020-09-17 (×3): 100 mg via ORAL
  Filled 2020-09-15 (×3): qty 1

## 2020-09-15 MED ORDER — PANTOPRAZOLE SODIUM 40 MG IV SOLR
40.0000 mg | Freq: Every day | INTRAVENOUS | Status: DC
  Administered 2020-09-15 – 2020-09-16 (×2): 40 mg via INTRAVENOUS
  Filled 2020-09-15 (×2): qty 40

## 2020-09-15 MED ORDER — LORAZEPAM 1 MG PO TABS: 1.0000 mg | ORAL_TABLET | ORAL | Status: DC | PRN

## 2020-09-15 MED ORDER — ENOXAPARIN SODIUM 40 MG/0.4ML ~~LOC~~ SOLN: 40.0000 mg | SUBCUTANEOUS | Status: DC

## 2020-09-15 MED ORDER — AMIODARONE HCL IN DEXTROSE 360-4.14 MG/200ML-% IV SOLN
30.0000 mg/h | INTRAVENOUS | Status: DC
  Administered 2020-09-15: 30 mg/h via INTRAVENOUS
  Filled 2020-09-15: qty 200

## 2020-09-15 MED ORDER — FOLIC ACID 1 MG PO TABS
1.0000 mg | ORAL_TABLET | Freq: Every day | ORAL | Status: DC
  Administered 2020-09-15 – 2020-09-17 (×3): 1 mg via ORAL
  Filled 2020-09-15 (×3): qty 1

## 2020-09-15 MED ORDER — SODIUM CHLORIDE 0.9 % IV BOLUS
500.0000 mL | Freq: Once | INTRAVENOUS | Status: AC
  Administered 2020-09-15: 500 mL via INTRAVENOUS

## 2020-09-15 MED ORDER — AMIODARONE LOAD VIA INFUSION
150.0000 mg | Freq: Once | INTRAVENOUS | Status: DC
  Filled 2020-09-15: qty 83.34

## 2020-09-15 NOTE — Consult Note (Deleted)
Date: 09/15/2020,   MRN# [id number] MITZY NARON [birth year] Code Status:     Code Status Orders  (From admission, onward)         Start     Ordered   09/15/20 0109  Full code  Continuous        09/15/20 0108        Code Status History    This patient has a current code status but no historical code status.   Advance Care Planning Activity     Hosp day:@LENGTHOFSTAYDAYS @ Referring MD: @ATDPROV @     PCP:      CC:   HPI:   PMHX:   Past Medical History:  Diagnosis Date  . Closed left arm fracture   . COPD (chronic obstructive pulmonary disease) (HCC)   . Glaucoma   . Hyperlipidemia   . Hypertension   . Lung mass    Surgical Hx:  Past Surgical History:  Procedure Laterality Date  . BACK SURGERY    . CARPAL TUNNEL RELEASE Left   . EYE SURGERY     Family Hx:  Family History  Problem Relation Age of Onset  . Cancer Mother   . Cancer Father    Social Hx:   Social History   Tobacco Use  . Smoking status: Current Every Day Smoker    Packs/day: 1.00    Years: 50.00    Pack years: 50.00    Types: Cigarettes  . Smokeless tobacco: Never Used  Vaping Use  . Vaping Use: Former  Substance Use Topics  . Alcohol use: Yes    Alcohol/week: 35.0 standard drinks    Types: 35 Cans of beer per week    Comment: 4-5 beers every day  . Drug use: No   Medication:    Home Medication:    Current Medication: @CURMEDTAB @   Allergies:  Codeine, Cyclobenzaprine, and Penicillins  Review of Systems: Gen:  Denies  fever, sweats, chills HEENT: Denies blurred vision, double vision, ear pain, eye pain, hearing loss, nose bleeds, sore throat Cvc:  No dizziness, chest pain or heaviness Resp:    Gi: Denies swallowing difficulty, stomach pain, nausea or vomiting, diarrhea, constipation, bowel incontinence Gu:  Denies bladder incontinence, burning urine Ext:   No Joint pain, stiffness or swelling Skin: No skin rash, easy bruising or bleeding or hives Endoc:  No  polyuria, polydipsia , polyphagia or weight change Psych: No depression, insomnia or hallucinations  Other:  All other systems negative  Physical Examination:   VS: BP 112/63   Pulse 78   Temp 98.1 F (36.7 C) (Oral)   Resp (!) 22   Ht 4\' 10"  (1.473 m)   Wt 39.5 kg   SpO2 98%   BMI 18.18 kg/m   General Appearance: No distress  Neuro: without focal findings, mental status, speech normal, alert and oriented, cranial nerves 2-12 intact, reflexes normal and symmetric, sensation grossly normal  HEENT: PERRLA, EOM intact, no ptosis, no other lesions noticed, Mallampati: Pulmonary:.No wheezing, No rales  Sputum Production:   Cardiovascular:  Normal S1,S2.  No m/r/g.  Abdominal aorta pulsation normal.    Abdomen:Benign, Soft, non-tender, No masses, hepatosplenomegaly, No lymphadenopathy Endoc: No evident thyromegaly, no signs of acromegaly or Cushing features Skin:   warm, no rashes, no ecchymosis  Extremities: normal, no cyanosis, clubbing, no edema, warm with normal capillary refill. Other findings:   Labs results:   Recent Labs    09/14/20 1551 09/15/20 0702  HGB 12.1 9.9*  HCT 33.9*  27.5*  MCV 92.4 90.2  WBC 12.4* 12.5*  BUN 11 11  CREATININE 0.79 0.86  GLUCOSE 92 87  CALCIUM 7.9* 7.6*  INR  --  1.3*  ,    No results for input(s): PH in the last 72 hours.  Invalid input(s): PCO2, PO2, BASEEXCESS, BASEDEFICITE, TFT  Culture results:     Rad results:   DG Chest 2 View  Result Date: 09/14/2020 CLINICAL DATA:  Dizzy. EXAM: CHEST - 2 VIEW COMPARISON:  PET CT November 9 21. FINDINGS: When comparing across modalities to PET-CT from September 01, 2020, apparent continued interval increase in size of a masslike area of consolidation with central cavitation in the right upper lobe with air-fluid level. Surrounding opacities. Similar cardiomediastinal silhouette. No visible pleural effusions or pneumothorax. No acute osseous abnormality. IMPRESSION: When comparing across  modalities to PET-CT from September 01, 2020, apparent continued interval increase in size of a masslike area of consolidation with central cavitation in the right upper lobe with air-fluid level. As noted on prior PET-CT, findings raise concern for cavitary pneumonia (possibly with developing pulmonary abscess). Progressive necrotic bronchogenic carcinoma is not excluded. Dedicated chest CT with contrast could better evaluate and allow for more direct comparison, if clinically indicated. Findings discussed with Dr. Marisa Severin At 4:42 via telephone. Electronically Signed   By: Feliberto Harts MD   On: 09/14/2020 16:48   ECHOCARDIOGRAM COMPLETE  Result Date: 09/15/2020    ECHOCARDIOGRAM REPORT   Patient Name:   FORREST DEMURO Date of Exam: 09/15/2020 Medical Rec #:  [id number]        Height:       58.0 in Accession #:    [id number]       Weight:       87.0 lb Date of Birth:  1946-07-12        BSA:          1.278 m Patient Age:    74 years         BP:           108/66 mmHg Patient Gender: F                HR:           86 bpm. Exam Location:  [facility] Procedure: 2D Echo, Color Doppler and Cardiac Doppler Indications:     I48.91 Atrial Fibrillation  History:         Patient has no prior history of Echocardiogram examinations.                  COPD; Risk Factors:Hypertension and Dyslipidemia.  Sonographer:     Humphrey Rolls RDCS (AE) Referring Phys:  [id number] Vernetta Honey MANSY Diagnosing Phys: Adrian Blackwater MD IMPRESSIONS  1. Left ventricular ejection fraction, by estimation, is 60 to 65%. The left ventricle has normal function. The left ventricle has no regional wall motion abnormalities. Left ventricular diastolic parameters are consistent with Grade I diastolic dysfunction (impaired relaxation).  2. Right ventricular systolic function is normal. The right ventricular size is normal.  3. Left atrial size was moderately dilated.  4. Right atrial size was moderately dilated.  5. The mitral valve is normal in structure. Mild mitral  valve regurgitation. No evidence of mitral stenosis. Moderate mitral annular calcification.  6. The aortic valve is normal in structure. Aortic valve regurgitation is not visualized. No aortic stenosis is present.  7. The inferior vena cava is normal in size with greater than 50% respiratory variability, suggesting  right atrial pressure of 3 mmHg. FINDINGS  Left Ventricle: Left ventricular ejection fraction, by estimation, is 60 to 65%. The left ventricle has normal function. The left ventricle has no regional wall motion abnormalities. The left ventricular internal cavity size was normal in size. There is  no left ventricular hypertrophy. Left ventricular diastolic parameters are consistent with Grade I diastolic dysfunction (impaired relaxation). Right Ventricle: The right ventricular size is normal. No increase in right ventricular wall thickness. Right ventricular systolic function is normal. Left Atrium: Left atrial size was moderately dilated. Right Atrium: Right atrial size was moderately dilated. Pericardium: There is no evidence of pericardial effusion. Mitral Valve: The mitral valve is normal in structure. Moderate mitral annular calcification. Mild mitral valve regurgitation. No evidence of mitral valve stenosis. MV peak gradient, 7.4 mmHg. The mean mitral valve gradient is 4.0 mmHg. Tricuspid Valve: The tricuspid valve is normal in structure. Tricuspid valve regurgitation is mild . No evidence of tricuspid stenosis. Aortic Valve: The aortic valve is normal in structure. Aortic valve regurgitation is not visualized. No aortic stenosis is present. Aortic valve mean gradient measures 5.0 mmHg. Aortic valve peak gradient measures 9.7 mmHg. Pulmonic Valve: The pulmonic valve was normal in structure. Pulmonic valve regurgitation is not visualized. No evidence of pulmonic stenosis. Aorta: The aortic root is normal in size and structure. Venous: The inferior vena cava is normal in size with greater than 50%  respiratory variability, suggesting right atrial pressure of 3 mmHg. IAS/Shunts: No atrial level shunt detected by color flow Doppler.  LEFT VENTRICLE PLAX 2D LVIDd:         3.85 cm Diastology LVIDs:         2.08 cm LV e' medial:    7.18 cm/s LV PW:         1.07 cm LV E/e' medial:  18.2 LV IVS:        0.78 cm LV e' lateral:   7.62 cm/s                        LV E/e' lateral: 17.2  RIGHT VENTRICLE RV Basal diam:  3.04 cm LEFT ATRIUM           Index       RIGHT ATRIUM           Index LA diam:      3.70 cm 2.89 cm/m  RA Area:     10.20 cm LA Vol (A4C): 48.3 ml 37.78 ml/m RA Volume:   20.40 ml  15.96 ml/m  AORTIC VALVE                    PULMONIC VALVE AV Vmax:           156.00 cm/s  PV Vmax:       1.18 m/s AV Vmean:          108.000 cm/s PV Vmean:      75.600 cm/s AV VTI:            0.307 m      PV VTI:        0.209 m AV Peak Grad:      9.7 mmHg     PV Peak grad:  5.6 mmHg AV Mean Grad:      5.0 mmHg     PV Mean grad:  3.0 mmHg LVOT Vmax:         106.00 cm/s LVOT Vmean:  68.100 cm/s LVOT VTI:          0.216 m LVOT/AV VTI ratio: 0.70 MITRAL VALVE                TRICUSPID VALVE MV Area (PHT): 3.27 cm     TR Peak grad:   25.0 mmHg MV Peak grad:  7.4 mmHg     TR Vmax:        250.00 cm/s MV Mean grad:  4.0 mmHg MV Vmax:       1.36 m/s     SHUNTS MV Vmean:      93.3 cm/s    Systemic VTI: 0.22 m MV Decel Time: 232 msec MV E velocity: 131.00 cm/s MV A velocity: 93.00 cm/s MV E/A ratio:  1.41 Adrian Blackwater MD Electronically signed by Adrian Blackwater MD Signature Date/Time: 09/15/2020/12:14:22 PM    Final    CLINICAL DATA:  Lung cancer screening. Current smoker with 67 pack-year history. Asymptomatic.  EXAM: CT CHEST WITHOUT CONTRAST LOW-DOSE FOR LUNG CANCER SCREENING  TECHNIQUE: Multidetector CT imaging of the chest was performed following the standard protocol without IV contrast.  COMPARISON:  07/19/2019  FINDINGS: Cardiovascular: The heart size appears within normal limits. No pericardial effusion.  Aortic atherosclerosis. Coronary artery calcifications.  Mediastinum/Nodes: Normal appearance of the thyroid gland. The trachea appears patent and is midline. No enlarged mediastinal, hilar, axillary, or supraclavicular adenopathy.  Lungs/Pleura: Mild to moderate changes of centrilobular emphysema. Within the posterior lateral right upper lobe, in the area of previously noted 6.6 mm part solid nodule, there is large part-solid mass with central cavitation. This measures 35.9 mm with central solid component measuring 25.4 mm. Cavitation is identified within the central portions of the solid component.  Upper Abdomen: No acute abnormality. Asymmetric right renal atrophy. Aortic atherosclerosis.  Musculoskeletal: No chest wall mass or suspicious bone lesions identified.  IMPRESSION: 1. Lung-RADS 4B, suspicious. Additional imaging evaluation or consultation with Pulmonology or Thoracic Surgery recommended. 2. Coronary artery calcifications.  Aortic Atherosclerosis (ICD10-I70.0) and Emphysema (ICD10-J43.9).  These results will be called to the ordering clinician or representative by the Radiologist Assistant, and communication documented in the PACS or [employer].   Electronically Signed   By: Signa Kell M.D.   On: 08/20/2020 15:47    EKG:     Other:   Assessment and Plan:    I have personally obtained a history, examined the patient, evaluated laboratory and imaging results, formulated the assessment and plan and placed orders.  The Patient requires high complexity decision making for assessment and support, frequent evaluation and titration of therapies, application of advanced monitoring technologies and extensive interpretation of multiple databases.   Ilynn Stauffer,M.D. Pulmonary & Critical care Medicine [facility]

## 2020-09-15 NOTE — ED Notes (Addendum)
Pt able to ambulate to restroom with assistance. Cleaned from diarrhea and helped back into bed by this RN. After in bed pt started reporting pain in right arm. Upon assessment of IV RN originally noted swelling but IV flushes without continued pain or swelling.

## 2020-09-15 NOTE — H&P (Addendum)
[place]   PATIENT NAME: Suzanne Avila    MR#:  [id number]  DATE OF BIRTH:  Sep 04, 1946  DATE OF ADMISSION:  09/14/2020  PRIMARY CARE PHYSICIAN: Margaretann Loveless, PA-C   REQUESTING/REFERRING PHYSICIAN: Phineas Semen, MD/Smith, Earna Coder, MD  CHIEF COMPLAINT:   Chief Complaint  Patient presents with  . Loss of Consciousness    HISTORY OF PRESENT ILLNESS:  Suzanne Avila  is a 74 y.o. Caucasian female with a known history of COPD, glaucoma, hypertension, dyslipidemia and cavitary right upper lobe mass, who presented to the emergency room with acute onset of syncope while getting off of a barstool with subsequent fall to the left side without injuries.  She has been having diminished appetite lately with subsequent poor p.o. intake.  This has been associated with dysphagia.  She admits to recent cough and mild dyspnea without wheezing.  No chest pain or palpitations at home however she developed atrial fibrillation with rapid ventricular sponsor in the emergency room with associated palpitations.  No nausea or vomiting or abdominal pain.  No melena or bright red blood per rectum.  No dysuria, oliguria or hematuria or flank pain.  Upon presentation to the emergency room, vital signs were within normal.  She was was later hypotensive despite 2 L of IV normal saline.  Heart rate was up to 137 when she was in atrial fibrillation with RVR.  Pulse oximetry has been within normal throughout her ER stay.  Her blood pressure later improved and she maintained adequate MAP that was up to the 80s..  Labs revealed negative COVID-19 PCR and influenza antigens.  Blood cultures were drawn.  BMP revealed hyponatremia 130 and hypokalemia of 3.3.  Magnesium was 1.7.  High-sensitivity troponin was 7 and later 6.  CBC showed Leukocytosis of 12.4.  UA was unremarkable.  Two-view chest x-ray showed continued interval increase in size of a masslike area of consolidation with central cavitation in the  right upper lobe with air-fluid level, possibly cavitary pneumonia with developing pulmonary abscess with progressive necrotic bronchogenic carcinoma not excluded.  Dr. Jayme Cloud was notified with the patient.  The patient was given IV Rocephin and Flagyl as well as 1 L bolus of IV normal saline.  When she developed atrial fibrillation with RVR she was given 10 mg of IV Cardizem as well as IV amiodarone bolus and drip.  She will be admitted to stepdown unit for further evaluation and management.  PAST MEDICAL HISTORY:   Past Medical History:  Diagnosis Date  . Closed left arm fracture   . COPD (chronic obstructive pulmonary disease) (HCC)   . Glaucoma   . Hyperlipidemia   . Hypertension   . Lung mass     PAST SURGICAL HISTORY:   Past Surgical History:  Procedure Laterality Date  . BACK SURGERY    . CARPAL TUNNEL RELEASE Left   . EYE SURGERY      SOCIAL HISTORY:   Social History   Tobacco Use  . Smoking status: Current Every Day Smoker    Packs/day: 1.00    Years: 50.00    Pack years: 50.00    Types: Cigarettes  . Smokeless tobacco: Never Used  Substance Use Topics  . Alcohol use: Yes    Alcohol/week: 35.0 standard drinks    Types: 35 Cans of beer per week    Comment: 4-5 beers every day    FAMILY HISTORY:   Family History  Problem Relation Age of Onset  . Cancer  Mother   . Cancer Father     DRUG ALLERGIES:   Allergies  Allergen Reactions  . Codeine Itching  . Cyclobenzaprine Itching  . Penicillins     itching    REVIEW OF SYSTEMS:   ROS As per history of present illness. All pertinent systems were reviewed above. Constitutional, HEENT, cardiovascular, respiratory, GI, GU, musculoskeletal, neuro, psychiatric, endocrine, integumentary and hematologic systems were reviewed and are otherwise negative/unremarkable except for positive findings mentioned above in the HPI.   MEDICATIONS AT HOME:   Prior to Admission medications   Medication Sig Start Date  End Date Taking? Authorizing Provider  albuterol (PROVENTIL) (2.5 MG/3ML) 0.083% nebulizer solution Take 3 mLs (2.5 mg total) by nebulization every 6 (six) hours as needed for wheezing or shortness of breath. 05/06/20  Yes Margaretann LovelessBurnette, Jennifer M, PA-C  aspirin 325 MG tablet Take 1 tablet (325 mg total) by mouth daily. 01/02/19  Yes Margaretann LovelessBurnette, Jennifer M, PA-C  atorvastatin (LIPITOR) 40 MG tablet Take 1 tablet (40 mg total) by mouth daily. 08/11/20  Yes Margaretann LovelessBurnette, Jennifer M, PA-C  diltiazem (CARDIZEM) 120 MG tablet Take 1 tablet (120 mg total) by mouth daily. 08/11/20  Yes Margaretann LovelessBurnette, Jennifer M, PA-C  dorzolamide (TRUSOPT) 2 % ophthalmic solution Place 1 drop into both eyes 3 (three) times daily. Patient taking differently: Place 1 drop into both eyes 2 (two) times daily.  01/02/19  Yes Margaretann LovelessBurnette, Jennifer M, PA-C  Fluticasone-Salmeterol (ADVAIR DISKUS) 250-50 MCG/DOSE AEPB USE 1 INHALATION TWICE A DAY 02/03/20  Yes Margaretann LovelessBurnette, Jennifer M, PA-C  furosemide (LASIX) 20 MG tablet TAKE 1 TABLET DAILY 05/24/20  Yes Margaretann LovelessBurnette, Jennifer M, PA-C  HYDROcodone-acetaminophen (NORCO) 7.5-325 MG tablet Take 1 tablet by mouth every 6 (six) hours as needed for severe pain. Must last 30 days 09/05/20 10/05/20 Yes Delano MetzNaveira, Francisco, MD  Lactobacillus-Inulin (CULTURELLE ULTIMATE STRENGTH) CAPS Take 1 capsule by mouth daily. 09/04/20  Yes Salena SanerGonzalez, Carmen L, MD  latanoprost (XALATAN) 0.005 % ophthalmic solution Place 1 drop into both eyes at bedtime. 01/02/19  Yes Margaretann LovelessBurnette, Jennifer M, PA-C  levofloxacin (LEVAQUIN) 500 MG tablet Take 1 tablet (500 mg total) by mouth daily. 09/04/20  Yes Salena SanerGonzalez, Carmen L, MD  magnesium gluconate (MAGONATE) 500 MG tablet Take 500 mg by mouth daily.   Yes [provider]  meloxicam (MOBIC) 15 MG tablet TAKE 1 TABLET DAILY 05/24/20  Yes Joycelyn ManBurnette, Jennifer M, PA-C  metroNIDAZOLE (FLAGYL) 500 MG tablet Take 1 tablet (500 mg total) by mouth 3 (three) times daily for 10 days. 09/04/20 09/15/20 Yes  Salena SanerGonzalez, Carmen L, MD  moxifloxacin (AVELOX) 400 MG tablet Take 1 tablet (400 mg total) by mouth daily at 8 pm. 09/04/20  Yes Salena SanerGonzalez, Carmen L, MD  Multiple Vitamin (MULTI-VITAMIN) tablet Take 1 tablet by mouth daily.    Yes [provider]  omeprazole (PRILOSEC) 40 MG capsule Take 1 capsule (40 mg total) by mouth daily. 09/04/20  Yes Salena SanerGonzalez, Carmen L, MD  pregabalin (LYRICA) 150 MG capsule Take 1 capsule (150 mg total) by mouth every 8 (eight) hours. 08/06/20 02/02/21 Yes Delano MetzNaveira, Francisco, MD  ramipril (ALTACE) 10 MG capsule TAKE 1 CAPSULE DAILY 04/02/20  Yes Margaretann LovelessBurnette, Jennifer M, PA-C  rOPINIRole (REQUIP) 2 MG tablet Take 1 tablet (2 mg total) by mouth 2 (two) times daily. 06/15/20  Yes Margaretann LovelessBurnette, Jennifer M, PA-C  spironolactone (ALDACTONE) 25 MG tablet Take 1 tablet (25 mg total) by mouth daily. 08/11/20  Yes Margaretann LovelessBurnette, Jennifer M, PA-C  vitamin B-12 (CYANOCOBALAMIN) 500 MCG tablet  Take 1 tablet (500 mcg total) by mouth daily. 01/02/19  Yes Margaretann Loveless, PA-C  HYDROcodone-acetaminophen (NORCO) 7.5-325 MG tablet Take 1 tablet by mouth every 6 (six) hours as needed for severe pain. Must last 30 days 08/06/20 09/05/20  Delano Metz, MD  HYDROcodone-acetaminophen (NORCO) 7.5-325 MG tablet Take 1 tablet by mouth every 6 (six) hours as needed for severe pain. Must last 30 days Patient not taking: Reported on 09/04/2020 10/05/20 11/04/20  Delano Metz, MD  linaclotide [facility]) 145 MCG CAPS capsule Take 1 capsule (145 mcg total) by mouth daily before breakfast. Patient not taking: Reported on 09/04/2020 08/07/20   Margaretann Loveless, PA-C      VITAL SIGNS:  Blood pressure 108/66, pulse 89, temperature 98 F (36.7 C), temperature source Oral, resp. rate 18, height 4\' 10"  (1.473 m), weight 39.5 kg, SpO2 100 %.  PHYSICAL EXAMINATION:  Physical Exam  GENERAL:  74 y.o.-year-old Caucasian female patient lying in the bed with no acute distress.  EYES: Pupils equal,  round, reactive to light and accommodation. No scleral icterus. Extraocular muscles intact.  HEENT: Head atraumatic, normocephalic. Oropharynx and nasopharynx clear.  NECK:  Supple, no jugular venous distention. No thyroid enlargement, no tenderness.  LUNGS: Mild diminished bibasal and right upper lung zone breath sounds with expiratory rhonchi and wheezes and positive right upper lung zone bronchophony. CARDIOVASCULAR: Regular rate and rhythm, S1, S2 normal. No murmurs, rubs, or gallops.  ABDOMEN: Soft, nondistended, nontender. Bowel sounds present. No organomegaly or mass.  EXTREMITIES: No pedal edema, cyanosis, or clubbing.  NEUROLOGIC: Cranial nerves II through XII are intact. Muscle strength 5/5 in all extremities. Sensation intact. Gait not checked.  PSYCHIATRIC: The patient is alert and oriented x 3.  Normal affect and good eye contact. SKIN: No obvious rash, lesion, or ulcer.   LABORATORY PANEL:   CBC Recent Labs  Lab 09/14/20 1551  WBC 12.4*  HGB 12.1  HCT 33.9*  PLT 378   ------------------------------------------------------------------------------------------------------------------  Chemistries  Recent Labs  Lab 09/14/20 1551 09/14/20 1852  NA 130*  --   K 3.3*  --   CL 101  --   CO2 23  --   GLUCOSE 92  --   BUN 11  --   CREATININE 0.79  --   CALCIUM 7.9*  --   MG  --  1.7   ------------------------------------------------------------------------------------------------------------------  Cardiac Enzymes No results for input(s): TROPONINI in the last 168 hours. ------------------------------------------------------------------------------------------------------------------  RADIOLOGY:  DG Chest 2 View  Result Date: 09/14/2020 CLINICAL DATA:  [place]. EXAM: CHEST - 2 VIEW COMPARISON:  PET CT November 9 21. FINDINGS: When comparing across modalities to PET-CT from September 01, 2020, apparent continued interval increase in size of a masslike area of  consolidation with central cavitation in the right upper lobe with air-fluid level. Surrounding opacities. Similar cardiomediastinal silhouette. No visible pleural effusions or pneumothorax. No acute osseous abnormality. IMPRESSION: When comparing across modalities to PET-CT from September 01, 2020, apparent continued interval increase in size of a masslike area of consolidation with central cavitation in the right upper lobe with air-fluid level. As noted on prior PET-CT, findings raise concern for cavitary pneumonia (possibly with developing pulmonary abscess). Progressive necrotic bronchogenic carcinoma is not excluded. Dedicated chest CT with contrast could better evaluate and allow for more direct comparison, if clinically indicated. Findings discussed with Dr. September 03, 2020 At 4:42 via telephone. Electronically Signed   By: Marisa Severin MD   On: 09/14/2020 16:48      IMPRESSION  AND PLAN:   1.  Right upper lung cavitary mass concerning for cavitary pneumonia with associated pulmonary abscess.  Bronchogenic carcinoma is in the differential diagnosis.  This could be associated with aspiration pneumonia due to dysphagia. -The patient will be admitted to a stepdown unit bed. -Continue antibiotic therapy with IV meropenem, vancomycin and Zithromax. -IV meropenem should cover for associated aspiration. -Mucolytic therapy and bronchodilator therapy will be provided. -O2 protocol will be followed. -We will follow sputum and blood cultures. -Pulmonary/intensivist consult will be obtained. -Dr. Jayme Cloud and ICU team were notified about the patient.  2.  Atrial fibrillation  with rapid ventricle response. -The patient was placed on IV amiodarone with bolus and drip and was given 1 dose of IV Cardizem. -She later converted to normal sinus rhythm. -We will continue her p.o. Cardizem while monitoring her blood pressure. -TSH level will be obtained.  Potassium and magnesium will be optimized. -2D echo and  cardiac consult to be obtained. -I notified Dr. Welton Flakes about the patient.  3.  Syncope. -This is like secondary to paroxysms of atrial fibrillation with RVR. -She will be monitored for recurrence and further arrhythmias. -We will check orthostatics every 12 hours. -She will be hydrated with IV normal saline. -A 2D echo be obtained as mentioned above.  4.  Hyponatremia. -This likely hypovolemic. -She will be hydrated with IV normal saline and will follow her sodium levels.  5.  Hypokalemia. -Potassium will be replaced and magnesium will be optimized as this could be contributing to her atrial fibrillation.  6.  Dyslipidemia. -Statin therapy will be resumed.  7.  COPD. -The patient will be placed on scheduled and as needed duo nebs.  8.  DVT prophylaxis. Subcutaneous Lovenox.    All the records are reviewed and case discussed with ED provider. The plan of care was discussed in details with the patient (and family). I answered all questions. The patient agreed to proceed with the above mentioned plan. Further management will depend upon hospital course.   CODE STATUS: Full code  Status is: Inpatient  Remains inpatient appropriate because:Ongoing diagnostic testing needed not appropriate for outpatient work up, Unsafe d/c plan, IV treatments appropriate due to intensity of illness or inability to take PO and Inpatient level of care appropriate due to severity of illness   Dispo: The patient is from: Home              Anticipated d/c is to: Home              Anticipated d/c date is: 3 days              Patient currently is not medically stable to d/c.   TOTAL TIME TAKING CARE OF THIS PATIENT: 65 minutes.    Hannah Beat M.D on 09/15/2020 at 4:44 AM  [facility]   From 7 PM-7 AM, contact night-coverage [email or web address]  CC: Primary care physician; Margaretann Loveless, PA-C

## 2020-09-15 NOTE — Progress Notes (Signed)
PHARMACIST - PHYSICIAN COMMUNICATION  CONCERNING:  Enoxaparin (Lovenox) for DVT Prophylaxis    RECOMMENDATION: Patient was prescribed enoxaprin 40mg  q24 hours for VTE prophylaxis.   Filed Weights   09/14/20 1547  Weight: 39.5 kg (87 lb)    Body mass index is 18.18 kg/m.  Estimated Creatinine Clearance: 37.9 mL/min (by C-G formula based on SCr of 0.79 mg/dL).  Patient is candidate for enoxaparin 30mg  every 24 hours based on CrCl <72ml/min or Weight <45kg  DESCRIPTION: Pharmacy has adjusted enoxaparin dose per [facility] policy.  Patient is now receiving enoxaparin 30 mg every 24 hours    31m, PharmD Clinical Pharmacist  09/15/2020 5:16 AM

## 2020-09-15 NOTE — Progress Notes (Signed)
*  PRELIMINARY RESULTS* Echocardiogram 2D Echocardiogram has been performed.  Suzanne Avila 09/15/2020, 8:58 AM

## 2020-09-15 NOTE — ED Notes (Signed)
Pt connected to cardiac monitor. Heart rate at 173. EKG performed. MD notified.

## 2020-09-15 NOTE — ED Provider Notes (Signed)
While patient is awaiting inpatient bed was noted by bedside staff to have increasing heart rate.  ECG obtained is remarkable for A. fib with RVR.  In addition patient's blood pressures did downtrend while the emergency room and and at time of signout plan is to give patient a second liter of IV fluids and reassess.  Given patient's blood pressures were still on the softer side after second liter of IV fluids with systolics measured in the 80s patient was started on amiodarone for her A. fib which seems to be a new diagnosis as patient denies any history of this.  I did attempt to call on-call cardiologist but pages x2 were not returned.   Gilles Chiquito, MD 09/15/20 [phone]

## 2020-09-15 NOTE — Consult Note (Addendum)
NAME:  Suzanne Avila, MRN:  [id number], DOB:  Jan 20, 1946, LOS: 1 ADMISSION DATE:  09/14/2020, CONSULTATION DATE: 09/15/2020 REFERRING MD: Dr. Arville Care, CHIEF COMPLAINT: Loss of Consciousness   Brief History   74 yo female recently dx with lung abscess admitted with syncopal episode secondary to atrial fibrillation with rvr and hypotension  History of present illness   This is a 74 yo female who presented to [facility] ER on 11/22 following a syncopal episode at home.  Per ER notes the pt stated she was attempting to get on or off of a stool, and woke up laying on her left side.  Upon arrival to the ER pt alert/oriented and vital signs initially stable.  Pt states she has had poor po intake due to loss of appetite, which she believes is related to her newly prescribed medications.  Pt also states she has been having difficulty swallowing medications.  ER lab results revealed Na+ 130, K+ 3.3, cbc 12.4, and UA positive for leukocytes.  COVID-19/Influenza PCR negative, however CXR revealed increasing size of cavitary mass seen in the right upper lobe concerning for lung abscess.  CXR findings initially diagnosed 11/12, pt prescribed Avelox 400 mg daily (see details below).  While in the ER pt received iv fluid resuscitation, ceftriaxone, and metronidazole.  Hospitalist team initially admitted pt to the medsurg unit for additional workup and treatment but remained in the ER pending bed availability.  However, she later developed atrial fibrillation with rvr along with hypotension requiring amiodarone gtt and admission to the stepdown unit.   Pt recently diagnosed with a lung abscess vs. RUL cavitary mass with signs of aspiration secondary to significant dysphagia on 11/12. Per pulmonologist Dr. Francene Boyers plan of care was to optimize her lung function with Breztri 2 puffs bid and discontinue advair.  For aspiration secondary to dysphagia pt instructed to take prilosec 40 mg daily and follow-up with GI (pt a no show  for scheduled appointment).  For potential lung abscess pt prescribed Avelox 400 mg daily, and instructed to take culturelle ultimate strength 1 capsule daily while on antibiotics and for at least a week afterwards.  Pt also followed by Oncologist Dr. Smith Robert.    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  PCCM team consulted to assist with managment  Consults:  Intensivist  Cardiology   Procedures:  None   Significant Diagnostic Tests:  CXR 11/22>>interval increase in size of a masslike area of consolidation with central cavitation in the right upper lobe with air-fluid level. As noted on prior PET-CT, findings raise concern for cavitary pneumonia (possibly with developing pulmonary abscess). Progressive necrotic bronchogenic carcinoma is not excluded  Micro Data:  Blood x2 11/22>> COVID-19/Influenza PCR 11/22>>negative  MRSA PCR 11/23>> Urine 11/23>>  Antimicrobials:  Ceftriaxone x1 dose 11/22 Metronidazole x1 dose 11/23 Meropenum 11/23>> Vancomycin 11/23>>  Interim history/subjective:  Pt resting in bed no complaints at this time.  Remains in atrial fibrillation with rvr on amiodarone gtt with map >65.   Objective   Blood  pressure 90/61, pulse (!) 58, temperature 98.2 F (36.8 C), temperature source Oral, resp. rate 19, height 4\' 10"  (1.473 m), weight 39.5 kg, SpO2 100 %.       No intake or output data in the 24 hours  ending 09/15/20 0137 Filed Weights   09/14/20 1547  Weight: 39.5 kg   Examination: General: chronically ill, frail appearing female resting in bed NAD  HENT: oral thrush present, no JVD  Lungs: expiratory wheezes throughout, even, non labored Cardiovascular: irregular irregular, no R/G Abdomen: +BS x4, soft, non tender, non distended  Extremities: moves all extremities  Neuro: alert and oriented, follows commands, PERRLA  Assessment & Plan:  RUL cavitary mass: lung abscess Chronic aspiration secondary to significant dysphagia  Hx: Current everyday smoker and COPD  Supplemental O2 for dyspnea and/or hypoxia  Scheduled and prn bronchodilator therapy  Smoking cessation counseling provided  Trend WBC and monitor fever curve  Trend PCT  Follow cultures  Continue abx therapy as outlined above  Pt will need to follow-up to GI for evaluation of dysphagia   New onset atrial fibrillation with rvr  Hypotension likely in the setting of atrial fibrillation and volume depletion secondary to poor po intake and chronic diarrhea Syncopal episode secondary to hypotension  Hx: HTN, HLD Continuous telemetry monitoring  Continue amiodarone gtt for now  Fluid resuscitation to maintain map >65 Hold outpatient antihypertensives for now  Cardiology consulted appreciate input  Echo pending  Encourage po intake   Hyponatremia  Hypokalemia  Trend BMP  Replace electrolytes as indicated  Monitor UOP Avoid nephrotoxic medications  Continue fluid resuscitation   Dysphagia  GERD  Pt will need to follow-up to GI for evaluation  Speech evaluation pending  Maintain aspiration precautions  IV protonix   Chronic pain syndrome Prn morphine for pain management.  Will hold outpatient po pain medication regimen pending speech evaluation due to dysphagia   ETOH abuse CIWA protocol   Best practice:  Diet: Will keep NPO for now pending swallow evaluation  Pain/Anxiety/Delirium protocol (if  indicated): Prn morphine for pain management  VAP protocol (if indicated): not indicated  DVT prophylaxis: Subq lovenox  GI prophylaxis: iv protonix  Glucose control: Not indicated; will monitor CBG's q4hrs  Mobility: Bedrest for now  Code Status: Full Code  Family Communication: Pt alert/oriented discussed plan of care with pt and all questions were answered  Disposition: Stepdown   Labs   CBC: Recent Labs  Lab 09/14/20 1551  WBC 12.4*  HGB 12.1  HCT 33.9*  MCV 92.4  PLT 378    Basic Metabolic Panel: Recent Labs  Lab 09/14/20 1551  NA 130*  K 3.3*  CL 101  CO2 23  GLUCOSE 92  BUN 11  CREATININE 0.79  CALCIUM 7.9*   GFR: Estimated Creatinine Clearance: 37.9 mL/min (by C-G formula based on SCr of 0.79 mg/dL). Recent Labs  Lab 09/14/20 1551 09/14/20 2139  WBC 12.4*  --   LATICACIDVEN  --  1.3    Liver Function Tests: No results for input(s): AST, ALT, ALKPHOS, BILITOT, PROT, ALBUMIN in the last 168 hours. No results for input(s): LIPASE, AMYLASE in the last 168 hours. No results for input(s): AMMONIA in the last 168 hours.  ABG No results found for: PHART, PCO2ART, PO2ART, HCO3, TCO2, ACIDBASEDEF, O2SAT   Coagulation Profile: No results for input(s): INR, PROTIME in the last 168 hours.  Cardiac Enzymes: No results for input(s): CKTOTAL, CKMB, CKMBINDEX, TROPONINI in the last 168 hours.  HbA1C:  No results found for: HGBA1C  CBG: No results for input(s): GLUCAP in the last 168 hours.  Review of Systems:   Gen: loss of appetite, fever, chills, unintentional weight loss, fatigue, night sweats HEENT: blurred vision, double vision, hearing loss, tinnitus, sinus congestion, rhinorrhea, sore throat, neck stiffness, dysphagia PULM: Denies shortness of breath, cough, sputum production, hemoptysis, wheezing CV: Denies chest pain, edema, orthopnea, paroxysmal nocturnal dyspnea, palpitations GI: Denies abdominal pain, nausea, vomiting, diarrhea, hematochezia,  melena, constipation, change in bowel habits GU: Denies dysuria, hematuria, polyuria, oliguria, urethral discharge Endocrine: Denies hot or cold intolerance, polyuria, polyphagia or appetite change Derm: Denies rash, dry skin, scaling or peeling skin change Heme: Denies easy bruising, bleeding, bleeding gums Neuro: headache, numbness, weakness, slurred speech, loss of memory or consciousness  Past Medical History  She,  has a past medical history of Closed left arm fracture, COPD (chronic obstructive pulmonary disease) (HCC), Glaucoma, Hyperlipidemia, Hypertension, and Lung mass.   Surgical History    Past Surgical History:  Procedure Laterality Date   BACK SURGERY     CARPAL TUNNEL RELEASE Left    EYE SURGERY       Social History   reports that she has been smoking cigarettes. She has a 50.00 pack-year smoking history. She has never used smokeless tobacco. She reports current alcohol use of about 35.0 standard drinks of alcohol per week. She reports that she does not use drugs.   Family History   Her family history includes Cancer in her father and mother.   Allergies Allergies  Allergen Reactions   Codeine Itching   Cyclobenzaprine Itching   Penicillins     itching     Home Medications  Prior to Admission medications   Medication Sig Start Date End Date Taking? Authorizing Provider  albuterol (PROVENTIL) (2.5 MG/3ML) 0.083% nebulizer solution Take 3 mLs (2.5 mg total) by nebulization every 6 (six) hours as needed for wheezing or shortness of breath. 05/06/20  Yes Margaretann Loveless, PA-C  aspirin 325 MG tablet Take 1 tablet (325 mg total) by mouth daily. 01/02/19  Yes Margaretann Loveless, PA-C  atorvastatin (LIPITOR) 40 MG tablet Take 1 tablet (40 mg total) by mouth daily. 08/11/20  Yes Margaretann Loveless, PA-C  diltiazem (CARDIZEM) 120 MG tablet Take 1 tablet (120 mg total) by mouth daily. 08/11/20  Yes Margaretann Loveless, PA-C  dorzolamide (TRUSOPT) 2 %  ophthalmic solution Place 1 drop into both eyes 3 (three) times daily. Patient taking differently: Place 1 drop into both eyes 2 (two) times daily.  01/02/19  Yes Margaretann Loveless, PA-C  Fluticasone-Salmeterol (ADVAIR DISKUS) 250-50 MCG/DOSE AEPB USE 1 INHALATION TWICE A DAY 02/03/20  Yes Margaretann Loveless, PA-C  furosemide (LASIX) 20 MG tablet TAKE 1 TABLET DAILY 05/24/20  Yes Margaretann Loveless, PA-C  HYDROcodone-acetaminophen (NORCO) 7.5-325 MG tablet Take 1 tablet by mouth every 6 (six) hours as needed for severe pain. Must last 30 days 09/05/20 10/05/20 Yes Delano Metz, MD  Lactobacillus-Inulin (CULTURELLE ULTIMATE STRENGTH) CAPS Take 1 capsule by mouth daily. 09/04/20  Yes Salena Saner, MD  latanoprost (XALATAN) 0.005 % ophthalmic solution Place 1 drop into both eyes at bedtime. 01/02/19  Yes Margaretann Loveless, PA-C  levofloxacin (LEVAQUIN) 500 MG tablet Take 1 tablet (500 mg total) by mouth daily. 09/04/20  Yes Salena Saner, MD  magnesium gluconate (MAGONATE) 500 MG tablet Take 500 mg by mouth daily.   Yes [provider]  meloxicam (MOBIC) 15 MG tablet  TAKE 1 TABLET DAILY 05/24/20  Yes Margaretann LovelessBurnette, Jennifer M, PA-C  metroNIDAZOLE (FLAGYL) 500 MG tablet Take 1 tablet (500 mg total) by mouth 3 (three) times daily for 10 days. 09/04/20 09/15/20 Yes Salena SanerGonzalez, Carmen L, MD  moxifloxacin (AVELOX) 400 MG tablet Take 1 tablet (400 mg total) by mouth daily at 8 pm. 09/04/20  Yes Salena SanerGonzalez, Carmen L, MD  Multiple Vitamin (MULTI-VITAMIN) tablet Take 1 tablet by mouth daily.    Yes [provider]  omeprazole (PRILOSEC) 40 MG capsule Take 1 capsule (40 mg total) by mouth daily. 09/04/20  Yes Salena SanerGonzalez, Carmen L, MD  pregabalin (LYRICA) 150 MG capsule Take 1 capsule (150 mg total) by mouth every 8 (eight) hours. 08/06/20 02/02/21 Yes Delano MetzNaveira, Francisco, MD  ramipril (ALTACE) 10 MG capsule TAKE 1 CAPSULE DAILY 04/02/20  Yes Margaretann LovelessBurnette, Jennifer M, PA-C  rOPINIRole (REQUIP)  2 MG tablet Take 1 tablet (2 mg total) by mouth 2 (two) times daily. 06/15/20  Yes Margaretann LovelessBurnette, Jennifer M, PA-C  spironolactone (ALDACTONE) 25 MG tablet Take 1 tablet (25 mg total) by mouth daily. 08/11/20  Yes Margaretann LovelessBurnette, Jennifer M, PA-C  vitamin B-12 (CYANOCOBALAMIN) 500 MCG tablet Take 1 tablet (500 mcg total) by mouth daily. 01/02/19  Yes Margaretann LovelessBurnette, Jennifer M, PA-C  HYDROcodone-acetaminophen (NORCO) 7.5-325 MG tablet Take 1 tablet by mouth every 6 (six) hours as needed for severe pain. Must last 30 days 08/06/20 09/05/20  Delano MetzNaveira, Francisco, MD  HYDROcodone-acetaminophen (NORCO) 7.5-325 MG tablet Take 1 tablet by mouth every 6 (six) hours as needed for severe pain. Must last 30 days Patient not taking: Reported on 09/04/2020 10/05/20 11/04/20  Delano MetzNaveira, Francisco, MD  linaclotide [employer] [facility](LINZESS) 145 MCG CAPS capsule Take 1 capsule (145 mcg total) by mouth daily before breakfast. Patient not taking: Reported on 09/04/2020 08/07/20   Margaretann LovelessBurnette, Jennifer M, PA-C     Sonda Rumbleana Kendalyn Cranfield, AGNP  Pulmonary/Critical Care Pager [phone] (please enter 7 digits) PCCM Consult Pager [phone] (please enter 7 digits)

## 2020-09-15 NOTE — Telephone Encounter (Signed)
Patient's son, Rosanne Ashing [facility]) is aware of below message and voiced his understanding.  Nothing further needed.

## 2020-09-15 NOTE — Progress Notes (Signed)
SLP Cancellation Note  Patient Details Name: Suzanne Avila MRN: [id number] DOB: 03/17/1946   Cancelled treatment:       Reason Eval/Treat Not Completed: SLP screened, no needs identified, will sign off; Per RN report, patient consuming current regular/thin liquid diet without observed difficulty or concerns. Patient also passed [employer] Screen upon admission. At this time, skilled ST services to evaluate swallowing function not indicated at this time. MD/RN to refer for BSE if swallowing function changes during hospital stay.  Everlean Patterson, M.S. CCC-SLP Speech-Language Pathologist  Everlean Patterson 09/15/2020, 10:03 AM

## 2020-09-15 NOTE — ED Notes (Signed)
Pt changed by this RN and Penni Bombard, RN at this time. New brief and chux applied. No further needs noted.

## 2020-09-15 NOTE — Progress Notes (Signed)
PROGRESS NOTE    Suzanne HoopsJanet M Avila  ZOX:[id number] DOB: 27-May-1946 DOA: 09/14/2020 PCP: Margaretann LovelessBurnette, Jennifer M, PA-C   Chief complaint.  Syncope. Brief Narrative:  Suzanne Avila  is a 74 y.o. Caucasian female with a known history of COPD, glaucoma, hypertension, dyslipidemia and cavitary right upper lobe mass, who presented to the emergency room with acute onset of syncope while getting off of a barstool with subsequent fall to the left side without injuries. Patient also has been complaining of dysphagia for few month, recently swallowing has been getting better.  She also had a significant weight loss over the last 6 months.  She also has chronic diarrhea. Upon arriving the emergency room, patient was hypotensive, atrial fibrillation with RVR with mild elevation of troponin.  Heart rate is better. Chest x-ray showed right upper lobe cavitary mass that has increased in size compared to previous PET scan.  She is treated with meropenem for possible infection.  She was also started antifungal agent by pulmonology.   Assessment & Plan:   Active Problems:   Abscess of right lung with pneumonia (HCC)  #1.  Right upper lobe cavitary mass.  Differential including abscess, bronchogenic carcinoma. Patient covered with meropenem, vancomycin and Zithromax.  I will discontinue vancomycin and Zithromax for now.  Patient was also covered for antifungal agent by pulmonology. I spoke with Dr. Meredeth IdeFleming, he will see the patient today.  Most likely bronchoscopy as outpatient in 1 week time.  Patient can be treated with oral agent when condition is more stable.  2.  Paroxysmal atrial fibrillation with rapid ventricular response. Patient received IV diltiazem. Pending echocardiogram. Spoke with Dr. Welton FlakesKhan, we will discontinue diltiazem, continue amiodarone drip.  #3.  Failure to thrive. Severe protein calorie malnutrition. Chronic diarrhea. Weight loss. Dysphagia. Patient has been losing weight for the last  6 months, currently her BMI is only 18.  She has significant muscle atrophy.  She has severe malnutrition. For dysphagia, will obtain barium esophagram. Chronic diarrhea most likely secondary to malabsorption.  Check a C. difficile toxin.  Start pancreatic enzymes. Nutrition supplement will also be started.  #4.  Chronic anemia. Most likely due to no absorption.  Check iron B12 level.  #5.  Hypokalemia, hypomagnesemia, mild hypophosphatemia. Supplement.    #6. Syncope with hypotension Likely due to volume depletion. Received fluids. Check cortisol level.  #7.  Hyponatremia. Sodium level is better.  8.  COPD. Stable.      DVT prophylaxis: Lovenox Code Status: Full Family Communication: husband updated Disposition Plan:  .   Status is: Inpatient  Remains inpatient appropriate because:Inpatient level of care appropriate due to severity of illness   Dispo: The patient is from: Home              Anticipated d/c is to: Home              Anticipated d/c date is: 2 days              Patient currently is not medically stable to d/c.        I/O last 3 completed shifts: In: 1100 [IV Piggyback:1100] Out: -  Total I/O In: 966.9 [I.V.:695.3; IV Piggyback:271.6] Out: -      Consultants:   pulmonary  Procedures: none  Antimicrobials:  Meropenem.  Subjective: Patient blood pressure is more stable today.  She does not feel any dizziness or lightheadedness.  She still has significant diarrhea, she has a cough, every time she coughs, she has some loose  stools.  Her cough is largely nonproductive.  She has short of breath with exertion. She has a poor appetite, but no nausea vomiting today.  No abdominal pain. No fever chills. No dysuria hematuria.  Objective: Vitals:   09/15/20 0232 09/15/20 0300 09/15/20 0700 09/15/20 0800  BP:  108/66 115/78 125/79  Pulse:  89 83 90  Resp:  18 (!) 24 (!) 21  Temp: 98 F (36.7 C)   97.9 F (36.6 C)  TempSrc: Oral   Oral   SpO2:  100% 98% 99%  Weight:      Height:        Intake/Output Summary (Last 24 hours) at 09/15/2020 0916 Last data filed at 09/15/2020 0800 Gross per 24 hour  Intake 2066.91 ml  Output --  Net 2066.91 ml   Filed Weights   09/14/20 1547  Weight: 39.5 kg    Examination:  General exam: Appears calm and comfortable, severely malnourished with muscle atrophy. Respiratory system: Clear to auscultation. Respiratory effort normal. Cardiovascular system: Irregular.  No JVD, murmurs, rubs, gallops or clicks. No pedal edema. Gastrointestinal system: Abdomen is nondistended, soft and nontender. No organomegaly or masses felt. Normal bowel sounds heard. Central nervous system: Alert and oriented. No focal neurological deficits. Extremities: Symmetric  Skin: No rashes, lesions or ulcers Psychiatry:  Mood & affect appropriate.     Data Reviewed: I have personally reviewed following labs and imaging studies  CBC: Recent Labs  Lab 09/14/20 1551 09/15/20 0702  WBC 12.4* 12.5*  HGB 12.1 9.9*  HCT 33.9* 27.5*  MCV 92.4 90.2  PLT 378 321   Basic Metabolic Panel: Recent Labs  Lab 09/14/20 1551 09/14/20 1852 09/15/20 0702  NA 130*  --  134*  K 3.3*  --  3.4*  CL 101  --  108  CO2 23  --  16*  GLUCOSE 92  --  87  BUN 11  --  11  CREATININE 0.79  --  0.86  CALCIUM 7.9*  --  7.6*  MG  --  1.7 1.4*  PHOS  --   --  2.2*   GFR: Estimated Creatinine Clearance: 35.2 mL/min (by C-G formula based on SCr of 0.86 mg/dL). Liver Function Tests: No results for input(s): AST, ALT, ALKPHOS, BILITOT, PROT, ALBUMIN in the last 168 hours. No results for input(s): LIPASE, AMYLASE in the last 168 hours. No results for input(s): AMMONIA in the last 168 hours. Coagulation Profile: Recent Labs  Lab 09/15/20 0702  INR 1.3*   Cardiac Enzymes: No results for input(s): CKTOTAL, CKMB, CKMBINDEX, TROPONINI in the last 168 hours. BNP (last 3 results) No results for input(s): PROBNP in the last  8760 hours. HbA1C: No results for input(s): HGBA1C in the last 72 hours. CBG: Recent Labs  Lab 09/15/20 0332  GLUCAP 91   Lipid Profile: No results for input(s): CHOL, HDL, LDLCALC, TRIG, CHOLHDL, LDLDIRECT in the last 72 hours. Thyroid Function Tests: No results for input(s): TSH, T4TOTAL, FREET4, T3FREE, THYROIDAB in the last 72 hours. Anemia Panel: No results for input(s): VITAMINB12, FOLATE, FERRITIN, TIBC, IRON, RETICCTPCT in the last 72 hours. Sepsis Labs: Recent Labs  Lab 09/14/20 2139 09/15/20 0702  PROCALCITON  --  0.10  LATICACIDVEN 1.3 1.1    Recent Results (from the past 240 hour(s))  Resp Panel by RT-PCR (Flu A&B, Covid) Nasopharyngeal Swab     Status: None   Collection Time: 09/14/20 10:47 PM   Specimen: Nasopharyngeal Swab; Nasopharyngeal(NP) swabs in vial transport medium  Result  Value Ref Range Status   SARS Coronavirus 2 by RT PCR NEGATIVE NEGATIVE Final    Comment: (NOTE) SARS-CoV-2 target nucleic acids are NOT DETECTED.  The SARS-CoV-2 RNA is generally detectable in upper respiratory specimens during the acute phase of infection. The lowest concentration of SARS-CoV-2 viral copies this assay can detect is 138 copies/mL. A negative result does not preclude SARS-Cov-2 infection and should not be used as the sole basis for treatment or other patient management decisions. A negative result may occur with  improper specimen collection/handling, submission of specimen other than nasopharyngeal swab, presence of viral mutation(s) within the areas targeted by this assay, and inadequate number of viral copies(<138 copies/mL). A negative result must be combined with clinical observations, patient history, and epidemiological information. The expected result is Negative.  Fact Sheet for Patients:  BloggerCourse.com  Fact Sheet for Healthcare Providers:  SeriousBroker.it  This test is no t yet approved or  cleared by the [place] FDA and  has been authorized for detection and/or diagnosis of SARS-CoV-2 by FDA under an Emergency Use Authorization (EUA). This EUA will remain  in effect (meaning this test can be used) for the duration of the COVID-19 declaration under Section 564(b)(1) of the Act, 21 U.S.C.section 360bbb-3(b)(1), unless the authorization is terminated  or revoked sooner.       Influenza A by PCR NEGATIVE NEGATIVE Final   Influenza B by PCR NEGATIVE NEGATIVE Final    Comment: (NOTE) The Xpert Xpress SARS-CoV-2/FLU/RSV plus assay is intended as an aid in the diagnosis of influenza from Nasopharyngeal swab specimens and should not be used as a sole basis for treatment. Nasal washings and aspirates are unacceptable for Xpert Xpress SARS-CoV-2/FLU/RSV testing.  Fact Sheet for Patients: BloggerCourse.com  Fact Sheet for Healthcare Providers: SeriousBroker.it  This test is not yet approved or cleared by the [place] FDA and has been authorized for detection and/or diagnosis of SARS-CoV-2 by FDA under an Emergency Use Authorization (EUA). This EUA will remain in effect (meaning this test can be used) for the duration of the COVID-19 declaration under Section 564(b)(1) of the Act, 21 U.S.C. section 360bbb-3(b)(1), unless the authorization is terminated or revoked.  Performed at [facility], [address] [place], [place] [address]   Blood culture (routine x 2)     Status: None (Preliminary result)   Collection Time: 09/14/20 10:47 PM   Specimen: BLOOD  Result Value Ref Range Status   Specimen Description BLOOD BLOOD RIGHT HAND  Final   Special Requests   Final    BOTTLES DRAWN AEROBIC AND ANAEROBIC Blood Culture results may not be optimal due to an excessive volume of blood received in culture bottles   Culture   Final    NO GROWTH < 12 HOURS Performed at [facility], [address], [place], [place] [address]    Report Status PENDING  Incomplete  Blood culture (routine x 2)     Status: None (Preliminary result)   Collection Time: 09/14/20 10:48 PM   Specimen: BLOOD  Result Value Ref Range Status   Specimen Description BLOOD RIGHT ANTECUBITAL  Final   Special Requests   Final    BOTTLES DRAWN AEROBIC AND ANAEROBIC Blood Culture adequate volume   Culture   Final    NO GROWTH < 12 HOURS Performed at [facility], [address] [place], [place] [address]    Report Status PENDING  Incomplete  MRSA PCR Screening     Status: None  Collection Time: 09/15/20  3:30 AM   Specimen: Nasopharyngeal  Result Value Ref Range Status   MRSA by PCR NEGATIVE NEGATIVE Final    Comment:        The GeneXpert MRSA Assay (FDA approved for NASAL specimens only), is one component of a comprehensive MRSA colonization surveillance program. It is not intended to diagnose MRSA infection nor to guide or monitor treatment for MRSA infections. Performed at [facility], [address], [place], [place] [address]          Radiology Studies: DG Chest 2 View  Result Date: 09/14/2020 CLINICAL DATA:  Dizzy. EXAM: CHEST - 2 VIEW COMPARISON:  PET CT November 9 21. FINDINGS: When comparing across modalities to PET-CT from September 01, 2020, apparent continued interval increase in size of a masslike area of consolidation with central cavitation in the right upper lobe with air-fluid level. Surrounding opacities. Similar cardiomediastinal silhouette. No visible pleural effusions or pneumothorax. No acute osseous abnormality. IMPRESSION: When comparing across modalities to PET-CT from September 01, 2020, apparent continued interval increase in size of a masslike area of consolidation with central cavitation in the right upper lobe with air-fluid level. As noted on prior PET-CT, findings raise concern for cavitary pneumonia (possibly with developing pulmonary abscess). Progressive  necrotic bronchogenic carcinoma is not excluded. Dedicated chest CT with contrast could better evaluate and allow for more direct comparison, if clinically indicated. Findings discussed with Dr. Marisa Severin At 4:42 via telephone. Electronically Signed   By: Feliberto Harts MD   On: 09/14/2020 16:48        Scheduled Meds: . Chlorhexidine Gluconate Cloth  6 each Topical Daily  . enoxaparin (LOVENOX) injection  30 mg Subcutaneous Q24H  . folic acid  1 mg Oral Daily  . guaiFENesin  600 mg Oral BID  . ipratropium-albuterol  3 mL Nebulization QID  . multivitamin with minerals  1 tablet Oral Daily  . pantoprazole (PROTONIX) IV  40 mg Intravenous q1800  . thiamine  100 mg Oral Daily   Or  . thiamine  100 mg Intravenous Daily   Continuous Infusions: . sodium chloride 100 mL/hr at 09/15/20 0800  . amiodarone 30 mg/hr (09/15/20 0800)  . anidulafungin    . meropenem (MERREM) IV 160 mL/hr at 09/15/20 0800     LOS: 1 day    Time spent: 45 minutes    Marrion Coy, MD [facility]   To contact the attending provider between 7A-7P or the covering provider during after hours 7P-7A, please log into the web site [email or web address] and access using universal [place] password for that web site. If you do not have the password, please call the hospital operator.  09/15/2020, 9:16 AM

## 2020-09-15 NOTE — Progress Notes (Addendum)
Pharmacy Antibiotic Note  Suzanne Avila is a 74 y.o. female admitted on 09/14/2020 with pneumonia.  Pharmacy has been consulted for Vancomycin and Meropenem dosing.  Fluconazole added for oropharyngeal candidiasis.  Plan: Vancomycin 1gm x 1 then 750mg  q24hrs (per nomogram) Meropenem 1gm IV q12hrs Fluconazole 200mg  x 1 then 100mg  IV q24hrs  Height: 4\' 10"  (147.3 cm) Weight: 39.5 kg (87 lb) IBW/kg (Calculated) : 40.9  Temp (24hrs), Avg:98.1 F (36.7 C), Min:98 F (36.7 C), Max:98.2 F (36.8 C)  Recent Labs  Lab 09/14/20 1551 09/14/20 2139  WBC 12.4*  --   CREATININE 0.79  --   LATICACIDVEN  --  1.3    Estimated Creatinine Clearance: 37.9 mL/min (by C-G formula based on SCr of 0.79 mg/dL).    Allergies  Allergen Reactions  . Codeine Itching  . Cyclobenzaprine Itching  . Penicillins     itching    Antimicrobials this admission:   >>    >>   Dose adjustments this admission:   Microbiology results:  BCx:   UCx:    Sputum:    MRSA PCR:   Thank you for allowing pharmacy to be a part of this patient's care.  A 09/15/2020 4:42 AM

## 2020-09-15 NOTE — ED Notes (Signed)
Cardizem held at this time. Pts HR appears to converted to NSR with PVCs.

## 2020-09-15 NOTE — Consult Note (Signed)
Suzanne Avila is a 74 y.o. female  [id number]  Primary Cardiologist: Adrian Blackwater Reason for Consultation: Atrial fibrillation  HPI: This is a 74 year old white female who presented to the hospital with dizziness weakness and had syncopal episode and was found to be in atrial fibrillation with rapid ventricular response rate.  Patient was started on amiodarone as well as Cardizem drip.  Right now patient is feeling much better and heart rate is around 90.   Review of Systems: No chest pain.   Past Medical History:  Diagnosis Date  . Closed left arm fracture   . COPD (chronic obstructive pulmonary disease) (HCC)   . Glaucoma   . Hyperlipidemia   . Hypertension   . Lung mass     Medications Prior to Admission  Medication Sig Dispense Refill  . albuterol (PROVENTIL) (2.5 MG/3ML) 0.083% nebulizer solution Take 3 mLs (2.5 mg total) by nebulization every 6 (six) hours as needed for wheezing or shortness of breath. 150 mL 1  . aspirin 325 MG tablet Take 1 tablet (325 mg total) by mouth daily. 90 tablet 3  . atorvastatin (LIPITOR) 40 MG tablet Take 1 tablet (40 mg total) by mouth daily. 90 tablet 0  . diltiazem (CARDIZEM) 120 MG tablet Take 1 tablet (120 mg total) by mouth daily. 90 tablet 0  . dorzolamide (TRUSOPT) 2 % ophthalmic solution Place 1 drop into both eyes 3 (three) times daily. (Patient taking differently: Place 1 drop into both eyes 2 (two) times daily. ) 10 mL 3  . Fluticasone-Salmeterol (ADVAIR DISKUS) 250-50 MCG/DOSE AEPB USE 1 INHALATION TWICE A DAY 180 each 3  . furosemide (LASIX) 20 MG tablet TAKE 1 TABLET DAILY 90 tablet 1  . HYDROcodone-acetaminophen (NORCO) 7.5-325 MG tablet Take 1 tablet by mouth every 6 (six) hours as needed for severe pain. Must last 30 days 120 tablet 0  . Lactobacillus-Inulin (CULTURELLE ULTIMATE STRENGTH) CAPS Take 1 capsule by mouth daily. 20 capsule 2  . latanoprost (XALATAN) 0.005 % ophthalmic solution Place 1 drop into both eyes at  bedtime. 2.5 mL 3  . levofloxacin (LEVAQUIN) 500 MG tablet Take 1 tablet (500 mg total) by mouth daily. 10 tablet 0  . magnesium gluconate (MAGONATE) 500 MG tablet Take 500 mg by mouth daily.    . meloxicam (MOBIC) 15 MG tablet TAKE 1 TABLET DAILY 90 tablet 1  . metroNIDAZOLE (FLAGYL) 500 MG tablet Take 1 tablet (500 mg total) by mouth 3 (three) times daily for 10 days. 30 tablet 0  . moxifloxacin (AVELOX) 400 MG tablet Take 1 tablet (400 mg total) by mouth daily at 8 pm. 10 tablet 0  . Multiple Vitamin (MULTI-VITAMIN) tablet Take 1 tablet by mouth daily.     Marland Kitchen omeprazole (PRILOSEC) 40 MG capsule Take 1 capsule (40 mg total) by mouth daily. 90 capsule 3  . pregabalin (LYRICA) 150 MG capsule Take 1 capsule (150 mg total) by mouth every 8 (eight) hours. 270 capsule 1  . ramipril (ALTACE) 10 MG capsule TAKE 1 CAPSULE DAILY 30 capsule 0  . rOPINIRole (REQUIP) 2 MG tablet Take 1 tablet (2 mg total) by mouth 2 (two) times daily. 180 tablet 2  . spironolactone (ALDACTONE) 25 MG tablet Take 1 tablet (25 mg total) by mouth daily. 90 tablet 0  . vitamin B-12 (CYANOCOBALAMIN) 500 MCG tablet Take 1 tablet (500 mcg total) by mouth daily. 90 tablet 3  . HYDROcodone-acetaminophen (NORCO) 7.5-325 MG tablet Take 1 tablet by mouth every 6 (  six) hours as needed for severe pain. Must last 30 days 120 tablet 0  . [START ON 10/05/2020] HYDROcodone-acetaminophen (NORCO) 7.5-325 MG tablet Take 1 tablet by mouth every 6 (six) hours as needed for severe pain. Must last 30 days (Patient not taking: Reported on 09/04/2020) 120 tablet 0  . linaclotide (LINZESS) 145 MCG CAPS capsule Take 1 capsule (145 mcg total) by mouth daily before breakfast. (Patient not taking: Reported on 09/04/2020) 90 capsule 3     . Chlorhexidine Gluconate Cloth  6 each Topical Daily  . enoxaparin (LOVENOX) injection  30 mg Subcutaneous Q24H  . feeding supplement  237 mL Oral BID BM  . folic acid  1 mg Oral Daily  . guaiFENesin  600 mg Oral BID   . ipratropium-albuterol  3 mL Nebulization QID  . lipase/protease/amylase  24,000 Units Oral TID AC  . multivitamin with minerals  1 tablet Oral Daily  . pantoprazole (PROTONIX) IV  40 mg Intravenous q1800  . potassium & sodium phosphates  1 packet Oral TID WC & HS  . potassium chloride  40 mEq Oral Once  . thiamine  100 mg Oral Daily   Or  . thiamine  100 mg Intravenous Daily    Infusions: . sodium chloride 100 mL/hr at 09/15/20 0800  . amiodarone 30 mg/hr (09/15/20 0800)  . anidulafungin     Followed by  . [START ON 09/16/2020] anidulafungin    . magnesium sulfate bolus IVPB    . meropenem (MERREM) IV 160 mL/hr at 09/15/20 0800    Allergies  Allergen Reactions  . Codeine Itching  . Cyclobenzaprine Itching  . Penicillins     itching    Social History   Socioeconomic History  . Marital status: Married    Spouse name: Not on file  . Number of children: 2  . Years of education: Not on file  . Highest education level: 12th grade  Occupational History  . Occupation: disabled  . Occupation: retired  Tobacco Use  . Smoking status: Current Every Day Smoker    Packs/day: 1.00    Years: 50.00    Pack years: 50.00    Types: Cigarettes  . Smokeless tobacco: Never Used  Vaping Use  . Vaping Use: Former  Substance and Sexual Activity  . Alcohol use: Yes    Alcohol/week: 35.0 standard drinks    Types: 35 Cans of beer per week    Comment: 4-5 beers every day  . Drug use: No  . Sexual activity: Not Currently  Other Topics Concern  . Not on file  Social History Narrative  . Not on file   Social Determinants of Health   Financial Resource Strain: Low Risk   . Difficulty of Paying Living Expenses: Not hard at all  Food Insecurity: No Food Insecurity  . Worried About Programme researcher, broadcasting/film/video in the Last Year: Never true  . Ran Out of Food in the Last Year: Never true  Transportation Needs: No Transportation Needs  . Lack of Transportation (Medical): No  . Lack of  Transportation (Non-Medical): No  Physical Activity: Inactive  . Days of Exercise per Week: 0 days  . Minutes of Exercise per Session: 0 min  Stress: No Stress Concern Present  . Feeling of Stress : Only a little  Social Connections: Moderately Isolated  . Frequency of Communication with Friends and Family: Three times a week  . Frequency of Social Gatherings with Friends and Family: Once a week  . Attends Religious  Services: Never  . Active Member of Clubs or Organizations: No  . Attends Banker Meetings: Never  . Marital Status: Married  Catering manager Violence: Not At Risk  . Fear of Current or Ex-Partner: No  . Emotionally Abused: No  . Physically Abused: No  . Sexually Abused: No    Family History  Problem Relation Age of Onset  . Cancer Mother   . Cancer Father     PHYSICAL EXAM: Vitals:   09/15/20 0700 09/15/20 0800  BP: 115/78 125/79  Pulse: 83 90  Resp: (!) 24 (!) 21  Temp:  97.9 F (36.6 C)  SpO2: 98% 99%     Intake/Output Summary (Last 24 hours) at 09/15/2020 1000 Last data filed at 09/15/2020 0800 Gross per 24 hour  Intake 2066.91 ml  Output --  Net 2066.91 ml    General:  Well appearing. No respiratory difficulty HEENT: normal Neck: supple. no JVD. Carotids 2+ bilat; no bruits. No lymphadenopathy or thryomegaly appreciated. Cor: PMI nondisplaced. Regular rate & rhythm. No rubs, gallops or murmurs. Lungs: clear Abdomen: soft, nontender, nondistended. No hepatosplenomegaly. No bruits or masses. Good bowel sounds. Extremities: no cyanosis, clubbing, rash, edema Neuro: alert & oriented x 3, cranial nerves grossly intact. moves all 4 extremities w/o difficulty. Affect pleasant.  ECG: Atrial fibrillation with rapid ventricular response rate but 160/min  Results for orders placed or performed during the hospital encounter of 09/14/20 (from the past 24 hour(s))  Basic metabolic panel     Status: Abnormal   Collection Time: 09/14/20  3:51  PM  Result Value Ref Range   Sodium 130 (L) 135 - 145 mmol/L   Potassium 3.3 (L) 3.5 - 5.1 mmol/L   Chloride 101 98 - 111 mmol/L   CO2 23 22 - 32 mmol/L   Glucose, Bld 92 70 - 99 mg/dL   BUN 11 8 - 23 mg/dL   Creatinine, Ser 4.78 0.44 - 1.00 mg/dL   Calcium 7.9 (L) 8.9 - 10.3 mg/dL   GFR, Estimated >29 >56 mL/min   Anion gap 6 5 - 15  CBC     Status: Abnormal   Collection Time: 09/14/20  3:51 PM  Result Value Ref Range   WBC 12.4 (H) 4.0 - 10.5 K/uL   RBC 3.67 (L) 3.87 - 5.11 MIL/uL   Hemoglobin 12.1 12.0 - 15.0 g/dL   HCT 21.3 (L) 36 - 46 %   MCV 92.4 80.0 - 100.0 fL   MCH 33.0 26.0 - 34.0 pg   MCHC 35.7 30.0 - 36.0 g/dL   RDW 08.6 57.8 - 46.9 %   Platelets 378 150 - 400 K/uL   nRBC 0.0 0.0 - 0.2 %  Troponin I (High Sensitivity)     Status: None   Collection Time: 09/14/20  3:51 PM  Result Value Ref Range   Troponin I (High Sensitivity) 7 <18 ng/L  Urinalysis, Complete w Microscopic Urine, Clean Catch     Status: Abnormal   Collection Time: 09/14/20  4:29 PM  Result Value Ref Range   Color, Urine YELLOW (A) YELLOW   APPearance HAZY (A) CLEAR   Specific Gravity, Urine 1.009 1.005 - 1.030   pH 6.0 5.0 - 8.0   Glucose, UA NEGATIVE NEGATIVE mg/dL   Hgb urine dipstick NEGATIVE NEGATIVE   Bilirubin Urine NEGATIVE NEGATIVE   Ketones, ur NEGATIVE NEGATIVE mg/dL   Protein, ur NEGATIVE NEGATIVE mg/dL   Nitrite NEGATIVE NEGATIVE   Leukocytes,Ua LARGE (A) NEGATIVE   RBC /  HPF 0-5 0 - 5 RBC/hpf   WBC, UA 6-10 0 - 5 WBC/hpf   Bacteria, UA RARE (A) NONE SEEN   Squamous Epithelial / LPF 6-10 0 - 5   Mucus PRESENT    Hyaline Casts, UA PRESENT   Troponin I (High Sensitivity)     Status: None   Collection Time: 09/14/20  6:52 PM  Result Value Ref Range   Troponin I (High Sensitivity) 6 <18 ng/L  Magnesium     Status: None   Collection Time: 09/14/20  6:52 PM  Result Value Ref Range   Magnesium 1.7 1.7 - 2.4 mg/dL  Lactic acid, plasma     Status: None   Collection Time:  09/14/20  9:39 PM  Result Value Ref Range   Lactic Acid, Venous 1.3 0.5 - 1.9 mmol/L  Resp Panel by RT-PCR (Flu A&B, Covid) Nasopharyngeal Swab     Status: None   Collection Time: 09/14/20 10:47 PM   Specimen: Nasopharyngeal Swab; Nasopharyngeal(NP) swabs in vial transport medium  Result Value Ref Range   SARS Coronavirus 2 by RT PCR NEGATIVE NEGATIVE   Influenza A by PCR NEGATIVE NEGATIVE   Influenza B by PCR NEGATIVE NEGATIVE  Blood culture (routine x 2)     Status: None (Preliminary result)   Collection Time: 09/14/20 10:47 PM   Specimen: BLOOD  Result Value Ref Range   Specimen Description BLOOD BLOOD RIGHT HAND    Special Requests      BOTTLES DRAWN AEROBIC AND ANAEROBIC Blood Culture results may not be optimal due to an excessive volume of blood received in culture bottles   Culture      NO GROWTH < 12 HOURS Performed at [facility], [address] [place], [place] [address]    Report Status PENDING   Blood culture (routine x 2)     Status: None (Preliminary result)   Collection Time: 09/14/20 10:48 PM   Specimen: BLOOD  Result Value Ref Range   Specimen Description BLOOD RIGHT ANTECUBITAL    Special Requests      BOTTLES DRAWN AEROBIC AND ANAEROBIC Blood Culture adequate volume   Culture      NO GROWTH < 12 HOURS Performed at [facility], [address] [place], [place] [address]    Report Status PENDING   MRSA PCR Screening     Status: None   Collection Time: 09/15/20  3:30 AM   Specimen: Nasopharyngeal  Result Value Ref Range   MRSA by PCR NEGATIVE NEGATIVE  Glucose, capillary     Status: None   Collection Time: 09/15/20  3:32 AM  Result Value Ref Range   Glucose-Capillary 91 70 - 99 mg/dL  Lactic acid, plasma     Status: None   Collection Time: 09/15/20  7:02 AM  Result Value Ref Range   Lactic Acid, Venous 1.1 0.5 - 1.9 mmol/L  Protime-INR     Status: Abnormal   Collection Time: 09/15/20  7:02 AM  Result Value Ref Range    Prothrombin Time 15.9 (H) 11.4 - 15.2 seconds   INR 1.3 (H) 0.8 - 1.2  Procalcitonin     Status: None   Collection Time: 09/15/20  7:02 AM  Result Value Ref Range   Procalcitonin 0.10 ng/mL  Basic metabolic panel     Status: Abnormal   Collection Time: 09/15/20  7:02 AM  Result Value Ref Range   Sodium 134 (L) 135 - 145 mmol/L   Potassium 3.4 (L) 3.5 - 5.1  mmol/L   Chloride 108 98 - 111 mmol/L   CO2 16 (L) 22 - 32 mmol/L   Glucose, Bld 87 70 - 99 mg/dL   BUN 11 8 - 23 mg/dL   Creatinine, Ser 4.94 0.44 - 1.00 mg/dL   Calcium 7.6 (L) 8.9 - 10.3 mg/dL   GFR, Estimated >49 >67 mL/min   Anion gap 10 5 - 15  CBC     Status: Abnormal   Collection Time: 09/15/20  7:02 AM  Result Value Ref Range   WBC 12.5 (H) 4.0 - 10.5 K/uL   RBC 3.05 (L) 3.87 - 5.11 MIL/uL   Hemoglobin 9.9 (L) 12.0 - 15.0 g/dL   HCT 59.1 (L) 36 - 46 %   MCV 90.2 80.0 - 100.0 fL   MCH 32.5 26.0 - 34.0 pg   MCHC 36.0 30.0 - 36.0 g/dL   RDW 63.8 46.6 - 59.9 %   Platelets 321 150 - 400 K/uL   nRBC 0.0 0.0 - 0.2 %  Magnesium     Status: Abnormal   Collection Time: 09/15/20  7:02 AM  Result Value Ref Range   Magnesium 1.4 (L) 1.7 - 2.4 mg/dL  Phosphorus     Status: Abnormal   Collection Time: 09/15/20  7:02 AM  Result Value Ref Range   Phosphorus 2.2 (L) 2.5 - 4.6 mg/dL   DG Chest 2 View  Result Date: 09/14/2020 CLINICAL DATA:  Dizzy. EXAM: CHEST - 2 VIEW COMPARISON:  PET CT November 9 21. FINDINGS: When comparing across modalities to PET-CT from September 01, 2020, apparent continued interval increase in size of a masslike area of consolidation with central cavitation in the right upper lobe with air-fluid level. Surrounding opacities. Similar cardiomediastinal silhouette. No visible pleural effusions or pneumothorax. No acute osseous abnormality. IMPRESSION: When comparing across modalities to PET-CT from September 01, 2020, apparent continued interval increase in size of a masslike area of consolidation with central  cavitation in the right upper lobe with air-fluid level. As noted on prior PET-CT, findings raise concern for cavitary pneumonia (possibly with developing pulmonary abscess). Progressive necrotic bronchogenic carcinoma is not excluded. Dedicated chest CT with contrast could better evaluate and allow for more direct comparison, if clinically indicated. Findings discussed with Dr. Marisa Severin At 4:42 via telephone. Electronically Signed   By: Feliberto Harts MD   On: 09/14/2020 16:48     ASSESSMENT AND PLAN: Atrial fibrillation with rapid ventricular response rate right now ventricular rate is stable around 90.  Advised weaning off Cardizem and continue IV amiodarone.  Apparently patient needs bronchoscopy and heparin can be discontinued prior to that procedure.  After the procedure patient can be switched to Eliquis or Xarelto.  We will continue amiodarone as patient probably will need to be converted to sinus rhythm.  Armand Preast A

## 2020-09-15 NOTE — ED Notes (Signed)
ICU NP at bedside at this time  °

## 2020-09-15 NOTE — Telephone Encounter (Signed)
Called patient to reschedule appt that was no showed on 11.18.21. Patients husband answered and states pt is in [place] and will be there another 3 days. Pt husband requested a call back on Monday to discuss appt. FYI

## 2020-09-16 ENCOUNTER — Inpatient Hospital Stay: Payer: Medicare Other

## 2020-09-16 DIAGNOSIS — I4891 Unspecified atrial fibrillation: Secondary | ICD-10-CM | POA: Diagnosis not present

## 2020-09-16 DIAGNOSIS — K529 Noninfective gastroenteritis and colitis, unspecified: Secondary | ICD-10-CM

## 2020-09-16 DIAGNOSIS — J984 Other disorders of lung: Secondary | ICD-10-CM | POA: Diagnosis not present

## 2020-09-16 DIAGNOSIS — I480 Paroxysmal atrial fibrillation: Secondary | ICD-10-CM

## 2020-09-16 DIAGNOSIS — J851 Abscess of lung with pneumonia: Principal | ICD-10-CM

## 2020-09-16 DIAGNOSIS — R1319 Other dysphagia: Secondary | ICD-10-CM

## 2020-09-16 LAB — CORTISOL: Cortisol, Plasma: 19.3 ug/dL

## 2020-09-16 LAB — COMPREHENSIVE METABOLIC PANEL
ALT: 13 U/L (ref 0–44)
AST: 18 U/L (ref 15–41)
Albumin: 2.1 g/dL — ABNORMAL LOW (ref 3.5–5.0)
Alkaline Phosphatase: 54 U/L (ref 38–126)
Anion gap: 6 (ref 5–15)
BUN: 11 mg/dL (ref 8–23)
CO2: 19 mmol/L — ABNORMAL LOW (ref 22–32)
Calcium: 8 mg/dL — ABNORMAL LOW (ref 8.9–10.3)
Chloride: 112 mmol/L — ABNORMAL HIGH (ref 98–111)
Creatinine, Ser: 0.77 mg/dL (ref 0.44–1.00)
GFR, Estimated: >60 mL/min (ref >60)
Glucose, Bld: 121 mg/dL — ABNORMAL HIGH (ref 70–99)
Potassium: 3.8 mmol/L (ref 3.5–5.1)
Sodium: 137 mmol/L (ref 135–145)
Total Bilirubin: 0.6 mg/dL (ref 0.3–1.2)
Total Protein: 4.9 g/dL — ABNORMAL LOW (ref 6.5–8.1)

## 2020-09-16 LAB — PHOSPHORUS: Phosphorus: 1.8 mg/dL — ABNORMAL LOW (ref 2.5–4.6)

## 2020-09-16 LAB — MAGNESIUM: Magnesium: 2.1 mg/dL (ref 1.7–2.4)

## 2020-09-16 MED ORDER — POTASSIUM & SODIUM PHOSPHATES 280-160-250 MG PO PACK
2.0000 | PACK | Freq: Three times a day (TID) | ORAL | Status: DC
  Filled 2020-09-16 (×3): qty 2

## 2020-09-16 MED ORDER — AMOXICILLIN-POT CLAVULANATE 875-125 MG PO TABS
1.0000 | ORAL_TABLET | Freq: Two times a day (BID) | ORAL | Status: DC
  Administered 2020-09-17: 1 via ORAL
  Filled 2020-09-16: qty 1

## 2020-09-16 MED ORDER — AMIODARONE HCL 200 MG PO TABS: 200.0000 mg | ORAL_TABLET | Freq: Two times a day (BID) | ORAL | Status: DC

## 2020-09-16 MED ORDER — DILTIAZEM HCL 30 MG PO TABS
30.0000 mg | ORAL_TABLET | Freq: Two times a day (BID) | ORAL | Status: DC
  Administered 2020-09-16 – 2020-09-17 (×3): 30 mg via ORAL
  Filled 2020-09-16 (×3): qty 1

## 2020-09-16 MED ORDER — IPRATROPIUM-ALBUTEROL 0.5-2.5 (3) MG/3ML IN SOLN
3.0000 mL | Freq: Three times a day (TID) | RESPIRATORY_TRACT | Status: DC
  Administered 2020-09-16 – 2020-09-17 (×3): 3 mL via RESPIRATORY_TRACT
  Filled 2020-09-16 (×3): qty 3

## 2020-09-16 MED ORDER — LATANOPROST 0.005 % OP SOLN
1.0000 [drp] | Freq: Every day | OPHTHALMIC | Status: DC
  Administered 2020-09-16: 22:00:00 1 [drp] via OPHTHALMIC
  Filled 2020-09-16: qty 2.5

## 2020-09-16 MED ORDER — ROPINIROLE HCL 1 MG PO TABS
2.0000 mg | ORAL_TABLET | Freq: Two times a day (BID) | ORAL | Status: DC
  Administered 2020-09-16: 22:00:00 2 mg via ORAL
  Filled 2020-09-16 (×2): qty 2

## 2020-09-16 MED ORDER — FLUCONAZOLE 100 MG PO TABS
100.0000 mg | ORAL_TABLET | Freq: Every day | ORAL | Status: DC
  Administered 2020-09-17: 09:00:00 100 mg via ORAL
  Filled 2020-09-16: qty 1

## 2020-09-16 NOTE — Progress Notes (Signed)
SUBJECTIVE: Patient resting comfortably in bed. No active complaints. Denies chest pain, shortness of breath, or irregular heart beat.   Vitals:   09/16/20 0025 09/16/20 0400 09/16/20 0745 09/16/20 0823  BP: 110/65 114/72  111/73  Pulse: 68 74  75  Resp: 15 17  17   Temp: 98.3 F (36.8 C) 97.8 F (36.6 C)  98.4 F (36.9 C)  TempSrc: Oral Oral  Oral  SpO2: 100% 100% 98% 98%  Weight:      Height:        Intake/Output Summary (Last 24 hours) at 09/16/2020 1136 Last data filed at 09/16/2020 0300 Gross per 24 hour  Intake 100 ml  Output --  Net 100 ml    LABS: Basic Metabolic Panel: Recent Labs    09/15/20 0702 09/16/20 0540  NA 134* 137  K 3.4* 3.8  CL 108 112*  CO2 16* 19*  GLUCOSE 87 121*  BUN 11 11  CREATININE 0.86 0.77  CALCIUM 7.6* 8.0*  MG 1.4* 2.1  PHOS 2.2* 1.8*   Liver Function Tests: Recent Labs    09/16/20 0540  AST 18  ALT 13  ALKPHOS 54  BILITOT 0.6  PROT 4.9*  ALBUMIN 2.1*   No results for input(s): LIPASE, AMYLASE in the last 72 hours. CBC: Recent Labs    09/14/20 1551 09/15/20 0702  WBC 12.4* 12.5*  HGB 12.1 9.9*  HCT 33.9* 27.5*  MCV 92.4 90.2  PLT 378 321   Cardiac Enzymes: No results for input(s): CKTOTAL, CKMB, CKMBINDEX, TROPONINI in the last 72 hours. BNP: Invalid input(s): POCBNP D-Dimer: No results for input(s): DDIMER in the last 72 hours. Hemoglobin A1C: No results for input(s): HGBA1C in the last 72 hours. Fasting Lipid Panel: No results for input(s): CHOL, HDL, LDLCALC, TRIG, CHOLHDL, LDLDIRECT in the last 72 hours. Thyroid Function Tests: No results for input(s): TSH, T4TOTAL, T3FREE, THYROIDAB in the last 72 hours.  Invalid input(s): FREET3 Anemia Panel: Recent Labs    09/15/20 0701 09/15/20 0703  VITAMINB12 4,364*  --   TIBC  --  130*  IRON  --  13*     PHYSICAL EXAM General: Well developed, well nourished, in no acute distress HEENT:  Normocephalic and atramatic Neck:  No JVD.  Lungs: Clear  bilaterally to auscultation and percussion. Heart: HRRR . Normal S1 and S2 without gallops or murmurs.  Abdomen: Bowel sounds are positive, abdomen soft and non-tender  Msk:  Back normal, normal gait. Normal strength and tone for age. Extremities: No clubbing, cyanosis or edema.   Neuro: Alert and oriented X 3. Psych:  Good affect, responds appropriately  TELEMETRY: NSR 76/bpm  ASSESSMENT AND PLAN: Patient presenting d/t syncopal episode and found to be in A fib with RVR. Patient has since converted to NSR and Amiodarone infusion stopped yesterday afternoon. Will continue the patient on PO Amiodarone 200mg  bid. Recommend patient be on Eliquis or Xarelto but will wait to start based on when patient will have bronchoscopy d/t RUL cavitary mass.   Echocardiogram with LVEF 60-65% with Grade I diastolic dysfunction and Mild MR/TR.  Will continue to follow.  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     

## 2020-09-16 NOTE — Progress Notes (Signed)
CSW acknowledges consult to follow up for SA resources and HH/DME needs. Will need to follow up after PT/OT evals.   Alfonso Ramus, [place] [phone]

## 2020-09-16 NOTE — Progress Notes (Signed)
[facility]  - [facility]   PATIENT NAME: Adella Manolis    MR#:  [id number]  DATE OF BIRTH:  09-10-1946  SUBJECTIVE:    REVIEW OF SYSTEMS:   ROS Tolerating Diet: Tolerating PT:   DRUG ALLERGIES:   Allergies  Allergen Reactions  . Codeine Itching  . Cyclobenzaprine Itching  . Penicillins     Itching without rash per patient.  Unable to say when it happened, other than "a long time ago"    VITALS:  Blood pressure 115/76, pulse 71, temperature 98.7 F (37.1 C), temperature source Oral, resp. rate 17, height 4\' 10"  (1.473 m), weight 39.5 kg, SpO2 100 %.  PHYSICAL EXAMINATION:   Physical Exam  GENERAL:  74 y.o.-year-old patient lying in the bed with no acute distress.  HEENT: Head atraumatic, normocephalic. Oropharynx and nasopharynx clear.  NECK:  Supple, no jugular venous distention. No thyroid enlargement, no tenderness.  LUNGS: Normal breath sounds bilaterally, no wheezing, rales, rhonchi. No use of accessory muscles of respiration.  CARDIOVASCULAR: S1, S2 normal. No murmurs, rubs, or gallops.  ABDOMEN: Soft, nontender, nondistended. Bowel sounds present. No organomegaly or mass.  EXTREMITIES: No cyanosis, clubbing or edema b/l.    NEUROLOGIC: Cranial nerves II through XII are intact. No focal Motor or sensory deficits b/l.   PSYCHIATRIC:  patient is alert and oriented x 3.  SKIN: No obvious rash, lesion, or ulcer.   LABORATORY PANEL:  CBC Recent Labs  Lab 09/15/20 0702  WBC 12.5*  HGB 9.9*  HCT 27.5*  PLT 321    Chemistries  Recent Labs  Lab 09/16/20 0540  NA 137  K 3.8  CL 112*  CO2 19*  GLUCOSE 121*  BUN 11  CREATININE 0.77  CALCIUM 8.0*  MG 2.1  AST 18  ALT 13  ALKPHOS 54  BILITOT 0.6   Cardiac Enzymes No results for input(s): TROPONINI in the last 168 hours. RADIOLOGY:  DG Chest 2 View  Result Date: 09/14/2020 CLINICAL DATA:  Dizzy. EXAM: CHEST - 2 VIEW COMPARISON:  PET CT November 9 21. FINDINGS: When  comparing across modalities to PET-CT from September 01, 2020, apparent continued interval increase in size of a masslike area of consolidation with central cavitation in the right upper lobe with air-fluid level. Surrounding opacities. Similar cardiomediastinal silhouette. No visible pleural effusions or pneumothorax. No acute osseous abnormality. IMPRESSION: When comparing across modalities to PET-CT from September 01, 2020, apparent continued interval increase in size of a masslike area of consolidation with central cavitation in the right upper lobe with air-fluid level. As noted on prior PET-CT, findings raise concern for cavitary pneumonia (possibly with developing pulmonary abscess). Progressive necrotic bronchogenic carcinoma is not excluded. Dedicated chest CT with contrast could better evaluate and allow for more direct comparison, if clinically indicated. Findings discussed with Dr. September 03, 2020 At 4:42 via telephone. Electronically Signed   By: Marisa Severin MD   On: 09/14/2020 16:48   ECHOCARDIOGRAM COMPLETE  Result Date: 09/15/2020    ECHOCARDIOGRAM REPORT   Patient Name:   LYNSAY FESPERMAN Date of Exam: 09/15/2020 Medical Rec #:  09/17/2020        Height:       58.0 in Accession #:    [id number]       Weight:       87.0 lb Date of Birth:  [birth year]        BSA:          1.278 m Patient Age:  74 years         BP:           108/66 mmHg Patient Gender: F                HR:           86 bpm. Exam Location:  [facility] Procedure: 2D Echo, Color Doppler and Cardiac Doppler Indications:     I48.91 Atrial Fibrillation  History:         Patient has no prior history of Echocardiogram examinations.                  COPD; Risk Factors:Hypertension and Dyslipidemia.  Sonographer:     Humphrey Rolls RDCS (AE) Referring Phys:  [id number] Vernetta Honey MANSY Diagnosing Phys: Adrian Blackwater MD IMPRESSIONS  1. Left ventricular ejection fraction, by estimation, is 60 to 65%. The left ventricle has normal function. The left ventricle has no  regional wall motion abnormalities. Left ventricular diastolic parameters are consistent with Grade I diastolic dysfunction (impaired relaxation).  2. Right ventricular systolic function is normal. The right ventricular size is normal.  3. Left atrial size was moderately dilated.  4. Right atrial size was moderately dilated.  5. The mitral valve is normal in structure. Mild mitral valve regurgitation. No evidence of mitral stenosis. Moderate mitral annular calcification.  6. The aortic valve is normal in structure. Aortic valve regurgitation is not visualized. No aortic stenosis is present.  7. The inferior vena cava is normal in size with greater than 50% respiratory variability, suggesting right atrial pressure of 3 mmHg. FINDINGS  Left Ventricle: Left ventricular ejection fraction, by estimation, is 60 to 65%. The left ventricle has normal function. The left ventricle has no regional wall motion abnormalities. The left ventricular internal cavity size was normal in size. There is  no left ventricular hypertrophy. Left ventricular diastolic parameters are consistent with Grade I diastolic dysfunction (impaired relaxation). Right Ventricle: The right ventricular size is normal. No increase in right ventricular wall thickness. Right ventricular systolic function is normal. Left Atrium: Left atrial size was moderately dilated. Right Atrium: Right atrial size was moderately dilated. Pericardium: There is no evidence of pericardial effusion. Mitral Valve: The mitral valve is normal in structure. Moderate mitral annular calcification. Mild mitral valve regurgitation. No evidence of mitral valve stenosis. MV peak gradient, 7.4 mmHg. The mean mitral valve gradient is 4.0 mmHg. Tricuspid Valve: The tricuspid valve is normal in structure. Tricuspid valve regurgitation is mild . No evidence of tricuspid stenosis. Aortic Valve: The aortic valve is normal in structure. Aortic valve regurgitation is not visualized. No aortic  stenosis is present. Aortic valve mean gradient measures 5.0 mmHg. Aortic valve peak gradient measures 9.7 mmHg. Pulmonic Valve: The pulmonic valve was normal in structure. Pulmonic valve regurgitation is not visualized. No evidence of pulmonic stenosis. Aorta: The aortic root is normal in size and structure. Venous: The inferior vena cava is normal in size with greater than 50% respiratory variability, suggesting right atrial pressure of 3 mmHg. IAS/Shunts: No atrial level shunt detected by color flow Doppler.  LEFT VENTRICLE PLAX 2D LVIDd:         3.85 cm Diastology LVIDs:         2.08 cm LV e' medial:    7.18 cm/s LV PW:         1.07 cm LV E/e' medial:  18.2 LV IVS:        0.78 cm LV e' lateral:   7.62  cm/s                        LV E/e' lateral: 17.2  RIGHT VENTRICLE RV Basal diam:  3.04 cm LEFT ATRIUM           Index       RIGHT ATRIUM           Index LA diam:      3.70 cm 2.89 cm/m  RA Area:     10.20 cm LA Vol (A4C): 48.3 ml 37.78 ml/m RA Volume:   20.40 ml  15.96 ml/m  AORTIC VALVE                    PULMONIC VALVE AV Vmax:           156.00 cm/s  PV Vmax:       1.18 m/s AV Vmean:          108.000 cm/s PV Vmean:      75.600 cm/s AV VTI:            0.307 m      PV VTI:        0.209 m AV Peak Grad:      9.7 mmHg     PV Peak grad:  5.6 mmHg AV Mean Grad:      5.0 mmHg     PV Mean grad:  3.0 mmHg LVOT Vmax:         106.00 cm/s LVOT Vmean:        68.100 cm/s LVOT VTI:          0.216 m LVOT/AV VTI ratio: 0.70 MITRAL VALVE                TRICUSPID VALVE MV Area (PHT): 3.27 cm     TR Peak grad:   25.0 mmHg MV Peak grad:  7.4 mmHg     TR Vmax:        250.00 cm/s MV Mean grad:  4.0 mmHg MV Vmax:       1.36 m/s     SHUNTS MV Vmean:      93.3 cm/s    Systemic VTI: 0.22 m MV Decel Time: 232 msec MV E velocity: 131.00 cm/s MV A velocity: 93.00 cm/s MV E/A ratio:  1.41 Adrian Blackwater MD Electronically signed by Adrian Blackwater MD Signature Date/Time: 09/15/2020/12:14:22 PM    Final    DG ESOPHAGUS W SINGLE CM (SOL OR THIN  BA)  Result Date: 09/16/2020 CLINICAL DATA:  Dysphagia, history of right upper lobe cavitary mass EXAM: ESOPHOGRAM/BARIUM SWALLOW TECHNIQUE: Single contrast examination was performed using  thin barium. FLUOROSCOPY TIME:  Fluoroscopy Time:  1 minutes 6 seconds Radiation Exposure Index (if provided by the fluoroscopic device): 17.5 mGy Number of Acquired Spot Images: Multiple cine fluoroscopic runs. COMPARISON:  None. FINDINGS: Swallowing mechanism and esophageal transit time are within normal limits. No mucosal abnormality is noted. No obstructive changes are seen. Small sliding-type hiatal hernia is noted which is variable. No significant reflux is noted. Note is made during fluoroscopy of the patient's known cavitary right upper lobe mass. IMPRESSION: No acute esophageal abnormality noted. Transient sliding-type hiatal hernia. Cavitary right upper lobe mass. Electronically Signed   By: Alcide Clever M.D.   On: 09/16/2020 09:14   ASSESSMENT AND PLAN:   JanetCallahanis a74 y.o.Caucasian femalewith a known history of COPD, glaucoma, hypertension, dyslipidemia and cavitary right upper lobe mass, who presented to the emergency room  with acute onset of syncope while getting off of a barstool with subsequent fall to the left side without injuries. Patient also has been complaining of dysphagia for few month, recently swallowing has been getting better.  1. Right upper lobe cavitary mass -  Differential including abscess, bronchogenic carcinoma. -Patient covered with meropenem, vancomycin and Zithromax--IV unasyn -I spoke with Dr. Jayme CloudGonzalez who follows pt has out pt  2. Acute atrial fibrillation with rapid ventricular response--on admission Patient received IV diltiazem.--change to po cardizem 30 mg bid (b soft) -HR stable in the 70's and in SR on tele -I have d/ced Amiodarone since pt needs po diflucan  For thrush -patient's heart rate is stable. -Patient seen by Dr. Welton FlakesKhan. She will follow-up  outpatient with cardiology. They do recommend anticoagulation however patient need bronchoscopy and hence holding for now. Patient also is in sinus rhythm.  #3.  Failure to thrive/Dyshpagia likley due to esophagitis candida  oral thrush Severe protein calorie malnutrition. Chronic diarrhea. -Weight loss. -Change to oral Diflucan from tomorrow-- total 10 days - DG swallow Esophagogram-- no structure. Appears normal. -Will follow-up with G.I. Dr. Tobi BastosAnna as outpatient. -C diff negative  #4.  Chronic anemia. -Most likely due to no absorption.   -continue MVI  #5.  Hypokalemia, hypomagnesemia, mild hypophosphatemia. -Supplement by pharmacy  #6. Syncope with hypotension Likely due to volume depletion. Received fluids. Check cortisol level.   # 8.COPD. Stable. -Sats hundred percent on room air   PT to see patient. TOC for discharge planning.  DVT prophylaxis: Lovenox Code Status: Full Family Communication:  son Fayrene FearingJames updated on the phone  disposition Plan:  to home likely tomorrow   Status is: Inpatient  Remains inpatient appropriate because:Inpatient level of care appropriate due to severity of illness   Dispo: The patient is from: Home  Anticipated d/c is to: Home  Anticipated d/c date is:11/25  Patient currently is not medically stable to d/c.        TOTAL TIME TAKING CARE OF THIS PATIENT: 25 minutes.  >50% time spent on counselling and coordination of care  Note: This dictation was prepared with Dragon dictation along with smaller phrase technology. Any transcriptional errors that result from this process are unintentional.  Enedina FinnerSona Kymberlee Viger M.D    [facility]   CC: Primary care physician; Margaretann LovelessBurnette, Jennifer M, PA-CPatient ID: Cathie HoopsJanet M Searcy, female   DOB: [birth year], 74 y.o.   MRN: [id number]

## 2020-09-16 NOTE — Evaluation (Signed)
Physical Therapy Evaluation Patient Details Name: Suzanne Avila MRN: [id number] DOB: August 02, 1946 Today's Date: 09/16/2020   History of Present Illness  74 yo female that presents d/t syncopal episode without injury. She has a PMHx significant for COPD, HLD, and HTN. During admission pt found to have atrial fibrillation with RVR, now on amiodarone and cardizem for control.  Clinical Impression  The pt presents with readiness to d/c home. She demonstrates antalagic gait pattern d/t fall on the R side. Prior to admission pt ambulated without the use of an AD unless she was completing community distances. This session the pt requires use of RW for safe mobility and for optimization of independence. BP taken in order to determine if at this time if is a concern for mobility but all vitals were WNL. She would highly benefit from continued mobility with RN staff and PT while in house in order to optimize functional mobility. Recommend home with family care and use RW (pt has) for safety.     Follow Up Recommendations Supervision for mobility/OOB;No PT follow up    Equipment Recommendations  None recommended by PT    Recommendations for Other Services       Precautions / Restrictions Precautions Precautions: Fall Restrictions Weight Bearing Restrictions: No      Mobility  Bed Mobility Overal bed mobility: Modified Independent             General bed mobility comments: HOB elevated    Transfers Overall transfer level: Needs assistance Equipment used: 1 person hand held assist Transfers: Sit to/from Stand Sit to Stand: Mod assist         General transfer comment: Pt initially requiring Mod A to stand from bed with HHA;  Ambulation/Gait Ambulation/Gait assistance: Modified independent (Device/Increase time) Gait Distance (Feet): 60 Feet Assistive device: Rolling walker (2 wheeled)       General Gait Details: Pt initially ambulating with HHA x 25' requiring Mod A. Pt  then given RW and is Mod I for mobility.  Stairs            Wheelchair Mobility    Modified Rankin (Stroke Patients Only)       Balance Overall balance assessment: Mild deficits observed, not formally tested                                           Pertinent Vitals/Pain Pain Assessment: No/denies pain    Home Living Family/patient expects to be discharged to:: Private residence Living Arrangements: Spouse/significant other;Children;Non-relatives/Friends Available Help at Discharge: Family Type of Home: House Home Access: Level entry;Ramped entrance     Home Layout: One level Home Equipment: Bedside commode;Shower seat      Prior Function Level of Independence: Independent         Comments: Only uses cane for long distance ambulation.     Hand Dominance   Dominant Hand: Left    Extremity/Trunk Assessment   Upper Extremity Assessment Upper Extremity Assessment: Overall WFL for tasks assessed    Lower Extremity Assessment Lower Extremity Assessment: Overall WFL for tasks assessed    Cervical / Trunk Assessment Cervical / Trunk Assessment: Kyphotic  Communication   Communication: No difficulties  Cognition Arousal/Alertness: Awake/alert Behavior During Therapy: WFL for tasks assessed/performed Overall Cognitive Status: Within Functional Limits for tasks assessed  General Comments      Exercises     Assessment/Plan    PT Assessment Patient needs continued PT services  PT Problem List Decreased mobility;Decreased strength       PT Treatment Interventions Gait training;Balance training;Patient/family education;Functional mobility training    PT Goals (Current goals can be found in the Care Plan section)  Acute Rehab PT Goals Patient Stated Goal: to go home PT Goal Formulation: With patient Time For Goal Achievement: 09/30/20 Potential to Achieve Goals: Good     Frequency 7X/week   Barriers to discharge        Co-evaluation               AM-PAC PT "6 Clicks" Mobility  Outcome Measure Help needed turning from your back to your side while in a flat bed without using bedrails?: None Help needed moving from lying on your back to sitting on the side of a flat bed without using bedrails?: None Help needed moving to and from a bed to a chair (including a wheelchair)?: None Help needed standing up from a chair using your arms (e.g., wheelchair or bedside chair)?: A Little Help needed to walk in hospital room?: A Little Help needed climbing 3-5 steps with a railing? : A Little 6 Click Score: 21    End of Session   Activity Tolerance: Patient tolerated treatment well Patient left: in bed;with nursing/sitter in room (sitting at EOB with RN in room.) Nurse Communication: Mobility status PT Visit Diagnosis: Unsteadiness on feet (R26.81);Pain Pain - Right/Left: Right Pain - part of body: Shoulder;Hip (Pt reporting some pain with ambulating that improves with the use of an AD.)    Time: 3244-0102 PT Time Calculation (min) (ACUTE ONLY): 42 min   Charges:   PT Evaluation $PT Eval Low Complexity: 1 Low PT Treatments $Gait Training: 23-37 mins       2:17 PM, 09/16/20 [place] Subia A. Mordecai Maes PT, DPT Physical Therapist - [facility]   Jamila Slatten A Reiner Loewen 09/16/2020, 2:14 PM

## 2020-09-16 NOTE — Progress Notes (Signed)
Pt off unit for testing in radiology in stable condition

## 2020-09-16 NOTE — Care Management Important Message (Signed)
Important Message  Patient Details  Name: Suzanne Avila MRN: [id number] Date of Birth: [birth year]   Medicare Important Message Given:  N/A - LOS <3 / Initial given by admissions     Olegario Messier A Jaclynn Laumann 09/16/2020, 8:09 AM

## 2020-09-16 NOTE — Consult Note (Signed)
PHARMACY CONSULT NOTE - FOLLOW UP  Pharmacy Consult for Electrolyte Monitoring and Replacement   Recent Labs: Potassium (mmol/L)  Date Value  09/16/2020 3.8  03/22/2014 2.9 (L)   Magnesium (mg/dL)  Date Value  44/92/0100 2.1   Calcium (mg/dL)  Date Value  71/21/9758 8.0 (L)   Calcium, Total (mg/dL)  Date Value  83/25/4982 9.2   Albumin (g/dL)  Date Value  64/15/8309 2.1 (L)  02/03/2020 4.2  03/22/2014 3.9   Phosphorus (mg/dL)  Date Value  40/76/8088 1.8 (L)   Sodium (mmol/L)  Date Value  09/16/2020 137  02/03/2020 137  03/22/2014 135 (L)     Assessment: 74 y.o.femalewith a known history of COPD, glaucoma, hypertension, dyslipidemia and cavitary right upper lobe mass, with acute onset of syncope found to have atrial fibrillation with RVR with mild elevation of troponin.  Heart rate is improved.    Pharmacy has been consulted to monitor and replenish electrolytes.  Goal of Therapy:  Electrolytes wnl's  Plan:  K 3.8, Mg 2.1, and Phos 1.8 (after oral replenishment 11/23)  Will give 2 phos-nak packets x 3 and follow up electrolytes with am labs  Albina Billet ,PharmD Clinical Pharmacist 09/16/2020 8:25 AM

## 2020-09-16 NOTE — Progress Notes (Addendum)
Subjective:    Patient ID: Suzanne Avila, female    DOB: Dec 05, 1945, 74 y.o.   MRN: [id number]  HPI States she feels better.  Swallowing has improved dramatically since antifungal started.  Query Candida esophagitis.  States she had "bad taste" in her mouth  prior to coming to the hospital now this has resolved.  Diarrhea better controlled.  Cough better controlled.   Review of Systems A 10 point review of systems was performed and it is as noted above otherwise negative.  Patient Active Problem List   Diagnosis Date Noted  . Cavitating mass in right upper lung lobe 09/15/2020  . AF (paroxysmal atrial fibrillation) (HCC) 09/15/2020  . Failure to thrive in adult 09/15/2020  . Severe protein-calorie malnutrition [name]: less than 60% of standard weight) (HCC) 09/15/2020  . Dysphagia 09/15/2020  . Hypotension 09/15/2020  . Abscess of right lung with pneumonia (HCC) 09/14/2020  . Chronic low back pain (Left) w/o sciatica 09/10/2020  . Pharmacologic therapy 05/05/2020  . Chronic diarrhea 05/04/2020  . DDD (degenerative disc disease), lumbosacral 11/19/2019  . Therapeutic opioid-induced constipation (OIC) 08/01/2019  . Chronic sacroiliac joint pain (Left) 05/01/2019  . Chronic groin pain (Left) 05/01/2019  . Spondylosis without myelopathy or radiculopathy, lumbosacral region 05/01/2019  . Other specified dorsopathies, sacral and sacrococcygeal region 05/01/2019  . Renal artery stenosis (HCC) 12/27/2017  . Pain in limb 12/22/2017  . Tobacco use disorder 12/22/2017  . Renal atrophy, right 12/22/2017  . Thoracic radiculitis 01/31/2017  . Chronic lower extremity pain (Primary Area of Pain) (Bilateral) (L>R) 04/25/2016  . Chronic upper extremity pain (Third area of Pain) (Bilateral) (L>R) 04/25/2016  . Disturbance of skin sensation 04/20/2016  . Neurogenic pain 04/20/2016  . Chronic hip pain (Bilateral) (L>R) 11/11/2015  . Vitamin D deficiency 11/11/2015  . Edema 09/02/2015  .  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 Encounter for therapeutic drug level monitoring 08/14/2015  . Long term current use of opiate analgesic 08/14/2015  . Long term prescription opiate use 08/14/2015  . Uncomplicated opioid dependence (HCC) 08/14/2015  . Opiate use (30 MME/Day) 08/14/2015  . Chronic low back pain (2ry area of Pain) (Bilateral) (L>R) w/ sciatica (Bilateral) 08/14/2015  . Chronic pain syndrome 08/14/2015  . Alcoholic (HCC) 08/14/2015  . B12 deficiency 08/14/2015  . Narrowing of intervertebral disc space 08/14/2015  . Encounter for screening for diabetes mellitus 08/14/2015  . History of metabolic disorder 08/14/2015  . Fungal infection of toenail 08/14/2015  . Osteopenia 08/14/2015  . Compulsive tobacco user syndrome 08/14/2015  . Osteoarthritis of hip (Left) 08/14/2015  . Chronic hip pain (Left) 08/14/2015  . Chronic lumbar radicular pain (L5 dermatome) (Bilateral) (L>R) 08/14/2015  . Lumbar facet syndrome (Bilateral) (L>R) 08/14/2015  . Lumbar spondylosis 08/14/2015  . 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 Lumbar Postlaminectomy syndrome (L4-5) 08/14/2015  . Fibromyalgia 08/14/2015  . Chronic airway obstruction (HCC) 04/21/2015  . Allergic rhinitis 02/10/2010  . Atrial paroxysmal tachycardia (HCC) 07/06/2009  . CAFL (chronic airflow limitation) (HCC) 07/06/2009  . Acid reflux 07/06/2009  . Glaucoma 07/06/2009  . Benign essential HTN 07/06/2009  . Hypercholesterolemia without hypertriglyceridemia 07/06/2009  . Absence of bladder continence 07/06/2009   Allergies  Allergen Reactions  . Codeine Itching  . Cyclobenzaprine Itching  . Penicillins     Itching without rash per patient.  Unable to say when it happened, other than "a long time ago"   Patient tolerating piperacillin without difficulty  No  current facility-administered medications on file prior to encounter.   Current Outpatient Medications on File Prior to  Encounter  Medication Sig Dispense Refill  . albuterol (PROVENTIL) (2.5 MG/3ML) 0.083% nebulizer solution Take 3 mLs (2.5 mg total) by nebulization every 6 (six) hours as needed for wheezing or shortness of breath. 150 mL 1  . aspirin 325 MG tablet Take 1 tablet (325 mg total) by mouth daily. 90 tablet 3  . atorvastatin (LIPITOR) 40 MG tablet Take 1 tablet (40 mg total) by mouth daily. 90 tablet 0  . diltiazem (CARDIZEM) 120 MG tablet Take 1 tablet (120 mg total) by mouth daily. 90 tablet 0  . dorzolamide (TRUSOPT) 2 % ophthalmic solution Place 1 drop into both eyes 3 (three) times daily. (Patient taking differently: Place 1 drop into both eyes 2 (two) times daily. ) 10 mL 3  . Fluticasone-Salmeterol (ADVAIR DISKUS) 250-50 MCG/DOSE AEPB USE 1 INHALATION TWICE A DAY 180 each 3  . furosemide (LASIX) 20 MG tablet TAKE 1 TABLET DAILY 90 tablet 1  . HYDROcodone-acetaminophen (NORCO) 7.5-325 MG tablet Take 1 tablet by mouth every 6 (six) hours as needed for severe pain. Must last 30 days 120 tablet 0  . Lactobacillus-Inulin (CULTURELLE ULTIMATE STRENGTH) CAPS Take 1 capsule by mouth daily. 20 capsule 2  . latanoprost (XALATAN) 0.005 % ophthalmic solution Place 1 drop into both eyes at bedtime. 2.5 mL 3  . levofloxacin (LEVAQUIN) 500 MG tablet Take 1 tablet (500 mg total) by mouth daily. 10 tablet 0  . magnesium gluconate (MAGONATE) 500 MG tablet Take 500 mg by mouth daily.    . meloxicam (MOBIC) 15 MG tablet TAKE 1 TABLET DAILY 90 tablet 1  . moxifloxacin (AVELOX) 400 MG tablet Take 1 tablet (400 mg total) by mouth daily at 8 pm. 10 tablet 0  . Multiple Vitamin (MULTI-VITAMIN) tablet Take 1 tablet by mouth daily.     Marland Kitchen omeprazole (PRILOSEC) 40 MG capsule Take 1 capsule (40 mg total) by mouth daily. 90 capsule 3  . pregabalin (LYRICA) 150 MG capsule Take 1 capsule (150 mg total) by mouth every 8 (eight) hours. 270 capsule 1  . ramipril (ALTACE) 10 MG capsule TAKE 1 CAPSULE DAILY 30 capsule 0  .  rOPINIRole (REQUIP) 2 MG tablet Take 1 tablet (2 mg total) by mouth 2 (two) times daily. 180 tablet 2  . spironolactone (ALDACTONE) 25 MG tablet Take 1 tablet (25 mg total) by mouth daily. 90 tablet 0  . vitamin B-12 (CYANOCOBALAMIN) 500 MCG tablet Take 1 tablet (500 mcg total) by mouth daily. 90 tablet 3  . HYDROcodone-acetaminophen (NORCO) 7.5-325 MG tablet Take 1 tablet by mouth every 6 (six) hours as needed for severe pain. Must last 30 days 120 tablet 0  . [START ON 10/05/2020] HYDROcodone-acetaminophen (NORCO) 7.5-325 MG tablet Take 1 tablet by mouth every 6 (six) hours as needed for severe pain. Must last 30 days (Patient not taking: Reported on 09/04/2020) 120 tablet 0  . linaclotide (LINZESS) 145 MCG CAPS capsule Take 1 capsule (145 mcg total) by mouth daily before breakfast. (Patient not taking: Reported on 09/04/2020) 90 capsule 3        Objective:   Physical Exam BP 111/73 (BP Location: Left Arm)   Pulse 75   Temp 98.4 F (36.9 C) (Oral)   Resp 17   Ht 4\' 10"  (1.473 m)   Wt 39.5 kg   SpO2 98%   BMI 18.18 kg/m  GENERAL: Small statured woman, no acute distress.  Sitting up in bed, no distress. HEAD: Normocephalic, atraumatic.  EYES: Pupils equal, round, reactive to light.  No scleral icterus.  MOUTH: No thrush noted.  Oral mucosa moist. NECK: Supple. No thyromegaly. Trachea midline. No JVD.  No adenopathy. PULMONARY: Good air entry bilaterally.  Coarse breath sounds, no wheezes. CARDIOVASCULAR: S1 and S2. Regular rate and rhythm.  No rubs, murmurs or gallops heard. ABDOMEN: Benign. 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  NEUROLOGIC: No overt focal deficit.  Speech is fluent SKIN: Intact,warm,dry. PSYCH: Mood and behavior normal.      Assessment & Plan:  RUL cavitary mass: lung abscess Chronic aspiration secondary to significant dysphagia  Hx: Current everyday smoker and COPD  Scheduled  and prn bronchodilator therapy  Smoking cessation counseling provided  Trend WBC and monitor fever curve  Trend PCT  Follow cultures  Continue abx therapy can switch to Augmentin p.o. Esophagogram was normal today Thrush/dysphagia better continue antifungal  New onset atrial fibrillation with rvr  Hypotension likely in the setting of atrial fibrillation and volume depletion secondary to poor po intake and chronic diarrhea Syncopal episode secondary to hypotension  Hx: HTN, HLD Continuous telemetry monitoring  Continue Cardizem Fluid resuscitation to maintain map >65 Cardiology consulted appreciate input  Echo: LVEF 60 to 65%, grade 1 diastolic dysfunction. P.O. intake markedly improved  Hyponatremia  Hypokalemia  Hypomagnesemia Trend BMP/Mag Replace electrolytes as indicated  Monitor UOP Avoid nephrotoxic medications  Continue fluid resuscitation   Dysphagia  GERD  Improved with antifungal and PPI management Continue antifungal and PPI Speech evaluation no evidence of dysphagia  Chronic pain syndrome Back on her chronic pain management protocol Lyrica as per outpatient management Hydrocodone as per outpatient management  ETOH abuse CIWA protocol    Patient will need 21 days antibiotics for lung abscess.  Will reimage and follow-up after that.  We will call the patient with follow-up appointment.  Discussed with Dr. Enedina Finner.   Gailen Shelter, MD [facility]  *This note was dictated using voice recognition software/Dragon.  Despite best efforts to proofread, errors can occur which can change the meaning.  Any change was purely unintentional.

## 2020-09-16 NOTE — TOC Initial Note (Signed)
Transition of Care [facility]) - Initial/Assessment Note    Patient Details  Name: Suzanne Avila MRN: [id number] Date of Birth: 1946-08-30  [facility] CM/SW Contact:    Liliana Cline, LCSW Phone Number: 09/16/2020, 3:12 PM  Clinical Narrative:                CSW spoke with patient. Patient reported she lives with her husband, son, and friend and drives herself to appointments. PCP is [facility]. Pharmacy is [facility]. Patient has a cane that she uses when she goes out of her home. No HH or SNF history. CSW informed patient of consult for SA resources, patient refused. Patient denied any additional needs at this time. CSW will continue to follow.   Expected Discharge Plan: Home/Self Care Barriers to Discharge: Continued Medical Work up   Patient Goals and CMS Choice Patient states their goals for this hospitalization and ongoing recovery are:: to return home with family CMS Medicare.gov Compare Post Acute Care list provided to:: Patient Choice offered to / list presented to : Patient  Expected Discharge Plan and Services Expected Discharge Plan: Home/Self Care       Living arrangements for the past 2 months: Single Family Home                                      Prior Living Arrangements/Services Living arrangements for the past 2 months: Single Family Home Lives with:: Spouse, Adult Children, Friends Patient language and need for interpreter reviewed:: Yes Do you feel safe going back to the place where you live?: Yes      Need for Family Participation in Patient Care: Yes (Comment) Care giver support system in place?: Yes (comment) Current home services: DME Criminal Activity/Legal Involvement Pertinent to Current Situation/Hospitalization: No - Comment as needed  Activities of Daily Living Home Assistive Devices/Equipment: None ADL Screening (condition at time of admission) Patient's cognitive ability adequate to safely complete  daily activities?: Yes Is the patient deaf or have difficulty hearing?: No Does the patient have difficulty seeing, even when wearing glasses/contacts?: No Does the patient have difficulty concentrating, remembering, or making decisions?: No Patient able to express need for assistance with ADLs?: No Does the patient have difficulty dressing or bathing?: No Independently performs ADLs?: Yes (appropriate for developmental age) Does the patient have difficulty walking or climbing stairs?: No Weakness of Legs: Both Weakness of Arms/Hands: None  Permission Sought/Granted                  Emotional Assessment       Orientation: : Oriented to Situation, Oriented to  Time, Oriented to Place, Oriented to Self Alcohol / Substance Use: Not Applicable Psych Involvement: No (comment)  Admission diagnosis:  Cavitary lesion of lung [J98.4] Syncope, unspecified syncope type [R55] Abscess of right lung with pneumonia (HCC) [J85.1] Abscess of upper lobe of right lung with pneumonia (HCC) [J85.1] Leukocytosis, unspecified type [D72.829] Patient Active Problem List   Diagnosis Date Noted  . Cavitating mass in right upper lung lobe 09/15/2020  . AF (paroxysmal atrial fibrillation) (HCC) 09/15/2020  . Failure to thrive in adult 09/15/2020  . Severe protein-calorie malnutrition [name]: less than 60% of standard weight) (HCC) 09/15/2020  . Dysphagia 09/15/2020  . Hypotension 09/15/2020  . Abscess of right lung with pneumonia (HCC) 09/14/2020  . Chronic low back pain (Left) w/o sciatica 09/10/2020  . Pharmacologic  therapy 05/05/2020  . Chronic diarrhea 05/04/2020  . DDD (degenerative disc disease), lumbosacral 11/19/2019  . Therapeutic opioid-induced constipation (OIC) 08/01/2019  . Chronic sacroiliac joint pain (Left) 05/01/2019  . Chronic groin pain (Left) 05/01/2019  . Spondylosis without myelopathy or radiculopathy, lumbosacral region 05/01/2019  . Other specified dorsopathies, sacral and  sacrococcygeal region 05/01/2019  . Renal artery stenosis (HCC) 12/27/2017  . Pain in limb 12/22/2017  . Tobacco use disorder 12/22/2017  . Renal atrophy, right 12/22/2017  . Thoracic radiculitis 01/31/2017  . Chronic lower extremity pain (Primary Area of Pain) (Bilateral) (L>R) 04/25/2016  . Chronic upper extremity pain (Third area of Pain) (Bilateral) (L>R) 04/25/2016  . Disturbance of skin sensation 04/20/2016  . Neurogenic pain 04/20/2016  . Chronic hip pain (Bilateral) (L>R) 11/11/2015  . Vitamin D deficiency 11/11/2015  . Edema 09/02/2015  . 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 Encounter for therapeutic drug level monitoring 08/14/2015  . Long term current use of opiate analgesic 08/14/2015  . Long term prescription opiate use 08/14/2015  . Uncomplicated opioid dependence (HCC) 08/14/2015  . Opiate use (30 MME/Day) 08/14/2015  . Chronic low back pain (2ry area of Pain) (Bilateral) (L>R) w/ sciatica (Bilateral) 08/14/2015  . Chronic pain syndrome 08/14/2015  . Alcoholic (HCC) 08/14/2015  . B12 deficiency 08/14/2015  . Narrowing of intervertebral disc space 08/14/2015  . Encounter for screening for diabetes mellitus 08/14/2015  . History of metabolic disorder 08/14/2015  . Fungal infection of toenail 08/14/2015  . Osteopenia 08/14/2015  . Compulsive tobacco user syndrome 08/14/2015  . Osteoarthritis of hip (Left) 08/14/2015  . Chronic hip pain (Left) 08/14/2015  . Chronic lumbar radicular pain (L5 dermatome) (Bilateral) (L>R) 08/14/2015  . Lumbar facet syndrome (Bilateral) (L>R) 08/14/2015  . Lumbar spondylosis 08/14/2015  . 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 Lumbar Postlaminectomy syndrome (L4-5) 08/14/2015  . Fibromyalgia 08/14/2015  . Chronic airway obstruction (HCC) 04/21/2015  . Allergic rhinitis 02/10/2010  . Atrial paroxysmal tachycardia (HCC) 07/06/2009  . CAFL (chronic airflow limitation) (HCC) 07/06/2009  . Acid reflux  07/06/2009  . Glaucoma 07/06/2009  . Benign essential HTN 07/06/2009  . Hypercholesterolemia without hypertriglyceridemia 07/06/2009  . Absence of bladder continence 07/06/2009   PCP:  Margaretann Loveless, PA-C Pharmacy:   [facility] - [place], [place] - [address] [place] [address] Phone: [phone] Fax: [phone]  [facility]IV[facility] - [place], [place] - [address] [place] [address] Phone: [phone] Fax: [phone]     Social Determinants of Health (SDOH) Interventions    Readmission Risk Interventions No flowsheet data found.

## 2020-09-17 DIAGNOSIS — J984 Other disorders of lung: Secondary | ICD-10-CM | POA: Diagnosis not present

## 2020-09-17 DIAGNOSIS — I480 Paroxysmal atrial fibrillation: Secondary | ICD-10-CM | POA: Diagnosis not present

## 2020-09-17 DIAGNOSIS — I10 Essential (primary) hypertension: Secondary | ICD-10-CM

## 2020-09-17 DIAGNOSIS — R1319 Other dysphagia: Secondary | ICD-10-CM | POA: Diagnosis not present

## 2020-09-17 LAB — BASIC METABOLIC PANEL
Anion gap: 7 (ref 5–15)
BUN: 9 mg/dL (ref 8–23)
CO2: 20 mmol/L — ABNORMAL LOW (ref 22–32)
Calcium: 8.1 mg/dL — ABNORMAL LOW (ref 8.9–10.3)
Chloride: 106 mmol/L (ref 98–111)
Creatinine, Ser: 0.56 mg/dL (ref 0.44–1.00)
GFR, Estimated: >60 mL/min (ref >60)
Glucose, Bld: 74 mg/dL (ref 70–99)
Potassium: 3.8 mmol/L (ref 3.5–5.1)
Sodium: 133 mmol/L — ABNORMAL LOW (ref 135–145)

## 2020-09-17 LAB — PHOSPHORUS: Phosphorus: 2.5 mg/dL (ref 2.5–4.6)

## 2020-09-17 LAB — MAGNESIUM: Magnesium: 1.6 mg/dL — ABNORMAL LOW (ref 1.7–2.4)

## 2020-09-17 MED ORDER — AMOXICILLIN-POT CLAVULANATE 875-125 MG PO TABS: 1.0000 | ORAL_TABLET | Freq: Two times a day (BID) | ORAL | 0 refills | Status: AC

## 2020-09-17 MED ORDER — FLUCONAZOLE 100 MG PO TABS
100.0000 mg | ORAL_TABLET | Freq: Every day | ORAL | 0 refills | Status: DC
Start: 2020-09-17 — End: 2020-10-21

## 2020-09-17 MED ORDER — RAMIPRIL 2.5 MG PO CAPS: 2.5000 mg | ORAL_CAPSULE | Freq: Every day | ORAL | 11 refills | Status: DC

## 2020-09-17 MED ORDER — PANTOPRAZOLE SODIUM 40 MG PO TBEC
40.0000 mg | DELAYED_RELEASE_TABLET | Freq: Every day | ORAL | Status: DC
  Administered 2020-09-17: 40 mg via ORAL
  Filled 2020-09-17: qty 1

## 2020-09-17 MED ORDER — PANCRELIPASE (LIP-PROT-AMYL) 24000-76000 UNITS PO CPEP: 24000.0000 [IU] | ORAL_CAPSULE | Freq: Three times a day (TID) | ORAL | 1 refills | Status: DC

## 2020-09-17 MED ORDER — ENSURE ENLIVE PO LIQD
237.0000 mL | Freq: Two times a day (BID) | ORAL | 12 refills | Status: DC
Start: 2020-09-17 — End: 2020-10-21

## 2020-09-17 MED ORDER — MAGNESIUM SULFATE 2 GM/50ML IV SOLN
2.0000 g | Freq: Once | INTRAVENOUS | Status: AC
  Administered 2020-09-17: 2 g via INTRAVENOUS
  Filled 2020-09-17: qty 50

## 2020-09-17 NOTE — Consult Note (Signed)
PHARMACY CONSULT NOTE - FOLLOW UP  Pharmacy Consult for Electrolyte Monitoring and Replacement   Recent Labs: Potassium (mmol/L)  Date Value  09/17/2020 3.8  03/22/2014 2.9 (L)   Magnesium (mg/dL)  Date Value  35/57/3220 1.6 (L)   Calcium (mg/dL)  Date Value  25/42/7062 8.1 (L)   Calcium, Total (mg/dL)  Date Value  37/62/8315 9.2   Albumin (g/dL)  Date Value  17/61/6073 2.1 (L)  02/03/2020 4.2  03/22/2014 3.9   Phosphorus (mg/dL)  Date Value  71/03/2693 2.5   Sodium (mmol/L)  Date Value  09/17/2020 133 (L)  02/03/2020 137  03/22/2014 135 (L)     Assessment: 74 y.o.femalewith a known history of COPD, glaucoma, hypertension, dyslipidemia and cavitary right upper lobe mass, with acute onset of syncope found to have atrial fibrillation with RVR with mild elevation of troponin.  Heart rate is improved.    Pharmacy has been consulted to monitor and replenish electrolytes.  Goal of Therapy:  Electrolytes wnl's  Plan:  Mg 2 g IV x 1  F/u with AM labs.   Ronnald Ramp ,PharmD Clinical Pharmacist 09/17/2020 7:27 AM

## 2020-09-17 NOTE — Discharge Summary (Signed)
[facility] [place] [facility]   PATIENT NAME: Suzanne Avila    MR#:  [id number]  DATE OF BIRTH:  [birth year]  DATE OF ADMISSION:  09/14/2020 ADMITTING PHYSICIAN: Hannah BeatJan A Mansy, MD  DATE OF DISCHARGE: 09/17/2020  PRIMARY CARE PHYSICIAN: Margaretann LovelessBurnette, Jennifer M, PA-C    ADMISSION DIAGNOSIS:  Cavitary lesion of lung [J98.4] Syncope, unspecified syncope type [R55] Abscess of right lung with pneumonia (HCC) [J85.1] Abscess of upper lobe of right lung with pneumonia (HCC) [J85.1] Leukocytosis, unspecified type [D72.829]  DISCHARGE DIAGNOSIS:  Right upper lobe cavitary mass/H/o smoking Afib with RVR--resolved Dysphagia suspected due to Thrush/Esophagitis--improved Electrolyte abnormality--repelted Dyspshgia SECONDARY DIAGNOSIS:   Past Medical History:  Diagnosis Date  . Closed left arm fracture   . COPD (chronic obstructive pulmonary disease) (HCC)   . Glaucoma   . Hyperlipidemia   . Hypertension   . Lung mass     HOSPITAL COURSE:   JanetCallahanis a74 y.o.Caucasian femalewith a known history of COPD, glaucoma, hypertension, dyslipidemia and cavitary right upper lobe mass, who presented to the emergency room with acute onset of syncope while getting off of a barstool with subsequent fall to the left side without injuries. Patient also has been complaining of dysphagia for few month, recently swallowing has been getting better.  1. Right upper lobe cavitary mass - Differential including abscess, bronchogenic carcinoma. -Patient covered with meropenem, vancomycin and Zithromax--IV unasyn--po augmentin -I spoke with Dr. Jayme CloudGonzalez who follows pt has out pt. -pt to f/u Dr Smith Robertao also  2. Acute atrial fibrillation with rapid ventricular response--on admission Patient received IV diltiazem.--change to po cardizem CD120 mg qd with hold parameters -HR stable in the 70's and in SR on tele -I have d/ced Amiodarone since pt needs po diflucan  for  thrush/esophagitis -patient's heart rate is stable. -Patient seen by Dr. Welton FlakesKhan. She will follow-up outpatient with cardiology. They do recommend anticoagulation however patient need bronchoscopy and hence holding for now. Patient also is in sinus rhythm.  #3. Failure to thrive/Dyshpagia likley due to esophagitis candida  oral thrush Severe protein calorie malnutrition. Chronic diarrhea. -Weight loss. -Change to oral Diflucan from today-- total 10 days - DG swallow Esophagogram-- no stricture. Appears normal. -Will follow-up with G.I. Dr. Tobi BastosAnna as outpatient. -C diff negative - swallowing much improved  #4. Chronic anemia. -continue MVI  #5. Hypokalemia, hypomagnesemia,mild hypophosphatemia. -Supplemented by pharmacy  #6. Syncope with hypotension Likely due to volume depletion. Received fluids. --decreased ramipril to 2.5 mg qd and cont cardizem with holding parameter. If BP remains low/soft PCP to adjust meds   # 8.COPD. Stable. -Sats 97 % on room air            Appreciate TOC for d/c planing DVT prophylaxis:Lovenox Code Status:Full Family Communication: son Fayrene FearingJames updated on the phone  disposition Plan: to home today   Status is: Inpatient   Dispo: The patient is from:Home Anticipated d/c is WU:JWJXto:Home Anticipated d/c date is:11/25 Patient currently is medically stable to d/c.pt best at baseline    CONSULTS OBTAINED:    DRUG ALLERGIES:   Allergies  Allergen Reactions  . Codeine Itching  . Cyclobenzaprine Itching  . Penicillins     Itching without rash per patient.  Unable to say when it happened, other than "a long time ago"    DISCHARGE MEDICATIONS:   Allergies as of 09/17/2020      Reactions   Codeine Itching   Cyclobenzaprine Itching   Penicillins    Itching without rash per patient.  Unable to say when it happened, other than "a long time ago"      Medication List    STOP  taking these medications   levofloxacin 500 MG tablet Commonly known as: LEVAQUIN   linaclotide 145 MCG Caps capsule Commonly known as: LINZESS   meloxicam 15 MG tablet Commonly known as: MOBIC   metroNIDAZOLE 500 MG tablet Commonly known as: Flagyl   moxifloxacin 400 MG tablet Commonly known as: AVELOX     TAKE these medications   albuterol (2.5 MG/3ML) 0.083% nebulizer solution Commonly known as: PROVENTIL Take 3 mLs (2.5 mg total) by nebulization every 6 (six) hours as needed for wheezing or shortness of breath.   amoxicillin-clavulanate 875-125 MG tablet Commonly known as: AUGMENTIN Take 1 tablet by mouth every 12 (twelve) hours for 7 days.   aspirin 325 MG tablet Take 1 tablet (325 mg total) by mouth daily.   atorvastatin 40 MG tablet Commonly known as: LIPITOR Take 1 tablet (40 mg total) by mouth daily.   Culturelle Ultimate Strength Caps Take 1 capsule by mouth daily.   diltiazem 120 MG tablet Commonly known as: CARDIZEM Take 1 tablet (120 mg total) by mouth daily. Notes to patient: HOLD IF YOUR SBP is <120   dorzolamide 2 % ophthalmic solution Commonly known as: TRUSOPT Place 1 drop into both eyes 3 (three) times daily. What changed: when to take this   feeding supplement Liqd Take 237 mLs by mouth 2 (two) times daily between meals.   fluconazole 100 MG tablet Commonly known as: DIFLUCAN Take 1 tablet (100 mg total) by mouth daily.   Fluticasone-Salmeterol 250-50 MCG/DOSE Aepb Commonly known as: Advair Diskus USE 1 INHALATION TWICE A DAY   furosemide 20 MG tablet Commonly known as: LASIX TAKE 1 TABLET DAILY   HYDROcodone-acetaminophen 7.5-325 MG tablet Commonly known as: Norco Take 1 tablet by mouth every 6 (six) hours as needed for severe pain. Must last 30 days What changed: Another medication with the same name was removed. Continue taking this medication, and follow the directions you see here.   latanoprost 0.005 % ophthalmic  solution Commonly known as: XALATAN Place 1 drop into both eyes at bedtime.   magnesium gluconate 500 MG tablet Commonly known as: MAGONATE Take 500 mg by mouth daily.   Multi-Vitamin tablet Take 1 tablet by mouth daily.   omeprazole 40 MG capsule Commonly known as: PRILOSEC Take 1 capsule (40 mg total) by mouth daily.   Pancrelipase (Lip-Prot-Amyl) 24000-76000 units Cpep Take 1 capsule (24,000 Units total) by mouth 3 (three) times daily before meals.   pregabalin 150 MG capsule Commonly known as: LYRICA Take 1 capsule (150 mg total) by mouth every 8 (eight) hours.   ramipril 2.5 MG capsule Commonly known as: Altace Take 1 capsule (2.5 mg total) by mouth daily. What changed:  medication strength how much to take   rOPINIRole 2 MG tablet Commonly known as: Requip Take 1 tablet (2 mg total) by mouth 2 (two) times daily.   spironolactone 25 MG tablet Commonly known as: Aldactone Take 1 tablet (25 mg total) by mouth daily.   vitamin B-12 500 MCG tablet Commonly known as: CYANOCOBALAMIN Take 1 tablet (500 mcg total) by mouth daily.       If you experience worsening of your admission symptoms, develop shortness of breath, life threatening emergency, suicidal or homicidal thoughts you must seek medical attention immediately by calling 911 or calling your MD immediately  if symptoms less severe.  You Must  read complete instructions/literature along with all the possible adverse reactions/side effects for all the Medicines you take and that have been prescribed to you. Take any new Medicines after you have completely understood and accept all the possible adverse reactions/side effects.   Please note  You were cared for by a hospitalist during your hospital stay. If you have any questions about your discharge medications or the care you received while you were in the hospital after you are discharged, you can call the unit and asked to speak with the hospitalist on call if  the hospitalist that took care of you is not available. Once you are discharged, your primary care physician will handle any further medical issues. Please note that NO REFILLS for any discharge medications will be authorized once you are discharged, as it is imperative that you return to your primary care physician (or establish a relationship with a primary care physician if you do not have one) for your aftercare needs so that they can reassess your need for medications and monitor your lab values. Today   SUBJECTIVE   I am good to go home  VITAL SIGNS:  Blood pressure 113/72, pulse 79, temperature 98.6 F (37 C), resp. rate 20, height 4\' 10"  (1.473 m), weight 39.5 kg, SpO2 97 %.  I/O:  No intake or output data in the 24 hours ending 09/17/20 0757  PHYSICAL EXAMINATION:  GENERAL:  74 y.o.-year-old patient lying in the bed with no acute distress. thin HEENT: Head atraumatic, normocephalic. Oropharynx and nasopharynx clear.  LUNGS: Normal breath sounds bilaterally, no wheezing, rales,rhonchi or crepitation. No use of accessory muscles of respiration.  CARDIOVASCULAR: S1, S2 normal. No murmurs, rubs, or gallops.  ABDOMEN: Soft, non-tender, non-distended. Bowel sounds present. No organomegaly or mass.  EXTREMITIES: No pedal edema, cyanosis, or clubbing.  NEUROLOGIC: Cranial nerves II through XII are intact. Muscle strength 5/5 in all extremities. Sensation intact. Gait not checked.  PSYCHIATRIC: The patient is alert and oriented x 3.  SKIN: No obvious rash, lesion, or ulcer.   DATA REVIEW:   CBC  Recent Labs  Lab 09/15/20 0702  WBC 12.5*  HGB 9.9*  HCT 27.5*  PLT 321    Chemistries  Recent Labs  Lab 09/16/20 0540 09/16/20 0540 09/17/20 0545  NA 137   < > 133*  K 3.8   < > 3.8  CL 112*   < > 106  CO2 19*   < > 20*  GLUCOSE 121*   < > 74  BUN 11   < > 9  CREATININE 0.77   < > 0.56  CALCIUM 8.0*   < > 8.1*  MG 2.1   < > 1.6*  AST 18  --   --   ALT 13  --   --    ALKPHOS 54  --   --   BILITOT 0.6  --   --    < > = values in this interval not displayed.    Microbiology Results   Recent Results (from the past 240 hour(s))  Resp Panel by RT-PCR (Flu A&B, Covid) Nasopharyngeal Swab     Status: None   Collection Time: 09/14/20 10:47 PM   Specimen: Nasopharyngeal Swab; Nasopharyngeal(NP) swabs in vial transport medium  Result Value Ref Range Status   SARS Coronavirus 2 by RT PCR NEGATIVE NEGATIVE Final    Comment: (NOTE) SARS-CoV-2 target nucleic acids are NOT DETECTED.  The SARS-CoV-2 RNA is generally detectable in upper respiratory specimens during the  acute phase of infection. The lowest concentration of SARS-CoV-2 viral copies this assay can detect is 138 copies/mL. A negative result does not preclude SARS-Cov-2 infection and should not be used as the sole basis for treatment or other patient management decisions. A negative result may occur with  improper specimen collection/handling, submission of specimen other than nasopharyngeal swab, presence of viral mutation(s) within the areas targeted by this assay, and inadequate number of viral copies(<138 copies/mL). A negative result must be combined with clinical observations, patient history, and epidemiological information. The expected result is Negative.  Fact Sheet for Patients:  BloggerCourse.com  Fact Sheet for Healthcare Providers:  SeriousBroker.it  This test is no t yet approved or cleared by the [place] FDA and  has been authorized for detection and/or diagnosis of SARS-CoV-2 by FDA under an Emergency Use Authorization (EUA). This EUA will remain  in effect (meaning this test can be used) for the duration of the COVID-19 declaration under Section 564(b)(1) of the Act, 21 U.S.C.section 360bbb-3(b)(1), unless the authorization is terminated  or revoked sooner.       Influenza A by PCR NEGATIVE NEGATIVE Final    Influenza B by PCR NEGATIVE NEGATIVE Final    Comment: (NOTE) The Xpert Xpress SARS-CoV-2/FLU/RSV plus assay is intended as an aid in the diagnosis of influenza from Nasopharyngeal swab specimens and should not be used as a sole basis for treatment. Nasal washings and aspirates are unacceptable for Xpert Xpress SARS-CoV-2/FLU/RSV testing.  Fact Sheet for Patients: BloggerCourse.com  Fact Sheet for Healthcare Providers: SeriousBroker.it  This test is not yet approved or cleared by the [place] FDA and has been authorized for detection and/or diagnosis of SARS-CoV-2 by FDA under an Emergency Use Authorization (EUA). This EUA will remain in effect (meaning this test can be used) for the duration of the COVID-19 declaration under Section 564(b)(1) of the Act, 21 U.S.C. section 360bbb-3(b)(1), unless the authorization is terminated or revoked.  Performed at [facility], [address] [place], [place] [address]   Blood culture (routine x 2)     Status: None (Preliminary result)   Collection Time: 09/14/20 10:47 PM   Specimen: BLOOD  Result Value Ref Range Status   Specimen Description BLOOD BLOOD RIGHT HAND  Final   Special Requests   Final    BOTTLES DRAWN AEROBIC AND ANAEROBIC Blood Culture results may not be optimal due to an excessive volume of blood received in culture bottles   Culture   Final    NO GROWTH 3 DAYS Performed at [facility], [address] [place], [place] [address]    Report Status PENDING  Incomplete  Blood culture (routine x 2)     Status: None (Preliminary result)   Collection Time: 09/14/20 10:48 PM   Specimen: BLOOD  Result Value Ref Range Status   Specimen Description BLOOD RIGHT ANTECUBITAL  Final   Special Requests   Final    BOTTLES DRAWN AEROBIC AND ANAEROBIC Blood Culture adequate volume   Culture   Final    NO GROWTH 3 DAYS Performed at [facility], [address] [place], [place] [address]    Report Status PENDING  Incomplete  MRSA PCR Screening     Status: None   Collection Time: 09/15/20  3:30 AM   Specimen: Nasopharyngeal  Result Value Ref Range Status   MRSA by PCR NEGATIVE NEGATIVE Final    Comment:        The GeneXpert MRSA Assay (FDA  approved for NASAL specimens only), is one component of a comprehensive MRSA colonization surveillance program. It is not intended to diagnose MRSA infection nor to guide or monitor treatment for MRSA infections. Performed at [facility], [address] [place], [place] [address]   C Difficile Quick Screen w PCR reflex     Status: None   Collection Time: 09/15/20  9:11 AM   Specimen: STOOL  Result Value Ref Range Status   C Diff antigen NEGATIVE NEGATIVE Final   C Diff toxin NEGATIVE NEGATIVE Final   C Diff interpretation No C. difficile detected.  Final    Comment: Performed at [facility], [address] [place] [place], [place] [address]    RADIOLOGY:  ECHOCARDIOGRAM COMPLETE  Result Date: 09/15/2020    ECHOCARDIOGRAM REPORT   Patient Name:   MADASON RAULS Date of Exam: 09/15/2020 Medical Rec #:  [id number]        Height:       58.0 in Accession #:    [id number]       Weight:       87.0 lb Date of Birth:  07-Mar-1946        BSA:          1.278 m Patient Age:    74 years         BP:           108/66 mmHg Patient Gender: F                HR:           86 bpm. Exam Location:  [facility] Procedure: 2D Echo, Color Doppler and Cardiac Doppler Indications:     I48.91 Atrial Fibrillation  History:         Patient has no prior history of Echocardiogram examinations.                  COPD; Risk Factors:Hypertension and Dyslipidemia.  Sonographer:     Humphrey Rolls RDCS (AE) Referring Phys:  [id number] Vernetta Honey MANSY Diagnosing Phys: Adrian Blackwater MD IMPRESSIONS  1. Left ventricular ejection fraction, by estimation, is 60 to 65%. The left ventricle has normal function. The left ventricle has no  regional wall motion abnormalities. Left ventricular diastolic parameters are consistent with Grade I diastolic dysfunction (impaired relaxation).  2. Right ventricular systolic function is normal. The right ventricular size is normal.  3. Left atrial size was moderately dilated.  4. Right atrial size was moderately dilated.  5. The mitral valve is normal in structure. Mild mitral valve regurgitation. No evidence of mitral stenosis. Moderate mitral annular calcification.  6. The aortic valve is normal in structure. Aortic valve regurgitation is not visualized. No aortic stenosis is present.  7. The inferior vena cava is normal in size with greater than 50% respiratory variability, suggesting right atrial pressure of 3 mmHg. FINDINGS  Left Ventricle: Left ventricular ejection fraction, by estimation, is 60 to 65%. The left ventricle has normal function. The left ventricle has no regional wall motion abnormalities. The left ventricular internal cavity size was normal in size. There is  no left ventricular hypertrophy. Left ventricular diastolic parameters are consistent with Grade I diastolic dysfunction (impaired relaxation). Right Ventricle: The right ventricular size is normal. No increase in right ventricular wall thickness. Right ventricular systolic function is normal. Left Atrium: Left atrial size was moderately dilated. Right Atrium: Right atrial size was moderately dilated. Pericardium: There is no evidence of pericardial effusion. Mitral Valve: The  mitral valve is normal in structure. Moderate mitral annular calcification. Mild mitral valve regurgitation. No evidence of mitral valve stenosis. MV peak gradient, 7.4 mmHg. The mean mitral valve gradient is 4.0 mmHg. Tricuspid Valve: The tricuspid valve is normal in structure. Tricuspid valve regurgitation is mild . No evidence of tricuspid stenosis. Aortic Valve: The aortic valve is normal in structure. Aortic valve regurgitation is not visualized. No aortic  stenosis is present. Aortic valve mean gradient measures 5.0 mmHg. Aortic valve peak gradient measures 9.7 mmHg. Pulmonic Valve: The pulmonic valve was normal in structure. Pulmonic valve regurgitation is not visualized. No evidence of pulmonic stenosis. Aorta: The aortic root is normal in size and structure. Venous: The inferior vena cava is normal in size with greater than 50% respiratory variability, suggesting right atrial pressure of 3 mmHg. IAS/Shunts: No atrial level shunt detected by color flow Doppler.  LEFT VENTRICLE PLAX 2D LVIDd:         3.85 cm Diastology LVIDs:         2.08 cm LV e' medial:    7.18 cm/s LV PW:         1.07 cm LV E/e' medial:  18.2 LV IVS:        0.78 cm LV e' lateral:   7.62 cm/s                        LV E/e' lateral: 17.2  RIGHT VENTRICLE RV Basal diam:  3.04 cm LEFT ATRIUM           Index       RIGHT ATRIUM           Index LA diam:      3.70 cm 2.89 cm/m  RA Area:     10.20 cm LA Vol (A4C): 48.3 ml 37.78 ml/m RA Volume:   20.40 ml  15.96 ml/m  AORTIC VALVE                    PULMONIC VALVE AV Vmax:           156.00 cm/s  PV Vmax:       1.18 m/s AV Vmean:          108.000 cm/s PV Vmean:      75.600 cm/s AV VTI:            0.307 m      PV VTI:        0.209 m AV Peak Grad:      9.7 mmHg     PV Peak grad:  5.6 mmHg AV Mean Grad:      5.0 mmHg     PV Mean grad:  3.0 mmHg LVOT Vmax:         106.00 cm/s LVOT Vmean:        68.100 cm/s LVOT VTI:          0.216 m LVOT/AV VTI ratio: 0.70 MITRAL VALVE                TRICUSPID VALVE MV Area (PHT): 3.27 cm     TR Peak grad:   25.0 mmHg MV Peak grad:  7.4 mmHg     TR Vmax:        250.00 cm/s MV Mean grad:  4.0 mmHg MV Vmax:       1.36 m/s     SHUNTS MV Vmean:      93.3 cm/s    Systemic VTI: 0.22 m  MV Decel Time: 232 msec MV E velocity: 131.00 cm/s MV A velocity: 93.00 cm/s MV E/A ratio:  1.41 Adrian Blackwater MD Electronically signed by Adrian Blackwater MD Signature Date/Time: 09/15/2020/12:14:22 PM    Final    DG ESOPHAGUS W SINGLE CM (SOL OR THIN  BA)  Result Date: 09/16/2020 CLINICAL DATA:  Dysphagia, history of right upper lobe cavitary mass EXAM: ESOPHOGRAM/BARIUM SWALLOW TECHNIQUE: Single contrast examination was performed using  thin barium. FLUOROSCOPY TIME:  Fluoroscopy Time:  1 minutes 6 seconds Radiation Exposure Index (if provided by the fluoroscopic device): 17.5 mGy Number of Acquired Spot Images: Multiple cine fluoroscopic runs. COMPARISON:  None. FINDINGS: Swallowing mechanism and esophageal transit time are within normal limits. No mucosal abnormality is noted. No obstructive changes are seen. Small sliding-type hiatal hernia is noted which is variable. No significant reflux is noted. Note is made during fluoroscopy of the patient's known cavitary right upper lobe mass. IMPRESSION: No acute esophageal abnormality noted. Transient sliding-type hiatal hernia. Cavitary right upper lobe mass. Electronically Signed   By: Alcide Clever M.D.   On: 09/16/2020 09:14     CODE STATUS:     Code Status Orders  (From admission, onward)         Start     Ordered   09/15/20 0109  Full code  Continuous        09/15/20 0108        Code Status History    This patient has a current code status but no historical code status.   Advance Care Planning Activity       TOTAL TIME TAKING CARE OF THIS PATIENT: *35* minutes.    Enedina Finner M.D  [facility]    CC: Primary care physician; Margaretann Loveless, PA-C

## 2020-09-19 LAB — CULTURE, BLOOD (ROUTINE X 2)
Culture: NO GROWTH
Culture: NO GROWTH
Special Requests: ADEQUATE

## 2020-09-21 ENCOUNTER — Other Ambulatory Visit: Payer: Medicare Other

## 2020-09-22 ENCOUNTER — Ambulatory Visit
Admission: RE | Admit: 2020-09-22 | Discharge: 2020-09-22 | Disposition: A | Discharge status: Home / Self Care | Payer: Medicare Other | Source: Ambulatory Visit | Attending: Pulmonary Disease | Admitting: Pulmonary Disease

## 2020-09-22 ENCOUNTER — Ambulatory Visit (INDEPENDENT_AMBULATORY_CARE_PROVIDER_SITE_OTHER): Payer: Medicare Other | Admitting: Pulmonary Disease

## 2020-09-22 ENCOUNTER — Ambulatory Visit: Payer: Self-pay | Admitting: *Deleted

## 2020-09-22 ENCOUNTER — Encounter: Payer: Self-pay | Admitting: Pulmonary Disease

## 2020-09-22 ENCOUNTER — Other Ambulatory Visit: Payer: Self-pay

## 2020-09-22 ENCOUNTER — Other Ambulatory Visit: Payer: Self-pay | Admitting: Pulmonary Disease

## 2020-09-22 ENCOUNTER — Ambulatory Visit: Payer: Medicare Other

## 2020-09-22 VITALS — BP 130/78 | HR 92 | Temp 97.9°F | Ht 59.0 in | Wt 88.4 lb

## 2020-09-22 DIAGNOSIS — M25552 Pain in left hip: Secondary | ICD-10-CM

## 2020-09-22 DIAGNOSIS — K209 Esophagitis, unspecified without bleeding: Secondary | ICD-10-CM | POA: Diagnosis not present

## 2020-09-22 DIAGNOSIS — J449 Chronic obstructive pulmonary disease, unspecified: Secondary | ICD-10-CM | POA: Diagnosis not present

## 2020-09-22 DIAGNOSIS — I872 Venous insufficiency (chronic) (peripheral): Secondary | ICD-10-CM

## 2020-09-22 DIAGNOSIS — J984 Other disorders of lung: Secondary | ICD-10-CM

## 2020-09-22 DIAGNOSIS — F17210 Nicotine dependence, cigarettes, uncomplicated: Secondary | ICD-10-CM

## 2020-09-22 MED ORDER — BREZTRI AEROSPHERE 160-9-4.8 MCG/ACT IN AERO: 2.0000 | INHALATION_SPRAY | Freq: Two times a day (BID) | RESPIRATORY_TRACT | 0 refills | Status: AC

## 2020-09-22 MED ORDER — FUROSEMIDE 20 MG PO TABS: 20.0000 mg | ORAL_TABLET | Freq: Every day | ORAL | 0 refills | Status: DC

## 2020-09-22 NOTE — Telephone Encounter (Signed)
FYI

## 2020-09-22 NOTE — Telephone Encounter (Signed)
I returned pt's call.   She is requesting a refill on her "fluid pill, the Lasix".  "My legs are swollen".  She's requesting it be sent to [facility]  So she can pick it up today if possible.  She denies having shortness of breath.   "I've had this before".  "This is nothing new".  I submitted a request to Joycelyn Man, PA-C for a refill on the Lasix 20 mg.  Pt has an appt with Joycelyn Man tomorrow at 3:00 for hospital F/U.    Reason for Disposition . [1] Caller requesting a prescription renewal (no refills left), no triage required, AND [2] triager able to renew prescription per department policy  Answer Assessment - Initial Assessment Questions 1. DRUG NAME: "What medicine do you need to have refilled?"     Lasix 20 mg 2. REFILLS REMAINING: "How many refills are remaining?" (Note: The label on the medicine or pill bottle will show how many refills are remaining. If there are no refills remaining, then a renewal may be needed.)     Not sure 3. EXPIRATION DATE: "What is the expiration date?" (Note: The label states when the prescription will expire, and thus can no longer be refilled.)     Not sure 4. PRESCRIBING HCP: "Who prescribed it?" Reason: If prescribed by specialist, call should be referred to that group.     [employer] 5. SYMPTOMS: "Do you have any symptoms?"     "My legs are swollen".   She denies shortness of breath.    "I've had this before". 6. PREGNANCY: "Is there any chance that you are pregnant?" "When was your last menstrual period?"     N/A due to age  Protocols used: MEDICATION REFILL AND RENEWAL CALL-A-AH

## 2020-09-22 NOTE — Progress Notes (Signed)
Subjective:    Patient ID: Suzanne Avila, female    DOB: 1946-05-08, 74 y.o.   MRN: [id number]  HPI Suzanne Avila is a 74 year old current smoker (1 PPD) who presents for follow-up on the issue of a right lung abscess/mass.  She was initially evaluated here on 04 September 2020 and subsequently had to be admitted to [facility] after a syncopal episode which was deemed to have been due to volume depletion and A. fib with RVR.  She was admitted on 22 November through the 25 November.  She was noted to have significant dysphagia and had had decreased in her p.o. intake.  She was also noted to have severe thrush and was treated for thrush and potential Candida esophagitis.  She did very well with fluconazole therapy regaining her ability to swallow without difficulty.  She is on antibiotics for a right cavitary mass that appears to be a lung abscess.  She had had issues with aspiration prior to admission.  She is currently on Augmentin and on fluconazole.  She is completing courses on both.  Her only complaint today is that of left hip pain after a fall she had prior to admission.  She also has some lower extremity edema but states that she has not been taking her diuretic in the last few days.  Not had any fevers, chills or sweats.  She is using Clinical cytogeneticist for management of her COPD.  She was unable to get PFTs performed as she was hospitalized.  We will defer those for now as she has multiple issues to be attended to.  Son states that she was supposed to get physical therapy at home however this has not occurred.  I have encouraged him to discuss these issues with her primary care practitioner whom she will see tomorrow.  Review of Systems A 10 point review of systems was performed and it is as noted above otherwise negative.  Patient Active Problem List   Diagnosis Date Noted  . Cavitary lesion of lung 09/15/2020  . AF (paroxysmal atrial fibrillation) (HCC) 09/15/2020  . Failure to thrive in adult  09/15/2020  . Severe protein-calorie malnutrition [name]: less than 60% of standard weight) (HCC) 09/15/2020  . Dysphagia 09/15/2020  . Hypotension 09/15/2020  . Abscess of right lung with pneumonia (HCC) 09/14/2020  . Chronic low back pain (Left) w/o sciatica 09/10/2020  . Pharmacologic therapy 05/05/2020  . Chronic diarrhea 05/04/2020  . DDD (degenerative disc disease), lumbosacral 11/19/2019  . Therapeutic opioid-induced constipation (OIC) 08/01/2019  . Chronic sacroiliac joint pain (Left) 05/01/2019  . Chronic groin pain (Left) 05/01/2019  . Spondylosis without myelopathy or radiculopathy, lumbosacral region 05/01/2019  . Other specified dorsopathies, sacral and sacrococcygeal region 05/01/2019  . Renal artery stenosis (HCC) 12/27/2017  . Pain in limb 12/22/2017  . Tobacco use disorder 12/22/2017  . Renal atrophy, right 12/22/2017  . Thoracic radiculitis 01/31/2017  . Chronic lower extremity pain (Primary Area of Pain) (Bilateral) (L>R) 04/25/2016  . Chronic upper extremity pain (Third area of Pain) (Bilateral) (L>R) 04/25/2016  . Disturbance of skin sensation 04/20/2016  . Neurogenic pain 04/20/2016  . Chronic hip pain (Bilateral) (L>R) 11/11/2015  . Vitamin D deficiency 11/11/2015  . Edema 09/02/2015  . 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 Encounter for therapeutic drug level monitoring 08/14/2015  . Long term current use of opiate analgesic 08/14/2015  . Long term prescription opiate use 08/14/2015  . Uncomplicated opioid dependence (HCC) 08/14/2015  . Opiate use (30 MME/Day)  08/14/2015  . Chronic low back pain (2ry area of Pain) (Bilateral) (L>R) w/ sciatica (Bilateral) 08/14/2015  . Chronic pain syndrome 08/14/2015  . Alcoholic (HCC) 08/14/2015  . B12 deficiency 08/14/2015  . Narrowing of intervertebral disc space 08/14/2015  . Encounter for screening for diabetes mellitus 08/14/2015  . History of metabolic disorder 08/14/2015  . Fungal infection of  toenail 08/14/2015  . Osteopenia 08/14/2015  . Compulsive tobacco user syndrome 08/14/2015  . Osteoarthritis of hip (Left) 08/14/2015  . Chronic hip pain (Left) 08/14/2015  . Chronic lumbar radicular pain (L5 dermatome) (Bilateral) (L>R) 08/14/2015  . Lumbar facet syndrome (Bilateral) (L>R) 08/14/2015  . Lumbar spondylosis 08/14/2015  . 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 Lumbar Postlaminectomy syndrome (L4-5) 08/14/2015  . Fibromyalgia 08/14/2015  . Chronic airway obstruction (HCC) 04/21/2015  . Allergic rhinitis 02/10/2010  . Atrial paroxysmal tachycardia (HCC) 07/06/2009  . CAFL (chronic airflow limitation) (HCC) 07/06/2009  . Acid reflux 07/06/2009  . Glaucoma 07/06/2009  . Essential hypertension 07/06/2009  . Hypercholesterolemia without hypertriglyceridemia 07/06/2009  . Absence of bladder continence 07/06/2009   Allergies  Allergen Reactions  . Codeine Itching  . Cyclobenzaprine Itching  . Penicillins     Itching without rash per patient.  Unable to say when it happened, other than "a long time ago"   Current Meds  Medication Sig  . albuterol (PROVENTIL) (2.5 MG/3ML) 0.083% nebulizer solution Take 3 mLs (2.5 mg total) by nebulization every 6 (six) hours as needed for wheezing or shortness of breath.  Marland Kitchen. amoxicillin-clavulanate (AUGMENTIN) 875-125 MG tablet Take 1 tablet by mouth every 12 (twelve) hours for 7 days.  Marland Kitchen. aspirin 325 MG tablet Take 1 tablet (325 mg total) by mouth daily.  Marland Kitchen. atorvastatin (LIPITOR) 40 MG tablet Take 1 tablet (40 mg total) by mouth daily.  . Budeson-Glycopyrrol-Formoterol (BREZTRI AEROSPHERE) 160-9-4.8 MCG/ACT AERO Inhale 2 puffs into the lungs in the morning and at bedtime.  Marland Kitchen. diltiazem (CARDIZEM) 120 MG tablet Take 1 tablet (120 mg total) by mouth daily.  . dorzolamide (TRUSOPT) 2 % ophthalmic solution Place 1 drop into both eyes 3 (three) times daily. (Patient taking differently: Place 1 drop into both eyes 2 (two) times  daily. )  . feeding supplement (ENSURE ENLIVE / ENSURE PLUS) LIQD Take 237 mLs by mouth 2 (two) times daily between meals.  . fluconazole (DIFLUCAN) 100 MG tablet Take 1 tablet (100 mg total) by mouth daily.  . furosemide (LASIX) 20 MG tablet TAKE 1 TABLET DAILY  . HYDROcodone-acetaminophen (NORCO) 7.5-325 MG tablet Take 1 tablet by mouth every 6 (six) hours as needed for severe pain. Must last 30 days  . Lactobacillus-Inulin (CULTURELLE ULTIMATE STRENGTH) CAPS Take 1 capsule by mouth daily.  Marland Kitchen. latanoprost (XALATAN) 0.005 % ophthalmic solution Place 1 drop into both eyes at bedtime.  . lipase/protease/amylase 24000-76000 units CPEP Take 1 capsule (24,000 Units total) by mouth 3 (three) times daily before meals.  . magnesium gluconate (MAGONATE) 500 MG tablet Take 500 mg by mouth daily.  . Multiple Vitamin (MULTI-VITAMIN) tablet Take 1 tablet by mouth daily.   Marland Kitchen. omeprazole (PRILOSEC) 40 MG capsule Take 1 capsule (40 mg total) by mouth daily.  . pregabalin (LYRICA) 150 MG capsule Take 1 capsule (150 mg total) by mouth every 8 (eight) hours.  . ramipril (ALTACE) 2.5 MG capsule Take 1 capsule (2.5 mg total) by mouth daily.  Marland Kitchen. rOPINIRole (REQUIP) 2 MG tablet Take 1 tablet (2 mg total) by mouth 2 (  two) times daily.  Marland Kitchen spironolactone (ALDACTONE) 25 MG tablet Take 1 tablet (25 mg total) by mouth daily.  . vitamin B-12 (CYANOCOBALAMIN) 500 MCG tablet Take 1 tablet (500 mcg total) by mouth daily.   Immunization History  Administered Date(s) Administered  . Fluad Quad(high Dose 65+) 08/02/2019, 08/07/2020  . Influenza Split 07/14/2010, 08/16/2011  . Influenza, High Dose Seasonal PF 09/02/2017, 09/27/2018  . Influenza,inj,Quad PF,6+ Mos 07/09/2014  . Moderna SARS-COVID-2 Vaccination 01/02/2020, 01/30/2020  . Pneumococcal Conjugate-13 01/09/2014  . Pneumococcal Polysaccharide-23 12/01/2009, 07/24/2016  . Zoster 05/23/2014  . Zoster Recombinat (Shingrix) 09/02/2017, 01/09/2019, 08/02/2019         Objective:   Physical Exam BP 130/78 (BP Location: Left Arm, Cuff Size: Normal)   Pulse 92   Temp 97.9 F (36.6 C) (Temporal)   Ht 4\' 11"  (1.499 m)   Wt 88 lb 6.4 oz (40.1 kg)   SpO2 98%   BMI 17.85 kg/m  GENERAL: Small statured woman, no acute distress.  In transport chair. HEAD: Normocephalic, atraumatic.  EYES: Pupils equal, round, reactive to light.  No scleral icterus.  MOUTH: Nose/mouth/throat not examined due to masking requirements for COVID 19. NECK: Supple. No thyromegaly. Trachea midline. No JVD.  No adenopathy. PULMONARY: Good air entry bilaterally.  There are scattered rhonchi and end expiratory wheezes throughout, air movement however, is good. CARDIOVASCULAR: S1 and S2. Regular rate and rhythm.  No rubs, murmurs or gallops heard. ABDOMEN: Benign. 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  NEUROLOGIC: No overt focal deficit.  Speech is fluent SKIN: Intact,warm,dry.  No cellulitis changes in the lower extremities. PSYCH: Mood and behavior normal.     Assessment & Plan:     ICD-10-CM   1. Cavitary lesion of lung  J98.4 CT CHEST WO CONTRAST   This is likely a lung abscess Continue Augmentin Reimage in 3 weeks with CT chest  2. COPD mixed type (HCC)  J44.9    Continue Breztri 2 puffs twice a day  3. Left hip pain  M25.552 CANCELED: XR HIP UNILAT W OR W/O PELVIS 2-3 VIEWS LEFT   Imaging to rule out fracture/dislocation  4. Esophagitis  K20.90    Complete fluconazole  5. Tobacco dependence due to cigarettes  F17.210    Recommend to stop smoking Counseled with regards to discontinuation of smoking Total counseling time 3 to 5 minutes   Orders Placed This Encounter  Procedures  . CT CHEST WO CONTRAST    Standing Status:   Future    Standing Expiration Date:   09/22/2021    Scheduling Instructions:     3 weeks    Order Specific  Question:   Preferred imaging location?    Answer:    [facility]   Meds ordered this encounter  Medications  . Budeson-Glycopyrrol-Formoterol (BREZTRI AEROSPHERE) 160-9-4.8 MCG/ACT AERO    Sig: Inhale 2 puffs into the lungs in the morning and at bedtime for 1 day.    Dispense:  5.9 g    Refill:  0    Order Specific Question:   Lot Number?    Answer:   09/24/2021 [id number]    Order Specific Question:   Expiration Date?    Answer:   03/24/2022    Order Specific Question:   Manufacturer?    Answer:   [employer] [[id number]]    Order Specific Question:   Quantity    Answer:  2   Discussion:  Patient was recently hospitalized for syncopal episode that appears to have been due to volume depletion in the setting of severe dysphagia and inability to keep p.o. intake.  She also had been having difficulties with aspiration.  She has a right upper lobe cavitary mass that is likely a lung abscess.  This is being treated currently with Augmentin.  She will need reimaging of the lung in 3 weeks.  We will see her in follow-up after that study is done.  She is a very high risk for bronchoscopy and will need to reassess this with follow-up film prior to determining whether bronchoscopy will be necessary.  She was complaining of left hip pain today apparently she sustained a fall at home on the day of admission.  It does not appear she was imaged.  We will obtain imaging she may need referral to Ortho.  She is to see her primary care practitioner in the morning and I have encouraged her to discuss issues with regards to physical therapy home health etc. with her.  Gailen Shelter, MD [facility]   *This note was dictated using voice recognition software/Dragon.  Despite best efforts to proofread, errors can occur which can change the meaning.  Any change was purely unintentional.

## 2020-09-22 NOTE — Progress Notes (Signed)
Established patient visit   Patient: Suzanne Avila   DOB: 10/09/1946   74 y.o. Female  MRN: [id number] Visit Date: 09/23/2020  Today's healthcare provider: Margaretann Loveless, PA-C   Chief Complaint  Patient presents with  . Hospitalization Follow-up   Subjective    HPI patient here with her son Suzanne Avila.  Follow up Hospitalization  Patient was admitted to [facility] on 09/14/20 and discharged on 09/17/2020 She was treated for Right upper lobe Cavitary Mass/susected abscess, and Acute Atrial Fibrillation  Treatment for this included Meropenem,vancomycin and zithromax, IV unasyn and PO Augmentin. She received IV Diltiazem change to po Cardizem/diltiazem and to follow up outpatient with Cardiology.       Recommend anticoagulation with Eliquis, however patient needs possible bronchoscopy. Ramipril decreased to 2.5 mg qd and cont cardizem with holding parameter. If BP remains low/soft PCP to adjust meds. Patient was in NSR at discharge.  Telephone follow up was not done She reports excellent compliance with treatment. She reports this condition is improved.  She has had her outpatient f/u with Dr. Jayme Cloud, Pulmonology on 09/22/20. Since patient is improving, it is hopeful that the mass noted on the CT lung cancer screening may have been the abscess. She is going to have a repeat Chest CT on 10/06/20 and follow up with Dr. Jayme Cloud after that to see if bronchoscopy is necessary.   Initial reason for hospitalization was a syncopal event, most likely from poor oral intake and dysphagia. When she fell she thinks she must have landed on her left hip. She has had intense pain in the left hip, plus with lack of movement in the hospital exacerbated her stiffness and discomfort with movement. She did have an xray yesterday, ordered by Dr. Jayme Cloud. Xray was reviewed today in office with patient and son. No fractures noted, but does have mild AVN bilaterally. I did discuss this is unfortunately  most likely from steroids. She gets ESI in her back and also has steroid inhalers and nebulizers for her COPD. She already has HHPT coming.  -----------------------------------------------------------------------------------------   Patient Active Problem List   Diagnosis Date Noted  . Cavitary lesion of lung 09/15/2020  . AF (paroxysmal atrial fibrillation) (HCC) 09/15/2020  . Failure to thrive in adult 09/15/2020  . Severe protein-calorie malnutrition [name]: less than 60% of standard weight) (HCC) 09/15/2020  . Dysphagia 09/15/2020  . Hypotension 09/15/2020  . Abscess of right lung with pneumonia (HCC) 09/14/2020  . Chronic low back pain (Left) w/o sciatica 09/10/2020  . Pharmacologic therapy 05/05/2020  . Chronic diarrhea 05/04/2020  . DDD (degenerative disc disease), lumbosacral 11/19/2019  . Therapeutic opioid-induced constipation (OIC) 08/01/2019  . Chronic sacroiliac joint pain (Left) 05/01/2019  . Chronic groin pain (Left) 05/01/2019  . Spondylosis without myelopathy or radiculopathy, lumbosacral region 05/01/2019  . Other specified dorsopathies, sacral and sacrococcygeal region 05/01/2019  . Renal artery stenosis (HCC) 12/27/2017  . Pain in limb 12/22/2017  . Tobacco use disorder 12/22/2017  . Renal atrophy, right 12/22/2017  . Thoracic radiculitis 01/31/2017  . Chronic lower extremity pain (Primary Area of Pain) (Bilateral) (L>R) 04/25/2016  . Chronic upper extremity pain (Third area of Pain) (Bilateral) (L>R) 04/25/2016  . Disturbance of skin sensation 04/20/2016  . Neurogenic pain 04/20/2016  . Chronic hip pain (Bilateral) (L>R) 11/11/2015  . Vitamin D deficiency 11/11/2015  . Edema 09/02/2015  . 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 Encounter for therapeutic drug level monitoring 08/14/2015  . Long term current  use of opiate analgesic 08/14/2015  . Long term prescription opiate use 08/14/2015  . Uncomplicated opioid dependence (HCC) 08/14/2015  .  Opiate use (30 MME/Day) 08/14/2015  . Chronic low back pain (2ry area of Pain) (Bilateral) (L>R) w/ sciatica (Bilateral) 08/14/2015  . Chronic pain syndrome 08/14/2015  . Alcoholic (HCC) 08/14/2015  . B12 deficiency 08/14/2015  . Narrowing of intervertebral disc space 08/14/2015  . Encounter for screening for diabetes mellitus 08/14/2015  . History of metabolic disorder 08/14/2015  . Fungal infection of toenail 08/14/2015  . Osteopenia 08/14/2015  . Compulsive tobacco user syndrome 08/14/2015  . Osteoarthritis of hip (Left) 08/14/2015  . Chronic hip pain (Left) 08/14/2015  . Chronic lumbar radicular pain (L5 dermatome) (Bilateral) (L>R) 08/14/2015  . Lumbar facet syndrome (Bilateral) (L>R) 08/14/2015  . Lumbar spondylosis 08/14/2015  . 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 Lumbar Postlaminectomy syndrome (L4-5) 08/14/2015  . Fibromyalgia 08/14/2015  . Chronic airway obstruction (HCC) 04/21/2015  . Allergic rhinitis 02/10/2010  . Atrial paroxysmal tachycardia (HCC) 07/06/2009  . CAFL (chronic airflow limitation) (HCC) 07/06/2009  . Acid reflux 07/06/2009  . Glaucoma 07/06/2009  . Essential hypertension 07/06/2009  . Hypercholesterolemia without hypertriglyceridemia 07/06/2009  . Absence of bladder continence 07/06/2009   Past Medical History:  Diagnosis Date  . Closed left arm fracture   . COPD (chronic obstructive pulmonary disease) (HCC)   . Glaucoma   . Hyperlipidemia   . Hypertension   . Lung mass        Medications: Outpatient Medications Prior to Visit  Medication Sig  . albuterol (PROVENTIL) (2.5 MG/3ML) 0.083% nebulizer solution Take 3 mLs (2.5 mg total) by nebulization every 6 (six) hours as needed for wheezing or shortness of breath.  Marland Kitchen amoxicillin-clavulanate (AUGMENTIN) 875-125 MG tablet Take 1 tablet by mouth every 12 (twelve) hours for 7 days.  Marland Kitchen aspirin 325 MG tablet Take 1 tablet (325 mg total) by mouth daily.  Marland Kitchen atorvastatin  (LIPITOR) 40 MG tablet Take 1 tablet (40 mg total) by mouth daily.  . Budeson-Glycopyrrol-Formoterol (BREZTRI AEROSPHERE) 160-9-4.8 MCG/ACT AERO Inhale 2 puffs into the lungs in the morning and at bedtime.  . Budeson-Glycopyrrol-Formoterol (BREZTRI AEROSPHERE) 160-9-4.8 MCG/ACT AERO Inhale 2 puffs into the lungs in the morning and at bedtime for 1 day.  . diltiazem (CARDIZEM) 120 MG tablet Take 1 tablet (120 mg total) by mouth daily.  . dorzolamide (TRUSOPT) 2 % ophthalmic solution Place 1 drop into both eyes 3 (three) times daily. (Patient taking differently: Place 1 drop into both eyes 2 (two) times daily. )  . feeding supplement (ENSURE ENLIVE / ENSURE PLUS) LIQD Take 237 mLs by mouth 2 (two) times daily between meals.  . fluconazole (DIFLUCAN) 100 MG tablet Take 1 tablet (100 mg total) by mouth daily.  . furosemide (LASIX) 20 MG tablet Take 1 tablet (20 mg total) by mouth daily.  Marland Kitchen HYDROcodone-acetaminophen (NORCO) 7.5-325 MG tablet Take 1 tablet by mouth every 6 (six) hours as needed for severe pain. Must last 30 days  . Lactobacillus-Inulin (CULTURELLE ULTIMATE STRENGTH) CAPS Take 1 capsule by mouth daily.  Marland Kitchen latanoprost (XALATAN) 0.005 % ophthalmic solution Place 1 drop into both eyes at bedtime.  . lipase/protease/amylase 24000-76000 units CPEP Take 1 capsule (24,000 Units total) by mouth 3 (three) times daily before meals.  . magnesium gluconate (MAGONATE) 500 MG tablet Take 500 mg by mouth daily.  . Multiple Vitamin (MULTI-VITAMIN) tablet Take 1 tablet by mouth daily.   Marland Kitchen omeprazole (  PRILOSEC) 40 MG capsule Take 1 capsule (40 mg total) by mouth daily.  . pregabalin (LYRICA) 150 MG capsule Take 1 capsule (150 mg total) by mouth every 8 (eight) hours.  . ramipril (ALTACE) 2.5 MG capsule Take 1 capsule (2.5 mg total) by mouth daily.  Marland Kitchen rOPINIRole (REQUIP) 2 MG tablet Take 1 tablet (2 mg total) by mouth 2 (two) times daily.  Marland Kitchen spironolactone (ALDACTONE) 25 MG tablet Take 1 tablet (25 mg  total) by mouth daily.  . vitamin B-12 (CYANOCOBALAMIN) 500 MCG tablet Take 1 tablet (500 mcg total) by mouth daily.   No facility-administered medications prior to visit.    Review of Systems  Constitutional: Positive for fatigue.  HENT: Negative.   Respiratory: Negative.   Cardiovascular: Negative.   Musculoskeletal: Positive for arthralgias and gait problem.  Neurological: Positive for weakness and numbness.    Last CBC Lab Results  Component Value Date   WBC 12.5 (H) 09/15/2020   HGB 9.9 (L) 09/15/2020   HCT 27.5 (L) 09/15/2020   MCV 90.2 09/15/2020   MCH 32.5 09/15/2020   RDW 13.1 09/15/2020   PLT 321 09/15/2020   Last metabolic panel Lab Results  Component Value Date   GLUCOSE 74 09/17/2020   NA 133 (L) 09/17/2020   K 3.8 09/17/2020   CL 106 09/17/2020   CO2 20 (L) 09/17/2020   BUN 9 09/17/2020   CREATININE 0.56 09/17/2020   GFRNONAA >60 09/17/2020   GFRAA 61 02/03/2020   CALCIUM 8.1 (L) 09/17/2020   PHOS 2.5 09/17/2020   PROT 4.9 (L) 09/16/2020   ALBUMIN 2.1 (L) 09/16/2020   LABGLOB 2.3 02/03/2020   AGRATIO 1.8 02/03/2020   BILITOT 0.6 09/16/2020   ALKPHOS 54 09/16/2020   AST 18 09/16/2020   ALT 13 09/16/2020   ANIONGAP 7 09/17/2020      Objective    BP (!) 142/83 (BP Location: Right Arm, Patient Position: Sitting, Cuff Size: Normal)   Pulse 90   Temp 98.7 F (37.1 C) (Oral)   Resp 16   Wt 87 lb 1.6 oz (39.5 kg)   BMI 17.59 kg/m  BP Readings from Last 3 Encounters:  09/23/20 (!) 142/83  09/22/20 130/78  09/17/20 (!) 149/80   Wt Readings from Last 3 Encounters:  09/23/20 87 lb 1.6 oz (39.5 kg)  09/22/20 88 lb 6.4 oz (40.1 kg)  09/14/20 87 lb (39.5 kg)      Physical Exam Vitals reviewed.  Constitutional:      General: She is not in acute distress.    Appearance: Normal appearance. She is well-developed, well-groomed and underweight. She is not diaphoretic.  HENT:     Head: Normocephalic and atraumatic.  Cardiovascular:     Rate and  Rhythm: Normal rate and regular rhythm.     Pulses: Normal pulses.     Heart sounds: Normal heart sounds. No murmur heard.  No friction rub. No gallop.   Pulmonary:     Effort: Pulmonary effort is normal. No respiratory distress.     Breath sounds: Normal breath sounds. No wheezing or rales.  Musculoskeletal:     Cervical back: Normal range of motion and neck supple.     Right lower leg: Edema (1-2+ pitting; patient reports improved since restarting furosemide) present.     Left lower leg: Edema (2+ pitting; improved since restarting furosemide) present.     Comments: Patient has not been walking much or weight bearing due to pain in left hip  Neurological:  Mental Status: She is alert.  Psychiatric:        Behavior: Behavior is cooperative.     CLINICAL DATA:  LEFT hip pain, fall 1 week ago.  EXAM: DG HIP (WITH OR WITHOUT PELVIS) 2-3V LEFT  COMPARISON:  PET exam of November ninth of 2021  FINDINGS: Signs of lumbar spinal fusion and marked degenerative changes of the lumbar spine. Osteopenia. No fracture of the bony pelvis.  Signs of bilateral femoral AVN of the femoral heads. LEFT hip is located without visible fracture.  IMPRESSION: Signs of bilateral femoral AVN without signs of pelvic or LEFT hip fracture.  Osteopenia.   Electronically Signed   By: Donzetta KohutGeoffrey  Wile M.D.   On: 09/22/2020 16:53  No results found for any visits on 09/23/20.  Assessment & Plan     1. Cavitary lesion of lung Patient has had much symptom improvement with treatment. Has repeat Chest CT ordered on 10/06/20 and is followed by Pulmonology, Dr. Jayme CloudGonzalez.  2. AVN (avascular necrosis of bone) (HCC) Noted on hip xray. Referral to orthopedics placed for consideration of treatment options/monitoring, if needed. Consult appreciated.  - Ambulatory referral to Orthopedic Surgery  3. FTT (failure to thrive) in adult Patient losing weight, has decreased appetite, and difficulty  sleeping. Will start low dose Mirtazapine as below. F/U in 4 weeks.  - mirtazapine (REMERON) 7.5 MG tablet; Take 1 tablet (7.5 mg total) by mouth at bedtime.  Dispense: 30 tablet; Refill: 1  4. AF (paroxysmal atrial fibrillation) (HCC) Currently still in NSR. Continue diltiazem 120mg  daily and ramipril 2.5mg  daily.   5. Difficulty sleeping See above treatment plan for # 3.    No follow-ups on file.      Delmer IslamI, Benz Vandenberghe M Jearlean Demauro, PA-C, have reviewed all documentation for this visit. The documentation on 09/24/20 for the exam, diagnosis, procedures, and orders are all accurate and complete.   Reine JustJennifer M Ehtan Delfavero, PA-C  [facility] [phone] (phone) [phone] (fax)  [facility]

## 2020-09-22 NOTE — Telephone Encounter (Signed)
I approved her a 30 day courtesy refill on her Lasix 20 mg with the [employer].  Never mind the venous insuffiencey listing.   Not sure how that got in the documentation.

## 2020-09-22 NOTE — Patient Instructions (Signed)
We are going to get x-rays of your left hip and pelvis today   Continue taking Breztri 2 puffs twice a day  Continue your antibiotics and your antifungal  Let us know when you run out of the Augmentin you may need extra medication  We are going to repeat a chest CT in 3 weeks time we will see you in follow-up after that, we will call you withresults  Follow-up here in 4 to 6 weeks time

## 2020-09-23 ENCOUNTER — Ambulatory Visit (INDEPENDENT_AMBULATORY_CARE_PROVIDER_SITE_OTHER): Payer: Medicare Other | Admitting: Physician Assistant

## 2020-09-23 ENCOUNTER — Other Ambulatory Visit: Payer: Self-pay

## 2020-09-23 ENCOUNTER — Encounter: Payer: Self-pay | Admitting: Physician Assistant

## 2020-09-23 VITALS — BP 142/83 | HR 90 | Temp 98.7°F | Resp 16 | Wt 87.1 lb

## 2020-09-23 DIAGNOSIS — G479 Sleep disorder, unspecified: Secondary | ICD-10-CM

## 2020-09-23 DIAGNOSIS — J984 Other disorders of lung: Secondary | ICD-10-CM

## 2020-09-23 DIAGNOSIS — M8700 Idiopathic aseptic necrosis of unspecified bone: Secondary | ICD-10-CM | POA: Diagnosis not present

## 2020-09-23 DIAGNOSIS — R627 Adult failure to thrive: Secondary | ICD-10-CM

## 2020-09-23 DIAGNOSIS — I480 Paroxysmal atrial fibrillation: Secondary | ICD-10-CM | POA: Diagnosis not present

## 2020-09-23 MED ORDER — MIRTAZAPINE 7.5 MG PO TABS: 7.5000 mg | ORAL_TABLET | Freq: Every day | ORAL | 1 refills | Status: DC

## 2020-09-23 NOTE — Patient Instructions (Signed)
Mirtazapine tablets What is this medicine? MIRTAZAPINE (mir TAZ a peen) is used to treat depression. This medicine may be used for other purposes; ask your health care provider or pharmacist if you have questions. COMMON BRAND NAME(S): Remeron What should I tell my health care provider before I take this medicine? They need to know if you have any of these conditions:  bipolar disorder  glaucoma  kidney disease  liver disease  suicidal thoughts  an unusual or allergic reaction to mirtazapine, other medicines, foods, dyes, or preservatives  pregnant or trying to get pregnant  breast-feeding How should I use this medicine? Take this medicine by mouth with a glass of water. Follow the directions on the prescription label. Take your medicine at regular intervals. Do not take your medicine more often than directed. Do not stop taking this medicine suddenly except upon the advice of your doctor. Stopping this medicine too quickly may cause serious side effects or your condition may worsen. A special MedGuide will be given to you by the pharmacist with each prescription and refill. Be sure to read this information carefully each time. Talk to your pediatrician regarding the use of this medicine in children. Special care may be needed. Overdosage: If you think you have taken too much of this medicine contact a poison control center or emergency room at once. NOTE: This medicine is only for you. Do not share this medicine with others. What if I miss a dose? If you miss a dose, take it as soon as you can. If it is almost time for your next dose, take only that dose. Do not take double or extra doses. What may interact with this medicine? Do not take this medicine with any of the following medications:  linezolid  MAOIs like Carbex, Eldepryl, Marplan, Nardil, and Parnate  methylene blue (injected into a vein) This medicine may also interact with the following  medications:  alcohol  antiviral medicines for HIV or AIDS  certain medicines that treat or prevent blood clots like warfarin  certain medicines for depression, anxiety, or psychotic disturbances  certain medicines for fungal infections like ketoconazole and itraconazole  certain medicines for migraine headache like almotriptan, eletriptan, frovatriptan, naratriptan, rizatriptan, sumatriptan, zolmitriptan  certain medicines for seizures like carbamazepine or phenytoin  certain medicines for sleep  cimetidine  erythromycin  fentanyl  lithium  medicines for blood pressure  nefazodone  rasagiline  rifampin  supplements like St. John's wort, kava kava, valerian  tramadol  tryptophan This list may not describe all possible interactions. Give your health care provider a list of all the medicines, herbs, non-prescription drugs, or dietary supplements you use. Also tell them if you smoke, drink alcohol, or use illegal drugs. Some items may interact with your medicine. What should I watch for while using this medicine? Tell your doctor if your symptoms do not get better or if they get worse. Visit your doctor or health care professional for regular checks on your progress. Because it may take several weeks to see the full effects of this medicine, it is important to continue your treatment as prescribed by your doctor. Patients and their families should watch out for new or worsening thoughts of suicide or depression. Also watch out for sudden changes in feelings such as feeling anxious, agitated, panicky, irritable, hostile, aggressive, impulsive, severely restless, overly excited and hyperactive, or not being able to sleep. If this happens, especially at the beginning of treatment or after a change in dose, call your health   care professional. Bonita Quin may get drowsy or dizzy. Do not drive, use machinery, or do anything that needs mental alertness until you know how this medicine  affects you. Do not stand or sit up quickly, especially if you are an older patient. This reduces the risk of dizzy or fainting spells. Alcohol may interfere with the effect of this medicine. Avoid alcoholic drinks. This medicine may cause dry eyes and blurred vision. If you wear contact lenses you may feel some discomfort. Lubricating drops may help. See your eye doctor if the problem does not go away or is severe. Your mouth may get dry. Chewing sugarless gum or sucking hard candy, and drinking plenty of water may help. Contact your doctor if the problem does not go away or is severe. What side effects may I notice from receiving this medicine? Side effects that you should report to your doctor or health care professional as soon as possible:  allergic reactions like skin rash, itching or hives, swelling of the face, lips, or tongue  anxious  changes in vision  chest pain  confusion  elevated mood, decreased need for sleep, racing thoughts, impulsive behavior  eye pain  fast, irregular heartbeat  feeling faint or lightheaded, falls  feeling agitated, angry, or irritable  fever or chills, sore throat  hallucination, loss of contact with reality  loss of balance or coordination  mouth sores  redness, blistering, peeling or loosening of the skin, including inside the mouth  restlessness, pacing, inability to keep still  seizures  stiff muscles  suicidal thoughts or other mood changes  trouble passing urine or change in the amount of urine  trouble sleeping  unusual bleeding or bruising  unusually weak or tired  vomiting Side effects that usually do not require medical attention (report to your doctor or health care professional if they continue or are bothersome):  change in appetite  constipation  dizziness  dry mouth  muscle aches or pains  nausea  tired  weight gain This list may not describe all possible side effects. Call your doctor for  medical advice about side effects. You may report side effects to FDA at 1-800-FDA-1088. Where should I keep my medicine? Keep out of the reach of children. Store at room temperature between 15 and 30 degrees C (59 and 86 degrees F) Protect from light and moisture. Throw away any unused medicine after the expiration date. NOTE: This sheet is a summary. It may not cover all possible information. If you have questions about this medicine, talk to your doctor, pharmacist, or health care provider.  2020 Elsevier/Gold Standard (2016-03-10 17:30:45)   Avascular Necrosis Avascular necrosis is death of bone tissue due to lack of blood supply. This condition may also be called:  Osteonecrosis.  Aseptic necrosis.  Ischemic bone necrosis. Without proper blood supply, the internal layer of the affected bone dies. Over time, small breaks form in the outer layer of the bone. If this process affects a bone near a joint, the joint may collapse or move out of place (become dislocated). Joints that may be affected include the jaw, wrist, fingers, knee, and foot. Avascular necrosis most commonly affects:  The hip joint, especially the top of the thigh bone (femoral head).  The top of the upper arm bone (humeral head). What are the causes? This condition may be caused by:  Damage or injury to a bone or joint.  Using steroid medicine such as prednisone for a long time.  Changes in the body's  disease-fighting system (immune system).  Changes in the body's system of chemicals that regulate body processes (hormones).  A lot of exposure to radiation. What increases the risk? The following factors may make you more likely to develop this condition:  Alcohol abuse.  A history of joint injury.  Long-term or frequent use of steroid medicines.  Having a medical condition such as: ? HIV or AIDS. ? Diabetes. ? Sickle cell disease. ? A disease in which your body's immune system attacks your body's own  healthy tissues (autoimmune disease). What are the signs or symptoms? The main symptoms of avascular necrosis are:  Pain. If an affected joint collapses, the pain may suddenly get severe.  Decreased ability to move the affected bone or joint. How is this diagnosed? Avascular necrosis may be diagnosed based on:  Your symptoms and medical history.  A physical exam.  Imaging tests, such as X-rays, bone scans, and an MRI. How is this treated? Treatment for this condition may include:  Pain medicine, such as NSAIDs.  Medicine to improve bone growth.  Avoiding placing any pressure or weight on the affected area. If avascular necrosis occurs in your hip, ankle, or foot, you may need to use a device to help you move around (assistive device), such as crutches or a rolling scooter.  Physical therapy to help regain strength and motion in the affected area.  Surgery, such as: ? Core decompression. In this surgery, one or more holes are placed in the bone for new blood vessels to grow into. This provides a renewed blood supply to the bone. This surgery may reduce pain and pressure in the affected bone and slow the destruction of bones and joints. ? Osteotomy. In this surgery, the bone is reshaped to reduce stress on the affected area of the joint. ? Bone grafting. In this surgery, healthy bone from a different part of your body is used to replace damaged bone. ? Total joint replacement (arthroplasty). In this surgery, the affected surfaces of bone on one or both sides of a joint are replaced with artificial parts (prostheses).  Electrical stimulation (also called e-stim). During this procedure, a probe over the skin sends shock waves into the body. This may help to encourage new bone growth.  High-pressure oxygen therapy (hyperbaric oxygen). This is rarely used. Follow these instructions at home: Activity  Ask your health care provider what activities are safe for you.  If physical  therapy was prescribed, do exercises as told by your health care provider.  Avoid placing any pressure or weight on the affected area, as told by your health care provider. Use assistive devices as instructed. Managing pain, stiffness, and swelling  If directed, apply heat to the affected area as often as told by your health care provider. Heat can reduce the stiffness of your muscles and joints. Use the heat source that your health care provider recommends, such as a moist heat pack or a heating pad. ? Place a towel between your skin and the heat source. ? Leave the heat on for 20-30 minutes. ? Remove the heat if your skin turns bright red. This is especially important if you are unable to feel pain, heat, or cold. You may have a greater risk of getting burned.  If directed, put ice on affected areas. Icing can help to relieve joint pain and swelling. ? Put ice in a plastic bag. ? Place a towel between your skin and the bag. ? Leave the ice on for 20 minutes,  2-3 times a day. General instructions   Take over-the-counter and prescription medicines only as told by your health care provider.  Do not drive or use heavy machinery while taking prescription pain medicine.  If a bone in your arm or leg is affected, ask your health care provider if it is safe for you to drive.  Do not use any products that contain nicotine or tobacco, such as cigarettes and e-cigarettes. These can delay bone healing. If you need help quitting, ask your health care provider.  Do not drink alcohol.  Keep all follow-up visits as told by your health care provider. This is important. Contact a health care provider if:  You have pain that gets worse or does not get better with medicine.  Your ability to move your joint gets worse. Get help right away if:  Your pain suddenly becomes severe. Summary  Avascular necrosis is death of bone tissue due to a lack of blood supply.  Without proper blood supply, the  internal layer of the affected bone dies. Over time, small breaks form in the outer layer of the bone.  Avoid putting any pressure or weight on the affected area. You may need to use a device to help you move around (assistive device), such as crutches or a rolling scooter. This information is not intended to replace advice given to you by your health care provider. Make sure you discuss any questions you have with your health care provider. Document Revised: 09/22/2017 Document Reviewed: 09/19/2017 Elsevier Patient Education  2020 [employer].

## 2020-09-24 ENCOUNTER — Encounter: Payer: Self-pay | Admitting: Physician Assistant

## 2020-09-25 ENCOUNTER — Telehealth: Payer: Self-pay | Admitting: Pulmonary Disease

## 2020-09-25 NOTE — Telephone Encounter (Signed)
Unsure who called patient. However I do see that she due for an appointment. Offered appointment for 09/30/2020. Patient would like to wait until after her CT. She will call back to schedule appt after CT. Nothing further needed.

## 2020-10-06 ENCOUNTER — Other Ambulatory Visit: Payer: Self-pay

## 2020-10-06 ENCOUNTER — Ambulatory Visit
Admission: RE | Admit: 2020-10-06 | Discharge: 2020-10-06 | Disposition: A | Discharge status: Home / Self Care | Payer: Medicare Other | Source: Ambulatory Visit | Attending: Pulmonary Disease | Admitting: Pulmonary Disease

## 2020-10-06 DIAGNOSIS — J984 Other disorders of lung: Secondary | ICD-10-CM | POA: Diagnosis present

## 2020-10-08 ENCOUNTER — Encounter: Payer: Self-pay | Admitting: *Deleted

## 2020-10-08 NOTE — Progress Notes (Signed)
  Oncology Nurse Navigator Documentation  Navigator Location: [facility] (10/08/20 1000)   )Navigator Encounter Type: Scan Review (10/08/20 1000)                         Barriers/Navigation Needs: No Needs;No Barriers At This Time (10/08/20 1000)   Interventions: None Required (10/08/20 1000)

## 2020-10-20 NOTE — Progress Notes (Signed)
Established patient visit   Patient: Suzanne Avila   DOB: Jan 12, 1946   74 y.o. Female  MRN: [id number] Visit Date: 10/21/2020  Today's healthcare provider: Margaretann Loveless, PA-C   Chief Complaint  Patient presents with  . Follow-up   Subjective    HPI  Follow up for FTT  The patient was last seen for this 4 weeks ago. Changes made at last visit include Will start low dose Mirtazapine  .  She reports excellent compliance with treatment. She feels that condition is Improved. She is not having side effects.   Wt Readings from Last 3 Encounters:  10/21/20 95 lb 11.2 oz (43.4 kg)  09/23/20 87 lb 1.6 oz (39.5 kg)  09/22/20 88 lb 6.4 oz (40.1 kg)   -----------------------------------------------------------------------------------------   Patient Active Problem List   Diagnosis Date Noted  . Cavitary lesion of lung 09/15/2020  . AF (paroxysmal atrial fibrillation) (HCC) 09/15/2020  . Failure to thrive in adult 09/15/2020  . Severe protein-calorie malnutrition [name]: less than 60% of standard weight) (HCC) 09/15/2020  . Dysphagia 09/15/2020  . Hypotension 09/15/2020  . Abscess of right lung with pneumonia (HCC) 09/14/2020  . Chronic low back pain (Left) w/o sciatica 09/10/2020  . Pharmacologic therapy 05/05/2020  . Chronic diarrhea 05/04/2020  . DDD (degenerative disc disease), lumbosacral 11/19/2019  . Therapeutic opioid-induced constipation (OIC) 08/01/2019  . Chronic sacroiliac joint pain (Left) 05/01/2019  . Chronic groin pain (Left) 05/01/2019  . Spondylosis without myelopathy or radiculopathy, lumbosacral region 05/01/2019  . Other specified dorsopathies, sacral and sacrococcygeal region 05/01/2019  . Renal artery stenosis (HCC) 12/27/2017  . Pain in limb 12/22/2017  . Tobacco use disorder 12/22/2017  . Renal atrophy, right 12/22/2017  . Thoracic radiculitis 01/31/2017  . Chronic lower extremity pain (Primary Area of Pain) (Bilateral) (L>R)  04/25/2016  . Chronic upper extremity pain (Third area of Pain) (Bilateral) (L>R) 04/25/2016  . Disturbance of skin sensation 04/20/2016  . Neurogenic pain 04/20/2016  . Chronic hip pain (Bilateral) (L>R) 11/11/2015  . Vitamin D deficiency 11/11/2015  . Edema 09/02/2015  . 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 Encounter for therapeutic drug level monitoring 08/14/2015  . Long term current use of opiate analgesic 08/14/2015  . Long term prescription opiate use 08/14/2015  . Uncomplicated opioid dependence (HCC) 08/14/2015  . Opiate use (30 MME/Day) 08/14/2015  . Chronic low back pain (2ry area of Pain) (Bilateral) (L>R) w/ sciatica (Bilateral) 08/14/2015  . Chronic pain syndrome 08/14/2015  . Alcoholic (HCC) 08/14/2015  . B12 deficiency 08/14/2015  . Narrowing of intervertebral disc space 08/14/2015  . Encounter for screening for diabetes mellitus 08/14/2015  . History of metabolic disorder 08/14/2015  . Fungal infection of toenail 08/14/2015  . Osteopenia 08/14/2015  . Compulsive tobacco user syndrome 08/14/2015  . Osteoarthritis of hip (Left) 08/14/2015  . Chronic hip pain (Left) 08/14/2015  . Chronic lumbar radicular pain (L5 dermatome) (Bilateral) (L>R) 08/14/2015  . Lumbar facet syndrome (Bilateral) (L>R) 08/14/2015  . Lumbar spondylosis 08/14/2015  . 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 Lumbar Postlaminectomy syndrome (L4-5) 08/14/2015  . Fibromyalgia 08/14/2015  . Chronic airway obstruction (HCC) 04/21/2015  . Allergic rhinitis 02/10/2010  . Atrial paroxysmal tachycardia (HCC) 07/06/2009  . CAFL (chronic airflow limitation) (HCC) 07/06/2009  . Acid reflux 07/06/2009  . Glaucoma 07/06/2009  . Essential hypertension 07/06/2009  . Hypercholesterolemia without hypertriglyceridemia 07/06/2009  . Absence of bladder continence 07/06/2009   Past Medical  History:  Diagnosis Date  . Closed left arm fracture   . COPD (chronic obstructive  pulmonary disease) (HCC)   . Glaucoma   . Hyperlipidemia   . Hypertension   . Lung mass        Medications: Outpatient Medications Prior to Visit  Medication Sig  . Budeson-Glycopyrrol-Formoterol (BREZTRI AEROSPHERE) 160-9-4.8 MCG/ACT AERO Inhale 2 puffs into the lungs in the morning and at bedtime.  . furosemide (LASIX) 20 MG tablet Take 1 tablet (20 mg total) by mouth daily.  . magnesium gluconate (MAGONATE) 500 MG tablet Take 500 mg by mouth daily.  . mirtazapine (REMERON) 7.5 MG tablet Take 1 tablet (7.5 mg total) by mouth at bedtime.  . Multiple Vitamin (MULTI-VITAMIN) tablet Take 1 tablet by mouth daily.   Marland Kitchen omeprazole (PRILOSEC) 40 MG capsule Take 1 capsule (40 mg total) by mouth daily.  . pregabalin (LYRICA) 150 MG capsule Take 1 capsule (150 mg total) by mouth every 8 (eight) hours.  . ramipril (ALTACE) 2.5 MG capsule Take 1 capsule (2.5 mg total) by mouth daily.  Marland Kitchen rOPINIRole (REQUIP) 2 MG tablet Take 1 tablet (2 mg total) by mouth 2 (two) times daily.  Marland Kitchen spironolactone (ALDACTONE) 25 MG tablet Take 1 tablet (25 mg total) by mouth daily. (Patient taking differently: Take 25 mg by mouth daily.)  . vitamin B-12 (CYANOCOBALAMIN) 500 MCG tablet Take 1 tablet (500 mcg total) by mouth daily. (Patient taking differently: Take 500 mcg by mouth daily.)  . albuterol (PROVENTIL) (2.5 MG/3ML) 0.083% nebulizer solution Take 3 mLs (2.5 mg total) by nebulization every 6 (six) hours as needed for wheezing or shortness of breath.  Marland Kitchen aspirin 325 MG tablet Take 1 tablet (325 mg total) by mouth daily.  Marland Kitchen atorvastatin (LIPITOR) 40 MG tablet Take 1 tablet (40 mg total) by mouth daily.  Marland Kitchen diltiazem (CARDIZEM) 120 MG tablet Take 1 tablet (120 mg total) by mouth daily. (Patient taking differently: Take 120 mg by mouth daily.)  . dorzolamide (TRUSOPT) 2 % ophthalmic solution Place 1 drop into both eyes 3 (three) times daily. (Patient taking differently: Place 1 drop into both eyes 2 (two) times daily. )   . feeding supplement (ENSURE ENLIVE / ENSURE PLUS) LIQD Take 237 mLs by mouth 2 (two) times daily between meals.  . fluconazole (DIFLUCAN) 100 MG tablet Take 1 tablet (100 mg total) by mouth daily.  Marland Kitchen HYDROcodone-acetaminophen (NORCO) 7.5-325 MG tablet Take 1 tablet by mouth every 6 (six) hours as needed for severe pain. Must last 30 days  . Lactobacillus-Inulin (CULTURELLE ULTIMATE STRENGTH) CAPS Take 1 capsule by mouth daily.  Marland Kitchen latanoprost (XALATAN) 0.005 % ophthalmic solution Place 1 drop into both eyes at bedtime.  . lipase/protease/amylase 24000-76000 units CPEP Take 1 capsule (24,000 Units total) by mouth 3 (three) times daily before meals.   No facility-administered medications prior to visit.    Review of Systems  Constitutional: Negative.   Respiratory: Negative.   Cardiovascular: Negative.   Neurological: Negative.   Psychiatric/Behavioral: Negative.     Last CBC Lab Results  Component Value Date   WBC 12.5 (H) 09/15/2020   HGB 9.9 (L) 09/15/2020   HCT 27.5 (L) 09/15/2020   MCV 90.2 09/15/2020   MCH 32.5 09/15/2020   RDW 13.1 09/15/2020   PLT 321 09/15/2020   Last metabolic panel Lab Results  Component Value Date   GLUCOSE 74 09/17/2020   NA 133 (L) 09/17/2020   K 3.8 09/17/2020   CL 106 09/17/2020  CO2 20 (L) 09/17/2020   BUN 9 09/17/2020   CREATININE 0.56 09/17/2020   GFRNONAA >60 09/17/2020   GFRAA 61 02/03/2020   CALCIUM 8.1 (L) 09/17/2020   PHOS 2.5 09/17/2020   PROT 4.9 (L) 09/16/2020   ALBUMIN 2.1 (L) 09/16/2020   LABGLOB 2.3 02/03/2020   AGRATIO 1.8 02/03/2020   BILITOT 0.6 09/16/2020   ALKPHOS 54 09/16/2020   AST 18 09/16/2020   ALT 13 09/16/2020   ANIONGAP 7 09/17/2020      Objective    BP 114/67 (BP Location: Left Arm, Patient Position: Sitting, Cuff Size: Normal)   Pulse 81   Temp 98.5 F (36.9 C) (Oral)   Resp 16   Wt 95 lb 11.2 oz (43.4 kg)   BMI 19.33 kg/m  BP Readings from Last 3 Encounters:  10/21/20 114/67  09/23/20 (!)  142/83  09/22/20 130/78   Wt Readings from Last 3 Encounters:  10/21/20 95 lb 11.2 oz (43.4 kg)  09/23/20 87 lb 1.6 oz (39.5 kg)  09/22/20 88 lb 6.4 oz (40.1 kg)      Physical Exam Vitals reviewed.  Constitutional:      General: She is not in acute distress.    Appearance: Normal appearance. She is well-developed, normal weight and well-nourished. She is not ill-appearing or diaphoretic.  Cardiovascular:     Rate and Rhythm: Normal rate and regular rhythm.     Heart sounds: Normal heart sounds. No murmur heard. No friction rub. No gallop.   Pulmonary:     Effort: Pulmonary effort is normal. No respiratory distress.     Breath sounds: Normal breath sounds. No wheezing or rales.  Musculoskeletal:     Cervical back: Normal range of motion and neck supple.     Right lower leg: No edema.     Left lower leg: No edema.  Skin:    General: Skin is warm and dry.  Neurological:     General: No focal deficit present.     Mental Status: She is alert. Mental status is at baseline.  Psychiatric:        Mood and Affect: Mood normal.        Thought Content: Thought content normal.      No results found for any visits on 10/21/20.  Assessment & Plan     1. FTT (failure to thrive) in adult Improving. Continue mirtazapine.  - mirtazapine (REMERON) 7.5 MG tablet; Take 1 tablet (7.5 mg total) by mouth at bedtime.  Dispense: 30 tablet; Refill: 1  2. Venous insufficiency Noted to have swelling in her legs. Medication refilled as below. May take a second tab of furosemide if legs are more swollen. Elevate legs at rest.  - furosemide (LASIX) 20 MG tablet; Take 1 tablet (20 mg total) by mouth daily.  Dispense: 30 tablet; Refill: 0  3. CAFL (chronic airflow limitation) (HCC) Stable. Diagnosis pulled for medication refill. Continue current medical treatment plan. - albuterol (PROVENTIL) (2.5 MG/3ML) 0.083% nebulizer solution; Take 3 mLs (2.5 mg total) by nebulization every 6 (six) hours as  needed for wheezing or shortness of breath.  Dispense: 150 mL; Refill: 1  4. Renal artery stenosis (HCC) Stable. Diagnosis pulled for medication refill. Continue current medical treatment plan. - atorvastatin (LIPITOR) 40 MG tablet; Take 1 tablet (40 mg total) by mouth daily.  Dispense: 90 tablet; Refill: 1  5. Pure hypercholesterolemia Stable. Diagnosis pulled for medication refill. Continue current medical treatment plan. - atorvastatin (LIPITOR) 40 MG tablet; Take 1  tablet (40 mg total) by mouth daily.  Dispense: 90 tablet; Refill: 1  6. Essential hypertension Stable. Diagnosis pulled for medication refill. Continue current medical treatment plan. - diltiazem (CARDIZEM) 120 MG tablet; Take 1 tablet (120 mg total) by mouth daily.  Dispense: 90 tablet; Refill: 0 - spironolactone (ALDACTONE) 25 MG tablet; Take 1 tablet (25 mg total) by mouth daily.  Dispense: 90 tablet; Refill: 1  7. Glaucoma of both eyes, unspecified glaucoma type Stable. Diagnosis pulled for medication refill. Continue current medical treatment plan. - dorzolamide (TRUSOPT) 2 % ophthalmic solution; Place 1 drop into both eyes 2 (two) times daily.  Dispense: 10 mL; Refill: 5 - latanoprost (XALATAN) 0.005 % ophthalmic solution; Place 1 drop into both eyes at bedtime.  Dispense: 2.5 mL; Refill: 3  8. Gastroesophageal reflux disease, unspecified whether esophagitis present Stable. Diagnosis pulled for medication refill. Continue current medical treatment plan. - omeprazole (PRILOSEC) 40 MG capsule; Take 1 capsule (40 mg total) by mouth daily.  Dispense: 90 capsule; Refill: 3  9. RLS (restless legs syndrome) Stable. Diagnosis pulled for medication refill. Continue current medical treatment plan. - rOPINIRole (REQUIP) 2 MG tablet; Take 1 tablet (2 mg total) by mouth 2 (two) times daily.  Dispense: 180 tablet; Refill: 2  10. B12 deficiency Stable. Diagnosis pulled for medication refill. Continue current medical treatment  plan. - vitamin B-12 (CYANOCOBALAMIN) 500 MCG tablet; Take 1 tablet (500 mcg total) by mouth daily.  Dispense: 90 tablet; Refill: 3  11. Fibromyalgia Stable. Diagnosis pulled for medication refill. Continue current medical treatment plan. - pregabalin (LYRICA) 150 MG capsule; Take 1 capsule (150 mg total) by mouth every 8 (eight) hours.  Dispense: 270 capsule; Refill: 1   No follow-ups on file.      Delmer IslamI, Shaneya Taketa M Patric Vanpelt, PA-C, have reviewed all documentation for this visit. The documentation on 10/26/20 for the exam, diagnosis, procedures, and orders are all accurate and complete.    Reine JustJennifer M Rashan Patient, PA-C  [facility] [phone] (phone) [phone] (fax)  [facility]

## 2020-10-21 ENCOUNTER — Encounter: Payer: Self-pay | Admitting: Physician Assistant

## 2020-10-21 ENCOUNTER — Ambulatory Visit (INDEPENDENT_AMBULATORY_CARE_PROVIDER_SITE_OTHER): Payer: Medicare Other | Admitting: Physician Assistant

## 2020-10-21 ENCOUNTER — Other Ambulatory Visit: Payer: Self-pay

## 2020-10-21 DIAGNOSIS — G2581 Restless legs syndrome: Secondary | ICD-10-CM

## 2020-10-21 DIAGNOSIS — E538 Deficiency of other specified B group vitamins: Secondary | ICD-10-CM

## 2020-10-21 DIAGNOSIS — H409 Unspecified glaucoma: Secondary | ICD-10-CM

## 2020-10-21 DIAGNOSIS — M797 Fibromyalgia: Secondary | ICD-10-CM

## 2020-10-21 DIAGNOSIS — I701 Atherosclerosis of renal artery: Secondary | ICD-10-CM

## 2020-10-21 DIAGNOSIS — I872 Venous insufficiency (chronic) (peripheral): Secondary | ICD-10-CM | POA: Diagnosis not present

## 2020-10-21 DIAGNOSIS — J449 Chronic obstructive pulmonary disease, unspecified: Secondary | ICD-10-CM

## 2020-10-21 DIAGNOSIS — K219 Gastro-esophageal reflux disease without esophagitis: Secondary | ICD-10-CM

## 2020-10-21 DIAGNOSIS — R627 Adult failure to thrive: Secondary | ICD-10-CM

## 2020-10-21 DIAGNOSIS — I10 Essential (primary) hypertension: Secondary | ICD-10-CM

## 2020-10-21 DIAGNOSIS — E7800 Pure hypercholesterolemia, unspecified: Secondary | ICD-10-CM

## 2020-10-21 MED ORDER — DORZOLAMIDE HCL 2 % OP SOLN: 1.0000 [drp] | Freq: Two times a day (BID) | OPHTHALMIC | 5 refills | Status: DC

## 2020-10-21 MED ORDER — FUROSEMIDE 20 MG PO TABS: 20.0000 mg | ORAL_TABLET | Freq: Every day | ORAL | 0 refills | Status: DC

## 2020-10-21 MED ORDER — VITAMIN B-12 500 MCG PO TABS: 500.0000 ug | ORAL_TABLET | Freq: Every day | ORAL | 3 refills | Status: AC

## 2020-10-21 MED ORDER — MIRTAZAPINE 7.5 MG PO TABS: 7.5000 mg | ORAL_TABLET | Freq: Every day | ORAL | 1 refills | Status: DC

## 2020-10-21 MED ORDER — DILTIAZEM HCL 120 MG PO TABS: 120.0000 mg | ORAL_TABLET | Freq: Every day | ORAL | 0 refills | Status: DC

## 2020-10-21 MED ORDER — ALBUTEROL SULFATE (2.5 MG/3ML) 0.083% IN NEBU: 2.5000 mg | INHALATION_SOLUTION | Freq: Four times a day (QID) | RESPIRATORY_TRACT | 1 refills | Status: AC | PRN

## 2020-10-21 MED ORDER — PREGABALIN 150 MG PO CAPS: 150.0000 mg | ORAL_CAPSULE | Freq: Three times a day (TID) | ORAL | 1 refills | Status: DC

## 2020-10-21 MED ORDER — OMEPRAZOLE 40 MG PO CPDR: 40.0000 mg | DELAYED_RELEASE_CAPSULE | Freq: Every day | ORAL | 3 refills | Status: DC

## 2020-10-21 MED ORDER — ROPINIROLE HCL 2 MG PO TABS: 2.0000 mg | ORAL_TABLET | Freq: Two times a day (BID) | ORAL | 2 refills | Status: DC

## 2020-10-21 MED ORDER — SPIRONOLACTONE 25 MG PO TABS: 25.0000 mg | ORAL_TABLET | Freq: Every day | ORAL | 1 refills | Status: DC

## 2020-10-21 MED ORDER — LATANOPROST 0.005 % OP SOLN: 1.0000 [drp] | Freq: Every day | OPHTHALMIC | 3 refills | Status: DC

## 2020-10-21 MED ORDER — ATORVASTATIN CALCIUM 40 MG PO TABS
40.0000 mg | ORAL_TABLET | Freq: Every day | ORAL | 1 refills | Status: DC
Start: 2020-10-21 — End: 2021-03-31

## 2020-10-21 MED ORDER — RAMIPRIL 2.5 MG PO CAPS: 2.5000 mg | ORAL_CAPSULE | Freq: Every day | ORAL | 3 refills | Status: AC

## 2020-10-26 ENCOUNTER — Encounter: Payer: Self-pay | Admitting: Physician Assistant

## 2020-10-27 ENCOUNTER — Ambulatory Visit: Payer: Self-pay

## 2020-10-27 ENCOUNTER — Telehealth: Payer: Self-pay | Admitting: Physician Assistant

## 2020-10-27 NOTE — Telephone Encounter (Addendum)
Pt. Reports she saw her PCP last week and she was told to take 2 of her Lasix for her swelling in her legs. States she has done this and it has not helped. Denies any shortness of breath or chest pain.Legs are painful. Please advise pt.  Answer Assessment - Initial Assessment Questions 1. ONSET: "When did the swelling start?" (e.g., minutes, hours, days)     A long time 2. LOCATION: "What part of the leg is swollen?"  "Are both legs swollen or just one leg?"     Both legs 3. SEVERITY: "How bad is the swelling?" (e.g., localized; mild, moderate, severe)  - Localized - small area of swelling localized to one leg  - MILD pedal edema - swelling limited to foot and ankle, pitting edema < 1/4 inch (6 mm) deep, rest and elevation eliminate most or all swelling  - MODERATE edema - swelling of lower leg to knee, pitting edema > 1/4 inch (6 mm) deep, rest and elevation only partially reduce swelling  - SEVERE edema - swelling extends above knee, facial or hand swelling present      Moderate 4. REDNESS: "Does the swelling look red or infected?"     Mild 5. PAIN: "Is the swelling painful to touch?" If Yes, ask: "How painful is it?"   (Scale 1-10; mild, moderate or severe)     10 6. FEVER: "Do you have a fever?" If Yes, ask: "What is it, how was it measured, and when did it start?"      No 7. CAUSE: "What do you think is causing the leg swelling?"     Unsure 8. MEDICAL HISTORY: "Do you have a history of heart failure, kidney disease, liver failure, or cancer?"     Yes 9. RECURRENT SYMPTOM: "Have you had leg swelling before?" If Yes, ask: "When was the last time?" "What happened that time?"     Yes 10. OTHER SYMPTOMS: "Do you have any other symptoms?" (e.g., chest pain, difficulty breathing)       Weakness to legs 11. PREGNANCY: "Is there any chance you are pregnant?" "When was your last menstrual period?"       No  Protocols used: LEG SWELLING AND EDEMA-A-AH

## 2020-10-27 NOTE — Telephone Encounter (Signed)
Patient called stating that she had been advised to take a fluid pill twice daily to help with swelling in both legs Patient felt there was little/no improvement since beginning the medication  Patient was transferred to nurse triage for further assistance

## 2020-10-28 NOTE — Telephone Encounter (Signed)
Patient needs evaluation in office.

## 2020-10-29 NOTE — Telephone Encounter (Signed)
Patient advised and ppt scheduled for 1/7 with [facility]. KW

## 2020-10-30 ENCOUNTER — Telehealth (INDEPENDENT_AMBULATORY_CARE_PROVIDER_SITE_OTHER): Payer: Medicare Other | Admitting: Physician Assistant

## 2020-10-30 DIAGNOSIS — M7989 Other specified soft tissue disorders: Secondary | ICD-10-CM

## 2020-10-30 MED ORDER — TORSEMIDE 20 MG PO TABS: 20.0000 mg | ORAL_TABLET | Freq: Every day | ORAL | 1 refills | Status: DC

## 2020-10-30 NOTE — Progress Notes (Signed)
MyChart Video Visit    Virtual Visit via Video Note   This visit type was conducted due to national recommendations for restrictions regarding the COVID-19 Pandemic (e.g. social distancing) in an effort to limit this patient's exposure and mitigate transmission in our community. This patient is at least at moderate risk for complications without adequate follow up. This format is felt to be most appropriate for this patient at this time. Physical exam was limited by quality of the video and audio technology used for the visit.   Interactive audio and video communications were attempted, although failed due to patient's inability to connect to video. Continued visit with audio only interaction with patient agreement.  Patient location: Home Provider location: Home office in [place]  I discussed the limitations of evaluation and management by telemedicine and the availability of in person appointments. The patient expressed understanding and agreed to proceed.  Patient: Suzanne Avila   DOB: July 04, 1946   75 y.o. Female  MRN: [id number] Visit Date: 10/30/2020  Today's healthcare provider: Mar Daring, PA-C   No chief complaint on file.  Subjective    HPI   Patient reports leg swelling has worsened. Swelling is now to her mid shins. Does not improve with elevation or overnight, like previously. Has also been taking Furosemide 40mg  daily without improvement.   Patient Active Problem List   Diagnosis Date Noted   Cavitary lesion of lung 09/15/2020   AF (paroxysmal atrial fibrillation) ([place]) 09/15/2020   Failure to thrive in adult 09/15/2020   Severe protein-calorie malnutrition Altamease Oiler: less than 60% of standard weight) ([place]) 09/15/2020   Dysphagia 09/15/2020   Hypotension 09/15/2020   Abscess of right lung with pneumonia ([place]) 09/14/2020   Chronic low back pain (Left) w/o sciatica 09/10/2020   Pharmacologic therapy 05/05/2020   Chronic diarrhea  05/04/2020   DDD (degenerative disc disease), lumbosacral 11/19/2019   Therapeutic opioid-induced constipation (OIC) 08/01/2019   Chronic sacroiliac joint pain (Left) 05/01/2019   Chronic groin pain (Left) 05/01/2019   Spondylosis without myelopathy or radiculopathy, lumbosacral region 05/01/2019   Other specified dorsopathies, sacral and sacrococcygeal region 05/01/2019   Renal artery stenosis ([place]) 12/27/2017   Pain in limb 12/22/2017   Tobacco use disorder 12/22/2017   Renal atrophy, right 12/22/2017   Thoracic radiculitis 01/31/2017   Chronic lower extremity pain (Primary Area of Pain) (Bilateral) (L>R) 04/25/2016   Chronic upper extremity pain (Third area of Pain) (Bilateral) (L>R) 04/25/2016   Disturbance of skin sensation 04/20/2016   Neurogenic pain 04/20/2016   Chronic hip pain (Bilateral) (L>R) 11/11/2015   Vitamin D deficiency 11/11/2015   Edema 09/02/2015   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   Encounter for therapeutic drug level monitoring 08/14/2015   Long term current use of opiate analgesic 08/14/2015   Long term prescription opiate use 12/87/8676   Uncomplicated opioid dependence ([place]) 08/14/2015   Opiate use (30 MME/Day) 08/14/2015   Chronic low back pain (2ry area of Pain) (Bilateral) (L>R) w/ sciatica (Bilateral) 08/14/2015   Chronic pain syndrome 72/06/4708   Alcoholic ([place]) 62/83/6629   B12 deficiency 08/14/2015   Narrowing of intervertebral disc space 08/14/2015   Encounter for screening for diabetes mellitus 08/14/2015   History of metabolic disorder 47/65/4650   Fungal infection of toenail 08/14/2015   Osteopenia 08/14/2015   Compulsive tobacco user syndrome 08/14/2015   Osteoarthritis of hip (Left) 08/14/2015   Chronic hip pain (Left) 08/14/2015   Chronic lumbar radicular pain (L5 dermatome) (Bilateral) (L>R)  08/14/2015   Lumbar facet syndrome (Bilateral) (L>R) 08/14/2015   Lumbar spondylosis  08/14/2015   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) 08/14/2015   Lumbar Postlaminectomy syndrome (L4-5) 08/14/2015   Fibromyalgia 08/14/2015   Chronic airway obstruction (HCC) 04/21/2015   Allergic rhinitis 02/10/2010   Atrial paroxysmal tachycardia (HCC) 07/06/2009   CAFL (chronic airflow limitation) (HCC) 07/06/2009   Acid reflux 07/06/2009   Glaucoma 07/06/2009   Essential hypertension 07/06/2009   Hypercholesterolemia without hypertriglyceridemia 07/06/2009   Absence of bladder continence 07/06/2009   Past Medical History:  Diagnosis Date   Closed left arm fracture    COPD (chronic obstructive pulmonary disease) (HCC)    Glaucoma    Hyperlipidemia    Hypertension    Lung mass       Medications: Outpatient Medications Prior to Visit  Medication Sig   albuterol (PROVENTIL) (2.5 MG/3ML) 0.083% nebulizer solution Take 3 mLs (2.5 mg total) by nebulization every 6 (six) hours as needed for wheezing or shortness of breath.   aspirin 325 MG tablet Take 1 tablet (325 mg total) by mouth daily.   atorvastatin (LIPITOR) 40 MG tablet Take 1 tablet (40 mg total) by mouth daily.   Budeson-Glycopyrrol-Formoterol (BREZTRI AEROSPHERE) 160-9-4.8 MCG/ACT AERO Inhale 2 puffs into the lungs in the morning and at bedtime.   diltiazem (CARDIZEM) 120 MG tablet Take 1 tablet (120 mg total) by mouth daily.   dorzolamide (TRUSOPT) 2 % ophthalmic solution Place 1 drop into both eyes 2 (two) times daily.   furosemide (LASIX) 20 MG tablet Take 1 tablet (20 mg total) by mouth daily.   HYDROcodone-acetaminophen (NORCO) 7.5-325 MG tablet Take 1 tablet by mouth every 6 (six) hours as needed for severe pain. Must last 30 days   latanoprost (XALATAN) 0.005 % ophthalmic solution Place 1 drop into both eyes at bedtime.   lipase/protease/amylase 24000-76000 units CPEP Take 1 capsule (24,000 Units total) by mouth 3 (three) times daily before meals.   magnesium gluconate  (MAGONATE) 500 MG tablet Take 500 mg by mouth daily.   mirtazapine (REMERON) 7.5 MG tablet Take 1 tablet (7.5 mg total) by mouth at bedtime.   Multiple Vitamin (MULTI-VITAMIN) tablet Take 1 tablet by mouth daily.    omeprazole (PRILOSEC) 40 MG capsule Take 1 capsule (40 mg total) by mouth daily.   pregabalin (LYRICA) 150 MG capsule Take 1 capsule (150 mg total) by mouth every 8 (eight) hours.   ramipril (ALTACE) 2.5 MG capsule Take 1 capsule (2.5 mg total) by mouth daily.   rOPINIRole (REQUIP) 2 MG tablet Take 1 tablet (2 mg total) by mouth 2 (two) times daily.   spironolactone (ALDACTONE) 25 MG tablet Take 1 tablet (25 mg total) by mouth daily.   vitamin B-12 (CYANOCOBALAMIN) 500 MCG tablet Take 1 tablet (500 mcg total) by mouth daily.   No facility-administered medications prior to visit.    Review of Systems  Last CBC Lab Results  Component Value Date   WBC 12.5 (H) 09/15/2020   HGB 9.9 (L) 09/15/2020   HCT 27.5 (L) 09/15/2020   MCV 90.2 09/15/2020   MCH 32.5 09/15/2020   RDW 13.1 09/15/2020   PLT 321 09/15/2020   Last metabolic panel Lab Results  Component Value Date   GLUCOSE 74 09/17/2020   NA 133 (L) 09/17/2020   K 3.8 09/17/2020   CL 106 09/17/2020   CO2 20 (L) 09/17/2020   BUN 9 09/17/2020   CREATININE 0.56 09/17/2020   GFRNONAA >60 09/17/2020  GFRAA 61 02/03/2020   CALCIUM 8.1 (L) 09/17/2020   PHOS 2.5 09/17/2020   PROT 4.9 (L) 09/16/2020   ALBUMIN 2.1 (L) 09/16/2020   LABGLOB 2.3 02/03/2020   AGRATIO 1.8 02/03/2020   BILITOT 0.6 09/16/2020   ALKPHOS 54 09/16/2020   AST 18 09/16/2020   ALT 13 09/16/2020   ANIONGAP 7 09/17/2020      Objective    There were no vitals taken for this visit. BP Readings from Last 3 Encounters:  10/21/20 114/67  09/23/20 (!) 142/83  09/22/20 130/78   Wt Readings from Last 3 Encounters:  10/21/20 95 lb 11.2 oz (43.4 kg)  09/23/20 87 lb 1.6 oz (39.5 kg)  09/22/20 88 lb 6.4 oz (40.1 kg)      Physical Exam      Assessment & Plan     1. Leg swelling Swelling not responding to previous conservative measures with elevating legs and furosemide. Have advised to get compression stockings, continue elevating legs. Change furosemide to torsemide as below. F/U in 1-2 weeks.  - torsemide (DEMADEX) 20 MG tablet; Take 1-2 tablets (20-40 mg total) by mouth daily.  Dispense: 60 tablet; Refill: 1   No follow-ups on file.     I discussed the assessment and treatment plan with the patient. The patient was provided an opportunity to ask questions and all were answered. The patient agreed with the plan and demonstrated an understanding of the instructions.   The patient was advised to call back or seek an in-person evaluation if the symptoms worsen or if the condition fails to improve as anticipated.  I provided 21 minutes of non-face-to-face time during this encounter.  Delmer Islam, PA-C, have reviewed all documentation for this visit. The documentation on 10/31/20 for the exam, diagnosis, procedures, and orders are all accurate and complete.  Reine Just [facility] [phone] (phone) [phone] (fax)  [facility]

## 2020-10-31 ENCOUNTER — Encounter: Payer: Self-pay | Admitting: Physician Assistant

## 2020-11-01 NOTE — Progress Notes (Signed)
PROVIDER NOTE: Information contained herein reflects review and annotations entered in association with encounter. Interpretation of such information and data should be left to medically-trained personnel. Information provided to patient can be located elsewhere in the medical record under "Patient Instructions". Document created using STT-dictation technology, any transcriptional errors that may result from process are unintentional.    Patient: Suzanne Avila  Service Category: E/M  Provider: Gaspar Cola, MD  DOB: 25-Feb-1946  DOS: 11/02/2020  Specialty: Interventional Pain Management  MRN: [id number]  Setting: Ambulatory outpatient  PCP: Mar Daring, PA-C  Type: Established Patient    Referring Provider: Florian Buff*  Location: Office  Delivery: Face-to-face     HPI  Suzanne Avila, a 75 y.o. year old female, is here today because of her Chronic pain syndrome [G89.4]. Suzanne Avila primary complain today is Back Pain (Lower back) Last encounter: My last encounter with her was on 08/25/2020. 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); Lumbar Postlaminectomy syndrome (L4-5); Fibromyalgia; Chronic hip pain (Bilateral) (L>R); Neurogenic pain; Chronic lower extremity pain (Primary Area of Pain) (Bilateral) (L>R); Chronic upper extremity pain (Third area of Pain) (Bilateral) (L>R); Thoracic radiculitis; Pain in limb; Chronic sacroiliac joint pain (Left); Chronic groin pain (Left); Spondylosis without myelopathy or radiculopathy, lumbosacral region; Other specified  dorsopathies, sacral and sacrococcygeal region; DDD (degenerative disc disease), lumbosacral; Chronic low back pain (Left) w/o sciatica; Avascular necrosis of femoral head (Right) ([place]); and 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 Pain Assessment: Severity of Chronic pain is reported as a 7 /10. Location: Back Lower,Mid/Denies. Onset: More than a month ago. Quality: Aching,Burning,Throbbing,Sharp. Timing: Constant. Modifying factor(s): meds and sit down. Vitals:  height is $RemoveB'4\' 11"'hXJuaCIc$  (1.499 m) and weight is 85 lb (38.6 kg). Her temperature is 97.1 F (36.2 C) (abnormal). Her blood pressure is 120/84 and her pulse is 82. Her oxygen saturation is 98%.   Reason for encounter: medication management.  The patient walks into the clinic today using a cane and she is walking with an exaggerated flexion of her lumbar spine.  She refers that she is unable to stand up straight secondary to the pain going down the lower back and left hip area.  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  However, this pain that she is having today appears to be secondary to a lumbar spinal stenosis.  Review of the patient's images demonstrated a 7 mm central spinal stenosis at the L2-3 level, likely to be responsible for the patient's symptoms.  Because of this, I will be scheduling her to return for a left-sided L2-3 LESI under fluoroscopic guidance and IV sedation.  RTCB: 01/31/2021 Transferred nonopioids on 07/27/2020: Lyrica  Pharmacotherapy Assessment   Analgesic: Hydrocodone/APAP 7.5/325, 1 tab PO q 6 hrs (30 mg/day of hydrocodone) MME/day:30 mg/day.   Monitoring: [place] PMP: PDMP reviewed during this encounter.       Pharmacotherapy: No side-effects or adverse reactions reported. Compliance: No problems identified. Effectiveness: Clinically acceptable.  Chauncey Fischer, RN  11/02/2020 11:11 AM  Sign when Signing Visit Nursing Pain  Medication Assessment:  Safety precautions  to be maintained throughout the outpatient stay will include: orient to surroundings, keep bed in low position, maintain call bell within reach at all times, provide assistance with transfer out of bed and ambulation.  Medication Inspection Compliance: Suzanne Avila did not comply with our request to bring her pills to be counted. She was reminded that bringing the medication bottles, even when empty, is a requirement.  Medication: None brought in. Pill/Patch Count: None available to be counted. Bottle Appearance: No container available. Did not bring bottle(s) to appointment. Filled Date: N/A Last Medication intake:  TodaySafety precautions to be maintained throughout the outpatient stay will include: orient to surroundings, keep bed in low position, maintain call bell within reach at all times, provide assistance with transfer out of bed and ambulation.     UDS:  Summary  Date Value Ref Range Status  05/06/2020 Note  Final    Comment:    ==================================================================== [employer] [facility] (MW) ==================================================================== Specimen Alert Note: Urinary creatinine is low; ability to detect some drugs may be compromised. Interpret results with caution. (Creatinine) ==================================================================== Test                             Result       Flag       Units  Drug Present and Declared for Prescription Verification   Hydrocodone                    3460         EXPECTED   ng/mg creat   Hydromorphone                  1380         EXPECTED   ng/mg creat   Dihydrocodeine                 580          EXPECTED   ng/mg creat   Norhydrocodone                 2430         EXPECTED   ng/mg creat    Sources of hydrocodone include scheduled prescription medications.    Hydromorphone, dihydrocodeine and norhydrocodone are expected    metabolites of  hydrocodone. Hydromorphone and dihydrocodeine are    also available as scheduled prescription medications.  ==================================================================== Test                      Result    Flag   Units      Ref Range   Creatinine              10        LL     mg/dL      >=20 ==================================================================== Declared Medications:  The flagging and interpretation on this report are based on the  following declared medications.  Unexpected results may arise from  inaccuracies in the declared medications.   **Note: The testing scope of this panel includes these medications:   Hydrocodone   **Note: The testing scope of this panel does not include the  following reported medications:   Acetaminophen  Albuterol  Aspirin  Atorvastatin  Cyanocobalamin  Diltiazem  Dorzolamide  Fluticasone (Advair)  Furosemide (Lasix)  Latanoprost  Magnesium  Meloxicam  Multivitamin  Omeprazole  Pregabalin (Lyrica)  Ramipril  Ropinirole  Salmeterol (Advair)  Spironolactone ==================================================================== For clinical consultation, please call [phone]. ====================================================================      ROS  Constitutional: Denies any fever or chills Gastrointestinal: No reported hemesis, hematochezia, vomiting, or acute GI distress Musculoskeletal: Denies any acute onset joint swelling, redness, loss of ROM, or weakness Neurological: No reported episodes of acute onset apraxia, aphasia, dysarthria, agnosia, amnesia, paralysis, loss of coordination, or loss of consciousness  Medication Review  Budeson-Glycopyrrol-Formoterol, HYDROcodone-acetaminophen, Multi-Vitamin, Pancrelipase (Lip-Prot-Amyl), albuterol, aspirin, atorvastatin, diltiazem, dorzolamide, latanoprost, magnesium gluconate, mirtazapine, omeprazole, pregabalin, rOPINIRole, ramipril, spironolactone, torsemide,  and vitamin B-12  History Review  Allergy: Suzanne Avila is allergic to codeine, cyclobenzaprine, and penicillins. Drug: Suzanne Avila  reports no history of drug use. Alcohol:  reports current alcohol use of about 35.0 standard drinks of alcohol per week. Tobacco:  reports that she has been smoking cigarettes. She has a 75.00 pack-year smoking history. She has never used smokeless tobacco. Social: Suzanne Avila  reports that she has been smoking cigarettes. She has a 75.00 pack-year smoking history. She has never used smokeless tobacco. She reports current alcohol use of about 35.0 standard drinks of alcohol per week. She reports that she does not use drugs. Medical:  has a past medical history of Closed left arm fracture, COPD (chronic obstructive pulmonary disease) ([place]), Glaucoma, Hyperlipidemia, Hypertension, and Lung mass. Surgical: Suzanne Avila  has a past surgical history that includes Back surgery; Eye surgery; and Carpal tunnel release (Left). Family: family history includes Cancer in her father and mother.  Laboratory Chemistry Profile   Renal Lab Results  Component Value Date   BUN 9 09/17/2020   CREATININE 0.56 09/17/2020   BCR 12 02/03/2020   GFRAA 61 02/03/2020   GFRNONAA >60 09/17/2020     Hepatic Lab Results  Component Value Date   AST 18 09/16/2020   ALT 13 09/16/2020   ALBUMIN 2.1 (L) 09/16/2020   ALKPHOS 54 09/16/2020     Electrolytes Lab Results  Component Value Date   NA 133 (L) 09/17/2020   K 3.8 09/17/2020   CL 106 09/17/2020   CALCIUM 8.1 (L) 09/17/2020   MG 1.6 (L) 09/17/2020   PHOS 2.5 09/17/2020     Bone Lab Results  Component Value Date   VD25OH 45.9 02/03/2020     Inflammation (CRP: Acute Phase) (ESR: Chronic Phase) Lab Results  Component Value Date   CRP <0.8 02/14/2017   ESRSEDRATE 20 02/14/2017   LATICACIDVEN 1.1 09/15/2020       Note: Above Lab results reviewed.  Recent Imaging Review  CT CHEST WO CONTRAST CLINICAL DATA:  Lung  mass, follow-up of lung lesion discovered on lung cancer screening evaluation in this 75 year old female  EXAM: CT CHEST WITHOUT CONTRAST  TECHNIQUE: Multidetector CT imaging of the chest was performed following the standard protocol without IV contrast.  COMPARISON:  PET exam dated September 02, 2020 and CT of the chest from August 20, 2020  FINDINGS: Cardiovascular: Aortic atherosclerosis. 3.6 cm ascending thoracic aortic caliber.  Heart size is normal without pericardial effusion. Central pulmonary vasculature is normal caliber.  Mediastinum/Nodes: Thoracic inlet structures are normal.  Subcarinal lymph node measuring 11 mm (image 62, series 2) previously 9 mm on prior PET scan.  Hilar structures without gross adenopathy. Suggestion of mild RIGHT hilar nodal prominence. About the RIGHT hilum (image 55, series 2) less than a cm at 6 mm  Lungs/Pleura: . 4.4 x 4.1 cm area of cavitation in the RIGHT upper lobe previously 5.1 x 4.4 cm, showing less fluid in the cavity. Diminished ground-glass in the area  surrounding the lesion still with some extension to the pleural surface along the peripheral RIGHT chest. No additional pulmonary nodules. No pleural effusion. Airways are patent.  Upper Abdomen: Imaged portions of the liver, gallbladder, pancreas, spleen, adrenal glands and kidneys without acute process. Marked atrophy of the RIGHT as compared to the LEFT kidney as before. No acute upper abdominal process related gastrointestinal structures. Patulous esophagus may be related to esophageal dysmotility.  Musculoskeletal: No acute bone finding. No destructive bone process.  IMPRESSION: 1. Decreased size of cavitary area in the RIGHT upper lobe with thinner wall and less associated fluid is most suggestive of resolving pulmonary abscess. Would however suggest continued close follow-up to exclude superinfected neoplasm and ensure resolution. Consider 4-6 week evaluation to  ensure continued decrease in size. 2. Mild increased size of subcarinal nodal tissue is as discussed, potentially reactive. Attention on short interval follow-up. 3. Marked atrophy of the RIGHT as compared to the LEFT kidney as before. 4. Aortic atherosclerosis.  Aortic Atherosclerosis (ICD10-I70.0).  Electronically Signed   By: Donzetta Kohut M.D.   On: 10/07/2020 11:00 Note: Reviewed        Physical Exam  General appearance: Well nourished, well developed, and well hydrated. In no apparent acute distress Mental status: Alert, oriented x 3 (person, place, & time)       Respiratory: No evidence of acute respiratory distress Eyes: PERLA Vitals: BP 120/84    Pulse 82    Temp (!) 97.1 F (36.2 C)    Ht 4\' 11"  (1.499 m)    Wt 85 lb (38.6 kg)    SpO2 98%    BMI 17.17 kg/m  BMI: Estimated body mass index is 17.17 kg/m as calculated from the following:   Height as of this encounter: 4\' 11"  (1.499 m).   Weight as of this encounter: 85 lb (38.6 kg). Ideal: Female patients must weigh at least 45.5 kg to calculate ideal body weight  Assessment   Status Diagnosis  Controlled Worsened Controlled 1. Chronic pain syndrome   2. Chronic low back pain (Left) w/o sciatica   3. 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 Lumbar facet syndrome (Bilateral) (L>R)   5. Pharmacologic therapy   6. Uncomplicated opioid dependence (HCC)   7. Spinal stenosis of lumbar region with neurogenic claudication   8. Avascular necrosis of femoral head (Right) (HCC)   9. 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)      Updated Problems: Problem  Avascular necrosis of femoral head (Right) (HCC)  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)    Plan of Care  Problem-specific:  No problem-specific Assessment & Plan notes found for this encounter.  Suzanne Avila has a current medication list which includes the following long-term medication(s): albuterol, atorvastatin, diltiazem,  hydrocodone-acetaminophen, [START ON 12/02/2020] hydrocodone-acetaminophen, [START ON 01/01/2021] hydrocodone-acetaminophen, mirtazapine, omeprazole, pregabalin, ramipril, ropinirole, spironolactone, and torsemide.  Pharmacotherapy (Medications Ordered): Meds ordered this encounter  Medications   HYDROcodone-acetaminophen (NORCO) 7.5-325 MG tablet    Sig: Take 1 tablet by mouth every 6 (six) hours as needed for severe pain. Must last 30 days    Dispense:  120 tablet    Refill:  0    Chronic Pain: STOP Act (Not applicable) Fill 1 day early if closed on refill date. Avoid benzodiazepines within 8 hours of opioids   HYDROcodone-acetaminophen (NORCO) 7.5-325 MG tablet    Sig: Take 1 tablet by mouth every 6 (six) hours as needed for severe pain. Must last  30 days    Dispense:  120 tablet    Refill:  0    Chronic Pain: STOP Act (Not applicable) Fill 1 day early if closed on refill date. Avoid benzodiazepines within 8 hours of opioids   HYDROcodone-acetaminophen (NORCO) 7.5-325 MG tablet    Sig: Take 1 tablet by mouth every 6 (six) hours as needed for severe pain. Must last 30 days    Dispense:  120 tablet    Refill:  0    Chronic Pain: STOP Act (Not applicable) Fill 1 day early if closed on refill date. Avoid benzodiazepines within 8 hours of opioids   Orders:  Orders Placed This Encounter  Procedures   Lumbar Epidural Injection    Standing Status:   Future    Standing Expiration Date:   12/03/2020    Scheduling Instructions:     Procedure: Interlaminar Lumbar Epidural Steroid injection (LESI)  L2-3     Laterality: Left-sided     Sedation: Patient's choice.     Timeframe: ASAA    Order Specific Question:   Where will this procedure be performed?    Answer:   [facility]   Follow-up plan:   Return for Procedure (w/ sedation): (L) L2-3 LESI #1.      Considering:   Diagnostic bilateral IA hip joint injection  Possible bilateral hip joint RFA  Possible bilateral lumbar facet  RFA  Possible left SI joint RFA  Diagnostic bilateral L4-L5 TFESI  Diagnostic caudal ESI  Diagnostic caudal epidurogram  Possible RACZ epidural lysis of adhesions  Diagnostic left L2-3 LESI  Possible bilateral Lumbar SCS trial implant    Palliative PRN treatment(s):   Diagnostic right lumbar facet block #2  Diagnostic left lumbar facet block #3  Diagnostic left SI joint block #3     Recent Visits Date Type Provider Dept  09/10/20 Telemedicine Milinda Pointer, MD [facility]  08/25/20 Procedure visit Milinda Pointer, MD [facility]  Showing recent visits within past 90 days and meeting all other requirements Today's Visits Date Type Provider Dept  11/02/20 Office Visit Milinda Pointer, MD [facility]  Showing today's visits and meeting all other requirements Future Appointments Date Type Provider Dept  11/10/20 Appointment Milinda Pointer, MD [facility]  01/27/21 Appointment Milinda Pointer, MD [facility]  Showing future appointments within next 90 days and meeting all other requirements  I discussed the assessment and treatment plan with the patient. The patient was provided an opportunity to ask questions and all were answered. The patient agreed with the plan and demonstrated an understanding of the instructions.  Patient advised to call back or seek an in-person evaluation if the symptoms or condition worsens.  Duration of encounter: 30 minutes.  Note by: Gaspar Cola, MD Date: 11/02/2020; Time: 12:01 PM

## 2020-11-02 ENCOUNTER — Ambulatory Visit: Payer: Medicare Other | Attending: Pain Medicine | Admitting: Pain Medicine

## 2020-11-02 ENCOUNTER — Encounter: Payer: Self-pay | Admitting: Pain Medicine

## 2020-11-02 ENCOUNTER — Other Ambulatory Visit: Payer: Self-pay

## 2020-11-02 VITALS — BP 120/84 | HR 82 | Temp 97.1°F | Ht 59.0 in | Wt 85.0 lb

## 2020-11-02 DIAGNOSIS — G894 Chronic pain syndrome: Secondary | ICD-10-CM | POA: Diagnosis present

## 2020-11-02 DIAGNOSIS — G8929 Other chronic pain: Secondary | ICD-10-CM | POA: Diagnosis present

## 2020-11-02 DIAGNOSIS — F1120 Opioid dependence, uncomplicated: Secondary | ICD-10-CM | POA: Insufficient documentation

## 2020-11-02 DIAGNOSIS — M48062 Spinal stenosis, lumbar region with neurogenic claudication: Secondary | ICD-10-CM | POA: Insufficient documentation

## 2020-11-02 DIAGNOSIS — M545 Low back pain, unspecified: Secondary | ICD-10-CM | POA: Diagnosis present

## 2020-11-02 DIAGNOSIS — M87051 Idiopathic aseptic necrosis of right femur: Secondary | ICD-10-CM | POA: Diagnosis present

## 2020-11-02 DIAGNOSIS — Z79899 Other long term (current) drug therapy: Secondary | ICD-10-CM | POA: Diagnosis present

## 2020-11-02 DIAGNOSIS — M87052 Idiopathic aseptic necrosis of left femur: Secondary | ICD-10-CM | POA: Diagnosis present

## 2020-11-02 DIAGNOSIS — M47816 Spondylosis without myelopathy or radiculopathy, lumbar region: Secondary | ICD-10-CM | POA: Diagnosis present

## 2020-11-02 DIAGNOSIS — M961 Postlaminectomy syndrome, not elsewhere classified: Secondary | ICD-10-CM | POA: Insufficient documentation

## 2020-11-02 MED ORDER — HYDROCODONE-ACETAMINOPHEN 7.5-325 MG PO TABS: 1.0000 | ORAL_TABLET | Freq: Four times a day (QID) | ORAL | 0 refills | Status: DC | PRN

## 2020-11-02 NOTE — Progress Notes (Signed)
Nursing Pain Medication Assessment:  Safety precautions to be maintained throughout the outpatient stay will include: orient to surroundings, keep bed in low position, maintain call bell within reach at all times, provide assistance with transfer out of bed and ambulation.  Medication Inspection Compliance: Suzanne Avila did not comply with our request to bring her pills to be counted. She was reminded that bringing the medication bottles, even when empty, is a requirement.  Medication: None brought in. Pill/Patch Count: None available to be counted. Bottle Appearance: No container available. Did not bring bottle(s) to appointment. Filled Date: N/A Last Medication intake:  TodaySafety precautions to be maintained throughout the outpatient stay will include: orient to surroundings, keep bed in low position, maintain call bell within reach at all times, provide assistance with transfer out of bed and ambulation.

## 2020-11-02 NOTE — Patient Instructions (Signed)
____________________________________________________________________________________________  General Risks and Possible Complications  Patient Responsibilities: It is important that you read this as it is part of your informed consent. It is our duty to inform you of the risks and possible complications associated with treatments offered to you. It is your responsibility as a patient to read this and to ask questions about anything that is not clear or that you believe was not covered in this document.  Patient's Rights: You have the right to refuse treatment. You also have the right to change your mind, even after initially having agreed to have the treatment done. However, under this last option, if you wait until the last second to change your mind, you may be charged for the materials used up to that point.  Introduction: Medicine is not an exact science. Everything in Medicine, including the lack of treatment(s), carries the potential for danger, harm, or loss (which is by definition: Risk). In Medicine, a complication is a secondary problem, condition, or disease that can aggravate an already existing one. All treatments carry the risk of possible complications. The fact that a side effects or complications occurs, does not imply that the treatment was conducted incorrectly. It must be clearly understood that these can happen even when everything is done following the highest safety standards.  No treatment: You can choose not to proceed with the proposed treatment alternative. The "PRO(s)" would include: avoiding the risk of complications associated with the therapy. The "CON(s)" would include: not getting any of the treatment benefits. These benefits fall under one of three categories: diagnostic; therapeutic; and/or palliative. Diagnostic benefits include: getting information which can ultimately lead to improvement of the disease or symptom(s). Therapeutic benefits are those associated with the  successful treatment of the disease. Finally, palliative benefits are those related to the decrease of the primary symptoms, without necessarily curing the condition (example: decreasing the pain from a flare-up of a chronic condition, such as incurable terminal cancer).  General Risks and Complications: These are associated to most interventional treatments. They can occur alone, or in combination. They fall under one of the following six (6) categories: no benefit or worsening of symptoms; bleeding; infection; nerve damage; allergic reactions; and/or death. 1. No benefits or worsening of symptoms: In Medicine there are no guarantees, only probabilities. No healthcare provider can ever guarantee that a medical treatment will work, they can only state the probability that it may. Furthermore, there is always the possibility that the condition may worsen, either directly, or indirectly, as a consequence of the treatment. 2. Bleeding: This is more common if the patient is taking a blood thinner, either prescription or over the counter (example: Goody Powders, Fish oil, Aspirin, Garlic, etc.), or if suffering a condition associated with impaired coagulation (example: Hemophilia, cirrhosis of the liver, low platelet counts, etc.). However, even if you do not have one on these, it can still happen. If you have any of these conditions, or take one of these drugs, make sure to notify your treating physician. 3. Infection: This is more common in patients with a compromised immune system, either due to disease (example: diabetes, cancer, human immunodeficiency virus [HIV], etc.), or due to medications or treatments (example: therapies used to treat cancer and rheumatological diseases). However, even if you do not have one on these, it can still happen. If you have any of these conditions, or take one of these drugs, make sure to notify your treating physician. 4. Nerve Damage: This is more common when the   treatment is  an invasive one, but it can also happen with the use of medications, such as those used in the treatment of cancer. The damage can occur to small secondary nerves, or to large primary ones, such as those in the spinal cord and brain. This damage may be temporary or permanent and it may lead to impairments that can range from temporary numbness to permanent paralysis and/or brain death. 5. Allergic Reactions: Any time a substance or material comes in contact with our body, there is the possibility of an allergic reaction. These can range from a mild skin rash (contact dermatitis) to a severe systemic reaction (anaphylactic reaction), which can result in death. 6. Death: In general, any medical intervention can result in death, most of the time due to an unforeseen complication. ____________________________________________________________________________________________  ____________________________________________________________________________________________  Preparing for Procedure with Sedation  Procedure appointments are limited to planned procedures: . No Prescription Refills. . No disability issues will be discussed. . No medication changes will be discussed.  Instructions: . Oral Intake: Do not eat or drink anything for at least 8 hours prior to your procedure. (Exception: Blood Pressure Medication. See below.) . Transportation: Unless otherwise stated by your physician, you may drive yourself after the procedure. . Blood Pressure Medicine: Do not forget to take your blood pressure medicine with a sip of water the morning of the procedure. If your Diastolic (lower reading)is above 100 mmHg, elective cases will be cancelled/rescheduled. . Blood thinners: These will need to be stopped for procedures. Notify our staff if you are taking any blood thinners. Depending on which one you take, there will be specific instructions on how and when to stop it. . Diabetics on insulin: Notify the staff  so that you can be scheduled 1st case in the morning. If your diabetes requires high dose insulin, take only  of your normal insulin dose the morning of the procedure and notify the staff that you have done so. . Preventing infections: Shower with an antibacterial soap the morning of your procedure. . Build-up your immune system: Take 1000 mg of Vitamin C with every meal (3 times a day) the day prior to your procedure. . Antibiotics: Inform the staff if you have a condition or reason that requires you to take antibiotics before dental procedures. . Pregnancy: If you are pregnant, call and cancel the procedure. . Sickness: If you have a cold, fever, or any active infections, call and cancel the procedure. . Arrival: You must be in the facility at least 30 minutes prior to your scheduled procedure. . Children: Do not bring children with you. . Dress appropriately: Bring dark clothing that you would not mind if they get stained. . Valuables: Do not bring any jewelry or valuables.  Reasons to call and reschedule or cancel your procedure: (Following these recommendations will minimize the risk of a serious complication.) . Surgeries: Avoid having procedures within 2 weeks of any surgery. (Avoid for 2 weeks before or after any surgery). . Flu Shots: Avoid having procedures within 2 weeks of a flu shots or . (Avoid for 2 weeks before or after immunizations). . Barium: Avoid having a procedure within 7-10 days after having had a radiological study involving the use of radiological contrast. (Myelograms, Barium swallow or enema study). . Heart attacks: Avoid any elective procedures or surgeries for the initial 6 months after a "Myocardial Infarction" (Heart Attack). . Blood thinners: It is imperative that you stop these medications before procedures. Let us know if you   if you take any blood thinner.  . Infection: Avoid procedures during or within two weeks of an infection (including chest colds or  gastrointestinal problems). Symptoms associated with infections include: Localized redness, fever, chills, night sweats or profuse sweating, burning sensation when voiding, cough, congestion, stuffiness, runny nose, sore throat, diarrhea, nausea, vomiting, cold or Flu symptoms, recent or current infections. It is specially important if the infection is over the area that we intend to treat. . Heart and lung problems: Symptoms that may suggest an active cardiopulmonary problem include: cough, chest pain, breathing difficulties or shortness of breath, dizziness, ankle swelling, uncontrolled high or unusually low blood pressure, and/or palpitations. If you are experiencing any of these symptoms, cancel your procedure and contact your primary care physician for an evaluation.  Remember:  Regular Business hours are:  Monday to Thursday 8:00 AM to 4:00 PM  Provider's Schedule: Yasuo Phimmasone, MD:  Procedure days: Tuesday and Thursday 7:30 AM to 4:00 PM  Bilal Lateef, MD:  Procedure days: Monday and Wednesday 7:30 AM to 4:00 PM ____________________________________________________________________________________________    

## 2020-11-09 ENCOUNTER — Ambulatory Visit: Payer: Self-pay | Admitting: Physician Assistant

## 2020-11-10 ENCOUNTER — Other Ambulatory Visit: Payer: Self-pay

## 2020-11-10 ENCOUNTER — Ambulatory Visit (HOSPITAL_BASED_OUTPATIENT_CLINIC_OR_DEPARTMENT_OTHER): Payer: Medicare Other | Admitting: Pain Medicine

## 2020-11-10 ENCOUNTER — Ambulatory Visit
Admission: RE | Admit: 2020-11-10 | Discharge: 2020-11-10 | Disposition: A | Discharge status: Home / Self Care | Payer: Medicare Other | Source: Ambulatory Visit | Attending: Pain Medicine | Admitting: Pain Medicine

## 2020-11-10 ENCOUNTER — Encounter: Payer: Self-pay | Admitting: Pain Medicine

## 2020-11-10 VITALS — BP 124/80 | HR 69 | Temp 97.9°F | Resp 16 | Ht 59.0 in | Wt 95.0 lb

## 2020-11-10 DIAGNOSIS — M961 Postlaminectomy syndrome, not elsewhere classified: Secondary | ICD-10-CM | POA: Diagnosis present

## 2020-11-10 DIAGNOSIS — M79604 Pain in right leg: Secondary | ICD-10-CM | POA: Diagnosis present

## 2020-11-10 DIAGNOSIS — M5441 Lumbago with sciatica, right side: Secondary | ICD-10-CM | POA: Insufficient documentation

## 2020-11-10 DIAGNOSIS — M48062 Spinal stenosis, lumbar region with neurogenic claudication: Secondary | ICD-10-CM

## 2020-11-10 DIAGNOSIS — G8929 Other chronic pain: Secondary | ICD-10-CM | POA: Diagnosis present

## 2020-11-10 DIAGNOSIS — M79605 Pain in left leg: Secondary | ICD-10-CM | POA: Diagnosis present

## 2020-11-10 DIAGNOSIS — M5442 Lumbago with sciatica, left side: Secondary | ICD-10-CM | POA: Diagnosis present

## 2020-11-10 DIAGNOSIS — M5137 Other intervertebral disc degeneration, lumbosacral region: Secondary | ICD-10-CM | POA: Insufficient documentation

## 2020-11-10 MED ORDER — LIDOCAINE HCL (PF) 2 % IJ SOLN
INTRAMUSCULAR | Status: AC
  Filled 2020-11-10: qty 10

## 2020-11-10 MED ORDER — MIDAZOLAM HCL 5 MG/5ML IJ SOLN
1.0000 mg | INTRAMUSCULAR | Status: DC | PRN
  Administered 2020-11-10: 2 mg via INTRAVENOUS

## 2020-11-10 MED ORDER — ROPIVACAINE HCL 2 MG/ML IJ SOLN
2.0000 mL | Freq: Once | INTRAMUSCULAR | Status: AC
  Administered 2020-11-10: 2 mL via EPIDURAL

## 2020-11-10 MED ORDER — TRIAMCINOLONE ACETONIDE 40 MG/ML IJ SUSP
INTRAMUSCULAR | Status: AC
  Filled 2020-11-10: qty 1

## 2020-11-10 MED ORDER — MIDAZOLAM HCL 5 MG/5ML IJ SOLN
INTRAMUSCULAR | Status: AC
  Filled 2020-11-10: qty 5

## 2020-11-10 MED ORDER — FENTANYL CITRATE (PF) 100 MCG/2ML IJ SOLN
INTRAMUSCULAR | Status: AC
  Filled 2020-11-10: qty 2

## 2020-11-10 MED ORDER — LACTATED RINGERS IV SOLN
1000.0000 mL | Freq: Once | INTRAVENOUS | Status: AC
  Administered 2020-11-10: 1000 mL via INTRAVENOUS

## 2020-11-10 MED ORDER — SODIUM CHLORIDE 0.9% FLUSH
2.0000 mL | Freq: Once | INTRAVENOUS | Status: AC
  Administered 2020-11-10: 2 mL

## 2020-11-10 MED ORDER — ROPIVACAINE HCL 2 MG/ML IJ SOLN
INTRAMUSCULAR | Status: AC
  Filled 2020-11-10: qty 10

## 2020-11-10 MED ORDER — SODIUM CHLORIDE (PF) 0.9 % IJ SOLN
INTRAMUSCULAR | Status: AC
  Filled 2020-11-10: qty 10

## 2020-11-10 MED ORDER — IOHEXOL 180 MG/ML  SOLN
10.0000 mL | Freq: Once | INTRAMUSCULAR | Status: AC
  Administered 2020-11-10: 10 mL via EPIDURAL

## 2020-11-10 MED ORDER — FENTANYL CITRATE (PF) 100 MCG/2ML IJ SOLN
25.0000 ug | INTRAMUSCULAR | Status: DC | PRN
  Administered 2020-11-10: 50 ug via INTRAVENOUS

## 2020-11-10 MED ORDER — LIDOCAINE HCL 2 % IJ SOLN
20.0000 mL | Freq: Once | INTRAMUSCULAR | Status: AC
  Administered 2020-11-10: 100 mg
  Filled 2020-11-10: qty 20

## 2020-11-10 MED ORDER — TRIAMCINOLONE ACETONIDE 40 MG/ML IJ SUSP
40.0000 mg | Freq: Once | INTRAMUSCULAR | Status: AC
  Administered 2020-11-10: 40 mg

## 2020-11-10 NOTE — Progress Notes (Signed)
Safety precautions to be maintained throughout the outpatient stay will include: orient to surroundings, keep bed in low position, maintain call bell within reach at all times, provide assistance with transfer out of bed and ambulation.  

## 2020-11-10 NOTE — Patient Instructions (Signed)

## 2020-11-10 NOTE — Progress Notes (Signed)
PROVIDER NOTE: Information contained herein reflects review and annotations entered in association with encounter. Interpretation of such information and data should be left to medically-trained personnel. Information provided to patient can be located elsewhere in the medical record under "Patient Instructions". Document created using STT-dictation technology, any transcriptional errors that may result from process are unintentional.    Patient: Suzanne Avila  Service Category: Procedure  Provider: Oswaldo Done, MD  DOB: October 26, 1945  DOS: 11/10/2020  Location: [facility]  MRN: [id number]  Setting: Ambulatory - outpatient  Referring Provider: Suella Grove*  Type: Established Patient  Specialty: Interventional Pain Management  PCP: Margaretann Loveless, PA-C   Primary Reason for Visit: Interventional Pain Management Treatment. CC: Back Pain  Procedure:          Anesthesia, Analgesia, Anxiolysis:  Type: Therapeutic Inter-Laminar Epidural Steroid Injection  #1  Region: Lumbar Level: L2-3 Level. Laterality: Left Paramedial  Type: Moderate (Conscious) Sedation combined with Local Anesthesia Indication(s): Analgesia and Anxiety Route: Intravenous (IV) IV Access: Secured Sedation: Meaningful verbal contact was maintained at all times during the procedure  Local Anesthetic: Lidocaine 1-2%  Position: Prone with head of the table was raised to facilitate breathing.   Indications: 1. Spinal stenosis of lumbar region with neurogenic claudication   2. 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 Chronic lower extremity pain (Primary Area of Pain) (Bilateral) (L>R)   4. DDD (degenerative disc disease), lumbosacral   5. 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 (year old), female patient, seen today for  interventional treatment. She  has a past surgical history that includes Back surgery; Eye surgery; and Carpal tunnel release (Left). Suzanne Avila has a current medication list which includes the following prescription(s): albuterol, aspirin, atorvastatin, breztri aerosphere, diltiazem, dorzolamide, hydrocodone-acetaminophen, [START ON 12/02/2020] hydrocodone-acetaminophen, [START ON 01/01/2021] hydrocodone-acetaminophen, latanoprost, pancrelipase (lip-prot-amyl), magnesium gluconate, mirtazapine, multi-vitamin, omeprazole, pregabalin, ramipril, ropinirole, spironolactone, torsemide, and vitamin b-12, and the following Facility-Administered Medications: fentanyl and midazolam. Her primarily concern today is the Back Pain  Initial Vital Signs:  Pulse/HCG Rate: 69ECG Heart Rate: 76 Temp: 97.9 F (36.6 C) Resp: 16 BP: (!) 158/77 SpO2: 100 %  BMI: Estimated body mass index is 19.19 kg/m as calculated from the following:   Height as of this encounter: 4\' 11"  (1.499 m).   Weight as of this encounter: 95 lb (43.1 kg).  Risk Assessment: Allergies: Reviewed. She is allergic to codeine, cyclobenzaprine, and penicillins.  Allergy Precautions: None required Coagulopathies: Reviewed. None identified.  Blood-thinner therapy: None at this time Active Infection(s): Reviewed. None identified. Suzanne Avila is afebrile  Site Confirmation: Ms. Nodal was asked to confirm the procedure and laterality before marking the site Procedure checklist: Completed Consent: Before the procedure and under the influence of no sedative(s), amnesic(s), or anxiolytics, the patient was informed of the treatment options, risks and possible complications. To fulfill our ethical and legal obligations, as recommended by the American Medical Association's Code of Ethics, I have informed the patient of my clinical impression; the nature and purpose of the treatment or procedure; the risks, benefits, and possible complications of the  intervention; the alternatives, including doing nothing; the risk(s) and benefit(s) of the alternative treatment(s) or procedure(s); and the risk(s) and benefit(s) of doing nothing. The patient was provided information about the general risks and possible  complications associated with the procedure. These may include, but are not limited to: failure to achieve desired goals, infection, bleeding, organ or nerve damage, allergic reactions, paralysis, and death. In addition, the patient was informed of those risks and complications associated to Spine-related procedures, such as failure to decrease pain; infection (i.e.: Meningitis, epidural or intraspinal abscess); bleeding (i.e.: epidural hematoma, subarachnoid hemorrhage, or any other type of intraspinal or peri-dural bleeding); organ or nerve damage (i.e.: Any type of peripheral nerve, nerve root, or spinal cord injury) with subsequent damage to sensory, motor, and/or autonomic systems, resulting in permanent pain, numbness, and/or weakness of one or several areas of the body; allergic reactions; (i.e.: anaphylactic reaction); and/or death. Furthermore, the patient was informed of those risks and complications associated with the medications. These include, but are not limited to: allergic reactions (i.e.: anaphylactic or anaphylactoid reaction(s)); adrenal axis suppression; blood sugar elevation that in diabetics may result in ketoacidosis or comma; water retention that in patients with history of congestive heart failure may result in shortness of breath, pulmonary edema, and decompensation with resultant heart failure; weight gain; swelling or edema; medication-induced neural toxicity; particulate matter embolism and blood vessel occlusion with resultant organ, and/or nervous system infarction; and/or aseptic necrosis of one or more joints. Finally, the patient was informed that Medicine is not an exact science; therefore, there is also the possibility of  unforeseen or unpredictable risks and/or possible complications that may result in a catastrophic outcome. The patient indicated having understood very clearly. We have given the patient no guarantees and we have made no promises. Enough time was given to the patient to ask questions, all of which were answered to the patient's satisfaction. Ms. Bucholz has indicated that she wanted to continue with the procedure. Attestation: I, the ordering provider, attest that I have discussed with the patient the benefits, risks, side-effects, alternatives, likelihood of achieving goals, and potential problems during recovery for the procedure that I have provided informed consent. Date  Time: 11/10/2020 11:26 AM  Pre-Procedure Preparation:  Monitoring: As per clinic protocol. Respiration, ETCO2, SpO2, BP, heart rate and rhythm monitor placed and checked for adequate function Safety Precautions: Patient was assessed for positional comfort and pressure points before starting the procedure. Time-out: I initiated and conducted the "Time-out" before starting the procedure, as per protocol. The patient was asked to participate by confirming the accuracy of the "Time Out" information. Verification of the correct person, site, and procedure were performed and confirmed by me, the nursing staff, and the patient. "Time-out" conducted as per Joint Commission's Universal Protocol (UP.01.01.01). Time: 1205  Description of Procedure:          Target Area: The interlaminar space, initially targeting the lower laminar border of the superior vertebral body. Approach: Paramedial approach. Area Prepped: Entire Posterior Lumbar Region DuraPrep (Iodine Povacrylex [0.7% available iodine] and Isopropyl Alcohol, 74% w/w) Safety Precautions: Aspiration looking for blood return was conducted prior to all injections. At no point did we inject any substances, as a needle was being advanced. No attempts were made at seeking any  paresthesias. Safe injection practices and needle disposal techniques used. Medications properly checked for expiration dates. SDV (single dose vial) medications used. Description of the Procedure: Protocol guidelines were followed. The procedure needle was introduced through the skin, ipsilateral to the reported pain, and advanced to the target area. Bone was contacted and the needle walked caudad, until the lamina was cleared. The epidural space was identified using "loss-of-resistance technique" with 2-3 ml of  PF-NaCl (0.9% NSS), in a 5cc LOR glass syringe.  Vitals:   11/10/20 1213 11/10/20 1223 11/10/20 1233 11/10/20 1243  BP: 133/66 (!) 87/53 (!) 137/94 124/80  Pulse:      Resp: 15 17 14 16   Temp:  98.1 F (36.7 C)  97.9 F (36.6 C)  SpO2: 98% 96% 97% 97%  Weight:      Height:        Start Time: 1205 hrs. End Time: 1213 hrs.  Materials:  Needle(s) Type: Epidural needle Gauge: 17G Length: 3.5-in Medication(s): Please see orders for medications and dosing details.  Imaging Guidance (Spinal):          Type of Imaging Technique: Fluoroscopy Guidance (Spinal) Indication(s): Assistance in needle guidance and placement for procedures requiring needle placement in or near specific anatomical locations not easily accessible without such assistance. Exposure Time: Please see nurses notes. Contrast: Before injecting any contrast, we confirmed that the patient did not have an allergy to iodine, shellfish, or radiological contrast. Once satisfactory needle placement was completed at the desired level, radiological contrast was injected. Contrast injected under live fluoroscopy. No contrast complications. See chart for type and volume of contrast used. Fluoroscopic Guidance: I was personally present during the use of fluoroscopy. "Tunnel Vision Technique" used to obtain the best possible view of the target area. Parallax error corrected before commencing the procedure.  "Direction-depth-direction" technique used to introduce the needle under continuous pulsed fluoroscopy. Once target was reached, antero-posterior, oblique, and lateral fluoroscopic projection used confirm needle placement in all planes. Images permanently stored in EMR. Interpretation: I personally interpreted the imaging intraoperatively. Adequate needle placement confirmed in multiple planes. Appropriate spread of contrast into desired area was observed. No evidence of afferent or efferent intravascular uptake. No intrathecal or subarachnoid spread observed. Permanent images saved into the patient's record.  Antibiotic Prophylaxis:   Anti-infectives (From admission, onward)   None     Indication(s): None identified  Post-operative Assessment:  Post-procedure Vital Signs:  Pulse/HCG Rate: 6971 Temp: 97.9 F (36.6 C) Resp: 16 BP: 124/80 SpO2: 97 %  EBL: None  Complications: No immediate post-treatment complications observed by team, or reported by patient.  Note: The patient tolerated the entire procedure well. A repeat set of vitals were taken after the procedure and the patient was kept under observation following institutional policy, for this type of procedure. Post-procedural neurological assessment was performed, showing return to baseline, prior to discharge. The patient was provided with post-procedure discharge instructions, including a section on how to identify potential problems. Should any problems arise concerning this procedure, the patient was given instructions to immediately contact , at any time, without hesitation. In any case, we plan to contact the patient by telephone for a follow-up status report regarding this interventional procedure.  Comments:  No additional relevant information.  Plan of Care  Orders:  Orders Placed This Encounter  Procedures  . Lumbar Epidural Injection    Scheduling Instructions:     Procedure: Interlaminar LESI L2-3     Laterality:  Left-sided     Sedation: Patient's choice     Timeframe:  Today    Order Specific Question:   Where will this procedure be performed?    Answer:   [facility]  . DG PA[facility] C-ARM 1-60 MIN NO REPORT    Intraoperative interpretation by procedural physician at [facility].    Standing Status:   Standing    Number of Occurrences:   1  Order Specific Question:   Reason for exam:    Answer:   Assistance in needle guidance and placement for procedures requiring needle placement in or near specific anatomical locations not easily accessible without such assistance.  . Informed Consent Details: Physician/Practitioner Attestation; Transcribe to consent form and obtain patient signature    Note: Always confirm laterality of pain with Ms. Claiborne Billingsallahan, before procedure. Transcribe to consent form and obtain patient signature.    Order Specific Question:   Physician/Practitioner attestation of informed consent for procedure/surgical case    Answer:   I, the physician/practitioner, attest that I have discussed with the patient the benefits, risks, side effects, alternatives, likelihood of achieving goals and potential problems during recovery for the procedure that I have provided informed consent.    Order Specific Question:   Procedure    Answer:   Lumbar epidural steroid injection under fluoroscopic guidance    Order Specific Question:   Physician/Practitioner performing the procedure    Answer:   Ailine Hefferan A. 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 Provide equipment / supplies at bedside    "Epidural Tray" (Disposable  single use) Catheter: NOT required    Standing Status:   Standing    Number of Occurrences:   1    Order Specific Question:   Specify    Answer:   Epidural Tray   Chronic Opioid Analgesic:  Hydrocodone/APAP 7.5/325, 1 tab PO q 6 hrs (30 mg/day of  hydrocodone) MME/day:30 mg/day.   Medications ordered for procedure: Meds ordered this encounter  Medications  . iohexol (OMNIPAQUE) 180 MG/ML injection 10 mL    Must be Myelogram-compatible. If not available, you may substitute with a water-soluble, non-ionic, hypoallergenic, myelogram-compatible radiological contrast medium.  Marland Kitchen. lidocaine (XYLOCAINE) 2 % (with pres) injection 400 mg  . lactated ringers infusion 1,000 mL  . midazolam (VERSED) 5 MG/5ML injection 1-2 mg    Make sure Flumazenil is available in the pyxis when using this medication. If oversedation occurs, administer 0.2 mg IV over 15 sec. If after 45 sec no response, administer 0.2 mg again over 1 min; may repeat at 1 min intervals; not to exceed 4 doses (1 mg)  . fentaNYL (SUBLIMAZE) injection 25-50 mcg    Make sure Narcan is available in the pyxis when using this medication. In the event of respiratory depression (RR< 8/min): Titrate NARCAN (naloxone) in increments of 0.1 to 0.2 mg IV at 2-3 minute intervals, until desired degree of reversal.  . sodium chloride flush (NS) 0.9 % injection 2 mL  . ropivacaine (PF) 2 mg/mL (0.2%) (NAROPIN) injection 2 mL  . triamcinolone acetonide (KENALOG-40) injection 40 mg   Medications administered: We administered iohexol, lidocaine, lactated ringers, midazolam, fentaNYL, sodium chloride flush, ropivacaine (PF) 2 mg/mL (0.2%), and triamcinolone acetonide.  See the medical record for exact dosing, route, and time of administration.  Follow-up plan:   Return in about 2 weeks (around 11/24/2020) for (F2F), (PP) Follow-up.       Considering:   Diagnostic bilateral IA hip joint injection  Possible bilateral hip joint RFA  Possible bilateral lumbar facet RFA  Possible left SI joint RFA  Diagnostic bilateral L4-L5 TFESI  Diagnostic caudal ESI  Diagnostic caudal epidurogram  Possible RACZ epidural lysis of adhesions  Diagnostic left L2-3 LESI  Possible bilateral Lumbar SCS trial implant     Palliative PRN treatment(s):  Diagnostic right lumbar facet block #2  Diagnostic left lumbar facet block #3  Diagnostic left SI joint block #3      Recent Visits Date Type Provider Dept  11/02/20 Office Visit Delano MetzNaveira, Caedyn Raygoza, MD [facility]  09/10/20 Telemedicine Delano MetzNaveira, Brylon Brenning, MD [facility]  08/25/20 Procedure visit Delano MetzNaveira, Pearlie Lafosse, MD [facility]  Showing recent visits within past 90 days and meeting all other requirements Today's Visits Date Type Provider Dept  11/10/20 Procedure visit Delano MetzNaveira, Nasif Bos, MD [facility]  Showing today's visits and meeting all other requirements Future Appointments Date Type Provider Dept  11/30/20 Appointment Delano MetzNaveira, Kayd Launer, MD [facility]  01/27/21 Appointment Delano MetzNaveira, Emiel Kielty, MD [facility]  Showing future appointments within next 90 days and meeting all other requirements  Disposition: Discharge home  Discharge (Date  Time): 11/10/2020; 1256 hrs.   Primary Care Physician: Margaretann LovelessBurnette, Jennifer M, PA-C [facility] Note by: Oswaldo DoneFrancisco A Sukhdeep Wieting, MD Date: 11/10/2020; Time: 12:59 PM  Disclaimer:  Medicine is not an Visual merchandiserexact science. The only guarantee in medicine is that nothing is guaranteed. It is important to note that the decision to proceed with this intervention was based on the information collected from the patient. The Data and conclusions were drawn from the patient's questionnaire, the interview, and the physical examination. Because the information was provided in large part by the patient, it cannot be guaranteed that it has not been purposely or unconsciously manipulated. Every effort has been made to obtain as much relevant data as possible for this evaluation. It is important to note that the conclusions that lead to this procedure are derived in large part from the available data. Always take into account that the  treatment will also be dependent on availability of resources and existing treatment guidelines, considered by other Pain Management Practitioners as being common knowledge and practice, at the time of the intervention. For Medico-Legal purposes, it is also important to point out that variation in procedural techniques and pharmacological choices are the acceptable norm. The indications, contraindications, technique, and results of the above procedure should only be interpreted and judged by a Board-Certified Interventional Pain Specialist with extensive familiarity and expertise in the same exact procedure and technique.

## 2020-11-11 ENCOUNTER — Telehealth: Payer: Self-pay

## 2020-11-11 ENCOUNTER — Ambulatory Visit (INDEPENDENT_AMBULATORY_CARE_PROVIDER_SITE_OTHER): Payer: Medicare Other | Admitting: Physician Assistant

## 2020-11-11 ENCOUNTER — Other Ambulatory Visit: Payer: Self-pay

## 2020-11-11 VITALS — BP 126/69 | HR 80 | Temp 98.5°F | Wt 94.0 lb

## 2020-11-11 DIAGNOSIS — M7989 Other specified soft tissue disorders: Secondary | ICD-10-CM

## 2020-11-11 DIAGNOSIS — R627 Adult failure to thrive: Secondary | ICD-10-CM

## 2020-11-11 MED ORDER — TORSEMIDE 20 MG PO TABS: 20.0000 mg | ORAL_TABLET | Freq: Every day | ORAL | 1 refills | Status: DC

## 2020-11-11 MED ORDER — MIRTAZAPINE 7.5 MG PO TABS
7.5000 mg | ORAL_TABLET | Freq: Every day | ORAL | 1 refills | Status: DC
Start: 2020-11-11 — End: 2021-04-02

## 2020-11-11 NOTE — Telephone Encounter (Signed)
Denies any needs at this time. Instructed to call if needed. Patient with understanding. 

## 2020-11-11 NOTE — Progress Notes (Unsigned)
Established patient visit   Patient: Suzanne Avila   DOB: November 26, 1945   75 y.o. Female  MRN: [id number] Visit Date: 11/11/2020  Today's healthcare provider: Margaretann Loveless, PA-C   Chief Complaint  Patient presents with  . Edema   Subjective    HPI   Follow up for Edema  The patient was last seen for this 2 weeks ago. Changes made at last visit include Torsemide 20mg  1-2 tablets a day.  She reports excellent compliance with treatment. She feels that condition is Improved. She is not having side effects.   -----------------------------------------------------------------------------------------    Patient Active Problem List   Diagnosis Date Noted  . 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  . Cavitary lesion of lung 09/15/2020  . AF (paroxysmal atrial fibrillation) (HCC) 09/15/2020  . Failure to thrive in adult 09/15/2020  . Severe protein-calorie malnutrition 09/17/2020: less than 60% of standard weight) (HCC) 09/15/2020  . Dysphagia 09/15/2020  . Hypotension 09/15/2020  . Abscess of right lung with pneumonia (HCC) 09/14/2020  . Chronic low back pain (Left) w/o sciatica 09/10/2020  . Pharmacologic therapy 05/05/2020  . Chronic diarrhea 05/04/2020  . DDD (degenerative disc disease), lumbosacral 11/19/2019  . Therapeutic opioid-induced constipation (OIC) 08/01/2019  . Chronic sacroiliac joint pain (Left) 05/01/2019  . Chronic groin pain (Left) 05/01/2019  . Spondylosis without myelopathy or radiculopathy, lumbosacral region 05/01/2019  . Other specified dorsopathies, sacral and sacrococcygeal region 05/01/2019  . Renal artery stenosis (HCC) 12/27/2017  . Pain in limb 12/22/2017  . Tobacco use disorder 12/22/2017  . Renal atrophy, right 12/22/2017  . Thoracic radiculitis 01/31/2017  . Chronic lower extremity pain (Primary Area of Pain) (Bilateral) (L>R) 04/25/2016  . Chronic  upper extremity pain (Third area of Pain) (Bilateral) (L>R) 04/25/2016  . Disturbance of skin sensation 04/20/2016  . Neurogenic pain 04/20/2016  . Chronic hip pain (Bilateral) (L>R) 11/11/2015  . Vitamin D deficiency 11/11/2015  . Edema 09/02/2015  . 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 Encounter for therapeutic drug level monitoring 08/14/2015  . Long term current use of opiate analgesic 08/14/2015  . Long term prescription opiate use 08/14/2015  . Uncomplicated opioid dependence (HCC) 08/14/2015  . Opiate use (30 MME/Day) 08/14/2015  . Chronic low back pain (2ry area of Pain) (Bilateral) (L>R) w/ sciatica (Bilateral) 08/14/2015  . Chronic pain syndrome 08/14/2015  . Alcoholic (HCC) 08/14/2015  . B12 deficiency 08/14/2015  . Narrowing of intervertebral disc space 08/14/2015  . Encounter for screening for diabetes mellitus 08/14/2015  . History of metabolic disorder 08/14/2015  . Fungal infection of toenail 08/14/2015  . Osteopenia 08/14/2015  . Compulsive tobacco user syndrome 08/14/2015  . Osteoarthritis of hip (Left) 08/14/2015  . Chronic hip pain (Left) 08/14/2015  . Chronic lumbar radicular pain (L5 dermatome) (Bilateral) (L>R) 08/14/2015  . Lumbar facet syndrome (Bilateral) (L>R) 08/14/2015  . Lumbar spondylosis 08/14/2015  . 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 Lumbar Postlaminectomy syndrome (L4-5) 08/14/2015  . Fibromyalgia 08/14/2015  . Chronic airway obstruction (HCC) 04/21/2015  . Allergic rhinitis 02/10/2010  . Atrial paroxysmal tachycardia (HCC) 07/06/2009  . CAFL (chronic airflow limitation) (HCC) 07/06/2009  . Acid reflux 07/06/2009  . Glaucoma 07/06/2009  . Essential hypertension 07/06/2009  . Hypercholesterolemia without hypertriglyceridemia 07/06/2009  . Absence of bladder continence 07/06/2009   Past Medical History:  Diagnosis Date  . Closed left arm fracture   .  COPD (chronic obstructive pulmonary disease) (HCC)    . Dehydration 09/15/2020  . Glaucoma   . Hyperlipidemia   . Hypertension   . Lung mass    Social History   Tobacco Use  . Smoking status: Current Every Day Smoker    Packs/day: 1.50    Years: 50.00    Pack years: 75.00    Types: Cigarettes  . Smokeless tobacco: Never Used  . Tobacco comment: 1PPD 09/22/2020   Vaping Use  . Vaping Use: Former  Substance Use Topics  . Alcohol use: Yes    Alcohol/week: 35.0 standard drinks    Types: 35 Cans of beer per week    Comment: 4-5 beers every day  . Drug use: No   Allergies  Allergen Reactions  . Codeine Itching  . Cyclobenzaprine Itching  . Penicillins     Itching without rash per patient.  Unable to say when it happened, other than "a long time ago"     Medications: Outpatient Medications Prior to Visit  Medication Sig  . albuterol (PROVENTIL) (2.5 MG/3ML) 0.083% nebulizer solution Take 3 mLs (2.5 mg total) by nebulization every 6 (six) hours as needed for wheezing or shortness of breath.  Marland Kitchen aspirin 325 MG tablet Take 1 tablet (325 mg total) by mouth daily.  Marland Kitchen atorvastatin (LIPITOR) 40 MG tablet Take 1 tablet (40 mg total) by mouth daily.  . Budeson-Glycopyrrol-Formoterol (BREZTRI AEROSPHERE) 160-9-4.8 MCG/ACT AERO Inhale 2 puffs into the lungs in the morning and at bedtime.  Marland Kitchen diltiazem (CARDIZEM) 120 MG tablet Take 1 tablet (120 mg total) by mouth daily.  . dorzolamide (TRUSOPT) 2 % ophthalmic solution Place 1 drop into both eyes 2 (two) times daily.  Marland Kitchen HYDROcodone-acetaminophen (NORCO) 7.5-325 MG tablet Take 1 tablet by mouth every 6 (six) hours as needed for severe pain. Must last 30 days  . [START ON 12/02/2020] HYDROcodone-acetaminophen (NORCO) 7.5-325 MG tablet Take 1 tablet by mouth every 6 (six) hours as needed for severe pain. Must last 30 days  . [START ON 01/01/2021] HYDROcodone-acetaminophen (NORCO) 7.5-325 MG tablet Take 1 tablet by mouth every 6 (six) hours as needed for severe pain. Must last 30 days  . latanoprost  (XALATAN) 0.005 % ophthalmic solution Place 1 drop into both eyes at bedtime.  . lipase/protease/amylase 24000-76000 units CPEP Take 1 capsule (24,000 Units total) by mouth 3 (three) times daily before meals.  . magnesium gluconate (MAGONATE) 500 MG tablet Take 500 mg by mouth daily.  . mirtazapine (REMERON) 7.5 MG tablet Take 1 tablet (7.5 mg total) by mouth at bedtime.  . Multiple Vitamin (MULTI-VITAMIN) tablet Take 1 tablet by mouth daily.   Marland Kitchen omeprazole (PRILOSEC) 40 MG capsule Take 1 capsule (40 mg total) by mouth daily.  . pregabalin (LYRICA) 150 MG capsule Take 1 capsule (150 mg total) by mouth every 8 (eight) hours.  . ramipril (ALTACE) 2.5 MG capsule Take 1 capsule (2.5 mg total) by mouth daily.  Marland Kitchen rOPINIRole (REQUIP) 2 MG tablet Take 1 tablet (2 mg total) by mouth 2 (two) times daily.  Marland Kitchen spironolactone (ALDACTONE) 25 MG tablet Take 1 tablet (25 mg total) by mouth daily.  Marland Kitchen torsemide (DEMADEX) 20 MG tablet Take 1-2 tablets (20-40 mg total) by mouth daily.  . vitamin B-12 (CYANOCOBALAMIN) 500 MCG tablet Take 1 tablet (500 mcg total) by mouth daily.   No facility-administered medications prior to visit.    Review of Systems  Constitutional: Negative.   Respiratory: Negative.   Cardiovascular: Positive for leg  swelling. Negative for chest pain and palpitations.  Gastrointestinal: Negative.   Neurological: Negative for dizziness, light-headedness and headaches.       Objective    BP 126/69 (BP Location: Left Arm, Patient Position: Sitting, Cuff Size: Normal)   Pulse 80   Temp 98.5 F (36.9 C) (Oral)   Wt 94 lb (42.6 kg)   BMI 18.99 kg/m     Physical Exam Vitals reviewed.  Constitutional:      General: She is not in acute distress.    Appearance: Normal appearance. She is well-developed and well-nourished. She is not ill-appearing or diaphoretic.  Neck:     Thyroid: No thyromegaly.     Vascular: No JVD.     Trachea: No tracheal deviation.  Cardiovascular:     Rate  and Rhythm: Normal rate and regular rhythm.     Heart sounds: Normal heart sounds. No murmur heard. No friction rub. No gallop.   Pulmonary:     Effort: Pulmonary effort is normal. No respiratory distress.     Breath sounds: Rhonchi present. No wheezing or rales.  Musculoskeletal:     Cervical back: Normal range of motion and neck supple.     Right lower leg: Edema (trace, close to baseline) present.     Left lower leg: Edema (trace, close to baseline) present.  Lymphadenopathy:     Cervical: No cervical adenopathy.  Neurological:     Mental Status: She is alert.      No results found for any visits on 11/11/20.  Assessment & Plan     1. Leg swelling Swelling is much improved and patient reports she is feeling well. Will continue torsemide as below but need to monitor kidney function closely as patient does have renal artery stenosis (followed by vascular) and do not want to compromise the kidneys. Will check labs as below and f/u pending results. F/U in 3 months. Call if acute issue arises in the meantime.  - torsemide (DEMADEX) 20 MG tablet; Take 1-2 tablets (20-40 mg total) by mouth daily.  Dispense: 180 tablet; Refill: 1 - Comprehensive Metabolic Panel (CMET)  2. FTT (failure to thrive) in adult Stable. Diagnosis pulled for medication refill. Continue current medical treatment plan. - mirtazapine (REMERON) 7.5 MG tablet; Take 1 tablet (7.5 mg total) by mouth at bedtime.  Dispense: 90 tablet; Refill: 1 - Comprehensive Metabolic Panel (CMET)   No follow-ups on file.      Delmer Islam, PA-C, have reviewed all documentation for this visit. The documentation on 11/12/20 for the exam, diagnosis, procedures, and orders are all accurate and complete.   Reine Just  [facility] [phone] (phone) [phone] (fax)  [facility]

## 2020-11-12 ENCOUNTER — Encounter: Payer: Self-pay | Admitting: Physician Assistant

## 2020-11-12 ENCOUNTER — Telehealth: Payer: Self-pay

## 2020-11-12 LAB — COMPREHENSIVE METABOLIC PANEL
ALT: 8 IU/L (ref 0–32)
AST: 15 IU/L (ref 0–40)
Albumin/Globulin Ratio: 1.7 (ref 1.2–2.2)
Albumin: 4.3 g/dL (ref 3.7–4.7)
Alkaline Phosphatase: 125 IU/L — ABNORMAL HIGH (ref 44–121)
BUN/Creatinine Ratio: 16 (ref 12–28)
BUN: 16 mg/dL (ref 8–27)
Bilirubin Total: 0.3 mg/dL (ref 0.0–1.2)
CO2: 26 mmol/L (ref 20–29)
Calcium: 9.8 mg/dL (ref 8.7–10.3)
Chloride: 93 mmol/L — ABNORMAL LOW (ref 96–106)
Creatinine, Ser: 1.02 mg/dL — ABNORMAL HIGH (ref 0.57–1.00)
GFR calc Af Amer: 62 mL/min/{1.73_m2} (ref >59)
GFR calc non Af Amer: 54 mL/min/{1.73_m2} — ABNORMAL LOW (ref >59)
Globulin, Total: 2.6 g/dL (ref 1.5–4.5)
Glucose: 87 mg/dL (ref 65–99)
Potassium: 3.9 mmol/L (ref 3.5–5.2)
Sodium: 134 mmol/L (ref 134–144)
Total Protein: 6.9 g/dL (ref 6.0–8.5)

## 2020-11-12 NOTE — Telephone Encounter (Signed)
Pt advised.   Thanks,   -Shantel Helwig  

## 2020-11-12 NOTE — Telephone Encounter (Signed)
-----   Message from Margaretann Loveless, PA-C sent at 11/12/2020  9:16 AM EST ----- Labs overall ok. We will recheck again in 3 months at follow up. Please schedule 3 month follow up if there is not one on file.

## 2020-11-18 ENCOUNTER — Encounter: Payer: Self-pay | Admitting: Pulmonary Disease

## 2020-11-18 ENCOUNTER — Other Ambulatory Visit: Payer: Self-pay

## 2020-11-18 ENCOUNTER — Ambulatory Visit (INDEPENDENT_AMBULATORY_CARE_PROVIDER_SITE_OTHER): Payer: Medicare Other | Admitting: Pulmonary Disease

## 2020-11-18 VITALS — BP 128/74 | HR 80 | Temp 96.9°F | Ht 59.0 in | Wt 102.0 lb

## 2020-11-18 DIAGNOSIS — J449 Chronic obstructive pulmonary disease, unspecified: Secondary | ICD-10-CM | POA: Diagnosis not present

## 2020-11-18 DIAGNOSIS — F17210 Nicotine dependence, cigarettes, uncomplicated: Secondary | ICD-10-CM | POA: Diagnosis not present

## 2020-11-18 DIAGNOSIS — J852 Abscess of lung without pneumonia: Secondary | ICD-10-CM | POA: Diagnosis not present

## 2020-11-18 MED ORDER — BREZTRI AEROSPHERE 160-9-4.8 MCG/ACT IN AERO: 2.0000 | INHALATION_SPRAY | Freq: Two times a day (BID) | RESPIRATORY_TRACT | 0 refills | Status: AC

## 2020-11-18 MED ORDER — BREZTRI AEROSPHERE 160-9-4.8 MCG/ACT IN AERO: 2.0000 | INHALATION_SPRAY | Freq: Two times a day (BID) | RESPIRATORY_TRACT | 3 refills | Status: DC

## 2020-11-18 MED ORDER — ALBUTEROL SULFATE HFA 108 (90 BASE) MCG/ACT IN AERS: 2.0000 | INHALATION_SPRAY | Freq: Four times a day (QID) | RESPIRATORY_TRACT | 6 refills | Status: AC | PRN

## 2020-11-18 NOTE — Patient Instructions (Addendum)
We will get CT scan of the chest in another 2 to 3 weeks  We will reschedule your breathing tests  We will see you in follow-up in 2 months time call sooner should any new problems arise

## 2020-11-18 NOTE — Progress Notes (Signed)
Subjective:    Patient ID: Suzanne Avila, female    DOB: 1945-11-30, 75 y.o.   MRN: [id number]  HPI Suzanne Avila is a 75 year old current smoker (1 PPD) who presents for follow-up on the issue of a right lung abscess/mass.  She was initially evaluated here on 04 September 2020 and subsequently had to be admitted to [facility] after a syncopal episode which was deemed to have been due to volume depletion and A. fib with RVR. She was admitted on 22 November through the 25 November.  She was noted to have significant dysphagia and had had decreased in her p.o. intake.  She was also noted to have severe thrush and was treated for thrush and potential Candida esophagitis.  She did very well with fluconazole therapy regaining her ability to swallow without difficulty.  She completed antibiotics for a right cavitary mass that appears to be a lung abscess.  She had had issues with aspiration prior to admission.   She recently had issues with lower extremity edema and had to have her diuretics switched to torsemide, these have been very effective.  She is using Breztri for management of her COPD and she feels that this is very effective.  She has stopped all alcohol intake.  Previously she was drinking close to a sixpack of beer a day.  Insert discharge from [facility] she has not noticed issues with dysphagia.  She has actually gained weight and is eating well.  Since CT chest was performed on 06 October 2020 and this showed decrease size of the cavitary area of the right upper lobe suggesting resolving pulmonary abscess.  She will need follow-up imaging.  She has not had any fevers, chills or sweats.  Cough has subsided.  No sputum production no hemoptysis.  Review of Systems A 10 point review of systems was performed and it is as noted above otherwise negative.  Patient Active Problem List   Diagnosis Date Noted  . 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 Avascular necrosis of femoral head  (Left) (HCC) 11/02/2020  . Cavitary lesion of lung 09/15/2020  . AF (paroxysmal atrial fibrillation) (HCC) 09/15/2020  . Failure to thrive in adult 09/15/2020  . Severe protein-calorie malnutrition [name]: less than 60% of standard weight) (HCC) 09/15/2020  . Dysphagia 09/15/2020  . Hypotension 09/15/2020  . Abscess of right lung with pneumonia (HCC) 09/14/2020  . Chronic low back pain (Left) w/o sciatica 09/10/2020  . Pharmacologic therapy 05/05/2020  . Chronic diarrhea 05/04/2020  . DDD (degenerative disc disease), lumbosacral 11/19/2019  . Therapeutic opioid-induced constipation (OIC) 08/01/2019  . Chronic sacroiliac joint pain (Left) 05/01/2019  . Chronic groin pain (Left) 05/01/2019  . Spondylosis without myelopathy or radiculopathy, lumbosacral region 05/01/2019  . Other specified dorsopathies, sacral and sacrococcygeal region 05/01/2019  . Renal artery stenosis (HCC) 12/27/2017  . Pain in limb 12/22/2017  . Tobacco use disorder 12/22/2017  . Renal atrophy, right 12/22/2017  . Thoracic radiculitis 01/31/2017  . Chronic lower extremity pain (Primary Area of Pain) (Bilateral) (L>R) 04/25/2016  . Chronic upper extremity pain (Third area of Pain) (Bilateral) (L>R) 04/25/2016  . Disturbance of skin sensation 04/20/2016  . Neurogenic pain 04/20/2016  . Chronic hip pain (Bilateral) (L>R) 11/11/2015  . Vitamin D deficiency 11/11/2015  . Edema 09/02/2015  . 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 Encounter for therapeutic drug level monitoring 08/14/2015  . Long term current use of opiate analgesic 08/14/2015  . Long term prescription opiate  use 08/14/2015  . Uncomplicated opioid dependence (HCC) 08/14/2015  . Opiate use (30 MME/Day) 08/14/2015  . Chronic low back pain (2ry area of Pain) (Bilateral) (L>R) w/ sciatica (Bilateral) 08/14/2015  . Chronic pain syndrome 08/14/2015  . Alcoholic (HCC) 08/14/2015  . B12 deficiency 08/14/2015  . Narrowing of intervertebral disc  space 08/14/2015  . Encounter for screening for diabetes mellitus 08/14/2015  . History of metabolic disorder 08/14/2015  . Fungal infection of toenail 08/14/2015  . Osteopenia 08/14/2015  . Compulsive tobacco user syndrome 08/14/2015  . Osteoarthritis of hip (Left) 08/14/2015  . Chronic hip pain (Left) 08/14/2015  . Chronic lumbar radicular pain (L5 dermatome) (Bilateral) (L>R) 08/14/2015  . Lumbar facet syndrome (Bilateral) (L>R) 08/14/2015  . Lumbar spondylosis 08/14/2015  . 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 Lumbar Postlaminectomy syndrome (L4-5) 08/14/2015  . Fibromyalgia 08/14/2015  . Chronic airway obstruction (HCC) 04/21/2015  . Allergic rhinitis 02/10/2010  . Atrial paroxysmal tachycardia (HCC) 07/06/2009  . CAFL (chronic airflow limitation) (HCC) 07/06/2009  . Acid reflux 07/06/2009  . Glaucoma 07/06/2009  . Essential hypertension 07/06/2009  . Hypercholesterolemia without hypertriglyceridemia 07/06/2009  . Absence of bladder continence 07/06/2009   Social History   Tobacco Use  . Smoking status: Current Every Day Smoker    Packs/day: 1.50    Years: 50.00    Pack years: 75.00    Types: Cigarettes  . Smokeless tobacco: Never Used  . Tobacco comment: 1PPD 11/18/2020  Substance Use Topics  . Alcohol use: Yes    Alcohol/week: 35.0 standard drinks    Types: 35 Cans of beer per week    Comment: sytates she quit 09/23/2020   Allergies  Allergen Reactions  . Codeine Itching  . Cyclobenzaprine Itching  . Penicillins     Itching without rash per patient.  Unable to say when it happened, other than "a long time ago"   Current Meds  Medication Sig  . albuterol (PROVENTIL) (2.5 MG/3ML) 0.083% nebulizer solution Take 3 mLs (2.5 mg total) by nebulization every 6 (six) hours as needed for wheezing or shortness of breath.  Marland Kitchen albuterol (VENTOLIN HFA) 108 (90 Base) MCG/ACT inhaler Inhale 2 puffs into the lungs every 6 (six) hours as needed for  wheezing or shortness of breath.  Marland Kitchen aspirin 325 MG tablet Take 1 tablet (325 mg total) by mouth daily.  Marland Kitchen atorvastatin (LIPITOR) 40 MG tablet Take 1 tablet (40 mg total) by mouth daily.  . Budeson-Glycopyrrol-Formoterol (BREZTRI AEROSPHERE) 160-9-4.8 MCG/ACT AERO Inhale 2 puffs into the lungs in the morning and at bedtime.  . Budeson-Glycopyrrol-Formoterol (BREZTRI AEROSPHERE) 160-9-4.8 MCG/ACT AERO Inhale 2 puffs into the lungs 2 (two) times daily for 1 day.  . diltiazem (CARDIZEM) 120 MG tablet Take 1 tablet (120 mg total) by mouth daily.  . dorzolamide (TRUSOPT) 2 % ophthalmic solution Place 1 drop into both eyes 2 (two) times daily.  Marland Kitchen HYDROcodone-acetaminophen (NORCO) 7.5-325 MG tablet Take 1 tablet by mouth every 6 (six) hours as needed for severe pain. Must last 30 days  . [START ON 12/02/2020] HYDROcodone-acetaminophen (NORCO) 7.5-325 MG tablet Take 1 tablet by mouth every 6 (six) hours as needed for severe pain. Must last 30 days  . [START ON 01/01/2021] HYDROcodone-acetaminophen (NORCO) 7.5-325 MG tablet Take 1 tablet by mouth every 6 (six) hours as needed for severe pain. Must last 30 days  . latanoprost (XALATAN) 0.005 % ophthalmic solution Place 1 drop into both eyes at bedtime.  . lipase/protease/amylase 24000-76000 units  CPEP Take 1 capsule (24,000 Units total) by mouth 3 (three) times daily before meals.  . magnesium gluconate (MAGONATE) 500 MG tablet Take 500 mg by mouth daily.  . mirtazapine (REMERON) 7.5 MG tablet Take 1 tablet (7.5 mg total) by mouth at bedtime.  . Multiple Vitamin (MULTI-VITAMIN) tablet Take 1 tablet by mouth daily.   Marland Kitchen omeprazole (PRILOSEC) 40 MG capsule Take 1 capsule (40 mg total) by mouth daily.  . pregabalin (LYRICA) 150 MG capsule Take 1 capsule (150 mg total) by mouth every 8 (eight) hours.  . ramipril (ALTACE) 2.5 MG capsule Take 1 capsule (2.5 mg total) by mouth daily.  Marland Kitchen rOPINIRole (REQUIP) 2 MG tablet Take 1 tablet (2 mg total) by mouth 2 (two) times  daily.  Marland Kitchen spironolactone (ALDACTONE) 25 MG tablet Take 1 tablet (25 mg total) by mouth daily.  Marland Kitchen torsemide (DEMADEX) 20 MG tablet Take 1-2 tablets (20-40 mg total) by mouth daily.  . vitamin B-12 (CYANOCOBALAMIN) 500 MCG tablet Take 1 tablet (500 mcg total) by mouth daily.  . [DISCONTINUED] Budeson-Glycopyrrol-Formoterol (BREZTRI AEROSPHERE) 160-9-4.8 MCG/ACT AERO Inhale 2 puffs into the lungs in the morning and at bedtime.       Objective:   Physical Exam BP 128/74 (BP Location: Left Arm, Cuff Size: Normal)   Pulse 80   Temp (!) 96.9 F (36.1 C) (Temporal)   Ht 4\' 11"  (1.499 m)   Wt 102 lb (46.3 kg)   SpO2 97%   BMI 20.60 kg/m  GENERAL: Small statured woman, no acute distress.   Presents in transport chair. HEAD: Normocephalic, atraumatic.  EYES: Pupils equal, round, reactive to light. No scleral icterus.  MOUTH: Nose/mouth/throat not examined due to masking requirements for COVID 19. NECK: Supple. No thyromegaly. Trachea midline. No JVD. No adenopathy. PULMONARY: Distant breath sounds bilaterally. No adventitious sounds noted today.   CARDIOVASCULAR: S1 and S2. Regular rate and rhythm. No rubs, murmurs or gallops heard. ABDOMEN: Benign. 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  NEUROLOGIC: No overt focal deficit. Speech is fluent SKIN: Intact,warm,dry.  No cellulitis changes in the lower extremities. PSYCH: Mood and behavior normal.  Representative slices from CT performed 06 October 2020, independently reviewed:      Assessment & Plan:     ICD-10-CM   1. Abscess of upper lobe of right lung without pneumonia (HCC)  J85.2 CT CHEST WO CONTRAST   Appears to be resolving Repeat CT chest and follow to resolution   2. COPD mixed type [facility])  J44.9 Pulmonary Function Test [facility] Only   Continue Breztri Continue as needed albuterol  3. Tobacco  dependence due to cigarettes  F17.210    Patient counseled regards to discontinuation of smoking Total counseling time 3 to 5 minutes Patient not inclined to quit yet   Orders Placed This Encounter  Procedures  . CT CHEST WO CONTRAST    Standing Status:   Future    Standing Expiration Date:   11/18/2021    Scheduling Instructions:     2-3wk    Order Specific Question:   Preferred imaging location?    Answer:   [facility]  . Pulmonary Function Test [facility] Only    Standing Status:   Future    Standing Expiration Date:   11/18/2021    Order Specific Question:   Full PFT: includes the following: basic spirometry, spirometry pre & post bronchodilator, diffusion  capacity (DLCO), lung volumes    Answer:   Full PFT   Discussion:  The patient appears to be improving steadily from her initial issues noted in November 2021.  She has a cavitary right upper lobe lesion that appears to be a resolving abscess.  This will need to be followed with CT to resolution.  Cannot exclude underlying malignancy but it is encouraging that she is gaining weight and overall clinically appears improved.  She has significant COPD by clinical impression, will obtain PFTs to further evaluate this.  She is doing well with Markus Daft and this will be continued.  She has been advised to discontinue use of cigarettes but she is not willing to commit as of yet.  We will see her in follow-up in 2 months time, she is to contact us prior to that time should any new difficulties arise.  Gailen Shelter, MD [facility] [facility]   *This note was dictated using voice recognition software/Dragon.  Despite best efforts to proofread, errors can occur which can change the meaning.  Any change was purely unintentional.

## 2020-11-29 NOTE — Progress Notes (Deleted)
No-show °

## 2020-11-30 ENCOUNTER — Ambulatory Visit: Payer: Medicare Other | Attending: Pulmonary Disease

## 2020-11-30 ENCOUNTER — Encounter: Payer: Medicare Other | Admitting: Pain Medicine

## 2020-11-30 DIAGNOSIS — G8929 Other chronic pain: Secondary | ICD-10-CM

## 2020-11-30 DIAGNOSIS — F1120 Opioid dependence, uncomplicated: Secondary | ICD-10-CM

## 2020-11-30 DIAGNOSIS — M961 Postlaminectomy syndrome, not elsewhere classified: Secondary | ICD-10-CM

## 2020-11-30 DIAGNOSIS — Z79899 Other long term (current) drug therapy: Secondary | ICD-10-CM

## 2020-11-30 DIAGNOSIS — G894 Chronic pain syndrome: Secondary | ICD-10-CM

## 2020-11-30 DIAGNOSIS — M47816 Spondylosis without myelopathy or radiculopathy, lumbar region: Secondary | ICD-10-CM

## 2020-11-30 DIAGNOSIS — M87051 Idiopathic aseptic necrosis of right femur: Secondary | ICD-10-CM

## 2020-12-04 ENCOUNTER — Telehealth: Payer: Self-pay

## 2020-12-04 NOTE — Telephone Encounter (Signed)
Lm x1 for patient.  

## 2020-12-04 NOTE — Telephone Encounter (Signed)
ATC patient for reminder of covid prior to PFT. Received busy dial x3.   12/07/2020 at medication art building between 8-1.

## 2020-12-04 NOTE — Telephone Encounter (Signed)
PT returning missed call. Reminded pt of date/time of covid test/pft.//TM

## 2020-12-04 NOTE — Telephone Encounter (Signed)
Noted.  Will close encounter.  

## 2020-12-07 ENCOUNTER — Other Ambulatory Visit
Admission: RE | Admit: 2020-12-07 | Discharge: 2020-12-07 | Disposition: A | Discharge status: Home / Self Care | Payer: Medicare Other | Source: Ambulatory Visit | Attending: Pulmonary Disease | Admitting: Pulmonary Disease

## 2020-12-07 ENCOUNTER — Other Ambulatory Visit: Payer: Self-pay

## 2020-12-07 DIAGNOSIS — Z20822 Contact with and (suspected) exposure to covid-19: Secondary | ICD-10-CM | POA: Insufficient documentation

## 2020-12-07 DIAGNOSIS — Z01812 Encounter for preprocedural laboratory examination: Secondary | ICD-10-CM | POA: Insufficient documentation

## 2020-12-08 ENCOUNTER — Ambulatory Visit: Payer: Medicare Other | Attending: Pulmonary Disease

## 2020-12-08 ENCOUNTER — Telehealth: Payer: Self-pay | Admitting: Pulmonary Disease

## 2020-12-08 DIAGNOSIS — Z79899 Other long term (current) drug therapy: Secondary | ICD-10-CM | POA: Insufficient documentation

## 2020-12-08 DIAGNOSIS — F17210 Nicotine dependence, cigarettes, uncomplicated: Secondary | ICD-10-CM | POA: Diagnosis not present

## 2020-12-08 DIAGNOSIS — J449 Chronic obstructive pulmonary disease, unspecified: Secondary | ICD-10-CM

## 2020-12-08 LAB — PULMONARY FUNCTION TEST ARMC ONLY
DL/VA % pred: 67 %
DL/VA: 2.89 ml/min/mmHg/L
DLCO unc % pred: 60 %
DLCO unc: 9.74 ml/min/mmHg
FEF 25-75 Post: 0.68 L/sec
FEF 25-75 Pre: 0.85 L/sec
FEF2575-%Change-Post: -19 %
FEF2575-%Pred-Post: 49 %
FEF2575-%Pred-Pre: 61 %
FEV1-%Change-Post: -8 %
FEV1-%Pred-Post: 61 %
FEV1-%Pred-Pre: 66 %
FEV1-Post: 1.01 L
FEV1-Pre: 1.1 L
FEV1FVC-%Change-Post: -2 %
FEV1FVC-%Pred-Pre: 99 %
FEV6-%Change-Post: -5 %
FEV6-%Pred-Post: 66 %
FEV6-%Pred-Pre: 69 %
FEV6-Post: 1.39 L
FEV6-Pre: 1.47 L
FEV6FVC-%Pred-Post: 105 %
FEV6FVC-%Pred-Pre: 105 %
FVC-%Change-Post: -5 %
FVC-%Pred-Post: 62 %
FVC-%Pred-Pre: 65 %
FVC-Post: 1.39 L
Post FEV1/FVC ratio: 73 %
Post FEV6/FVC ratio: 100 %
Pre FEV1/FVC ratio: 75 %
Pre FEV6/FVC Ratio: 100 %
RV % pred: 77 %
RV: 1.58 L
TLC % pred: 82 %
TLC: 3.54 L

## 2020-12-08 LAB — SARS CORONAVIRUS 2 (TAT 6-24 HRS): SARS Coronavirus 2: NEGATIVE

## 2020-12-08 MED ORDER — ALBUTEROL SULFATE (2.5 MG/3ML) 0.083% IN NEBU
2.5000 mg | INHALATION_SOLUTION | Freq: Once | RESPIRATORY_TRACT | Status: AC
  Administered 2020-12-08: 2.5 mg via RESPIRATORY_TRACT
  Filled 2020-12-08: qty 3

## 2020-12-08 NOTE — Telephone Encounter (Signed)
I have spoke with Suzanne Avila and her CT chest has been rescheduled on 12/22/2020 @ 1:00pm at [facility] and she is aware of the appt

## 2020-12-17 ENCOUNTER — Ambulatory Visit (INDEPENDENT_AMBULATORY_CARE_PROVIDER_SITE_OTHER): Payer: Medicare Other | Admitting: Physician Assistant

## 2020-12-17 ENCOUNTER — Other Ambulatory Visit: Payer: Self-pay

## 2020-12-17 ENCOUNTER — Encounter: Payer: Self-pay | Admitting: Physician Assistant

## 2020-12-17 VITALS — BP 165/81 | HR 75 | Temp 97.0°F | Resp 16 | Wt 104.0 lb

## 2020-12-17 DIAGNOSIS — M7731 Calcaneal spur, right foot: Secondary | ICD-10-CM | POA: Diagnosis not present

## 2020-12-17 DIAGNOSIS — M7989 Other specified soft tissue disorders: Secondary | ICD-10-CM | POA: Diagnosis not present

## 2020-12-17 DIAGNOSIS — L03115 Cellulitis of right lower limb: Secondary | ICD-10-CM

## 2020-12-17 MED ORDER — CEPHALEXIN 500 MG PO CAPS: 500.0000 mg | ORAL_CAPSULE | Freq: Two times a day (BID) | ORAL | 0 refills | Status: DC

## 2020-12-17 MED ORDER — TORSEMIDE 40 MG PO TABS: 40.0000 mg | ORAL_TABLET | Freq: Two times a day (BID) | ORAL | 1 refills | Status: DC

## 2020-12-17 NOTE — Progress Notes (Signed)
Established patient visit   Patient: Suzanne Avila   DOB: February 09, 1946   75 y.o. Female  MRN: [id number] Visit Date: 12/17/2020  Today's healthcare provider: Margaretann Loveless, PA-C    Chief Complaint  Patient presents with   Leg Swelling   Subjective    HPI  Leg swelling: Patient complains of swelling in both legs for the past 3 weeks. Associated symptoms include leg pain and right heel pain. She has tried elevating legs with no improvement.  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   Cavitary lesion of lung 09/15/2020   AF (paroxysmal atrial fibrillation) (HCC) 09/15/2020   Failure to thrive in adult 09/15/2020   Severe protein-calorie malnutrition [name]: less than 60% of standard weight) (HCC) 09/15/2020   Dysphagia 09/15/2020   Hypotension 09/15/2020   Abscess of right lung with pneumonia (HCC) 09/14/2020   Chronic low back pain (Left) w/o sciatica 09/10/2020   Pharmacologic therapy 05/05/2020   Chronic diarrhea 05/04/2020   DDD (degenerative disc disease), lumbosacral 11/19/2019   Therapeutic opioid-induced constipation (OIC) 08/01/2019   Chronic sacroiliac joint pain (Left) 05/01/2019   Chronic groin pain (Left) 05/01/2019   Spondylosis without myelopathy or radiculopathy, lumbosacral region 05/01/2019   Other specified dorsopathies, sacral and sacrococcygeal region 05/01/2019   Renal artery stenosis (HCC) 12/27/2017   Pain in limb 12/22/2017   Tobacco use disorder 12/22/2017   Renal atrophy, right 12/22/2017   Thoracic radiculitis 01/31/2017   Chronic lower extremity pain (Primary Area of Pain) (Bilateral) (L>R) 04/25/2016   Chronic upper extremity pain (Third area of Pain) (Bilateral) (L>R) 04/25/2016   Disturbance of skin sensation 04/20/2016   Neurogenic pain 04/20/2016   Chronic hip pain  (Bilateral) (L>R) 11/11/2015   Vitamin D deficiency 11/11/2015   Edema 09/02/2015   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   Encounter for therapeutic drug level monitoring 08/14/2015   Long term current use of opiate analgesic 08/14/2015   Long term prescription opiate use 08/14/2015   Uncomplicated opioid dependence (HCC) 08/14/2015   Opiate use (30 MME/Day) 08/14/2015   Chronic low back pain (2ry area of Pain) (Bilateral) (L>R) w/ sciatica (Bilateral) 08/14/2015   Chronic pain syndrome 08/14/2015   Alcoholic (HCC) 08/14/2015   B12 deficiency 08/14/2015   Narrowing of intervertebral disc space 08/14/2015   Encounter for screening for diabetes mellitus 08/14/2015   History of metabolic disorder 08/14/2015   Fungal infection of toenail 08/14/2015   Osteopenia 08/14/2015   Compulsive tobacco user syndrome 08/14/2015   Osteoarthritis of hip (Left) 08/14/2015   Chronic hip pain (Left) 08/14/2015   Chronic lumbar radicular pain (L5 dermatome) (Bilateral) (L>R) 08/14/2015   Lumbar facet syndrome (Bilateral) (L>R) 08/14/2015   Lumbar spondylosis 08/14/2015   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) 08/14/2015   Lumbar Postlaminectomy syndrome (L4-5) 08/14/2015   Fibromyalgia 08/14/2015   Chronic airway obstruction (HCC) 04/21/2015   Allergic rhinitis 02/10/2010   Atrial paroxysmal tachycardia (HCC) 07/06/2009   CAFL (chronic airflow limitation) (HCC) 07/06/2009   Acid reflux 07/06/2009   Glaucoma 07/06/2009   Essential hypertension 07/06/2009   Hypercholesterolemia without hypertriglyceridemia 07/06/2009   Absence of bladder continence 07/06/2009   Past Medical History:  Diagnosis Date   Closed left arm fracture    COPD (chronic obstructive pulmonary disease) (HCC)    Dehydration 09/15/2020   Glaucoma  Hyperlipidemia    Hypertension    Lung mass        Medications: Outpatient Medications Prior to Visit   Medication Sig   albuterol (PROVENTIL) (2.5 MG/3ML) 0.083% nebulizer solution Take 3 mLs (2.5 mg total) by nebulization every 6 (six) hours as needed for wheezing or shortness of breath.   albuterol (VENTOLIN HFA) 108 (90 Base) MCG/ACT inhaler Inhale 2 puffs into the lungs every 6 (six) hours as needed for wheezing or shortness of breath.   aspirin 325 MG tablet Take 1 tablet (325 mg total) by mouth daily.   atorvastatin (LIPITOR) 40 MG tablet Take 1 tablet (40 mg total) by mouth daily.   Budeson-Glycopyrrol-Formoterol (BREZTRI AEROSPHERE) 160-9-4.8 MCG/ACT AERO Inhale 2 puffs into the lungs in the morning and at bedtime.   diltiazem (CARDIZEM) 120 MG tablet Take 1 tablet (120 mg total) by mouth daily.   dorzolamide (TRUSOPT) 2 % ophthalmic solution Place 1 drop into both eyes 2 (two) times daily.   HYDROcodone-acetaminophen (NORCO) 7.5-325 MG tablet Take 1 tablet by mouth every 6 (six) hours as needed for severe pain. Must last 30 days   [START ON 01/01/2021] HYDROcodone-acetaminophen (NORCO) 7.5-325 MG tablet Take 1 tablet by mouth every 6 (six) hours as needed for severe pain. Must last 30 days   latanoprost (XALATAN) 0.005 % ophthalmic solution Place 1 drop into both eyes at bedtime.   lipase/protease/amylase 24000-76000 units CPEP Take 1 capsule (24,000 Units total) by mouth 3 (three) times daily before meals.   magnesium gluconate (MAGONATE) 500 MG tablet Take 500 mg by mouth daily.   mirtazapine (REMERON) 7.5 MG tablet Take 1 tablet (7.5 mg total) by mouth at bedtime.   Multiple Vitamin (MULTI-VITAMIN) tablet Take 1 tablet by mouth daily.    omeprazole (PRILOSEC) 40 MG capsule Take 1 capsule (40 mg total) by mouth daily.   pregabalin (LYRICA) 150 MG capsule Take 1 capsule (150 mg total) by mouth every 8 (eight) hours.   ramipril (ALTACE) 2.5 MG capsule Take 1 capsule (2.5 mg total) by mouth daily.   rOPINIRole (REQUIP) 2 MG tablet Take 1 tablet (2 mg total) by mouth 2  (two) times daily.   spironolactone (ALDACTONE) 25 MG tablet Take 1 tablet (25 mg total) by mouth daily.   torsemide (DEMADEX) 20 MG tablet Take 1-2 tablets (20-40 mg total) by mouth daily.   vitamin B-12 (CYANOCOBALAMIN) 500 MCG tablet Take 1 tablet (500 mcg total) by mouth daily.   HYDROcodone-acetaminophen (NORCO) 7.5-325 MG tablet Take 1 tablet by mouth every 6 (six) hours as needed for severe pain. Must last 30 days   No facility-administered medications prior to visit.    Review of Systems  Constitutional: Negative for appetite change, chills, fatigue and fever.  Respiratory: Negative for chest tightness and shortness of breath.   Cardiovascular: Positive for leg swelling. Negative for chest pain and palpitations.  Gastrointestinal: Negative for abdominal pain, nausea and vomiting.  Musculoskeletal: Positive for arthralgias (right heel pain), gait problem and myalgias (leg pain).  Neurological: Negative for dizziness and weakness.    Last CBC Lab Results  Component Value Date   WBC 12.5 (H) 09/15/2020   HGB 9.9 (L) 09/15/2020   HCT 27.5 (L) 09/15/2020   MCV 90.2 09/15/2020   MCH 32.5 09/15/2020   RDW 13.1 09/15/2020   PLT 321 09/15/2020   Last metabolic panel Lab Results  Component Value Date   GLUCOSE 87 11/11/2020   NA 134 11/11/2020   K 3.9 11/11/2020  CL 93 (L) 11/11/2020   CO2 26 11/11/2020   BUN 16 11/11/2020   CREATININE 1.02 (H) 11/11/2020   GFRNONAA 54 (L) 11/11/2020   GFRAA 62 11/11/2020   CALCIUM 9.8 11/11/2020   PHOS 2.5 09/17/2020   PROT 6.9 11/11/2020   ALBUMIN 4.3 11/11/2020   LABGLOB 2.6 11/11/2020   AGRATIO 1.7 11/11/2020   BILITOT 0.3 11/11/2020   ALKPHOS 125 (H) 11/11/2020   AST 15 11/11/2020   ALT 8 11/11/2020   ANIONGAP 7 09/17/2020       Objective    BP (!) 165/81 (BP Location: Right Arm, Patient Position: Sitting, Cuff Size: Normal)    Pulse 75    Temp (!) 97 F (36.1 C) (Temporal)    Resp 16    Wt 104 lb (47.2 kg)    BMI  21.01 kg/m  BP Readings from Last 3 Encounters:  12/17/20 (!) 165/81  11/18/20 128/74  11/11/20 126/69   Wt Readings from Last 3 Encounters:  12/17/20 104 lb (47.2 kg)  11/18/20 102 lb (46.3 kg)  11/11/20 94 lb (42.6 kg)       Physical Exam Vitals reviewed.  Constitutional:      General: She is not in acute distress.    Appearance: Normal appearance. She is well-developed and well-nourished. She is not diaphoretic.  Neck:     Thyroid: No thyromegaly.     Vascular: No JVD.     Trachea: No tracheal deviation.  Cardiovascular:     Rate and Rhythm: Normal rate and regular rhythm.     Heart sounds: Normal heart sounds. No murmur heard. No friction rub. No gallop.   Pulmonary:     Effort: Pulmonary effort is normal. No respiratory distress.     Breath sounds: Normal breath sounds. No wheezing or rales.  Musculoskeletal:     Cervical back: Normal range of motion and neck supple.     Right lower leg: Edema (2+ pitting edema) present.     Left lower leg: Edema (1-2+ pitting edema) present.     Right foot: Swelling and bony tenderness (over plantar surface of calcaneous) present. No prominent metatarsal heads.     Left foot: Swelling present.  Lymphadenopathy:     Cervical: No cervical adenopathy.  Skin:    General: Skin is warm and dry.     Findings: Erythema present. No rash.       Neurological:     Mental Status: She is alert.      No results found for any visits on 12/17/20.  Assessment & Plan     1. Leg swelling Worsening leg swelling. Advised to wear compression stockings, elevate legs. Increase torsemide to 40mg  BID. F/U in 2-3 weeks.  - Torsemide 40 MG TABS; Take 40 mg by mouth in the morning and at bedtime.  Dispense: 60 tablet; Refill: 1  2. Cellulitis of right lower extremity Noted cellulitis of the right lower leg secondary to swelling. Keflex given as below. F/U in 2-3 weeks.  - cephALEXin (KEFLEX) 500 MG capsule; Take 1 capsule (500 mg total) by mouth 2  (two) times daily.  Dispense: 20 capsule; Refill: 0  3. Heel spur, right Point tenderness over the plantar surface of the heel, unable to put pressure on the heel with walking. Will consider getting imaging and working up at follow up visit if cellulitis and swelling have improved.    No follow-ups on file.      , PA-C, have reviewed all  documentation for this visit. The documentation on 12/22/20 for the exam, diagnosis, procedures, and orders are all accurate and complete.   Reine Just  [facility] [phone] (phone) [phone] (fax)  [facility]

## 2020-12-22 ENCOUNTER — Ambulatory Visit
Admission: RE | Admit: 2020-12-22 | Discharge: 2020-12-22 | Disposition: A | Discharge status: Home / Self Care | Payer: Medicare Other | Source: Ambulatory Visit | Attending: Pulmonary Disease | Admitting: Pulmonary Disease

## 2020-12-22 ENCOUNTER — Other Ambulatory Visit: Payer: Self-pay

## 2020-12-22 ENCOUNTER — Encounter: Payer: Self-pay | Admitting: Physician Assistant

## 2020-12-22 DIAGNOSIS — J852 Abscess of lung without pneumonia: Secondary | ICD-10-CM

## 2021-01-20 ENCOUNTER — Other Ambulatory Visit: Payer: Self-pay

## 2021-01-20 ENCOUNTER — Ambulatory Visit (INDEPENDENT_AMBULATORY_CARE_PROVIDER_SITE_OTHER): Payer: Medicare Other | Admitting: Physician Assistant

## 2021-01-20 VITALS — BP 175/90 | HR 77 | Temp 99.0°F | Wt 93.4 lb

## 2021-01-20 DIAGNOSIS — J449 Chronic obstructive pulmonary disease, unspecified: Secondary | ICD-10-CM | POA: Diagnosis not present

## 2021-01-20 DIAGNOSIS — E43 Unspecified severe protein-calorie malnutrition: Secondary | ICD-10-CM

## 2021-01-20 DIAGNOSIS — R2241 Localized swelling, mass and lump, right lower limb: Secondary | ICD-10-CM | POA: Diagnosis not present

## 2021-01-20 DIAGNOSIS — I10 Essential (primary) hypertension: Secondary | ICD-10-CM

## 2021-01-20 DIAGNOSIS — I701 Atherosclerosis of renal artery: Secondary | ICD-10-CM

## 2021-01-20 DIAGNOSIS — R252 Cramp and spasm: Secondary | ICD-10-CM

## 2021-01-20 MED ORDER — TIZANIDINE HCL 2 MG PO CAPS: 2.0000 mg | ORAL_CAPSULE | Freq: Three times a day (TID) | ORAL | 0 refills | Status: DC | PRN

## 2021-01-20 MED ORDER — TIZANIDINE HCL 2 MG PO CAPS: 2.0000 mg | ORAL_CAPSULE | Freq: Three times a day (TID) | ORAL | 1 refills | Status: DC | PRN

## 2021-01-20 NOTE — Progress Notes (Signed)
Established patient visit   Patient: Suzanne Avila   DOB: Jul 28, 1946   75 y.o. Female  MRN: [id number] Visit Date: 01/20/2021  Today's healthcare provider: Mar Daring, PA-C   Chief Complaint  Patient presents with  . Hypertension  . Hyperlipidemia  I,Trayvond Viets M Raychel Dowler,acting as a Education administrator for [employer], PA-C.,have documented all relevant documentation on the behalf of Mar Daring, PA-C,as directed by  Mar Daring, PA-C while in the presence of Mar Daring, [place].  Subjective    HPI  Hypertension, follow-up  BP Readings from Last 3 Encounters:  01/27/21 (!) 115/50  01/20/21 (!) 175/90  12/17/20 (!) 165/81   Wt Readings from Last 3 Encounters:  01/27/21 93 lb (42.2 kg)  01/20/21 93 lb 6.4 oz (42.4 kg)  12/17/20 104 lb (47.2 kg)     She was last seen for hypertension 3 months ago.  BP at that visit was 114/67. Management since that visit includes continue current medication.  She reports good compliance with treatment. She is not having side effects.  She is following a Regular diet. She is not exercising. She does smoke.  Use of agents associated with hypertension: none.   Outside blood pressures are not being checked. Symptoms: No chest pain No chest pressure  No palpitations No syncope  No dyspnea No orthopnea  No paroxysmal nocturnal dyspnea Yes lower extremity edema   Pertinent labs: Lab Results  Component Value Date   CHOL 154 02/03/2020   HDL 70 02/03/2020   LDLCALC 64 02/03/2020   TRIG 117 02/03/2020   CHOLHDL 2.6 01/09/2019   Lab Results  Component Value Date   NA 139 01/20/2021   K 4.5 01/20/2021   CREATININE 1.03 (H) 01/20/2021   GFRNONAA 54 (L) 11/11/2020   GFRAA 62 11/11/2020   GLUCOSE 82 01/20/2021     The 10-year ASCVD risk score Mikey Bussing DC Jr., et al., 2013) is: 24%   --------------------------------------------------------------------------------------------------- Lipid/Cholesterol,  Follow-up  Last lipid panel Other pertinent labs  Lab Results  Component Value Date   CHOL 154 02/03/2020   HDL 70 02/03/2020   LDLCALC 64 02/03/2020   TRIG 117 02/03/2020   CHOLHDL 2.6 01/09/2019   Lab Results  Component Value Date   ALT 8 01/20/2021   AST 16 01/20/2021   PLT 397 01/20/2021   TSH 1.110 06/14/2016     She was last seen for this 3 months ago.  Management since that visit includes continue medication.  She reports good compliance with treatment. She is not having side effects.   Symptoms: No chest pain No chest pressure/discomfort  No dyspnea Yes lower extremity edema  No numbness or tingling of extremity No orthopnea  No palpitations No paroxysmal nocturnal dyspnea  No speech difficulty No syncope   Current diet: well balanced Current exercise: housecleaning and no regular exercise  The 10-year ASCVD risk score Mikey Bussing DC Jr., et al., 2013) is: 24%  ---------------------------------------------------------------------------------------------------   Patient Active Problem List   Diagnosis Date Noted  . Chronic use of opiate for therapeutic purpose 01/26/2021  . Avascular necrosis of femoral head (Right) ([place]) 11/02/2020  . Avascular necrosis of femoral head (Left) ([place]) 11/02/2020  . Cavitary lesion of lung 09/15/2020  . AF (paroxysmal atrial fibrillation) ([place]) 09/15/2020  . Failure to thrive in adult 09/15/2020  . Severe protein-calorie malnutrition Altamease Oiler: less than 60% of standard weight) ([place]) 09/15/2020  . Dysphagia 09/15/2020  . Hypotension 09/15/2020  . Abscess of  right lung with pneumonia ([place]) 09/14/2020  . Chronic low back pain (Left) w/o sciatica 09/10/2020  . Pharmacologic therapy 05/05/2020  . Chronic diarrhea 05/04/2020  . DDD (degenerative disc disease), lumbosacral 11/19/2019  . Therapeutic opioid-induced constipation (OIC) 08/01/2019  . Chronic sacroiliac joint pain (Left) 05/01/2019  . Chronic groin pain (Left) 05/01/2019  .  Spondylosis without myelopathy or radiculopathy, lumbosacral region 05/01/2019  . Other specified dorsopathies, sacral and sacrococcygeal region 05/01/2019  . Renal artery stenosis ([place]) 12/27/2017  . Pain in limb 12/22/2017  . Tobacco use disorder 12/22/2017  . Renal atrophy, right 12/22/2017  . Thoracic radiculitis 01/31/2017  . Chronic lower extremity pain (Primary Area of Pain) (Bilateral) (L>R) 04/25/2016  . Chronic upper extremity pain (Third area of Pain) (Bilateral) (L>R) 04/25/2016  . Disturbance of skin sensation 04/20/2016  . Neurogenic pain 04/20/2016  . Chronic hip pain (Bilateral) (L>R) 11/11/2015  . Vitamin D deficiency 11/11/2015  . Edema 09/02/2015  . 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 Encounter for therapeutic drug level monitoring 08/14/2015  . Long term current use of opiate analgesic 08/14/2015  . Long term prescription opiate use 08/14/2015  . Uncomplicated opioid dependence ([place]) 08/14/2015  . Opiate use (30 MME/Day) 08/14/2015  . Chronic low back pain (2ry area of Pain) (Bilateral) (L>R) w/ sciatica (Bilateral) 08/14/2015  . Chronic pain syndrome 08/14/2015  . Alcoholic ([place]) 17/40/8144  . B12 deficiency 08/14/2015  . Narrowing of intervertebral disc space 08/14/2015  . Encounter for screening for diabetes mellitus 08/14/2015  . History of metabolic disorder 81/85/6314  . Fungal infection of toenail 08/14/2015  . Osteopenia 08/14/2015  . Compulsive tobacco user syndrome 08/14/2015  . Osteoarthritis of hip (Left) 08/14/2015  . Chronic hip pain (Left) 08/14/2015  . Chronic lumbar radicular pain (L5 dermatome) (Bilateral) (L>R) 08/14/2015  . Lumbar facet syndrome (Bilateral) (L>R) 08/14/2015  . Lumbar spondylosis 08/14/2015  . 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 Lumbar Postlaminectomy syndrome (L4-5) 08/14/2015  . Fibromyalgia 08/14/2015  . Chronic airway obstruction ([place]) 04/21/2015  . Allergic rhinitis 02/10/2010   . Atrial paroxysmal tachycardia ([place]) 07/06/2009  . CAFL (chronic airflow limitation) ([place]) 07/06/2009  . Acid reflux 07/06/2009  . Glaucoma 07/06/2009  . Essential hypertension 07/06/2009  . Hypercholesterolemia without hypertriglyceridemia 07/06/2009  . Absence of bladder continence 07/06/2009   Past Medical History:  Diagnosis Date  . Closed left arm fracture   . COPD (chronic obstructive pulmonary disease) ([place])   . Dehydration 09/15/2020  . Glaucoma   . Hyperlipidemia   . Hypertension   . Lung mass        Medications: Outpatient Medications Prior to Visit  Medication Sig  . albuterol (PROVENTIL) (2.5 MG/3ML) 0.083% nebulizer solution Take 3 mLs (2.5 mg total) by nebulization every 6 (six) hours as needed for wheezing or shortness of breath.  Marland Kitchen albuterol (VENTOLIN HFA) 108 (90 Base) MCG/ACT inhaler Inhale 2 puffs into the lungs every 6 (six) hours as needed for wheezing or shortness of breath.  Marland Kitchen aspirin 325 MG tablet Take 1 tablet (325 mg total) by mouth daily.  Marland Kitchen atorvastatin (LIPITOR) 40 MG tablet Take 1 tablet (40 mg total) by mouth daily.  . Budeson-Glycopyrrol-Formoterol (BREZTRI AEROSPHERE) 160-9-4.8 MCG/ACT AERO Inhale 2 puffs into the lungs in the morning and at bedtime.  Marland Kitchen diltiazem (CARDIZEM) 120 MG tablet Take 1 tablet (120 mg total) by mouth daily.  . dorzolamide (TRUSOPT) 2 % ophthalmic solution Place 1 drop into both eyes 2 (  two) times daily.  Marland Kitchen latanoprost (XALATAN) 0.005 % ophthalmic solution Place 1 drop into both eyes at bedtime.  . lipase/protease/amylase 24000-76000 units CPEP Take 1 capsule (24,000 Units total) by mouth 3 (three) times daily before meals.  . magnesium gluconate (MAGONATE) 500 MG tablet Take 500 mg by mouth daily.  . mirtazapine (REMERON) 7.5 MG tablet Take 1 tablet (7.5 mg total) by mouth at bedtime.  . Multiple Vitamin (MULTI-VITAMIN) tablet Take 1 tablet by mouth daily.   Marland Kitchen omeprazole (PRILOSEC) 40 MG capsule Take 1 capsule (40 mg  total) by mouth daily.  . pregabalin (LYRICA) 150 MG capsule Take 1 capsule (150 mg total) by mouth every 8 (eight) hours.  . ramipril (ALTACE) 2.5 MG capsule Take 1 capsule (2.5 mg total) by mouth daily.  Marland Kitchen rOPINIRole (REQUIP) 2 MG tablet Take 1 tablet (2 mg total) by mouth 2 (two) times daily.  Marland Kitchen spironolactone (ALDACTONE) 25 MG tablet Take 1 tablet (25 mg total) by mouth daily.  . Torsemide 40 MG TABS Take 40 mg by mouth in the morning and at bedtime.  . vitamin B-12 (CYANOCOBALAMIN) 500 MCG tablet Take 1 tablet (500 mcg total) by mouth daily.  . [DISCONTINUED] cephALEXin (KEFLEX) 500 MG capsule Take 1 capsule (500 mg total) by mouth 2 (two) times daily.  . [DISCONTINUED] HYDROcodone-acetaminophen (NORCO) 7.5-325 MG tablet Take 1 tablet by mouth every 6 (six) hours as needed for severe pain. Must last 30 days  . [DISCONTINUED] HYDROcodone-acetaminophen (NORCO) 7.5-325 MG tablet Take 1 tablet by mouth every 6 (six) hours as needed for severe pain. Must last 30 days  . [DISCONTINUED] HYDROcodone-acetaminophen (NORCO) 7.5-325 MG tablet Take 1 tablet by mouth every 6 (six) hours as needed for severe pain. Must last 30 days   No facility-administered medications prior to visit.    Review of Systems  Constitutional: Positive for appetite change, fatigue and unexpected weight change.  Respiratory: Negative.   Cardiovascular: Negative.   Hematological: Negative.     Last CBC Lab Results  Component Value Date   WBC 10.6 01/20/2021   HGB 13.5 01/20/2021   HCT 39.9 01/20/2021   MCV 87 01/20/2021   MCH 29.5 01/20/2021   RDW 13.0 01/20/2021   PLT 397 52/84/1324   Last metabolic panel Lab Results  Component Value Date   GLUCOSE 82 01/20/2021   NA 139 01/20/2021   K 4.5 01/20/2021   CL 95 (L) 01/20/2021   CO2 26 01/20/2021   BUN 25 01/20/2021   CREATININE 1.03 (H) 01/20/2021   GFRNONAA 54 (L) 11/11/2020   GFRAA 62 11/11/2020   CALCIUM 10.1 01/20/2021   PHOS 2.5 09/17/2020   PROT  7.1 01/20/2021   ALBUMIN 4.1 01/20/2021   LABGLOB 3.0 01/20/2021   AGRATIO 1.4 01/20/2021   BILITOT 0.3 01/20/2021   ALKPHOS 217 (H) 01/20/2021   AST 16 01/20/2021   ALT 8 01/20/2021   ANIONGAP 7 09/17/2020       Objective    BP (!) 175/90 (BP Location: Left Arm, Patient Position: Sitting, Cuff Size: Normal)   Pulse 77   Temp 99 F (37.2 C) (Oral)   Wt 93 lb 6.4 oz (42.4 kg)   SpO2 97%   BMI 18.86 kg/m  BP Readings from Last 3 Encounters:  01/27/21 (!) 115/50  01/20/21 (!) 175/90  12/17/20 (!) 165/81   Wt Readings from Last 3 Encounters:  01/27/21 93 lb (42.2 kg)  01/20/21 93 lb 6.4 oz (42.4 kg)  12/17/20 104 lb (47.2  kg)       Physical Exam Vitals reviewed.  Constitutional:      General: She is not in acute distress.    Appearance: Normal appearance. She is well-developed and normal weight. She is not ill-appearing or diaphoretic.  HENT:     Head: Normocephalic and atraumatic.  Cardiovascular:     Rate and Rhythm: Normal rate and regular rhythm.     Heart sounds: Normal heart sounds. No murmur heard. No friction rub. No gallop.   Pulmonary:     Effort: Pulmonary effort is normal. No respiratory distress.     Breath sounds: Normal breath sounds. No wheezing or rales.  Musculoskeletal:     Cervical back: Normal range of motion and neck supple.  Neurological:     Mental Status: She is alert.     Results for orders placed or performed in visit on 01/20/21  CBC w/Diff/Platelet  Result Value Ref Range   WBC 10.6 3.4 - 10.8 x10E3/uL   RBC 4.58 3.77 - 5.28 x10E6/uL   Hemoglobin 13.5 11.1 - 15.9 g/dL   Hematocrit 39.9 34.0 - 46.6 %   MCV 87 79 - 97 fL   MCH 29.5 26.6 - 33.0 pg   MCHC 33.8 31.5 - 35.7 g/dL   RDW 13.0 11.7 - 15.4 %   Platelets 397 150 - 450 x10E3/uL   Neutrophils 73 Not Estab. %   Lymphs 15 Not Estab. %   Monocytes 10 Not Estab. %   Eos 1 Not Estab. %   Basos 1 Not Estab. %   Neutrophils Absolute 7.7 (H) 1.4 - 7.0 x10E3/uL   Lymphocytes  Absolute 1.6 0.7 - 3.1 x10E3/uL   Monocytes Absolute 1.0 (H) 0.1 - 0.9 x10E3/uL   EOS (ABSOLUTE) 0.1 0.0 - 0.4 x10E3/uL   Basophils Absolute 0.1 0.0 - 0.2 x10E3/uL   Immature Granulocytes 0 Not Estab. %   Immature Grans (Abs) 0.0 0.0 - 0.1 x10E3/uL  Comprehensive Metabolic Panel (CMET)  Result Value Ref Range   Glucose 82 65 - 99 mg/dL   BUN 25 8 - 27 mg/dL   Creatinine, Ser 1.03 (H) 0.57 - 1.00 mg/dL   eGFR 57 (L) >59 mL/min/1.73   BUN/Creatinine Ratio 24 12 - 28   Sodium 139 134 - 144 mmol/L   Potassium 4.5 3.5 - 5.2 mmol/L   Chloride 95 (L) 96 - 106 mmol/L   CO2 26 20 - 29 mmol/L   Calcium 10.1 8.7 - 10.3 mg/dL   Total Protein 7.1 6.0 - 8.5 g/dL   Albumin 4.1 3.7 - 4.7 g/dL   Globulin, Total 3.0 1.5 - 4.5 g/dL   Albumin/Globulin Ratio 1.4 1.2 - 2.2   Bilirubin Total 0.3 0.0 - 1.2 mg/dL   Alkaline Phosphatase 217 (H) 44 - 121 IU/L   AST 16 0 - 40 IU/L   ALT 8 0 - 32 IU/L  B Nat Peptide  Result Value Ref Range   BNP 33.9 0.0 - 100.0 pg/mL    Assessment & Plan     1. Localized swelling of right lower leg Worsening acutely again. Will get [place] to r/o DVT. Will check labs as below and f/u pending results. Patient reports the last time the lung abscess was active and initially found she had all these same symptoms and feels that the abscess may be returning as she is starting to feel the same way. She does have a follow up with Dr. Patsey Berthold, Pulmonology, upcoming this month.  - US Venous Img  Lower Unilateral Right; Future - CBC w/Diff/Platelet - Comprehensive Metabolic Panel (CMET) - B Nat Peptide - AMB Referral to [facility] Coordinaton  2. Primary hypertension Elevated today. Will check labs as below and f/u pending results. - CBC w/Diff/Platelet - Comprehensive Metabolic Panel (CMET) - B Nat Peptide - AMB Referral to [facility] Coordinaton  3. Renal artery stenosis ([place]) Long history. Followed by Vascular, Dr. Lucky Cowboy. Will check labs as below and f/u pending  results. - CBC w/Diff/Platelet - Comprehensive Metabolic Panel (CMET) - B Nat Peptide - AMB Referral to [facility] Coordinaton  4. CAFL (chronic airflow limitation) ([facility]) Stable currently. Will check labs as below and f/u pending results. - CBC w/Diff/Platelet - Comprehensive Metabolic Panel (CMET) - B Nat Peptide - AMB Referral to [facility] Coordinaton  5. Severe protein-calorie malnutrition Altamease Oiler: less than 60% of standard weight) ([place]) Was doing better but has lost down again from 104 pounds on 12/17/20 to now 93 pounds today. Discussed increasing mirtazapine, but she declines at this time. Will check labs as below and f/u pending results. - CBC w/Diff/Platelet - Comprehensive Metabolic Panel (CMET) - AMB Referral to [facility]  6. Muscle cramp Stable. Diagnosis pulled for medication refill. Continue current medical treatment plan. - tizanidine (ZANAFLEX) 2 MG capsule; Take 1 capsule (2 mg total) by mouth 3 (three) times daily as needed for muscle spasms.  Dispense: 90 capsule; Refill: 1   No follow-ups on file.      Reynolds Bowl, PA-C, have reviewed all documentation for this visit. The documentation on 01/31/21 for the exam, diagnosis, procedures, and orders are all accurate and complete.   Rubye Beach  [facility] [phone] (phone) [phone] (fax)  [place]

## 2021-01-21 ENCOUNTER — Telehealth: Payer: Self-pay | Admitting: Physician Assistant

## 2021-01-21 ENCOUNTER — Telehealth: Payer: Self-pay | Admitting: *Deleted

## 2021-01-21 LAB — COMPREHENSIVE METABOLIC PANEL
ALT: 8 IU/L (ref 0–32)
AST: 16 IU/L (ref 0–40)
Albumin/Globulin Ratio: 1.4 (ref 1.2–2.2)
Albumin: 4.1 g/dL (ref 3.7–4.7)
Alkaline Phosphatase: 217 IU/L — ABNORMAL HIGH (ref 44–121)
BUN/Creatinine Ratio: 24 (ref 12–28)
BUN: 25 mg/dL (ref 8–27)
Bilirubin Total: 0.3 mg/dL (ref 0.0–1.2)
CO2: 26 mmol/L (ref 20–29)
Calcium: 10.1 mg/dL (ref 8.7–10.3)
Chloride: 95 mmol/L — ABNORMAL LOW (ref 96–106)
Creatinine, Ser: 1.03 mg/dL — ABNORMAL HIGH (ref 0.57–1.00)
Globulin, Total: 3 g/dL (ref 1.5–4.5)
Glucose: 82 mg/dL (ref 65–99)
Potassium: 4.5 mmol/L (ref 3.5–5.2)
Sodium: 139 mmol/L (ref 134–144)
Total Protein: 7.1 g/dL (ref 6.0–8.5)
eGFR: 57 mL/min/{1.73_m2} — ABNORMAL LOW (ref >59)

## 2021-01-21 LAB — CBC WITH DIFFERENTIAL/PLATELET
Basophils Absolute: 0.1 10*3/uL (ref 0.0–0.2)
Basos: 1 %
EOS (ABSOLUTE): 0.1 10*3/uL (ref 0.0–0.4)
Eos: 1 %
Hematocrit: 39.9 % (ref 34.0–46.6)
Hemoglobin: 13.5 g/dL (ref 11.1–15.9)
Immature Grans (Abs): 0 10*3/uL (ref 0.0–0.1)
Immature Granulocytes: 0 %
Lymphocytes Absolute: 1.6 10*3/uL (ref 0.7–3.1)
Lymphs: 15 %
MCH: 29.5 pg (ref 26.6–33.0)
MCHC: 33.8 g/dL (ref 31.5–35.7)
MCV: 87 fL (ref 79–97)
Monocytes Absolute: 1 10*3/uL — ABNORMAL HIGH (ref 0.1–0.9)
Monocytes: 10 %
Neutrophils Absolute: 7.7 10*3/uL — ABNORMAL HIGH (ref 1.4–7.0)
Neutrophils: 73 %
Platelets: 397 10*3/uL (ref 150–450)
RBC: 4.58 x10E6/uL (ref 3.77–5.28)
RDW: 13 % (ref 11.7–15.4)
WBC: 10.6 10*3/uL (ref 3.4–10.8)

## 2021-01-21 LAB — BRAIN NATRIURETIC PEPTIDE: BNP: 33.9 pg/mL (ref 0.0–100.0)

## 2021-01-21 NOTE — Telephone Encounter (Signed)
David with [employer] [facility] called in for assistance.   Per pharmacist pt is currently prescribed tizanidine (ZANAFLEX) 2 MG capsule and  also Vicodin, Onalee Hua states that medications could couter act. He would like to discuss further.    ALSO, Onalee Hua would like clarity on Capsules or Tablets for tizanidine (ZANAFLEX) 2 MG capsule ?  Please assist   CB: [id number] Ref# [id number]

## 2021-01-21 NOTE — Chronic Care Management (AMB) (Signed)
  Chronic Care Management   Note  01/21/2021 Name: MAHASIN RIVIERE MRN: [id number] DOB: September 22, 1946  ROGUE PAUTLER is a 75 y.o. year old female who is a primary care patient of Mar Daring, [place]. I reached out to Lamonte Sakai by phone today in response to a referral sent by Ms. Court Joy Reinhold's PCP, Mar Daring, PA-C.     Ms. Wisman was given information about Chronic Care Management services today including:  1. CCM service includes personalized support from designated clinical staff supervised by her physician, including individualized plan of care and coordination with other care providers 2. 24/7 contact phone numbers for assistance for urgent and routine care needs. 3. Service will only be billed when office clinical staff spend 20 minutes or more in a month to coordinate care. 4. Only one practitioner may furnish and bill the service in a calendar month. 5. The patient may stop CCM services at any time (effective at the end of the month) by phone call to the office staff. 6. The patient will be responsible for cost sharing (co-pay) of up to 20% of the service fee (after annual deductible is met).  Patient agreed to services and verbal consent obtained.   Follow up plan: Telephone appointment with care management team member scheduled BVP:[id number] 02/01/2021 and PharmD 02/12/2021  [place] [facility]  Direct Dial: [phone]

## 2021-01-21 NOTE — Telephone Encounter (Signed)
Please review. Thanks!  

## 2021-01-25 ENCOUNTER — Ambulatory Visit: Payer: Medicare Other | Admitting: Pulmonary Disease

## 2021-01-25 NOTE — Telephone Encounter (Signed)
She has been instructed to take an hour apart.

## 2021-01-26 DIAGNOSIS — Z79891 Long term (current) use of opiate analgesic: Secondary | ICD-10-CM | POA: Insufficient documentation

## 2021-01-26 NOTE — Progress Notes (Signed)
PROVIDER NOTE: Information contained herein reflects review and annotations entered in association with encounter. Interpretation of such information and data should be left to medically-trained personnel. Information provided to patient can be located elsewhere in the medical record under "Patient Instructions". Document created using STT-dictation technology, any transcriptional errors that may result from process are unintentional.    Patient: Suzanne Avila  Service Category: E/M  Provider: Gaspar Cola, MD  DOB: 10/10/1946  DOS: 01/27/2021  Specialty: Interventional Pain Management  MRN: [id number]  Setting: Ambulatory outpatient  PCP: Mar Daring, PA-C  Type: Established Patient    Referring Provider: Florian Buff*  Location: Office  Delivery: Face-to-face     HPI  [name], a 75 y.o. year old female, is here today because of her Chronic pain syndrome [G89.4]. [name] primary complain today is Back Pain Last encounter: My last encounter with her was on 11/30/2020. Pertinent problems: 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); Lumbar Postlaminectomy syndrome (L4-5); Fibromyalgia; Chronic hip pain (Bilateral) (L>R); Neurogenic pain; Chronic lower extremity pain (Primary Area of Pain) (Bilateral) (L>R); Chronic upper extremity pain (Third area of Pain) (Bilateral) (L>R); Thoracic radiculitis; Pain in limb; Chronic sacroiliac joint pain (Left); Chronic groin pain (Left); Spondylosis without myelopathy or radiculopathy, lumbosacral region; Other specified dorsopathies, sacral and  sacrococcygeal region; DDD (degenerative disc disease), lumbosacral; Chronic low back pain (Left) w/o sciatica; Avascular necrosis of femoral head (Right) ([place]); and 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 Pain Assessment: Severity of Chronic pain is reported as a 3 /10. Location: Back Lower/Denies. Onset: More than a month ago. Quality: Aching,Burning,Throbbing. Timing: Constant. Modifying factor(s): Meds and heat, lay Avila. Vitals:  height is _0  (1.499 m) and weight is 93 lb (42.2 kg). Her temperature is 97.2 F (36.2 C) (abnormal). Her blood pressure is 115/50 (abnormal) and her pulse is 79. Her oxygen saturation is 98%.   Reason for encounter: medication management.   The patient indicates doing well with the current medication regimen. No adverse reactions or side effects reported to the medications.   RTCB: 05/01/2021 Nonopioids transferred 07/27/2020: Lyrica  Pharmacotherapy Assessment   Analgesic: Hydrocodone/APAP 7.5/325, 1 tab PO q 6 hrs (30 mg/day of hydrocodone) MME/day:30 mg/day.   Monitoring: [place] PMP: PDMP reviewed during this encounter.       Pharmacotherapy: No side-effects or adverse reactions reported. Compliance: No problems identified. Effectiveness: Clinically acceptable.  Chauncey Fischer, RN  01/27/2021 11:47 AM  Sign when Signing Visit Nursing Pain Medication Assessment:  Safety precautions to be maintained throughout the outpatient stay will include: orient to surroundings, keep bed in low position, maintain call bell within reach at all times, provide assistance with transfer out of bed and ambulation.  Medication Inspection Compliance: Pill count conducted under aseptic conditions, in front of the patient. Neither the pills nor the bottle was removed from the patient's sight at any time. Once count was completed pills were immediately returned to the patient in their original bottle.  Medication: Hydrocodone/APAP Pill/Patch  Count: 12 of 120 pills remain Pill/Patch Appearance: Markings consistent with prescribed medication Bottle Appearance: Standard pharmacy container. Clearly labeled. Filled Date:  3 / 11 / 22 Last Medication intake:  TodaySafety precautions to be maintained throughout the outpatient stay will include: orient to surroundings, keep bed in low position, maintain call bell within reach at all times, provide assistance with transfer out of bed and ambulation.     UDS:  Summary  Date Value Ref Range Status  05/06/2020 Note  Final    Comment:    ==================================================================== [employer] [facility] 13 (MW) ==================================================================== Specimen Alert Note: Urinary creatinine is low; ability to detect some drugs may be compromised. Interpret results with caution. (Creatinine) ==================================================================== Test                             Result       Flag       Units  Drug Present and Declared for Prescription Verification   Hydrocodone                    3460         EXPECTED   ng/mg creat   Hydromorphone                  1380         EXPECTED   ng/mg creat   Dihydrocodeine                 580          EXPECTED   ng/mg creat   Norhydrocodone                 2430         EXPECTED   ng/mg creat    Sources of hydrocodone include scheduled prescription medications.    Hydromorphone, dihydrocodeine and norhydrocodone are expected    metabolites of hydrocodone. Hydromorphone and dihydrocodeine are    also available as scheduled prescription medications.  ==================================================================== Test                      Result    Flag   Units      Ref Range   Creatinine              10        LL     mg/dL      >=20 ==================================================================== Declared Medications:  The flagging and interpretation on this report are based  on the  following declared medications.  Unexpected results may arise from  inaccuracies in the declared medications.   **Note: The testing scope of this panel includes these medications:   Hydrocodone   **Note: The testing scope of this panel does not include the  following reported medications:   Acetaminophen  Albuterol  Aspirin  Atorvastatin  Cyanocobalamin  Diltiazem  Dorzolamide  Fluticasone (Advair)  Furosemide (Lasix)  Latanoprost  Magnesium  Meloxicam  Multivitamin  Omeprazole  Pregabalin (Lyrica)  Ramipril  Ropinirole  Salmeterol (Advair)  Spironolactone ==================================================================== For clinical consultation, please call [phone]. ====================================================================      ROS  Constitutional: Denies any fever or chills Gastrointestinal: No reported hemesis, hematochezia, vomiting, or acute GI distress Musculoskeletal: Denies any acute onset joint swelling, redness, loss of ROM, or weakness Neurological: No reported episodes of acute onset apraxia, aphasia, dysarthria, agnosia, amnesia, paralysis, loss of coordination, or loss of consciousness  Medication Review  Budeson-Glycopyrrol-Formoterol, HYDROcodone-acetaminophen, Multi-Vitamin, Pancrelipase (Lip-Prot-Amyl), Torsemide, albuterol, aspirin, atorvastatin, diltiazem, dorzolamide, latanoprost, magnesium gluconate, mirtazapine, omeprazole, pregabalin, rOPINIRole, ramipril, spironolactone, tizanidine, and vitamin B-12  History Review  Allergy: Ms. Egli is allergic to codeine, cyclobenzaprine, and penicillins. Drug: Ms. Hugill  reports no history of drug use. Alcohol:  reports current alcohol use of about 35.0 standard drinks of alcohol per week. Tobacco:  reports that she has been smoking cigarettes. She has a 75.00 pack-year smoking history. She has never used smokeless tobacco. Social: Ms. Moncrief  reports that she has  been smoking cigarettes. She has a 75.00 pack-year smoking history. She has never used smokeless tobacco. She reports current alcohol use of about 35.0 standard drinks of alcohol per week. She reports that she does not use drugs. Medical:  has a past medical history of Closed left arm fracture, COPD (chronic obstructive pulmonary disease) ([place]), Dehydration (09/15/2020), Glaucoma, Hyperlipidemia, Hypertension, and Lung mass. Surgical: Ms. Lagares  has a past surgical history that includes Back surgery; Eye surgery; and Carpal tunnel release (Left). Family: family history includes Cancer in her father and mother.  Laboratory Chemistry Profile   Renal Lab Results  Component Value Date   BUN 25 01/20/2021   CREATININE 1.03 (H) 01/20/2021   BCR 24 01/20/2021   GFRAA 62 11/11/2020   GFRNONAA 54 (L) 11/11/2020     Hepatic Lab Results  Component Value Date   AST 16 01/20/2021   ALT 8 01/20/2021   ALBUMIN 4.1 01/20/2021   ALKPHOS 217 (H) 01/20/2021     Electrolytes Lab Results  Component Value Date   NA 139 01/20/2021   K 4.5 01/20/2021   CL 95 (L) 01/20/2021   CALCIUM 10.1 01/20/2021   MG 1.6 (L) 09/17/2020   PHOS 2.5 09/17/2020     Bone Lab Results  Component Value Date   VD25OH 45.9 02/03/2020     Inflammation (CRP: Acute Phase) (ESR: Chronic Phase) Lab Results  Component Value Date   CRP <0.8 02/14/2017   ESRSEDRATE 20 02/14/2017   LATICACIDVEN 1.1 09/15/2020       Note: Above Lab results reviewed.  Recent Imaging Review  CT CHEST WO CONTRAST CLINICAL DATA:  Evaluate lung mass.  EXAM: CT CHEST WITHOUT CONTRAST  TECHNIQUE: Multidetector CT imaging of the chest was performed following the standard protocol without IV contrast.  COMPARISON:  10/06/2020  FINDINGS: Cardiovascular: The heart size appears within normal limits. No pericardial effusion. Ascending thoracic aorta measures 3.5 cm, image 61/5. Aortic atherosclerosis. Coronary artery  calcifications.  Mediastinum/Nodes: No enlarged mediastinal or axillary lymph nodes. Thyroid gland, trachea, and esophagus demonstrate no significant findings.  Lungs/Pleura: No pleural effusion, airspace consolidation, or atelectasis. Cavitary lesion within the right upper lobe with surrounding architectural distortion and scarring is again identified. On today's exam this measures 1.9 x 2.0 by 1.5 cm (volume = 3 cm^3), image 90/5. Previously this measured 3.0 x 3.5 by 3.5 cm (volume = 19 cm^3).  Upper Abdomen: No acute abnormality.  Musculoskeletal: Mild curvature of the thoracic spine. Mild superior endplate compression deformity is identified involving, image 84/7. This is new from 10/06/2020.  IMPRESSION: 1. Interval decrease in size of cavitary lesion in the right upper lobe consistent with resolving infection. 2. Coronary artery calcifications noted. 3. Mild superior endplate compression deformity involving T11 is new from 10/06/2020  Aortic Atherosclerosis (ICD10-I70.0).,  Electronically Signed   By: Kerby Moors M.D.   On: 12/23/2020 10:23 Note: Reviewed        Physical Exam  General appearance: Well nourished, well developed, and well hydrated. In no apparent acute distress Mental status: Alert, oriented x 3 (person, place, & time)  Respiratory: No evidence of acute respiratory distress Eyes: PERLA Vitals: BP (!) 115/50   Pulse 79   Temp (!) 97.2 F (36.2 C)   Ht _0  (1.499 m)   Wt 93 lb (42.2 kg)   SpO2 98%   BMI 18.78 kg/m  BMI: Estimated body mass index is 18.78 kg/m as calculated from the following:   Height as of this encounter: _1  (1.499 m).   Weight as of this encounter: 93 lb (42.2 kg). Ideal: Female patients must weigh at least 45.5 kg to calculate ideal body weight  Assessment   Status Diagnosis  Controlled Controlled Controlled 1. Chronic pain syndrome   2. Chronic low back pain (2ry area of Pain) (Bilateral) (L>R) w/  sciatica (Bilateral)   3. Chronic lower extremity pain (Primary Area of Pain) (Bilateral) (L>R)   4. 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 Lumbar facet syndrome (Bilateral) (L>R)   6. Pharmacologic therapy   7. Chronic use of opiate for therapeutic purpose   8. Uncomplicated opioid dependence ([place])      Updated Problems: No problems updated.  Plan of Care  Problem-specific:  No problem-specific Assessment & Plan notes found for this encounter.  Ms. TROYCE FEBO has a current medication list which includes the following long-term medication(s): albuterol, albuterol, atorvastatin, diltiazem, [START ON 01/31/2021] hydrocodone-acetaminophen, [START ON 03/02/2021] hydrocodone-acetaminophen, [START ON 04/01/2021] hydrocodone-acetaminophen, mirtazapine, omeprazole, pregabalin, ramipril, ropinirole, spironolactone, and torsemide.  Pharmacotherapy (Medications Ordered): Meds ordered this encounter  Medications  . HYDROcodone-acetaminophen (NORCO) 7.5-325 MG tablet    Sig: Take 1 tablet by mouth every 6 (six) hours as needed for severe pain. Must last 30 days    Dispense:  120 tablet    Refill:  0    Chronic Pain: STOP Act (Not applicable) Fill 1 day early if closed on refill date. Avoid benzodiazepines within 8 hours of opioids  . HYDROcodone-acetaminophen (NORCO) 7.5-325 MG tablet    Sig: Take 1 tablet by mouth every 6 (six) hours as needed for severe pain. Must last 30 days    Dispense:  120 tablet    Refill:  0    Chronic Pain: STOP Act (Not applicable) Fill 1 day early if closed on refill date. Avoid benzodiazepines within 8 hours of opioids  . HYDROcodone-acetaminophen (NORCO) 7.5-325 MG tablet    Sig: Take 1 tablet by mouth every 6 (six) hours as needed for severe pain. Must last 30 days    Dispense:  120 tablet    Refill:  0    Chronic Pain: STOP Act (Not applicable) Fill 1 day early if closed on refill date. Avoid benzodiazepines within 8 hours of opioids   Orders:  No orders  of the defined types were placed in this encounter.  Follow-up plan:   Return in about 3 months (around 05/01/2021) for (F2F), (MM).      Considering:   Diagnostic bilateral IA hip joint injection  Possible bilateral hip joint RFA  Possible bilateral lumbar facet RFA  Possible left SI joint RFA  Diagnostic bilateral L4-L5 TFESI  Diagnostic caudal ESI  Diagnostic caudal epidurogram  Possible RACZ epidural lysis of adhesions  Diagnostic left L2-3 LESI  Possible bilateral Lumbar SCS trial implant    Palliative PRN treatment(s):   Diagnostic right lumbar facet block #2  Diagnostic left lumbar facet block #3  Diagnostic left SI joint block #3       Recent Visits Date Type Provider Dept  11/10/20 Procedure visit Milinda Pointer, MD  [facility]  11/02/20 Office Visit Milinda Pointer, MD [facility]  Showing recent visits within past 90 days and meeting all other requirements Today's Visits Date Type Provider Dept  01/27/21 Office Visit Milinda Pointer, MD [facility]  Showing today's visits and meeting all other requirements Future Appointments No visits were found meeting these conditions. Showing future appointments within next 90 days and meeting all other requirements  I discussed the assessment and treatment plan with the patient. The patient was provided an opportunity to ask questions and all were answered. The patient agreed with the plan and demonstrated an understanding of the instructions.  Patient advised to call back or seek an in-person evaluation if the symptoms or condition worsens.  Duration of encounter: 20 minutes.  Note by: Gaspar Cola, MD Date: 01/27/2021; Time: 12:43 PM

## 2021-01-27 ENCOUNTER — Encounter: Payer: Self-pay | Admitting: Pain Medicine

## 2021-01-27 ENCOUNTER — Other Ambulatory Visit: Payer: Self-pay

## 2021-01-27 ENCOUNTER — Ambulatory Visit: Payer: Medicare Other | Attending: Pain Medicine | Admitting: Pain Medicine

## 2021-01-27 VITALS — BP 115/50 | HR 79 | Temp 97.2°F | Ht 59.0 in | Wt 93.0 lb

## 2021-01-27 DIAGNOSIS — M5441 Lumbago with sciatica, right side: Secondary | ICD-10-CM

## 2021-01-27 DIAGNOSIS — M79604 Pain in right leg: Secondary | ICD-10-CM | POA: Diagnosis present

## 2021-01-27 DIAGNOSIS — Z79899 Other long term (current) drug therapy: Secondary | ICD-10-CM

## 2021-01-27 DIAGNOSIS — M5442 Lumbago with sciatica, left side: Secondary | ICD-10-CM | POA: Insufficient documentation

## 2021-01-27 DIAGNOSIS — M79605 Pain in left leg: Secondary | ICD-10-CM

## 2021-01-27 DIAGNOSIS — F1120 Opioid dependence, uncomplicated: Secondary | ICD-10-CM

## 2021-01-27 DIAGNOSIS — M961 Postlaminectomy syndrome, not elsewhere classified: Secondary | ICD-10-CM | POA: Diagnosis present

## 2021-01-27 DIAGNOSIS — Z79891 Long term (current) use of opiate analgesic: Secondary | ICD-10-CM | POA: Diagnosis present

## 2021-01-27 DIAGNOSIS — G8929 Other chronic pain: Secondary | ICD-10-CM | POA: Diagnosis present

## 2021-01-27 DIAGNOSIS — M47816 Spondylosis without myelopathy or radiculopathy, lumbar region: Secondary | ICD-10-CM | POA: Diagnosis present

## 2021-01-27 DIAGNOSIS — G894 Chronic pain syndrome: Secondary | ICD-10-CM

## 2021-01-27 MED ORDER — HYDROCODONE-ACETAMINOPHEN 7.5-325 MG PO TABS: 1.0000 | ORAL_TABLET | Freq: Four times a day (QID) | ORAL | 0 refills | Status: DC | PRN

## 2021-01-27 NOTE — Patient Instructions (Signed)
____________________________________________________________________________________________  Medication Rules  Purpose: To inform patients, and their family members, of our rules and regulations.  Applies to: All patients receiving prescriptions (written or electronic).  Pharmacy of record: Pharmacy where electronic prescriptions will be sent. If written prescriptions are taken to a different pharmacy, please inform the nursing staff. The pharmacy listed in the electronic medical record should be the one where you would like electronic prescriptions to be sent.  Electronic prescriptions: In compliance with the  Strengthen Opioid Misuse Prevention (STOP) Act of 2017 (Session Law 2017-74/H243), effective October 24, 2018, all controlled substances must be electronically prescribed. Calling prescriptions to the pharmacy will cease to exist.  Prescription refills: Only during scheduled appointments. Applies to all prescriptions.  NOTE: The following applies primarily to controlled substances (Opioid* Pain Medications).   Type of encounter (visit): For patients receiving controlled substances, face-to-face visits are required. (Not an option or up to the patient.)  Patient's responsibilities: 1. Pain Pills: Bring all pain pills to every appointment (except for procedure appointments). 2. Pill Bottles: Bring pills in original pharmacy bottle. Always bring the newest bottle. Bring bottle, even if empty. 3. Medication refills: You are responsible for knowing and keeping track of what medications you take and those you need refilled. The day before your appointment: write a list of all prescriptions that need to be refilled. The day of the appointment: give the list to the admitting nurse. Prescriptions will be written only during appointments. No prescriptions will be written on procedure days. If you forget a medication: it will not be "Called in", "Faxed", or "electronically sent".  You will need to get another appointment to get these prescribed. No early refills. Do not call asking to have your prescription filled early. 4. Prescription Accuracy: You are responsible for carefully inspecting your prescriptions before leaving our office. Have the discharge nurse carefully go over each prescription with you, before taking them home. Make sure that your name is accurately spelled, that your address is correct. Check the name and dose of your medication to make sure it is accurate. Check the number of pills, and the written instructions to make sure they are clear and accurate. Make sure that you are given enough medication to last until your next medication refill appointment. 5. Taking Medication: Take medication as prescribed. When it comes to controlled substances, taking less pills or less frequently than prescribed is permitted and encouraged. Never take more pills than instructed. Never take medication more frequently than prescribed.  6. Inform other Doctors: Always inform, all of your healthcare providers, of all the medications you take. 7. Pain Medication from other Providers: You are not allowed to accept any additional pain medication from any other Doctor or Healthcare provider. There are two exceptions to this rule. (see below) In the event that you require additional pain medication, you are responsible for notifying us, as stated below. 8. Cough Medicine: Often these contain an opioid, such as codeine or hydrocodone. Never accept or take cough medicine containing these opioids if you are already taking an opioid* medication. The combination may cause respiratory failure and death. 9. Medication Agreement: You are responsible for carefully reading and following our Medication Agreement. This must be signed before receiving any prescriptions from our practice. Safely store a copy of your signed Agreement. Violations to the Agreement will result in no further prescriptions.  (Additional copies of our Medication Agreement are available upon request.) 10. Laws, Rules, & Regulations: All patients are expected to follow all   Federal and State Laws, Statutes, Rules, & Regulations. Ignorance of the Laws does not constitute a valid excuse.  11. Illegal drugs and Controlled Substances: The use of illegal substances (including, but not limited to marijuana and its derivatives) and/or the illegal use of any controlled substances is strictly prohibited. Violation of this rule may result in the immediate and permanent discontinuation of any and all prescriptions being written by our practice. The use of any illegal substances is prohibited. 12. Adopted CDC guidelines & recommendations: Target dosing levels will be at or below 60 MME/day. Use of benzodiazepines** is not recommended.  Exceptions: There are only two exceptions to the rule of not receiving pain medications from other Healthcare Providers. 1. Exception #1 (Emergencies): In the event of an emergency (i.e.: accident requiring emergency care), you are allowed to receive additional pain medication. However, you are responsible for: As soon as you are able, call our office [phone], at any time of the day or night, and leave a message stating your name, the date and nature of the emergency, and the name and dose of the medication prescribed. In the event that your call is answered by a member of our staff, make sure to document and save the date, time, and the name of the person that took your information.  2. Exception #2 (Planned Surgery): In the event that you are scheduled by another doctor or dentist to have any type of surgery or procedure, you are allowed (for a period no longer than 30 days), to receive additional pain medication, for the acute post-op pain. However, in this case, you are responsible for picking up a copy of our "Post-op Pain Management for Surgeons" handout, and giving it to your surgeon or dentist. This  document is available at our office, and does not require an appointment to obtain it. Simply go to our office during business hours (Monday-Thursday from 8:00 AM to 4:00 PM) (Friday 8:00 AM to 12:00 Noon) or if you have a scheduled appointment with us, prior to your surgery, and ask for it by name. In addition, you are responsible for: calling our office [phone], at any time of the day or night, and leaving a message stating your name, name of your surgeon, type of surgery, and date of procedure or surgery. Failure to comply with your responsibilities may result in termination of therapy involving the controlled substances.  *Opioid medications include: morphine, codeine, oxycodone, oxymorphone, hydrocodone, hydromorphone, meperidine, tramadol, tapentadol, buprenorphine, fentanyl, methadone. **Benzodiazepine medications include: diazepam (Valium), alprazolam (Xanax), clonazepam (Klonopine), lorazepam (Ativan), clorazepate (Tranxene), chlordiazepoxide (Librium), estazolam (Prosom), oxazepam (Serax), temazepam (Restoril), triazolam (Halcion) (Last updated: 09/21/2020) ____________________________________________________________________________________________    

## 2021-01-27 NOTE — Progress Notes (Signed)
Nursing Pain Medication Assessment:  Safety precautions to be maintained throughout the outpatient stay will include: orient to surroundings, keep bed in low position, maintain call bell within reach at all times, provide assistance with transfer out of bed and ambulation.  Medication Inspection Compliance: Pill count conducted under aseptic conditions, in front of the patient. Neither the pills nor the bottle was removed from the patient's sight at any time. Once count was completed pills were immediately returned to the patient in their original bottle.  Medication: Hydrocodone/APAP Pill/Patch Count: 12 of 120 pills remain Pill/Patch Appearance: Markings consistent with prescribed medication Bottle Appearance: Standard pharmacy container. Clearly labeled. Filled Date: 3 / 17 / 22 Last Medication intake:  TodaySafety precautions to be maintained throughout the outpatient stay will include: orient to surroundings, keep bed in low position, maintain call bell within reach at all times, provide assistance with transfer out of bed and ambulation.

## 2021-01-27 NOTE — Progress Notes (Deleted)
Subjective:   Suzanne Avila is a 75 y.o. female who presents for Medicare Annual (Subsequent) preventive examination.  *** Review of Systems    N/A        Objective:    There were no vitals filed for this visit. There is no height or weight on file to calculate BMI.  Advanced Directives 09/15/2020 09/14/2020 09/04/2020 08/25/2020 01/22/2020 11/19/2019 06/27/2019  Does Patient Have a Medical Advance Directive? - No No No No No No  Would patient like information on creating a medical advance directive? No - Patient declined - Yes (MAU/Ambulatory/Procedural Areas - Information given) - No - Patient declined - -    Current Medications (verified) Outpatient Encounter Medications as of 01/28/2021  Medication Sig  . albuterol (PROVENTIL) (2.5 MG/3ML) 0.083% nebulizer solution Take 3 mLs (2.5 mg total) by nebulization every 6 (six) hours as needed for wheezing or shortness of breath.  Marland Kitchen albuterol (VENTOLIN HFA) 108 (90 Base) MCG/ACT inhaler Inhale 2 puffs into the lungs every 6 (six) hours as needed for wheezing or shortness of breath.  Marland Kitchen aspirin 325 MG tablet Take 1 tablet (325 mg total) by mouth daily.  Marland Kitchen atorvastatin (LIPITOR) 40 MG tablet Take 1 tablet (40 mg total) by mouth daily.  . Budeson-Glycopyrrol-Formoterol (BREZTRI AEROSPHERE) 160-9-4.8 MCG/ACT AERO Inhale 2 puffs into the lungs in the morning and at bedtime.  Marland Kitchen diltiazem (CARDIZEM) 120 MG tablet Take 1 tablet (120 mg total) by mouth daily.  . dorzolamide (TRUSOPT) 2 % ophthalmic solution Place 1 drop into both eyes 2 (two) times daily.  Marland Kitchen HYDROcodone-acetaminophen (NORCO) 7.5-325 MG tablet Take 1 tablet by mouth every 6 (six) hours as needed for severe pain. Must last 30 days  . latanoprost (XALATAN) 0.005 % ophthalmic solution Place 1 drop into both eyes at bedtime.  . lipase/protease/amylase 24000-76000 units CPEP Take 1 capsule (24,000 Units total) by mouth 3 (three) times daily before meals.  . magnesium gluconate  (MAGONATE) 500 MG tablet Take 500 mg by mouth daily.  . mirtazapine (REMERON) 7.5 MG tablet Take 1 tablet (7.5 mg total) by mouth at bedtime.  . Multiple Vitamin (MULTI-VITAMIN) tablet Take 1 tablet by mouth daily.   Marland Kitchen omeprazole (PRILOSEC) 40 MG capsule Take 1 capsule (40 mg total) by mouth daily.  . pregabalin (LYRICA) 150 MG capsule Take 1 capsule (150 mg total) by mouth every 8 (eight) hours.  . ramipril (ALTACE) 2.5 MG capsule Take 1 capsule (2.5 mg total) by mouth daily.  Marland Kitchen rOPINIRole (REQUIP) 2 MG tablet Take 1 tablet (2 mg total) by mouth 2 (two) times daily.  Marland Kitchen spironolactone (ALDACTONE) 25 MG tablet Take 1 tablet (25 mg total) by mouth daily.  . tizanidine (ZANAFLEX) 2 MG capsule Take 1 capsule (2 mg total) by mouth 3 (three) times daily as needed for muscle spasms.  . Torsemide 40 MG TABS Take 40 mg by mouth in the morning and at bedtime.  . vitamin B-12 (CYANOCOBALAMIN) 500 MCG tablet Take 1 tablet (500 mcg total) by mouth daily.   No facility-administered encounter medications on file as of 01/28/2021.    Allergies (verified) Codeine, Cyclobenzaprine, and Penicillins   History: Past Medical History:  Diagnosis Date  . Closed left arm fracture   . COPD (chronic obstructive pulmonary disease) (HCC)   . Dehydration 09/15/2020  . Glaucoma   . Hyperlipidemia   . Hypertension   . Lung mass    Past Surgical History:  Procedure Laterality Date  . BACK SURGERY    .  CARPAL TUNNEL RELEASE Left   . EYE SURGERY     Family History  Problem Relation Age of Onset  . Cancer Mother   . Cancer Father    Social History   Socioeconomic History  . Marital status: Married    Spouse name: Not on file  . Number of children: 2  . Years of education: Not on file  . Highest education level: 12th grade  Occupational History  . Occupation: disabled  . Occupation: retired  Tobacco Use  . Smoking status: Current Every Day Smoker    Packs/day: 1.50    Years: 50.00    Pack years:  75.00    Types: Cigarettes  . Smokeless tobacco: Never Used  . Tobacco comment: 1PPD 11/18/2020  Vaping Use  . Vaping Use: Former  Substance and Sexual Activity  . Alcohol use: Yes    Alcohol/week: 35.0 standard drinks    Types: 35 Cans of beer per week    Comment: sytates she quit 09/23/2020  . Drug use: No  . Sexual activity: Not Currently  Other Topics Concern  . Not on file  Social History Narrative  . Not on file   Social Determinants of Health   Financial Resource Strain: Not on file  Food Insecurity: Not on file  Transportation Needs: Not on file  Physical Activity: Not on file  Stress: Not on file  Social Connections: Not on file    Tobacco Counseling Ready to quit: Not Answered Counseling given: Not Answered Comment: 1PPD 11/18/2020   Clinical Intake:                 Diabetic? No         Activities of Daily Living In your present state of health, do you have any difficulty performing the following activities: 11/11/2020 09/15/2020  Hearing? Y N  Vision? Y N  Difficulty concentrating or making decisions? Y N  Walking or climbing stairs? Y N  Dressing or bathing? N N  Doing errands, shopping? N N  Some recent data might be hidden    Patient Care Team: Margaretann LovelessBurnette, Jennifer M, PA-C as PCP - General (Family Medicine) Wyn Quakerew, Marlow BaarsJason S, MD as Referring Physician (Vascular Surgery) Delano MetzNaveira, Francisco, MD as Referring Physician (Pain Medicine) Lockie MolaBrasington, Chadwick, MD as Referring Physician (Ophthalmology) Glory Buffhode, Hayley, RN as Oncology Nurse Navigator Salena SanerGonzalez, Carmen L, MD as Consulting Physician (Pulmonary Disease) Juanell FairlyMcCray, Felecia, RN as Registered Nurse Gaspar ColaFleury, Alexandre A, [facility] (Pharmacist)  Indicate any recent Medical Services you may have received from other than [facility] providers in the past year (date may be approximate).     Assessment:   This is a routine wellness examination for Suzanne Avila.  Hearing/Vision screen No exam data  present  Dietary issues and exercise activities discussed:    Goals    . Quit Smoking     Recommend to continue efforts to reduce smoking habits until no longer smoking (Smoking Cessation literature attached to AVS).      . Reduce alcohol intake     Recommend cutting out all alcohol. Pt declined, recommend cutting consumption in half.       Depression Screen PHQ 2/9 Scores 11/11/2020 08/07/2020 01/22/2020 06/27/2019 01/03/2019 08/02/2018 05/02/2018  PHQ - 2 Score 0 1 0 0 2 0 0  PHQ- 9 Score 0 9 - - 12 - -    Fall Risk Fall Risk  01/27/2021 11/02/2020 08/07/2020 07/27/2020 05/06/2020  Falls in the past year? 0 1 1 0 0  Number falls in  past yr: - 0 1 - -  Comment - Sep 12, 2020 - - -  Injury with Fall? - 1 0 - -  Comment - left hip was hurting - - -  Risk for fall due to : - - Impaired balance/gait - -  Follow up - - Falls evaluation completed - -    FALL RISK PREVENTION PERTAINING TO THE HOME:  Any stairs in or around the home? {YES/NO:21197} If so, are there any without handrails? {YES/NO:21197} Home free of loose throw rugs in walkways, pet beds, electrical cords, etc? Yes  Adequate lighting in your home to reduce risk of falls? Yes   ASSISTIVE DEVICES UTILIZED TO PREVENT FALLS:  Life alert? {YES/NO:21197} Use of a cane, walker or w/c? {YES/NO:21197} Grab bars in the bathroom? {YES/NO:21197} Shower chair or bench in shower? {YES/NO:21197} Elevated toilet seat or a handicapped toilet? {YES/NO:21197}  TIMED UP AND GO:  Was the test performed? {YES/NO:21197}.  Length of time to ambulate 10 feet: *** sec.   {Appearance of YTKZ:6010932}  Cognitive Function: ***     6CIT Screen 01/03/2019 01/01/2018  What Year? 0 points 0 points  What month? 0 points 0 points  What time? 0 points 0 points  Count back from 20 0 points 0 points  Months in reverse 2 points 2 points  Repeat phrase 4 points 2 points  Total Score 6 4    Immunizations Immunization History  Administered  Date(s) Administered  . Fluad Quad(high Dose 65+) 08/02/2019, 08/07/2020  . Influenza Split 07/14/2010, 08/16/2011  . Influenza, High Dose Seasonal PF 09/02/2017, 09/27/2018  . Influenza,inj,Quad PF,6+ Mos 07/09/2014  . Moderna Sars-Covid-2 Vaccination 01/02/2020, 01/30/2020, 11/03/2020  . Pneumococcal Conjugate-13 01/09/2014  . Pneumococcal Polysaccharide-23 12/01/2009, 07/24/2016  . Zoster 05/23/2014  . Zoster Recombinat (Shingrix) 09/02/2017, 01/09/2019, 08/02/2019    TDAP status: Due, Education has been provided regarding the importance of this vaccine. Advised may receive this vaccine at local pharmacy or Health Dept. Aware to provide a copy of the vaccination record if obtained from local pharmacy or Health Dept. Verbalized acceptance and understanding.  Flu Vaccine status: Up to date  Pneumococcal vaccine status: Up to date  Covid-19 vaccine status: Completed vaccines  Qualifies for Shingles Vaccine? Yes   Zostavax completed Yes   Shingrix Completed?: Yes  Screening Tests Health Maintenance  Topic Date Due  . TETANUS/TDAP  Never done  . COLONOSCOPY (Pts 45-2yrs Insurance coverage will need to be confirmed)  01/30/2016  . MAMMOGRAM  03/17/2021  . INFLUENZA VACCINE  05/24/2021  . DEXA SCAN  03/17/2022  . COVID-19 Vaccine  Completed  . Hepatitis C Screening  Completed  . PNA vac Low Risk Adult  Completed  . HPV VACCINES  Aged Out    Health Maintenance  Health Maintenance Due  Topic Date Due  . TETANUS/TDAP  Never done  . COLONOSCOPY (Pts 45-52yrs Insurance coverage will need to be confirmed)  01/30/2016    {Colorectal cancer screening:2101809}  Mammogram status: Completed 03/17/20. Repeat every year  Bone Density status: Completed 03/17/20. Results reflect: Bone density results: OSTEOPOROSIS. Repeat every 2 years.  Lung Cancer Screening: (Low Dose CT Chest recommended if Age 71-80 years, 30 pack-year currently smoking OR have quit w/in 15years.) {DOES NOT  does:27190::"does not"} qualify.   Lung Cancer Screening Referral: ***  Additional Screening:  Hepatitis C Screening: Up to date  Vision Screening: Recommended annual ophthalmology exams for early detection of glaucoma and other disorders of the eye. Is the patient up  to date with their annual eye exam?  Yes  Who is the provider or what is the name of the office in which the patient attends annual eye exams? *** If pt is not established with a provider, would they like to be referred to a provider to establish care? No .   Dental Screening: Recommended annual dental exams for proper oral hygiene  Community Resource Referral / Chronic Care Management: CRR required this visit?  No   CCM required this visit?  No      Plan:     I have personally reviewed and noted the following in the patient's chart:   . Medical and social history . Use of alcohol, tobacco or illicit drugs  . Current medications and supplements . Functional ability and status . Nutritional status . Physical activity . Advanced directives . List of other physicians . Hospitalizations, surgeries, and ER visits in previous 12 months . Vitals . Screenings to include cognitive, depression, and falls . Referrals and appointments  In addition, I have reviewed and discussed with patient certain preventive protocols, quality metrics, and best practice recommendations. A written personalized care plan for preventive services as well as general preventive health recommendations were provided to patient.     Cashus Halterman [place], [place]   06[birth year]   Nurse Notes: ***

## 2021-01-31 ENCOUNTER — Encounter: Payer: Self-pay | Admitting: Physician Assistant

## 2021-02-01 ENCOUNTER — Telehealth: Payer: Self-pay

## 2021-02-01 ENCOUNTER — Telehealth: Payer: Medicare Other

## 2021-02-01 NOTE — Telephone Encounter (Signed)
  Chronic Care Management   Outreach Note  02/01/2021 Name: Suzanne Avila MRN: [id number] DOB: [birth year]   Primary Care Provider: Erasmo Downer, MD Reason for referral : Chronic Care Management   An unsuccessful telephone outreach was attempted today. Ms. Tri was referred to the case management team for assistance with care management and care coordination.    Follow Up Plan:  Received voice prompt that member was not accepting call. Unable to leave a voice message. Will forward to the Care Guide team to assist with rescheduling.    [facility] Care Management [facility] [phone]

## 2021-02-05 ENCOUNTER — Telehealth: Payer: Self-pay

## 2021-02-05 NOTE — Progress Notes (Signed)
Chronic Care Management Pharmacy Assistant   Name: Suzanne Avila  MRN: [id number] DOB: [birth year]  Reason for Encounter: Chart Review for Clinical pharmacsit visit on 02/12/2021.    Conditions to be addressed/monitored: HTN, HLD, Hypertriglyceridemia and Chronic airway obstruction,Allergic rhinitis,CAFL,Acid reflux,Chronic low back pain,Lumbar spondylosis,Chronic pain syndrome,chronic lumbar radicular pain,Lumbar central spinal stenosis,Fibromyalgia,B12 deficiency, Vitamin D deficiency, Tobacco use disorder.,Failure to Thrive in adult,Venous insufficiency.  Primary concerns for visit include: None ID    Recent office visits:  09/23/2020 Joycelyn Man PA -start low dose Mirtazapine as below. F/U in 4 weeks. Continue diltiazem 120mg  daily and ramipril 2.5mg  daily.  10/21/2020  10/23/2020 PA -Continue mirtazapine,May take a second tab of furosemide if legs are more swollen,Continue atorvastatin ,diltiazem ,spironolactone, dorzolamide,latanoprost,omeprazole, rOPINIRole ,vitamin B-12 ,pregabalin  10/30/2020 12/28/2020 PA Change furosemide to torsemide 20-40 mg   11/11/2020 11/13/2020 PA continue torsemide  12/17/2020 12/19/2020 PA Increase torsemide to 40mg  BID,Noted cellulitis of the right lower leg secondary to swelling. Started Keflex 500 mg  01/20/2021 PA AMB Referral to [facility] Coordinaton  Recent consult visits:  08/25/2020 - Dr. [facility] MD (pain Medicine) -Sacroiliac Joint injections 09/04/2020 - Dr. Shauna Hugh MD - (Pulmonary) Started Avelox (moxifloxacin) is 1 tablet daily.  Take it in the evening before you go to bed 10 day supply.Recommend Culturelle, Ultimate Strength, 1 capsule daily while you are on the antibiotics and for a few days afterwards.Instructed to take your Prilosec (omeprazole) every day.Samples given for Breztri, a new inhaler, 2 puffs twice a day.Stop Advair. 09/04/2020- Dr.Rao MD (Oncology) 09/04/2020-  Dr.  13/09/2020 MD - (Pulmonary) Pharmacy did not have Avelox, provider change Avelox to Levaquin 500 mg p.o. daily x10 days and Flagyl 500 mg p.o. 3 times daily x10 days.  09/10/2020 - Dr.Naveira  (Pain Medicine)- Continue albuterol, atorvastatin, diltiazem, fluticasone-salmeterol, furosemide, hydrocodone-acetaminophen, hydrocodone-acetaminophen, [START ON 10/05/2020] hydrocodone-acetaminophen, linaclotide, meloxicam, omeprazole, pregabalin, ramipril, ropinirole, and spironolactone. 09/14/2020  - Dr.Naveira  (Pain Medicine) 09/22/2020- Dr. 09/16/2020 (pulmonology) Continue Augmentin,Continue Breztri 2 puffs twice a day 11/02/2020 - Dr.Naveira  (Pain Medicine) 11/10/2020 - Dr.Naveira  (Pain Medicine) Procedure Visit 11/18/2020 Dr. 11/12/2020 (pulmonology) Continue Breztri,Continue as needed albuterol 01/27/2021  Dr.Naveira  (Pain Medicine)   Hospital visits:  Medication Reconciliation was completed by comparing discharge summary, patient's EMR and Pharmacy list, and upon discussion with patient.  Admitted to the hospital on 09/14/2020 due to Right upper lobe cavitary mass/H/o smoking, Afib with RVR--resolved Dysphagia suspected due to Thrush/Esophagitis--improved Electrolyte abnormality--repelted. Discharge date was 09/17/2020. [facility].    New?Medications Started at [facility] Discharge:?? -started none  Medication Changes at Hospital Discharge: -Changed None  Medications Discontinued at Hospital Discharge: -Stopped levofloxacin 500 mg, linaclotide 145 mcg ,meloxicam 15 mg, metronidazole 500 mg,moxifloxacin 400 mg    Medications that remain the same after Hospital Discharge:??  -All other medications will remain the same.     Have you seen any other providers since your last visit? no Any changes in your medications or health? no Any side effects from any medications? no Do you have an symptoms or problems not managed by your medications? no Any  concerns about your health right now? no Has your provider asked that you check blood pressure, blood sugar, or follow special diet at home? No  Patient states she has a new blood pressure machine but it doesn't seem to work. Do you get any type of exercise on a regular basis? No  Patients states she is  Up and down all day long. Can you think of a goal you would like to reach for your health? None ID Do you have any problems getting your medications? no Is there anything that you would like to discuss during the appointment?   Patient states she has nothing in mind to discuss.  Please bring medications and supplements to appointment   Medications: Outpatient Encounter Medications as of 02/05/2021  Medication Sig Note  . albuterol (PROVENTIL) (2.5 MG/3ML) 0.083% nebulizer solution Take 3 mLs (2.5 mg total) by nebulization every 6 (six) hours as needed for wheezing or shortness of breath.   Marland Kitchen albuterol (VENTOLIN HFA) 108 (90 Base) MCG/ACT inhaler Inhale 2 puffs into the lungs every 6 (six) hours as needed for wheezing or shortness of breath.   Marland Kitchen aspirin 325 MG tablet Take 1 tablet (325 mg total) by mouth daily.   Marland Kitchen atorvastatin (LIPITOR) 40 MG tablet Take 1 tablet (40 mg total) by mouth daily.   . Budeson-Glycopyrrol-Formoterol (BREZTRI AEROSPHERE) 160-9-4.8 MCG/ACT AERO Inhale 2 puffs into the lungs in the morning and at bedtime.   Marland Kitchen diltiazem (CARDIZEM) 120 MG tablet Take 1 tablet (120 mg total) by mouth daily.   . dorzolamide (TRUSOPT) 2 % ophthalmic solution Place 1 drop into both eyes 2 (two) times daily.   Marland Kitchen HYDROcodone-acetaminophen (NORCO) 7.5-325 MG tablet Take 1 tablet by mouth every 6 (six) hours as needed for severe pain. Must last 30 days   . [START ON 03/02/2021] HYDROcodone-acetaminophen (NORCO) 7.5-325 MG tablet Take 1 tablet by mouth every 6 (six) hours as needed for severe pain. Must last 30 days   . [START ON 04/01/2021] HYDROcodone-acetaminophen (NORCO) 7.5-325 MG tablet Take 1  tablet by mouth every 6 (six) hours as needed for severe pain. Must last 30 days 01/27/2021: FUTURE Prescription. (NOT a DUPLICATE!!) >>>DO NOT DELETE<<< (even if Expired!) See Care Coordination Note from [facility] Pain Management (Dr. Laban Emperor)   . latanoprost (XALATAN) 0.005 % ophthalmic solution Place 1 drop into both eyes at bedtime.   . lipase/protease/amylase 24000-76000 units CPEP Take 1 capsule (24,000 Units total) by mouth 3 (three) times daily before meals.   . magnesium gluconate (MAGONATE) 500 MG tablet Take 500 mg by mouth daily.   . mirtazapine (REMERON) 7.5 MG tablet Take 1 tablet (7.5 mg total) by mouth at bedtime.   . Multiple Vitamin (MULTI-VITAMIN) tablet Take 1 tablet by mouth daily.    Marland Kitchen omeprazole (PRILOSEC) 40 MG capsule Take 1 capsule (40 mg total) by mouth daily.   . pregabalin (LYRICA) 150 MG capsule Take 1 capsule (150 mg total) by mouth every 8 (eight) hours.   . ramipril (ALTACE) 2.5 MG capsule Take 1 capsule (2.5 mg total) by mouth daily.   Marland Kitchen rOPINIRole (REQUIP) 2 MG tablet Take 1 tablet (2 mg total) by mouth 2 (two) times daily.   Marland Kitchen spironolactone (ALDACTONE) 25 MG tablet Take 1 tablet (25 mg total) by mouth daily.   . tizanidine (ZANAFLEX) 2 MG capsule Take 1 capsule (2 mg total) by mouth 3 (three) times daily as needed for muscle spasms.   . Torsemide 40 MG TABS Take 40 mg by mouth in the morning and at bedtime.   . vitamin B-12 (CYANOCOBALAMIN) 500 MCG tablet Take 1 tablet (500 mcg total) by mouth daily.    No facility-administered encounter medications on file as of 02/05/2021.   Albuterol 0.083% solution last filled on 10/21/2020 for 13 day supply. Albuterol 108 MCG inhaler last filled on 10/21/2020  for 50 day supply. Atorvastatin 40 mg last filled on 10/21/2020 for 90 day supply. Breztri 160-9-4.8 MCG inhaler last filled on 01/29/2021 for 90 day supply. Diltiazem 120 mg last filled on 10/21/2020 for 90 day supply. Dorzolamide 2% Ophthalmic Solution last filled on  01/29/2021 for 80 day supply. Hydrocodone-acetaminophen 7.5-325 mg last filled on 01/30/2021 for 30 day supply. Latanoprost 0.005% Ophthalmic Solution last filled on 01/11/2021 for 90 day supply. Mirtazapine 7.5 mg last filled on 01/22/2021 for 90 day supply. Omeprazole 40 mg last filled on 01/11/2021 for 90 day supply. pregabalin 150 mg last filled on 01/11/2021 for 90 day supply. Ramipril 2.5 mg last filled on 01/01/2021 for 90 day supply. Spironolactone 25 mg last filled on 10/21/2020 for 90 day supply. Tizanidine 2 mg last filled on 01/20/2021 for 30 day supply. Torsemide 40 mg last filled on 01/22/2021 for 90 day supply.   Star Rating Drugs: Atorvastatin 40 mg last filled on 10/21/2020 for 90 day supply. Ramipril 2.5 mg last filled on 01/01/2021 for 90 day supply.  Everlean Cherry Clinical Pharmacist Assistant [phone]

## 2021-02-05 NOTE — Progress Notes (Signed)
Chronic Care Management Pharmacy Note  02/18/2021 Name:  Suzanne Avila MRN:  [id number] DOB:  [birth year]  Subjective: Suzanne Avila is an 75 y.o. year old female who is a primary patient of Bacigalupo, Dionne Bucy, MD.  The CCM team was consulted for assistance with disease management and care coordination needs.    Engaged with patient by telephone for initial visit in response to provider referral for pharmacy case management and/or care coordination services.   Consent to Services:  The patient was given the following information about Chronic Care Management services today, agreed to services, and gave verbal consent: 1. CCM service includes personalized support from designated clinical staff supervised by the primary care provider, including individualized plan of care and coordination with other care providers 2. 24/7 contact phone numbers for assistance for urgent and routine care needs. 3. Service will only be billed when office clinical staff spend 20 minutes or more in a month to coordinate care. 4. Only one practitioner may furnish and bill the service in a calendar month. 5.The patient may stop CCM services at any time (effective at the end of the month) by phone call to the office staff. 6. The patient will be responsible for cost sharing (co-pay) of up to 20% of the service fee (after annual deductible is met). Patient agreed to services and consent obtained.  Patient Care Team: Virginia Crews, MD as PCP - General (Family Medicine) Lucky Cowboy, Erskine Squibb, MD as Referring Physician (Vascular Surgery) Milinda Pointer, MD as Referring Physician (Pain Medicine) Leandrew Koyanagi, MD as Referring Physician (Ophthalmology) Telford Nab, RN as Oncology Nurse Navigator Tyler Pita, MD as Consulting Physician (Pulmonary Disease) Neldon Labella, RN as Case Manager Germaine Pomfret, [facility] (Pharmacist)  Recent office visits: 01/20/2021 Fenton Malling PA AMB Referral to  [place] 12/17/2020 Fenton Malling PA Increase torsemide to 40mg  BID,Noted cellulitis of the right lower leg secondary to swelling. Started Keflex 500 mg  11/11/2020 Fenton Malling PA continue torsemide  10/30/2020 Fenton Malling PA Change furosemide to torsemide 20-40 mg   10/21/2020  Fenton Malling PA -Continue mirtazapine,May take a second tab of furosemide if legs are more swollen,Continue atorvastatin ,diltiazem ,spironolactone,dorzolamide,latanoprost,omeprazole,rOPINIRole ,vitamin B-12 ,pregabalin  09/23/2020 Fenton Malling PA -start low dose Mirtazapine as below. F/U in 4 weeks.Continue diltiazem 120mg  daily and ramipril 2.5mg  daily.  Recent consult visits: 01/27/2021  Dr.Naveira  (Pain Medicine)  11/18/2020 Dr. Dorothe Pea (pulmonology) Continue Breztri,Continue as needed albuterol 11/02/2020 - Dr.Naveira  (Pain Medicine) 11/10/2020 - Dr.Naveira  (Pain Medicine) Procedure Visit 09/22/2020- Dr. Dorothe Pea (pulmonology) Continue Augmentin,Continue Breztri 2 puffs twice a day 09/14/2020  - Dr.Naveira  (Pain Medicine) 09/10/2020 - Dr.Naveira  (Pain Medicine) 09/04/2020- Dr.Rao MD (Oncology) 09/04/2020-  Dr. Patsey Berthold MD - (Pulmonary) Pharmacy did not have Avelox, provider change Avelox to Levaquin 500 mg p.o. daily x10 days and Flagyl 500 mg p.o. 3 times daily x10 days.  09/04/2020 - Dr. Patsey Berthold MD - (Pulmonary) Started Avelox (moxifloxacin) is 1 tablet daily. Take it in the evening before you go to bed 10 day supply.Recommend Culturelle, Ultimate Strength, 1 capsule daily while you are on the antibiotics and for a few days afterwards.Instructed to take your Prilosec (omeprazole) every day.Samples given for Breztri, a new inhaler, 2 puffs twice a day.Stop Advair. 08/25/2020 - Dr. Lowella Dandy MD (pain Medicine) -Sacroiliac Joint injections  Hospital visits: Medication Reconciliation was completed by comparing discharge summary, patient's EMR and Pharmacy list, and  upon discussion with patient.  Admitted to the hospital  on 09/14/2020 due to Right upper lobe cavitary mass/H/o smoking, Afib with RVR--resolved Dysphagia suspected due to Thrush/Esophagitis--improved Electrolyte abnormality--repelted. Discharge date was 09/17/2020. Discharged from [facility]?Medications Started at [facility] Discharge:?? -started none  Medication Changes at Hospital Discharge: -Changed None  Medications Discontinued at Hospital Discharge: -Stopped levofloxacin 500 mg, linaclotide 145 mcg ,meloxicam 15 mg, metronidazole 500 mg,moxifloxacin 400 mg    Medications that remain the same after Hospital Discharge:??  -All other medications will remain the same.   Subjective: Patient feeling very off balance, stumbling. Denies dizziness.   Objective:  Lab Results  Component Value Date   CREATININE 1.03 (H) 01/20/2021   BUN 25 01/20/2021   GFRNONAA 54 (L) 11/11/2020   GFRAA 62 11/11/2020   NA 139 01/20/2021   K 4.5 01/20/2021   CALCIUM 10.1 01/20/2021   CO2 26 01/20/2021   GLUCOSE 82 01/20/2021    No results found for: HGBA1C, FRUCTOSAMINE, GFR, MICROALBUR  Last diabetic Eye exam: No results found for: HMDIABEYEEXA  Last diabetic Foot exam: No results found for: HMDIABFOOTEX   Lab Results  Component Value Date   CHOL 154 02/03/2020   HDL 70 02/03/2020   LDLCALC 64 02/03/2020   TRIG 117 02/03/2020   CHOLHDL 2.6 01/09/2019    Hepatic Function Latest Ref Rng & Units 01/20/2021 11/11/2020 09/16/2020  Total Protein 6.0 - 8.5 g/dL 7.1 6.9 4.9(L)  Albumin 3.7 - 4.7 g/dL 4.1 4.3 2.1(L)  AST 0 - 40 IU/L $Remov'16 15 18  'bdblLg$ ALT 0 - 32 IU/L $Remov'8 8 13  'miAOQs$ Alk Phosphatase 44 - 121 IU/L 217(H) 125(H) 54  Total Bilirubin 0.0 - 1.2 mg/dL 0.3 0.3 0.6    Lab Results  Component Value Date/Time   TSH 1.110 06/14/2016 09:13 AM    CBC Latest Ref Rng & Units 01/20/2021 09/15/2020 09/14/2020  WBC 3.4 - 10.8 x10E3/uL 10.6 12.5(H) 12.4(H)   Hemoglobin 11.1 - 15.9 g/dL 13.5 9.9(L) 12.1  Hematocrit 34.0 - 46.6 % 39.9 27.5(L) 33.9(L)  Platelets 150 - 450 x10E3/uL 397 321 378    Lab Results  Component Value Date/Time   VD25OH 45.9 02/03/2020 11:12 AM   VD25OH 31.8 01/01/2018 10:46 AM    Clinical ASCVD: No  The 10-year ASCVD risk score Mikey Bussing DC Jr., et al., 2013) is: 24%   Values used to calculate the score:     Age: 62 years     Sex: Female     Is Non-Hispanic African American: No     Diabetic: No     Tobacco smoker: Yes     Systolic Blood Pressure: 481 mmHg     Is BP treated: Yes     HDL Cholesterol: 70 mg/dL     Total Cholesterol: 154 mg/dL    Depression screen [facility] 2/9 11/11/2020 08/07/2020 01/22/2020  Decreased Interest 0 0 0  Down, Depressed, Hopeless 0 1 0  PHQ - 2 Score 0 1 0  Altered sleeping 0 1 -  Tired, decreased energy 0 2 -  Change in appetite 0 3 -  Feeling bad or failure about yourself  0 0 -  Trouble concentrating 0 2 -  Moving slowly or fidgety/restless 0 0 -  Suicidal thoughts 0 0 -  PHQ-9 Score 0 9 -  Difficult doing work/chores Not difficult at all Somewhat difficult -    Social History   Tobacco Use  Smoking Status Current Every Day Smoker  . Packs/day: 1.50  . Years: 50.00  .  Pack years: 75.00  . Types: Cigarettes  Smokeless Tobacco Never Used  Tobacco Comment   1PPD 11/18/2020   BP Readings from Last 3 Encounters:  01/27/21 (!) 115/50  01/20/21 (!) 175/90  12/17/20 (!) 165/81   Pulse Readings from Last 3 Encounters:  01/27/21 79  01/20/21 77  12/17/20 75   Wt Readings from Last 3 Encounters:  01/27/21 93 lb (42.2 kg)  01/20/21 93 lb 6.4 oz (42.4 kg)  12/17/20 104 lb (47.2 kg)   BMI Readings from Last 3 Encounters:  01/27/21 18.78 kg/m  01/20/21 18.86 kg/m  12/17/20 21.01 kg/m    Assessment/Interventions: Review of patient past medical history, allergies, medications, health status, including review of consultants reports, laboratory and other test data, was  performed as part of comprehensive evaluation and provision of chronic care management services.   SDOH:  (Social Determinants of Health) assessments and interventions performed: Yes SDOH Interventions   Flowsheet Row Most Recent Value  SDOH Interventions   Financial Strain Interventions Intervention Not Indicated     SDOH Screenings   Alcohol Screen: Low Risk   . Last Alcohol Screening Score (AUDIT): 0  Depression (PHQ2-9): Low Risk   . PHQ-2 Score: 0  Financial Resource Strain: Low Risk   . Difficulty of Paying Living Expenses: Not hard at all  Food Insecurity: Not on file  Housing: Not on file  Physical Activity: Not on file  Social Connections: Not on file  Stress: Not on file  Tobacco Use: High Risk  . Smoking Tobacco Use: Current Every Day Smoker  . Smokeless Tobacco Use: Never Used  Transportation Needs: Not on file    CCM Care Plan  Allergies  Allergen Reactions  . Codeine Itching  . Cyclobenzaprine Itching  . Penicillins     Itching without rash per patient.  Unable to say when it happened, other than "a long time ago"    Medications Reviewed Today    Reviewed by Germaine Pomfret, [facility] (Pharmacist) on 02/12/21 at 1139  Med List Status: <None>  Medication Order Taking? Sig Documenting Provider Last Dose Status Informant  albuterol (PROVENTIL) (2.5 MG/3ML) 0.083% nebulizer solution [id number] Yes Take 3 mLs (2.5 mg total) by nebulization every 6 (six) hours as needed for wheezing or shortness of breath. Fenton Malling M, PA-C Taking Active   albuterol (VENTOLIN HFA) 108 (90 Base) MCG/ACT inhaler [id number] Yes Inhale 2 puffs into the lungs every 6 (six) hours as needed for wheezing or shortness of breath. Tyler Pita, MD Taking Active   aspirin 325 MG tablet [id number] Yes Take 1 tablet (325 mg total) by mouth daily. Mar Daring, PA-C Taking Active Self  atorvastatin (LIPITOR) 40 MG tablet [id number] Yes Take 1 tablet (40 mg total) by mouth daily.  Mar Daring, PA-C Taking Active   Budeson-Glycopyrrol-Formoterol (BREZTRI AEROSPHERE) 160-9-4.8 MCG/ACT AERO [id number] Yes Inhale 2 puffs into the lungs in the morning and at bedtime. Tyler Pita, MD Taking Active   diltiazem (CARDIZEM) 120 MG tablet [id number] Yes Take 1 tablet (120 mg total) by mouth daily. Fenton Malling M, PA-C Taking Active   dorzolamide (TRUSOPT) 2 % ophthalmic solution [id number] Yes Place 1 drop into both eyes 2 (two) times daily. Mar Daring, PA-C Taking Active   HYDROcodone-acetaminophen (NORCO) 7.5-325 MG tablet [id number] Yes Take 1 tablet by mouth every 6 (six) hours as needed for severe pain. Must last 30 days Milinda Pointer, MD Taking Active   HYDROcodone-acetaminophen [facility]) 7.5-325 MG tablet [id number]  Take 1 tablet by mouth every 6 (six) hours as needed for severe pain. Must last 30 days Milinda Pointer, MD  Active   HYDROcodone-acetaminophen [facility]) 7.5-325 MG tablet [id number]  Take 1 tablet by mouth every 6 (six) hours as needed for severe pain. Must last 30 days Milinda Pointer, MD  Active            Med Note Dossie Arbour, [place] A   Wed Jan 27, 2021 12:20 PM) FUTURE Prescription. (NOT a DUPLICATE!!) >>>DO NOT DELETE<<< (even if Expired!) See Care Coordination Note from [facility] (Dr. Dossie Arbour)   latanoprost (XALATAN) 0.005 % ophthalmic solution [id number] Yes Place 1 drop into both eyes at bedtime. Rubye Beach Taking Active   lipase/protease/amylase 24000-76000 units CPEP [id number]  Take 1 capsule (24,000 Units total) by mouth 3 (three) times daily before meals. Fritzi Mandes, MD  Active   magnesium gluconate (MAGONATE) 500 MG tablet [id number] Yes Take 500 mg by mouth daily. [provider] Taking Active Self  mirtazapine (REMERON) 7.5 MG tablet [id number] Yes Take 1 tablet (7.5 mg total) by mouth at bedtime. Mar Daring, PA-C Taking Active   Multiple Vitamin (MULTI-VITAMIN) tablet [id number]  Yes Take 1 tablet by mouth daily.  [provider] Taking Active Self  omeprazole (PRILOSEC) 40 MG capsule [id number] Yes Take 1 capsule (40 mg total) by mouth daily. Mar Daring, PA-C Taking Active   pregabalin (LYRICA) 150 MG capsule [id number] Yes Take 1 capsule (150 mg total) by mouth every 8 (eight) hours. Mar Daring, PA-C Taking Active   ramipril (ALTACE) 2.5 MG capsule [id number] Yes Take 1 capsule (2.5 mg total) by mouth daily. Fenton Malling M, PA-C Taking Active   rOPINIRole (REQUIP) 2 MG tablet [id number] Yes Take 1 tablet (2 mg total) by mouth 2 (two) times daily.  Patient taking differently: Take 2 mg by mouth at bedtime.   Mar Daring, PA-C Taking Active   spironolactone (ALDACTONE) 25 MG tablet [id number] Yes Take 1 tablet (25 mg total) by mouth daily. Mar Daring, PA-C Taking Active   tizanidine (ZANAFLEX) 2 MG capsule [id number] Yes Take 1 capsule (2 mg total) by mouth 3 (three) times daily as needed for muscle spasms. Rubye Beach Taking Active   Torsemide 40 MG TABS [id number] Yes Take 40 mg by mouth in the morning and at bedtime. Mar Daring, PA-C Taking Active   vitamin B-12 (CYANOCOBALAMIN) 500 MCG tablet [id number] Yes Take 1 tablet (500 mcg total) by mouth daily. Mar Daring, PA-C Taking Active   Med List Note Dewayne Shorter, RN 01/27/21 1221): MR 05-01-2021 UDS 08/02/18          Patient Active Problem List   Diagnosis Date Noted  . Chronic use of opiate for therapeutic purpose 01/26/2021  . Avascular necrosis of femoral head (Right) ([place]) 11/02/2020  . Avascular necrosis of femoral head (Left) ([place]) 11/02/2020  . Cavitary lesion of lung 09/15/2020  . AF (paroxysmal atrial fibrillation) ([place]) 09/15/2020  . Failure to thrive in adult 09/15/2020  . Severe protein-calorie malnutrition Altamease [place]: less than 60% of standard weight) ([place]) 09/15/2020  . Dysphagia 09/15/2020  . Hypotension 09/15/2020  .  Abscess of right lung with pneumonia ([place]) 09/14/2020  . Chronic low back pain (Left) w/o sciatica 09/10/2020  . Pharmacologic therapy 05/05/2020  . Chronic diarrhea 05/04/2020  . DDD (degenerative disc disease), lumbosacral 11/19/2019  . Therapeutic opioid-induced constipation (OIC) 08/01/2019  . Chronic sacroiliac joint pain (Left)  05/01/2019  . Chronic groin pain (Left) 05/01/2019  . Spondylosis without myelopathy or radiculopathy, lumbosacral region 05/01/2019  . Other specified dorsopathies, sacral and sacrococcygeal region 05/01/2019  . Renal artery stenosis ([place]) 12/27/2017  . Pain in limb 12/22/2017  . Tobacco use disorder 12/22/2017  . Renal atrophy, right 12/22/2017  . Thoracic radiculitis 01/31/2017  . Chronic lower extremity pain (Primary Area of Pain) (Bilateral) (L>R) 04/25/2016  . Chronic upper extremity pain (Third area of Pain) (Bilateral) (L>R) 04/25/2016  . Disturbance of skin sensation 04/20/2016  . Neurogenic pain 04/20/2016  . Chronic hip pain (Bilateral) (L>R) 11/11/2015  . Vitamin D deficiency 11/11/2015  . Edema 09/02/2015  . 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 Encounter for therapeutic drug level monitoring 08/14/2015  . Long term current use of opiate analgesic 08/14/2015  . Long term prescription opiate use 08/14/2015  . Uncomplicated opioid dependence ([place]) 08/14/2015  . Opiate use (30 MME/Day) 08/14/2015  . Chronic low back pain (2ry area of Pain) (Bilateral) (L>R) w/ sciatica (Bilateral) 08/14/2015  . Chronic pain syndrome 08/14/2015  . Alcoholic ([place]) 21/30/8657  . B12 deficiency 08/14/2015  . Narrowing of intervertebral disc space 08/14/2015  . Encounter for screening for diabetes mellitus 08/14/2015  . History of metabolic disorder 84/69/6295  . Fungal infection of toenail 08/14/2015  . Osteopenia 08/14/2015  . Compulsive tobacco user syndrome 08/14/2015  . Osteoarthritis of hip (Left) 08/14/2015  . Chronic hip pain (Left)  08/14/2015  . Chronic lumbar radicular pain (L5 dermatome) (Bilateral) (L>R) 08/14/2015  . Lumbar facet syndrome (Bilateral) (L>R) 08/14/2015  . Lumbar spondylosis 08/14/2015  . 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 Lumbar Postlaminectomy syndrome (L4-5) 08/14/2015  . Fibromyalgia 08/14/2015  . Chronic airway obstruction ([place]) 04/21/2015  . Allergic rhinitis 02/10/2010  . Atrial paroxysmal tachycardia ([place]) 07/06/2009  . CAFL (chronic airflow limitation) ([place]) 07/06/2009  . Acid reflux 07/06/2009  . Glaucoma 07/06/2009  . Essential hypertension 07/06/2009  . Hypercholesterolemia without hypertriglyceridemia 07/06/2009  . Absence of bladder continence 07/06/2009    Immunization History  Administered Date(s) Administered  . Fluad Quad(high Dose 65+) 08/02/2019, 08/07/2020  . Influenza Split 07/14/2010, 08/16/2011  . Influenza, High Dose Seasonal PF 09/02/2017, 09/27/2018  . Influenza,inj,Quad PF,6+ Mos 07/09/2014  . Moderna Sars-Covid-2 Vaccination 01/02/2020, 01/30/2020, 11/03/2020  . Pneumococcal Conjugate-13 01/09/2014  . Pneumococcal Polysaccharide-23 12/01/2009, 07/24/2016  . Zoster 05/23/2014  . Zoster Recombinat (Shingrix) 09/02/2017, 01/09/2019, 08/02/2019    Conditions to be addressed/monitored:  Hypertension, Hyperlipidemia, Atrial Fibrillation, GERD, COPD, Osteopenia, Tobacco use and Chronic Pain  Care Plan : General Pharmacy (Adult)  Updates made by Germaine Pomfret, RPH since 02/18/2021 12:00 AM    Problem: Hypertension, Hyperlipidemia, Atrial Fibrillation, GERD, COPD, Osteopenia, Tobacco use and Chronic Pain   Priority: High    Long-Range Goal: Patient-Specific Goal   Start Date: 02/05/2021  Expected End Date: 08/20/2021  This Visit's Progress: On track  Priority: High  Note:   Current Barriers:  Unable to maintain control of blood pressure  Pharmacist Clinical Goal(s):  Patient will achieve control of blood pressure as evidenced  by BP less than 140/90 through collaboration with PharmD and provider.   Interventions: 1:1 collaboration with Brita Romp Dionne Bucy, MD regarding development and update of comprehensive plan of care as evidenced by provider attestation and co-signature Inter-disciplinary care team collaboration (see longitudinal plan of care) Comprehensive medication review performed; medication list updated in electronic medical record  Hypertension (BP goal <140/90) -Uncontrolled -Current treatment:  Diltiazem 120 mg daily  Ramipril 2.5 mg daily  Spironolactone 25 mg daily  Torsemide 40 mg twice daily  -Medications previously tried: NA  -Current home readings: NA -Denies hypotensive/hypertensive symptoms -Educated on Daily salt intake goal < 2300 mg; Importance of home blood pressure monitoring; -Counseled to monitor BP at home weekly, document, and provide log at future appointments -Recommended to continue current medication  Hyperlipidemia: (LDL goal < 70) -Controlled -Current treatment: Atorvastatin 40 mg daily  -Medications previously tried: NA  -Educated on Importance of limiting foods high in cholesterol; -Recommended to continue current medication  Atrial Fibrillation (Goal: prevent stroke and major bleeding) -Controlled -CHADSVASC: 4 -Current treatment: Rate control: Diltiazem 120 mg daily  Anticoagulation: None, patient waiting for bronchoscopy -Medications previously tried: NA -Counseled on increased risk of stroke due to Afib and benefits of anticoagulation for stroke prevention; -Recommended to continue current medication  Chronic Airway Obstruction (Goal: control symptoms and prevent exacerbations) -Controlled -Current treatment  Proventil 0.083% Soln. 3 mL every 6 hours as needed  Ventolin HFA 2 puffs every 6 hours as needed Breztri 2 puffs twice daily (sometimes taking three times daily PRN) -Medications previously tried: NA  -Pulmonary function testing: FEV1 66%,  FEV1/FVC 75% (Feb 2022) -Exacerbations requiring treatment in last 6 months: No -Patient denies consistent use of maintenance inhaler -Frequency of rescue inhaler use: Very seldom,  -Counseled on Benefits of consistent maintenance inhaler use When to use rescue inhaler -Recommended to continue current medication  Tobacco use (Goal Quit Smoking) -Uncontrolled -Previous quit attempts: NA -Current treatment  1 ppd  -Patient smokes Within 30 minutes of waking -Patient triggers include: stress, anxiety and boredom  and finishing a meal -Patient is currently in a pre-contemplative stage of change  -Provided contact information for [place] Quit Line (1-800-QUIT-NOW) and encouraged patient to reach out to this group for support. -Recommended to continue current medication  Chronic Pain (Goal: Maintain adequate pain control) -Controlled -Current treatment  Hydrocodone 7.5-325 mg every 6 hours  Pregabalin 150 mg every 8 hours  Tizanidine 2 mg three times daily  -Medications previously tried: NA  - Spasms in fingers, tizanidine does not provide relief.  -Recommended to continue current medication  GERD (Goal: Prevent heartburn or reflux symptoms) -Controlled -Current treatment  Omeprazole 40 mg daily  -Medications previously tried: NA  -Previously had dysphagia, much improved.  -Sometimes skips if symptoms well controlled  -Recommended to continue current medication   Patient Goals/Self-Care Activities Patient will:  - check blood pressure weekly, document, and provide at future appointments  Follow Up Plan: Telephone follow up appointment with care management team member scheduled for:  05/07/2021 at 11:00 AM      Medication Assistance: None required.  Patient affirms current coverage meets needs.  Patient's preferred pharmacy is:  [facility], [place] [address] Phone: [phone] Fax: [phone]  [facility] [place], [place] [address] [place] [address] Phone: [phone] Fax: [phone]  Uses pill box? Yes Pt endorses 100% compliance  We discussed: Current pharmacy is preferred with insurance plan and patient is satisfied with pharmacy services Patient decided to: Continue current medication management strategy  Care Plan and Follow Up Patient Decision:  Patient agrees to Care Plan and Follow-up.  Plan: Telephone follow up appointment with care management team member scheduled for:  05/07/2021 at 11:00 AM  Junius Argyle, PharmD, [facility] [phone]

## 2021-02-08 ENCOUNTER — Telehealth: Payer: Self-pay | Admitting: *Deleted

## 2021-02-08 NOTE — Chronic Care Management (AMB) (Signed)
  Care Management   Note  02/08/2021 Name: Suzanne Avila MRN: [id number] DOB: [birth year]  Suzanne Avila is a 75 y.o. year old female who is a primary care patient of Beryle Flock, Marzella Schlein, MD and is actively engaged with the care management team. I reached out to [name] by phone today to assist with re-scheduling an initial visit with the RN Case Manager  Follow up plan: Unsuccessful telephone outreach attempt made The care management team will reach out to the patient again over the next 5 days.  If patient returns call to provider office, please advise to call Embedded Care Management Care Guide Rochella Benner at [phone]  Taleyah Hillman [facility] Guide, Embedded Care Coordination [facility]  Care Management

## 2021-02-11 NOTE — Chronic Care Management (AMB) (Signed)
  Care Management   Note  02/11/2021 Name: GESSICA JAWAD MRN: [id number] DOB: [birth year]  CAMRIN LAPRE is a 75 y.o. year old female who is a primary care patient of Beryle Flock, Marzella Schlein, MD and is actively engaged with the care management team. I reached out to [name] by phone today to assist with re-scheduling an initial visit with the RN Case Manager  Follow up plan: A second unsuccessful telephone outreach attempt made. A HIPAA compliant phone message was left for the patient providing contact information and requesting a return call.  The care management team will reach out to the patient again over the next 5 days.  If patient returns call to provider office, please advise to call Embedded Care Management Care Guide Lavergne Hiltunen at [phone]  Leighann Amadon [facility] Guide, Embedded Care Coordination [facility]  Care Management

## 2021-02-12 ENCOUNTER — Telehealth: Payer: Self-pay | Admitting: *Deleted

## 2021-02-12 ENCOUNTER — Ambulatory Visit (INDEPENDENT_AMBULATORY_CARE_PROVIDER_SITE_OTHER): Payer: Medicare Other

## 2021-02-12 DIAGNOSIS — E7800 Pure hypercholesterolemia, unspecified: Secondary | ICD-10-CM | POA: Diagnosis not present

## 2021-02-12 DIAGNOSIS — I480 Paroxysmal atrial fibrillation: Secondary | ICD-10-CM

## 2021-02-12 DIAGNOSIS — I10 Essential (primary) hypertension: Secondary | ICD-10-CM

## 2021-02-12 DIAGNOSIS — J449 Chronic obstructive pulmonary disease, unspecified: Secondary | ICD-10-CM | POA: Diagnosis not present

## 2021-02-12 DIAGNOSIS — F17200 Nicotine dependence, unspecified, uncomplicated: Secondary | ICD-10-CM

## 2021-02-12 NOTE — Chronic Care Management (AMB) (Signed)
Signe Colt opened under wrong practice.  Gwenevere Ghazi  Care Guide, Embedded Care Coordination [facility]  [place], [place] [address] Direct Dial: [phone] [email or web address]: [email or web address]

## 2021-02-12 NOTE — Chronic Care Management (AMB) (Signed)
  Care Management   Note  02/12/2021 Name: Suzanne Avila MRN: [id number] DOB: 1946-06-01  Suzanne Avila is a 75 y.o. year old female who is a primary care patient of Beryle Flock, Marzella Schlein, MD and is actively engaged with the care management team. I reached out to [name] by phone today to assist with re-scheduling an initial visit with the RN Case Manager  Follow up plan: Telephone appointment with care management team member scheduled for:03/05/2021  [facility] Guide, Embedded Care Coordination [facility] Management

## 2021-02-18 NOTE — Patient Instructions (Signed)
Visit Information It was great speaking with you today!  Please let me know if you have any questions about our visit.  Goals Addressed            This Visit's Progress   . Quit Smoking   Not on track    Recommend to continue efforts to reduce smoking habits until no longer smoking (Smoking Cessation literature attached to AVS).      . Track and Manage My Blood Pressure-Hypertension       Timeframe:  Long-Range Goal Priority:  High Start Date:  02/05/2021                            Expected End Date: 08/07/2021                      Follow Up Date 04/22/2021   - check blood pressure weekly    Why is this important?    You won't feel high blood pressure, but it can still hurt your blood vessels.   High blood pressure can cause heart or kidney problems. It can also cause a stroke.   Making lifestyle changes like losing a little weight or eating less salt will help.   Checking your blood pressure at home and at different times of the day can help to control blood pressure.   If the doctor prescribes medicine remember to take it the way the doctor ordered.   Call the office if you cannot afford the medicine or if there are questions about it.     Notes:        Patient Care Plan: General Pharmacy (Adult)    Problem Identified: Hypertension, Hyperlipidemia, Atrial Fibrillation, GERD, COPD, Osteopenia, Tobacco use and Chronic Pain   Priority: High    Long-Range Goal: Patient-Specific Goal   Start Date: 02/05/2021  Expected End Date: 08/20/2021  This Visit's Progress: On track  Priority: High  Note:   Current Barriers:  Unable to maintain control of blood pressure  Pharmacist Clinical Goal(s):  Patient will achieve control of blood pressure as evidenced by BP less than 140/90 through collaboration with PharmD and provider.   Interventions: 1:1 collaboration with Erasmo Downer, MD regarding development and update of comprehensive plan of care as evidenced by  provider attestation and co-signature Inter-disciplinary care team collaboration (see longitudinal plan of care) Comprehensive medication review performed; medication list updated in electronic medical record  Hypertension (BP goal <140/90) -Uncontrolled -Current treatment: Diltiazem 120 mg daily  Ramipril 2.5 mg daily  Spironolactone 25 mg daily  Torsemide 40 mg twice daily  -Medications previously tried: NA  -Current home readings: NA -Denies hypotensive/hypertensive symptoms -Educated on Daily salt intake goal < 2300 mg; Importance of home blood pressure monitoring; -Counseled to monitor BP at home weekly, document, and provide log at future appointments -Recommended to continue current medication  Hyperlipidemia: (LDL goal < 70) -Controlled -Current treatment: Atorvastatin 40 mg daily  -Medications previously tried: NA  -Educated on Importance of limiting foods high in cholesterol; -Recommended to continue current medication  Atrial Fibrillation (Goal: prevent stroke and major bleeding) -Controlled -CHADSVASC: 4 -Current treatment: Rate control: Diltiazem 120 mg daily  Anticoagulation: None, patient waiting for bronchoscopy -Medications previously tried: NA -Counseled on increased risk of stroke due to Afib and benefits of anticoagulation for stroke prevention; -Recommended to continue current medication  Chronic Airway Obstruction (Goal: control symptoms and prevent exacerbations) -Controlled -Current treatment  Proventil  0.083% Soln. 3 mL every 6 hours as needed  Ventolin HFA 2 puffs every 6 hours as needed Breztri 2 puffs twice daily (sometimes taking three times daily PRN) -Medications previously tried: NA  -Pulmonary function testing: FEV1 66%, FEV1/FVC 75% (Feb 2022) -Exacerbations requiring treatment in last 6 months: No -Patient denies consistent use of maintenance inhaler -Frequency of rescue inhaler use: Very seldom,  -Counseled on Benefits of consistent  maintenance inhaler use When to use rescue inhaler -Recommended to continue current medication  Tobacco use (Goal Quit Smoking) -Uncontrolled -Previous quit attempts: NA -Current treatment  1 ppd  -Patient smokes Within 30 minutes of waking -Patient triggers include: stress, anxiety and boredom  and finishing a meal -Patient is currently in a pre-contemplative stage of change  -Provided contact information for [place] Quit Line (1-800-QUIT-NOW) and encouraged patient to reach out to this group for support. -Recommended to continue current medication  Chronic Pain (Goal: Maintain adequate pain control) -Controlled -Current treatment  Hydrocodone 7.5-325 mg every 6 hours  Pregabalin 150 mg every 8 hours  Tizanidine 2 mg three times daily  -Medications previously tried: NA  - Spasms in fingers, tizanidine does not provide relief.  -Recommended to continue current medication  GERD (Goal: Prevent heartburn or reflux symptoms) -Controlled -Current treatment  Omeprazole 40 mg daily  -Medications previously tried: NA  -Previously had dysphagia, much improved.  -Sometimes skips if symptoms well controlled  -Recommended to continue current medication   Patient Goals/Self-Care Activities Patient will:  - check blood pressure weekly, document, and provide at future appointments  Follow Up Plan: Telephone follow up appointment with care management team member scheduled for:  05/07/2021 at 11:00 AM       Ms. Michalsky was given information about Chronic Care Management services today including:  1. CCM service includes personalized support from designated clinical staff supervised by her physician, including individualized plan of care and coordination with other care providers 2. 24/7 contact phone numbers for assistance for urgent and routine care needs. 3. Standard insurance, coinsurance, copays and deductibles apply for chronic care management only during months in which we provide at  least 20 minutes of these services. Most insurances cover these services at 100%, however patients may be responsible for any copay, coinsurance and/or deductible if applicable. This service may help you avoid the need for more expensive face-to-face services. 4. Only one practitioner may furnish and bill the service in a calendar month. 5. The patient may stop CCM services at any time (effective at the end of the month) by phone call to the office staff.  Patient agreed to services and verbal consent obtained.   The patient verbalized understanding of instructions, educational materials, and care plan provided today and declined offer to receive copy of patient instructions, educational materials, and care plan.   Angelena Sole, PharmD, CPP Clinical Pharmacist [facility] [phone]

## 2021-03-01 ENCOUNTER — Telehealth: Payer: Medicare Other

## 2021-03-02 ENCOUNTER — Ambulatory Visit: Admission: RE | Admit: 2021-03-02 | Payer: Medicare Other | Source: Ambulatory Visit

## 2021-03-04 ENCOUNTER — Ambulatory Visit: Payer: Medicare Other | Admitting: Family Medicine

## 2021-03-05 ENCOUNTER — Telehealth: Payer: Medicare Other

## 2021-03-05 ENCOUNTER — Telehealth: Payer: Self-pay

## 2021-03-05 NOTE — Telephone Encounter (Signed)
  Care Management    03/05/2021 Name: Suzanne Avila MRN: [id number] DOB: 06-Dec-1945   Primary Care Provider: Erasmo Downer, MD Reason for referral : Chronic Care Management   A second unsuccessful telephone outreach was attempted today. The patient was referred to the case management team for assistance with care management and care coordination.      PLAN Will notify the Care Guide team to assist with rescheduling.    [facility] Care Management [facility] [phone]

## 2021-03-10 ENCOUNTER — Other Ambulatory Visit: Payer: Self-pay | Admitting: Physician Assistant

## 2021-03-10 DIAGNOSIS — R252 Cramp and spasm: Secondary | ICD-10-CM

## 2021-03-10 NOTE — Telephone Encounter (Signed)
Requested medication (s) are due for refill today: no  Requested medication (s) are on the active medication list: yes  Last refill:  01/20/2021  Future visit scheduled:no  Notes to clinic:  this refill cannot be delegated    Requested Prescriptions  Pending Prescriptions Disp Refills   tiZANidine (ZANAFLEX) 2 MG tablet [Pharmacy Med Name: TIZANIDINE HCL 2 MG TAB] 90 tablet     Sig: TAKE 1 TABLET BY MOUTH 3 TIMES A DAY AS NEEDED FOR MUSCLE SPASMS.      Not Delegated - Cardiovascular:  Alpha-2 Agonists - tizanidine Failed - 03/10/2021 12:25 PM      Failed - This refill cannot be delegated      Passed - Valid encounter within last 6 months    Recent Outpatient Visits           1 month ago Localized swelling of right lower leg   [facility] [place], [place], [place]   2 months ago Leg swelling   [facility] [place], [place], [place]   3 months ago Leg swelling   [facility] [place], [place], [place]   4 months ago Leg swelling   [facility] [place], [place], [place]   4 months ago FTT (failure to thrive) in adult   [facility], [place], PA-C       Future Appointments             In 1 month Salena Saner, MD [facility] [place]

## 2021-03-29 ENCOUNTER — Ambulatory Visit (INDEPENDENT_AMBULATORY_CARE_PROVIDER_SITE_OTHER): Payer: Medicare Other

## 2021-03-29 DIAGNOSIS — I10 Essential (primary) hypertension: Secondary | ICD-10-CM

## 2021-03-29 DIAGNOSIS — E7800 Pure hypercholesterolemia, unspecified: Secondary | ICD-10-CM | POA: Diagnosis not present

## 2021-03-29 NOTE — Chronic Care Management (AMB) (Signed)
  Care Management      03/29/2021 Name: BRIGIDA SCOTTI MRN: [id number] DOB: 1945-11-16   Primary Care Provider: Erasmo Downer, MD Reason for referral : Chronic Care Management   Ms. Laux was referred to the care management team for assistance with chronic care management and care coordination. Her primary care provider will be notified of our unsuccessful attempts to establish contact. The care management team will gladly outreach at any time in the future if she is interested in receiving nurse care management assistance.    Follow Up Plan:  Will gladly follow up with Ms. Labarbera after the primary care provider has a conversation with her regarding recommendation for nurse care management engagement.    [facility] Care Management [facility] [phone]

## 2021-03-31 ENCOUNTER — Telehealth: Payer: Self-pay | Admitting: Family Medicine

## 2021-03-31 DIAGNOSIS — M7989 Other specified soft tissue disorders: Secondary | ICD-10-CM

## 2021-03-31 DIAGNOSIS — M797 Fibromyalgia: Secondary | ICD-10-CM

## 2021-03-31 DIAGNOSIS — R627 Adult failure to thrive: Secondary | ICD-10-CM

## 2021-03-31 DIAGNOSIS — I701 Atherosclerosis of renal artery: Secondary | ICD-10-CM

## 2021-03-31 DIAGNOSIS — I10 Essential (primary) hypertension: Secondary | ICD-10-CM

## 2021-03-31 DIAGNOSIS — H409 Unspecified glaucoma: Secondary | ICD-10-CM

## 2021-03-31 DIAGNOSIS — K219 Gastro-esophageal reflux disease without esophagitis: Secondary | ICD-10-CM

## 2021-03-31 DIAGNOSIS — E7800 Pure hypercholesterolemia, unspecified: Secondary | ICD-10-CM

## 2021-03-31 NOTE — Telephone Encounter (Signed)
Patient was not due for Mirtazapine, Omeprazole, Ramipril,Ropinerole or Tizanidine  She was last seen by [facility] 01/20/21

## 2021-03-31 NOTE — Telephone Encounter (Signed)
Medication Refill - Medication:  atorvastatin (LIPITOR) 40 MG tablet 3 Budeson-Glycopyrrol-Formoterol (BREZTRI AEROSPHERE) 160-9-4.8 MCG/ACT AERO [[id number] dorzolamide (TRUSOPT) 2 % ophthalmic solution latanoprost (XALATAN) 0.005 % ophthalmic solution  mirtazapine (REMERON) 7.5 MG tablet  omeprazole (PRILOSEC) 40 MG capsule  pregabalin (LYRICA) 150 MG capsule  ramipril (ALTACE) 2.5 MG capsule  rOPINIRole (REQUIP) 2 MG tablet  spironolactone (ALDACTONE) 25 MG tablet  tizanidine (ZANAFLEX) 2 MG capsule  Torsemide 40 MG TABS   Has the patient contacted their pharmacy? No, patient haven't had medication refilled in a while  Preferred Pharmacy (with phone number or street name):  [facility] - [place], [place] - [address] [place] [address] Phone: [phone] Fax: [phone] Hours: Not open 24 hours  Agent: Please be advised that RX refills may take up to 3 business days. We ask that you follow-up with your pharmacy.

## 2021-04-01 MED ORDER — ATORVASTATIN CALCIUM 40 MG PO TABS: 40.0000 mg | ORAL_TABLET | Freq: Every day | ORAL | 1 refills | Status: DC

## 2021-04-01 MED ORDER — BREZTRI AEROSPHERE 160-9-4.8 MCG/ACT IN AERO: 2.0000 | INHALATION_SPRAY | Freq: Two times a day (BID) | RESPIRATORY_TRACT | 3 refills | Status: DC

## 2021-04-01 MED ORDER — DORZOLAMIDE HCL 2 % OP SOLN: 1.0000 [drp] | Freq: Two times a day (BID) | OPHTHALMIC | 5 refills | Status: AC

## 2021-04-01 MED ORDER — PREGABALIN 150 MG PO CAPS: 150.0000 mg | ORAL_CAPSULE | Freq: Three times a day (TID) | ORAL | 1 refills | Status: DC

## 2021-04-01 MED ORDER — LATANOPROST 0.005 % OP SOLN: 1.0000 [drp] | Freq: Every day | OPHTHALMIC | 3 refills | Status: AC

## 2021-04-01 MED ORDER — SPIRONOLACTONE 25 MG PO TABS: 25.0000 mg | ORAL_TABLET | Freq: Every day | ORAL | 1 refills | Status: DC

## 2021-04-01 MED ORDER — TORSEMIDE 40 MG PO TABS: 40.0000 mg | ORAL_TABLET | Freq: Two times a day (BID) | ORAL | 1 refills | Status: DC

## 2021-04-02 NOTE — Telephone Encounter (Signed)
Please review.    Thanks,   -Magda Muise  

## 2021-04-02 NOTE — Addendum Note (Signed)
Addended by: Kavin Leech E on: 04/02/2021 03:42 PM   Modules accepted: Orders

## 2021-04-02 NOTE — Telephone Encounter (Signed)
Pts meds were sent to [facility] pharmacy but she requested them all to go to [facility] / please resend to correct pharmacy

## 2021-04-03 MED ORDER — ATORVASTATIN CALCIUM 40 MG PO TABS: 40.0000 mg | ORAL_TABLET | Freq: Every day | ORAL | 3 refills | Status: AC

## 2021-04-03 MED ORDER — PREGABALIN 150 MG PO CAPS: 150.0000 mg | ORAL_CAPSULE | Freq: Three times a day (TID) | ORAL | 1 refills | Status: AC

## 2021-04-03 MED ORDER — DILTIAZEM HCL 120 MG PO TABS: 120.0000 mg | ORAL_TABLET | Freq: Every day | ORAL | 2 refills | Status: AC

## 2021-04-03 MED ORDER — SPIRONOLACTONE 25 MG PO TABS: 25.0000 mg | ORAL_TABLET | Freq: Every day | ORAL | 2 refills | Status: AC

## 2021-04-03 MED ORDER — MIRTAZAPINE 7.5 MG PO TABS: 7.5000 mg | ORAL_TABLET | Freq: Every day | ORAL | 2 refills | Status: AC

## 2021-04-03 MED ORDER — TORSEMIDE 40 MG PO TABS: 40.0000 mg | ORAL_TABLET | Freq: Two times a day (BID) | ORAL | 1 refills | Status: DC

## 2021-04-03 NOTE — Addendum Note (Signed)
Addended by: Malva Limes on: 04/03/2021 07:24 AM   Modules accepted: Orders

## 2021-04-03 NOTE — Telephone Encounter (Signed)
Have sent all the prescriptions that [facility] prescribed to [facility]. Suzanne Avila was mistakenly sent to local pharmacy by Dr. Sullivan Lone, but is supposed to be managed by Dr. Jayme Cloud.

## 2021-04-05 MED ORDER — BREZTRI AEROSPHERE 160-9-4.8 MCG/ACT IN AERO: 2.0000 | INHALATION_SPRAY | Freq: Two times a day (BID) | RESPIRATORY_TRACT | 0 refills | Status: DC

## 2021-04-05 MED ORDER — TORSEMIDE 20 MG PO TABS: 40.0000 mg | ORAL_TABLET | Freq: Two times a day (BID) | ORAL | 0 refills | Status: DC

## 2021-04-05 NOTE — Telephone Encounter (Signed)
[facility] in to let PCP know that the medication Torsemide 40 MG TABS is currently out of stock, and he is unsure when the next order will be in.

## 2021-04-05 NOTE — Addendum Note (Signed)
Addended by: Malva Limes on: 04/05/2021 12:41 PM   Modules accepted: Orders

## 2021-04-12 ENCOUNTER — Ambulatory Visit: Payer: Medicare Other | Admitting: Pulmonary Disease

## 2021-04-19 ENCOUNTER — Ambulatory Visit: Payer: Medicare Other | Admitting: Family Medicine

## 2021-04-28 ENCOUNTER — Encounter: Payer: Medicare Other | Admitting: Pain Medicine

## 2021-04-30 ENCOUNTER — Other Ambulatory Visit: Payer: Self-pay | Admitting: Family Medicine

## 2021-04-30 DIAGNOSIS — K219 Gastro-esophageal reflux disease without esophagitis: Secondary | ICD-10-CM

## 2021-04-30 DIAGNOSIS — R252 Cramp and spasm: Secondary | ICD-10-CM

## 2021-04-30 NOTE — Telephone Encounter (Signed)
Medication Refill - Medication: Generic Mobic,   Tizanidine 2 mg,  Omeprazole 40 mg,     (Agent: If yes, when and what did the pharmacy advise?)  Preferred Pharmacy (with phone number or street name): [facility]  Agent: Please be advised that RX refills may take up to 3 business days. We ask that you follow-up with your pharmacy.

## 2021-05-03 ENCOUNTER — Encounter: Payer: Self-pay | Admitting: Pain Medicine

## 2021-05-03 NOTE — Progress Notes (Signed)
Patient: Suzanne Avila  Service Category: E/M  Provider: Gaspar Cola, MD  DOB: [birth year]  DOS: 05/04/2021  Location: Office  MRN: [id number]  Setting: Ambulatory outpatient  Referring Provider: Florian Buff*  Type: Established Patient  Specialty: Interventional Pain Management  PCP: Virginia Crews, MD  Location: Remote location  Delivery: TeleHealth     Virtual Encounter - Pain Management PROVIDER NOTE: Information contained herein reflects review and annotations entered in association with encounter. Interpretation of such information and data should be left to medically-trained personnel. Information provided to patient can be located elsewhere in the medical record under "Patient Instructions". Document created using STT-dictation technology, any transcriptional errors that may result from process are unintentional.    Contact & Pharmacy Preferred: [phone] Home: [phone] (home) Mobile: There is no such number on file (mobile). E-mail: No e-mail address on record  [facility] [place] [address] [place] [address] Phone: [phone] Fax: [phone]   Pre-screening  Suzanne Avila offered "in-person" vs "virtual" encounter. She indicated preferring virtual for this encounter.   Reason COVID-19*  Social distancing based on CDC and AMA recommendations.   I contacted Lamonte Sakai on 05/04/2021 via telephone.      I clearly identified myself as Gaspar Cola, MD. I verified that I was speaking with the correct person using two identifiers (Name: Suzanne Avila, and date of birth: [birth year]).  Consent I sought verbal advanced consent from Lamonte Sakai for virtual visit interactions. I informed Suzanne Avila of possible security and privacy concerns, risks, and limitations associated with providing "not-in-person" medical evaluation and management services. I also informed Suzanne Avila of the  availability of "in-person" appointments. Finally, I informed her that there would be a charge for the virtual visit and that she could be  personally, fully or partially, financially responsible for it. Suzanne Avila expressed understanding and agreed to proceed.   Historic Elements   Suzanne Avila is a 75 y.o. year old, female patient evaluated today after our last contact on 01/27/2021. Suzanne Avila  has a past medical history of Closed left arm fracture, COPD (chronic obstructive pulmonary disease) ([place]), Dehydration (09/15/2020), Glaucoma, Hyperlipidemia, Hypertension, and Lung mass. She also  has a past surgical history that includes Back surgery; Eye surgery; and Carpal tunnel release (Left). Suzanne Avila has a current medication list which includes the following prescription(s): albuterol, albuterol, aspirin, atorvastatin, breztri aerosphere, diltiazem, dorzolamide, latanoprost, magnesium gluconate, mirtazapine, multi-vitamin, pregabalin, ramipril, ropinirole, spironolactone, torsemide, vitamin b-12, hydrocodone-acetaminophen, meloxicam, omeprazole, and tizanidine. She  reports that she has been smoking cigarettes. She has a 75.00 pack-year smoking history. She has never used smokeless tobacco. She reports current alcohol use of about 35.0 standard drinks of alcohol per week. She reports that she does not use drugs. Suzanne Avila is allergic to codeine, cyclobenzaprine, and penicillins.   HPI  Today, she is being contacted for medication management.  The last time that she had married medication failed was on 04/01/2021 (120 tablets).  The patient will need to come in to have her UDS updated.  The patient indicates doing well with the current medication regimen. No adverse reactions or side effects reported to the medications.   RTCB: 06/03/2021 Nonopioids transferred 07/27/2020: Lyrica  Pharmacotherapy Assessment  Analgesic: Hydrocodone/APAP 7.5/325, 1 tab PO q 6 hrs (30 mg/day of  hydrocodone) MME/day: 30 mg/day.   Monitoring: [place] PMP: PDMP reviewed during this encounter.  Pharmacotherapy: No side-effects or adverse reactions reported. Compliance: No problems identified. Effectiveness: Clinically acceptable. Plan: Refer to "POC".  UDS:  Summary  Date Value Ref Range Status  05/06/2020 Note  Final    Comment:    ==================================================================== [employer] Select 13 (MW) ==================================================================== Specimen Alert Note: Urinary creatinine is low; ability to detect some drugs may be compromised. Interpret results with caution. (Creatinine) ==================================================================== Test                             Result       Flag       Units  Drug Present and Declared for Prescription Verification   Hydrocodone                    3460         EXPECTED   ng/mg creat   Hydromorphone                  1380         EXPECTED   ng/mg creat   Dihydrocodeine                 580          EXPECTED   ng/mg creat   Norhydrocodone                 2430         EXPECTED   ng/mg creat    Sources of hydrocodone include scheduled prescription medications.    Hydromorphone, dihydrocodeine and norhydrocodone are expected    metabolites of hydrocodone. Hydromorphone and dihydrocodeine are    also available as scheduled prescription medications.  ==================================================================== Test                      Result    Flag   Units      Ref Range   Creatinine              10        LL     mg/dL      >=20 ==================================================================== Declared Medications:  The flagging and interpretation on this report are based on the  following declared medications.  Unexpected results may arise from  inaccuracies in the declared medications.   **Note: The testing scope of this panel includes these medications:    Hydrocodone   **Note: The testing scope of this panel does not include the  following reported medications:   Acetaminophen  Albuterol  Aspirin  Atorvastatin  Cyanocobalamin  Diltiazem  Dorzolamide  Fluticasone (Advair)  Furosemide (Lasix)  Latanoprost  Magnesium  Meloxicam  Multivitamin  Omeprazole  Pregabalin (Lyrica)  Ramipril  Ropinirole  Salmeterol (Advair)  Spironolactone ==================================================================== For clinical consultation, please call [phone]. ====================================================================     Laboratory Chemistry Profile   Renal Lab Results  Component Value Date   BUN 25 01/20/2021   CREATININE 1.03 (H) 01/20/2021   BCR 24 01/20/2021   GFRAA 62 11/11/2020   GFRNONAA 54 (L) 11/11/2020    Hepatic Lab Results  Component Value Date   AST 16 01/20/2021   ALT 8 01/20/2021   ALBUMIN 4.1 01/20/2021   ALKPHOS 217 (H) 01/20/2021    Electrolytes Lab Results  Component Value Date   NA 139 01/20/2021   K 4.5 01/20/2021   CL 95 (L) 01/20/2021   CALCIUM 10.1 01/20/2021   MG 1.6 (L) 09/17/2020   PHOS 2.5  09/17/2020    Bone Lab Results  Component Value Date   VD25OH 45.9 02/03/2020    Inflammation (CRP: Acute Phase) (ESR: Chronic Phase) Lab Results  Component Value Date   CRP <0.8 02/14/2017   ESRSEDRATE 20 02/14/2017   LATICACIDVEN 1.1 09/15/2020          Note: Above Lab results reviewed.  Imaging  CT CHEST WO CONTRAST CLINICAL DATA:  Evaluate lung mass.  EXAM: CT CHEST WITHOUT CONTRAST  TECHNIQUE: Multidetector CT imaging of the chest was performed following the standard protocol without IV contrast.  COMPARISON:  10/06/2020  FINDINGS: Cardiovascular: The heart size appears within normal limits. No pericardial effusion. Ascending thoracic aorta measures 3.5 cm, image 61/5. Aortic atherosclerosis. Coronary artery calcifications.  Mediastinum/Nodes: No enlarged  mediastinal or axillary lymph nodes. Thyroid gland, trachea, and esophagus demonstrate no significant findings.  Lungs/Pleura: No pleural effusion, airspace consolidation, or atelectasis. Cavitary lesion within the right upper lobe with surrounding architectural distortion and scarring is again identified. On today's exam this measures 1.9 x 2.0 by 1.5 cm (volume = 3 cm^3), image 90/5. Previously this measured 3.0 x 3.5 by 3.5 cm (volume = 19 cm^3).  Upper Abdomen: No acute abnormality.  Musculoskeletal: Mild curvature of the thoracic spine. Mild superior endplate compression deformity is identified involving, image 84/7. This is new from 10/06/2020.  IMPRESSION: 1. Interval decrease in size of cavitary lesion in the right upper lobe consistent with resolving infection. 2. Coronary artery calcifications noted. 3. Mild superior endplate compression deformity involving T11 is new from 10/06/2020  Aortic Atherosclerosis (ICD10-I70.0).,  Electronically Signed   By: Kerby Moors M.D.   On: 12/23/2020 10:23  Assessment  The primary encounter diagnosis was Chronic pain syndrome. 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  Plan of Care  Problem-specific:  No problem-specific Assessment & Plan notes found for this encounter.  Suzanne Avila has a current medication list which includes the following long-term medication(s): albuterol, albuterol, atorvastatin, diltiazem, mirtazapine, pregabalin, ramipril, ropinirole, spironolactone, torsemide, hydrocodone-acetaminophen, and omeprazole.  Pharmacotherapy (Medications Ordered): Meds  ordered this encounter  Medications   HYDROcodone-acetaminophen (NORCO) 7.5-325 MG tablet    Sig: Take 1 tablet by mouth every 6 (six) hours as needed for severe pain. Must last 30 days    Dispense:  120 tablet    Refill:  0    Not a duplicate. Do NOT delete! Dispense 1 day early if closed on fill date. Warn not to take CNS-depressants 8 hours before or after taking opioid. Do not send refill request. Renewal requires appointment.    Orders:  No orders of the defined types were placed in this encounter.  Follow-up plan:   Return in about 30 days (around 06/03/2021) for evaluation day (F2F) (MM).      Considering:   Diagnostic bilateral IA hip joint injection  Possible bilateral hip joint RFA  Possible bilateral lumbar facet RFA  Possible left SI joint RFA  Diagnostic bilateral L4-L5 TFESI  Diagnostic caudal ESI  Diagnostic caudal epidurogram  Possible RACZ epidural lysis of adhesions  Diagnostic left L2-3 LESI  Possible bilateral  Lumbar SCS trial implant    Palliative PRN treatment(s):   Diagnostic right lumbar facet block #  2  Diagnostic left lumbar facet block #3  Diagnostic left SI joint block #3        Recent Visits No visits were found meeting these conditions. Showing recent visits within past 90 days and meeting all other requirements Today's Visits Date Type Provider Dept  05/04/21 Telemedicine Milinda Pointer, MD [facility]  Showing today's visits and meeting all other requirements Future Appointments Date Type Provider Dept  06/01/21 Appointment Milinda Pointer, MD [facility]  Showing future appointments within next 90 days and meeting all other requirements I discussed the assessment and treatment plan with the patient. The patient was provided an opportunity to ask questions and all were answered. The patient agreed with the plan and demonstrated an understanding of the instructions.  Patient advised to call back or seek an in-person  evaluation if the symptoms or condition worsens.  Duration of encounter: 12 minutes.  Note by: Gaspar Cola, MD Date: 05/04/2021; Time: 4:56 PM

## 2021-05-04 ENCOUNTER — Telehealth: Payer: Self-pay

## 2021-05-04 ENCOUNTER — Ambulatory Visit: Payer: Medicare Other | Attending: Pain Medicine | Admitting: Pain Medicine

## 2021-05-04 ENCOUNTER — Other Ambulatory Visit: Payer: Self-pay

## 2021-05-04 DIAGNOSIS — M79604 Pain in right leg: Secondary | ICD-10-CM | POA: Diagnosis not present

## 2021-05-04 DIAGNOSIS — M47816 Spondylosis without myelopathy or radiculopathy, lumbar region: Secondary | ICD-10-CM

## 2021-05-04 DIAGNOSIS — M961 Postlaminectomy syndrome, not elsewhere classified: Secondary | ICD-10-CM | POA: Diagnosis not present

## 2021-05-04 DIAGNOSIS — G894 Chronic pain syndrome: Secondary | ICD-10-CM | POA: Diagnosis not present

## 2021-05-04 DIAGNOSIS — M79605 Pain in left leg: Secondary | ICD-10-CM

## 2021-05-04 DIAGNOSIS — M5442 Lumbago with sciatica, left side: Secondary | ICD-10-CM

## 2021-05-04 DIAGNOSIS — M5441 Lumbago with sciatica, right side: Secondary | ICD-10-CM

## 2021-05-04 DIAGNOSIS — Z79899 Other long term (current) drug therapy: Secondary | ICD-10-CM

## 2021-05-04 DIAGNOSIS — G8929 Other chronic pain: Secondary | ICD-10-CM

## 2021-05-04 DIAGNOSIS — Z79891 Long term (current) use of opiate analgesic: Secondary | ICD-10-CM

## 2021-05-04 MED ORDER — HYDROCODONE-ACETAMINOPHEN 7.5-325 MG PO TABS: 1.0000 | ORAL_TABLET | Freq: Four times a day (QID) | ORAL | 0 refills | Status: DC | PRN

## 2021-05-04 MED ORDER — TIZANIDINE HCL 2 MG PO CAPS: 2.0000 mg | ORAL_CAPSULE | Freq: Three times a day (TID) | ORAL | 1 refills | Status: AC | PRN

## 2021-05-04 MED ORDER — MELOXICAM 15 MG PO TABS: 15.0000 mg | ORAL_TABLET | Freq: Every day | ORAL | 0 refills | Status: DC

## 2021-05-04 MED ORDER — OMEPRAZOLE 40 MG PO CPDR: 40.0000 mg | DELAYED_RELEASE_CAPSULE | Freq: Every day | ORAL | 3 refills | Status: AC

## 2021-05-04 NOTE — Progress Notes (Signed)
Spoke to patient to ask patient if we could move her appointment to 10:00 am  that is schedule for telephone appointment on 05/07/2021 for CCM at 11:00 am with Angelena Sole the Clinical pharmacist. Patient states that would be okay to moved appointment to 10:00 am. Sent Message to [place].  Everlean Cherry Clinical Pharmacist Assistant [phone]

## 2021-05-06 ENCOUNTER — Telehealth: Payer: Self-pay

## 2021-05-06 NOTE — Progress Notes (Signed)
    Chronic Care Management Pharmacy Assistant   Name: Suzanne Avila  MRN: [id number] DOB: 1945-11-01  Patient called to remind of appointment with Angelena Sole, CPP on 05/07/2021 @ 1000.  Patient aware of appointment date, time, and type of appointment (telephone). Patient aware to have/bring all medications, supplements, blood pressure and/or blood sugar logs to visit.  Questions: Have you had any recent office visit or specialist visit outside of [facility]? No  Are there any concerns you would like to discuss during your office visit?  No  Are you having any problems obtaining your medications? None  Care Gaps: TETANUS/TDAP,  COLONOSCOPY (last completed 04.08.2012). COVID-19 Vaccine (4th Booster) MAMMOGRAM (last one 03/17/2020/)  Star Rating Drugs: Atorvastatin 40 mg last filled on 04/03/2021 for a 90-Day supply at [facility] Ramipril 2.5 mg last filled 04/01/2201 for a 90- Day supply at [facility]  Any gaps in medications fill history? No Care Gaps in any of patients medications at this time.

## 2021-05-07 ENCOUNTER — Telehealth: Payer: Self-pay

## 2021-05-07 NOTE — Progress Notes (Deleted)
Chronic Care Management Pharmacy Note  05/07/2021 Name:  Suzanne Avila MRN:  [id number] DOB:  Nov 01, 1945  Subjective: Suzanne Avila is an 75 y.o. year old female who is a primary patient of Bacigalupo, Dionne Bucy, MD.  The CCM team was consulted for assistance with disease management and care coordination needs.    Engaged with patient by telephone for initial visit in response to provider referral for pharmacy case management and/or care coordination services.   Consent to Services:  The patient was given the following information about Chronic Care Management services today, agreed to services, and gave verbal consent: 1. CCM service includes personalized support from designated clinical staff supervised by the primary care provider, including individualized plan of care and coordination with other care providers 2. 24/7 contact phone numbers for assistance for urgent and routine care needs. 3. Service will only be billed when office clinical staff spend 20 minutes or more in a month to coordinate care. 4. Only one practitioner may furnish and bill the service in a calendar month. 5.The patient may stop CCM services at any time (effective at the end of the month) by phone call to the office staff. 6. The patient will be responsible for cost sharing (co-pay) of up to 20% of the service fee (after annual deductible is met). Patient agreed to services and consent obtained.  Patient Care Team: Virginia Crews, MD as PCP - General (Family Medicine) Lucky Cowboy Erskine Squibb, MD as Referring Physician (Vascular Surgery) Milinda Pointer, MD as Referring Physician (Pain Medicine) Leandrew Koyanagi, MD as Referring Physician (Ophthalmology) Telford Nab, RN as Oncology Nurse Navigator Tyler Pita, MD as Consulting Physician (Pulmonary Disease) Germaine Pomfret, [facility] (Pharmacist)  Recent office visits: 01/20/2021 Fenton Malling PA AMB Referral to [facility] Coordinaton 12/17/2020  Fenton Malling PA Increase torsemide to 40mg  BID,Noted cellulitis of the right lower leg secondary to swelling. Started Keflex 500 mg  11/11/2020 Fenton Malling PA continue torsemide  10/30/2020 Fenton Malling PA Change furosemide to torsemide 20-40 mg   10/21/2020  Fenton Malling PA -Continue mirtazapine,May take a second tab of furosemide if legs are more swollen,Continue atorvastatin ,diltiazem ,spironolactone, dorzolamide,latanoprost,omeprazole, rOPINIRole ,vitamin B-12 ,pregabalin  09/23/2020 Fenton Malling PA -start low dose Mirtazapine as below. F/U in 4 weeks. Continue diltiazem 120mg  daily and ramipril 2.5mg  daily.   Recent consult visits: 01/27/2021  Dr.Naveira  (Pain Medicine)  11/18/2020 Dr. Dorothe Pea (pulmonology) Continue Breztri,Continue as needed albuterol 11/02/2020 - Dr.Naveira  (Pain Medicine) 11/10/2020 - Dr.Naveira  (Pain Medicine) Procedure Visit 09/22/2020- Dr. Dorothe Pea (pulmonology) Continue Augmentin,Continue Breztri 2 puffs twice a day 09/14/2020  - Dr.Naveira  (Pain Medicine) 09/10/2020 - Dr.Naveira  (Pain Medicine) 09/04/2020- Dr.Rao MD (Oncology) 09/04/2020-  Dr. Patsey Berthold MD - (Pulmonary) Pharmacy did not have Avelox, provider change Avelox to Levaquin 500 mg p.o. daily x10 days and Flagyl 500 mg p.o. 3 times daily x10 days.  09/04/2020 - Dr. Patsey Berthold MD - (Pulmonary) Started Avelox (moxifloxacin) is 1 tablet daily.  Take it in the evening before you go to bed 10 day supply.Recommend Culturelle, Ultimate Strength, 1 capsule daily while you are on the antibiotics and for a few days afterwards.Instructed to take your Prilosec (omeprazole) every day.Samples given for Breztri, a new inhaler, 2 puffs twice a day.Stop Advair. 08/25/2020 - Dr. Lowella Dandy MD (pain Medicine) -Sacroiliac Joint injections  Hospital visits: Medication Reconciliation was completed by comparing discharge summary, patient's EMR and Pharmacy list, and upon discussion with patient.    Admitted to the hospital  on 09/14/2020 due to Right upper lobe cavitary mass/H/o smoking, Afib with RVR--resolved Dysphagia suspected due to Thrush/Esophagitis--improved Electrolyte abnormality--repelted. Discharge date was 09/17/2020. Discharged from [facility]?Medications Started at [facility] Discharge:?? -started none   Medication Changes at Hospital Discharge: -Changed None   Medications Discontinued at Hospital Discharge: -Stopped levofloxacin 500 mg, linaclotide 145 mcg ,meloxicam 15 mg, metronidazole 500 mg,moxifloxacin 400 mg     Medications that remain the same after Hospital Discharge:??  -All other medications will remain the same.   Subjective: Patient feeling very off balance, stumbling. Denies dizziness.   Objective:  Lab Results  Component Value Date   CREATININE 1.03 (H) 01/20/2021   BUN 25 01/20/2021   GFRNONAA 54 (L) 11/11/2020   GFRAA 62 11/11/2020   NA 139 01/20/2021   K 4.5 01/20/2021   CALCIUM 10.1 01/20/2021   CO2 26 01/20/2021   GLUCOSE 82 01/20/2021    No results found for: HGBA1C, FRUCTOSAMINE, GFR, MICROALBUR  Last diabetic Eye exam: No results found for: HMDIABEYEEXA  Last diabetic Foot exam: No results found for: HMDIABFOOTEX   Lab Results  Component Value Date   CHOL 154 02/03/2020   HDL 70 02/03/2020   LDLCALC 64 02/03/2020   TRIG 117 02/03/2020   CHOLHDL 2.6 01/09/2019    Hepatic Function Latest Ref Rng & Units 01/20/2021 11/11/2020 09/16/2020  Total Protein 6.0 - 8.5 g/dL 7.1 6.9 4.9(L)  Albumin 3.7 - 4.7 g/dL 4.1 4.3 2.1(L)  AST 0 - 40 IU/L $Remov'16 15 18  'epQUHw$ ALT 0 - 32 IU/L $Remov'8 8 13  'EOmUrw$ Alk Phosphatase 44 - 121 IU/L 217(H) 125(H) 54  Total Bilirubin 0.0 - 1.2 mg/dL 0.3 0.3 0.6    Lab Results  Component Value Date/Time   TSH 1.110 06/14/2016 09:13 AM    CBC Latest Ref Rng & Units 01/20/2021 09/15/2020 09/14/2020  WBC 3.4 - 10.8 x10E3/uL 10.6 12.5(H) 12.4(H)  Hemoglobin 11.1 - 15.9 g/dL 13.5 9.9(L)  12.1  Hematocrit 34.0 - 46.6 % 39.9 27.5(L) 33.9(L)  Platelets 150 - 450 x10E3/uL 397 321 378    Lab Results  Component Value Date/Time   VD25OH 45.9 02/03/2020 11:12 AM   VD25OH 31.8 01/01/2018 10:46 AM    Clinical ASCVD: No  The 10-year ASCVD risk score Mikey Bussing DC Jr., et al., 2013) is: 26.4%   Values used to calculate the score:     Age: 75 years     Sex: Female     Is Non-Hispanic African American: No     Diabetic: No     Tobacco smoker: Yes     Systolic Blood Pressure: 026 mmHg     Is BP treated: Yes     HDL Cholesterol: 70 mg/dL     Total Cholesterol: 154 mg/dL    Depression screen [facility] 2/9 11/11/2020 08/07/2020 01/22/2020  Decreased Interest 0 0 0  Down, Depressed, Hopeless 0 1 0  PHQ - 2 Score 0 1 0  Altered sleeping 0 1 -  Tired, decreased energy 0 2 -  Change in appetite 0 3 -  Feeling bad or failure about yourself  0 0 -  Trouble concentrating 0 2 -  Moving slowly or fidgety/restless 0 0 -  Suicidal thoughts 0 0 -  PHQ-9 Score 0 9 -  Difficult doing work/chores Not difficult at all Somewhat difficult -    Social History   Tobacco Use  Smoking Status Every Day   Packs/day: 1.50   Years: 50.00  Pack years: 75.00   Types: Cigarettes  Smokeless Tobacco Never  Tobacco Comments   1PPD 11/18/2020   BP Readings from Last 3 Encounters:  01/27/21 (!) 115/50  01/20/21 (!) 175/90  12/17/20 (!) 165/81   Pulse Readings from Last 3 Encounters:  01/27/21 79  01/20/21 77  12/17/20 75   Wt Readings from Last 3 Encounters:  01/27/21 93 lb (42.2 kg)  01/20/21 93 lb 6.4 oz (42.4 kg)  12/17/20 104 lb (47.2 kg)   BMI Readings from Last 3 Encounters:  01/27/21 18.78 kg/m  01/20/21 18.86 kg/m  12/17/20 21.01 kg/m    Assessment/Interventions: Review of patient past medical history, allergies, medications, health status, including review of consultants reports, laboratory and other test data, was performed as part of comprehensive evaluation and provision of  chronic care management services.   SDOH:  (Social Determinants of Health) assessments and interventions performed: Yes   SDOH Screenings   Alcohol Screen: Low Risk    Last Alcohol Screening Score (AUDIT): 0  Depression (PHQ2-9): Low Risk    PHQ-2 Score: 0  Financial Resource Strain: Low Risk    Difficulty of Paying Living Expenses: Not hard at all  Food Insecurity: Not on file  Housing: Not on file  Physical Activity: Not on file  Social Connections: Not on file  Stress: Not on file  Tobacco Use: High Risk   Smoking Tobacco Use: Every Day   Smokeless Tobacco Use: Never  Transportation Needs: Not on file    CCM Care Plan  Allergies  Allergen Reactions   Codeine Itching   Cyclobenzaprine Itching   Penicillins     Itching without rash per patient.  Unable to say when it happened, other than "a long time ago"    Medications Reviewed Today     Reviewed by Milinda Pointer, MD (Physician) on 05/04/21 at 1651  Med List Status: <None>   Medication Order Taking? Sig Documenting Provider Last Dose Status Informant  albuterol (PROVENTIL) (2.5 MG/3ML) 0.083% nebulizer solution [id number] Yes Take 3 mLs (2.5 mg total) by nebulization every 6 (six) hours as needed for wheezing or shortness of breath. Fenton Malling M, PA-C Taking Active   albuterol (VENTOLIN HFA) 108 (90 Base) MCG/ACT inhaler [id number] Yes Inhale 2 puffs into the lungs every 6 (six) hours as needed for wheezing or shortness of breath. Tyler Pita, MD Taking Active   aspirin 325 MG tablet [id number] Yes Take 1 tablet (325 mg total) by mouth daily. Mar Daring, PA-C Taking Active Self  atorvastatin (LIPITOR) 40 MG tablet [id number] Yes Take 1 tablet (40 mg total) by mouth daily. Birdie Sons, MD Taking Active   Budeson-Glycopyrrol-Formoterol [facility] AEROSPHERE) 160-9-4.8 MCG/ACT Hollie Salk [id number] Yes Inhale 2 puffs into the lungs in the morning and at bedtime. Tyler Pita, MD Taking Active    diltiazem (CARDIZEM) 120 MG tablet [id number] Yes Take 1 tablet (120 mg total) by mouth daily. Birdie Sons, MD Taking Active     Patient not taking:  Discontinued 05/04/21 1651 (Patient Preference)   dorzolamide (TRUSOPT) 2 % ophthalmic solution [id number] Yes Place 1 drop into both eyes 2 (two) times daily. Jerrol Banana., MD Taking Active     Patient not taking:  Discontinued 05/04/21 1651 (Patient Preference)   HYDROcodone-acetaminophen (NORCO) 7.5-325 MG tablet [id number]  Take 1 tablet by mouth every 6 (six) hours as needed for severe pain. Must last 30 days Milinda Pointer, MD  Active  Med Note Dossie Arbour, FRANCISCO A   Wed Jan 27, 2021 12:20 PM) FUTURE Prescription. (NOT a DUPLICATE!!) >>>DO NOT DELETE<<< (even if Expired!) See Care Coordination Note from [facility] (Dr. Dossie Arbour)   latanoprost (XALATAN) 0.005 % ophthalmic solution [id number] Yes Place 1 drop into both eyes at bedtime. Jerrol Banana., MD Taking Active   magnesium gluconate (MAGONATE) 500 MG tablet [id number] Yes Take 500 mg by mouth daily. [provider] Taking Active Self  meloxicam (MOBIC) 15 MG tablet [id number]  Take 1 tablet (15 mg total) by mouth daily. Virginia Crews, MD  Active   mirtazapine (REMERON) 7.5 MG tablet [id number] Yes Take 1 tablet (7.5 mg total) by mouth at bedtime. Birdie Sons, MD Taking Active   Multiple Vitamin (MULTI-VITAMIN) tablet [id number] Yes Take 1 tablet by mouth daily.  [provider] Taking Active Self  omeprazole (PRILOSEC) 40 MG capsule [id number]  Take 1 capsule (40 mg total) by mouth daily. Virginia Crews, MD  Active   pregabalin (LYRICA) 150 MG capsule [id number] Yes Take 1 capsule (150 mg total) by mouth every 8 (eight) hours. Birdie Sons, MD Taking Active   ramipril (ALTACE) 2.5 MG capsule [id number] Yes Take 1 capsule (2.5 mg total) by mouth daily. Fenton Malling M, PA-C Taking Active   rOPINIRole (REQUIP) 2  MG tablet [id number] Yes Take 1 tablet (2 mg total) by mouth 2 (two) times daily.  Patient taking differently: Take 2 mg by mouth at bedtime.   Mar Daring, PA-C Taking Active   spironolactone (ALDACTONE) 25 MG tablet [id number] Yes Take 1 tablet (25 mg total) by mouth daily. Birdie Sons, MD Taking Active   tizanidine (ZANAFLEX) 2 MG capsule [id number]  Take 1 capsule (2 mg total) by mouth 3 (three) times daily as needed for muscle spasms. Virginia Crews, MD  Active   Patient not taking:  Discontinued 05/04/21 1651 (Patient Preference)   torsemide (DEMADEX) 20 MG tablet [id number] Yes Take 2 tablets (40 mg total) by mouth 2 (two) times daily. Birdie Sons, MD Taking Active   vitamin B-12 (CYANOCOBALAMIN) 500 MCG tablet [id number] Yes Take 1 tablet (500 mcg total) by mouth daily. 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[place]) 11/02/2020   Avascular necrosis of femoral head (Left) ([place]) 11/02/2020   Cavitary lesion of lung 09/15/2020   AF (paroxysmal atrial fibrillation) ([place]) 09/15/2020   Failure to thrive in adult 09/15/2020   Severe protein-calorie malnutrition Altamease Oiler: less than 60% of standard weight) ([place]) 09/15/2020   Dysphagia 09/15/2020   Hypotension 09/15/2020   Abscess of right lung with pneumonia ([place]) 09/14/2020   Chronic low back pain (Left) w/o sciatica 09/10/2020   Pharmacologic therapy 05/05/2020   Chronic diarrhea 05/04/2020   DDD (degenerative disc disease), lumbosacral 11/19/2019   Therapeutic opioid-induced constipation (OIC) 08/01/2019   Chronic sacroiliac joint pain (Left) 05/01/2019   Chronic groin pain (Left) 05/01/2019   Spondylosis without myelopathy or radiculopathy, lumbosacral region 05/01/2019    Other specified dorsopathies, sacral and sacrococcygeal region 05/01/2019   Renal artery stenosis ([place]) 12/27/2017   Pain in limb 12/22/2017   Tobacco use disorder 12/22/2017   Renal atrophy, right 12/22/2017  Thoracic radiculitis 01/31/2017   Chronic lower extremity pain (Primary Area of Pain) (Bilateral) (L>R) 04/25/2016   Chronic upper extremity pain (Third area of Pain) (Bilateral) (L>R) 04/25/2016   Disturbance of skin sensation 04/20/2016   Neurogenic pain 04/20/2016   Chronic hip pain (Bilateral) (L>R) 11/11/2015   Vitamin D deficiency 11/11/2015   Edema 09/02/2015   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   Encounter for therapeutic drug level monitoring 08/14/2015   Long term current use of opiate analgesic 08/14/2015   Long term prescription opiate use 76/73/4193   Uncomplicated opioid dependence ([place]) 08/14/2015   Opiate use (30 MME/Day) 08/14/2015   Chronic low back pain (2ry area of Pain) (Bilateral) (L>R) w/ sciatica (Bilateral) 08/14/2015   Chronic pain syndrome 79/11/4095   Alcoholic ([place]) 35/32/9924   B12 deficiency 08/14/2015   Narrowing of intervertebral disc space 08/14/2015   Encounter for screening for diabetes mellitus 08/14/2015   History of metabolic disorder 26/83/4196   Fungal infection of toenail 08/14/2015   Osteopenia 08/14/2015   Compulsive tobacco user syndrome 08/14/2015   Osteoarthritis of hip (Left) 08/14/2015   Chronic hip pain (Left) 08/14/2015   Chronic lumbar radicular pain (L5 dermatome) (Bilateral) (L>R) 08/14/2015   Lumbar facet syndrome (Bilateral) (L>R) 08/14/2015   Lumbar spondylosis 08/14/2015   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) 08/14/2015   Lumbar Postlaminectomy syndrome (L4-5) 08/14/2015   Fibromyalgia 08/14/2015   Chronic airway obstruction ([place]) 04/21/2015   Allergic rhinitis 02/10/2010   Atrial paroxysmal tachycardia ([place]) 07/06/2009   CAFL (chronic airflow limitation) ([place]) 07/06/2009   Acid reflux  07/06/2009   Glaucoma 07/06/2009   Essential hypertension 07/06/2009   Hypercholesterolemia without hypertriglyceridemia 07/06/2009   Absence of bladder continence 07/06/2009    Immunization History  Administered Date(s) Administered   Fluad Quad(high Dose 65+) 08/02/2019, 08/07/2020   Influenza Split 07/14/2010, 08/16/2011   Influenza, High Dose Seasonal PF 09/02/2017, 09/27/2018   Influenza,inj,Quad PF,6+ Mos 07/09/2014   Moderna Sars-Covid-2 Vaccination 01/02/2020, 01/30/2020, 11/03/2020   Pneumococcal Conjugate-13 01/09/2014   Pneumococcal Polysaccharide-23 12/01/2009, 07/24/2016   Zoster Recombinat (Shingrix) 09/02/2017, 01/09/2019, 08/02/2019   Zoster, Live 05/23/2014    Conditions to be addressed/monitored:  Hypertension, Hyperlipidemia, Atrial Fibrillation, GERD, COPD, Osteopenia, Tobacco use and Chronic Pain  There are no care plans that you recently modified to display for this patient.    Medication Assistance: None required.  Patient affirms current coverage meets needs.  Patient's preferred pharmacy is:  [facility], [address] [place] [address] Phone: [phone] Fax: [phone]  Uses pill box? Yes Pt endorses 100% compliance  We discussed: Current pharmacy is preferred with insurance plan and patient is satisfied with pharmacy services Patient decided to: Continue current medication management strategy  Care Plan and Follow Up Patient Decision:  Patient agrees to Care Plan and Follow-up.  Plan: Telephone follow up appointment with care management team member scheduled for:  05/07/2021 at 11:00 AM  Junius Argyle, PharmD, CPP Clinical Pharmacist [facility] [phone]  Current Barriers:  Unable to maintain control of blood pressure  Pharmacist Clinical Goal(s):  Patient will achieve control of blood pressure as evidenced by BP less than 140/90 through collaboration with PharmD  and provider.   Interventions: 1:1 collaboration with Brita Romp Dionne Bucy, MD regarding development and update of comprehensive plan of care as evidenced by provider attestation and co-signature Inter-disciplinary care team collaboration (see longitudinal plan of care) Comprehensive medication review performed; medication list updated in  electronic medical record  Hypertension (BP goal <140/90) -Uncontrolled -Current treatment: Diltiazem 120 mg daily  Ramipril 2.5 mg daily  Spironolactone 25 mg daily  Torsemide 40 mg twice daily  -Medications previously tried: NA  -Current home readings: NA -Denies hypotensive/hypertensive symptoms -Educated on Daily salt intake goal < 2300 mg; Importance of home blood pressure monitoring; -Counseled to monitor BP at home weekly, document, and provide log at future appointments -Recommended to continue current medication  Hyperlipidemia: (LDL goal < 70) -Controlled -Current treatment: Atorvastatin 40 mg daily  -Medications previously tried: NA  -Educated on Importance of limiting foods high in cholesterol; -Recommended to continue current medication  Atrial Fibrillation (Goal: prevent stroke and major bleeding) -Controlled -CHADSVASC: 4 -Current treatment: Rate control: Diltiazem 120 mg daily  Anticoagulation: None, patient waiting for bronchoscopy -Medications previously tried: NA -Counseled on increased risk of stroke due to Afib and benefits of anticoagulation for stroke prevention; -Recommended to continue current medication  Chronic Airway Obstruction (Goal: control symptoms and prevent exacerbations) -Controlled -Current treatment  Proventil 0.083% Soln. 3 mL every 6 hours as needed  Ventolin HFA 2 puffs every 6 hours as needed Breztri 2 puffs twice daily (sometimes taking three times daily PRN) -Medications previously tried: NA  -Pulmonary function testing: FEV1 66%, FEV1/FVC 75% (Feb 2022) -Exacerbations requiring treatment in  last 6 months: No -Patient denies consistent use of maintenance inhaler -Frequency of rescue inhaler use: Very seldom,  -Counseled on Benefits of consistent maintenance inhaler use When to use rescue inhaler -Recommended to continue current medication  Tobacco use (Goal Quit Smoking) -Uncontrolled -Previous quit attempts: NA -Current treatment  1 ppd  -Patient smokes Within 30 minutes of waking -Patient triggers include: stress, anxiety and boredom  and finishing a meal -Patient is currently in a pre-contemplative stage of change  -Provided contact information for [place] Quit Line (1-800-QUIT-NOW) and encouraged patient to reach out to this group for support. -Recommended to continue current medication  Chronic Pain (Goal: Maintain adequate pain control) -Controlled -Current treatment  Hydrocodone 7.5-325 mg every 6 hours  Pregabalin 150 mg every 8 hours  Tizanidine 2 mg three times daily  -Medications previously tried: NA  - Spasms in fingers, tizanidine does not provide relief.  -Recommended to continue current medication  GERD (Goal: Prevent heartburn or reflux symptoms) -Controlled -Current treatment  Omeprazole 40 mg daily  -Medications previously tried: NA  -Previously had dysphagia, much improved.  -Sometimes skips if symptoms well controlled  -Recommended to continue current medication   Patient Goals/Self-Care Activities Patient will:  - check blood pressure weekly, document, and provide at future appointments  Follow Up Plan: Telephone follow up appointment with care management team member scheduled for:  05/07/2021 at 11:00 AM

## 2021-05-12 ENCOUNTER — Telehealth: Payer: Self-pay | Admitting: Pain Medicine

## 2021-05-12 NOTE — Telephone Encounter (Signed)
Atient states that her pain is in her left groin and runs down her leg to her knee in the front, but sometimes in the back too.  Will message Dr Laban Emperor and see if he will order a procedure.

## 2021-05-12 NOTE — Telephone Encounter (Signed)
Per Dr Laban Emperor, patient needs to come in for a eval for a new pain.

## 2021-05-14 ENCOUNTER — Ambulatory Visit (INDEPENDENT_AMBULATORY_CARE_PROVIDER_SITE_OTHER): Payer: Medicare Other | Admitting: Family Medicine

## 2021-05-14 ENCOUNTER — Encounter: Payer: Self-pay | Admitting: Family Medicine

## 2021-05-14 ENCOUNTER — Other Ambulatory Visit: Payer: Self-pay

## 2021-05-14 VITALS — BP 109/58 | HR 78 | Temp 98.1°F | Resp 16 | Wt 91.2 lb

## 2021-05-14 DIAGNOSIS — E43 Unspecified severe protein-calorie malnutrition: Secondary | ICD-10-CM | POA: Diagnosis not present

## 2021-05-14 DIAGNOSIS — E7800 Pure hypercholesterolemia, unspecified: Secondary | ICD-10-CM | POA: Diagnosis not present

## 2021-05-14 DIAGNOSIS — R1319 Other dysphagia: Secondary | ICD-10-CM

## 2021-05-14 DIAGNOSIS — F17200 Nicotine dependence, unspecified, uncomplicated: Secondary | ICD-10-CM | POA: Diagnosis not present

## 2021-05-14 DIAGNOSIS — I10 Essential (primary) hypertension: Secondary | ICD-10-CM

## 2021-05-14 DIAGNOSIS — R2689 Other abnormalities of gait and mobility: Secondary | ICD-10-CM

## 2021-05-14 DIAGNOSIS — I9589 Other hypotension: Secondary | ICD-10-CM

## 2021-05-14 DIAGNOSIS — F1020 Alcohol dependence, uncomplicated: Secondary | ICD-10-CM

## 2021-05-14 DIAGNOSIS — E861 Hypovolemia: Secondary | ICD-10-CM

## 2021-05-14 DIAGNOSIS — R636 Underweight: Secondary | ICD-10-CM | POA: Insufficient documentation

## 2021-05-14 NOTE — Progress Notes (Signed)
Established patient visit   Patient: Suzanne Avila   DOB: 04/02/1946   75 y.o. Female  MRN: [id number] Visit Date: 05/14/2021  Today's healthcare provider: Shirlee Latch, MD   Chief Complaint  Patient presents with   Hypertension   Hyperlipidemia   Subjective    Hypertension Pertinent negatives include no chest pain, headaches or shortness of breath.  Hyperlipidemia Pertinent negatives include no chest pain or shortness of breath.   Hypertension, follow-up  BP Readings from Last 3 Encounters:  05/14/21 (!) 109/58  01/27/21 (!) 115/50  01/20/21 (!) 175/90   Wt Readings from Last 3 Encounters:  05/14/21 91 lb 3.2 oz (41.4 kg)  01/27/21 93 lb (42.2 kg)  01/20/21 93 lb 6.4 oz (42.4 kg)     She was last seen for hypertension 4 months ago.  BP at that visit was 175/90. Management since that visit includes none.  She reports good compliance with treatment. She is not having side effects.  She is following a Regular diet. She is not exercising. She does smoke.  Use of agents associated with hypertension: NSAIDS.   Outside blood pressures are not being checked. Symptoms: No chest pain No chest pressure  No palpitations No syncope  Yes dyspnea No orthopnea  No paroxysmal nocturnal dyspnea No lower extremity edema   Pertinent labs: Lab Results  Component Value Date   CHOL 154 02/03/2020   HDL 70 02/03/2020   LDLCALC 64 02/03/2020   TRIG 117 02/03/2020   CHOLHDL 2.6 01/09/2019   Lab Results  Component Value Date   NA 139 01/20/2021   K 4.5 01/20/2021   CREATININE 1.03 (H) 01/20/2021   GFRNONAA 54 (L) 11/11/2020   GFRAA 62 11/11/2020   GLUCOSE 82 01/20/2021     The 10-year ASCVD risk score Denman George DC Jr., et al., 2013) is: 24.1%   ---------------------------------------------------------------------------------------------------  Lipid/Cholesterol, Follow-up  Last lipid panel Other pertinent labs  Lab Results  Component Value Date   CHOL 154  02/03/2020   HDL 70 02/03/2020   LDLCALC 64 02/03/2020   TRIG 117 02/03/2020   CHOLHDL 2.6 01/09/2019   Lab Results  Component Value Date   ALT 8 01/20/2021   AST 16 01/20/2021   PLT 397 01/20/2021   TSH 1.110 06/14/2016     She was last seen for this 4 months ago.  Management since that visit includes none.  She reports excellent compliance with treatment. She is not having side effects.   Symptoms: No chest pain No chest pressure/discomfort  Yes dyspnea No lower extremity edema  Yes numbness or tingling of extremity No orthopnea  No palpitations No paroxysmal nocturnal dyspnea  No speech difficulty No syncope   Current diet: well balanced Current exercise: none  The 10-year ASCVD risk score Denman George DC Jr., et al., 2013) is: 24.1%  Throat Pain The patient states that she is experiencing severe throat pain. The pain occurs when swallowing. The onset is 3-4 months ago. The patient states that the pain that she is experiencing is identical to the pain she was experiencing in her throat when she had a lung infection.   Loss of Balance The patient states that she has been experiencing loss of balance. She states that this occurs when she stands and states that she believes that it is affecting her walking.    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   Cavitary lesion of lung 09/15/2020   AF (paroxysmal atrial fibrillation) (HCC) 09/15/2020   Failure to thrive in adult 09/15/2020   Severe protein-calorie malnutrition [name]: less than 60% of standard weight) (HCC) 09/15/2020   Dysphagia 09/15/2020   Hypotension 09/15/2020   Abscess of right lung with  pneumonia (HCC) 09/14/2020   Chronic low back pain (Left) w/o sciatica 09/10/2020   Pharmacologic therapy 05/05/2020   Chronic diarrhea 05/04/2020   DDD (degenerative disc disease), lumbosacral 11/19/2019   Therapeutic opioid-induced constipation (OIC) 08/01/2019   Chronic sacroiliac joint pain (Left) 05/01/2019   Chronic groin pain (Left) 05/01/2019   Spondylosis without myelopathy or radiculopathy, lumbosacral region 05/01/2019   Other specified dorsopathies, sacral and sacrococcygeal region 05/01/2019   Renal artery stenosis (HCC) 12/27/2017   Pain in limb 12/22/2017   Tobacco use disorder 12/22/2017   Renal atrophy, right 12/22/2017   Thoracic radiculitis 01/31/2017   Chronic lower extremity pain (Primary Area of Pain) (Bilateral) (L>R) 04/25/2016   Chronic upper extremity pain (Third area of Pain) (Bilateral) (L>R) 04/25/2016   Disturbance of skin sensation 04/20/2016   Neurogenic pain 04/20/2016   Chronic hip pain (Bilateral) (L>R) 11/11/2015   Vitamin D deficiency 11/11/2015   Edema 09/02/2015   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   Long term current use of opiate analgesic 08/14/2015   Long term prescription opiate use 08/14/2015   Uncomplicated opioid dependence (HCC) 08/14/2015   Opiate use (30 MME/Day) 08/14/2015   Chronic low back pain (2ry area of Pain) (Bilateral) (L>R) w/ sciatica (Bilateral) 08/14/2015   Chronic pain syndrome 08/14/2015   Alcoholic (HCC) 08/14/2015   B12 deficiency 08/14/2015   Narrowing of intervertebral disc space 08/14/2015   History of metabolic disorder 08/14/2015   Fungal infection of toenail 08/14/2015   Osteopenia 08/14/2015   Compulsive tobacco user syndrome 08/14/2015   Osteoarthritis of hip (Left) 08/14/2015   Chronic hip pain (Left) 08/14/2015   Chronic lumbar radicular pain (L5 dermatome) (Bilateral) (L>R) 08/14/2015   Lumbar facet syndrome (Bilateral) (L>R) 08/14/2015   Lumbar spondylosis 08/14/2015   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) 08/14/2015   Lumbar Postlaminectomy syndrome (L4-5) 08/14/2015   Fibromyalgia 08/14/2015   Chronic airway obstruction (HCC) 04/21/2015   Allergic rhinitis 02/10/2010   Atrial paroxysmal tachycardia (HCC) 07/06/2009   CAFL (chronic airflow limitation) (HCC) 07/06/2009   Acid reflux 07/06/2009   Glaucoma 07/06/2009   Essential hypertension 07/06/2009   Hypercholesterolemia without hypertriglyceridemia 07/06/2009   Absence of bladder continence 07/06/2009   Past Medical History:  Diagnosis Date   Closed left arm fracture    COPD (chronic obstructive pulmonary disease) (HCC)    Dehydration 09/15/2020   Glaucoma    Hyperlipidemia    Hypertension    Lung mass    Allergies  Allergen Reactions   Codeine Itching   Cyclobenzaprine Itching   Penicillins     Itching without rash per patient.  Unable to say when it happened, other than "a long time ago"       Medications: Outpatient Medications Prior to Visit  Medication Sig   albuterol (PROVENTIL) (2.5 MG/3ML) 0.083% nebulizer solution Take 3 mLs (2.5 mg total) by nebulization every 6 (six) hours as needed for wheezing or shortness of breath.   albuterol (VENTOLIN HFA) 108 (90 Base) MCG/ACT inhaler Inhale 2 puffs into the lungs every  6 (six) hours as needed for wheezing or shortness of breath.   aspirin 325 MG tablet Take 1 tablet (325 mg total) by mouth daily.   atorvastatin (LIPITOR) 40 MG tablet Take 1 tablet (40 mg total) by mouth daily.   Budeson-Glycopyrrol-Formoterol (BREZTRI AEROSPHERE) 160-9-4.8 MCG/ACT AERO Inhale 2 puffs into the lungs in the morning and at bedtime.   diltiazem (CARDIZEM) 120 MG tablet Take 1 tablet (120 mg total) by mouth daily.   dorzolamide (TRUSOPT) 2 % ophthalmic solution Place 1 drop into both eyes 2 (two) times daily.   HYDROcodone-acetaminophen (NORCO) 7.5-325 MG tablet Take 1 tablet by mouth every 6 (six) hours as needed for severe pain. Must last 30 days    latanoprost (XALATAN) 0.005 % ophthalmic solution Place 1 drop into both eyes at bedtime.   magnesium gluconate (MAGONATE) 500 MG tablet Take 500 mg by mouth daily.   meloxicam (MOBIC) 15 MG tablet Take 1 tablet (15 mg total) by mouth daily.   mirtazapine (REMERON) 7.5 MG tablet Take 1 tablet (7.5 mg total) by mouth at bedtime.   Multiple Vitamin (MULTI-VITAMIN) tablet Take 1 tablet by mouth daily.    omeprazole (PRILOSEC) 40 MG capsule Take 1 capsule (40 mg total) by mouth daily.   pregabalin (LYRICA) 150 MG capsule Take 1 capsule (150 mg total) by mouth every 8 (eight) hours.   ramipril (ALTACE) 2.5 MG capsule Take 1 capsule (2.5 mg total) by mouth daily.   rOPINIRole (REQUIP) 2 MG tablet Take 1 tablet (2 mg total) by mouth 2 (two) times daily. (Patient taking differently: Take 2 mg by mouth at bedtime.)   spironolactone (ALDACTONE) 25 MG tablet Take 1 tablet (25 mg total) by mouth daily.   tizanidine (ZANAFLEX) 2 MG capsule Take 1 capsule (2 mg total) by mouth 3 (three) times daily as needed for muscle spasms.   torsemide (DEMADEX) 20 MG tablet Take 2 tablets (40 mg total) by mouth 2 (two) times daily.   vitamin B-12 (CYANOCOBALAMIN) 500 MCG tablet Take 1 tablet (500 mcg total) by mouth daily.   No facility-administered medications prior to visit.    Review of Systems  Constitutional:  Negative for chills, fatigue and fever.  HENT:  Negative for congestion, ear pain, rhinorrhea, sinus pain and sore throat.   Respiratory:  Negative for cough, shortness of breath and wheezing.   Cardiovascular:  Negative for chest pain and leg swelling.  Gastrointestinal:  Negative for abdominal pain, blood in stool, diarrhea, nausea and vomiting.  Genitourinary:  Negative for dysuria, flank pain, frequency and urgency.  Neurological:  Negative for dizziness and headaches.      Objective    BP (!) 109/58   Pulse 78   Temp 98.1 F (36.7 C) (Oral)   Resp 16   Wt 91 lb 3.2 oz (41.4 kg)   SpO2 94%    BMI 18.42 kg/m     Physical Exam Vitals reviewed.  Constitutional:      General: She is not in acute distress.    Appearance: Normal appearance. She is well-developed. She is not diaphoretic.  HENT:     Head: Normocephalic and atraumatic.  Eyes:     General: No scleral icterus.    Conjunctiva/sclera: Conjunctivae normal.  Neck:     Thyroid: No thyromegaly.  Cardiovascular:     Rate and Rhythm: Normal rate and regular rhythm.     Pulses: Normal pulses.     Heart sounds: Normal heart sounds. No murmur heard. Pulmonary:  Effort: Pulmonary effort is normal. No respiratory distress.     Breath sounds: Wheezing (Coarse wheeze in all lung fields) present. No rhonchi or rales.  Musculoskeletal:     Cervical back: Neck supple.     Right lower leg: No edema.     Left lower leg: No edema.  Lymphadenopathy:     Cervical: No cervical adenopathy.  Skin:    General: Skin is warm and dry.     Findings: No rash.  Neurological:     Mental Status: She is alert and oriented to person, place, and time. Mental status is at baseline.  Psychiatric:        Mood and Affect: Mood normal.        Behavior: Behavior normal.  Wide-based gait   No results found for any visits on 05/14/21.  Assessment & Plan     Problem List Items Addressed This Visit       Cardiovascular and Mediastinum   Essential hypertension - Primary    Low today with hypovolemic hypotension Advised her to hold her torsemide x2 to 3 days Advised her to hydrate well Recheck metabolic panel       Relevant Orders   Comprehensive metabolic panel   Hypotension    New problem Likely from low intake of water and continue torsemide use Advised her to hold torsemide for 3 days and reassess Needs follow-up with cardiology       Relevant Orders   Ambulatory referral to Home Health     Digestive   Dysphagia    New problem Reports difficulty swallowing especially with solids Also losing weight and back to drinking  alcohol Referral to GI for further evaluation including likely need for EGD       Relevant Orders   Ambulatory referral to Gastroenterology     Other   Alcoholic (HCC)    Reports that she has been sober for about 1 year, but has started drinking a few glasses of wine per night recently enocuraged cessation       Hypercholesterolemia without hypertriglyceridemia    Continue statin Recheck lipid panel and CMP Goal LDL less than 100       Relevant Orders   Lipid panel   Comprehensive metabolic panel   Tobacco use disorder    Counseling given Cessation encouraged       Severe protein-calorie malnutrition [name](Gomez: less than 60% of standard weight) (HCC)    Patient reports losing weight again May be related to dysphagia or alcohol intake Continue Remeron Consider nutrition visit       Underweight    Discussed importance of healthy weight management Discussed diet and exercise        Relevant Orders   Ambulatory referral to Home Health   Other Visit Diagnoses     Widebased gait       Relevant Orders   Ambulatory referral to Home Health        Return in about 3 months (around 08/14/2021) for chronic disease f/u, With new PCP.      Danelle EarthlyI,Rebekah Moorehead,acting as a Neurosurgeonscribe for Shirlee LatchAngela Trejuan Matherne, MD.,have documented all relevant documentation on the behalf of Shirlee Latchngela Arletta Lumadue, MD,as directed by  Shirlee LatchAngela Ringo Sherod, MD while in the presence of Shirlee LatchAngela Clayvon Parlett, MD.   Total time spent on today's visit was greater than 40 minutes, including both face-to-face time and nonface-to-face time personally spent on review of chart (labs and imaging), discussing labs and goals, discussing further work-up, treatment options, referrals to specialist  if needed, reviewing outside records of pertinent, answering patient's questions, and coordinating care.    I, Shirlee Latch, MD, have reviewed all documentation for this visit. The documentation on 05/14/21 for the exam,  diagnosis, procedures, and orders are all accurate and complete.   Amalya Salmons, Marzella Schlein, MD, MPH [facility]

## 2021-05-14 NOTE — Assessment & Plan Note (Signed)
Patient reports losing weight again May be related to dysphagia or alcohol intake Continue Remeron Consider nutrition visit

## 2021-05-14 NOTE — Assessment & Plan Note (Signed)
New problem Likely from low intake of water and continue torsemide use Advised her to hold torsemide for 3 days and reassess Needs follow-up with cardiology

## 2021-05-14 NOTE — Assessment & Plan Note (Signed)
Reports that she has been sober for about 1 year, but has started drinking a few glasses of wine per night recently enocuraged cessation

## 2021-05-14 NOTE — Assessment & Plan Note (Signed)
New problem Reports difficulty swallowing especially with solids Also losing weight and back to drinking alcohol Referral to GI for further evaluation including likely need for EGD

## 2021-05-14 NOTE — Assessment & Plan Note (Signed)
Continue statin Recheck lipid panel and CMP Goal LDL less than 100

## 2021-05-14 NOTE — Assessment & Plan Note (Signed)
Low today with hypovolemic hypotension Advised her to hold her torsemide x2 to 3 days Advised her to hydrate well Recheck metabolic panel

## 2021-05-14 NOTE — Assessment & Plan Note (Signed)
Discussed importance of healthy weight management Discussed diet and exercise  

## 2021-05-14 NOTE — Assessment & Plan Note (Signed)
Counseling given Cessation encouraged

## 2021-05-17 ENCOUNTER — Telehealth: Payer: Self-pay

## 2021-05-17 ENCOUNTER — Encounter: Payer: Self-pay | Admitting: *Deleted

## 2021-05-17 NOTE — Progress Notes (Deleted)
PROVIDER NOTE: Information contained herein reflects review and annotations entered in association with encounter. Interpretation of such information and data should be left to medically-trained personnel. Information provided to patient can be located elsewhere in the medical record under "Patient Instructions". Document created using STT-dictation technology, any transcriptional errors that may result from process are unintentional.    Patient: Suzanne Avila  Service Category: E/M  Provider: Gaspar Cola, MD  DOB: 12-27-1945  DOS: 05/19/2021  Specialty: Interventional Pain Management  MRN: [id number]  Setting: Ambulatory outpatient  PCP: Virginia Crews, MD  Type: Established Patient    Referring Provider: Virginia Crews, MD  Location: Office  Delivery: Face-to-face     HPI  Suzanne Avila, a 75 y.o. year old female, is here today because of her No primary diagnosis found.. Suzanne Avila's primary complain today is No chief complaint on file. Last encounter: My last encounter with her was on 05/12/2021. 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); Lumbar Postlaminectomy syndrome (L4-5); Fibromyalgia; Chronic hip pain (Bilateral) (L>R); Neurogenic pain; Chronic lower extremity pain (Primary Area of Pain) (Bilateral) (L>R); Chronic upper extremity pain (Third area of Pain) (Bilateral) (L>R); Thoracic radiculitis; Pain in limb; Chronic sacroiliac joint pain (Left); Chronic groin pain (Left); Spondylosis without myelopathy or radiculopathy, lumbosacral region; Other specified  dorsopathies, sacral and sacrococcygeal region; DDD (degenerative disc disease), lumbosacral; Chronic low back pain (Left) w/o sciatica; Avascular necrosis of femoral head (Right) ([facility]); and 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 Pain Assessment: Severity of   is reported as a  /10. Location:    / . Onset:  . Quality:  . Timing:  . Modifying factor(s):  Marland Kitchen Vitals:  vitals were not taken for this visit.   Reason for encounter:  ***   ***  Pharmacotherapy Assessment  Analgesic: Hydrocodone/APAP 7.5/325, 1 tab PO q 6 hrs (30 mg/day of hydrocodone) MME/day: 30 mg/day.   Monitoring: [place] PMP: PDMP reviewed during this encounter.       Pharmacotherapy: No side-effects or adverse reactions reported. Compliance: No problems identified. Effectiveness: Clinically acceptable.  No notes on file  UDS:  Summary  Date Value Ref Range Status  05/06/2020 Note  Final    Comment:    ==================================================================== [employer] [facility] 13 (MW) ==================================================================== Specimen Alert Note: Urinary creatinine is low; ability to detect some drugs may be compromised. Interpret results with caution. (Creatinine) ==================================================================== Test                             Result       Flag       Units  Drug Present and Declared for Prescription Verification   Hydrocodone                    3460         EXPECTED   ng/mg creat   Hydromorphone                  1380         EXPECTED   ng/mg creat   Dihydrocodeine  580          EXPECTED   ng/mg creat   Norhydrocodone                 2430         EXPECTED   ng/mg creat    Sources of hydrocodone include scheduled prescription medications.    Hydromorphone, dihydrocodeine and norhydrocodone are expected    metabolites of hydrocodone. Hydromorphone and dihydrocodeine are    also available as  scheduled prescription medications.  ==================================================================== Test                      Result    Flag   Units      Ref Range   Creatinine              10        LL     mg/dL      >=20 ==================================================================== Declared Medications:  The flagging and interpretation on this report are based on the  following declared medications.  Unexpected results may arise from  inaccuracies in the declared medications.   **Note: The testing scope of this panel includes these medications:   Hydrocodone   **Note: The testing scope of this panel does not include the  following reported medications:   Acetaminophen  Albuterol  Aspirin  Atorvastatin  Cyanocobalamin  Diltiazem  Dorzolamide  Fluticasone (Advair)  Furosemide (Lasix)  Latanoprost  Magnesium  Meloxicam  Multivitamin  Omeprazole  Pregabalin (Lyrica)  Ramipril  Ropinirole  Salmeterol (Advair)  Spironolactone ==================================================================== For clinical consultation, please call [phone]. ====================================================================      ROS  Constitutional: Denies any fever or chills Gastrointestinal: No reported hemesis, hematochezia, vomiting, or acute GI distress Musculoskeletal: Denies any acute onset joint swelling, redness, loss of ROM, or weakness Neurological: No reported episodes of acute onset apraxia, aphasia, dysarthria, agnosia, amnesia, paralysis, loss of coordination, or loss of consciousness  Medication Review  Budeson-Glycopyrrol-Formoterol, HYDROcodone-acetaminophen, Multi-Vitamin, albuterol, aspirin, atorvastatin, diltiazem, dorzolamide, latanoprost, magnesium gluconate, meloxicam, mirtazapine, omeprazole, pregabalin, rOPINIRole, ramipril, spironolactone, tizanidine, torsemide, and vitamin B-12  History Review  Allergy: Suzanne Avila is allergic to  codeine, cyclobenzaprine, and penicillins. Drug: Suzanne Avila  reports no history of drug use. Alcohol:  reports current alcohol use of about 35.0 standard drinks of alcohol per week. Tobacco:  reports that she has been smoking cigarettes. She has a 75.00 pack-year smoking history. She has never used smokeless tobacco. Social: Suzanne Avila  reports that she has been smoking cigarettes. She has a 75.00 pack-year smoking history. She has never used smokeless tobacco. She reports current alcohol use of about 35.0 standard drinks of alcohol per week. She reports that she does not use drugs. Medical:  has a past medical history of Closed left arm fracture, COPD (chronic obstructive pulmonary disease) ([place]), Dehydration (09/15/2020), Glaucoma, Hyperlipidemia, Hypertension, and Lung mass. Surgical: Suzanne Avila  has a past surgical history that includes Back surgery; Eye surgery; and Carpal tunnel release (Left). Family: family history includes Cancer in her father and mother.  Laboratory Chemistry Profile   Renal Lab Results  Component Value Date   BUN 25 01/20/2021   CREATININE 1.03 (H) 01/20/2021   BCR 24 01/20/2021   GFRAA 62 11/11/2020   GFRNONAA 54 (L) 11/11/2020    Hepatic Lab Results  Component Value Date   AST 16 01/20/2021   ALT 8 01/20/2021   ALBUMIN 4.1 01/20/2021   ALKPHOS 217 (H) 01/20/2021    Electrolytes Lab  Results  Component Value Date   NA 139 01/20/2021   K 4.5 01/20/2021   CL 95 (L) 01/20/2021   CALCIUM 10.1 01/20/2021   MG 1.6 (L) 09/17/2020   PHOS 2.5 09/17/2020    Bone Lab Results  Component Value Date   VD25OH 45.9 02/03/2020    Inflammation (CRP: Acute Phase) (ESR: Chronic Phase) Lab Results  Component Value Date   CRP <0.8 02/14/2017   ESRSEDRATE 20 02/14/2017   LATICACIDVEN 1.1 09/15/2020         Note: Above Lab results reviewed.  Recent Imaging Review  CT CHEST WO CONTRAST CLINICAL DATA:  Evaluate lung mass.  EXAM: CT CHEST WITHOUT  CONTRAST  TECHNIQUE: Multidetector CT imaging of the chest was performed following the standard protocol without IV contrast.  COMPARISON:  10/06/2020  FINDINGS: Cardiovascular: The heart size appears within normal limits. No pericardial effusion. Ascending thoracic aorta measures 3.5 cm, image 61/5. Aortic atherosclerosis. Coronary artery calcifications.  Mediastinum/Nodes: No enlarged mediastinal or axillary lymph nodes. Thyroid gland, trachea, and esophagus demonstrate no significant findings.  Lungs/Pleura: No pleural effusion, airspace consolidation, or atelectasis. Cavitary lesion within the right upper lobe with surrounding architectural distortion and scarring is again identified. On today's exam this measures 1.9 x 2.0 by 1.5 cm (volume = 3 cm^3), image 90/5. Previously this measured 3.0 x 3.5 by 3.5 cm (volume = 19 cm^3).  Upper Abdomen: No acute abnormality.  Musculoskeletal: Mild curvature of the thoracic spine. Mild superior endplate compression deformity is identified involving, image 84/7. This is new from 10/06/2020.  IMPRESSION: 1. Interval decrease in size of cavitary lesion in the right upper lobe consistent with resolving infection. 2. Coronary artery calcifications noted. 3. Mild superior endplate compression deformity involving T11 is new from 10/06/2020  Aortic Atherosclerosis (ICD10-I70.0).,  Electronically Signed   By: Kerby Moors M.D.   On: 12/23/2020 10:23 Note: Reviewed        Physical Exam  General appearance: Well nourished, well developed, and well hydrated. In no apparent acute distress Mental status: Alert, oriented x 3 (person, place, & time)       Respiratory: No evidence of acute respiratory distress Eyes: PERLA Vitals: There were no vitals taken for this visit. BMI: Estimated body mass index is 18.42 kg/m as calculated from the following:   Height as of 01/27/21: _0  (1.499 m).   Weight as of 05/14/21: 91 lb 3.2 oz (41.4  kg). Ideal: Female patients must weigh at least 45.5 kg to calculate ideal body weight  Assessment   Status Diagnosis  Controlled Controlled Controlled No diagnosis found.   Updated Problems: No problems updated.  Plan of Care  Problem-specific:  No problem-specific Assessment & Plan notes found for this encounter.  Suzanne Avila has a current medication list which includes the following long-term medication(s): albuterol, albuterol, atorvastatin, diltiazem, hydrocodone-acetaminophen, mirtazapine, omeprazole, pregabalin, ramipril, ropinirole, spironolactone, and torsemide.  Pharmacotherapy (Medications Ordered): No orders of the defined types were placed in this encounter.  Orders:  No orders of the defined types were placed in this encounter.  Follow-up plan:   No follow-ups on file.     Considering:   Diagnostic bilateral IA hip joint injection  Possible bilateral hip joint RFA  Possible bilateral lumbar facet RFA  Possible left SI joint RFA  Diagnostic bilateral L4-L5 TFESI  Diagnostic caudal ESI  Diagnostic caudal epidurogram  Possible RACZ epidural lysis of adhesions  Diagnostic left L2-3 LESI  Possible bilateral  Lumbar SCS trial implant  Palliative PRN treatment(s):   Diagnostic right lumbar facet block #2  Diagnostic left lumbar facet block #3  Diagnostic left SI joint block #3         Recent Visits Date Type Provider Dept  05/04/21 Telemedicine Milinda Pointer, MD [facility]  Showing recent visits within past 90 days and meeting all other requirements Future Appointments Date Type Provider Dept  05/19/21 Appointment Milinda Pointer, MD [facility]  06/01/21 Appointment Milinda Pointer, MD [facility]  Showing future appointments within next 90 days and meeting all other requirements I discussed the assessment and treatment plan with the patient. The patient was provided an opportunity to ask questions and  all were answered. The patient agreed with the plan and demonstrated an understanding of the instructions.  Patient advised to call back or seek an in-person evaluation if the symptoms or condition worsens.  Duration of encounter: *** minutes.  Note by: Gaspar Cola, MD Date: 05/19/2021; Time: 6:20 PM

## 2021-05-17 NOTE — Progress Notes (Signed)
Chronic Care Management Pharmacy Assistant   Name: MAHREEN SCHEWE  MRN: [id number] DOB: 1945/11/06  Reason for Encounter:  Hypertension  Disease State Call   Recent office visits:  05/14/2021 Shirlee Latch, MD (PCP Office visit) for Hypertension.- Patient instructed to hold her Torsemide X 3 days and f/u with Cardio; Home Health Referral ordered  Recent consult visits:  05/04/2021 Delano Metz, MD (Pain Management)-  Diltiazem increased to 120 mg daily; Furosemide 20 mg discontinued  due to patient  not taking   Hospital visits:  None in previous 6 months  Medications: Outpatient Encounter Medications as of 05/17/2021  Medication Sig   albuterol (PROVENTIL) (2.5 MG/3ML) 0.083% nebulizer solution Take 3 mLs (2.5 mg total) by nebulization every 6 (six) hours as needed for wheezing or shortness of breath.   albuterol (VENTOLIN HFA) 108 (90 Base) MCG/ACT inhaler Inhale 2 puffs into the lungs every 6 (six) hours as needed for wheezing or shortness of breath.   aspirin 325 MG tablet Take 1 tablet (325 mg total) by mouth daily.   atorvastatin (LIPITOR) 40 MG tablet Take 1 tablet (40 mg total) by mouth daily.   Budeson-Glycopyrrol-Formoterol (BREZTRI AEROSPHERE) 160-9-4.8 MCG/ACT AERO Inhale 2 puffs into the lungs in the morning and at bedtime.   diltiazem (CARDIZEM) 120 MG tablet Take 1 tablet (120 mg total) by mouth daily.   dorzolamide (TRUSOPT) 2 % ophthalmic solution Place 1 drop into both eyes 2 (two) times daily.   HYDROcodone-acetaminophen (NORCO) 7.5-325 MG tablet Take 1 tablet by mouth every 6 (six) hours as needed for severe pain. Must last 30 days   latanoprost (XALATAN) 0.005 % ophthalmic solution Place 1 drop into both eyes at bedtime.   magnesium gluconate (MAGONATE) 500 MG tablet Take 500 mg by mouth daily.   meloxicam (MOBIC) 15 MG tablet Take 1 tablet (15 mg total) by mouth daily.   mirtazapine (REMERON) 7.5 MG tablet Take 1 tablet (7.5 mg total) by mouth at  bedtime.   Multiple Vitamin (MULTI-VITAMIN) tablet Take 1 tablet by mouth daily.    omeprazole (PRILOSEC) 40 MG capsule Take 1 capsule (40 mg total) by mouth daily.   pregabalin (LYRICA) 150 MG capsule Take 1 capsule (150 mg total) by mouth every 8 (eight) hours.   ramipril (ALTACE) 2.5 MG capsule Take 1 capsule (2.5 mg total) by mouth daily.   rOPINIRole (REQUIP) 2 MG tablet Take 1 tablet (2 mg total) by mouth 2 (two) times daily. (Patient taking differently: Take 2 mg by mouth at bedtime.)   spironolactone (ALDACTONE) 25 MG tablet Take 1 tablet (25 mg total) by mouth daily.   tizanidine (ZANAFLEX) 2 MG capsule Take 1 capsule (2 mg total) by mouth 3 (three) times daily as needed for muscle spasms.   torsemide (DEMADEX) 20 MG tablet Take 2 tablets (40 mg total) by mouth 2 (two) times daily.   vitamin B-12 (CYANOCOBALAMIN) 500 MCG tablet Take 1 tablet (500 mcg total) by mouth daily.   No facility-administered encounter medications on file as of 05/17/2021.   Care Gaps: TETANUS/TDAP COLONOSCOPY (last completed 01/30/2011) COVID-19 Vaccine Booster 4 Mammogram (last completed 03/17/2020)  Star Rating Drugs: Atorvastatin 40 mg last filled on 04/03/2021 for a 90-Day supply at [facility] Ramipril 2.5 mg last filled 04/01/2201 for a 90- Day supply at [facility] Reviewed chart prior to disease state call. Spoke with patient regarding BP  Recent Office Vitals: BP Readings from Last 3 Encounters:  05/14/21 (!) 109/58  01/27/21 (!) 115/50  01/20/21 (!) 175/90   Pulse Readings from Last 3 Encounters:  05/14/21 78  01/27/21 79  01/20/21 77    Wt Readings from Last 3 Encounters:  05/14/21 91 lb 3.2 oz (41.4 kg)  01/27/21 93 lb (42.2 kg)  01/20/21 93 lb 6.4 oz (42.4 kg)     Kidney Function Lab Results  Component Value Date/Time   CREATININE 1.03 (H) 01/20/2021 02:32 PM   CREATININE 1.02 (H) 11/11/2020 02:48 PM   CREATININE 0.91 03/22/2014 10:45 PM    GFRNONAA 54 (L) 11/11/2020 02:48 PM   GFRNONAA >60 09/17/2020 05:45 AM   GFRNONAA >60 03/22/2014 10:45 PM   GFRAA 62 11/11/2020 02:48 PM   GFRAA >60 03/22/2014 10:45 PM    BMP Latest Ref Rng & Units 01/20/2021 11/11/2020 09/17/2020  Glucose 65 - 99 mg/dL 82 87 74  BUN 8 - 27 mg/dL 25 16 9   Creatinine 0.57 - 1.00 mg/dL ) 7.00(F) 7.49(S  BUN/Creat Ratio 12 - 28 24 16  -  Sodium 134 - 144 mmol/L 139 134 133(L)  Potassium 3.5 - 5.2 mmol/L 4.5 3.9 3.8  Chloride 96 - 106 mmol/L 95(L) 93(L) 106  CO2 20 - 29 mmol/L 26 26 20(L)  Calcium 8.7 - 10.3 mg/dL 4.96 9.8 8.1(L)    Current antihypertensive regimen:  Diltiazem 120 mg daily Ramipril 2.5 mg daily Spironolactone 25 mg daily Torsemide 40 mg twice daily  How often are you checking your Blood Pressure?  Patient hasn't checked it  Current home BP readings: Patient was seen by PCP on 07/22 and her reading at that time was 109/58  What recent interventions/DTPs have been made by any provider to improve Blood Pressure control since last CPP Visit: Patient was instructed to hold her Torsemide X 3 days and contact Cardio  Any recent hospitalizations or ED visits since last visit with CPP? No  What diet changes have been made to improve Blood Pressure Control?  Patient states that she doesn't eat much, but she is not on any certain diets  What exercise is being done to improve your Blood Pressure Control?  Patient was wheezing a little today. I did ask her was she okay, and was she having issues with breathing and she stated she had just come back in from out side. She doesn't really exercise she is able to move around some. [facility] was ordered by the PCP for PT to assist with Gait training and stair climbing. Patient stated that at this time she has not heard from any [facility] agencies.   Adherence Review: Is the patient currently on ACE/ARB medication? Yes Does the patient have >5 day gap between last estimated fill dates? No  Patient reports  that she has not been able to get her Tizanidine and she stated that she received a letter from [facility] stating that there was a denial. In review of the patients chart the prescription was sent to [facility]. I informed the patient that I would give [facility] a call to see if they have filled the prescription for her. Spoke to [place] at [facility] and was informed that the 2 mg Capsules are not covered and would need a PA. The representative transferred me over to the PA Department to complete the PA over the phone but when speaking with the PA representative she advised that the tablets were covered by insurance but the capsules are not. I was able to contact [facility] back and spoke with [id number] the  Pharmacy Tech and was able per Angelena Sole, CPP to get her prescription changed from Take 1 capsule by mouth three times daily prn for muscle spasms to Take 1 TABLET by mouth three times daily prn for muscle spasms.   I contacted the patient back to inform her of the delay and to advise that I was able to get her prescription taken care of, and that if she does not receive the Rx to contact me at [phone].   The patient also is requesting to see if Dr. Beryle Flock ordered a Mammogram for her. I contacted the providers office to request the message be sent that the patient does want to have mammogram ordered. I spoke to [place] and she stated she would get the message over regarding the patient wanting a referral for a Mammogram.  Reschedule with Trinna Post 06/29/2021 @ 1230  07/25 spoke with patient's spouse and he stated she wasn't available.  Adelene Idler, CPA/CMA Clinical Pharmacist Assistant Phone: [phone]

## 2021-05-19 ENCOUNTER — Ambulatory Visit: Payer: Medicare Other | Admitting: Pain Medicine

## 2021-05-21 ENCOUNTER — Telehealth: Payer: Self-pay

## 2021-05-21 NOTE — Telephone Encounter (Signed)
[facility] faxed refill request for the following medications:  meloxicam (MOBIC) 15 MG tablet   Please advise.  

## 2021-05-22 LAB — COMPREHENSIVE METABOLIC PANEL
ALT: 11 IU/L (ref 0–32)
AST: 15 IU/L (ref 0–40)
Albumin/Globulin Ratio: 1.9 (ref 1.2–2.2)
Albumin: 4.1 g/dL (ref 3.7–4.7)
Alkaline Phosphatase: 130 IU/L — ABNORMAL HIGH (ref 44–121)
BUN/Creatinine Ratio: 18 (ref 12–28)
BUN: 17 mg/dL (ref 8–27)
Bilirubin Total: 0.4 mg/dL (ref 0.0–1.2)
CO2: 22 mmol/L (ref 20–29)
Calcium: 9.2 mg/dL (ref 8.7–10.3)
Chloride: 103 mmol/L (ref 96–106)
Creatinine, Ser: 0.97 mg/dL (ref 0.57–1.00)
Globulin, Total: 2.2 g/dL (ref 1.5–4.5)
Glucose: 79 mg/dL (ref 65–99)
Potassium: 5 mmol/L (ref 3.5–5.2)
Sodium: 140 mmol/L (ref 134–144)
Total Protein: 6.3 g/dL (ref 6.0–8.5)
eGFR: 61 mL/min/{1.73_m2} (ref >59)

## 2021-05-22 LAB — LIPID PANEL
Chol/HDL Ratio: 2.5 ratio (ref 0.0–4.4)
Cholesterol, Total: 142 mg/dL (ref 100–199)
HDL: 57 mg/dL (ref >39)
LDL Chol Calc (NIH): 68 mg/dL (ref 0–99)
Triglycerides: 92 mg/dL (ref 0–149)
VLDL Cholesterol Cal: 17 mg/dL (ref 5–40)

## 2021-05-24 NOTE — Telephone Encounter (Signed)
This was just filled on 05/04/21. Please review. Thanks!

## 2021-05-31 NOTE — Progress Notes (Signed)
PROVIDER NOTE: Information contained herein reflects review and annotations entered in association with encounter. Interpretation of such information and data should be left to medically-trained personnel. Information provided to patient can be located elsewhere in the medical record under "Patient Instructions". Document created using STT-dictation technology, any transcriptional errors that may result from process are unintentional.    Patient: Suzanne Avila  Service Category: E/M  Provider: Oswaldo Done, MD  DOB: [birth year]  DOS: 06/01/2021  Specialty: Interventional Pain Management  MRN: [id number]  Setting: Ambulatory outpatient  PCP: Erasmo Downer, MD  Type: Established Patient    Referring Provider: Erasmo Downer, MD  Location: Office  Delivery: Face-to-face     HPI  [name], a 75 y.o. year old female, is here today because of her Chronic pain syndrome [G89.4]. [name] primary complain today is Back Pain (Mid - lower back. ) Last encounter: My last encounter with her was on 05/12/2021. Pertinent problems: Ms. 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); Lumbar Postlaminectomy syndrome (L4-5); Fibromyalgia; Chronic hip pain (Bilateral) (L>R); Neurogenic pain; Chronic lower extremity pain (1ry area of Pain) (Bilateral) (L>R); Chronic upper extremity pain (3ry area of Pain) (Bilateral) (L>R); Thoracic radiculitis; Pain in limb; Chronic sacroiliac joint pain (Left); Chronic groin pain (Left); Spondylosis without myelopathy or radiculopathy, lumbosacral region; Other specified dorsopathies,  sacral and sacrococcygeal region; DDD (degenerative disc disease), lumbosacral; Chronic low back pain (Left) w/o sciatica; 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 Pain Assessment: Severity of Chronic pain is reported as a 9 /10. Location: Back Mid, Lower, Left, Right/into the buttocks and down the left leg. Onset: More than a month ago. Quality: Discomfort, Constant, Sharp, Burning. Timing: Constant. Modifying factor(s): rest. Vitals:  weight is 91 lb (41.3 kg). Her temporal temperature is 97.2 F (36.2 C) (abnormal). Her blood pressure is 106/46 (abnormal) and her pulse is 77. Her respiration is 16 and oxygen saturation is 97%.   Reason for encounter: medication management.   The patient indicates doing well with the current medication regimen. No adverse reactions or side effects reported to the medications.   Today the patient indicates having a flareup of her left-sided low back pain and radicular symptoms affecting the L2 and L3 nerve roots.  The patient does have a history of a 7.0 mm Lumbar central spinal stenosis at L2-3 with claudication.  She indicates currently being unable to ambulate at home and do her chores.  Today she stated that it takes her several hours to cook as simple meal due to her inability to stand up by herself.  Today she had requested that we bring her in for and interventional treatment since she has gotten to the point where she is unable to walk.  In addition to this, will go ahead and order a CT of her lumbar spine to updates her degenerative changes as well as to remeasure her central spinal stenosis.  It is my impression that she will probably need decompression.  UDS ordered today.   RTCB: 09/01/2021 Nonopioids transferred 07/27/2020: Lyrica  Pharmacotherapy Assessment  Analgesic: Hydrocodone/APAP 7.5/325, 1 tab  PO q 6 hrs (30 mg/day of hydrocodone) MME/day: 30 mg/day.   Monitoring: [place] PMP: PDMP reviewed during this encounter.       Pharmacotherapy: No side-effects or adverse reactions reported. Compliance: No problems identified. Effectiveness: Clinically acceptable.  Janett Billow, RN  06/01/2021  2:32 PM  Sign when Signing Visit Nursing Pain Medication Assessment:  Safety precautions to be maintained throughout the outpatient stay will include: orient to surroundings, keep bed in low position, maintain call bell within reach at all times, provide assistance with transfer out of bed and ambulation.  Medication Inspection Compliance: Pill count conducted under aseptic conditions, in front of the patient. Neither the pills nor the bottle was removed from the patient's sight at any time. Once count was completed pills were immediately returned to the patient in their original bottle.  Medication: Hydrocodone/APAP Pill/Patch Count:  12 of 120 pills remain Pill/Patch Appearance: Markings consistent with prescribed medication Bottle Appearance: Standard pharmacy container. Clearly labeled. Filled Date: 07 / 12 / 2022 Last Medication intake:  Today    UDS:  Summary  Date Value Ref Range Status  05/06/2020 Note  Final    Comment:    ==================================================================== [employer] Select 13 (MW) ==================================================================== Specimen Alert Note: Urinary creatinine is low; ability to detect some drugs may be compromised. Interpret results with caution. (Creatinine) ==================================================================== Test                             Result       Flag       Units  Drug Present and Declared for Prescription Verification   Hydrocodone                    3460         EXPECTED   ng/mg creat   Hydromorphone                  1380         EXPECTED   ng/mg creat   Dihydrocodeine                 580           EXPECTED   ng/mg creat   Norhydrocodone                 2430         EXPECTED   ng/mg creat    Sources of hydrocodone include scheduled prescription medications.    Hydromorphone, dihydrocodeine and norhydrocodone are expected    metabolites of hydrocodone. Hydromorphone and dihydrocodeine are    also available as scheduled prescription medications.  ==================================================================== Test                      Result    Flag   Units      Ref Range   Creatinine              10        LL     mg/dL      >=20 ==================================================================== Declared Medications:  The flagging and interpretation on this report are based on the  following declared medications.  Unexpected results may arise from  inaccuracies in the declared medications.   **Note: The testing scope of this panel includes these medications:   Hydrocodone   **Note: The testing scope of this panel does not include the  following  reported medications:   Acetaminophen  Albuterol  Aspirin  Atorvastatin  Cyanocobalamin  Diltiazem  Dorzolamide  Fluticasone (Advair)  Furosemide (Lasix)  Latanoprost  Magnesium  Meloxicam  Multivitamin  Omeprazole  Pregabalin (Lyrica)  Ramipril  Ropinirole  Salmeterol (Advair)  Spironolactone ==================================================================== For clinical consultation, please call [phone]. ====================================================================      ROS  Constitutional: Denies any fever or chills Gastrointestinal: No reported hemesis, hematochezia, vomiting, or acute GI distress Musculoskeletal: Denies any acute onset joint swelling, redness, loss of ROM, or weakness Neurological: No reported episodes of acute onset apraxia, aphasia, dysarthria, agnosia, amnesia, paralysis, loss of coordination, or loss of consciousness  Medication Review   Budeson-Glycopyrrol-Formoterol, HYDROcodone-acetaminophen, Multi-Vitamin, albuterol, aspirin, atorvastatin, diltiazem, dorzolamide, latanoprost, magnesium gluconate, meloxicam, mirtazapine, omeprazole, pregabalin, rOPINIRole, ramipril, spironolactone, tizanidine, torsemide, and vitamin B-12  History Review  Allergy: Suzanne Avila is allergic to codeine, cyclobenzaprine, and penicillins. Drug: Suzanne Avila  reports no history of drug use. Alcohol:  reports current alcohol use of about 35.0 standard drinks of alcohol per week. Tobacco:  reports that she has been smoking cigarettes. She has a 75.00 pack-year smoking history. She has never used smokeless tobacco. Social: Suzanne Avila  reports that she has been smoking cigarettes. She has a 75.00 pack-year smoking history. She has never used smokeless tobacco. She reports current alcohol use of about 35.0 standard drinks of alcohol per week. She reports that she does not use drugs. Medical:  has a past medical history of Closed left arm fracture, COPD (chronic obstructive pulmonary disease) ([place]), Dehydration (09/15/2020), Glaucoma, Hyperlipidemia, Hypertension, and Lung mass. Surgical: Suzanne Avila  has a past surgical history that includes Back surgery; Eye surgery; and Carpal tunnel release (Left). Family: family history includes Cancer in her father and mother.  Laboratory Chemistry Profile   Renal Lab Results  Component Value Date   BUN 17 05/21/2021   CREATININE 0.97 05/21/2021   BCR 18 05/21/2021   GFRAA 62 11/11/2020   GFRNONAA 54 (L) 11/11/2020    Hepatic Lab Results  Component Value Date   AST 15 05/21/2021   ALT 11 05/21/2021   ALBUMIN 4.1 05/21/2021   ALKPHOS 130 (H) 05/21/2021    Electrolytes Lab Results  Component Value Date   NA 140 05/21/2021   K 5.0 05/21/2021   CL 103 05/21/2021   CALCIUM 9.2 05/21/2021   MG 1.6 (L) 09/17/2020   PHOS 2.5 09/17/2020    Bone Lab Results  Component Value Date   VD25OH 45.9  02/03/2020    Inflammation (CRP: Acute Phase) (ESR: Chronic Phase) Lab Results  Component Value Date   CRP <0.8 02/14/2017   ESRSEDRATE 20 02/14/2017   LATICACIDVEN 1.1 09/15/2020         Note: Above Lab results reviewed.  Recent Imaging Review  CT CHEST WO CONTRAST CLINICAL DATA:  Evaluate lung mass.  EXAM: CT CHEST WITHOUT CONTRAST  TECHNIQUE: Multidetector CT imaging of the chest was performed following the standard protocol without IV contrast.  COMPARISON:  10/06/2020  FINDINGS: Cardiovascular: The heart size appears within normal limits. No pericardial effusion. Ascending thoracic aorta measures 3.5 cm, image 61/5. Aortic atherosclerosis. Coronary artery calcifications.  Mediastinum/Nodes: No enlarged mediastinal or axillary lymph nodes. Thyroid gland, trachea, and esophagus demonstrate no significant findings.  Lungs/Pleura: No pleural effusion, airspace consolidation, or atelectasis. Cavitary lesion within the right upper lobe with surrounding architectural distortion and scarring is again identified. On today's exam this measures 1.9 x 2.0 by 1.5 cm (volume = 3 cm^3), image 90/5.  Previously this measured 3.0 x 3.5 by 3.5 cm (volume = 19 cm^3).  Upper Abdomen: No acute abnormality.  Musculoskeletal: Mild curvature of the thoracic spine. Mild superior endplate compression deformity is identified involving, image 84/7. This is new from 10/06/2020.  IMPRESSION: 1. Interval decrease in size of cavitary lesion in the right upper lobe consistent with resolving infection. 2. Coronary artery calcifications noted. 3. Mild superior endplate compression deformity involving T11 is new from 10/06/2020  Aortic Atherosclerosis (ICD10-I70.0).,  Electronically Signed   By: Kerby Moors M.D.   On: 12/23/2020 10:23 Note: Reviewed        Physical Exam  General appearance: Well nourished, well developed, and well hydrated. In no apparent acute distress Mental  status: Alert, oriented x 3 (person, place, & time)       Respiratory: No evidence of acute respiratory distress Eyes: PERLA Vitals: BP (!) 106/46 (BP Location: Left Arm, Patient Position: Sitting, Cuff Size: Normal)   Pulse 77   Temp (!) 97.2 F (36.2 C) (Temporal)   Resp 16   Wt 91 lb (41.3 kg)   SpO2 97%   BMI 18.38 kg/m  BMI: Estimated body mass index is 18.38 kg/m as calculated from the following:   Height as of 01/27/21: $RemoveBe'4\' 11"'qvymiVCtx$  (1.499 m).   Weight as of this encounter: 91 lb (41.3 kg). Ideal: Female patients must weigh at least 45.5 kg to calculate ideal body weight  Assessment   Status Diagnosis  Controlled Controlled Controlled 1. Chronic pain syndrome   2. 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 Chronic lower extremity pain (Primary Area of Pain) (Bilateral) (L>R)   4. 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 Lumbar facet syndrome (Bilateral) (L>R)   6. Lumbosacral radiculopathy at L2 (Left)   7. Spinal stenosis of lumbar region with neurogenic claudication   8. Other intervertebral disc degeneration, lumbar region   9. Impaired ambulation   10. Pharmacologic therapy   11. Chronic use of opiate for therapeutic purpose   12. Encounter for chronic pain management   13. Encounter for medication management      Updated Problems: Problem  Lumbosacral radiculopathy at L2 (Left)  Impaired Ambulation  Chronic lower extremity pain (1ry area of Pain) (Bilateral) (L>R)  Chronic upper extremity pain (3ry area of Pain) (Bilateral) (L>R)    Plan of Care  Problem-specific:  No problem-specific Assessment & Plan notes found for this encounter.  Suzanne Avila has a current medication list which includes the following long-term medication(s): albuterol, albuterol, atorvastatin, diltiazem, [START ON 06/03/2021] hydrocodone-acetaminophen, [START ON 07/03/2021] hydrocodone-acetaminophen, [START ON 08/02/2021]  hydrocodone-acetaminophen, [START ON 09/01/2021] hydrocodone-acetaminophen, mirtazapine, omeprazole, pregabalin, ramipril, ropinirole, spironolactone, and torsemide.  Pharmacotherapy (Medications Ordered): Meds ordered this encounter  Medications   HYDROcodone-acetaminophen (NORCO) 7.5-325 MG tablet    Sig: Take 1 tablet by mouth every 6 (six) hours as needed for severe pain. Must last 30 days    Dispense:  120 tablet    Refill:  0    Not a duplicate. Do NOT delete! Dispense 1 day early if closed on fill date. Warn not to take CNS-depressants 8 hours before or after taking opioid. Do not send refill request. Renewal requires appointment.   HYDROcodone-acetaminophen (NORCO) 7.5-325 MG tablet    Sig: Take 1 tablet by mouth every 6 (six) hours as needed for severe pain. Must last 30 days    Dispense:  120 tablet    Refill:  0    Not a duplicate. Do NOT delete! Dispense 1 day early if closed on fill date. Warn not to take CNS-depressants 8 hours before or after taking opioid. Do not send refill request. Renewal requires appointment.   HYDROcodone-acetaminophen (NORCO) 7.5-325 MG tablet    Sig: Take 1 tablet by mouth every 6 (six) hours as needed for severe pain. Must last 30 days    Dispense:  120 tablet    Refill:  0    Not a duplicate. Do NOT delete! Dispense 1 day early if closed on fill date. Warn not to take CNS-depressants 8 hours before or after taking opioid. Do not send refill request. Renewal requires appointment.   HYDROcodone-acetaminophen (NORCO) 7.5-325 MG tablet    Sig: Take 1 tablet by mouth every 6 (six) hours as needed for severe pain. Must last 30 days    Dispense:  120 tablet    Refill:  0    Not a duplicate. Do NOT delete! Dispense 1 day early if closed on fill date. Warn not to take CNS-depressants 8 hours before or after taking opioid. Do not send refill request. Renewal requires appointment.    Orders:  Orders Placed This Encounter  Procedures   Lumbar Epidural  Injection    Standing Status:   Future    Standing Expiration Date:   07/02/2021    Scheduling Instructions:     Procedure: Interlaminar Lumbar Epidural Steroid injection (LESI)  L2-3     Laterality: Left-sided     Sedation: With Sedation.     Timeframe: ASAA    Order Specific Question:   Where will this procedure be performed?    Answer:   [facility]   CT LUMBAR SPINE WO CONTRAST    Patient presents with axial pain with possible radicular component. Please assist [place] in identifying specific level(s) and laterality of any additional findings such as: 1. Facet (Zygapophyseal) joint DJD (Hypertrophy, space narrowing, subchondral sclerosis, and/or osteophyte formation) 2. DDD and/or IVDD (Loss of disc height, desiccation, gas patterns, osteophytes, endplate sclerosis, or "Black disc disease") 3. Pars defects 4. Spondylolisthesis, spondylosis, and/or spondyloarthropathies (include Degree/Grade of displacement in mm) (stability) 5. Vertebral body Fractures (acute/chronic) (state percentage of collapse) 6. Demineralization (osteopenia/osteoporotic) 7. Bone pathology 8. Foraminal narrowing  9. Surgical changes 10. Central, Lateral Recess, and/or Foraminal Stenosis (include AP diameter of stenosis in mm) 11. Surgical changes (hardware type, status, and presence of fibrosis) 12. Modic Type Changes (MRI only) 13. IVDD (Disc bulge, protrusion, herniation, extrusion) (Level, laterality, extent)    Standing Status:   Future    Standing Expiration Date:   07/02/2021    Scheduling Instructions:     Imaging must be done as soon as possible. Inform patient that order will expire within 30 days and I will not renew it.    Order Specific Question:   Preferred imaging location?    Answer:   [facility]    Order Specific Question:   Call Results- Best Contact Number?    Answer:   [phone] ([facility])    Order Specific Question:   Radiology Contrast Protocol - do NOT remove  file path    Answer:   [email or web address]   [employer] Select 13 (MW), Urine    Volume: 30 ml(s). Minimum 3 ml of urine is needed. Document temperature of fresh sample. Indications: Long term (current) use of opiate analgesic (E72.094)    Order Specific Question:   Release to patient    Answer:  Immediate   Informed Consent Details: Physician/Practitioner Attestation; Transcribe to consent form and obtain patient signature    Note: Always confirm laterality of pain with Suzanne Avila, before procedure. Transcribe to consent form and obtain patient signature.    Order Specific Question:   Physician/Practitioner attestation of informed consent for procedure/surgical case    Answer:   I, the physician/practitioner, attest that I have discussed with the patient the benefits, risks, side effects, alternatives, likelihood of achieving goals and potential problems during recovery for the procedure that I have provided informed consent.    Order Specific Question:   Procedure    Answer:   Lumbar epidural steroid injection under fluoroscopic guidance    Order Specific Question:   Physician/Practitioner performing the procedure    Answer:   Llewellyn Choplin A. Dossie Arbour, MD    Order Specific Question:   Indication/Reason    Answer:   Low back and/or lower extremity pain secondary to lumbar radiculitis   Consult to social work    Please evaluate this patient for possible need for assistance in completing her household chores.    Follow-up plan:   Return in about 3 months (around 09/01/2021) for (F2F-MM) E/M-day (M,W), in addition, Procedure (w/ sedation): (L) L2-3 LESI.     Considering:   Diagnostic bilateral IA hip joint injection  Possible bilateral hip joint RFA  Possible bilateral lumbar facet RFA  Possible left SI joint RFA  Diagnostic bilateral L4-L5 TFESI  Diagnostic caudal ESI  Diagnostic caudal epidurogram  Possible RACZ epidural lysis of adhesions  Diagnostic left  L2-3 LESI  Possible bilateral  Lumbar SCS trial implant    Palliative PRN treatment(s):   Diagnostic right lumbar facet block #2  Diagnostic left lumbar facet block #3  Diagnostic left SI joint block #3          Recent Visits Date Type Provider Dept  05/04/21 Telemedicine Milinda Pointer, MD [facility]  Showing recent visits within past 90 days and meeting all other requirements Today's Visits Date Type Provider Dept  06/01/21 Office Visit Milinda Pointer, MD [facility]  Showing today's visits and meeting all other requirements Future Appointments No visits were found meeting these conditions. Showing future appointments within next 90 days and meeting all other requirements I discussed the assessment and treatment plan with the patient. The patient was provided an opportunity to ask questions and all were answered. The patient agreed with the plan and demonstrated an understanding of the instructions.  Patient advised to call back or seek an in-person evaluation if the symptoms or condition worsens.  Duration of encounter: 30 minutes.  Note by: Gaspar Cola, MD Date: 06/01/2021; Time: 2:51 PM

## 2021-06-01 ENCOUNTER — Ambulatory Visit: Payer: Medicare Other | Attending: Pain Medicine | Admitting: Pain Medicine

## 2021-06-01 ENCOUNTER — Encounter: Payer: Self-pay | Admitting: Pain Medicine

## 2021-06-01 ENCOUNTER — Other Ambulatory Visit: Payer: Self-pay

## 2021-06-01 VITALS — BP 106/46 | HR 77 | Temp 97.2°F | Resp 16 | Wt 91.0 lb

## 2021-06-01 DIAGNOSIS — M5442 Lumbago with sciatica, left side: Secondary | ICD-10-CM | POA: Diagnosis present

## 2021-06-01 DIAGNOSIS — M47816 Spondylosis without myelopathy or radiculopathy, lumbar region: Secondary | ICD-10-CM | POA: Diagnosis present

## 2021-06-01 DIAGNOSIS — M5441 Lumbago with sciatica, right side: Secondary | ICD-10-CM | POA: Diagnosis present

## 2021-06-01 DIAGNOSIS — R262 Difficulty in walking, not elsewhere classified: Secondary | ICD-10-CM | POA: Insufficient documentation

## 2021-06-01 DIAGNOSIS — M79604 Pain in right leg: Secondary | ICD-10-CM | POA: Insufficient documentation

## 2021-06-01 DIAGNOSIS — M48062 Spinal stenosis, lumbar region with neurogenic claudication: Secondary | ICD-10-CM | POA: Diagnosis present

## 2021-06-01 DIAGNOSIS — Z79899 Other long term (current) drug therapy: Secondary | ICD-10-CM | POA: Insufficient documentation

## 2021-06-01 DIAGNOSIS — M5417 Radiculopathy, lumbosacral region: Secondary | ICD-10-CM | POA: Insufficient documentation

## 2021-06-01 DIAGNOSIS — Z79891 Long term (current) use of opiate analgesic: Secondary | ICD-10-CM | POA: Insufficient documentation

## 2021-06-01 DIAGNOSIS — G894 Chronic pain syndrome: Secondary | ICD-10-CM | POA: Insufficient documentation

## 2021-06-01 DIAGNOSIS — M961 Postlaminectomy syndrome, not elsewhere classified: Secondary | ICD-10-CM | POA: Insufficient documentation

## 2021-06-01 DIAGNOSIS — M79605 Pain in left leg: Secondary | ICD-10-CM | POA: Diagnosis present

## 2021-06-01 DIAGNOSIS — G8929 Other chronic pain: Secondary | ICD-10-CM | POA: Diagnosis present

## 2021-06-01 DIAGNOSIS — M5136 Other intervertebral disc degeneration, lumbar region: Secondary | ICD-10-CM | POA: Insufficient documentation

## 2021-06-01 MED ORDER — HYDROCODONE-ACETAMINOPHEN 7.5-325 MG PO TABS: 1.0000 | ORAL_TABLET | Freq: Four times a day (QID) | ORAL | 0 refills | Status: DC | PRN

## 2021-06-01 MED ORDER — HYDROCODONE-ACETAMINOPHEN 7.5-325 MG PO TABS: 1.0000 | ORAL_TABLET | Freq: Four times a day (QID) | ORAL | 0 refills | Status: AC | PRN

## 2021-06-01 NOTE — Patient Instructions (Signed)
____________________________________________________________________________________________  General Risks and Possible Complications  Patient Responsibilities: It is important that you read this as it is part of your informed consent. It is our duty to inform you of the risks and possible complications associated with treatments offered to you. It is your responsibility as a patient to read this and to ask questions about anything that is not clear or that you believe was not covered in this document.  Patient's Rights: You have the right to refuse treatment. You also have the right to change your mind, even after initially having agreed to have the treatment done. However, under this last option, if you wait until the last second to change your mind, you may be charged for the materials used up to that point.  Introduction: Medicine is not an exact science. Everything in Medicine, including the lack of treatment(s), carries the potential for danger, harm, or loss (which is by definition: Risk). In Medicine, a complication is a secondary problem, condition, or disease that can aggravate an already existing one. All treatments carry the risk of possible complications. The fact that a side effects or complications occurs, does not imply that the treatment was conducted incorrectly. It must be clearly understood that these can happen even when everything is done following the highest safety standards.  No treatment: You can choose not to proceed with the proposed treatment alternative. The "PRO(s)" would include: avoiding the risk of complications associated with the therapy. The "CON(s)" would include: not getting any of the treatment benefits. These benefits fall under one of three categories: diagnostic; therapeutic; and/or palliative. Diagnostic benefits include: getting information which can ultimately lead to improvement of the disease or symptom(s). Therapeutic benefits are those associated with the  successful treatment of the disease. Finally, palliative benefits are those related to the decrease of the primary symptoms, without necessarily curing the condition (example: decreasing the pain from a flare-up of a chronic condition, such as incurable terminal cancer).  General Risks and Complications: These are associated to most interventional treatments. They can occur alone, or in combination. They fall under one of the following six (6) categories: no benefit or worsening of symptoms; bleeding; infection; nerve damage; allergic reactions; and/or death. No benefits or worsening of symptoms: In Medicine there are no guarantees, only probabilities. No healthcare provider can ever guarantee that a medical treatment will work, they can only state the probability that it may. Furthermore, there is always the possibility that the condition may worsen, either directly, or indirectly, as a consequence of the treatment. Bleeding: This is more common if the patient is taking a blood thinner, either prescription or over the counter (example: Goody Powders, Fish oil, Aspirin, Garlic, etc.), or if suffering a condition associated with impaired coagulation (example: Hemophilia, cirrhosis of the liver, low platelet counts, etc.). However, even if you do not have one on these, it can still happen. If you have any of these conditions, or take one of these drugs, make sure to notify your treating physician. Infection: This is more common in patients with a compromised immune system, either due to disease (example: diabetes, cancer, human immunodeficiency virus [HIV], etc.), or due to medications or treatments (example: therapies used to treat cancer and rheumatological diseases). However, even if you do not have one on these, it can still happen. If you have any of these conditions, or take one of these drugs, make sure to notify your treating physician. Nerve Damage: This is more common when the treatment is an invasive    one, but it can also happen with the use of medications, such as those used in the treatment of cancer. The damage can occur to small secondary nerves, or to large primary ones, such as those in the spinal cord and brain. This damage may be temporary or permanent and it may lead to impairments that can range from temporary numbness to permanent paralysis and/or brain death. Allergic Reactions: Any time a substance or material comes in contact with our body, there is the possibility of an allergic reaction. These can range from a mild skin rash (contact dermatitis) to a severe systemic reaction (anaphylactic reaction), which can result in death. Death: In general, any medical intervention can result in death, most of the time due to an unforeseen complication. ____________________________________________________________________________________________ ______________________________________________________________________  Preparing for Procedure with Sedation  NOTICE: Due to recent regulatory changes, starting on May 24, 2021, procedures requiring intravenous (IV) sedation will no longer be performed at the [facility].  These types of procedures are required to be performed at [facility] ambulatory surgery facility.  We are very sorry for the inconvenience.  Procedure appointments are limited to planned procedures: No Prescription Refills. No disability issues will be discussed. No medication changes will be discussed.  Instructions: Oral Intake: Do not eat or drink anything for at least 8 hours prior to your procedure. (Exception: Blood Pressure Medication. See below.) Transportation: A driver is required. You may not drive yourself after the procedure. Blood Pressure Medicine: Do not forget to take your blood pressure medicine with a sip of water the morning of the procedure. If your Diastolic (lower reading) is above 100 mmHg, elective cases will be cancelled/rescheduled. Blood thinners:  These will need to be stopped for procedures. Notify our staff if you are taking any blood thinners. Depending on which one you take, there will be specific instructions on how and when to stop it. Diabetics on insulin: Notify the staff so that you can be scheduled 1st case in the morning. If your diabetes requires high dose insulin, take only  of your normal insulin dose the morning of the procedure and notify the staff that you have done so. Preventing infections: Shower with an antibacterial soap the morning of your procedure. Build-up your immune system: Take 1000 mg of Vitamin C with every meal (3 times a day) the day prior to your procedure. Antibiotics: Inform the staff if you have a condition or reason that requires you to take antibiotics before dental procedures. Pregnancy: If you are pregnant, call and cancel the procedure. Sickness: If you have a cold, fever, or any active infections, call and cancel the procedure. Arrival: You must be in the facility at least 30 minutes prior to your scheduled procedure. Children: Do not bring children with you. Dress appropriately: Bring dark clothing that you would not mind if they get stained. Valuables: Do not bring any jewelry or valuables.  Reasons to call and reschedule or cancel your procedure: (Following these recommendations will minimize the risk of a serious complication.) Surgeries: Avoid having procedures within 2 weeks of any surgery. (Avoid for 2 weeks before or after any surgery). Flu Shots: Avoid having procedures within 2 weeks of a flu shots. (Avoid for 2 weeks before or after immunizations). Barium: Avoid having a procedure within 7-10 days after having had a radiological study involving the use of radiological contrast. (Myelograms, Barium swallow or enema study). Heart attacks: Avoid any elective procedures or surgeries for the initial 6 months after a "Myocardial Infarction" (Heart   Attack). Blood thinners: It is imperative that  you stop these medications before procedures. Let us know if you if you take any blood thinner.  Infection: Avoid procedures during or within two weeks of an infection (including chest colds or gastrointestinal problems). Symptoms associated with infections include: Localized redness, fever, chills, night sweats or profuse sweating, burning sensation when voiding, cough, congestion, stuffiness, runny nose, sore throat, diarrhea, nausea, vomiting, cold or Flu symptoms, recent or current infections. It is specially important if the infection is over the area that we intend to treat. Heart and lung problems: Symptoms that may suggest an active cardiopulmonary problem include: cough, chest pain, breathing difficulties or shortness of breath, dizziness, ankle swelling, uncontrolled high or unusually low blood pressure, and/or palpitations. If you are experiencing any of these symptoms, cancel your procedure and contact your primary care physician for an evaluation.  Remember:  Regular Business hours are:  Monday to Thursday 8:00 AM to 4:00 PM  Provider's Schedule: Francisco Naveira, MD:  Procedure days: Tuesday and Thursday 7:30 AM to 4:00 PM  Bilal Lateef, MD:  Procedure days: Monday and Wednesday 7:30 AM to 4:00 PM ______________________________________________________________________  Epidural Steroid Injection Patient Information  Description: The epidural space surrounds the nerves as they exit the spinal cord.  In some patients, the nerves can be compressed and inflamed by a bulging disc or a tight spinal canal (spinal stenosis).  By injecting steroids into the epidural space, we can bring irritated nerves into direct contact with a potentially helpful medication.  These steroids act directly on the irritated nerves and can reduce swelling and inflammation which often leads to decreased pain.  Epidural steroids may be injected anywhere along the spine and from the neck to the low back depending  upon the location of your pain.   After numbing the skin with local anesthetic (like Novocaine), a small needle is passed into the epidural space slowly.  You may experience a sensation of pressure while this is being done.  The entire block usually last less than 10 minutes.  Conditions which may be treated by epidural steroids:  Low back and leg pain Neck and arm pain Spinal stenosis Post-laminectomy syndrome Herpes zoster (shingles) pain Pain from compression fractures  Preparation for the injection:  Do not eat any solid food or dairy products within 8 hours of your appointment.  You may drink clear liquids up to 3 hours before appointment.  Clear liquids include water, black coffee, juice or soda.  No milk or cream please. You may take your regular medication, including pain medications, with a sip of water before your appointment  Diabetics should hold regular insulin (if taken separately) and take 1/2 normal NPH dos the morning of the procedure.  Carry some sugar containing items with you to your appointment. A driver must accompany you and be prepared to drive you home after your procedure.  Bring all your current medications with your. An IV may be inserted and sedation may be given at the discretion of the physician.   A blood pressure cuff, EKG and other monitors will often be applied during the procedure.  Some patients may need to have extra oxygen administered for a short period. You will be asked to provide medical information, including your allergies, prior to the procedure.  We must know immediately if you are taking blood thinners (like Coumadin/Warfarin)  Or if you are allergic to IV iodine contrast (dye). We must know if you could possible be pregnant.  Possible   side-effects: Bleeding from needle site Infection (rare, may require surgery) Nerve injury (rare) Numbness & tingling (temporary) Difficulty urinating (rare, temporary) Spinal headache ( a headache worse with  upright posture) Light -headedness (temporary) Pain at injection site (several days) Decreased blood pressure (temporary) Weakness in arm/leg (temporary) Pressure sensation in back/neck (temporary)  Call if you experience: Fever/chills associated with headache or increased back/neck pain. Headache worsened by an upright position. New onset weakness or numbness of an extremity below the injection site Hives or difficulty breathing (go to the emergency room) Inflammation or drainage at the infection site Severe back/neck pain Any new symptoms which are concerning to you  Please note:  Although the local anesthetic injected can often make your back or neck feel good for several hours after the injection, the pain will likely return.  It takes 3-7 days for steroids to work in the epidural space.  You may not notice any pain relief for at least that one week.  If effective, we will often do a series of three injections spaced 3-6 weeks apart to maximally decrease your pain.  After the initial series, we generally will wait several months before considering a repeat injection of the same type.  If you have any questions, please call [phone] [facility] 

## 2021-06-01 NOTE — Telephone Encounter (Signed)
[facility] faxed refill request for the following medications:  meloxicam (MOBIC) 15 MG tablet   Please advise.  

## 2021-06-01 NOTE — Progress Notes (Signed)
Nursing Pain Medication Assessment:  Safety precautions to be maintained throughout the outpatient stay will include: orient to surroundings, keep bed in low position, maintain call bell within reach at all times, provide assistance with transfer out of bed and ambulation.  Medication Inspection Compliance: Pill count conducted under aseptic conditions, in front of the patient. Neither the pills nor the bottle was removed from the patient's sight at any time. Once count was completed pills were immediately returned to the patient in their original bottle.  Medication: Hydrocodone/APAP Pill/Patch Count:  12 of 120 pills remain Pill/Patch Appearance: Markings consistent with prescribed medication Bottle Appearance: Standard pharmacy container. Clearly labeled. Filled Date: 07 / 12 / 2022 Last Medication intake:  Today

## 2021-06-02 MED ORDER — MELOXICAM 15 MG PO TABS: 15.0000 mg | ORAL_TABLET | Freq: Every day | ORAL | 6 refills | Status: DC

## 2021-06-02 MED ORDER — MELOXICAM 15 MG PO TABS: 15.0000 mg | ORAL_TABLET | Freq: Every day | ORAL | 1 refills | Status: DC

## 2021-06-02 NOTE — Addendum Note (Signed)
Addended by: Fonda Kinder on: 06/02/2021 03:13 PM   Modules accepted: Orders

## 2021-06-02 NOTE — Telephone Encounter (Signed)
Approval and refill sent originally to [facility], I cancelled prescription and spoke with pharmacist Jonny Ruiz  and advised him to void. Will send prescription to [facility]. KW

## 2021-06-02 NOTE — Telephone Encounter (Signed)
Ok to send refills x106m

## 2021-06-02 NOTE — Telephone Encounter (Signed)
Medication was last filled on 05/04/21 for 30day supply , patients lov was 05/14/21. Please review request. Suzanne Avila

## 2021-06-07 LAB — TOXASSURE SELECT 13 (MW), URINE

## 2021-06-11 ENCOUNTER — Telehealth: Payer: Self-pay

## 2021-06-11 NOTE — Progress Notes (Signed)
Chronic Care Management Pharmacy Assistant   Name: Suzanne Avila  MRN: [id number] DOB: 20-Oct-1946  Reason for Encounter: Hypertension Disease State Call   Recent office visits:  No recent office visits  Recent consult visits:  06/01/2021 Delano Metz, MD (Pain Management) for Back Pain- started 7.5-325 Mg tablets every 6 hours prn; labs ordered; Consult to SW ordered, patient instructed to return in 3 months.  Hospital visits:  None in previous 6 months  Medications: Outpatient Encounter Medications as of 06/11/2021  Medication Sig Note   albuterol (PROVENTIL) (2.5 MG/3ML) 0.083% nebulizer solution Take 3 mLs (2.5 mg total) by nebulization every 6 (six) hours as needed for wheezing or shortness of breath.    albuterol (VENTOLIN HFA) 108 (90 Base) MCG/ACT inhaler Inhale 2 puffs into the lungs every 6 (six) hours as needed for wheezing or shortness of breath.    aspirin 325 MG tablet Take 1 tablet (325 mg total) by mouth daily.    atorvastatin (LIPITOR) 40 MG tablet Take 1 tablet (40 mg total) by mouth daily.    Budeson-Glycopyrrol-Formoterol (BREZTRI AEROSPHERE) 160-9-4.8 MCG/ACT AERO Inhale 2 puffs into the lungs in the morning and at bedtime.    diltiazem (CARDIZEM) 120 MG tablet Take 1 tablet (120 mg total) by mouth daily.    dorzolamide (TRUSOPT) 2 % ophthalmic solution Place 1 drop into both eyes 2 (two) times daily.    HYDROcodone-acetaminophen (NORCO) 7.5-325 MG tablet Take 1 tablet by mouth every 6 (six) hours as needed for severe pain. Must last 30 days    [START ON 07/03/2021] HYDROcodone-acetaminophen (NORCO) 7.5-325 MG tablet Take 1 tablet by mouth every 6 (six) hours as needed for severe pain. Must last 30 days    [START ON 08/02/2021] HYDROcodone-acetaminophen (NORCO) 7.5-325 MG tablet Take 1 tablet by mouth every 6 (six) hours as needed for severe pain. Must last 30 days 06/01/2021: WARNING: Not a Duplicate. Future prescription. Do NOT Delete!! [facility] Patient    [START ON 09/01/2021] HYDROcodone-acetaminophen (NORCO) 7.5-325 MG tablet Take 1 tablet by mouth every 6 (six) hours as needed for severe pain. Must last 30 days 06/01/2021: WARNING: Not a Duplicate. Future prescription. Do NOT Delete!! [facility] Patient    latanoprost (XALATAN) 0.005 % ophthalmic solution Place 1 drop into both eyes at bedtime.    magnesium gluconate (MAGONATE) 500 MG tablet Take 500 mg by mouth daily.    meloxicam (MOBIC) 15 MG tablet Take 1 tablet (15 mg total) by mouth daily.    mirtazapine (REMERON) 7.5 MG tablet Take 1 tablet (7.5 mg total) by mouth at bedtime.    Multiple Vitamin (MULTI-VITAMIN) tablet Take 1 tablet by mouth daily.     omeprazole (PRILOSEC) 40 MG capsule Take 1 capsule (40 mg total) by mouth daily.    pregabalin (LYRICA) 150 MG capsule Take 1 capsule (150 mg total) by mouth every 8 (eight) hours.    ramipril (ALTACE) 2.5 MG capsule Take 1 capsule (2.5 mg total) by mouth daily.    rOPINIRole (REQUIP) 2 MG tablet Take 1 tablet (2 mg total) by mouth 2 (two) times daily. (Patient taking differently: Take 2 mg by mouth at bedtime.)    spironolactone (ALDACTONE) 25 MG tablet Take 1 tablet (25 mg total) by mouth daily.    tizanidine (ZANAFLEX) 2 MG capsule Take 1 capsule (2 mg total) by mouth 3 (three) times daily as needed for muscle spasms.    torsemide (DEMADEX) 20 MG tablet Take  2 tablets (40 mg total) by mouth 2 (two) times daily.    vitamin B-12 (CYANOCOBALAMIN) 500 MCG tablet Take 1 tablet (500 mcg total) by mouth daily.    No facility-administered encounter medications on file as of 06/11/2021.   Care Gaps: TETANUS/TDAP COLONOSCOPY (last completed 01/30/2011) COVID-19 Vaccine Booster 4 Mammogram (last completed 03/17/2020)  Star Rating Drugs: Atorvastatin 40 mg last filled on 04/03/2021 for a 90-Day supply at [facility] Ramipril 2.5 mg last filled 04/01/2201 for a [facility]- Day supply [facility]  Reviewed chart prior to disease state call. Spoke with patient regarding BP  Recent Office Vitals: BP Readings from Last 3 Encounters:  06/01/21 (!) 106/46  05/14/21 (!) 109/58  01/27/21 (!) 115/50   Pulse Readings from Last 3 Encounters:  06/01/21 77  05/14/21 78  01/27/21 79    Wt Readings from Last 3 Encounters:  06/01/21 91 lb (41.3 kg)  05/14/21 91 lb 3.2 oz (41.4 kg)  01/27/21 93 lb (42.2 kg)     Kidney Function Lab Results  Component Value Date/Time   CREATININE 0.97 05/21/2021 09:30 AM   CREATININE 1.03 (H) 01/20/2021 02:32 PM   CREATININE 0.91 03/22/2014 10:45 PM   GFRNONAA 54 (L) 11/11/2020 02:48 PM   GFRNONAA >60 09/17/2020 05:45 AM   GFRNONAA >60 03/22/2014 10:45 PM   GFRAA 62 11/11/2020 02:48 PM   GFRAA >60 03/22/2014 10:45 PM    BMP Latest Ref Rng & Units 05/21/2021 01/20/2021 11/11/2020  Glucose 65 - 99 mg/dL 79 82 87  BUN 8 - 27 mg/dL 17 25 16   Creatinine 0.57 - 1.00 mg/dL 1.94) 1.74(Y)  BUN/Creat Ratio 12 - 28 18 24 16   Sodium 134 - 144 mmol/L 140 139 134  Potassium 3.5 - 5.2 mmol/L 5.0 4.5 3.9  Chloride 96 - 106 mmol/L 103 95(L) 93(L)  CO2 20 - 29 mmol/L 22 26 26   Calcium 8.7 - 10.3 mg/dL 9.2 8.14(G 9.8    Current antihypertensive regimen:  Diltiazem 120 mg daily Ramipril 2.5 mg daily Spironolactone 25 mg daily Torsemide 40 mg twice daily  How often are you checking your Blood Pressure?  Patient doesn't check BP at home  Current home BP readings: Patient was seen at Pain Management on 08/09 and her blood pressure at that time was 106/46  What recent interventions/DTPs have been made by any provider to improve Blood Pressure control since last CPP Visit: None ID  Any recent hospitalizations or ED visits since last visit with CPP? No  What diet changes have been made to improve Blood Pressure Control?  Patient reports nothing has changed. She still doesn't eat very much. She reports that she hasn't too long been  awake so she was just trying to get her day started.   What exercise is being done to improve your Blood Pressure Control?  Patient isn't able to walk very well so she doesn't do much exercise.  Adherence Review: Is the patient currently on ACE/ARB medication? Yes Does the patient have >5 day gap between last estimated fill dates? No  Patient stated that she did need some refills on some medications but she is unsure at this moment what they are. She stated she would get her medications out when she has a chance and give me a call back to let me know what she needs.   Phone visit for is scheduled for 06/29/2021 @1230   10/09, CPA/CMA Clinical Pharmacist Assistant Phone: [phone]

## 2021-06-15 ENCOUNTER — Ambulatory Visit
Admission: RE | Admit: 2021-06-15 | Discharge: 2021-06-15 | Disposition: A | Discharge status: Home / Self Care | Payer: Medicare Other | Source: Ambulatory Visit | Attending: Pain Medicine | Admitting: Pain Medicine

## 2021-06-15 ENCOUNTER — Other Ambulatory Visit: Payer: Self-pay

## 2021-06-15 DIAGNOSIS — M5136 Other intervertebral disc degeneration, lumbar region: Secondary | ICD-10-CM | POA: Insufficient documentation

## 2021-06-15 DIAGNOSIS — M961 Postlaminectomy syndrome, not elsewhere classified: Secondary | ICD-10-CM | POA: Insufficient documentation

## 2021-06-15 DIAGNOSIS — M5417 Radiculopathy, lumbosacral region: Secondary | ICD-10-CM | POA: Insufficient documentation

## 2021-06-15 DIAGNOSIS — M5442 Lumbago with sciatica, left side: Secondary | ICD-10-CM | POA: Diagnosis not present

## 2021-06-15 DIAGNOSIS — G8929 Other chronic pain: Secondary | ICD-10-CM | POA: Insufficient documentation

## 2021-06-15 DIAGNOSIS — M48062 Spinal stenosis, lumbar region with neurogenic claudication: Secondary | ICD-10-CM | POA: Insufficient documentation

## 2021-06-15 DIAGNOSIS — M79605 Pain in left leg: Secondary | ICD-10-CM | POA: Insufficient documentation

## 2021-06-15 DIAGNOSIS — M79604 Pain in right leg: Secondary | ICD-10-CM | POA: Insufficient documentation

## 2021-06-15 DIAGNOSIS — M5441 Lumbago with sciatica, right side: Secondary | ICD-10-CM | POA: Insufficient documentation

## 2021-06-17 NOTE — Telephone Encounter (Signed)
error 

## 2021-06-18 ENCOUNTER — Other Ambulatory Visit: Payer: Self-pay | Admitting: Family Medicine

## 2021-06-21 NOTE — Progress Notes (Signed)
PROVIDER NOTE: Information contained herein reflects review and annotations entered in association with encounter. Interpretation of such information and data should be left to medically-trained personnel. Information provided to patient can be located elsewhere in the medical record under "Patient Instructions". Document created using STT-dictation technology, any transcriptional errors that may result from process are unintentional.    Patient: Suzanne Avila  Service Category: Procedure  Provider: Oswaldo Done, MD  DOB: 1945/11/18  DOS: 06/22/2021  Location: [facility]  MRN: [id number]  Setting: Ambulatory - outpatient  Referring Provider: Delano Metz, MD  Type: Established Patient  Specialty: Interventional Pain Management  PCP: Erasmo Downer, MD   Primary Reason for Visit: Interventional Pain Management Treatment. CC: Back Pain   Procedure:          Anesthesia, Analgesia, Anxiolysis:  Type: Therapeutic Inter-Laminar Epidural Steroid Injection           Region: Lumbar Level: L2-3 Level. Laterality: Left         Type: Minimal Anxiolysis combined with Local Anesthesia Indication(s): Analgesia and Anxiety Route: Intravenous (IV) IV Access: Secured Sedation: Meaningful verbal contact was maintained at all times during the procedure  Local Anesthetic: Lidocaine 1-2%  Position: Prone with head of the table was raised to facilitate breathing.   Indications: 1. DDD (degenerative disc disease), lumbosacral   2. Lumbosacral radiculopathy at L2 (Left)   3. 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 Chronic lower extremity pain (1ry area of Pain) (Bilateral) (L>R)   5. 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 Impaired ambulation    Pain Score: Pre-procedure: 6 /10 Post-procedure: 0-No pain/10    Pre-op H&P Assessment:  Suzanne Avila is a 75 y.o. (year old), female patient,  seen today for interventional treatment. She  has a past surgical history that includes Back surgery; Eye surgery; and Carpal tunnel release (Left). [name] has a current medication list which includes the following prescription(s): albuterol, albuterol, atorvastatin, breztri aerosphere, diltiazem, dorzolamide, [START ON 07/03/2021] hydrocodone-acetaminophen, latanoprost, magnesium gluconate, meloxicam, mirtazapine, multi-vitamin, omeprazole, pregabalin, ramipril, ropinirole, spironolactone, tizanidine, torsemide, vitamin b-12, aspirin, [START ON 08/02/2021] hydrocodone-acetaminophen, and [START ON 09/01/2021] hydrocodone-acetaminophen. Her primarily concern today is the Back Pain  Initial Vital Signs:  Pulse/HCG Rate: 65ECG Heart Rate: 60 Temp: (!) 97 F (36.1 C) Resp: 18 BP: (!) 122/59 SpO2: 100 %  BMI: Estimated body mass index is 18.38 kg/m as calculated from the following:   Height as of this encounter: 4\' 11"  (1.499 m).   Weight as of this encounter: 91 lb (41.3 kg).  Risk Assessment: Allergies: Reviewed. She is allergic to codeine, cyclobenzaprine, and penicillins.  Allergy Precautions: None required Coagulopathies: Reviewed. None identified.  Blood-thinner therapy: None at this time Active Infection(s): Reviewed. None identified. Suzanne Avila is afebrile  Site Confirmation: Suzanne Avila was asked to confirm the procedure and laterality before marking the site Procedure checklist: Completed Consent: Before the procedure and under the influence of no sedative(s), amnesic(s), or anxiolytics, the patient was informed of the treatment options, risks and possible complications. To fulfill our ethical and legal obligations, as recommended by the American Medical Association's Code of Ethics, I have informed the patient of my clinical impression; the nature and purpose of the treatment or procedure; the risks, benefits, and possible complications of the intervention; the alternatives,  including doing nothing; the risk(s) and benefit(s) of the alternative treatment(s) or procedure(s); and  the risk(s) and benefit(s) of doing nothing. The patient was provided information about the general risks and possible complications associated with the procedure. These may include, but are not limited to: failure to achieve desired goals, infection, bleeding, organ or nerve damage, allergic reactions, paralysis, and death. In addition, the patient was informed of those risks and complications associated to Spine-related procedures, such as failure to decrease pain; infection (i.e.: Meningitis, epidural or intraspinal abscess); bleeding (i.e.: epidural hematoma, subarachnoid hemorrhage, or any other type of intraspinal or peri-dural bleeding); organ or nerve damage (i.e.: Any type of peripheral nerve, nerve root, or spinal cord injury) with subsequent damage to sensory, motor, and/or autonomic systems, resulting in permanent pain, numbness, and/or weakness of one or several areas of the body; allergic reactions; (i.e.: anaphylactic reaction); and/or death. Furthermore, the patient was informed of those risks and complications associated with the medications. These include, but are not limited to: allergic reactions (i.e.: anaphylactic or anaphylactoid reaction(s)); adrenal axis suppression; blood sugar elevation that in diabetics may result in ketoacidosis or comma; water retention that in patients with history of congestive heart failure may result in shortness of breath, pulmonary edema, and decompensation with resultant heart failure; weight gain; swelling or edema; medication-induced neural toxicity; particulate matter embolism and blood vessel occlusion with resultant organ, and/or nervous system infarction; and/or aseptic necrosis of one or more joints. Finally, the patient was informed that Medicine is not an exact science; therefore, there is also the possibility of unforeseen or unpredictable risks  and/or possible complications that may result in a catastrophic outcome. The patient indicated having understood very clearly. We have given the patient no guarantees and we have made no promises. Enough time was given to the patient to ask questions, all of which were answered to the patient's satisfaction. Ms. Storlie has indicated that she wanted to continue with the procedure. Attestation: I, the ordering provider, attest that I have discussed with the patient the benefits, risks, side-effects, alternatives, likelihood of achieving goals, and potential problems during recovery for the procedure that I have provided informed consent. Date  Time: 06/22/2021  8:49 AM  Pre-Procedure Preparation:  Monitoring: As per clinic protocol. Respiration, ETCO2, SpO2, BP, heart rate and rhythm monitor placed and checked for adequate function Safety Precautions: Patient was assessed for positional comfort and pressure points before starting the procedure. Time-out: I initiated and conducted the "Time-out" before starting the procedure, as per protocol. The patient was asked to participate by confirming the accuracy of the "Time Out" information. Verification of the correct person, site, and procedure were performed and confirmed by me, the nursing staff, and the patient. "Time-out" conducted as per Joint Commission's Universal Protocol (UP.01.01.01). Time: 7628  Description of Procedure:          Target Area: The interlaminar space, initially targeting the lower laminar border of the superior vertebral body. Approach: Paramedial approach. Area Prepped: Entire Posterior Lumbar Region DuraPrep (Iodine Povacrylex [0.7% available iodine] and Isopropyl Alcohol, 74% w/w) Safety Precautions: Aspiration looking for blood return was conducted prior to all injections. At no point did we inject any substances, as a needle was being advanced. No attempts were made at seeking any paresthesias. Safe injection practices and  needle disposal techniques used. Medications properly checked for expiration dates. SDV (single dose vial) medications used. Description of the Procedure: Protocol guidelines were followed. The procedure needle was introduced through the skin, ipsilateral to the reported pain, and advanced to the target area. Bone was contacted and the needle  walked caudad, until the lamina was cleared. The epidural space was identified using "loss-of-resistance technique" with 2-3 ml of PF-NaCl (0.9% NSS), in a 5cc LOR glass syringe.  Vitals:   06/22/21 0942 06/22/21 0950 06/22/21 1000 06/22/21 1010  BP: 136/62 (!) 80/60 (!) 100/45 124/62  Pulse:      Resp: Temp:  (!) 97.1 F (36.2 C)  (!) 97.1 F (36.2 C)  TempSrc:      SpO2: 93% 100% 100% 100%  Weight:      Height:        Start Time: 0933 hrs. End Time: 0942 hrs.  Materials:  Needle(s) Type: Epidural needle Gauge: 17G Length: 3.5-in Medication(s): Please see orders for medications and dosing details.  Imaging Guidance (Spinal):          Type of Imaging Technique: Fluoroscopy Guidance (Spinal) Indication(s): Assistance in needle guidance and placement for procedures requiring needle placement in or near specific anatomical locations not easily accessible without such assistance. Exposure Time: Please see nurses notes. Contrast: Before injecting any contrast, we confirmed that the patient did not have an allergy to iodine, shellfish, or radiological contrast. Once satisfactory needle placement was completed at the desired level, radiological contrast was injected. Contrast injected under live fluoroscopy. No contrast complications. See chart for type and volume of contrast used. Fluoroscopic Guidance: I was personally present during the use of fluoroscopy. "Tunnel Vision Technique" used to obtain the best possible view of the target area. Parallax error corrected before commencing the procedure. "Direction-depth-direction" technique used  to introduce the needle under continuous pulsed fluoroscopy. Once target was reached, antero-posterior, oblique, and lateral fluoroscopic projection used confirm needle placement in all planes. Images permanently stored in EMR. Interpretation: I personally interpreted the imaging intraoperatively. Adequate needle placement confirmed in multiple planes. Appropriate spread of contrast into desired area was observed. No evidence of afferent or efferent intravascular uptake. No intrathecal or subarachnoid spread observed. Permanent images saved into the patient's record.  Antibiotic Prophylaxis:   Anti-infectives (From admission, onward)    None      Indication(s): None identified  Post-operative Assessment:  Post-procedure Vital Signs:  Pulse/HCG Rate: 65(!) 58 Temp: (!) 97.1 F (36.2 C) Resp: 16 BP: 124/62 SpO2: 100 %  EBL: None  Complications: No immediate post-treatment complications observed by team, or reported by patient.  Note: The patient tolerated the entire procedure well. A repeat set of vitals were taken after the procedure and the patient was kept under observation following institutional policy, for this type of procedure. Post-procedural neurological assessment was performed, showing return to baseline, prior to discharge. The patient was provided with post-procedure discharge instructions, including a section on how to identify potential problems. Should any problems arise concerning this procedure, the patient was given instructions to immediately contact us, at any time, without hesitation. In any case, we plan to contact the patient by telephone for a follow-up status report regarding this interventional procedure.  Comments:  No additional relevant information.  Plan of Care  Orders:  Orders Placed This Encounter  Procedures   Lumbar Epidural Injection    Scheduling Instructions:     Procedure: Interlaminar LESI L2-3     Laterality: Left-sided     Sedation:  Patient's choice     Timeframe:  Today    Order Specific Question:   Where will this procedure be performed?    Answer:   [facility]   DG [facility] C-ARM 1-60 MIN NO REPORT  Intraoperative interpretation by procedural physician at [facility].    Standing Status:   Standing    Number of Occurrences:   1    Order Specific Question:   Reason for exam:    Answer:   Assistance in needle guidance and placement for procedures requiring needle placement in or near specific anatomical locations not easily accessible without such assistance.   Informed Consent Details: Physician/Practitioner Attestation; Transcribe to consent form and obtain patient signature    Note: Always confirm laterality of pain with Ms. Claiborne Billingsallahan, before procedure. Transcribe to consent form and obtain patient signature.    Order Specific Question:   Physician/Practitioner attestation of informed consent for procedure/surgical case    Answer:   I, the physician/practitioner, attest that I have discussed with the patient the benefits, risks, side effects, alternatives, likelihood of achieving goals and potential problems during recovery for the procedure that I have provided informed consent.    Order Specific Question:   Procedure    Answer:   Lumbar epidural steroid injection under fluoroscopic guidance    Order Specific Question:   Physician/Practitioner performing the procedure    Answer:   Dereonna Lensing A. Laban EmperorNaveira, MD    Order Specific Question:   Indication/Reason    Answer:   Low back and/or lower extremity pain secondary to lumbar radiculitis   Provide equipment / supplies at bedside    "Epidural Tray" (Disposable  single use) Catheter: NOT required    Standing Status:   Standing    Number of Occurrences:   1    Order Specific Question:   Specify    Answer:   Epidural Tray    Chronic Opioid Analgesic:  Hydrocodone/APAP 7.5/325, 1 tab PO q 6 hrs (30 mg/day of hydrocodone) MME/day: 30 mg/day.    Medications ordered for procedure: Meds ordered this encounter  Medications   HYDROcodone-acetaminophen (NORCO) 7.5-325 MG tablet    Sig: Take 1 tablet by mouth every 6 (six) hours as needed for severe pain. Must last 30 days    Dispense:  120 tablet    Refill:  0    Dispense 1 day early if closed on fill date. Warn not to take CNS-depressants 8 hours before or after taking opioid. Do not send refill request. Renewal requires appointment.   iohexol (OMNIPAQUE) 180 MG/ML injection 10 mL    Must be Myelogram-compatible. If not available, you may substitute with a water-soluble, non-ionic, hypoallergenic, myelogram-compatible radiological contrast medium.   lidocaine (XYLOCAINE) 2 % (with pres) injection 400 mg   lactated ringers infusion 1,000 mL   midazolam (VERSED) 5 MG/5ML injection 0.5-2 mg    Make sure Flumazenil is available in the pyxis when using this medication. If oversedation occurs, administer 0.2 mg IV over 15 sec. If after 45 sec no response, administer 0.2 mg again over 1 min; may repeat at 1 min intervals; not to exceed 4 doses (1 mg)   sodium chloride flush (NS) 0.9 % injection 2 mL   ropivacaine (PF) 2 mg/mL (0.2%) (NAROPIN) injection 2 mL   triamcinolone acetonide (KENALOG-40) injection 40 mg    Medications administered: We administered iohexol, lidocaine, lactated ringers, midazolam, sodium chloride flush, ropivacaine (PF) 2 mg/mL (0.2%), and triamcinolone acetonide.  See the medical record for exact dosing, route, and time of administration.  Follow-up plan:   Return in about 2 weeks (around 07/06/2021) for Proc-day(T,Th), (VV), (PPE).        Considering:   Diagnostic bilateral IA hip joint injection  Possible  bilateral hip joint RFA  Possible bilateral lumbar facet RFA  Possible left SI joint RFA  Diagnostic bilateral L4-L5 TFESI  Diagnostic caudal ESI  Diagnostic caudal epidurogram  Possible RACZ epidural lysis of adhesions  Diagnostic left L2-3 LESI   Possible bilateral  Lumbar SCS trial implant    Palliative PRN treatment(s):   Diagnostic right lumbar facet block #2  Diagnostic left lumbar facet block #3  Diagnostic left SI joint block #3           Recent Visits Date Type Provider Dept  06/01/21 Office Visit Delano Metz, MD [facility]  05/04/21 Telemedicine Delano Metz, MD [facility]  Showing recent visits within past 90 days and meeting all other requirements Today's Visits Date Type Provider Dept  06/22/21 Procedure visit Delano Metz, MD [facility]  Showing today's visits and meeting all other requirements Future Appointments Date Type Provider Dept  07/06/21 Appointment Delano Metz, MD [facility]  08/23/21 Appointment Delano Metz, MD [facility]  Showing future appointments within next 90 days and meeting all other requirements Disposition: Discharge home  Discharge (Date  Time): 06/22/2021; 1014 hrs.   Primary Care Physician: Erasmo Downer, MD Location: [facility] Note by: Oswaldo Done, MD Date: 06/22/2021; Time: 12:27 PM  Disclaimer:  Medicine is not an Visual merchandiser. The only guarantee in medicine is that nothing is guaranteed. It is important to note that the decision to proceed with this intervention was based on the information collected from the patient. The Data and conclusions were drawn from the patient's questionnaire, the interview, and the physical examination. Because the information was provided in large part by the patient, it cannot be guaranteed that it has not been purposely or unconsciously manipulated. Every effort has been made to obtain as much relevant data as possible for this evaluation. It is important to note that the conclusions that lead to this procedure are derived in large part from the available data. Always take into account that the treatment will also be  dependent on availability of resources and existing treatment guidelines, considered by other Pain Management Practitioners as being common knowledge and practice, at the time of the intervention. For Medico-Legal purposes, it is also important to point out that variation in procedural techniques and pharmacological choices are the acceptable norm. The indications, contraindications, technique, and results of the above procedure should only be interpreted and judged by a Board-Certified Interventional Pain Specialist with extensive familiarity and expertise in the same exact procedure and technique.

## 2021-06-22 ENCOUNTER — Ambulatory Visit (HOSPITAL_BASED_OUTPATIENT_CLINIC_OR_DEPARTMENT_OTHER): Payer: Medicare Other | Admitting: Pain Medicine

## 2021-06-22 ENCOUNTER — Encounter: Payer: Self-pay | Admitting: Pain Medicine

## 2021-06-22 ENCOUNTER — Other Ambulatory Visit: Payer: Self-pay

## 2021-06-22 ENCOUNTER — Ambulatory Visit
Admission: RE | Admit: 2021-06-22 | Discharge: 2021-06-22 | Disposition: A | Discharge status: Home / Self Care | Payer: Medicare Other | Source: Ambulatory Visit | Attending: Pain Medicine | Admitting: Pain Medicine

## 2021-06-22 VITALS — BP 124/62 | HR 65 | Temp 97.1°F | Resp 16 | Ht 59.0 in | Wt 91.0 lb

## 2021-06-22 DIAGNOSIS — Z5189 Encounter for other specified aftercare: Secondary | ICD-10-CM | POA: Diagnosis present

## 2021-06-22 DIAGNOSIS — G894 Chronic pain syndrome: Secondary | ICD-10-CM | POA: Diagnosis present

## 2021-06-22 DIAGNOSIS — M47816 Spondylosis without myelopathy or radiculopathy, lumbar region: Secondary | ICD-10-CM | POA: Diagnosis present

## 2021-06-22 DIAGNOSIS — M5417 Radiculopathy, lumbosacral region: Secondary | ICD-10-CM | POA: Insufficient documentation

## 2021-06-22 DIAGNOSIS — M5136 Other intervertebral disc degeneration, lumbar region: Secondary | ICD-10-CM | POA: Insufficient documentation

## 2021-06-22 DIAGNOSIS — Z79899 Other long term (current) drug therapy: Secondary | ICD-10-CM | POA: Insufficient documentation

## 2021-06-22 DIAGNOSIS — M79604 Pain in right leg: Secondary | ICD-10-CM | POA: Insufficient documentation

## 2021-06-22 DIAGNOSIS — M48062 Spinal stenosis, lumbar region with neurogenic claudication: Secondary | ICD-10-CM

## 2021-06-22 DIAGNOSIS — M5442 Lumbago with sciatica, left side: Secondary | ICD-10-CM | POA: Insufficient documentation

## 2021-06-22 DIAGNOSIS — Z79891 Long term (current) use of opiate analgesic: Secondary | ICD-10-CM | POA: Insufficient documentation

## 2021-06-22 DIAGNOSIS — G8929 Other chronic pain: Secondary | ICD-10-CM

## 2021-06-22 DIAGNOSIS — M25552 Pain in left hip: Secondary | ICD-10-CM | POA: Insufficient documentation

## 2021-06-22 DIAGNOSIS — M5137 Other intervertebral disc degeneration, lumbosacral region: Secondary | ICD-10-CM

## 2021-06-22 DIAGNOSIS — R262 Difficulty in walking, not elsewhere classified: Secondary | ICD-10-CM | POA: Insufficient documentation

## 2021-06-22 DIAGNOSIS — M51369 Other intervertebral disc degeneration, lumbar region without mention of lumbar back pain or lower extremity pain: Secondary | ICD-10-CM

## 2021-06-22 DIAGNOSIS — M79605 Pain in left leg: Secondary | ICD-10-CM | POA: Insufficient documentation

## 2021-06-22 DIAGNOSIS — M25551 Pain in right hip: Secondary | ICD-10-CM | POA: Insufficient documentation

## 2021-06-22 DIAGNOSIS — M5441 Lumbago with sciatica, right side: Secondary | ICD-10-CM | POA: Insufficient documentation

## 2021-06-22 DIAGNOSIS — M51379 Other intervertebral disc degeneration, lumbosacral region without mention of lumbar back pain or lower extremity pain: Secondary | ICD-10-CM

## 2021-06-22 DIAGNOSIS — M961 Postlaminectomy syndrome, not elsewhere classified: Secondary | ICD-10-CM | POA: Diagnosis present

## 2021-06-22 MED ORDER — SODIUM CHLORIDE (PF) 0.9 % IJ SOLN
INTRAMUSCULAR | Status: AC
  Filled 2021-06-22: qty 10

## 2021-06-22 MED ORDER — TRIAMCINOLONE ACETONIDE 40 MG/ML IJ SUSP
INTRAMUSCULAR | Status: AC
  Filled 2021-06-22: qty 1

## 2021-06-22 MED ORDER — LACTATED RINGERS IV SOLN
1000.0000 mL | Freq: Once | INTRAVENOUS | Status: AC
  Administered 2021-06-22: 1000 mL via INTRAVENOUS

## 2021-06-22 MED ORDER — LIDOCAINE HCL 2 % IJ SOLN
20.0000 mL | Freq: Once | INTRAMUSCULAR | Status: AC
  Administered 2021-06-22: 200 mg

## 2021-06-22 MED ORDER — SODIUM CHLORIDE 0.9% FLUSH
2.0000 mL | Freq: Once | INTRAVENOUS | Status: AC
  Administered 2021-06-22: 2 mL

## 2021-06-22 MED ORDER — ROPIVACAINE HCL 2 MG/ML IJ SOLN
2.0000 mL | Freq: Once | INTRAMUSCULAR | Status: AC
  Administered 2021-06-22: 2 mL via EPIDURAL

## 2021-06-22 MED ORDER — IOHEXOL 180 MG/ML  SOLN
10.0000 mL | Freq: Once | INTRAMUSCULAR | Status: AC
  Administered 2021-06-22: 5 mL via EPIDURAL

## 2021-06-22 MED ORDER — TRIAMCINOLONE ACETONIDE 40 MG/ML IJ SUSP
40.0000 mg | Freq: Once | INTRAMUSCULAR | Status: AC
  Administered 2021-06-22: 40 mg

## 2021-06-22 MED ORDER — MIDAZOLAM HCL 5 MG/5ML IJ SOLN
INTRAMUSCULAR | Status: AC
  Filled 2021-06-22: qty 5

## 2021-06-22 MED ORDER — MIDAZOLAM HCL 5 MG/5ML IJ SOLN
0.5000 mg | Freq: Once | INTRAMUSCULAR | Status: AC
  Administered 2021-06-22: 1.5 mg via INTRAVENOUS

## 2021-06-22 MED ORDER — ROPIVACAINE HCL 2 MG/ML IJ SOLN
INTRAMUSCULAR | Status: AC
  Filled 2021-06-22: qty 20

## 2021-06-22 MED ORDER — HYDROCODONE-ACETAMINOPHEN 7.5-325 MG PO TABS: 1.0000 | ORAL_TABLET | Freq: Four times a day (QID) | ORAL | 0 refills | Status: AC | PRN

## 2021-06-22 MED ORDER — LIDOCAINE HCL (PF) 2 % IJ SOLN
INTRAMUSCULAR | Status: AC
  Filled 2021-06-22: qty 20

## 2021-06-22 NOTE — Addendum Note (Signed)
Addended by: Delano Metz A on: 06/22/2021 12:32 PM   Modules accepted: Orders

## 2021-06-22 NOTE — Progress Notes (Signed)
Safety precautions to be maintained throughout the outpatient stay will include: orient to surroundings, keep bed in low position, maintain call bell within reach at all times, provide assistance with transfer out of bed and ambulation.  

## 2021-06-23 ENCOUNTER — Telehealth: Payer: Self-pay

## 2021-06-23 NOTE — Telephone Encounter (Signed)
Post procedure phone call.  LM 

## 2021-06-25 ENCOUNTER — Telehealth: Payer: Self-pay

## 2021-06-25 NOTE — Progress Notes (Addendum)
APPOINTMENT REMINDER  Suzanne Avila was reminded to have all medications, supplements and any blood pressure readings available for review with Suzanne Avila, Pharm. D, at her telephone visit on 06/29/2021 at 1:30 pm.  Questions: Have you had any recent office visit or specialist visit outside of [facility]? NO Are there any concerns you would like to discuss during your office visit? No   Patient Verbalized understanding and denies any issues/side effects from any of her current medications  Everlean Cherry Clinical Pharmacist Assistant [phone]

## 2021-06-29 ENCOUNTER — Telehealth: Payer: Self-pay

## 2021-06-29 NOTE — Progress Notes (Deleted)
Chronic Care Management Pharmacy Note  06/29/2021 Name:  Suzanne Avila MRN:  [id number] DOB:  1946-04-15  Subjective: Suzanne Avila is an 75 y.o. year old female who is a primary patient of Bacigalupo, Dionne Bucy, MD.  The CCM team was consulted for assistance with disease management and care coordination needs.    Engaged with patient by telephone for follow up visit in response to provider referral for pharmacy case management and/or care coordination services.   Consent to Services:  The patient was given information about Chronic Care Management services, agreed to services, and gave verbal consent prior to initiation of services.  Please see initial visit note for detailed documentation.   Patient Care Team: Virginia Crews, MD as PCP - General (Family Medicine) Lucky Cowboy, Erskine Squibb, MD as Referring Physician (Vascular Surgery) Milinda Pointer, MD as Referring Physician (Pain Medicine) Leandrew Koyanagi, MD as Referring Physician (Ophthalmology) Telford Nab, RN as Oncology Nurse Navigator Tyler Pita, MD as Consulting Physician (Pulmonary Disease) Germaine Pomfret, [facility] (Pharmacist)  Recent office visits: 05/14/21: Patient presented to Dr. Brita Romp for follow-up. Hypotension due to hypovolemia. Resumed alcohol use.  01/20/2021 Fenton Malling PA AMB Referral to [facility] Coordinaton 12/17/2020 Fenton Malling PA Increase torsemide to 65m BID,Noted cellulitis of the right lower leg secondary to swelling. Started Keflex 500 mg   Recent consult visits: 06/22/2021  Dr.Naveira  (Pain Medicine) for epidural injection 06/01/2021  Dr.Naveira  (Pain Medicine)  01/27/2021  Dr.Naveira  (Pain Medicine)   Hospital visits: None in past 6 months   Objective:  Lab Results  Component Value Date   CREATININE 0.97 05/21/2021   BUN 17 05/21/2021   GFRNONAA 54 (L) 11/11/2020   GFRAA 62 11/11/2020   NA 140 05/21/2021   K 5.0 05/21/2021   CALCIUM 9.2 05/21/2021    CO2 22 05/21/2021   GLUCOSE 79 05/21/2021    No results found for: HGBA1C, FRUCTOSAMINE, GFR, MICROALBUR  Last diabetic Eye exam: No results found for: HMDIABEYEEXA  Last diabetic Foot exam: No results found for: HMDIABFOOTEX   Lab Results  Component Value Date   CHOL 142 05/21/2021   HDL 57 05/21/2021   LDLCALC 68 05/21/2021   TRIG 92 05/21/2021   CHOLHDL 2.5 05/21/2021    Hepatic Function Latest Ref Rng & Units 05/21/2021 01/20/2021 11/11/2020  Total Protein 6.0 - 8.5 g/dL 6.3 7.1 6.9  Albumin 3.7 - 4.7 g/dL 4.1 4.1 4.3  AST 0 - 40 IU/L _0 ALT 0 - 32 IU/L _1 Alk Phosphatase 44 - 121 IU/L 130(H) 217(H) 125(H)  Total Bilirubin 0.0 - 1.2 mg/dL 0.4 0.3 0.3    Lab Results  Component Value Date/Time   TSH 1.110 06/14/2016 09:13 AM    CBC Latest Ref Rng & Units 01/20/2021 09/15/2020 09/14/2020  WBC 3.4 - 10.8 x10E3/uL 10.6 12.5(H) 12.4(H)  Hemoglobin 11.1 - 15.9 g/dL 13.5 9.9(L) 12.1  Hematocrit 34.0 - 46.6 % 39.9 27.5(L) 33.9(L)  Platelets 150 - 450 x10E3/uL 397 321 378    Lab Results  Component Value Date/Time   VD25OH 45.9 02/03/2020 11:12 AM   VD25OH 31.8 01/01/2018 10:46 AM    Clinical ASCVD: No  The 10-year ASCVD risk score (Mikey BussingDC Jr., et al., 2013) is: 29.6%   Values used to calculate the score:     Age: [age]years     Sex: Female     Is Non-Hispanic African American: No     Diabetic: No  Tobacco smoker: Yes     Systolic Blood Pressure: 500 mmHg     Is BP treated: Yes     HDL Cholesterol: 57 mg/dL     Total Cholesterol: 142 mg/dL    Depression screen [facility] 2/9 06/22/2021 11/11/2020 08/07/2020  Decreased Interest 0 0 0  Down, Depressed, Hopeless 0 0 1  PHQ - 2 Score 0 0 1  Altered sleeping - 0 1  Tired, decreased energy - 0 2  Change in appetite - 0 3  Feeling bad or failure about yourself  - 0 0  Trouble concentrating - 0 2  Moving slowly or fidgety/restless - 0 0  Suicidal thoughts - 0 0  PHQ-9 Score - 0 9  Difficult doing work/chores  - Not difficult at all Somewhat difficult    Social History   Tobacco Use  Smoking Status Every Day   Packs/day: 1.50   Years: 50.00   Pack years: 75.00   Types: Cigarettes  Smokeless Tobacco Never  Tobacco Comments   1PPD 11/18/2020   BP Readings from Last 3 Encounters:  06/22/21 124/62  06/01/21 (!) 106/46  05/14/21 (!) 109/58   Pulse Readings from Last 3 Encounters:  06/22/21 65  06/01/21 77  05/14/21 78   Wt Readings from Last 3 Encounters:  06/22/21 91 lb (41.3 kg)  06/01/21 91 lb (41.3 kg)  05/14/21 91 lb 3.2 oz (41.4 kg)   BMI Readings from Last 3 Encounters:  06/22/21 18.38 kg/m  06/01/21 18.38 kg/m  05/14/21 18.42 kg/m    Assessment/Interventions: Review of patient past medical history, allergies, medications, health status, including review of consultants reports, laboratory and other test data, was performed as part of comprehensive evaluation and provision of chronic care management services.   SDOH:  (Social Determinants of Health) assessments and interventions performed: Yes   SDOH Screenings   Alcohol Screen: Low Risk    Last Alcohol Screening Score (AUDIT): 0  Depression (PHQ2-9): Low Risk    PHQ-2 Score: 0  Financial Resource Strain: Low Risk    Difficulty of Paying Living Expenses: Not hard at all  Food Insecurity: Not on file  Housing: Not on file  Physical Activity: Not on file  Social Connections: Not on file  Stress: Not on file  Tobacco Use: High Risk   Smoking Tobacco Use: Every Day   Smokeless Tobacco Use: Never  Transportation Needs: Not on file    CCM Care Plan  Allergies  Allergen Reactions   Codeine Itching   Cyclobenzaprine Itching   Penicillins     Itching without rash per patient.  Unable to say when it happened, other than "a long time ago"    Medications Reviewed Today     Reviewed by Milinda Pointer, MD (Physician) on 06/22/21 at 1231  Med List Status: <None>   Medication Order Taking? Sig Documenting  Provider Last Dose Status Informant  albuterol (PROVENTIL) (2.5 MG/3ML) 0.083% nebulizer solution [id number] Yes Take 3 mLs (2.5 mg total) by nebulization every 6 (six) hours as needed for wheezing or shortness of breath. Fenton Malling M, PA-C Taking Active   albuterol (VENTOLIN HFA) 108 (90 Base) MCG/ACT inhaler [id number] Yes Inhale 2 puffs into the lungs every 6 (six) hours as needed for wheezing or shortness of breath. Tyler Pita, MD Taking Active   atorvastatin (LIPITOR) 40 MG tablet [id number] Yes Take 1 tablet (40 mg total) by mouth daily. Birdie Sons, MD Taking Active   Budeson-Glycopyrrol-Formoterol [facility] AEROSPHERE) 160-9-4.8 MCG/ACT Hollie Salk [id number]  Yes Inhale 2 puffs into the lungs in the morning and at bedtime. Tyler Pita, MD Taking Active   diltiazem (CARDIZEM) 120 MG tablet [id number] Yes Take 1 tablet (120 mg total) by mouth daily. Birdie Sons, MD Taking Active   dorzolamide (TRUSOPT) 2 % ophthalmic solution [id number] Yes Place 1 drop into both eyes 2 (two) times daily. Jerrol Banana., MD Taking Active   HYDROcodone-acetaminophen [facility]) 7.5-325 MG tablet [id number] Yes Take 1 tablet by mouth every 6 (six) hours as needed for severe pain. Must last 30 days Milinda Pointer, MD Taking Active   HYDROcodone-acetaminophen [facility]) 7.5-325 MG tablet [id number] Yes Take 1 tablet by mouth every 6 (six) hours as needed for severe pain. Must last 30 days Milinda Pointer, MD Taking Active            Med Note Dossie Arbour, Tucker A   Tue Jun 01, 2021  2:44 PM) WARNING: Not a Duplicate. Future prescription. Do NOT Delete!! [facility] Chronic Pain Management Patient   HYDROcodone-acetaminophen (NORCO) 7.5-325 MG tablet [id number] Yes Take 1 tablet by mouth every 6 (six) hours as needed for severe pain. Must last 30 days Milinda Pointer, MD Taking Active            Med Note Dossie Arbour, Roxton A   Tue Jun 22, 2021 12:31 PM) WARNING: Not a Duplicate. Future  prescription. DO NOT DELETE during hospital medication reconciliation or at discharge. [facility] Chronic Pain Management Patient   latanoprost (XALATAN) 0.005 % ophthalmic solution [id number] Yes Place 1 drop into both eyes at bedtime. Jerrol Banana., MD Taking Active   magnesium gluconate (MAGONATE) 500 MG tablet [id number] Yes Take 500 mg by mouth daily. [provider] Taking Active Self  meloxicam (MOBIC) 15 MG tablet [id number] Yes Take 1 tablet (15 mg total) by mouth daily. Virginia Crews, MD Taking Active   mirtazapine (REMERON) 7.5 MG tablet [id number] Yes Take 1 tablet (7.5 mg total) by mouth at bedtime. Birdie Sons, MD Taking Active   Multiple Vitamin (MULTI-VITAMIN) tablet [id number] Yes Take 1 tablet by mouth daily.  [provider] Taking Active Self  omeprazole (PRILOSEC) 40 MG capsule [id number] Yes Take 1 capsule (40 mg total) by mouth daily. Virginia Crews, MD Taking Active   pregabalin (LYRICA) 150 MG capsule [id number] Yes Take 1 capsule (150 mg total) by mouth every 8 (eight) hours. Birdie Sons, MD Taking Active   ramipril (ALTACE) 2.5 MG capsule [id number] Yes Take 1 capsule (2.5 mg total) by mouth daily. Fenton Malling M, PA-C Taking Active   rOPINIRole (REQUIP) 2 MG tablet [id number] Yes Take 1 tablet (2 mg total) by mouth 2 (two) times daily.  Patient taking differently: Take 2 mg by mouth at bedtime.   Mar Daring, PA-C Taking Active   spironolactone (ALDACTONE) 25 MG tablet [id number] Yes Take 1 tablet (25 mg total) by mouth daily. Birdie Sons, MD Taking Active   tizanidine (ZANAFLEX) 2 MG capsule [id number] Yes Take 1 capsule (2 mg total) by mouth 3 (three) times daily as needed for muscle spasms. Virginia Crews, MD Taking Active   torsemide (DEMADEX) 20 MG tablet [id number] Yes TAKE 2 TABLETS TWICE A DAY Bacigalupo, Dionne Bucy, MD Taking Active   vitamin B-12 (CYANOCOBALAMIN) 500 MCG tablet [id number] Yes Take 1 tablet  (500 mcg total) by mouth daily. 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   Avascular necrosis of femoral head (Left) ([place]) 11/02/2020   Cavitary lesion of lung 09/15/2020   AF (paroxysmal atrial fibrillation) ([place]) 09/15/2020   Failure to thrive in adult 09/15/2020   Severe protein-calorie malnutrition Altamease [place]: less than 60% of standard weight) ([place]) 09/15/2020   Dysphagia 09/15/2020   Hypotension 09/15/2020   Abscess of right lung with pneumonia ([place]) 09/14/2020   Chronic low back pain (Left) w/o sciatica 09/10/2020   Pharmacologic therapy 05/05/2020   Chronic diarrhea 05/04/2020   DDD (degenerative disc disease), lumbosacral 11/19/2019   Therapeutic opioid-induced constipation (OIC) 08/01/2019   Chronic sacroiliac joint pain (Left) 05/01/2019   Chronic groin pain (Left) 05/01/2019   Spondylosis without myelopathy or radiculopathy, lumbosacral region 05/01/2019   Other specified dorsopathies, sacral and sacrococcygeal region 05/01/2019   Renal artery stenosis (HCC) 12/27/2017   Pain in limb 12/22/2017   Tobacco use disorder 12/22/2017   Renal atrophy, right 12/22/2017   Thoracic radiculitis 01/31/2017   Chronic lower extremity pain (1ry area of Pain) (Bilateral) (L>R) 04/25/2016   Chronic upper extremity pain (3ry area of Pain) (Bilateral) (L>R) 04/25/2016   Disturbance of skin sensation 04/20/2016   Neurogenic pain 04/20/2016   Vitamin D deficiency 11/11/2015   Edema  09/02/2015   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   Long term current use of opiate analgesic 08/14/2015   Long term prescription opiate use 59/56/3875   Uncomplicated opioid dependence ([place]) 08/14/2015   Opiate use (30 MME/Day) 08/14/2015   Chronic low back pain (2ry area of Pain) (Bilateral) (L>R) w/ sciatica (Bilateral) 08/14/2015   Chronic pain syndrome 64/33/2951   Alcoholic ([place]) 88/41/6606   B12 deficiency 08/14/2015   Narrowing of intervertebral disc space 08/14/2015   History of metabolic disorder 30/16/0109   Fungal infection of toenail 08/14/2015   Osteopenia 08/14/2015   Compulsive tobacco user syndrome 08/14/2015   Osteoarthritis of hip (Left) 08/14/2015   Chronic hip pain (Left) 08/14/2015   Chronic lumbar radicular pain (L5 dermatome) (Bilateral) (L>R) 08/14/2015   Lumbar facet syndrome (Bilateral) (L>R) 08/14/2015   Lumbar spondylosis 08/14/2015   Lumbar Postlaminectomy syndrome (L4-5) 08/14/2015   Fibromyalgia 08/14/2015   Chronic airway obstruction ([place]) 04/21/2015   Allergic rhinitis 02/10/2010   Atrial paroxysmal tachycardia ([place]) 07/06/2009   CAFL (chronic airflow limitation) ([place]) 07/06/2009   Acid reflux 07/06/2009   Glaucoma 07/06/2009   Essential hypertension 07/06/2009   Hypercholesterolemia without hypertriglyceridemia 07/06/2009   Absence of bladder continence 07/06/2009    Immunization History  Administered Date(s) Administered   Fluad Quad(high Dose 65+) 08/02/2019, 08/07/2020   Influenza Split 07/14/2010, 08/16/2011   Influenza, High Dose Seasonal PF 09/02/2017, 09/27/2018   Influenza,inj,Quad PF,6+ Mos 07/09/2014   Moderna Sars-Covid-2 Vaccination 01/02/2020, 01/30/2020, 11/03/2020   Pneumococcal Conjugate-13 01/09/2014   Pneumococcal Polysaccharide-23 12/01/2009, 07/24/2016   Zoster Recombinat (Shingrix) 09/02/2017, 01/09/2019, 08/02/2019   Zoster, Live 05/23/2014    Conditions to be addressed/monitored:  Hypertension,  Hyperlipidemia, Atrial Fibrillation, GERD, COPD, Osteopenia, Tobacco use and Chronic Pain  There are no care plans that you recently modified to display for this patient.    Medication  Assistance: None required.  Patient affirms current coverage meets needs.  Patient's preferred pharmacy is:  [place], [place] [address] Phone: [phone] Fax: [phone]  [facility] [place], [place] [address] [address] Phone: [phone] Fax: [phone]  Uses pill box? Yes Pt endorses 100% compliance  We discussed: Current pharmacy is preferred with insurance plan and patient is satisfied with pharmacy services Patient decided to: Continue current medication management strategy  Care Plan and Follow Up Patient Decision:  Patient agrees to Care Plan and Follow-up.  Plan: Telephone follow up appointment with care management team member scheduled for:  05/07/2021 at 11:00 AM  Junius Argyle, PharmD, CPP Clinical Pharmacist [facility] [phone]  Current Barriers:  Unable to achieve control of blood pressure   Pharmacist Clinical Goal(s):  Patient will achieve control of blood pressure as evidenced by BP less than 140/90  through collaboration with PharmD and provider.   Interventions: 1:1 collaboration with Virginia Crews, MD regarding development and update of comprehensive plan of care as evidenced by provider attestation and co-signature Inter-disciplinary care team collaboration (see longitudinal plan of care) Comprehensive medication review performed; medication list updated in electronic medical record  Hypertension (BP goal {CHL HP UPSTREAM Pharmacist BP ranges:[phone]}) -{US controlled/uncontrolled:25276} -Current treatment: Diltiazem 120 mg daily  Ramipril 2.5 mg daily  Spironolactone 25 mg daily  Torsemide 20 mg  2 tablets twice daily  -Medications previously tried: ***  -Current home readings: *** -Current dietary habits: *** -Current exercise habits: *** -{ACTIONS;DENIES/REPORTS:21021675::"Denies"} hypotensive/hypertensive symptoms -Educated on {CCM BP Counseling:25124} -Counseled to monitor BP at home ***, document, and provide log at future appointments -{CCMPHARMDINTERVENTION:25122}  Atrial Fibrillation (Goal: prevent stroke and major bleeding) -{US controlled/uncontrolled:25276} -CHADSVASC: *** -Current treatment: Rate control: Diltiazem 120 mg daily  Anticoagulation: *** -Medications previously tried: *** -Home BP and HR readings: ***  -Counseled on {CCMAFIBCOUNSELING:25120} -{CCMPHARMDINTERVENTION:25122}   Hyperlipidemia: (LDL goal < ***) -{US controlled/uncontrolled:25276} -Current treatment: Atorvastatin 40 mg daily  -Medications previously tried: ***  -Current dietary patterns: *** -Current exercise habits: *** -Educated on {CCM HLD Counseling:25126} -{CCMPHARMDINTERVENTION:25122}  Tobacco use (Goal ***) -{US controlled/uncontrolled:25276} -Previous quit attempts: *** -Current treatment  *** -Patient smokes {Time to first cigarette:23873} -Patient triggers include: {Smoking Triggers:23882} -On a scale of 1-10, reports MOTIVATION to quit is *** -On a scale of 1-10, reports CONFIDENCE in quitting is *** -{Smoking Cessation Counseling:23883} -{CCMPHARMDINTERVENTION:25122}  COPD (Goal: control symptoms and prevent exacerbations) -{US controlled/uncontrolled:25276} -Current treatment  Albuterol Nebulizer  Ventolin HFA 2 puffs every 6 hours as needed Breztri 2 puffs twice dailu -Medications previously tried: ***  -Gold Grade: {CHL HP Upstream Pharm COPD Gold OQHUT:[id number]} -Current COPD Classification:  {CHL HP Upstream Pharm COPD Classification:3520663854} -MMRC/CAT score: *** -Pulmonary function testing: FEV1 66%, FEV1/FVC 75% (Feb 2022) -Exacerbations  requiring treatment in last 6 months: *** -Patient {Actions; denies-reports:120008} consistent use of maintenance inhaler -Frequency of rescue inhaler use: *** -Counseled on {CCMINHALERCOUNSELING:25121} -{CCMPHARMDINTERVENTION:25122}   Patient Goals/Self-Care Activities Patient will:  - {pharmacypatientgoals:24919}  Follow Up Plan: {CM FOLLOW UP FKCL:27517}

## 2021-07-01 ENCOUNTER — Other Ambulatory Visit (INDEPENDENT_AMBULATORY_CARE_PROVIDER_SITE_OTHER): Payer: Self-pay | Admitting: Nurse Practitioner

## 2021-07-01 DIAGNOSIS — I701 Atherosclerosis of renal artery: Secondary | ICD-10-CM

## 2021-07-05 ENCOUNTER — Ambulatory Visit (INDEPENDENT_AMBULATORY_CARE_PROVIDER_SITE_OTHER): Payer: Medicare Other | Admitting: Nurse Practitioner

## 2021-07-05 ENCOUNTER — Other Ambulatory Visit: Payer: Self-pay

## 2021-07-05 ENCOUNTER — Ambulatory Visit (INDEPENDENT_AMBULATORY_CARE_PROVIDER_SITE_OTHER): Payer: Medicare Other

## 2021-07-05 VITALS — BP 195/93 | HR 71 | Ht <= 58 in | Wt 79.0 lb

## 2021-07-05 DIAGNOSIS — I701 Atherosclerosis of renal artery: Secondary | ICD-10-CM

## 2021-07-05 DIAGNOSIS — F17200 Nicotine dependence, unspecified, uncomplicated: Secondary | ICD-10-CM

## 2021-07-05 DIAGNOSIS — E7800 Pure hypercholesterolemia, unspecified: Secondary | ICD-10-CM

## 2021-07-05 DIAGNOSIS — I10 Essential (primary) hypertension: Secondary | ICD-10-CM | POA: Diagnosis not present

## 2021-07-05 NOTE — Progress Notes (Signed)
Patient: Suzanne Avila  Service Category: E/M  Provider: Gaspar Cola, MD  DOB: January 11, 1946  DOS: 07/06/2021  Location: Office  MRN: [id number]  Setting: Ambulatory outpatient  Referring Provider: Virginia Crews, MD  Type: Established Patient  Specialty: Interventional Pain Management  PCP: Virginia Crews, MD  Location: Remote location  Delivery: TeleHealth     Virtual Encounter - Pain Management PROVIDER NOTE: Information contained herein reflects review and annotations entered in association with encounter. Interpretation of such information and data should be left to medically-trained personnel. Information provided to patient can be located elsewhere in the medical record under "Patient Instructions". Document created using STT-dictation technology, any transcriptional errors that may result from process are unintentional.    Contact & Pharmacy Preferred: [phone] Home: [phone] (home) Mobile: There is no such number on file (mobile). E-mail: No e-mail address on record  [facility] [place] [address] [place] [address] Phone: [phone] Fax: [phone]  [facility] [place] [address] [place] [address] Phone: [phone] Fax: [phone]   Pre-screening  Suzanne Avila offered "in-person" vs "virtual" encounter. She indicated preferring virtual for this encounter.   Reason COVID-19*  Social distancing based on CDC and AMA recommendations.   I contacted Suzanne Avila on 07/06/2021 via telephone.      I clearly identified myself as Gaspar Cola, MD. I verified that I was speaking with the correct person using two identifiers (Name: BENAY POMEROY, and date of birth: 1946/06/11).  Consent I sought verbal advanced consent from Suzanne Avila for virtual visit interactions. I informed Suzanne Avila of possible security and privacy  concerns, risks, and limitations associated with providing "not-in-person" medical evaluation and management services. I also informed Suzanne Avila of the availability of "in-person" appointments. Finally, I informed her that there would be a charge for the virtual visit and that she could be  personally, fully or partially, financially responsible for it. Suzanne Avila expressed understanding and agreed to proceed.   Historic Elements   Suzanne Avila is a 75 y.o. year old, female patient evaluated today after our last contact on 06/22/2021. Suzanne Avila  has a past medical history of Closed left arm fracture, COPD (chronic obstructive pulmonary disease) ([place]), Dehydration (09/15/2020), Glaucoma, Hyperlipidemia, Hypertension, and Lung mass. She also  has a past surgical history that includes Back surgery; Eye surgery; and Carpal tunnel release (Left). Suzanne Avila has a current medication list which includes the following prescription(s): albuterol, albuterol, atorvastatin, breztri aerosphere, diltiazem, dorzolamide, hydrocodone-acetaminophen, [START ON 08/02/2021] hydrocodone-acetaminophen, [START ON 09/01/2021] hydrocodone-acetaminophen, latanoprost, magnesium gluconate, meloxicam, mirtazapine, multi-vitamin, omeprazole, pregabalin, ramipril, ropinirole, spironolactone, tizanidine, torsemide, and vitamin b-12. She  reports that she has been smoking cigarettes. She has a 75.00 pack-year smoking history. She has never used smokeless tobacco. She reports current alcohol use of about 35.0 standard drinks per week. She reports that she does not use drugs. Suzanne Avila is allergic to codeine, cyclobenzaprine, and penicillins.   HPI  Today, she is being contacted for a post-procedure assessment.  The patient indicates having attained an ongoing 50% relief of her low back pain with some remaining pain in the left lower back that seems to be present towards the end of the day.  She indicates currently  experiencing no lower extremity pain.  This would suggest that and in fact she did attain 100% relief of the lumbar  radicular symptoms.  I asked her if she needed anything else done and she indicated that she is doing well and for now does not need any further interventions.  I told the patient to let us know when the pain returns so that we can look at the possibility of repeating some of these injections.  She understood and agreed.  Post-Procedure Evaluation  Procedure (06/22/2021):  Procedure:           Anesthesia, Analgesia, Anxiolysis:  Type: Therapeutic Inter-Laminar Epidural Steroid Injection           Region: Lumbar Level: L2-3 Level. Laterality: Left          Type: Minimal Anxiolysis combined with Local Anesthesia Indication(s): Analgesia and Anxiety Route: Intravenous (IV) IV Access: Secured Sedation: Meaningful verbal contact was maintained at all times during the procedure  Local Anesthetic: Lidocaine 1-2%   Position: Prone with head of the table was raised to facilitate breathing.    Indications: 1. DDD (degenerative disc disease), lumbosacral   2. Lumbosacral radiculopathy at L2 (Left)   3. 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 Chronic lower extremity pain (1ry area of Pain) (Bilateral) (L>R)   5. 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 Impaired ambulation     Pain Score: Pre-procedure: 6 /10 Post-procedure: 0-No pain/10   Anxiolysis: Minimal anxiolysis  Effectiveness during initial hour after procedure (Ultra-Short Term Relief): 50 %.  Local anesthetic used: Long-acting (4-6 hours) Effectiveness: Defined as any analgesic benefit obtained secondary to the administration of local anesthetics. This carries significant diagnostic value as to the etiological location, or anatomical origin, of the pain. Duration of benefit is expected to coincide with the duration of the local anesthetic  used.  Effectiveness during initial 4-6 hours after procedure (Short-Term Relief): 50 %.  Long-term benefit: Defined as any relief past the pharmacologic duration of the local anesthetics.  Effectiveness past the initial 6 hours after procedure (Long-Term Relief): 50 %.  Benefits, current: Defined as benefit present at the time of this evaluation.   Analgesia: The patient indicates having attained an ongoing 50% relief of the pain.  However, there was a discrepancy between the reported analgesia on the day of the procedure and what she is describing today as having occurred after the procedure. Function: Somewhat improved ROM: Somewhat improved   Pharmacotherapy Assessment   Analgesic: Hydrocodone/APAP 7.5/325, 1 tab PO q 6 hrs (30 mg/day of hydrocodone) MME/day: 30 mg/day.   Monitoring: [place] PMP: PDMP reviewed during this encounter.       Pharmacotherapy: No side-effects or adverse reactions reported. Compliance: No problems identified. Effectiveness: Clinically acceptable. Plan: Refer to "POC". UDS:  Summary  Date Value Ref Range Status  06/02/2021 Note  Final    Comment:    ==================================================================== [employer] 13 (MW) ==================================================================== Test                             Result       Flag       Units  Drug Present and Declared for Prescription Verification   Hydrocodone                    >8475        EXPECTED   ng/mg creat   Hydromorphone  1507         EXPECTED   ng/mg creat   Dihydrocodeine                 233          EXPECTED   ng/mg creat   Norhydrocodone                 1773         EXPECTED   ng/mg creat    Sources of hydrocodone include scheduled prescription medications.    Hydromorphone, dihydrocodeine and norhydrocodone are expected    metabolites of hydrocodone. Hydromorphone and dihydrocodeine are    also available as scheduled prescription  medications.  ==================================================================== Test                      Result    Flag   Units      Ref Range   Creatinine              118              mg/dL      >=20 ==================================================================== Declared Medications:  The flagging and interpretation on this report are based on the  following declared medications.  Unexpected results may arise from  inaccuracies in the declared medications.   **Note: The testing scope of this panel includes these medications:   Hydrocodone (Norco)   **Note: The testing scope of this panel does not include the  following reported medications:   Acetaminophen (Norco)  Albuterol (Proventil HFA)  Aspirin  Atorvastatin (Lipitor)  Budesonide  Cyanocobalamin  Diltiazem (Cardizem)  Eye Drop  Formoterol  Glycopyrrolate  Magnesium (Magonate)  Meloxicam (Mobic)  Mirtazapine (Remeron)  Multivitamin  Omeprazole (Prilosec)  Pregabalin (Lyrica)  Ramipril (Altace)  Ropinirole (Requip)  Spironolactone (Aldactone)  Tizanidine (Zanaflex)  Torsemide (Demadex) ==================================================================== For clinical consultation, please call [phone]. ====================================================================      Laboratory Chemistry Profile   Renal Lab Results  Component Value Date   BUN 17 05/21/2021   CREATININE 0.97 05/21/2021   BCR 18 05/21/2021   GFRAA 62 11/11/2020   GFRNONAA 54 (L) 11/11/2020    Hepatic Lab Results  Component Value Date   AST 15 05/21/2021   ALT 11 05/21/2021   ALBUMIN 4.1 05/21/2021   ALKPHOS 130 (H) 05/21/2021    Electrolytes Lab Results  Component Value Date   NA 140 05/21/2021   K 5.0 05/21/2021   CL 103 05/21/2021   CALCIUM 9.2 05/21/2021   MG 1.6 (L) 09/17/2020   PHOS 2.5 09/17/2020    Bone Lab Results  Component Value Date   VD25OH 45.9 02/03/2020    Inflammation (CRP: Acute  Phase) (ESR: Chronic Phase) Lab Results  Component Value Date   CRP <0.8 02/14/2017   ESRSEDRATE 20 02/14/2017   LATICACIDVEN 1.1 09/15/2020         Note: Above Lab results reviewed.  Imaging  VAS US RENAL ARTERY DUPLEX ABDOMINAL VISCERAL  Patient Name:  ARRYN TERRONES  Date of Exam:   07/05/2021 Medical Rec #: [id number]         Accession #:    [id number] Date of Birth: September 23, 1946         Patient Gender: F Patient Age:   75 years Exam Location:  [facility] Procedure:      VAS US RENAL ARTERY DUPLEX Referring Phys: [id number] Kris Hartmann  --------------------------------------------------------------------------------  Indications: RAS  Comparison Study: 07/03/2020; In  comparison to prior study the Both Kidney's                   appear to be decreased in size.  Performing Technologist: Almira Coaster RVS    Examination Guidelines: A complete evaluation includes B-mode imaging, spectral Doppler, color Doppler, and power Doppler as needed of all accessible portions of each vessel. Bilateral testing is considered an integral part of a complete examination. Limited examinations for reoccurring indications may be performed as noted.    Duplex Findings: +------------+--------+--------+------+--------+ Mesenteric  PSV cm/sEDV cm/sPlaqueComments +------------+--------+--------+------+--------+ Aorta Prox     56      14                  +------------+--------+--------+------+--------+ Aorta Mid      71      17                  +------------+--------+--------+------+--------+ Aorta Distal   72      16                  +------------+--------+--------+------+--------+          +------------------+--------+--------+-------+ Right Renal ArteryPSV cm/sEDV cm/sComment +------------------+--------+--------+-------+ Origin               31      9            +------------------+--------+--------+-------+ Proximal              30      7            +------------------+--------+--------+-------+ Mid                  26      6            +------------------+--------+--------+-------+ Distal               33      7            +------------------+--------+--------+-------+  +-----------------+--------+--------+-------+ Left Renal ArteryPSV cm/sEDV cm/sComment +-----------------+--------+--------+-------+ Origin             118      20           +-----------------+--------+--------+-------+ Proximal           124      21           +-----------------+--------+--------+-------+ Mid                 79      17           +-----------------+--------+--------+-------+ Distal              56      14           +-----------------+--------+--------+-------+  +------------+--------+--------+--+-----------+--------+--------+---+ Right KidneyPSV cm/sEDV cm/sRILeft KidneyPSV cm/sEDV cm/sRI  +------------+--------+--------+--+-----------+--------+--------+---+ Upper Pole                    Upper Pole                     +------------+--------+--------+--+-----------+--------+--------+---+ Mid                           Mid                            +------------+--------+--------+--+-----------+--------+--------+---+ Lower Pole  Lower Pole                     +------------+--------+--------+--+-----------+--------+--------+---+ Hilar                         Hilar                          +------------+--------+--------+--+-----------+--------+--------+---+  +------------------+----+------------------+----+ Right Kidney          Left Kidney            +------------------+----+------------------+----+ RAR                   RAR                    +------------------+----+------------------+----+ RAR (manual)          RAR (manual)           +------------------+----+------------------+----+ Cortex                 Cortex                 +------------------+----+------------------+----+ Cortex thickness      Corex thickness        +------------------+----+------------------+----+ Kidney length (cm)5.90Kidney length (cm)9.86 +------------------+----+------------------+----+    Summary: Largest Aortic Diameter: 2.0 cm   Renal:   Right: Abnormal size for the right kidney. 1-59% stenosis of the        right renal artery. RRV flow present. Left:  Normal size of left kidney. 1-59% stenosis of the left renal        artery. LRV flow present.   *See table(s) above for measurements and observations.   Diagnosing physician: Leotis Pain MD   Electronically signed by Leotis Pain MD on 07/06/2021 at 12:41:51 PM.       Final    Assessment  The primary encounter diagnosis was Chronic pain syndrome. 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  Plan of Care  Problem-specific:  No problem-specific Assessment & Plan notes found for this encounter.  Suzanne Avila has a current medication list which includes the following long-term medication(s): albuterol, albuterol, atorvastatin, diltiazem, hydrocodone-acetaminophen, [START ON 08/02/2021] hydrocodone-acetaminophen, [START ON 09/01/2021] hydrocodone-acetaminophen, mirtazapine, omeprazole, pregabalin, ramipril, ropinirole, spironolactone, and torsemide.  Pharmacotherapy (Medications Ordered): No orders of the defined types were placed in this encounter.  Orders:  No orders of the defined types were placed in this encounter.  Follow-up plan:   No follow-ups on  file.      Considering:   Diagnostic bilateral IA hip joint injection  Possible bilateral hip joint RFA  Possible bilateral lumbar facet RFA  Possible left SI joint RFA  Diagnostic bilateral L4-L5 TFESI  Diagnostic caudal ESI  Diagnostic caudal epidurogram  Possible RACZ epidural lysis of adhesions  Diagnostic left L2-3 LESI  Possible bilateral  Lumbar SCS trial implant    Palliative PRN treatment(s):   Diagnostic right lumbar facet block #2  Diagnostic left lumbar facet block #3  Diagnostic left SI joint block #3            Recent Visits Date Type Provider Dept  06/22/21 Procedure visit Milinda Pointer, MD [facility]  06/01/21 Office Visit Milinda Pointer, MD [facility]  05/04/21 Telemedicine Milinda Pointer, MD [facility]  Showing recent visits within past 90 days and meeting all other requirements Today's Visits Date Type Provider Dept  07/06/21 Office Visit Milinda Pointer, MD [facility]  Showing today's visits and meeting all other requirements Future Appointments Date Type Provider Dept  08/23/21 Appointment Milinda Pointer, MD [facility]  Showing future appointments within next 90 days and meeting all other requirements I discussed the assessment and treatment plan with the patient. The patient was provided an opportunity to ask questions and all were answered. The patient agreed with the plan and demonstrated an understanding of the instructions.  Patient advised to call back or seek an in-person evaluation if the symptoms or condition worsens.  Duration of encounter: 12 minutes.  Note by: Gaspar Cola, MD Date: 07/06/2021; Time: 5:01 PM

## 2021-07-06 ENCOUNTER — Ambulatory Visit: Payer: Medicare Other | Attending: Pain Medicine | Admitting: Pain Medicine

## 2021-07-06 DIAGNOSIS — M5417 Radiculopathy, lumbosacral region: Secondary | ICD-10-CM | POA: Diagnosis not present

## 2021-07-06 DIAGNOSIS — G894 Chronic pain syndrome: Secondary | ICD-10-CM | POA: Diagnosis not present

## 2021-07-06 DIAGNOSIS — M961 Postlaminectomy syndrome, not elsewhere classified: Secondary | ICD-10-CM

## 2021-07-06 DIAGNOSIS — M79605 Pain in left leg: Secondary | ICD-10-CM

## 2021-07-06 DIAGNOSIS — M79604 Pain in right leg: Secondary | ICD-10-CM | POA: Diagnosis not present

## 2021-07-06 DIAGNOSIS — M5442 Lumbago with sciatica, left side: Secondary | ICD-10-CM | POA: Diagnosis not present

## 2021-07-06 DIAGNOSIS — M4807 Spinal stenosis, lumbosacral region: Secondary | ICD-10-CM | POA: Insufficient documentation

## 2021-07-06 DIAGNOSIS — R937 Abnormal findings on diagnostic imaging of other parts of musculoskeletal system: Secondary | ICD-10-CM | POA: Insufficient documentation

## 2021-07-06 DIAGNOSIS — M48061 Spinal stenosis, lumbar region without neurogenic claudication: Secondary | ICD-10-CM

## 2021-07-06 DIAGNOSIS — G8929 Other chronic pain: Secondary | ICD-10-CM

## 2021-07-06 DIAGNOSIS — M5441 Lumbago with sciatica, right side: Secondary | ICD-10-CM

## 2021-07-07 ENCOUNTER — Encounter: Payer: Self-pay | Admitting: Pulmonary Disease

## 2021-07-07 ENCOUNTER — Other Ambulatory Visit: Payer: Self-pay

## 2021-07-07 ENCOUNTER — Ambulatory Visit (INDEPENDENT_AMBULATORY_CARE_PROVIDER_SITE_OTHER): Payer: Medicare Other | Admitting: Pulmonary Disease

## 2021-07-07 VITALS — BP 134/76 | HR 94 | Temp 98.4°F | Ht <= 58 in | Wt 93.4 lb

## 2021-07-07 DIAGNOSIS — J449 Chronic obstructive pulmonary disease, unspecified: Secondary | ICD-10-CM | POA: Diagnosis not present

## 2021-07-07 DIAGNOSIS — K219 Gastro-esophageal reflux disease without esophagitis: Secondary | ICD-10-CM

## 2021-07-07 DIAGNOSIS — F17210 Nicotine dependence, cigarettes, uncomplicated: Secondary | ICD-10-CM

## 2021-07-07 DIAGNOSIS — B370 Candidal stomatitis: Secondary | ICD-10-CM | POA: Diagnosis not present

## 2021-07-07 DIAGNOSIS — J984 Other disorders of lung: Secondary | ICD-10-CM | POA: Diagnosis not present

## 2021-07-07 MED ORDER — STIOLTO RESPIMAT 2.5-2.5 MCG/ACT IN AERS: 2.0000 | INHALATION_SPRAY | Freq: Every day | RESPIRATORY_TRACT | 0 refills | Status: AC

## 2021-07-07 MED ORDER — FLUCONAZOLE 100 MG PO TABS: 100.0000 mg | ORAL_TABLET | Freq: Every day | ORAL | 0 refills | Status: DC

## 2021-07-07 NOTE — Progress Notes (Signed)
Subjective:    Patient ID: Suzanne Avila, female    DOB: Sep 19, 1946, 75 y.o.   MRN: [id number] Chief Complaint  Patient presents with   Follow-up    Sob with exertion.    HPI Patient is a 75 year old current smoker (1 PPD, 31 PY) who presents for follow-up on the issue of COPD with baseline dyspnea class II and cavitary mass of the right upper lobe.  This is a scheduled visit.  The patient was last seen here on 18 November 2020, at that time a CT scan of the chest was ordered and follow-up after that was recommended.  PFTs were also ordered at that time.  She had these performed on 08 December 2020 and shows a combination of obstruction/restriction that "cancels out" each other.  He does have significant kyphosis and this may be the restrictive component.  She unfortunately continues to smoke 1 pack of cigarettes per day and has no inclination of quitting smoking.  She notes that Markus Daft helps her with her breathing however she has developed sores and a "white coat in her mouth".  She has had worsening reflux symptoms (not taking PPI as she should be doing) notes sore throat which she has had previously with severe GERD.  She has not had increasing shortness of breath.  Cough is baseline usually nonproductive and in the mornings.  Review of Systems A 10 point review of systems was performed and it is as noted above otherwise negative.  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) 11/02/2020   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   Cavitary lesion of lung 09/15/2020   AF (paroxysmal atrial fibrillation) (HCC) 09/15/2020   Failure to thrive in adult 09/15/2020   Severe protein-calorie malnutrition [name]: less than 60% of standard weight) (HCC) 09/15/2020   Dysphagia 09/15/2020   Hypotension 09/15/2020   Abscess of right lung with pneumonia (HCC) 09/14/2020   Chronic low back pain (Left) w/o sciatica 09/10/2020   Pharmacologic therapy 05/05/2020   Chronic diarrhea 05/04/2020   DDD (degenerative disc disease), lumbosacral 11/19/2019   Therapeutic opioid-induced constipation (OIC) 08/01/2019   Chronic sacroiliac joint pain (Left) 05/01/2019   Chronic groin pain (Left) 05/01/2019   Spondylosis without myelopathy or radiculopathy, lumbosacral region 05/01/2019   Other specified dorsopathies, sacral and sacrococcygeal region 05/01/2019   Renal artery stenosis (HCC) 12/27/2017   Pain in limb 12/22/2017   Tobacco use disorder 12/22/2017   Renal atrophy, right 12/22/2017   Thoracic radiculitis 01/31/2017   Chronic lower extremity pain (1ry area of Pain) (Bilateral) (L>R) 04/25/2016   Chronic upper extremity pain (3ry area of Pain) (Bilateral) (L>R) 04/25/2016   Disturbance of skin sensation 04/20/2016   Neurogenic pain 04/20/2016   Vitamin D deficiency 11/11/2015   Edema 09/02/2015   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   Long term current use of opiate analgesic 08/14/2015   Long term prescription opiate use 08/14/2015   Uncomplicated opioid dependence (HCC) 08/14/2015  Opiate use (30 MME/Day) 08/14/2015   Chronic low back pain (2ry area of Pain) (Bilateral) (L>R) w/ sciatica (Bilateral) 08/14/2015   Chronic pain syndrome 08/14/2015   Alcoholic (HCC) 08/14/2015   B12 deficiency 08/14/2015   Narrowing of intervertebral disc space 08/14/2015   History of metabolic disorder 08/14/2015    Fungal infection of toenail 08/14/2015   Osteopenia 08/14/2015   Compulsive tobacco user syndrome 08/14/2015   Osteoarthritis of hip (Left) 08/14/2015   Chronic hip pain (Left) 08/14/2015   Chronic lumbar radicular pain (L5 dermatome) (Bilateral) (L>R) 08/14/2015   Lumbar facet syndrome (Bilateral) (L>R) 08/14/2015   Lumbar spondylosis 08/14/2015   Lumbar Postlaminectomy syndrome (L4-5) 08/14/2015   Fibromyalgia 08/14/2015   Chronic airway obstruction (HCC) 04/21/2015   Allergic rhinitis 02/10/2010   Atrial paroxysmal tachycardia (HCC) 07/06/2009   CAFL (chronic airflow limitation) (HCC) 07/06/2009   Acid reflux 07/06/2009   Glaucoma 07/06/2009   Essential hypertension 07/06/2009   Hypercholesterolemia without hypertriglyceridemia 07/06/2009   Absence of bladder continence 07/06/2009    Social History   Tobacco Use   Smoking status: Every Day    Packs/day: 1.50    Years: 50.00    Pack years: 75.00    Types: Cigarettes   Smokeless tobacco: Never   Tobacco comments:    1PPD 07/07/2021  Substance Use Topics   Alcohol use: Yes    Alcohol/week: 35.0 standard drinks    Types: 35 Cans of beer per week    Comment: sytates she quit 09/23/2020   Allergies  Allergen Reactions   Codeine Itching   Cyclobenzaprine Itching   Penicillins     Itching without rash per patient.  Unable to say when it happened, other than "a long time ago"   Current Meds  Medication Sig   albuterol (PROVENTIL) (2.5 MG/3ML) 0.083% nebulizer solution Take 3 mLs (2.5 mg total) by nebulization every 6 (six) hours as needed for wheezing or shortness of breath.   albuterol (VENTOLIN HFA) 108 (90 Base) MCG/ACT inhaler Inhale 2 puffs into the lungs every 6 (six) hours as needed for wheezing or shortness of breath.   atorvastatin (LIPITOR) 40 MG tablet Take 1 tablet (40 mg total) by mouth daily.   Budeson-Glycopyrrol-Formoterol (BREZTRI AEROSPHERE) 160-9-4.8 MCG/ACT AERO Inhale 2 puffs into the lungs in the  morning and at bedtime.   diltiazem (CARDIZEM) 120 MG tablet Take 1 tablet (120 mg total) by mouth daily.   dorzolamide (TRUSOPT) 2 % ophthalmic solution Place 1 drop into both eyes 2 (two) times daily.   HYDROcodone-acetaminophen (NORCO) 7.5-325 MG tablet Take 1 tablet by mouth every 6 (six) hours as needed for severe pain. Must last 30 days   [START ON 08/02/2021] HYDROcodone-acetaminophen (NORCO) 7.5-325 MG tablet Take 1 tablet by mouth every 6 (six) hours as needed for severe pain. Must last 30 days   [START ON 09/01/2021] HYDROcodone-acetaminophen (NORCO) 7.5-325 MG tablet Take 1 tablet by mouth every 6 (six) hours as needed for severe pain. Must last 30 days   latanoprost (XALATAN) 0.005 % ophthalmic solution Place 1 drop into both eyes at bedtime.   magnesium gluconate (MAGONATE) 500 MG tablet Take 500 mg by mouth daily.   meloxicam (MOBIC) 15 MG tablet Take 1 tablet (15 mg total) by mouth daily.   mirtazapine (REMERON) 7.5 MG tablet Take 1 tablet (7.5 mg total) by mouth at bedtime.   Multiple Vitamin (MULTI-VITAMIN) tablet Take 1 tablet by mouth daily.    omeprazole (PRILOSEC) 40 MG capsule Take 1 capsule (40  mg total) by mouth daily.   pregabalin (LYRICA) 150 MG capsule Take 1 capsule (150 mg total) by mouth every 8 (eight) hours.   ramipril (ALTACE) 2.5 MG capsule Take 1 capsule (2.5 mg total) by mouth daily.   rOPINIRole (REQUIP) 2 MG tablet Take 1 tablet (2 mg total) by mouth 2 (two) times daily. (Patient taking differently: Take 2 mg by mouth at bedtime.)   spironolactone (ALDACTONE) 25 MG tablet Take 1 tablet (25 mg total) by mouth daily.   tizanidine (ZANAFLEX) 2 MG capsule Take 1 capsule (2 mg total) by mouth 3 (three) times daily as needed for muscle spasms.   torsemide (DEMADEX) 20 MG tablet TAKE 2 TABLETS TWICE A DAY   vitamin B-12 (CYANOCOBALAMIN) 500 MCG tablet Take 1 tablet (500 mcg total) by mouth daily.    Immunization History  Administered Date(s) Administered   Fluad  Quad(high Dose 65+) 08/02/2019, 08/07/2020   Influenza Split 07/14/2010, 08/16/2011   Influenza, High Dose Seasonal PF 09/02/2017, 09/27/2018   Influenza,inj,Quad PF,6+ Mos 07/09/2014   Moderna Sars-Covid-2 Vaccination 01/02/2020, 01/30/2020, 11/03/2020   Pneumococcal Conjugate-13 01/09/2014   Pneumococcal Polysaccharide-23 12/01/2009, 07/24/2016   Zoster Recombinat (Shingrix) 09/02/2017, 01/09/2019, 08/02/2019   Zoster, Live 05/23/2014      Objective:   Physical Exam BP 134/76 (BP Location: Left Arm, Cuff Size: Normal)   Pulse 94   Temp 98.4 F (36.9 C) (Temporal)   Ht 4\' 8"  (1.422 m)   Wt 93 lb 6.4 oz (42.4 kg)   SpO2 98%   BMI 20.94 kg/m   GENERAL: Small statured woman, conically ill-appearing, debilitated, no acute distress.   Presents in transport chair. HEAD: Normocephalic, atraumatic.  EYES: Pupils equal, round, reactive to light.  No scleral icterus.  MOUTH: Thrush noted. NECK: Supple. No thyromegaly. Trachea midline. No JVD.  No adenopathy. PULMONARY: Distant breath sounds bilaterally.  No adventitious sounds noted today.   CARDIOVASCULAR: S1 and S2. Regular rate and rhythm.  No rubs, murmurs or gallops heard. ABDOMEN: Benign. 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  NEUROLOGIC: No overt focal deficit.  Speech is fluent SKIN: Intact,warm,dry.  No cellulitis changes in the lower extremities. PSYCH: Mood and behavior normal.  Representative images of the CT scan of the chest performed 22 December 2020, independently reviewed:     Marked reduction on the previously noted lung abscess.  Assessment & Plan:     ICD-10-CM   1. COPD mixed type (HCC)  J44.9    Discontinue Breztri due to thrush Stiolto 2 puffs daily samples given to the patient Continue as needed albuterol    2. Cavitary lesion of lung  J98.4 CANCELED: CT Chest Wo Contrast    This continues to regress It was related to necrotizing pneumonia Continue monitoring    3. Oropharyngeal candidiasis  B37.0    Discontinued ICS component Fluconazole Address reflux    4. Gastroesophageal reflux disease, unspecified whether esophagitis present  K21.9    Patient instructed to resume PPI Patient states she has active Rx    5. Tobacco dependence due to cigarettes  F17.210    Patient counseled regards to discontinuation of smoking Not interested in quitting smoking     Meds ordered this encounter  Medications   Tiotropium Bromide-Olodaterol (STIOLTO RESPIMAT) 2.5-2.5 MCG/ACT AERS    Sig: Inhale 2 puffs into the lungs daily.    Dispense:  1 each  Refill:  0    Order Specific Question:   Lot Number?    Answer:   [id number]    Order Specific Question:   Expiration Date?    Answer:   05/24/2022    Order Specific Question:   Quantity    Answer:   2   Fluconazole (DIFLUCAN) 100 MG tablet    Sig: Take 1 tablet (100 mg total) by mouth daily for 14 days.    Dispense:  14 tablet    Refill:  0   C. Danice Goltz, MD Advanced Bronchoscopy PCCM [facility]    *This note was dictated using voice recognition software/Dragon.  Despite best efforts to proofread, errors can occur which can change the meaning. Any transcriptional errors that result from this process are unintentional and may not be fully corrected at the time of dictation.

## 2021-07-07 NOTE — Patient Instructions (Signed)
We are switching your inhaler to Stiolto which should hopefully prevent you getting thrush back again.  Make sure you take the medication for your reflux (omeprazole) this will help your throat pain.  STOP THE BREZTRI.  We are scheduling a CT scan of the chest to follow-up on the abscess you had in your lung.  We will see him in follow-up in 6 to 8 weeks time call sooner should any new problems arise.

## 2021-07-08 ENCOUNTER — Ambulatory Visit: Payer: Medicare Other | Admitting: Pain Medicine

## 2021-07-11 ENCOUNTER — Encounter (INDEPENDENT_AMBULATORY_CARE_PROVIDER_SITE_OTHER): Payer: Self-pay | Admitting: Nurse Practitioner

## 2021-07-11 NOTE — Progress Notes (Signed)
Subjective:    Patient ID: Suzanne Avila, female    DOB: [birth year], 75 y.o.   MRN: [id number] Chief Complaint  Patient presents with   Follow-up    1 yr renal US    Suzanne Avila is a 75 year old female that returns to the office for followup and review of the noninvasive studies regarding renal vascular hypertension and renal artery stenosis.  Today the patient's blood pressure is elevated however she notes that she has not taken her blood pressure medications today and she may not have taken them the day before.  He denies any major changes in is medications.  The patient denies headache or flushing.  No flank or unusual back pain.    There have been no significant changes to the patient's overall health care.  No interval shortening of the patient's walking distance or new symptoms consistent with claudication.  The patient denies the  development of rest pain symptoms. No new ulcers or wounds have occurred since the last visit.  The patient denies amaurosis fugax or recent TIA symptoms. There are no recent neurological changes noted. The patient denies history of DVT, PE or superficial thrombophlebitis. The patient denies recent episodes of angina or shortness of breath.   Duplex ultrasound of the renal arteries shows 1 to 59% stenosis bilaterally.  There is also a decrease in kidney size noted bilaterally.  Today the right kidney size is measured at 5.9 cm whereas it was previously measured at 6.66.  The left kidney is measured at 9.86 cm whereas it was previously measured at 11 cm.     Review of Systems  Cardiovascular:  Negative for leg swelling.  Musculoskeletal:  Negative for back pain.  All other systems reviewed and are negative.     Objective:   Physical Exam Vitals reviewed.  HENT:     Head: Normocephalic.  Cardiovascular:     Rate and Rhythm: Normal rate.     Pulses: Normal pulses.  Pulmonary:     Effort: Pulmonary effort is normal.  Skin:    General: Skin  is warm and dry.  Neurological:     Mental Status: She is alert and oriented to person, place, and time.  Psychiatric:        Mood and Affect: Mood normal.        Behavior: Behavior normal.        Thought Content: Thought content normal.        Judgment: Judgment normal.    BP (!) 195/93   Pulse 71   Ht 4\' 8"  (1.422 m)   Wt 79 lb (35.8 kg)   BMI 17.71 kg/m   Past Medical History:  Diagnosis Date   Closed left arm fracture    COPD (chronic obstructive pulmonary disease) (HCC)    Dehydration 09/15/2020   Glaucoma    Hyperlipidemia    Hypertension    Lung mass     Social History   Socioeconomic History   Marital status: Married    Spouse name: Not on file   Number of children: 2   Years of education: Not on file   Highest education level: 12th grade  Occupational History   Occupation: disabled   Occupation: retired  Tobacco Use   Smoking status: Every Day    Packs/day: 1.50    Years: 50.00    Pack years: 75.00    Types: Cigarettes   Smokeless tobacco: Never   Tobacco comments:    1PPD 07/07/2021  Vaping Use  Vaping Use: Former  Substance and Sexual Activity   Alcohol use: Yes    Alcohol/week: 35.0 standard drinks    Types: 35 Cans of beer per week    Comment: sytates she quit 09/23/2020   Drug use: No   Sexual activity: Not Currently  Other Topics Concern   Not on file  Social History Narrative   Not on file   Social Determinants of Health   Financial Resource Strain: Low Risk    Difficulty of Paying Living Expenses: Not hard at all  Food Insecurity: Not on file  Transportation Needs: Not on file  Physical Activity: Not on file  Stress: Not on file  Social Connections: Not on file  Intimate Partner Violence: Not on file    Past Surgical History:  Procedure Laterality Date   BACK SURGERY     CARPAL TUNNEL RELEASE Left    EYE SURGERY      Family History  Problem Relation Age of Onset   Cancer Mother    Cancer Father     Allergies   Allergen Reactions   Codeine Itching   Cyclobenzaprine Itching   Penicillins     Itching without rash per patient.  Unable to say when it happened, other than "a long time ago"    CBC Latest Ref Rng & Units 01/20/2021 09/15/2020 09/14/2020  WBC 3.4 - 10.8 x10E3/uL 10.6 12.5(H) 12.4(H)  Hemoglobin 11.1 - 15.9 g/dL 41.6 9.9(L) 12.1  Hematocrit 34.0 - 46.6 % 39.9 27.5(L) 33.9(L)  Platelets 150 - 450 x10E3/uL 397 321 378      CMP     Component Value Date/Time   NA 140 05/21/2021 0930   NA 135 (L) 03/22/2014 2245   K 5.0 05/21/2021 0930   K 2.9 (L) 03/22/2014 2245   CL 103 05/21/2021 0930   CL 102 03/22/2014 2245   CO2 22 05/21/2021 0930   CO2 23 03/22/2014 2245   GLUCOSE 79 05/21/2021 0930   GLUCOSE 74 09/17/2020 0545   GLUCOSE 77 03/22/2014 2245   BUN 17 05/21/2021 0930   BUN 7 03/22/2014 2245   CREATININE 0.97 05/21/2021 0930   CREATININE 0.91 03/22/2014 2245   CALCIUM 9.2 05/21/2021 0930   CALCIUM 9.2 03/22/2014 2245   PROT 6.3 05/21/2021 0930   PROT 7.7 03/22/2014 2245   ALBUMIN 4.1 05/21/2021 0930   ALBUMIN 3.9 03/22/2014 2245   AST 15 05/21/2021 0930   AST 44 (H) 03/22/2014 2245   ALT 11 05/21/2021 0930   ALT 32 03/22/2014 2245   ALKPHOS 130 (H) 05/21/2021 0930   ALKPHOS 137 (H) 03/22/2014 2245   BILITOT 0.4 05/21/2021 0930   BILITOT 0.7 03/22/2014 2245   GFRNONAA 54 (L) 11/11/2020 1448   GFRNONAA >60 09/17/2020 0545   GFRNONAA >60 03/22/2014 2245   GFRAA 62 11/11/2020 1448   GFRAA >60 03/22/2014 2245     No results found.     Assessment & Plan:   1. Renal artery stenosis (HCC) I discussed the ultrasound findings with the patient.  While she does not have evidence of significant stenosis noted by ultrasound there has been a noticeable decrease in her kidney size bilaterally.  Typically decrease in kidney size can be evidence of hypoperfusion to the kidneys themselves.  Because of this I have suggested that we either obtain a CT scan or undergo a renal  angiogram to determine if there is some level of significant stenosis that has not seen currently on ultrasound.  We also discussed the  possibility of close follow-up for monitoring.  The patient notes that she does not understand her options and will like her son to make the decision for her.  We will attempt to contact her son and discuss these options with him and follow-up from there.  2. Benign essential HTN Patient's blood pressure is elevated today she notes that she did not take it today and also notes that she may not have taken yesterday.  Patient advised to take her medicines when she returns home.  3. Compulsive tobacco user syndrome Smoking cessation was discussed, 3-10 minutes spent on this topic specifically   4. Hypercholesterolemia without hypertriglyceridemia Continue statin as ordered and reviewed, no changes at this time    Current Outpatient Medications on File Prior to Visit  Medication Sig Dispense Refill   albuterol (PROVENTIL) (2.5 MG/3ML) 0.083% nebulizer solution Take 3 mLs (2.5 mg total) by nebulization every 6 (six) hours as needed for wheezing or shortness of breath. 150 mL 1   albuterol (VENTOLIN HFA) 108 (90 Base) MCG/ACT inhaler Inhale 2 puffs into the lungs every 6 (six) hours as needed for wheezing or shortness of breath. 18 g 6   atorvastatin (LIPITOR) 40 MG tablet Take 1 tablet (40 mg total) by mouth daily. 90 tablet 3   diltiazem (CARDIZEM) 120 MG tablet Take 1 tablet (120 mg total) by mouth daily. 90 tablet 2   dorzolamide (TRUSOPT) 2 % ophthalmic solution Place 1 drop into both eyes 2 (two) times daily. 10 mL 5   HYDROcodone-acetaminophen (NORCO) 7.5-325 MG tablet Take 1 tablet by mouth every 6 (six) hours as needed for severe pain. Must last 30 days 120 tablet 0   [START ON 08/02/2021] HYDROcodone-acetaminophen (NORCO) 7.5-325 MG tablet Take 1 tablet by mouth every 6 (six) hours as needed for severe pain. Must last 30 days 120 tablet 0   [START ON  09/01/2021] HYDROcodone-acetaminophen (NORCO) 7.5-325 MG tablet Take 1 tablet by mouth every 6 (six) hours as needed for severe pain. Must last 30 days 120 tablet 0   latanoprost (XALATAN) 0.005 % ophthalmic solution Place 1 drop into both eyes at bedtime. 2.5 mL 3   magnesium gluconate (MAGONATE) 500 MG tablet Take 500 mg by mouth daily.     meloxicam (MOBIC) 15 MG tablet Take 1 tablet (15 mg total) by mouth daily. 90 tablet 1   mirtazapine (REMERON) 7.5 MG tablet Take 1 tablet (7.5 mg total) by mouth at bedtime. 90 tablet 2   Multiple Vitamin (MULTI-VITAMIN) tablet Take 1 tablet by mouth daily.      omeprazole (PRILOSEC) 40 MG capsule Take 1 capsule (40 mg total) by mouth daily. 90 capsule 3   pregabalin (LYRICA) 150 MG capsule Take 1 capsule (150 mg total) by mouth every 8 (eight) hours. 270 capsule 1   ramipril (ALTACE) 2.5 MG capsule Take 1 capsule (2.5 mg total) by mouth daily. 90 capsule 3   rOPINIRole (REQUIP) 2 MG tablet Take 1 tablet (2 mg total) by mouth 2 (two) times daily. (Patient taking differently: Take 2 mg by mouth at bedtime.) 180 tablet 2   spironolactone (ALDACTONE) 25 MG tablet Take 1 tablet (25 mg total) by mouth daily. 90 tablet 2   tizanidine (ZANAFLEX) 2 MG capsule Take 1 capsule (2 mg total) by mouth 3 (three) times daily as needed for muscle spasms. 90 capsule 1   torsemide (DEMADEX) 20 MG tablet TAKE 2 TABLETS TWICE A DAY 360 tablet 1   vitamin B-12 (CYANOCOBALAMIN) 500 MCG  tablet Take 1 tablet (500 mcg total) by mouth daily. 90 tablet 3   No current facility-administered medications on file prior to visit.    There are no Patient Instructions on file for this visit. No follow-ups on file.   Georgiana Spinner, NP

## 2021-07-21 ENCOUNTER — Inpatient Hospital Stay: Payer: Medicare Other

## 2021-07-21 ENCOUNTER — Other Ambulatory Visit: Payer: Self-pay

## 2021-07-21 ENCOUNTER — Encounter: Payer: Self-pay | Admitting: Emergency Medicine

## 2021-07-21 ENCOUNTER — Encounter
Admission: EM | Disposition: A | Discharge status: Skilled Nursing Facility | Payer: Medicare Other | Source: Home / Self Care | Attending: Internal Medicine

## 2021-07-21 ENCOUNTER — Inpatient Hospital Stay: Payer: Medicare Other | Admitting: Anesthesiology

## 2021-07-21 ENCOUNTER — Inpatient Hospital Stay
Admission: EM | Admit: 2021-07-21 | Discharge: 2021-07-26 | DRG: 480 | Disposition: A | Discharge status: Skilled Nursing Facility | Payer: Medicare Other | Attending: Internal Medicine | Admitting: Internal Medicine

## 2021-07-21 ENCOUNTER — Emergency Department: Payer: Medicare Other

## 2021-07-21 DIAGNOSIS — S728X1A Other fracture of right femur, initial encounter for closed fracture: Secondary | ICD-10-CM

## 2021-07-21 DIAGNOSIS — I700 Atherosclerosis of aorta: Secondary | ICD-10-CM | POA: Diagnosis present

## 2021-07-21 DIAGNOSIS — J449 Chronic obstructive pulmonary disease, unspecified: Secondary | ICD-10-CM | POA: Diagnosis present

## 2021-07-21 DIAGNOSIS — S80211A Abrasion, right knee, initial encounter: Secondary | ICD-10-CM | POA: Diagnosis present

## 2021-07-21 DIAGNOSIS — W19XXXA Unspecified fall, initial encounter: Secondary | ICD-10-CM | POA: Diagnosis not present

## 2021-07-21 DIAGNOSIS — I444 Left anterior fascicular block: Secondary | ICD-10-CM | POA: Diagnosis present

## 2021-07-21 DIAGNOSIS — E43 Unspecified severe protein-calorie malnutrition: Secondary | ICD-10-CM | POA: Diagnosis present

## 2021-07-21 DIAGNOSIS — M160 Bilateral primary osteoarthritis of hip: Secondary | ICD-10-CM | POA: Diagnosis present

## 2021-07-21 DIAGNOSIS — W010XXA Fall on same level from slipping, tripping and stumbling without subsequent striking against object, initial encounter: Secondary | ICD-10-CM | POA: Diagnosis present

## 2021-07-21 DIAGNOSIS — Z9889 Other specified postprocedural states: Secondary | ICD-10-CM

## 2021-07-21 DIAGNOSIS — F32A Depression, unspecified: Secondary | ICD-10-CM | POA: Diagnosis present

## 2021-07-21 DIAGNOSIS — E871 Hypo-osmolality and hyponatremia: Secondary | ICD-10-CM | POA: Diagnosis present

## 2021-07-21 DIAGNOSIS — Z885 Allergy status to narcotic agent status: Secondary | ICD-10-CM

## 2021-07-21 DIAGNOSIS — E875 Hyperkalemia: Secondary | ICD-10-CM | POA: Diagnosis present

## 2021-07-21 DIAGNOSIS — Y92009 Unspecified place in unspecified non-institutional (private) residence as the place of occurrence of the external cause: Secondary | ICD-10-CM

## 2021-07-21 DIAGNOSIS — I5032 Chronic diastolic (congestive) heart failure: Secondary | ICD-10-CM | POA: Diagnosis present

## 2021-07-21 DIAGNOSIS — D62 Acute posthemorrhagic anemia: Secondary | ICD-10-CM | POA: Diagnosis not present

## 2021-07-21 DIAGNOSIS — E785 Hyperlipidemia, unspecified: Secondary | ICD-10-CM | POA: Diagnosis present

## 2021-07-21 DIAGNOSIS — Z20822 Contact with and (suspected) exposure to covid-19: Secondary | ICD-10-CM | POA: Diagnosis present

## 2021-07-21 DIAGNOSIS — F17210 Nicotine dependence, cigarettes, uncomplicated: Secondary | ICD-10-CM | POA: Diagnosis present

## 2021-07-21 DIAGNOSIS — I10 Essential (primary) hypertension: Secondary | ICD-10-CM | POA: Diagnosis present

## 2021-07-21 DIAGNOSIS — I110 Hypertensive heart disease with heart failure: Secondary | ICD-10-CM | POA: Diagnosis present

## 2021-07-21 DIAGNOSIS — Z23 Encounter for immunization: Secondary | ICD-10-CM | POA: Diagnosis present

## 2021-07-21 DIAGNOSIS — I480 Paroxysmal atrial fibrillation: Secondary | ICD-10-CM | POA: Diagnosis present

## 2021-07-21 DIAGNOSIS — S72021A Displaced fracture of epiphysis (separation) (upper) of right femur, initial encounter for closed fracture: Secondary | ICD-10-CM

## 2021-07-21 DIAGNOSIS — Y92013 Bedroom of single-family (private) house as the place of occurrence of the external cause: Secondary | ICD-10-CM

## 2021-07-21 DIAGNOSIS — S7291XA Unspecified fracture of right femur, initial encounter for closed fracture: Secondary | ICD-10-CM | POA: Diagnosis present

## 2021-07-21 DIAGNOSIS — Z6820 Body mass index (BMI) 20.0-20.9, adult: Secondary | ICD-10-CM

## 2021-07-21 DIAGNOSIS — N179 Acute kidney failure, unspecified: Secondary | ICD-10-CM | POA: Diagnosis present

## 2021-07-21 DIAGNOSIS — Z880 Allergy status to penicillin: Secondary | ICD-10-CM | POA: Diagnosis not present

## 2021-07-21 DIAGNOSIS — F17200 Nicotine dependence, unspecified, uncomplicated: Secondary | ICD-10-CM | POA: Diagnosis present

## 2021-07-21 DIAGNOSIS — Z888 Allergy status to other drugs, medicaments and biological substances status: Secondary | ICD-10-CM

## 2021-07-21 DIAGNOSIS — R000 Tachycardia, unspecified: Secondary | ICD-10-CM | POA: Diagnosis present

## 2021-07-21 DIAGNOSIS — H409 Unspecified glaucoma: Secondary | ICD-10-CM | POA: Diagnosis present

## 2021-07-21 DIAGNOSIS — R42 Dizziness and giddiness: Secondary | ICD-10-CM | POA: Diagnosis not present

## 2021-07-21 DIAGNOSIS — S72401A Unspecified fracture of lower end of right femur, initial encounter for closed fracture: Secondary | ICD-10-CM | POA: Diagnosis present

## 2021-07-21 DIAGNOSIS — Z8781 Personal history of (healed) traumatic fracture: Secondary | ICD-10-CM

## 2021-07-21 DIAGNOSIS — G2581 Restless legs syndrome: Secondary | ICD-10-CM

## 2021-07-21 DIAGNOSIS — Z419 Encounter for procedure for purposes other than remedying health state, unspecified: Secondary | ICD-10-CM

## 2021-07-21 HISTORY — PX: FEMUR IM NAIL: SHX1597

## 2021-07-21 LAB — BASIC METABOLIC PANEL
Anion gap: 14 (ref 5–15)
BUN: 38 mg/dL — ABNORMAL HIGH (ref 8–23)
CO2: 22 mmol/L (ref 22–32)
Calcium: 8.7 mg/dL — ABNORMAL LOW (ref 8.9–10.3)
Chloride: 94 mmol/L — ABNORMAL LOW (ref 98–111)
Creatinine, Ser: 1.47 mg/dL — ABNORMAL HIGH (ref 0.44–1.00)
GFR, Estimated: 37 mL/min — ABNORMAL LOW (ref >60)
Glucose, Bld: 103 mg/dL — ABNORMAL HIGH (ref 70–99)
Potassium: 5.2 mmol/L — ABNORMAL HIGH (ref 3.5–5.1)
Sodium: 130 mmol/L — ABNORMAL LOW (ref 135–145)

## 2021-07-21 LAB — CBC WITH DIFFERENTIAL/PLATELET
Abs Immature Granulocytes: 0.04 10*3/uL (ref 0.00–0.07)
Basophils Absolute: 0.1 10*3/uL (ref 0.0–0.1)
Basophils Relative: 1 %
Eosinophils Absolute: 0.1 10*3/uL (ref 0.0–0.5)
Eosinophils Relative: 1 %
HCT: 30.5 % — ABNORMAL LOW (ref 36.0–46.0)
Hemoglobin: 10.3 g/dL — ABNORMAL LOW (ref 12.0–15.0)
Immature Granulocytes: 0 %
Lymphocytes Relative: 9 %
Lymphs Abs: 1 10*3/uL (ref 0.7–4.0)
MCH: 27.4 pg (ref 26.0–34.0)
MCHC: 33.8 g/dL (ref 30.0–36.0)
MCV: 81.1 fL (ref 80.0–100.0)
Monocytes Absolute: 1.2 10*3/uL — ABNORMAL HIGH (ref 0.1–1.0)
Monocytes Relative: 11 %
Neutro Abs: 8.5 10*3/uL — ABNORMAL HIGH (ref 1.7–7.7)
Neutrophils Relative %: 78 %
Platelets: 289 10*3/uL (ref 150–400)
RBC: 3.76 MIL/uL — ABNORMAL LOW (ref 3.87–5.11)
RDW: 17.9 % — ABNORMAL HIGH (ref 11.5–15.5)
Smear Review: NORMAL
WBC: 10.8 10*3/uL — ABNORMAL HIGH (ref 4.0–10.5)
nRBC: 0 % (ref 0.0–0.2)

## 2021-07-21 LAB — PROTIME-INR
INR: 1 (ref 0.8–1.2)
Prothrombin Time: 12.9 seconds (ref 11.4–15.2)

## 2021-07-21 LAB — RESP PANEL BY RT-PCR (FLU A&B, COVID) ARPGX2
Influenza A by PCR: NEGATIVE
Influenza B by PCR: NEGATIVE
SARS Coronavirus 2 by RT PCR: NEGATIVE

## 2021-07-21 SURGERY — INSERTION, INTRAMEDULLARY ROD, FEMUR, RETROGRADE
Anesthesia: General | Laterality: Right

## 2021-07-21 MED ORDER — FENTANYL CITRATE PF 50 MCG/ML IJ SOSY
50.0000 ug | PREFILLED_SYRINGE | INTRAMUSCULAR | Status: DC | PRN
  Administered 2021-07-21: 50 ug via INTRAVENOUS
  Filled 2021-07-21: qty 1

## 2021-07-21 MED ORDER — OXYCODONE HCL 5 MG PO TABS: 5.0000 mg | ORAL_TABLET | Freq: Once | ORAL | Status: DC | PRN

## 2021-07-21 MED ORDER — SODIUM CHLORIDE 0.9 % IV SOLN: INTRAVENOUS | Status: AC

## 2021-07-21 MED ORDER — ARFORMOTEROL TARTRATE 15 MCG/2ML IN NEBU
15.0000 ug | INHALATION_SOLUTION | Freq: Two times a day (BID) | RESPIRATORY_TRACT | Status: DC
  Administered 2021-07-22 – 2021-07-26 (×9): 15 ug via RESPIRATORY_TRACT
  Filled 2021-07-21 (×12): qty 2

## 2021-07-21 MED ORDER — FENTANYL CITRATE (PF) 100 MCG/2ML IJ SOLN
INTRAMUSCULAR | Status: DC | PRN
  Administered 2021-07-21: 50 ug via INTRAVENOUS
  Administered 2021-07-21: 25 ug via INTRAVENOUS

## 2021-07-21 MED ORDER — METOCLOPRAMIDE HCL 10 MG PO TABS: 5.0000 mg | ORAL_TABLET | Freq: Three times a day (TID) | ORAL | Status: DC | PRN

## 2021-07-21 MED ORDER — IPRATROPIUM-ALBUTEROL 0.5-2.5 (3) MG/3ML IN SOLN
RESPIRATORY_TRACT | Status: AC
  Administered 2021-07-21: 3 mL via RESPIRATORY_TRACT
  Filled 2021-07-21: qty 3

## 2021-07-21 MED ORDER — OXYCODONE HCL 5 MG PO TABS: 10.0000 mg | ORAL_TABLET | ORAL | Status: DC | PRN

## 2021-07-21 MED ORDER — ACETAMINOPHEN 10 MG/ML IV SOLN: 1000.0000 mg | Freq: Once | INTRAVENOUS | Status: DC | PRN

## 2021-07-21 MED ORDER — SODIUM CHLORIDE 0.9 % IR SOLN
Status: DC | PRN
  Administered 2021-07-21: 200 mL

## 2021-07-21 MED ORDER — ONDANSETRON HCL 4 MG/2ML IJ SOLN
4.0000 mg | Freq: Four times a day (QID) | INTRAMUSCULAR | Status: DC | PRN
  Administered 2021-07-26: 4 mg via INTRAVENOUS
  Filled 2021-07-21: qty 2

## 2021-07-21 MED ORDER — CEFAZOLIN SODIUM-DEXTROSE 1-4 GM/50ML-% IV SOLN
INTRAVENOUS | Status: AC
  Filled 2021-07-21: qty 50

## 2021-07-21 MED ORDER — SPIRONOLACTONE 25 MG PO TABS: 25.0000 mg | ORAL_TABLET | Freq: Every day | ORAL | Status: DC

## 2021-07-21 MED ORDER — DEXMEDETOMIDINE (PRECEDEX) IN NS 20 MCG/5ML (4 MCG/ML) IV SYRINGE
PREFILLED_SYRINGE | INTRAVENOUS | Status: DC | PRN
  Administered 2021-07-21 (×2): 8 ug via INTRAVENOUS
  Administered 2021-07-21: 4 ug via INTRAVENOUS

## 2021-07-21 MED ORDER — PHENYLEPHRINE HCL (PRESSORS) 10 MG/ML IV SOLN
INTRAVENOUS | Status: DC | PRN
  Administered 2021-07-21: 50 ug via INTRAVENOUS
  Administered 2021-07-21: 100 ug via INTRAVENOUS

## 2021-07-21 MED ORDER — PREGABALIN 75 MG PO CAPS: 150.0000 mg | ORAL_CAPSULE | Freq: Three times a day (TID) | ORAL | Status: DC

## 2021-07-21 MED ORDER — HYDROMORPHONE HCL 1 MG/ML IJ SOLN: 0.5000 mg | INTRAMUSCULAR | Status: DC | PRN

## 2021-07-21 MED ORDER — ALUM & MAG HYDROXIDE-SIMETH 200-200-20 MG/5ML PO SUSP: 30.0000 mL | ORAL | Status: DC | PRN

## 2021-07-21 MED ORDER — PHENOL 1.4 % MT LIQD
1.0000 | OROMUCOSAL | Status: DC | PRN
  Filled 2021-07-21: qty 177

## 2021-07-21 MED ORDER — DILTIAZEM HCL 30 MG PO TABS
120.0000 mg | ORAL_TABLET | Freq: Every day | ORAL | Status: DC
  Administered 2021-07-22 – 2021-07-26 (×5): 120 mg via ORAL
  Filled 2021-07-21 (×5): qty 4

## 2021-07-21 MED ORDER — ACETAMINOPHEN 325 MG PO TABS: 325.0000 mg | ORAL_TABLET | Freq: Four times a day (QID) | ORAL | Status: DC | PRN

## 2021-07-21 MED ORDER — SUGAMMADEX SODIUM 200 MG/2ML IV SOLN
INTRAVENOUS | Status: DC | PRN
  Administered 2021-07-21: 150 mg via INTRAVENOUS

## 2021-07-21 MED ORDER — DEXAMETHASONE SODIUM PHOSPHATE 10 MG/ML IJ SOLN
INTRAMUSCULAR | Status: DC | PRN
  Administered 2021-07-21: 10 mg via INTRAVENOUS

## 2021-07-21 MED ORDER — RAMIPRIL 2.5 MG PO CAPS
2.5000 mg | ORAL_CAPSULE | Freq: Every day | ORAL | Status: DC
  Filled 2021-07-21: qty 1

## 2021-07-21 MED ORDER — DORZOLAMIDE HCL 2 % OP SOLN
1.0000 [drp] | Freq: Two times a day (BID) | OPHTHALMIC | Status: DC
  Administered 2021-07-22 – 2021-07-26 (×7): 1 [drp] via OPHTHALMIC
  Filled 2021-07-21: qty 10

## 2021-07-21 MED ORDER — PROPOFOL 10 MG/ML IV BOLUS
INTRAVENOUS | Status: DC | PRN
  Administered 2021-07-21: 50 mg via INTRAVENOUS

## 2021-07-21 MED ORDER — METHOCARBAMOL 1000 MG/10ML IJ SOLN
500.0000 mg | Freq: Four times a day (QID) | INTRAVENOUS | Status: DC | PRN
  Filled 2021-07-21: qty 5

## 2021-07-21 MED ORDER — TIZANIDINE HCL 2 MG PO TABS
2.0000 mg | ORAL_TABLET | Freq: Three times a day (TID) | ORAL | Status: DC | PRN
  Administered 2021-07-22: 2 mg via ORAL
  Filled 2021-07-21 (×2): qty 1

## 2021-07-21 MED ORDER — ROPINIROLE HCL 1 MG PO TABS
2.0000 mg | ORAL_TABLET | Freq: Two times a day (BID) | ORAL | Status: DC
  Administered 2021-07-21 – 2021-07-25 (×9): 2 mg via ORAL
  Filled 2021-07-21 (×12): qty 2

## 2021-07-21 MED ORDER — OXYCODONE HCL 5 MG PO TABS: 5.0000 mg | ORAL_TABLET | ORAL | Status: DC | PRN

## 2021-07-21 MED ORDER — NEOMYCIN-POLYMYXIN B GU 40-200000 IR SOLN
Status: AC
  Filled 2021-07-21: qty 2

## 2021-07-21 MED ORDER — POLYETHYLENE GLYCOL 3350 17 G PO PACK: 17.0000 g | PACK | Freq: Every day | ORAL | Status: DC | PRN

## 2021-07-21 MED ORDER — LATANOPROST 0.005 % OP SOLN
1.0000 [drp] | Freq: Every day | OPHTHALMIC | Status: DC
  Administered 2021-07-21 – 2021-07-25 (×5): 1 [drp] via OPHTHALMIC
  Filled 2021-07-21: qty 2.5

## 2021-07-21 MED ORDER — FLUCONAZOLE 100 MG PO TABS
100.0000 mg | ORAL_TABLET | Freq: Every day | ORAL | Status: DC
  Administered 2021-07-22: 100 mg via ORAL
  Filled 2021-07-21 (×2): qty 1

## 2021-07-21 MED ORDER — MORPHINE SULFATE (PF) 2 MG/ML IV SOLN: 0.5000 mg | INTRAVENOUS | Status: DC | PRN

## 2021-07-21 MED ORDER — MIRTAZAPINE 15 MG PO TABS
7.5000 mg | ORAL_TABLET | Freq: Every day | ORAL | Status: DC
  Administered 2021-07-21 – 2021-07-25 (×5): 7.5 mg via ORAL
  Filled 2021-07-21 (×5): qty 1

## 2021-07-21 MED ORDER — MENTHOL 3 MG MT LOZG
1.0000 | LOZENGE | OROMUCOSAL | Status: DC | PRN
  Filled 2021-07-21: qty 9

## 2021-07-21 MED ORDER — SODIUM CHLORIDE 0.9 % IV SOLN: INTRAVENOUS | Status: DC

## 2021-07-21 MED ORDER — KETAMINE HCL 50 MG/5ML IJ SOSY
PREFILLED_SYRINGE | INTRAMUSCULAR | Status: AC
  Filled 2021-07-21: qty 5

## 2021-07-21 MED ORDER — DOCUSATE SODIUM 100 MG PO CAPS
100.0000 mg | ORAL_CAPSULE | Freq: Two times a day (BID) | ORAL | Status: DC
  Administered 2021-07-21 – 2021-07-26 (×7): 100 mg via ORAL
  Filled 2021-07-21 (×9): qty 1

## 2021-07-21 MED ORDER — FENTANYL CITRATE (PF) 100 MCG/2ML IJ SOLN
INTRAMUSCULAR | Status: AC
  Administered 2021-07-21: 25 ug via INTRAVENOUS
  Filled 2021-07-21: qty 2

## 2021-07-21 MED ORDER — LIDOCAINE HCL (CARDIAC) PF 100 MG/5ML IV SOSY
PREFILLED_SYRINGE | INTRAVENOUS | Status: DC | PRN
  Administered 2021-07-21: 30 mg via INTRAVENOUS

## 2021-07-21 MED ORDER — FENTANYL CITRATE (PF) 100 MCG/2ML IJ SOLN
INTRAMUSCULAR | Status: AC
  Filled 2021-07-21: qty 2

## 2021-07-21 MED ORDER — MAGNESIUM HYDROXIDE 400 MG/5ML PO SUSP
30.0000 mL | Freq: Every day | ORAL | Status: DC
  Administered 2021-07-21 – 2021-07-26 (×4): 30 mL via ORAL
  Filled 2021-07-21 (×6): qty 30

## 2021-07-21 MED ORDER — TORSEMIDE 20 MG PO TABS: 40.0000 mg | ORAL_TABLET | Freq: Two times a day (BID) | ORAL | Status: DC

## 2021-07-21 MED ORDER — UMECLIDINIUM BROMIDE 62.5 MCG/INH IN AEPB
1.0000 | INHALATION_SPRAY | Freq: Every day | RESPIRATORY_TRACT | Status: DC
  Administered 2021-07-22 – 2021-07-26 (×5): 1 via RESPIRATORY_TRACT
  Filled 2021-07-21: qty 7

## 2021-07-21 MED ORDER — ONDANSETRON HCL 4 MG/2ML IJ SOLN
4.0000 mg | Freq: Four times a day (QID) | INTRAMUSCULAR | Status: DC | PRN
Start: 2021-07-21 — End: 2021-07-21
  Administered 2021-07-21 (×2): 4 mg via INTRAVENOUS
  Filled 2021-07-21: qty 2

## 2021-07-21 MED ORDER — ONDANSETRON HCL 4 MG PO TABS: 4.0000 mg | ORAL_TABLET | Freq: Four times a day (QID) | ORAL | Status: DC | PRN

## 2021-07-21 MED ORDER — TRAMADOL HCL 50 MG PO TABS
50.0000 mg | ORAL_TABLET | Freq: Four times a day (QID) | ORAL | Status: DC
  Administered 2021-07-21 – 2021-07-26 (×20): 50 mg via ORAL
  Filled 2021-07-21 (×20): qty 1

## 2021-07-21 MED ORDER — CEFAZOLIN SODIUM-DEXTROSE 1-4 GM/50ML-% IV SOLN
1.0000 g | INTRAVENOUS | Status: AC
  Administered 2021-07-21: 1 g via INTRAVENOUS
  Filled 2021-07-21: qty 50

## 2021-07-21 MED ORDER — ALBUTEROL SULFATE (2.5 MG/3ML) 0.083% IN NEBU: 2.5000 mg | INHALATION_SOLUTION | Freq: Four times a day (QID) | RESPIRATORY_TRACT | Status: DC | PRN

## 2021-07-21 MED ORDER — HYDROCODONE-ACETAMINOPHEN 5-325 MG PO TABS: 1.0000 | ORAL_TABLET | Freq: Four times a day (QID) | ORAL | Status: DC | PRN

## 2021-07-21 MED ORDER — TETANUS-DIPHTH-ACELL PERTUSSIS 5-2.5-18.5 LF-MCG/0.5 IM SUSY
0.5000 mL | PREFILLED_SYRINGE | Freq: Once | INTRAMUSCULAR | Status: AC
  Administered 2021-07-21: 0.5 mL via INTRAMUSCULAR
  Filled 2021-07-21: qty 0.5

## 2021-07-21 MED ORDER — PANTOPRAZOLE SODIUM 40 MG PO TBEC
40.0000 mg | DELAYED_RELEASE_TABLET | Freq: Every day | ORAL | Status: DC
  Administered 2021-07-22 – 2021-07-26 (×5): 40 mg via ORAL
  Filled 2021-07-21 (×5): qty 1

## 2021-07-21 MED ORDER — ROCURONIUM BROMIDE 100 MG/10ML IV SOLN
INTRAVENOUS | Status: DC | PRN
  Administered 2021-07-21 (×2): 10 mg via INTRAVENOUS
  Administered 2021-07-21: 30 mg via INTRAVENOUS

## 2021-07-21 MED ORDER — METOCLOPRAMIDE HCL 5 MG/ML IJ SOLN: 5.0000 mg | Freq: Three times a day (TID) | INTRAMUSCULAR | Status: DC | PRN

## 2021-07-21 MED ORDER — CEFAZOLIN SODIUM-DEXTROSE 1-4 GM/50ML-% IV SOLN
1.0000 g | Freq: Four times a day (QID) | INTRAVENOUS | Status: AC
  Administered 2021-07-21 – 2021-07-22 (×3): 1 g via INTRAVENOUS
  Filled 2021-07-21 (×4): qty 50

## 2021-07-21 MED ORDER — TRANEXAMIC ACID-NACL 1000-0.7 MG/100ML-% IV SOLN
INTRAVENOUS | Status: AC
  Filled 2021-07-21: qty 100

## 2021-07-21 MED ORDER — MAGNESIUM GLUCONATE 500 MG PO TABS
500.0000 mg | ORAL_TABLET | Freq: Every day | ORAL | Status: DC
  Administered 2021-07-22 – 2021-07-26 (×5): 500 mg via ORAL
  Filled 2021-07-21 (×6): qty 1

## 2021-07-21 MED ORDER — TRANEXAMIC ACID-NACL 1000-0.7 MG/100ML-% IV SOLN
1000.0000 mg | Freq: Once | INTRAVENOUS | Status: AC
  Administered 2021-07-21: 1000 mg via INTRAVENOUS

## 2021-07-21 MED ORDER — ENOXAPARIN SODIUM 30 MG/0.3ML IJ SOSY
30.0000 mg | PREFILLED_SYRINGE | INTRAMUSCULAR | Status: DC
  Filled 2021-07-21: qty 0.3

## 2021-07-21 MED ORDER — FENTANYL CITRATE (PF) 100 MCG/2ML IJ SOLN
25.0000 ug | INTRAMUSCULAR | Status: DC | PRN
  Administered 2021-07-21 (×3): 25 ug via INTRAVENOUS

## 2021-07-21 MED ORDER — CYANOCOBALAMIN 500 MCG PO TABS
500.0000 ug | ORAL_TABLET | Freq: Every day | ORAL | Status: DC
  Administered 2021-07-22 – 2021-07-26 (×5): 500 ug via ORAL
  Filled 2021-07-21 (×6): qty 1

## 2021-07-21 MED ORDER — SODIUM CHLORIDE FLUSH 0.9 % IV SOLN
INTRAVENOUS | Status: AC
  Filled 2021-07-21: qty 10

## 2021-07-21 MED ORDER — OXYCODONE HCL 5 MG/5ML PO SOLN: 5.0000 mg | Freq: Once | ORAL | Status: DC | PRN

## 2021-07-21 MED ORDER — PROMETHAZINE HCL 25 MG/ML IJ SOLN: 6.2500 mg | INTRAMUSCULAR | Status: DC | PRN

## 2021-07-21 MED ORDER — METHOCARBAMOL 500 MG PO TABS
500.0000 mg | ORAL_TABLET | Freq: Four times a day (QID) | ORAL | Status: DC | PRN
  Administered 2021-07-23: 500 mg via ORAL
  Filled 2021-07-21: qty 1

## 2021-07-21 MED ORDER — ACETAMINOPHEN 10 MG/ML IV SOLN
INTRAVENOUS | Status: AC
  Filled 2021-07-21: qty 100

## 2021-07-21 MED ORDER — ADULT MULTIVITAMIN W/MINERALS CH
1.0000 | ORAL_TABLET | Freq: Every day | ORAL | Status: DC
  Administered 2021-07-22 – 2021-07-26 (×5): 1 via ORAL
  Filled 2021-07-21 (×5): qty 1

## 2021-07-21 MED ORDER — IPRATROPIUM-ALBUTEROL 0.5-2.5 (3) MG/3ML IN SOLN: 3.0000 mL | Freq: Once | RESPIRATORY_TRACT | Status: AC

## 2021-07-21 MED ORDER — SENNA 8.6 MG PO TABS
1.0000 | ORAL_TABLET | Freq: Two times a day (BID) | ORAL | Status: DC
  Administered 2021-07-21 – 2021-07-26 (×6): 8.6 mg via ORAL
  Filled 2021-07-21 (×10): qty 1

## 2021-07-21 MED ORDER — KETAMINE HCL 10 MG/ML IJ SOLN
INTRAMUSCULAR | Status: DC | PRN
  Administered 2021-07-21: 30 mg via INTRAVENOUS
  Administered 2021-07-21: 20 mg via INTRAVENOUS

## 2021-07-21 MED ORDER — BISACODYL 10 MG RE SUPP: 10.0000 mg | Freq: Every day | RECTAL | Status: DC | PRN

## 2021-07-21 MED ORDER — LACTATED RINGERS IV SOLN
INTRAVENOUS | Status: DC | PRN
Start: 2021-07-21 — End: 2021-07-21

## 2021-07-21 MED ORDER — PREGABALIN 75 MG PO CAPS
75.0000 mg | ORAL_CAPSULE | Freq: Two times a day (BID) | ORAL | Status: DC
  Administered 2021-07-21 – 2021-07-26 (×10): 75 mg via ORAL
  Filled 2021-07-21 (×10): qty 1

## 2021-07-21 MED ORDER — ATORVASTATIN CALCIUM 20 MG PO TABS
40.0000 mg | ORAL_TABLET | Freq: Every day | ORAL | Status: DC
  Administered 2021-07-22 – 2021-07-26 (×5): 40 mg via ORAL
  Filled 2021-07-21 (×5): qty 2

## 2021-07-21 SURGICAL SUPPLY — 53 items
APL PRP STRL LF DISP 70% ISPRP (MISCELLANEOUS) ×1
BIT DRILL CALIBRATED 4.3MMX365 (DRILL) IMPLANT
BIT DRILL CROWE PNT TWST 4.5MM (DRILL) IMPLANT
BNDG ELASTIC 6X5.8 VLCR STR LF (GAUZE/BANDAGES/DRESSINGS) ×1 IMPLANT
CHLORAPREP W/TINT 26 (MISCELLANEOUS) ×2 IMPLANT
DRAPE C-ARM XRAY 36X54 (DRAPES) ×2 IMPLANT
DRAPE C-ARMOR (DRAPES) ×2 IMPLANT
DRILL CALIBRATED 4.3MMX365 (DRILL) ×2
DRILL CROWE POINT TWIST 4.5MM (DRILL) ×2
DRSG OPSITE POSTOP 3X4 (GAUZE/BANDAGES/DRESSINGS) ×1 IMPLANT
ELECT REM PT RETURN 9FT ADLT (ELECTROSURGICAL) ×2
ELECTRODE REM PT RTRN 9FT ADLT (ELECTROSURGICAL) ×1 IMPLANT
GAUZE 4X4 16PLY ~~LOC~~+RFID DBL (SPONGE) ×2 IMPLANT
GAUZE SPONGE 4X4 12PLY STRL (GAUZE/BANDAGES/DRESSINGS) ×2 IMPLANT
GAUZE XEROFORM 1X8 LF (GAUZE/BANDAGES/DRESSINGS) ×2 IMPLANT
GLOVE SURG SYN 9.0  PF PI (GLOVE) ×2
GLOVE SURG SYN 9.0 PF PI (GLOVE) ×1 IMPLANT
GLOVE SURG UNDER POLY LF SZ9 (GLOVE) ×2 IMPLANT
GOWN SRG 2XL LVL 4 RGLN SLV (GOWNS) ×1 IMPLANT
GOWN STRL NON-REIN 2XL LVL4 (GOWNS) ×2
GOWN STRL REUS W/ TWL LRG LVL3 (GOWN DISPOSABLE) ×1 IMPLANT
GOWN STRL REUS W/TWL LRG LVL3 (GOWN DISPOSABLE) ×2
GUIDE PIN 3.2MM (PIN) ×2
GUIDE PIN ORTH 12X3.2X (PIN) IMPLANT
GUIDEPIN VERSANAIL DSP 3.2X444 (ORTHOPEDIC DISPOSABLE SUPPLIES) ×1 IMPLANT
GUIDEWIRE BEAD TIP (WIRE) ×1 IMPLANT
HEMOVAC 400ML (MISCELLANEOUS) ×2
KIT DRAIN HEMOVAC JP 7FR 400ML (MISCELLANEOUS) ×1 IMPLANT
KIT TURNOVER KIT A (KITS) ×2 IMPLANT
MANIFOLD NEPTUNE II (INSTRUMENTS) ×2 IMPLANT
MAT ABSORB  FLUID 56X50 GRAY (MISCELLANEOUS) ×2
MAT ABSORB FLUID 56X50 GRAY (MISCELLANEOUS) ×1 IMPLANT
NAIL FEM RETRO 13.5X280 (Nail) ×1 IMPLANT
NDL FILTER BLUNT 18X1 1/2 (NEEDLE) ×1 IMPLANT
NEEDLE FILTER BLUNT 18X 1/2SAF (NEEDLE) ×1
NEEDLE FILTER BLUNT 18X1 1/2 (NEEDLE) ×1 IMPLANT
NS IRRIG 500ML POUR BTL (IV SOLUTION) ×2 IMPLANT
PACK HIP PROSTHESIS (MISCELLANEOUS) ×2 IMPLANT
PAD CAST CTTN 4X4 STRL (SOFTGOODS) IMPLANT
PADDING CAST COTTON 4X4 STRL (SOFTGOODS) ×2
SCALPEL PROTECTED #10 DISP (BLADE) ×4 IMPLANT
SCREW CORT TI DBL LEAD 5X30 (Screw) ×2 IMPLANT
SCREW CORT TI DBL LEAD 5X60 (Screw) ×1 IMPLANT
SCREW CORT TI DBL LEAD 5X65 (Screw) ×1 IMPLANT
SCREW CORT TI DBL LEAD 5X80 (Screw) ×1 IMPLANT
SPONGE T-LAP 18X18 ~~LOC~~+RFID (SPONGE) ×8 IMPLANT
STAPLER SKIN PROX 35W (STAPLE) ×2 IMPLANT
SUT VIC AB 0 CT1 36 (SUTURE) ×4 IMPLANT
SUT VIC AB 2-0 CT1 27 (SUTURE) ×4
SUT VIC AB 2-0 CT1 TAPERPNT 27 (SUTURE) ×2 IMPLANT
SYR 5ML LL (SYRINGE) ×2 IMPLANT
TAPE MICROFOAM 4IN (TAPE) ×2 IMPLANT
WATER STERILE IRR 500ML POUR (IV SOLUTION) ×2 IMPLANT

## 2021-07-21 NOTE — Transfer of Care (Signed)
Immediate Anesthesia Transfer of Care Note  Patient: Suzanne Avila  Procedure(s) Performed: INTRAMEDULLARY (IM) RETROGRADE FEMORAL NAILING (Right)  Patient Location: PACU  Anesthesia Type:General  Level of Consciousness: awake and drowsy  Airway & Oxygen Therapy: Patient Spontanous Breathing and Patient connected to face mask oxygen  Post-op Assessment: Report given to RN and Post -op Vital signs reviewed and stable  Post vital signs: Reviewed and stable  Last Vitals:  Vitals Value Taken Time  BP 129/59 07/21/21 1634  Temp 36.2 C 07/21/21 1635  Pulse 92 07/21/21 1639  Resp 15 07/21/21 1639  SpO2 100 % 07/21/21 1639  Vitals shown include unvalidated device data.  Last Pain:  Vitals:   07/21/21 1422  TempSrc: Tympanic  PainSc: 10-Worst pain ever      Patients Stated Pain Goal: 0 (07/21/21 1422)  Complications: No notable events documented.

## 2021-07-21 NOTE — Anesthesia Preprocedure Evaluation (Addendum)
Anesthesia Evaluation  Patient identified by MRN, date of birth, ID band Patient awake    Reviewed: Allergy & Precautions, NPO status , Patient's Chart, lab work & pertinent test results  Airway Mallampati: II  TM Distance: >3 FB Neck ROM: Full    Dental  (+) Chipped   Pulmonary COPD,  COPD inhaler, Current Smoker,     + wheezing      Cardiovascular Exercise Tolerance: Good hypertension, Pt. on medications + DOE  Normal cardiovascular exam+ dysrhythmias (PVCs and left anterior fascicular block)      Neuro/Psych PSYCHIATRIC DISORDERS Depression  Neuromuscular disease    GI/Hepatic Neg liver ROS, GERD  Medicated and Controlled,  Endo/Other    Renal/GU Renal disease (AKI)     Musculoskeletal  (+) Arthritis , Fibromyalgia -  Abdominal Normal abdominal exam  (+)   Peds  Hematology  (+) anemia ,   Anesthesia Other Findings 75 y.o. female with medical history significant for nicotine dependence, COPD, paroxysmal atrial fibrillation, hypertension, and dyslipidemia.  Chest x-ray reviewed by me shows chronic scarring in the right upper lobe at the site of prior cavitary lesion.  Aortic atherosclerosis  Twelve-lead EKG reviewed by me shows sinus tachycardia with PVCs and left anterior fascicular block  Reproductive/Obstetrics                            Anesthesia Physical Anesthesia Plan  ASA: 3  Anesthesia Plan: General   Post-op Pain Management:    Induction: Intravenous  PONV Risk Score and Plan: 2 and Ondansetron and Treatment may vary due to age or medical condition  Airway Management Planned: Oral ETT  Additional Equipment:   Intra-op Plan:   Post-operative Plan: Extubation in OR  Informed Consent: I have reviewed the patients History and Physical, chart, labs and discussed the procedure including the risks, benefits and alternatives for the proposed anesthesia with the patient or  authorized representative who has indicated his/her understanding and acceptance.     Dental advisory given  Plan Discussed with: Anesthesiologist and CRNA  Anesthesia Plan Comments: (Pre-op Nebulizor)        Anesthesia Quick Evaluation

## 2021-07-21 NOTE — Anesthesia Postprocedure Evaluation (Signed)
Anesthesia Post Note  Patient: Suzanne Avila  Procedure(s) Performed: INTRAMEDULLARY (IM) RETROGRADE FEMORAL NAILING (Right)  Patient location during evaluation: PACU Anesthesia Type: General Level of consciousness: awake and alert Pain management: pain level controlled Vital Signs Assessment: post-procedure vital signs reviewed and stable Respiratory status: spontaneous breathing, nonlabored ventilation and respiratory function stable Cardiovascular status: blood pressure returned to baseline and stable Postop Assessment: no apparent nausea or vomiting Anesthetic complications: no   No notable events documented.   Last Vitals:  Vitals:   07/21/21 1806 07/21/21 2030  BP: (!) 155/66 (!) 151/69  Pulse: 86 98  Resp: 16 20  Temp: 36.5 C 37.3 C  SpO2: 96% 96%    Last Pain:  Vitals:   07/21/21 2030  TempSrc: Oral  PainSc:                  Foye Deer

## 2021-07-21 NOTE — Op Note (Signed)
07/21/2021  4:28 PM  PATIENT:  Suzanne Avila  75 y.o. female  PRE-OPERATIVE DIAGNOSIS: Spiral right femur fracture comminuted  POST-OPERATIVE DIAGNOSIS: Same  PROCEDURE:  Procedure(s): INTRAMEDULLARY (IM) RETROGRADE FEMORAL NAILING (Right)  SURGEON: Leitha Schuller, MD  ASSISTANTS: None  ANESTHESIA:   general  EBL:  Total I/O In: 1950 [I.V.:1900; IV Piggyback:50] Out: 50 [Blood:50]  BLOOD ADMINISTERED:none  DRAINS: none   LOCAL MEDICATIONS USED:  NONE  SPECIMEN:  No Specimen  DISPOSITION OF SPECIMEN:  N/A  COUNTS:  YES  TOURNIQUET: None  IMPLANTS: [employer] [place] retrograde nail 13.5 mm diameter 280 mm in length with 3 distal interlocking screws 2 proximal interlocking screws  DICTATION: .Dragon Dictation patient was brought to the operating room and after adequate anesthesia was obtained she was transferred to the radiolucent table.  The right leg was prepped and draped you sterile fashion and a triangle used to help get indirect reduction.  C-arm was brought in and good visualization of the fracture was obtained.  After appropriate patient identification and timeout procedure a patellar tendon splitting approach was made to enter the knee and a guidewire inserted in appropriate position with reaming carried out starting at the notch.  A guidewire was inserted and passed across the fracture site into the proximal femur measurement made rod length determined.  The canal was quite wide and reaming was carried out to 14-1/2 mm for the 13/2 mm rod which was inserted to the appropriate depth.  Distal interlocking screws were placed using sterile sleeves making small incision spreading the tissue drilling placing the 5.0 cortical screws getting both sides the distal cortex.  After these 3 screws have been placed and verified in AP and lateral projections 2 proximal interlocking screws were placed with the 1 through the oblique hole first getting both cortices the more distal  hole only got 1 cortex and would not pass through the posterior cortex.  This was left in place the wounds were irrigated and the wounds closed with staples for the percutaneous screws #1 Vicryl running for the patellar tendon 2-0 Vicryl subcutaneously and skin staples honeycomb dressings applied to the screws incisions there is a skin tear just medial to the skin incision this was covered with Xeroform as was the patellar tendon and approach incision along with 4 x 4's ABD web roll and an Ace wrap from the foot to the thigh patient tolerated procedure well.  PLAN OF CARE: Admit to inpatient   PATIENT DISPOSITION:  PACU - hemodynamically stable.

## 2021-07-21 NOTE — Consult Note (Signed)
Reason for Consult: Right femur fracture Referring Physician: Dr. Little Ishikawa Suzanne Avila is an 75 y.o. female.  HPI: Patient went to the bathroom through the middle the night when she is trying to get up she fell injuring her right thigh and was unable to stand after this.  She is brought to the emergency room where she is found to have a spiral femoral shaft fracture with comminution.  She is being admitted for treatment of this she normally is walks without assistance and has help at home with her husband and son  Past Medical History:  Diagnosis Date   Closed left arm fracture    COPD (chronic obstructive pulmonary disease) (HCC)    Dehydration 09/15/2020   Glaucoma    Hyperlipidemia    Hypertension    Lung mass     Past Surgical History:  Procedure Laterality Date   BACK SURGERY     CARPAL TUNNEL RELEASE Left    EYE SURGERY      Family History  Problem Relation Age of Onset   Cancer Mother    Cancer Father     Social History:  reports that she has been smoking cigarettes. She has a 75.00 pack-year smoking history. She has never used smokeless tobacco. She reports current alcohol use of about 35.0 standard drinks per week. She reports that she does not use drugs.  Allergies:  Allergies  Allergen Reactions   Codeine Itching   Cyclobenzaprine Itching   Penicillins     Itching without rash per patient.  Unable to say when it happened, other than "a long time ago"    Medications: I have reviewed the patient's current medications.  Results for orders placed or performed during the hospital encounter of 07/21/21 (from the past 48 hour(s))  CBC with Differential/Platelet     Status: Abnormal   Collection Time: 07/21/21  5:53 AM  Result Value Ref Range   WBC 10.8 (H) 4.0 - 10.5 K/uL   RBC 3.76 (L) 3.87 - 5.11 MIL/uL   Hemoglobin 10.3 (L) 12.0 - 15.0 g/dL   HCT 53.6 (L) 64.4 - 03.4 %   MCV 81.1 80.0 - 100.0 fL   MCH 27.4 26.0 - 34.0 pg   MCHC 33.8 30.0 - 36.0 g/dL    RDW 74.2 (H) 59.5 - 15.5 %   Platelets 289 150 - 400 K/uL   nRBC 0.0 0.0 - 0.2 %   Neutrophils Relative % 78 %   Neutro Abs 8.5 (H) 1.7 - 7.7 K/uL   Lymphocytes Relative 9 %   Lymphs Abs 1.0 0.7 - 4.0 K/uL   Monocytes Relative 11 %   Monocytes Absolute 1.2 (H) 0.1 - 1.0 K/uL   Eosinophils Relative 1 %   Eosinophils Absolute 0.1 0.0 - 0.5 K/uL   Basophils Relative 1 %   Basophils Absolute 0.1 0.0 - 0.1 K/uL   WBC Morphology MORPHOLOGY UNREMARKABLE    RBC Morphology MORPHOLOGY UNREMARKABLE    Smear Review Normal platelet morphology    Immature Granulocytes 0 %   Abs Immature Granulocytes 0.04 0.00 - 0.07 K/uL    Comment: Performed at [facility], [address], [place], [place] [address]  Basic metabolic panel     Status: Abnormal   Collection Time: 07/21/21  5:53 AM  Result Value Ref Range   Sodium 130 (L) 135 - 145 mmol/L   Potassium 5.2 (H) 3.5 - 5.1 mmol/L   Chloride 94 (L) 98 - 111 mmol/L   CO2  22 22 - 32 mmol/L   Glucose, Bld 103 (H) 70 - 99 mg/dL    Comment: Glucose reference range applies only to samples taken after fasting for at least 8 hours.   BUN 38 (H) 8 - 23 mg/dL   Creatinine, Ser 3.71 (H) 0.44 - 1.00 mg/dL   Calcium 8.7 (L) 8.9 - 10.3 mg/dL   GFR, Estimated 37 (L) >60 mL/min    Comment: (NOTE) Calculated using the CKD-EPI Creatinine Equation (2021)    Anion gap 14 5 - 15    Comment: Performed at [facility], [address] [place], [place] [address]  Type and screen [facility]     Status: None (Preliminary result)   Collection Time: 07/21/21  5:53 AM  Result Value Ref Range   ABO/RH(D) PENDING    Antibody Screen PENDING    Sample Expiration      07/24/2021,2359 Performed at [facility], [address] [place], [place] [address]   Protime-INR     Status: None   Collection Time: 07/21/21  5:53 AM  Result Value Ref Range   Prothrombin Time 12.9 11.4 - 15.2 seconds   INR 1.0 0.8 - 1.2    Comment:  (NOTE) INR goal varies based on device and disease states. Performed at [facility], [address] [place], [place] [address]   Resp Panel by RT-PCR (Flu A&B, Covid) Nasopharyngeal Swab     Status: None   Collection Time: 07/21/21  5:53 AM   Specimen: Nasopharyngeal Swab; Nasopharyngeal(NP) swabs in vial transport medium  Result Value Ref Range   SARS Coronavirus 2 by RT PCR NEGATIVE NEGATIVE    Comment: (NOTE) SARS-CoV-2 target nucleic acids are NOT DETECTED.  The SARS-CoV-2 RNA is generally detectable in upper respiratory specimens during the acute phase of infection. The lowest concentration of SARS-CoV-2 viral copies this assay can detect is 138 copies/mL. A negative result does not preclude SARS-Cov-2 infection and should not be used as the sole basis for treatment or other patient management decisions. A negative result may occur with  improper specimen collection/handling, submission of specimen other than nasopharyngeal swab, presence of viral mutation(s) within the areas targeted by this assay, and inadequate number of viral copies(<138 copies/mL). A negative result must be combined with clinical observations, patient history, and epidemiological information. The expected result is Negative.  Fact Sheet for Patients:  BloggerCourse.com  Fact Sheet for Healthcare Providers:  SeriousBroker.it  This test is no t yet approved or cleared by the [place] FDA and  has been authorized for detection and/or diagnosis of SARS-CoV-2 by FDA under an Emergency Use Authorization (EUA). This EUA will remain  in effect (meaning this test can be used) for the duration of the COVID-19 declaration under Section 564(b)(1) of the Act, 21 U.S.C.section 360bbb-3(b)(1), unless the authorization is terminated  or revoked sooner.       Influenza A by PCR NEGATIVE NEGATIVE   Influenza B by PCR NEGATIVE NEGATIVE    Comment:  (NOTE) The Xpert Xpress SARS-CoV-2/FLU/RSV plus assay is intended as an aid in the diagnosis of influenza from Nasopharyngeal swab specimens and should not be used as a sole basis for treatment. Nasal washings and aspirates are unacceptable for Xpert Xpress SARS-CoV-2/FLU/RSV testing.  Fact Sheet for Patients: BloggerCourse.com  Fact Sheet for Healthcare Providers: SeriousBroker.it  This test is not yet approved or cleared by the [place] FDA and has been authorized for detection and/or diagnosis of SARS-CoV-2 by FDA  under an Emergency Use Authorization (EUA). This EUA will remain in effect (meaning this test can be used) for the duration of the COVID-19 declaration under Section 564(b)(1) of the Act, 21 U.S.C. section 360bbb-3(b)(1), unless the authorization is terminated or revoked.  Performed at [facility], [address], [place], [place] [address]     DG Chest Portable 1 View  Result Date: 07/21/2021 CLINICAL DATA:  75 year old female under preoperative evaluation. History of COPD. EXAM: PORTABLE CHEST 1 VIEW COMPARISON:  Chest x-ray 07/15/2020. FINDINGS: Linear area of scarring in the right upper lobe at the site of the previously noted large cavitary lesion. Nodular density projecting over the lateral aspect of the left lower lung, favored to represent a prominent nipple shadow. Lungs are otherwise clear. Skin fold artifact in the lower left hemithorax. No definite pneumothorax. No evidence of pulmonary edema. No pleural effusions. Heart size is normal. Upper mediastinal contours are within normal limits. Atherosclerotic calcifications in the thoracic aorta. IMPRESSION: 1. No radiographic evidence of acute cardiopulmonary disease. 2. Chronic scarring in the right upper lobe at site of prior cavitary lesion. 3. Probable nipple shadow projecting over the lower left lung. Follow-up standing PA and lateral chest  radiograph with nipple markers in place is recommended in the near future to better evaluate this finding. 4. Aortic atherosclerosis. Electronically Signed   By: Trudie Reed M.D.   On: 07/21/2021 07:03   DG Hip Unilat W or Wo Pelvis 2-3 Views Right  Result Date: 07/21/2021 CLINICAL DATA:  75 year old female with history of trauma from a fall with history of right-sided hip pain. EXAM: DG HIP (WITH OR WITHOUT PELVIS) 2-3V RIGHT COMPARISON:  No priors. FINDINGS: AP view of the bony pelvis and AP and lateral views of the right hip demonstrate no acute displaced fracture of the bony pelvic ring. Right femoral head is located. Proximal right femur is intact (distal femoral fracture described separately). Areas of sclerosis are noted in the femoral heads bilaterally, suggesting avascular necrosis. There is also joint space narrowing, subchondral sclerosis, subchondral cyst formation and osteophyte formation in the hip joints bilaterally indicative of moderate bilateral hip joint osteoarthritis. Orthopedic fixation hardware in the lumbosacral spine incompletely imaged. IMPRESSION: 1. No acute radiographic abnormality of the bony pelvic ring or right hip. 2. Moderate bilateral hip joint osteoarthritis. 3. 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 4. Distal displaced and comminuted femoral diaphyseal fracture separately described. Electronically Signed   By: Trudie Reed M.D.   On: 07/21/2021 07:01   DG Femur Portable Min 2 Views Right  Result Date: 07/21/2021 CLINICAL DATA:  75 year old female with history of trauma from a fall. Right-sided leg pain. EXAM: RIGHT FEMUR PORTABLE 2 VIEW COMPARISON:  No priors. FINDINGS: Multiple views of the right femur demonstrate an acute displaced comminuted spiral fracture of the distal third of the femoral diaphysis with approximately one shaft width of posterior displacement of the distal fracture fragment. Large triangular-shaped  separate fracture fragment also noted. Numerous vascular calcifications are noted. IMPRESSION: 1. Acute displaced comminuted spiral fracture of the distal third of the femoral diaphysis, as above. 2. Atherosclerosis. Electronically Signed   By: Trudie Reed M.D.   On: 07/21/2021 06:59    Review of Systems Blood pressure 130/78, pulse 82, temperature 98 F (36.7 C), temperature source Oral, resp. rate 14, height 4\' 8"  (1.422 m), weight 42 kg, SpO2 96 %. Physical Exam Right leg is in traction splint from EMS.  She is able to flex  and her toes and has trace dorsalis pedis pulse.  There is swelling to the thigh and abrasion with skin tear over the anterior medial distal thigh secondary to fall.  She has swelling to her thigh and the leg is markedly externally rotated Assessment/Plan: Spiral comminuted femoral shaft fracture right.  Recommendation is for open reduction internal fixation.  With her bone quality and fracture pattern I recommend retrograde rodding.  Discussed risk benefits possible complications.  Hope she will do that later today.  Kennedy Bucker 07/21/2021, 8:18 AM

## 2021-07-21 NOTE — TOC Initial Note (Signed)
Transition of Care [facility]) - Initial/Assessment Note    Patient Details  Name: Suzanne Avila MRN: [id number] Date of Birth: [birth year]  [facility]) CM/SW Contact:    Marina Goodell Phone Number: [phone] 07/21/2021, 11:32 AM  Clinical Narrative:                  Patient presents to [facility] due to mechanical fall. Patient dx distal displaced and comminuted femoral diaphyseal fracture. Patient's main contact is spouse Suzanne Avila [phone].  CSW left voicemail for Mr. Birenbaum requesting a return call for collateral information.  Patient is able to manage all ADLs independently.  Patient has hx of alcohol use averaging 35 drinks per week, and tobacco use averaging 75.00 pack per year.    Suzanne Avila [id number] [id number]  Expected Discharge Plan: Skilled Nursing Facility Barriers to Discharge: Continued Medical Work up, SNF Pending bed offer   Patient Goals and CMS Choice        Expected Discharge Plan and Services Expected Discharge Plan: Skilled Nursing Facility       Living arrangements for the past 2 months: Single Family Home                                      Prior Living Arrangements/Services Living arrangements for the past 2 months: Single Family Home Lives with:: Spouse Patient language and need for interpreter reviewed:: Yes Do you feel safe going back to the place where you live?: Yes      Need for Family Participation in Patient Care: Yes (Comment) Care giver support system in place?: Yes (comment)   Criminal Activity/Legal Involvement Pertinent to Current Situation/Hospitalization: No - Comment as needed  Activities of Daily Living      Permission Sought/Granted Permission sought to share information with : Facility Medical sales representative    Share Information with NAME: Suzanne Avila, Suzanne Avila (Spouse)   [phone] (Home Phone)           Emotional Assessment Appearance:: Appears stated age Attitude/Demeanor/Rapport:  Engaged Affect (typically observed): Stable Orientation: : Oriented to Self, Oriented to Place, Oriented to  Time, Oriented to Situation Alcohol / Substance Use: Not Applicable Psych Involvement: No (comment)  Admission diagnosis:  Femur fracture, right (HCC) [S72.91XA] Patient Active Problem List   Diagnosis Date Noted   Femur fracture, right (HCC) 07/21/2021   Fall at home, initial encounter    AKI (acute kidney injury) (HCC)    Depression    Abnormal CT scan, lumbar spine 07/06/2021   Lumbar foraminal stenosis (Bilateral: L2-3 and L3-4) (L>R) 07/06/2021   Lumbosacral lateral recess stenosis (Bilateral: L3-4) (L>R) 07/06/2021   Chronic hip pain (Bilateral) (L>R) 06/22/2021   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 06/22/2021   Lumbosacral radiculopathy at L2 (Left) 06/01/2021   Impaired ambulation 06/01/2021   Underweight 05/14/2021   Chronic use of opiate for therapeutic purpose 01/26/2021   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   Cavitary lesion of lung 09/15/2020   AF (paroxysmal atrial fibrillation) (HCC) 09/15/2020   Failure to thrive in adult 09/15/2020   Severe protein-calorie malnutrition [name]: less than 60% of standard weight) (HCC) 09/15/2020   Dysphagia 09/15/2020   Hypotension 09/15/2020   Abscess of right lung with pneumonia (HCC) 09/14/2020   Chronic low back pain (Left) w/o sciatica 09/10/2020  Pharmacologic therapy 05/05/2020   Chronic diarrhea 05/04/2020   DDD (degenerative disc disease), lumbosacral 11/19/2019   Therapeutic opioid-induced constipation (OIC) 08/01/2019   Chronic sacroiliac joint pain (Left) 05/01/2019   Chronic groin pain (Left) 05/01/2019   Spondylosis without myelopathy or radiculopathy, lumbosacral region 05/01/2019   Other specified dorsopathies, sacral and sacrococcygeal region 05/01/2019   Renal artery stenosis (HCC)  12/27/2017   Pain in limb 12/22/2017   Tobacco use disorder 12/22/2017   Renal atrophy, right 12/22/2017   Thoracic radiculitis 01/31/2017   Chronic lower extremity pain (1ry area of Pain) (Bilateral) (L>R) 04/25/2016   Chronic upper extremity pain (3ry area of Pain) (Bilateral) (L>R) 04/25/2016   Disturbance of skin sensation 04/20/2016   Neurogenic pain 04/20/2016   Vitamin D deficiency 11/11/2015   Edema 09/02/2015   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   Long term current use of opiate analgesic 08/14/2015   Long term prescription opiate use 08/14/2015   Uncomplicated opioid dependence (HCC) 08/14/2015   Opiate use (30 MME/Day) 08/14/2015   Chronic low back pain (2ry area of Pain) (Bilateral) (L>R) w/ sciatica (Bilateral) 08/14/2015   Chronic pain syndrome 08/14/2015   Alcoholic (HCC) 08/14/2015   B12 deficiency 08/14/2015   Narrowing of intervertebral disc space 08/14/2015   History of metabolic disorder 08/14/2015   Fungal infection of toenail 08/14/2015   Osteopenia 08/14/2015   Compulsive tobacco user syndrome 08/14/2015   Osteoarthritis of hip (Left) 08/14/2015   Chronic hip pain (Left) 08/14/2015   Chronic lumbar radicular pain (L5 dermatome) (Bilateral) (L>R) 08/14/2015   Lumbar facet syndrome (Bilateral) (L>R) 08/14/2015   Lumbar spondylosis 08/14/2015   Lumbar Postlaminectomy syndrome (L4-5) 08/14/2015   Fibromyalgia 08/14/2015   COPD (chronic obstructive pulmonary disease) (HCC) 04/21/2015   Allergic rhinitis 02/10/2010   Atrial paroxysmal tachycardia (HCC) 07/06/2009   CAFL (chronic airflow limitation) (HCC) 07/06/2009   Acid reflux 07/06/2009   Glaucoma 07/06/2009   Essential hypertension 07/06/2009   Hypercholesterolemia without hypertriglyceridemia 07/06/2009   Absence of bladder continence 07/06/2009   PCP:  Erasmo Downer, MD Pharmacy:   [facility] [place], [place] - [address] [place]  [address] Phone: [phone] Fax: [phone]  [facility] - [place], [place] - [address] [place] [address] Phone: [phone] Fax: [phone]     Social Determinants of Health (SDOH) Interventions    Readmission Risk Interventions Readmission Risk Prevention Plan 09/16/2020  Transportation Screening Complete  PCP or Specialist Appt within 3-5 Days Complete  HRI or Home Care Consult Complete  Social Work Consult for Recovery Care Planning/Counseling Complete  Palliative Care Screening Not Applicable  Medication Review Oceanographer) Complete  Some recent data might be hidden

## 2021-07-21 NOTE — ED Triage Notes (Signed)
Pt arrived via [facility] from home where she had a mechanical fall while putting on pants this AM, landing on solid floor on the right leg/hip. Pt with obvious deformity to the right leg at femur with shortening noted. Pt received Fentanyl in route. Pt denies hitting head and LOC.

## 2021-07-21 NOTE — ED Notes (Signed)
Patient transported to X-ray 

## 2021-07-21 NOTE — ED Notes (Signed)
Dr Joylene Igo was just at bedside with pt.  Orthopedic MD now at bedside.

## 2021-07-21 NOTE — [place] FL2 (Addendum)
[place] MEDICAID FL2 LEVEL OF CARE SCREENING TOOL     IDENTIFICATION  Patient Name: Suzanne Avila Birthdate: 10/26/1945 Sex: female Admission Date (Current Location): 07/21/2021  [facility] and [place] Number:  Chiropodist and Address:  [facility], [address] [place], [place] [address]      Provider Number: [id number]  Attending Physician Name and Address:  Lucile Shutters, MD  Relative Name and Phone Number:  Janaa, Acero (Spouse)   [phone] (Home Phone)    Current Level of Care: Hospital Recommended Level of Care: Skilled Nursing Facility Prior Approval Number:    Date Approved/Denied:   PASRR Number: [id number]  Discharge Plan: SNF    Current Diagnoses: Patient Active Problem List   Diagnosis Date Noted   Femur fracture, right (HCC) 07/21/2021   Fall at home, initial encounter    AKI (acute kidney injury) (HCC)    Depression    Abnormal CT scan, lumbar spine 07/06/2021   Lumbar foraminal stenosis (Bilateral: L2-3 and L3-4) (L>R) 07/06/2021   Lumbosacral lateral recess stenosis (Bilateral: L3-4) (L>R) 07/06/2021   Chronic hip pain (Bilateral) (L>R) 06/22/2021   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 06/22/2021   Lumbosacral radiculopathy at L2 (Left) 06/01/2021   Impaired ambulation 06/01/2021   Underweight 05/14/2021   Chronic use of opiate for therapeutic purpose 01/26/2021   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   Cavitary lesion of lung 09/15/2020   AF (paroxysmal atrial fibrillation) (HCC) 09/15/2020   Failure to thrive in adult 09/15/2020   Severe protein-calorie malnutrition [name]: less than 60% of standard weight) (HCC) 09/15/2020   Dysphagia 09/15/2020   Hypotension 09/15/2020   Abscess of right lung with pneumonia (HCC) 09/14/2020   Chronic low back pain (Left) w/o sciatica  09/10/2020   Pharmacologic therapy 05/05/2020   Chronic diarrhea 05/04/2020   DDD (degenerative disc disease), lumbosacral 11/19/2019   Therapeutic opioid-induced constipation (OIC) 08/01/2019   Chronic sacroiliac joint pain (Left) 05/01/2019   Chronic groin pain (Left) 05/01/2019   Spondylosis without myelopathy or radiculopathy, lumbosacral region 05/01/2019   Other specified dorsopathies, sacral and sacrococcygeal region 05/01/2019   Renal artery stenosis (HCC) 12/27/2017   Pain in limb 12/22/2017   Tobacco use disorder 12/22/2017   Renal atrophy, right 12/22/2017   Thoracic radiculitis 01/31/2017   Chronic lower extremity pain (1ry area of Pain) (Bilateral) (L>R) 04/25/2016   Chronic upper extremity pain (3ry area of Pain) (Bilateral) (L>R) 04/25/2016   Disturbance of skin sensation 04/20/2016   Neurogenic pain 04/20/2016   Vitamin D deficiency 11/11/2015   Edema 09/02/2015   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   Long term current use of opiate analgesic 08/14/2015   Long term prescription opiate use 08/14/2015   Uncomplicated opioid dependence (HCC) 08/14/2015   Opiate use (30 MME/Day) 08/14/2015   Chronic low back pain (2ry area of Pain) (Bilateral) (L>R) w/ sciatica (Bilateral) 08/14/2015   Chronic pain syndrome 08/14/2015   Alcoholic (HCC) 08/14/2015   B12 deficiency 08/14/2015   Narrowing of intervertebral disc space 08/14/2015   History of metabolic disorder 08/14/2015   Fungal infection of toenail 08/14/2015   Osteopenia 08/14/2015   Compulsive tobacco user syndrome 08/14/2015   Osteoarthritis of hip (Left) 08/14/2015   Chronic hip pain (Left) 08/14/2015   Chronic lumbar radicular pain (L5 dermatome) (Bilateral) (L>R) 08/14/2015   Lumbar facet syndrome (Bilateral) (L>R) 08/14/2015  Lumbar spondylosis 08/14/2015   Lumbar Postlaminectomy syndrome (L4-5) 08/14/2015   Fibromyalgia 08/14/2015   COPD (chronic obstructive pulmonary disease) (HCC) 04/21/2015    Allergic rhinitis 02/10/2010   Atrial paroxysmal tachycardia (HCC) 07/06/2009   CAFL (chronic airflow limitation) (HCC) 07/06/2009   Acid reflux 07/06/2009   Glaucoma 07/06/2009   Essential hypertension 07/06/2009   Hypercholesterolemia without hypertriglyceridemia 07/06/2009   Absence of bladder continence 07/06/2009    Orientation RESPIRATION BLADDER Height & Weight     Self, Time, Situation, Place  Normal Continent Weight: 92 lb 9.5 oz (42 kg) Height:  4\' 8"  (142.2 cm)  BEHAVIORAL SYMPTOMS/MOOD NEUROLOGICAL BOWEL NUTRITION STATUS      Continent  Diet  AMBULATORY STATUS COMMUNICATION OF NEEDS Skin   Extensive Assist Verbally Normal                       Personal Care Assistance Level of Assistance  Bathing, Feeding, Dressing, Total care Bathing Assistance: Limited assistance Feeding assistance: Independent Dressing Assistance: Limited assistance Total Care Assistance: Limited assistance   Functional Limitations Info  Sight, Speech, Hearing Sight Info: Adequate Hearing Info: Adequate Speech Info: Adequate    SPECIAL CARE FACTORS FREQUENCY  PT (By licensed PT), OT (By licensed OT)     PT Frequency: 5X per week OT Frequency: 5X per week            Contractures Contractures Info: Present    Additional Factors Info          Moderna COVID-19 Vaccine 11/03/2020 , 01/30/2020 , 01/02/2020           Current Medications (07/21/2021):  This is the current hospital active medication list Current Facility-Administered Medications  Medication Dose Route Frequency Provider Last Rate Last Admin   0.9 %  sodium chloride infusion   Intravenous Continuous Ward, Kristen N, DO   Stopped at 07/21/21 0941   0.9 %  sodium chloride infusion   Intravenous Continuous Agbata, Tochukwu, MD 50 mL/hr at 07/21/21 0941 New Bag at 07/21/21 0941   albuterol (PROVENTIL) (2.5 MG/3ML) 0.083% nebulizer solution 2.5 mg  2.5 mg Nebulization Q6H PRN Agbata, Tochukwu, MD       arformoterol  (BROVANA) nebulizer solution 15 mcg  15 mcg Nebulization BID Agbata, Tochukwu, MD       And   umeclidinium bromide (INCRUSE ELLIPTA) 62.5 MCG/INH 1 puff  1 puff Inhalation Daily Agbata, Tochukwu, MD       atorvastatin (LIPITOR) tablet 40 mg  40 mg Oral Daily Agbata, Tochukwu, MD       ceFAZolin (ANCEF) powder 1 g  1 g Other To OR 07/23/21, MD       diltiazem (CARDIZEM) tablet 120 mg  120 mg Oral Daily Agbata, Tochukwu, MD       dorzolamide (TRUSOPT) 2 % ophthalmic solution 1 drop  1 drop Both Eyes BID Agbata, Tochukwu, MD       fentaNYL (SUBLIMAZE) injection 50 mcg  50 mcg Intravenous Q2H PRN Ward, Kristen N, DO   50 mcg at 07/21/21 07/23/21   fluconazole (DIFLUCAN) tablet 100 mg  100 mg Oral Daily Agbata, Tochukwu, MD       HYDROcodone-acetaminophen (NORCO/VICODIN) 5-325 MG per tablet 1-2 tablet  1-2 tablet Oral Q6H PRN Agbata, Tochukwu, MD       latanoprost (XALATAN) 0.005 % ophthalmic solution 1 drop  1 drop Both Eyes QHS Agbata, Tochukwu, MD       magnesium gluconate (MAGONATE) tablet 500 mg  500  mg Oral Daily Agbata, Tochukwu, MD       mirtazapine (REMERON) tablet 7.5 mg  7.5 mg Oral QHS Agbata, Tochukwu, MD       morphine 2 MG/ML injection 0.5 mg  0.5 mg Intravenous Q2H PRN Agbata, Tochukwu, MD       multivitamin with minerals tablet 1 tablet  1 tablet Oral Daily Agbata, Tochukwu, MD       ondansetron (ZOFRAN) injection 4 mg  4 mg Intravenous Q6H PRN Ward, Kristen N, DO   4 mg at 07/21/21 2831   pantoprazole (PROTONIX) EC tablet 40 mg  40 mg Oral Daily Agbata, Tochukwu, MD       pregabalin (LYRICA) capsule 75 mg  75 mg Oral BID Agbata, Tochukwu, MD       rOPINIRole (REQUIP) tablet 2 mg  2 mg Oral BID Agbata, Tochukwu, MD       senna (SENOKOT) tablet 8.6 mg  1 tablet Oral BID Agbata, Tochukwu, MD       tiZANidine (ZANAFLEX) tablet 2 mg  2 mg Oral TID PRN Agbata, Tochukwu, MD       vitamin B-12 (CYANOCOBALAMIN) tablet 500 mcg  500 mcg Oral Daily Agbata, Tochukwu, MD       Current  Outpatient Medications  Medication Sig Dispense Refill   albuterol (PROVENTIL) (2.5 MG/3ML) 0.083% nebulizer solution Take 3 mLs (2.5 mg total) by nebulization every 6 (six) hours as needed for wheezing or shortness of breath. 150 mL 1   albuterol (VENTOLIN HFA) 108 (90 Base) MCG/ACT inhaler Inhale 2 puffs into the lungs every 6 (six) hours as needed for wheezing or shortness of breath. 18 g 6   atorvastatin (LIPITOR) 40 MG tablet Take 1 tablet (40 mg total) by mouth daily. 90 tablet 3   diltiazem (CARDIZEM) 120 MG tablet Take 1 tablet (120 mg total) by mouth daily. 90 tablet 2   dorzolamide (TRUSOPT) 2 % ophthalmic solution Place 1 drop into both eyes 2 (two) times daily. 10 mL 5   fluconazole (DIFLUCAN) 100 MG tablet Take 1 tablet (100 mg total) by mouth daily for 14 days. 14 tablet 0   HYDROcodone-acetaminophen (NORCO) 7.5-325 MG tablet Take 1 tablet by mouth every 6 (six) hours as needed for severe pain. Must last 30 days 120 tablet 0   latanoprost (XALATAN) 0.005 % ophthalmic solution Place 1 drop into both eyes at bedtime. 2.5 mL 3   magnesium gluconate (MAGONATE) 500 MG tablet Take 500 mg by mouth daily.     meloxicam (MOBIC) 15 MG tablet Take 1 tablet (15 mg total) by mouth daily. 90 tablet 1   mirtazapine (REMERON) 7.5 MG tablet Take 1 tablet (7.5 mg total) by mouth at bedtime. 90 tablet 2   Multiple Vitamin (MULTI-VITAMIN) tablet Take 1 tablet by mouth daily.      omeprazole (PRILOSEC) 40 MG capsule Take 1 capsule (40 mg total) by mouth daily. 90 capsule 3   pregabalin (LYRICA) 150 MG capsule Take 1 capsule (150 mg total) by mouth every 8 (eight) hours. 270 capsule 1   ramipril (ALTACE) 2.5 MG capsule Take 1 capsule (2.5 mg total) by mouth daily. 90 capsule 3   rOPINIRole (REQUIP) 2 MG tablet Take 1 tablet (2 mg total) by mouth 2 (two) times daily. (Patient taking differently: Take 2 mg by mouth at bedtime.) 180 tablet 2   spironolactone (ALDACTONE) 25 MG tablet Take 1 tablet (25 mg  total) by mouth daily. 90 tablet 2   Tiotropium Bromide-Olodaterol (STIOLTO  RESPIMAT) 2.5-2.5 MCG/ACT AERS Inhale 2 puffs into the lungs daily. 1 each 0   tizanidine (ZANAFLEX) 2 MG capsule Take 1 capsule (2 mg total) by mouth 3 (three) times daily as needed for muscle spasms. 90 capsule 1   torsemide (DEMADEX) 20 MG tablet TAKE 2 TABLETS TWICE A DAY 360 tablet 1   vitamin B-12 (CYANOCOBALAMIN) 500 MCG tablet Take 1 tablet (500 mcg total) by mouth daily. 90 tablet 3   [START ON 08/02/2021] HYDROcodone-acetaminophen (NORCO) 7.5-325 MG tablet Take 1 tablet by mouth every 6 (six) hours as needed for severe pain. Must last 30 days 120 tablet 0   [START ON 09/01/2021] HYDROcodone-acetaminophen (NORCO) 7.5-325 MG tablet Take 1 tablet by mouth every 6 (six) hours as needed for severe pain. Must last 30 days 120 tablet 0     Discharge Medications: Please see discharge summary for a list of discharge medications.  Relevant Imaging Results:  Relevant Lab Results:   Additional Information [id number]  Joseph Art, LCSWA

## 2021-07-21 NOTE — ED Provider Notes (Signed)
Suzanne Avila ____________________________________________   Event Date/Time   First MD Initiated Contact with Patient 07/21/21 [id number]     (approximate)  I have reviewed the triage vital signs and the nursing notes.   HISTORY  Chief Complaint Fall    HPI Suzanne Avila is a 75 y.o. female with history of hypertension, hyperlipidemia, COPD, paroxysmal atrial fibrillation who presents to the emergency department with EMS after she had a fall at home just prior to arrival.  States that she was getting up from bed and putting her pants on when she lost her balance and fell.  EMS reports obvious deformity to the right mid femur.  They placed her in traction.  She denies hitting her head or losing consciousness.  She is on full dose aspirin but no anticoagulation.  She denies neck or back pain, numbness or tingling.  Denies any preceding symptoms that led to her fall including chest pain, shortness of breath, dizziness.  Received a total of 150 mcg of fentanyl with EMS.  Has an abrasion to the right knee.  Unsure of her last tetanus vaccination.         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 06/22/2021   Lumbosacral radiculopathy at L2 (Left) 06/01/2021   Impaired ambulation 06/01/2021   Underweight 05/14/2021   Chronic use of opiate for therapeutic purpose 01/26/2021   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   Cavitary lesion of lung 09/15/2020   AF (paroxysmal atrial fibrillation) (HCC) 09/15/2020   Failure to thrive in adult 09/15/2020   Severe protein-calorie malnutrition [name]: less than 60% of standard weight) (HCC) 09/15/2020   Dysphagia 09/15/2020   Hypotension 09/15/2020   Abscess of right lung with pneumonia (HCC) 09/14/2020   Chronic low back pain (Left) w/o sciatica 09/10/2020   Pharmacologic therapy 05/05/2020   Chronic diarrhea 05/04/2020   DDD (degenerative disc disease), lumbosacral 11/19/2019   Therapeutic opioid-induced constipation (OIC) 08/01/2019   Chronic sacroiliac joint pain (Left) 05/01/2019   Chronic groin pain (Left) 05/01/2019   Spondylosis without myelopathy or radiculopathy, lumbosacral region 05/01/2019   Other specified dorsopathies, sacral and sacrococcygeal region 05/01/2019   Renal artery stenosis (HCC) 12/27/2017   Pain in limb 12/22/2017   Tobacco use disorder 12/22/2017   Renal atrophy, right 12/22/2017   Thoracic radiculitis 01/31/2017   Chronic lower extremity pain (1ry area of Pain) (Bilateral) (L>R) 04/25/2016   Chronic upper extremity pain (3ry area of Pain) (Bilateral) (L>R) 04/25/2016   Disturbance of skin sensation 04/20/2016   Neurogenic pain 04/20/2016   Vitamin D deficiency 11/11/2015   Edema 09/02/2015   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   Long term current use of opiate analgesic 08/14/2015   Long term prescription opiate use 08/14/2015   Uncomplicated opioid dependence (HCC) 08/14/2015   Opiate use (30  MME/Day) 08/14/2015   Chronic low back pain (2ry area of Pain) (Bilateral) (L>R) w/ sciatica (Bilateral) 08/14/2015   Chronic pain syndrome 08/14/2015   Alcoholic (HCC) 08/14/2015   B12 deficiency 08/14/2015   Narrowing of intervertebral disc space 08/14/2015   History of metabolic disorder 08/14/2015   Fungal infection  of toenail 08/14/2015   Osteopenia 08/14/2015   Compulsive tobacco user syndrome 08/14/2015   Osteoarthritis of hip (Left) 08/14/2015   Chronic hip pain (Left) 08/14/2015   Chronic lumbar radicular pain (L5 dermatome) (Bilateral) (L>R) 08/14/2015   Lumbar facet syndrome (Bilateral) (L>R) 08/14/2015   Lumbar spondylosis 08/14/2015   Lumbar Postlaminectomy syndrome (L4-5) 08/14/2015   Fibromyalgia 08/14/2015   Chronic airway obstruction (HCC) 04/21/2015   Allergic rhinitis 02/10/2010   Atrial paroxysmal tachycardia (HCC) 07/06/2009   CAFL (chronic airflow limitation) (HCC) 07/06/2009   Acid reflux 07/06/2009   Glaucoma 07/06/2009   Essential hypertension 07/06/2009   Hypercholesterolemia without hypertriglyceridemia 07/06/2009   Absence of bladder continence 07/06/2009    Past Surgical History:  Procedure Laterality Date   BACK SURGERY     CARPAL TUNNEL RELEASE Left    EYE SURGERY      Prior to Admission medications   Medication Sig Start Date End Date Taking? Authorizing Provider  albuterol (PROVENTIL) (2.5 MG/3ML) 0.083% nebulizer solution Take 3 mLs (2.5 mg total) by nebulization every 6 (six) hours as needed for wheezing or shortness of breath. 10/21/20   Margaretann Loveless, PA-C  albuterol (VENTOLIN HFA) 108 (90 Base) MCG/ACT inhaler Inhale 2 puffs into the lungs every 6 (six) hours as needed for wheezing or shortness of breath. 11/18/20   Salena Saner, MD  atorvastatin (LIPITOR) 40 MG tablet Take 1 tablet (40 mg total) by mouth daily. 04/03/21   Malva Limes, MD  diltiazem (CARDIZEM) 120 MG tablet Take 1 tablet (120 mg total) by mouth daily. 04/03/21   Malva Limes, MD  dorzolamide (TRUSOPT) 2 % ophthalmic solution Place 1 drop into both eyes 2 (two) times daily. 04/01/21   Maple Hudson., MD  fluconazole (DIFLUCAN) 100 MG tablet Take 1 tablet (100 mg total) by mouth daily for 14 days. 07/07/21 07/21/21  Salena Saner, MD  HYDROcodone-acetaminophen  (NORCO) 7.5-325 MG tablet Take 1 tablet by mouth every 6 (six) hours as needed for severe pain. Must last 30 days 07/03/21 08/02/21  Delano Metz, MD  HYDROcodone-acetaminophen (NORCO) 7.5-325 MG tablet Take 1 tablet by mouth every 6 (six) hours as needed for severe pain. Must last 30 days 08/02/21 09/01/21  Delano Metz, MD  HYDROcodone-acetaminophen (NORCO) 7.5-325 MG tablet Take 1 tablet by mouth every 6 (six) hours as needed for severe pain. Must last 30 days 09/01/21 10/01/21  Delano Metz, MD  latanoprost (XALATAN) 0.005 % ophthalmic solution Place 1 drop into both eyes at bedtime. 04/01/21   Maple Hudson., MD  magnesium gluconate (MAGONATE) 500 MG tablet Take 500 mg by mouth daily.    [provider]  meloxicam (MOBIC) 15 MG tablet Take 1 tablet (15 mg total) by mouth daily. 06/02/21   Erasmo Downer, MD  mirtazapine (REMERON) 7.5 MG tablet Take 1 tablet (7.5 mg total) by mouth at bedtime. 04/03/21   Malva Limes, MD  Multiple Vitamin (MULTI-VITAMIN) tablet Take 1 tablet by mouth daily.     [provider]  omeprazole (PRILOSEC) 40 MG capsule Take 1 capsule (40 mg total) by mouth daily. 05/04/21   Erasmo Downer, MD  pregabalin (LYRICA) 150 MG capsule Take 1 capsule (150 mg total) by mouth every 8 (eight) hours. 04/03/21   Malva Limes, MD  ramipril (ALTACE) 2.5 MG capsule Take 1 capsule (2.5 mg total) by mouth daily. 10/21/20 10/21/21  Margaretann Loveless, PA-C  rOPINIRole (REQUIP) 2 MG tablet Take 1 tablet (2 mg total) by mouth 2 (two) times daily. Patient taking differently: Take 2 mg by mouth at bedtime. 10/21/20   Margaretann Loveless, PA-C  spironolactone (ALDACTONE) 25 MG tablet Take 1 tablet (25 mg total) by mouth daily. 04/03/21   Malva Limes, MD  Tiotropium Bromide-Olodaterol (STIOLTO RESPIMAT) 2.5-2.5 MCG/ACT AERS Inhale 2 puffs into the lungs daily. 07/07/21   Salena Saner, MD  tizanidine (ZANAFLEX) 2 MG capsule Take 1  capsule (2 mg total) by mouth 3 (three) times daily as needed for muscle spasms. 05/04/21   Erasmo Downer, MD  torsemide (DEMADEX) 20 MG tablet TAKE 2 TABLETS TWICE A DAY 06/18/21   Bacigalupo, Marzella Schlein, MD  vitamin B-12 (CYANOCOBALAMIN) 500 MCG tablet Take 1 tablet (500 mcg total) by mouth daily. 10/21/20   Margaretann Loveless, PA-C    Allergies Codeine, Cyclobenzaprine, and Penicillins  Family History  Problem Relation Age of Onset   Cancer Mother    Cancer Father     Social History Social History   Tobacco Use   Smoking status: Every Day    Packs/day: 1.50    Years: 50.00    Pack years: 75.00    Types: Cigarettes   Smokeless tobacco: Never   Tobacco comments:    1PPD 07/07/2021  Vaping Use   Vaping Use: Former  Substance Use Topics   Alcohol use: Yes    Alcohol/week: 35.0 standard drinks    Types: 35 Cans of beer per week    Comment: sytates she quit 09/23/2020   Drug use: No    Review of Systems Constitutional: No fever. Eyes: No visual changes. ENT: No sore throat. Cardiovascular: Denies chest pain. Respiratory: Denies shortness of breath. Gastrointestinal: No nausea, vomiting, diarrhea. Genitourinary: Negative for dysuria. Musculoskeletal: Negative for back pain. Skin: Negative for rash. Neurological: Negative for focal weakness or numbness.   ____________________________________________   PHYSICAL EXAM:  VITAL SIGNS: ED Triage Vitals  Enc Vitals Group     BP      Pulse      Resp      Temp      Temp src      SpO2      Weight      Height      Head Circumference      Peak Flow      Pain Score      Pain Loc      Pain Edu?      Excl. in GC?    CONSTITUTIONAL: Alert and oriented and responds appropriately to questions.  Chronically ill-appearing, smells strongly of cigarette smoke, elderly HEAD: Normocephalic; atraumatic EYES: Conjunctivae clear, PERRL, EOMI ENT: normal nose; no rhinorrhea; moist mucous membranes; pharynx without  lesions noted; no dental injury; no septal hematoma NECK: Supple, no meningismus, no LAD; no midline spinal tenderness, step-off or deformity; trachea midline CARD: RRR; S1 and S2 appreciated; no murmurs, no clicks, no rubs, no gallops RESP: Normal chest excursion without splinting or tachypnea; breath sounds clear and equal bilaterally; no wheezes, no rhonchi, no rales; no hypoxia or respiratory distress CHEST:  chest wall stable, no crepitus or ecchymosis or deformity, nontender to palpation; no  flail chest ABD/GI: Normal bowel sounds; non-distended; soft, non-tender, no rebound, no guarding; no ecchymosis or other lesions noted PELVIS:  stable, nontender to palpation BACK:  The back appears normal and is non-tender to palpation, there is no CVA tenderness; no midline spinal tenderness, step-off or deformity EXT: Deformity noted to the mid right femur.  Patient's right leg is shortened and in traction.  I am able to Doppler a 2+ DP pulse.  Feet are cool to touch bilaterally.  Abrasion to the right knee.  No sign of any open fracture.  Compartments are soft.  Otherwise extremities nontender. SKIN: Normal color for age and race; warm NEURO: Moves all extremities equally PSYCH: The patient's mood and manner are appropriate. Grooming and personal hygiene are appropriate.  ____________________________________________   LABS (all labs ordered are listed, but only abnormal results are displayed)  Labs Reviewed  CBC WITH DIFFERENTIAL/PLATELET - Abnormal; Notable for the following components:      Result Value   WBC 10.8 (*)    RBC 3.76 (*)    Hemoglobin 10.3 (*)    HCT 30.5 (*)    RDW 17.9 (*)    Neutro Abs 8.5 (*)    Monocytes Absolute 1.2 (*)    All other components within normal limits  BASIC METABOLIC PANEL - Abnormal; Notable for the following components:   Sodium 130 (*)    Potassium 5.2 (*)    Chloride 94 (*)    Glucose, Bld 103 (*)    BUN 38 (*)    Creatinine, Ser 1.47 (*)     Calcium 8.7 (*)    GFR, Estimated 37 (*)    All other components within normal limits  RESP PANEL BY RT-PCR (FLU A&B, COVID) ARPGX2  PROTIME-INR  TYPE AND SCREEN   ____________________________________________  EKG   EKG Interpretation  Date/Time:  Wednesday July 21 2021 06:05:56 EDT Ventricular Rate:  100 PR Interval:  181 QRS Duration: 96 QT Interval:  356 QTC Calculation: 406 R Axis:   -50 Text Interpretation: Sinus tachycardia Ventricular premature complex Left anterior fascicular block Anterior infarct, old ST elevation, consider inferior injury Confirmed by Rochele Raring [phone]) on 07/21/2021 6:14:16 AM        ____________________________________________  RADIOLOGY Normajean Baxter Barbar Brede, personally viewed and evaluated these images (plain radiographs) as part of my medical decision making, as well as reviewing the written report by the radiologist.  ED MD interpretation: Right distal displaced comminuted femur fracture.  Official radiology report(s): DG Chest Portable 1 View  Result Date: 07/21/2021 CLINICAL DATA:  75 year old female under preoperative evaluation. History of COPD. EXAM: PORTABLE CHEST 1 VIEW COMPARISON:  Chest x-ray 07/15/2020. FINDINGS: Linear area of scarring in the right upper lobe at the site of the previously noted large cavitary lesion. Nodular density projecting over the lateral aspect of the left lower lung, favored to represent a prominent nipple shadow. Lungs are otherwise clear. Skin fold artifact in the lower left hemithorax. No definite pneumothorax. No evidence of pulmonary edema. No pleural effusions. Heart size is normal. Upper mediastinal contours are within normal limits. Atherosclerotic calcifications in the thoracic aorta. IMPRESSION: 1. No radiographic evidence of acute cardiopulmonary disease. 2. Chronic scarring in the right upper lobe at site of prior cavitary lesion. 3. Probable nipple shadow projecting over the lower left lung.  Follow-up standing PA and lateral chest radiograph with nipple markers in place is recommended in the near future to better evaluate this finding. 4. Aortic atherosclerosis. Electronically Signed   By:  Trudie Reed M.D.   On: 07/21/2021 07:03   DG Hip Unilat W or Wo Pelvis 2-3 Views Right  Result Date: 07/21/2021 CLINICAL DATA:  75 year old female with history of trauma from a fall with history of right-sided hip pain. EXAM: DG HIP (WITH OR WITHOUT PELVIS) 2-3V RIGHT COMPARISON:  No priors. FINDINGS: AP view of the bony pelvis and AP and lateral views of the right hip demonstrate no acute displaced fracture of the bony pelvic ring. Right femoral head is located. Proximal right femur is intact (distal femoral fracture described separately). Areas of sclerosis are noted in the femoral heads bilaterally, suggesting avascular necrosis. There is also joint space narrowing, subchondral sclerosis, subchondral cyst formation and osteophyte formation in the hip joints bilaterally indicative of moderate bilateral hip joint osteoarthritis. Orthopedic fixation hardware in the lumbosacral spine incompletely imaged. IMPRESSION: 1. No acute radiographic abnormality of the bony pelvic ring or right hip. 2. Moderate bilateral hip joint osteoarthritis. 3. 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 4. Distal displaced and comminuted femoral diaphyseal fracture separately described. Electronically Signed   By: Trudie Reed M.D.   On: 07/21/2021 07:01   DG Femur Portable Min 2 Views Right  Result Date: 07/21/2021 CLINICAL DATA:  75 year old female with history of trauma from a fall. Right-sided leg pain. EXAM: RIGHT FEMUR PORTABLE 2 VIEW COMPARISON:  No priors. FINDINGS: Multiple views of the right femur demonstrate an acute displaced comminuted spiral fracture of the distal third of the femoral diaphysis with approximately one shaft width of posterior displacement of the distal fracture  fragment. Large triangular-shaped separate fracture fragment also noted. Numerous vascular calcifications are noted. IMPRESSION: 1. Acute displaced comminuted spiral fracture of the distal third of the femoral diaphysis, as above. 2. Atherosclerosis. Electronically Signed   By: Trudie Reed M.D.   On: 07/21/2021 06:59    ____________________________________________   PROCEDURES  Procedure(s) performed (including Critical Care):  Procedures  ____________________________________________   INITIAL IMPRESSION / ASSESSMENT AND PLAN / ED COURSE  As part of my medical decision making, I reviewed the following data within the electronic MEDICAL RECORD NUMBER Nursing notes reviewed and incorporated, Labs reviewed , EKG interpreted , Old EKG reviewed, Old chart reviewed, Radiograph reviewed , Discussed with admitting physician , A consult was requested and obtained from this/these consultant(s) Orthopedics, and Notes from prior ED visits         Patient here with fall with concerns for femur, hip fracture.  Will obtain x-rays, screening labs, EKG, chest x-ray.  Will give pain medication.  We will update her tetanus vaccination.  ED PROGRESS  7:15 AM  Pt has an acute displaced comminuted spiral fracture of the distal third of the femoral shaft.  Discussed this with Dr. Rosita Kea with orthopedic surgery.  He recommends admitting to the hospitalist service.  Patient has been updated with his plan.  We will keep n.p.o.  States she has been n.p.o. since 6 PM last night.  Labs also show mild AKI and hyperkalemia without EKG changes.  Will give IV fluids.  Pain has been well controlled with fentanyl.  7:28 AM Discussed patient's case with hospitalist, Dr. Joylene Igo.  I have recommended admission and patient (and family if present) agree with this plan. Admitting physician will place admission orders.   I reviewed all nursing notes, vitals, pertinent previous records and reviewed/interpreted all EKGs, lab and  urine results, imaging (as available).  ____________________________________________   FINAL CLINICAL IMPRESSION(S) / ED DIAGNOSES  Final diagnoses:  Fall, initial encounter  AKI (acute kidney injury) (HCC)  Hyperkalemia  Other fracture of right femur, initial encounter for closed fracture [facility])     ED Discharge Orders     None       *Please Avila:  Suzanne Avila was evaluated in Emergency Department on 07/21/2021 for the symptoms described in the history of present illness. She was evaluated in the context of the global COVID-19 pandemic, which necessitated consideration that the patient might be at risk for infection with the SARS-CoV-2 virus that causes COVID-19. Institutional protocols and algorithms that pertain to the evaluation of patients at risk for COVID-19 are in a state of rapid change based on information released by regulatory bodies including the CDC and federal and state organizations. These policies and algorithms were followed during the patient's care in the ED.  Some ED evaluations and interventions may be delayed as a result of limited staffing during and the pandemic.*   Avila:  This document was prepared using Dragon voice recognition software and may include unintentional dictation errors.    Little Winton, Layla Maw, DO 07/21/21 [phone]

## 2021-07-21 NOTE — Anesthesia Procedure Notes (Signed)
Procedure Name: Intubation Date/Time: 07/21/2021 3:08 PM Performed by: Reece Agar, CRNA Pre-anesthesia Checklist: Patient identified, Emergency Drugs available, Suction available and Patient being monitored Patient Re-evaluated:Patient Re-evaluated prior to induction Oxygen Delivery Method: Circle system utilized Preoxygenation: Pre-oxygenation with 100% oxygen Induction Type: IV induction Ventilation: Mask ventilation without difficulty Laryngoscope Size: McGraph and 3 Grade View: Grade I Tube type: Oral Tube size: 6.5 mm Number of attempts: 1 Airway Equipment and Method: Stylet and Oral airway Placement Confirmation: ETT inserted through vocal cords under direct vision, positive ETCO2 and breath sounds checked- equal and bilateral Secured at: 19 cm Tube secured with: Tape Dental Injury: Teeth and Oropharynx as per pre-operative assessment

## 2021-07-21 NOTE — H&P (Signed)
History and Physical    Suzanne Avila HKV:[id number] DOB: [birth year] DOA: 07/21/2021  PCP: Erasmo Downer, MD   Patient coming from: Home  I have personally briefly reviewed patient's old medical records in [facility]  Chief Complaint: Right hip pain  HPI: Suzanne Avila is a 75 y.o. female with medical history significant for nicotine dependence, COPD, paroxysmal atrial fibrillation, hypertension, dyslipidemia who presents to the ER via EMS for evaluation of pain in her right thigh following a fall. Patient states that she was putting on her pants and thinks her legs were tangled and she fell.  She denied feeling dizzy or lightheaded and denied having any chest pain.  She denies any loss of consciousness. She denies having any abdominal pain, no nausea, no vomiting, no changes in her bowel habits, no fever, no chills, no urinary symptoms, no headache, no palpitations, no diaphoresis, no leg swelling, no blurred vision or focal deficit. When EMS arrived she was noted to have an obvious deformity to the right mid femur.  She was placed in traction and transported to the ER. Labs show sodium 130, potassium 5.2, chloride 94, bicarb 22, glucose 103, BUN 38, creatinine 1.47, calcium 8.7, white count 10.8.,  Hemoglobin 10.3, hematocrit 30.5, MCV 81, RDW 17.9, platelet count 289, PT 12.9, INR 1.0 Respiratory viral panel is negative Chest x-ray reviewed by me shows chronic scarring in the right upper lobe at the site of prior cavitary lesion.  Aortic atherosclerosis Right hip x-ray shows a distal displaced and comminuted femoral diaphyseal fracture. Twelve-lead EKG reviewed by me shows sinus tachycardia with PVCs and left anterior fascicular block  ED Course: Patient is a 75 year old female who presents to the ER for evaluation after mechanical fall at home and is found to have a distal displaced and comminuted femoral diaphyseal fracture. Orthopedic surgery has been consulted in the  ER.   Review of Systems: As per HPI otherwise all other systems reviewed and negative.    Past Medical History:  Diagnosis Date   Closed left arm fracture    COPD (chronic obstructive pulmonary disease) (HCC)    Dehydration 09/15/2020   Glaucoma    Hyperlipidemia    Hypertension    Lung mass     Past Surgical History:  Procedure Laterality Date   BACK SURGERY     CARPAL TUNNEL RELEASE Left    EYE SURGERY       reports that she has been smoking cigarettes. She has a 75.00 pack-year smoking history. She has never used smokeless tobacco. She reports current alcohol use of about 35.0 standard drinks per week. She reports that she does not use drugs.  Allergies  Allergen Reactions   Codeine Itching   Cyclobenzaprine Itching   Penicillins     Itching without rash per patient.  Unable to say when it happened, other than "a long time ago"    Family History  Problem Relation Age of Onset   Cancer Mother    Cancer Father       Prior to Admission medications   Medication Sig Start Date End Date Taking? Authorizing Provider  albuterol (PROVENTIL) (2.5 MG/3ML) 0.083% nebulizer solution Take 3 mLs (2.5 mg total) by nebulization every 6 (six) hours as needed for wheezing or shortness of breath. 10/21/20   Margaretann Loveless, PA-C  albuterol (VENTOLIN HFA) 108 (90 Base) MCG/ACT inhaler Inhale 2 puffs into the lungs every 6 (six) hours as needed for wheezing or shortness of breath. 11/18/20  Salena Saner, MD  atorvastatin (LIPITOR) 40 MG tablet Take 1 tablet (40 mg total) by mouth daily. 04/03/21   Malva Limes, MD  diltiazem (CARDIZEM) 120 MG tablet Take 1 tablet (120 mg total) by mouth daily. 04/03/21   Malva Limes, MD  dorzolamide (TRUSOPT) 2 % ophthalmic solution Place 1 drop into both eyes 2 (two) times daily. 04/01/21   Maple Hudson., MD  fluconazole (DIFLUCAN) 100 MG tablet Take 1 tablet (100 mg total) by mouth daily for 14 days. 07/07/21 07/21/21  Salena Saner, MD  HYDROcodone-acetaminophen (NORCO) 7.5-325 MG tablet Take 1 tablet by mouth every 6 (six) hours as needed for severe pain. Must last 30 days 07/03/21 08/02/21  Delano Metz, MD  HYDROcodone-acetaminophen (NORCO) 7.5-325 MG tablet Take 1 tablet by mouth every 6 (six) hours as needed for severe pain. Must last 30 days 08/02/21 09/01/21  Delano Metz, MD  HYDROcodone-acetaminophen (NORCO) 7.5-325 MG tablet Take 1 tablet by mouth every 6 (six) hours as needed for severe pain. Must last 30 days 09/01/21 10/01/21  Delano Metz, MD  latanoprost (XALATAN) 0.005 % ophthalmic solution Place 1 drop into both eyes at bedtime. 04/01/21   Maple Hudson., MD  magnesium gluconate (MAGONATE) 500 MG tablet Take 500 mg by mouth daily.    [provider]  meloxicam (MOBIC) 15 MG tablet Take 1 tablet (15 mg total) by mouth daily. 06/02/21   Erasmo Downer, MD  mirtazapine (REMERON) 7.5 MG tablet Take 1 tablet (7.5 mg total) by mouth at bedtime. 04/03/21   Malva Limes, MD  Multiple Vitamin (MULTI-VITAMIN) tablet Take 1 tablet by mouth daily.     [provider]  omeprazole (PRILOSEC) 40 MG capsule Take 1 capsule (40 mg total) by mouth daily. 05/04/21   Bacigalupo, Marzella Schlein, MD  pregabalin (LYRICA) 150 MG capsule Take 1 capsule (150 mg total) by mouth every 8 (eight) hours. 04/03/21   Malva Limes, MD  ramipril (ALTACE) 2.5 MG capsule Take 1 capsule (2.5 mg total) by mouth daily. 10/21/20 10/21/21  Margaretann Loveless, PA-C  rOPINIRole (REQUIP) 2 MG tablet Take 1 tablet (2 mg total) by mouth 2 (two) times daily. Patient taking differently: Take 2 mg by mouth at bedtime. 10/21/20   Margaretann Loveless, PA-C  spironolactone (ALDACTONE) 25 MG tablet Take 1 tablet (25 mg total) by mouth daily. 04/03/21   Malva Limes, MD  Tiotropium Bromide-Olodaterol (STIOLTO RESPIMAT) 2.5-2.5 MCG/ACT AERS Inhale 2 puffs into the lungs daily. 07/07/21   Salena Saner, MD   tizanidine (ZANAFLEX) 2 MG capsule Take 1 capsule (2 mg total) by mouth 3 (three) times daily as needed for muscle spasms. 05/04/21   Erasmo Downer, MD  torsemide (DEMADEX) 20 MG tablet TAKE 2 TABLETS TWICE A DAY 06/18/21   Bacigalupo, Marzella Schlein, MD  vitamin B-12 (CYANOCOBALAMIN) 500 MCG tablet Take 1 tablet (500 mcg total) by mouth daily. 10/21/20   Margaretann Loveless, PA-C    Physical Exam: Vitals:   07/21/21 0630 07/21/21 0650 07/21/21 0700 07/21/21 0730  BP: (!) 116/55  139/64 130/78  Pulse: 76   82  Resp: 12  15 14   Temp:      TempSrc:      SpO2: 96%   96%  Weight:  42 kg    Height:  4\' 8"  (1.422 m)       Vitals:   07/21/21 0630 07/21/21 0650 07/21/21 0700 07/21/21 0730  BP: (!) 116/55  139/64 130/78  Pulse: 76   82  Resp: 12  15 14   Temp:      TempSrc:      SpO2: 96%   96%  Weight:  42 kg    Height:  4\' 8"  (1.422 m)        Constitutional: Alert and oriented x 3 . Not in any apparent distress HEENT:      Head: Normocephalic and atraumatic.         Eyes: PERLA, EOMI, Conjunctivae are normal. Sclera is non-icteric.       Mouth/Throat: Mucous membranes are moist.       Neck: Supple with no signs of meningismus. Cardiovascular: Sinus tachycardia. No murmurs, gallops, or rubs. 2+ symmetrical distal pulses are present . No JVD. No LE edema Respiratory: Respiratory effort normal .Lungs sounds clear bilaterally. No wheezes, crackles, or rhonchi.  Gastrointestinal: Soft, non tender, and non distended with positive bowel sounds.  Genitourinary: No CVA tenderness. Musculoskeletal: Right leg is shortened and externally rotated. No cyanosis, or erythema of extremities. Neurologic:  Face is symmetric. Moving all extremities. No gross focal neurologic deficits . Skin: Skin is warm, dry.  No rash or ulcers Psychiatric: Mood and affect are normal    Labs on Admission: I have personally reviewed following labs and imaging studies  CBC: Recent Labs  Lab 07/21/21 0553   WBC 10.8*  NEUTROABS 8.5*  HGB 10.3*  HCT 30.5*  MCV 81.1  PLT 289   Basic Metabolic Panel: Recent Labs  Lab 07/21/21 0553  NA 130*  K 5.2*  CL 94*  CO2 22  GLUCOSE 103*  BUN 38*  CREATININE 1.47*  CALCIUM 8.7*   GFR: Estimated Creatinine Clearance: 18.7 mL/min (A) (by C-G formula based on SCr of 1.47 mg/dL (H)). Liver Function Tests: No results for input(s): AST, ALT, ALKPHOS, BILITOT, PROT, ALBUMIN in the last 168 hours. No results for input(s): LIPASE, AMYLASE in the last 168 hours. No results for input(s): AMMONIA in the last 168 hours. Coagulation Profile: Recent Labs  Lab 07/21/21 0553  INR 1.0   Cardiac Enzymes: No results for input(s): CKTOTAL, CKMB, CKMBINDEX, TROPONINI in the last 168 hours. BNP (last 3 results) No results for input(s): PROBNP in the last 8760 hours. HbA1C: No results for input(s): HGBA1C in the last 72 hours. CBG: No results for input(s): GLUCAP in the last 168 hours. Lipid Profile: No results for input(s): CHOL, HDL, LDLCALC, TRIG, CHOLHDL, LDLDIRECT in the last 72 hours. Thyroid Function Tests: No results for input(s): TSH, T4TOTAL, FREET4, T3FREE, THYROIDAB in the last 72 hours. Anemia Panel: No results for input(s): VITAMINB12, FOLATE, FERRITIN, TIBC, IRON, RETICCTPCT in the last 72 hours. Urine analysis:    Component Value Date/Time   COLORURINE YELLOW (A) 09/14/2020 1629   APPEARANCEUR HAZY (A) 09/14/2020 1629   APPEARANCEUR Clear 03/22/2014 2246   LABSPEC 1.009 09/14/2020 1629   LABSPEC 1.002 03/22/2014 2246   PHURINE 6.0 09/14/2020 1629   GLUCOSEU NEGATIVE 09/14/2020 1629   GLUCOSEU Negative 03/22/2014 2246   HGBUR NEGATIVE 09/14/2020 1629   BILIRUBINUR NEGATIVE 09/14/2020 1629   BILIRUBINUR Negative 03/22/2014 2246   KETONESUR NEGATIVE 09/14/2020 1629   PROTEINUR NEGATIVE 09/14/2020 1629   NITRITE NEGATIVE 09/14/2020 1629   LEUKOCYTESUR LARGE (A) 09/14/2020 1629   LEUKOCYTESUR Negative 03/22/2014 2246     Radiological Exams on Admission: DG Chest Portable 1 View  Result Date: 07/21/2021 CLINICAL DATA:  75 year old female under preoperative evaluation. History of COPD. EXAM:  PORTABLE CHEST 1 VIEW COMPARISON:  Chest x-ray 07/15/2020. FINDINGS: Linear area of scarring in the right upper lobe at the site of the previously noted large cavitary lesion. Nodular density projecting over the lateral aspect of the left lower lung, favored to represent a prominent nipple shadow. Lungs are otherwise clear. Skin fold artifact in the lower left hemithorax. No definite pneumothorax. No evidence of pulmonary edema. No pleural effusions. Heart size is normal. Upper mediastinal contours are within normal limits. Atherosclerotic calcifications in the thoracic aorta. IMPRESSION: 1. No radiographic evidence of acute cardiopulmonary disease. 2. Chronic scarring in the right upper lobe at site of prior cavitary lesion. 3. Probable nipple shadow projecting over the lower left lung. Follow-up standing PA and lateral chest radiograph with nipple markers in place is recommended in the near future to better evaluate this finding. 4. Aortic atherosclerosis. Electronically Signed   By: Trudie Reed M.D.   On: 07/21/2021 07:03   DG Hip Unilat W or Wo Pelvis 2-3 Views Right  Result Date: 07/21/2021 CLINICAL DATA:  75 year old female with history of trauma from a fall with history of right-sided hip pain. EXAM: DG HIP (WITH OR WITHOUT PELVIS) 2-3V RIGHT COMPARISON:  No priors. FINDINGS: AP view of the bony pelvis and AP and lateral views of the right hip demonstrate no acute displaced fracture of the bony pelvic ring. Right femoral head is located. Proximal right femur is intact (distal femoral fracture described separately). Areas of sclerosis are noted in the femoral heads bilaterally, suggesting avascular necrosis. There is also joint space narrowing, subchondral sclerosis, subchondral cyst formation and osteophyte formation in  the hip joints bilaterally indicative of moderate bilateral hip joint osteoarthritis. Orthopedic fixation hardware in the lumbosacral spine incompletely imaged. IMPRESSION: 1. No acute radiographic abnormality of the bony pelvic ring or right hip. 2. Moderate bilateral hip joint osteoarthritis. 3. 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 4. Distal displaced and comminuted femoral diaphyseal fracture separately described. Electronically Signed   By: Trudie Reed M.D.   On: 07/21/2021 07:01   DG Femur Portable Min 2 Views Right  Result Date: 07/21/2021 CLINICAL DATA:  75 year old female with history of trauma from a fall. Right-sided leg pain. EXAM: RIGHT FEMUR PORTABLE 2 VIEW COMPARISON:  No priors. FINDINGS: Multiple views of the right femur demonstrate an acute displaced comminuted spiral fracture of the distal third of the femoral diaphysis with approximately one shaft width of posterior displacement of the distal fracture fragment. Large triangular-shaped separate fracture fragment also noted. Numerous vascular calcifications are noted. IMPRESSION: 1. Acute displaced comminuted spiral fracture of the distal third of the femoral diaphysis, as above. 2. Atherosclerosis. Electronically Signed   By: Trudie Reed M.D.   On: 07/21/2021 06:59     Assessment/Plan Principal Problem:   Femur fracture, right (HCC) Active Problems:   COPD (chronic obstructive pulmonary disease) (HCC)   Essential hypertension   Tobacco use disorder   AF (paroxysmal atrial fibrillation) (HCC)   Fall at home, initial encounter   AKI (acute kidney injury) [facility])   Depression       Patient is a 75 year old female who presents to the ER for evaluation of pain in her right lower extremity following a fall and is found to have a right femoral fracture    Right femur fracture Status post mechanical fall Immobilize right lower extremity Pain control and muscle relaxants Consult  orthopedic surgery     Paroxysmal atrial fibrillation Continue Cardizem for rate control Patient not  on long-term anticoagulation despite recommendations from cardiology. Chart review shows that anticoagulation was not initiated because patient was scheduled for a bronchoscopy. Will consider initiation of long-term anticoagulation after patient's surgery if no further contraindications.     Acute kidney injury At baseline patient has serum creatinine of 0.97 and today on admission it is 1.47 Gentle IV fluid hydration Hold ramipril, spironolactone and torsemide     COPD Not acutely exacerbated Continue as needed bronchodilator therapy and inhaled steroids    Depression Continue mirtazapine   DVT prophylaxis: SCD Code Status: full code  Family Communication: Greater than 50% of time was spent in patient's condition and plan of care with her at the bedside.  All questions and concerns have been addressed.  She verbalizes understanding and agrees with the plan. Disposition Plan: Back to previous home environment Consults called: Orthopedic surgery Status:At the time of admission, it appears that the appropriate admission status for this patient is inpatient. This is judged to be reasonable and necessary to provide the required intensity of service to ensure the patient's safety given the presenting symptoms, physical exam findings, and initial radiographic and laboratory data in the context of their comorbid conditions. Patient requires inpatient status due to high intensity of service, high risk for further deterioration and high frequency of surveillance required.     Lucile Shutters MD [facility]     07/21/2021, 9:11 AM

## 2021-07-22 ENCOUNTER — Encounter: Payer: Self-pay | Admitting: Internal Medicine

## 2021-07-22 ENCOUNTER — Ambulatory Visit: Admission: RE | Admit: 2021-07-22 | Payer: Medicare Other | Source: Ambulatory Visit

## 2021-07-22 DIAGNOSIS — Y92009 Unspecified place in unspecified non-institutional (private) residence as the place of occurrence of the external cause: Secondary | ICD-10-CM

## 2021-07-22 DIAGNOSIS — W19XXXA Unspecified fall, initial encounter: Secondary | ICD-10-CM

## 2021-07-22 LAB — CBC
HCT: 21.7 % — ABNORMAL LOW (ref 36.0–46.0)
Hemoglobin: 7.2 g/dL — ABNORMAL LOW (ref 12.0–15.0)
MCH: 27.3 pg (ref 26.0–34.0)
MCHC: 33.2 g/dL (ref 30.0–36.0)
MCV: 82.2 fL (ref 80.0–100.0)
Platelets: 257 10*3/uL (ref 150–400)
RBC: 2.64 MIL/uL — ABNORMAL LOW (ref 3.87–5.11)
RDW: 17.9 % — ABNORMAL HIGH (ref 11.5–15.5)
WBC: 6.2 10*3/uL (ref 4.0–10.5)
nRBC: 0 % (ref 0.0–0.2)

## 2021-07-22 LAB — BASIC METABOLIC PANEL
Anion gap: 6 (ref 5–15)
BUN: 24 mg/dL — ABNORMAL HIGH (ref 8–23)
CO2: 24 mmol/L (ref 22–32)
Calcium: 8.3 mg/dL — ABNORMAL LOW (ref 8.9–10.3)
Chloride: 101 mmol/L (ref 98–111)
Creatinine, Ser: 0.95 mg/dL (ref 0.44–1.00)
GFR, Estimated: >60 mL/min (ref >60)
Glucose, Bld: 210 mg/dL — ABNORMAL HIGH (ref 70–99)
Potassium: 4.9 mmol/L (ref 3.5–5.1)
Sodium: 131 mmol/L — ABNORMAL LOW (ref 135–145)

## 2021-07-22 LAB — ABO/RH: ABO/RH(D): B POS

## 2021-07-22 LAB — PREPARE RBC (CROSSMATCH)

## 2021-07-22 MED ORDER — ENOXAPARIN SODIUM 30 MG/0.3ML IJ SOSY
30.0000 mg | PREFILLED_SYRINGE | Freq: Every day | INTRAMUSCULAR | Status: DC
  Administered 2021-07-22 – 2021-07-26 (×5): 30 mg via SUBCUTANEOUS
  Filled 2021-07-22 (×4): qty 0.3

## 2021-07-22 MED ORDER — FE FUMARATE-B12-VIT C-FA-IFC PO CAPS
1.0000 | ORAL_CAPSULE | Freq: Two times a day (BID) | ORAL | Status: DC
  Administered 2021-07-22 – 2021-07-26 (×9): 1 via ORAL
  Filled 2021-07-22 (×11): qty 1

## 2021-07-22 MED ORDER — ENSURE ENLIVE PO LIQD
237.0000 mL | Freq: Two times a day (BID) | ORAL | Status: DC
  Administered 2021-07-24 – 2021-07-26 (×5): 237 mL via ORAL

## 2021-07-22 MED ORDER — SODIUM CHLORIDE 0.9% IV SOLUTION: Freq: Once | INTRAVENOUS | Status: AC

## 2021-07-22 NOTE — Progress Notes (Signed)
Physical Therapy Treatment Patient Details Name: Suzanne Avila MRN: [id number] DOB: [birth year] Today's Date: 07/22/2021   History of Present Illness Suzanne Avila is a 75 y.o. female with medical history significant for nicotine dependence, COPD, paroxysmal atrial fibrillation, hypertension, dyslipidemia who presents to the ER via EMS for evaluation of pain in her right thigh following a fall and is found to have a distal displaced and comminuted femoral diaphyseal fracture. Underwent R IM nailing on 9/28 with Dr. Rosita Kea.    PT Comments    Pt received seated in recliner chair with nurses present to provide transfusion. Pt agreeable to some light exercises while seated in chair. Pt given handout of HEP and demonstrated all exercises with verbal cueing for form. Pt continues to report some increased R knee pain and no R hip pain. Pt continues to demonstrate some decreased awareness of her deficits and weight bearing precautions. Pt would benefit from SNF placement, however she may decline this recommendation. Pt will continue to benefit from skilled PT services to improve mobility, strength, and ROM to be able to return to previous level of function.    Recommendations for follow up therapy are one component of a multi-disciplinary discharge planning process, led by the attending physician.  Recommendations may be updated based on patient status, additional functional criteria and insurance authorization.  Follow Up Recommendations  SNF     Equipment Recommendations  None recommended by PT    Recommendations for Other Services       Precautions / Restrictions Precautions Precautions: Fall Restrictions Weight Bearing Restrictions: Yes RLE Weight Bearing: Partial weight bearing RLE Partial Weight Bearing Percentage or Pounds: 25 Other Position/Activity Restrictions: PWB 25% only on R LE     Mobility  Bed Mobility               General bed mobility comments: Deferred, pt  received in recliner chair    Transfers Overall transfer level: Needs assistance Equipment used: Rolling walker (2 wheeled) Transfers: Stand Pivot Transfers   Stand pivot transfers: Mod assist       General transfer comment: Pt declining any OOB mobility at this time and about to receive transfusion  Ambulation/Gait             General Gait Details: Pt declining any OOB mobility at this time and about to receive transfusion   Stairs             Wheelchair Mobility    Modified Rankin (Stroke Patients Only)       Balance Overall balance assessment: History of Falls                                          Cognition Arousal/Alertness: Awake/alert Behavior During Therapy: WFL for tasks assessed/performed Overall Cognitive Status: No family/caregiver present to determine baseline cognitive functioning                                 General Comments: Continues to have some trouble with recall and requiring additional time to answer questions      Exercises Total Joint Exercises Ankle Circles/Pumps: AROM;Strengthening;Both;20 reps;Other (comment) (Long Sitting) Quad Sets: AROM;Strengthening;Right;10 reps;Other (comment) (Long sitting) Gluteal Sets: AROM;Strengthening;Both;10 reps;Other (comment) (Long sitting) Short Arc Quad: AROM;Strengthening;Right;10 reps;Other (comment) (Long sitting) Heel Slides: AAROM;Right;10 reps;Supine Straight Leg Raises: AAROM;Strengthening;Right;10 reps;Supine Other  Exercises Other Exercises: Pt given handout of exercises for her to begin with. Given demonstration and verbal cues for all exercises completed.    General Comments        Pertinent Vitals/Pain Pain Assessment: Faces Faces Pain Scale: Hurts little more Pain Location: R knee pain Pain Descriptors / Indicators: Guarding;Grimacing Pain Intervention(s): Limited activity within patient's tolerance;Monitored during session;Utilized  relaxation techniques    Home Living Family/patient expects to be discharged to:: Private residence Living Arrangements: Spouse/significant other;Children Available Help at Discharge: Family;Available 24 hours/day Type of Home: House Home Access: Level entry;Ramped entrance   Home Layout: One level Home Equipment: Bedside commode;Shower seat;Walker - 4 wheels;Walker - 2 wheels;Grab bars - tub/shower;Hand held shower head;Adaptive equipment;Cane - single point Additional Comments: Pt reports that her son assists her spouse during the day and that she was independent with usage of devices at baseline    Prior Function Level of Independence: Independent with assistive device(s)      Comments: IND with mobility using rollator in house and [facility] for longer distances. No other falls reported in past 2 months.   PT Goals (current goals can now be found in the care plan section) Acute Rehab PT Goals Patient Stated Goal: feel better and go back home PT Goal Formulation: With patient Time For Goal Achievement: 08/05/21 Potential to Achieve Goals: Fair Progress towards PT goals: Progressing toward goals    Frequency    BID      PT Plan Current plan remains appropriate    Co-evaluation              AM-PAC PT "6 Clicks" Mobility   Outcome Measure  Help needed turning from your back to your side while in a flat bed without using bedrails?: Total Help needed moving from lying on your back to sitting on the side of a flat bed without using bedrails?: A Lot Help needed moving to and from a bed to a chair (including a wheelchair)?: A Lot Help needed standing up from a chair using your arms (e.g., wheelchair or bedside chair)?: A Little Help needed to walk in hospital room?: Total Help needed climbing 3-5 steps with a railing? : Total 6 Click Score: 10    End of Session Equipment Utilized During Treatment: Gait belt Activity Tolerance: Patient limited by pain Patient left: in  chair;with call bell/phone within reach;with SCD's reapplied Nurse Communication: Mobility status PT Visit Diagnosis: Unsteadiness on feet (R26.81);Other abnormalities of gait and mobility (R26.89);Muscle weakness (generalized) (M62.81)     Time: 5638-7564 PT Time Calculation (min) (ACUTE ONLY): 20 min  Charges:                      Verl Blalock, SPT    Verl Blalock 07/22/2021, 4:08 PM

## 2021-07-22 NOTE — Evaluation (Signed)
Occupational Therapy Evaluation Patient Details Name: Suzanne Avila MRN: [id number] DOB: 1945/12/19 Today's Date: 07/22/2021   History of Present Illness 75yo pt admitted after mechanical fall resulting in distal displaced and comminuted femoral fracture requiring surgical repair. Pt now s/p R IM nailing, RLE 25% PWBing on 07/21/21. PMHx includes COPD, smoker, HTN, DOE, GERD, depression, arthritis, fibromyalgia, paroxysmal Afib, dyslipidemia, and anemia.   Clinical Impression   Pt seen for OT evaluation this date, POD#1 from above surgery. Pt was independent in all ADL prior to surgery, however occasionally using a SPC for long "open distances" such as when walking to/from a parking lot. Pt also endorses having rollators in her home and in her car available if needed. Pt up in recliner, RN in room administering medications upon OT's arrival. Pt slightly confused, alert and oriented x3, demonstrating difficulty with sense of time and increased processing time. Difficulty understanding how to use the phone. Pt had difficulty with the 10pt pain scale but was able to indicate "medium" amount of pain when scale adapted.   Pt demonstrates impairments in R hip/leg pain, strength, balance, cognition, and awareness of precautions, resulting in increased assist required for ADL safety. Pt currently requires MAX A for seated LB ADL tasks. Pt is eager to return to PLOF with less pain and improved safety and independence. Pt instructed in self care skills, falls prevention strategies, home/routines modifications, DME/AE for LB bathing and dressing tasks. Pt would benefit from additional instruction in self care skills and techniques to help maintain precautions with or without assistive devices to support recall and carryover prior to discharge. Currently recommend SNF upon discharge.      Recommendations for follow up therapy are one component of a multi-disciplinary discharge planning process, led by the  attending physician.  Recommendations may be updated based on patient status, additional functional criteria and insurance authorization.   Follow Up Recommendations  SNF    Equipment Recommendations  3 in 1 bedside commode    Recommendations for Other Services       Precautions / Restrictions Precautions Precautions: Fall Restrictions Weight Bearing Restrictions: Yes RLE Weight Bearing: Partial weight bearing RLE Partial Weight Bearing Percentage or Pounds: 25%      Mobility Bed Mobility               General bed mobility comments: deferred, up in recliner. RN reports +2 nurses to assist with transfer to recliner to maximize PWBing adherence    Transfers                 General transfer comment: Pt declined, pain limited, and breakfast arrived    Balance Overall balance assessment: History of Falls                                         ADL either performed or assessed with clinical judgement   ADL Overall ADL's : Needs assistance/impaired                                       General ADL Comments: Pt currently demonstrates increased need for assist for LB ADL MAX A from seated position, anticipate increased assist for ADL transfers (will further assess next session), and set up and supervision for seated UB ADL tasks.     Vision  Perception     Praxis      Pertinent Vitals/Pain Pain Assessment: 0-10 Pain Score: 5  Pain Location: Pt had difficulty with 10pt scale, when provided with "small, medium, and large" amount of pain, pt endorsed "medium" Pain Descriptors / Indicators: Guarding;Grimacing Pain Intervention(s): Limited activity within patient's tolerance;Monitored during session;RN gave pain meds during session     Hand Dominance Left   Extremity/Trunk Assessment Upper Extremity Assessment Upper Extremity Assessment: Generalized weakness (hx R index finger partial amputation)   Lower  Extremity Assessment Lower Extremity Assessment: Generalized weakness;RLE deficits/detail RLE Deficits / Details: s/p IMN RLE: Unable to fully assess due to pain       Communication Communication Communication: No difficulties   Cognition Arousal/Alertness: Awake/alert Behavior During Therapy: WFL for tasks assessed/performed Overall Cognitive Status: No family/caregiver present to determine baseline cognitive functioning                                 General Comments: Pt slightly confused, RN confirms. Pt demo's difficulty with phone use, increased processing time, alert and oriented generally x3, has difficulty with sense of time. Blinds opened, lights on to promote delirium prevention.   General Comments       Exercises Other Exercises Other Exercises: Pt instructed in [facility] use for overnight toileting, PWBing precautions, AE/DME for safer bathing/dressing.   Shoulder Instructions      Home Living Family/patient expects to be discharged to:: Private residence Living Arrangements: Spouse/significant other;Children (son and son's friend) Available Help at Discharge: Family;Available 24 hours/day Type of Home: House Home Access: Level entry;Ramped entrance     Home Layout: One level     Bathroom Shower/Tub: Walk-in shower;Tub/shower unit (uses walk in)   [employer]: Handicapped height (has both)     Home Equipment: Bedside commode;Shower seat;Walker - 4 wheels;Walker - 2 wheels;Grab bars - tub/shower;Hand held shower head;Adaptive equipment;Cane - single point Adaptive Equipment: Reacher        Prior Functioning/Environment Level of Independence: Independent with assistive device(s)        Comments: Pt generally indep with mobility, using SPC for longer "open distances", has rollators in the house and in car if she needs them. Indep with basic ADL, pt and son cook and clean, and pt endorses indep with med mgt and driving. Pt denies additional  falls in past 59mo.        OT Problem List: Decreased strength;Pain;Decreased cognition;Decreased safety awareness;Decreased knowledge of use of DME or AE;Impaired balance (sitting and/or standing);Decreased knowledge of precautions      OT Treatment/Interventions: Self-care/ADL training;Therapeutic exercise;Therapeutic activities;Cognitive remediation/compensation;DME and/or AE instruction;Patient/family education;Balance training    OT Goals(Current goals can be found in the care plan section) Acute Rehab OT Goals Patient Stated Goal: feel better and go back home OT Goal Formulation: With patient Time For Goal Achievement: 08/05/21 Potential to Achieve Goals: Good ADL Goals Pt Will Perform Lower Body Dressing: sitting/lateral leans;with mod assist;with adaptive equipment Pt Will Transfer to Toilet: with mod assist;stand pivot transfer;bedside commode (LRAD, maintaining RLE 25% PWB) Additional ADL Goal #1: Pt will independently instruct family/caregiver in compression stocking mgt Additional ADL Goal #2: Pt will independently identify at least 2 strategies to prevent future falls to maximize safety.  OT Frequency: Min 2X/week   Barriers to D/C:            Co-evaluation              AM-PAC  OT "6 Clicks" Daily Activity     Outcome Measure Help from another person eating meals?: None Help from another person taking care of personal grooming?: None Help from another person toileting, which includes using toliet, bedpan, or urinal?: A Lot Help from another person bathing (including washing, rinsing, drying)?: A Lot Help from another person to put on and taking off regular upper body clothing?: None Help from another person to put on and taking off regular lower body clothing?: A Lot 6 Click Score: 18   End of Session    Activity Tolerance: Patient limited by pain;Other (comment) (a bit confused) Patient left: in chair;with call bell/phone within reach  OT Visit Diagnosis:  Other abnormalities of gait and mobility (R26.89);History of falling (Z91.81);Muscle weakness (generalized) (M62.81);Pain Pain - Right/Left: Right Pain - part of body: Hip;Knee;Leg                Time: 4782-9562 OT Time Calculation (min): 27 min Charges:  OT General Charges $OT Visit: 1 Visit OT Evaluation $OT Eval Moderate Complexity: 1 Mod OT Treatments $Self Care/Home Management : 8-22 mins  Arman Filter., MPH, MS, OTR/L ascom [phone] 07/22/21, 9:50 AM

## 2021-07-22 NOTE — TOC Progression Note (Signed)
Transition of Care [facility]) - Progression Note    Patient Details  Name: Suzanne Avila MRN: [id number] Date of Birth: Mar 20, 1946  Transition of Care [facility]) CM/SW [place], RN Phone Number: 07/22/2021, 2:51 PM  Clinical Narrative:   Met with the patient to review the bed options with ehr, She requested that I reach out to [facility] to see if they could offer a bed, I reached out to [facility] and resent it to [facility] in [employer],     Expected Discharge Plan: [place] Barriers to Discharge: Continued Medical Work up, SNF Pending bed offer  Expected Discharge Plan and Services Expected Discharge Plan: [place] arrangements for the past 2 months: Single Family Home                                       Social Determinants of Health (SDOH) Interventions    Readmission Risk Interventions Readmission Risk Prevention Plan 09/16/2020  Transportation Screening Complete  PCP or Specialist Appt within 3-5 Days Complete  HRI or Home Care Consult Complete  Social Work Consult for [place] Planning/Counseling Complete  Palliative Care Screening Not Applicable  Medication Review [employer]) Complete  Some recent data might be hidden

## 2021-07-22 NOTE — Evaluation (Signed)
Physical Therapy Evaluation Patient Details Name: Suzanne Avila MRN: [id number] DOB: 1946/07/02 Today's Date: 07/22/2021  History of Present Illness  Suzanne Avila is a 75 y.o. female with medical history significant for nicotine dependence, COPD, paroxysmal atrial fibrillation, hypertension, dyslipidemia who presents to the ER via EMS for evaluation of pain in her right thigh following a fall and is found to have a distal displaced and comminuted femoral diaphyseal fracture. Underwent R IM nailing on 9/28 with Dr. Rosita Kea.   Clinical Impression  Pt is a pleasant 75 year old female who presents to PT evaluation s/p day#1 R IM nailing and is currently PWB 25% on R LE. At baseline, pt reports being mainly independent with mobility with usage of rollator and/or SPC. Currently, pt is requiring modA + verbal cues for stand pivot transfers with RW due to confusion & decreased understanding of weightbearing restrictions. Pt demonstrating impaired strength/cognition/balance/mobility and will benefit from SNF to address impairments and improve independence. PT will benefit from skilled PT services to improve mobility and reduce risk of falls.     Recommendations for follow up therapy are one component of a multi-disciplinary discharge planning process, led by the attending physician.  Recommendations may be updated based on patient status, additional functional criteria and insurance authorization.  Follow Up Recommendations SNF    Equipment Recommendations  None recommended by PT    Recommendations for Other Services       Precautions / Restrictions Precautions Precautions: Fall Restrictions Weight Bearing Restrictions: Yes RLE Weight Bearing: Partial weight bearing Other Position/Activity Restrictions: PWB 25% only on R LE      Mobility  Bed Mobility               General bed mobility comments: Deferred, pt received in recliner chair    Transfers Overall transfer level: Needs  assistance Equipment used: Rolling walker (2 wheeled) Transfers: Stand Pivot Transfers   Stand pivot transfers: Mod assist       General transfer comment: MinA for standing from recliner and modA + verbal cues for pivot torwards BSC. Pt demonstrating difficulty maintaining WB precautions due to confusion.  Ambulation/Gait             General Gait Details: Deferred due to patient demonstrating difficulty with maintaining WB precautions  Stairs            Wheelchair Mobility    Modified Rankin (Stroke Patients Only)       Balance Overall balance assessment: History of Falls           Pertinent Vitals/Pain Pain Assessment: Faces Faces Pain Scale: Hurts little more Pain Location: R knee pain, not reporting any hip pain at this time Pain Descriptors / Indicators: Guarding;Grimacing Pain Intervention(s): Limited activity within patient's tolerance;Monitored during session;Premedicated before session;Repositioned;Utilized relaxation techniques    Home Living Family/patient expects to be discharged to:: Private residence Living Arrangements: Spouse/significant other;Children Available Help at Discharge: Family;Available 24 hours/day Type of Home: House Home Access: Level entry;Ramped entrance     Home Layout: One level Home Equipment: Bedside commode;Shower seat;Walker - 4 wheels;Walker - 2 wheels;Grab bars - tub/shower;Hand held shower head;Adaptive equipment;Cane - single point Additional Comments: Pt reports that her son assists her spouse during the day and that she was independent with usage of devices at baseline    Prior Function Level of Independence: Independent with assistive device(s)         Comments: IND with mobility using rollator in house and [facility] for longer distances.  No other falls reported in past 2 months.     Hand Dominance        Extremity/Trunk Assessment   Upper Extremity Assessment Upper Extremity Assessment: Defer to OT  evaluation    Lower Extremity Assessment Lower Extremity Assessment: Generalized weakness;RLE deficits/detail RLE Deficits / Details: Generalized weakness in R LE, difficult to assess secondary to pain. RLE: Unable to fully assess due to pain RLE Sensation: decreased light touch       Communication      Cognition Arousal/Alertness: Awake/alert Behavior During Therapy: WFL for tasks assessed/performed Overall Cognitive Status: No family/caregiver present to determine baseline cognitive functioning                                 General Comments: Some confusion regarding home set up and required additional time to answer other questions. Unsure if confusion is a result of medications or her baseline. Difficulty understanding WB precautions      General Comments      Exercises Other Exercises Other Exercises: Pt assisted with stand pivot transfer to bedside commode. Pt requiring maximal verbal cues for maintaining WB restrictions however, able to pivot with her L foot intermittently. Pt given additional time on BSC due to reports of needing a bowel movement. Pt required minA to stand + another person for pericare. Completed another stand pivot transfer towards L to recliner chair with +2 assistance for safety. Pt left seated in recliner chair with all needs within reach.   Assessment/Plan    PT Assessment Patient needs continued PT services  PT Problem List Decreased strength;Decreased range of motion;Decreased activity tolerance;Decreased balance;Decreased mobility;Decreased cognition;Decreased knowledge of use of DME;Decreased safety awareness;Decreased knowledge of precautions;Impaired sensation       PT Treatment Interventions DME instruction;Gait training;Functional mobility training;Stair training;Therapeutic exercise;Therapeutic activities;Balance training;Neuromuscular re-education;Cognitive remediation;Patient/family education;Wheelchair mobility training     PT Goals (Current goals can be found in the Care Plan section)  Acute Rehab PT Goals Patient Stated Goal: feel better and go back home PT Goal Formulation: With patient Time For Goal Achievement: 08/05/21 Potential to Achieve Goals: Fair    Frequency BID   Barriers to discharge Decreased caregiver support      Co-evaluation               AM-PAC PT "6 Clicks" Mobility  Outcome Measure Help needed turning from your back to your side while in a flat bed without using bedrails?: Total Help needed moving from lying on your back to sitting on the side of a flat bed without using bedrails?: A Lot Help needed moving to and from a bed to a chair (including a wheelchair)?: A Lot Help needed standing up from a chair using your arms (e.g., wheelchair or bedside chair)?: A Little Help needed to walk in hospital room?: Total Help needed climbing 3-5 steps with a railing? : Total 6 Click Score: 10    End of Session Equipment Utilized During Treatment: Gait belt Activity Tolerance: Patient limited by lethargy;Patient limited by pain Patient left: in chair;with call bell/phone within reach Nurse Communication: Mobility status PT Visit Diagnosis: Unsteadiness on feet (R26.81);Other abnormalities of gait and mobility (R26.89);Muscle weakness (generalized) (M62.81)    Time: 0300-9233 PT Time Calculation (min) (ACUTE ONLY): 44 min   Charges:   PT Evaluation $PT Eval Moderate Complexity: 1 Mod PT Treatments $Therapeutic Activity: 23-37 mins        Verl Blalock, SPT  Verl Blalock 07/22/2021, 2:10 PM

## 2021-07-22 NOTE — Plan of Care (Signed)

## 2021-07-22 NOTE — Progress Notes (Signed)
Progress Note    Suzanne Avila  CWC:[id number] DOB: Jan 12, 1946  DOA: 07/21/2021 PCP: Erasmo Downer, MD      Brief Narrative:    Medical records reviewed and are as summarized below:  Suzanne Avila is a 75 y.o. female with past medical history significant for paroxysmal atrial fibrillation, COPD, tobacco use disorder, hypertension, dyslipidemia, right lung abscess, left arm fracture, who presented to the hospital because of pain in the right thigh following a fall.  She was found to have comminuted right femur fracture.  She was treated with analgesics.  She underwent intramedullary retrograde femoral nailing of right femur fracture on 07/21/2021.    Assessment/Plan:   Principal Problem:   Femur fracture, right (HCC) Active Problems:   COPD (chronic obstructive pulmonary disease) (HCC)   Essential hypertension   Tobacco use disorder   AF (paroxysmal atrial fibrillation) (HCC)   Fall at home, initial encounter   AKI (acute kidney injury) (HCC)   Depression    Body mass index is 20.76 kg/m.  Comminuted closed right femur fracture s/p mechanical fall: S/p intramedullary retrograde femoral nailing on 07/21/2021.  Analgesics as needed for pain.  Follow-up with orthopedic surgeon.  Acute postoperative blood loss anemia, dizziness: Hemoglobin dropped from 10.3-7.2.  Transfuse 1 unit of PRBCs for symptomatic anemia.  AKI with hyperkalemia: Improved  Hyponatremia: Monitor BMP.  Paroxysmal atrial fibrillation: She is on Cardizem but she is not on long-term anticoagulation.  Outpatient follow-up with cardiologist.  COPD: Stable.  Continue bronchodilators.    Diet Order             Diet regular Room service appropriate? Yes; Fluid consistency: Thin  Diet effective now                      Consultants: Orthopedic surgeon  Procedures: S/p intramedullary retrograde femoral nailing on 07/21/2021    Medications:    arformoterol  15 mcg  Nebulization BID   And   umeclidinium bromide  1 puff Inhalation Daily   atorvastatin  40 mg Oral Daily   diltiazem  120 mg Oral Daily   docusate sodium  100 mg Oral BID   dorzolamide  1 drop Both Eyes BID   enoxaparin (LOVENOX) injection  30 mg Subcutaneous Daily   ferrous fumarate-b12-vitamic C-folic acid  1 capsule Oral BID   fluconazole  100 mg Oral Daily   latanoprost  1 drop Both Eyes QHS   magnesium gluconate  500 mg Oral Daily   magnesium hydroxide  30 mL Oral Daily   mirtazapine  7.5 mg Oral QHS   multivitamin with minerals  1 tablet Oral Daily   pantoprazole  40 mg Oral Daily   pregabalin  75 mg Oral BID   rOPINIRole  2 mg Oral BID   senna  1 tablet Oral BID   traMADol  50 mg Oral Q6H   vitamin B-12  500 mcg Oral Daily   Continuous Infusions:  sodium chloride 75 mL/hr at 07/21/21 1937   methocarbamol (ROBAXIN) IV       Anti-infectives (From admission, onward)    Start     Dose/Rate Route Frequency Ordered Stop   07/21/21 2100  ceFAZolin (ANCEF) IVPB 1 g/50 mL premix        1 g 100 mL/hr over 30 Minutes Intravenous Every 6 hours 07/21/21 1818 07/22/21 0940   07/21/21 1450  ceFAZolin (ANCEF) 1-4 GM/50ML-% IVPB       Note  to Pharmacy: Kerman Passey, Cryst: cabinet override      07/21/21 1450 07/21/21 1527   07/21/21 1430  ceFAZolin (ANCEF) IVPB 1 g/50 mL premix        1 g 100 mL/hr over 30 Minutes Intravenous To Surgery 07/21/21 0821 07/21/21 1548   07/21/21 1000  fluconazole (DIFLUCAN) tablet 100 mg        100 mg Oral Daily 07/21/21 0902                Family Communication/Anticipated D/C date and plan/Code Status   DVT prophylaxis: enoxaparin (LOVENOX) injection 30 mg Start: 07/22/21 1200 SCDs Start: 07/21/21 1819 Place TED hose Start: 07/21/21 1819 SCDs Start: 07/21/21 0902     Code Status: Full Code  Family Communication: None Disposition Plan:    Status is: Inpatient  Remains inpatient appropriate because:Inpatient level of care  appropriate due to severity of illness  Dispo: The patient is from: Home              Anticipated d/c is to: SNF              Patient currently is not medically stable to d/c.   Difficult to place patient No           Subjective:   C/o feeling fuzzy. Ally, PT, was at the bedside  Objective:    Vitals:   07/21/21 2030 07/21/21 2336 07/22/21 0523 07/22/21 0806  BP: (!) 151/69 (!) 161/83 (!) 143/70 (!) 143/73  Pulse: 98 89 78 80  Resp: 20 18 20 15   Temp: 99.2 F (37.3 C) 98 F (36.7 C) 98.9 F (37.2 C) 97.7 F (36.5 C)  TempSrc: Oral Oral    SpO2: 96% 98% 99% 97%  Weight:      Height:       No data found.   Intake/Output Summary (Last 24 hours) at 07/22/2021 1017 Last data filed at 07/21/2021 1734 Gross per 24 hour  Intake 1750 ml  Output 50 ml  Net 1700 ml   Filed Weights   07/21/21 0650 07/21/21 1422  Weight: 42 kg 42 kg    Exam:  GEN: NAD SKIN: Warm and dry EYES: Pale but anicteric ENT: MMM CV: RRR PULM: CTA B ABD: soft, ND, NT, +BS CNS: AAO x 3, non focal EXT: Right hip swelling. Dressing is clean, dry and intact        Data Reviewed:   I have personally reviewed following labs and imaging studies:  Labs: Labs show the following:   Basic Metabolic Panel: Recent Labs  Lab 07/21/21 0553 07/22/21 0601  NA 130* 131*  K 5.2* 4.9  CL 94* 101  CO2 22 24  GLUCOSE 103* 210*  BUN 38* 24*  CREATININE 1.47* 0.95  CALCIUM 8.7* 8.3*   GFR Estimated Creatinine Clearance: 28.9 mL/min (by C-G formula based on SCr of 0.95 mg/dL). Liver Function Tests: No results for input(s): AST, ALT, ALKPHOS, BILITOT, PROT, ALBUMIN in the last 168 hours. No results for input(s): LIPASE, AMYLASE in the last 168 hours. No results for input(s): AMMONIA in the last 168 hours. Coagulation profile Recent Labs  Lab 07/21/21 0553  INR 1.0    CBC: Recent Labs  Lab 07/21/21 0553 07/22/21 0601  WBC 10.8* 6.2  NEUTROABS 8.5*  --   HGB 10.3* 7.2*  HCT  30.5* 21.7*  MCV 81.1 82.2  PLT 289 257   Cardiac Enzymes: No results for input(s): CKTOTAL, CKMB, CKMBINDEX, TROPONINI in the last 168 hours.  BNP (last 3 results) No results for input(s): PROBNP in the last 8760 hours. CBG: No results for input(s): GLUCAP in the last 168 hours. D-Dimer: No results for input(s): DDIMER in the last 72 hours. Hgb A1c: No results for input(s): HGBA1C in the last 72 hours. Lipid Profile: No results for input(s): CHOL, HDL, LDLCALC, TRIG, CHOLHDL, LDLDIRECT in the last 72 hours. Thyroid function studies: No results for input(s): TSH, T4TOTAL, T3FREE, THYROIDAB in the last 72 hours.  Invalid input(s): FREET3 Anemia work up: No results for input(s): VITAMINB12, FOLATE, FERRITIN, TIBC, IRON, RETICCTPCT in the last 72 hours. Sepsis Labs: Recent Labs  Lab 07/21/21 0553 07/22/21 0601  WBC 10.8* 6.2    Microbiology Recent Results (from the past 240 hour(s))  Resp Panel by RT-PCR (Flu A&B, Covid) Nasopharyngeal Swab     Status: None   Collection Time: 07/21/21  5:53 AM   Specimen: Nasopharyngeal Swab; Nasopharyngeal(NP) swabs in vial transport medium  Result Value Ref Range Status   SARS Coronavirus 2 by RT PCR NEGATIVE NEGATIVE Final    Comment: (NOTE) SARS-CoV-2 target nucleic acids are NOT DETECTED.  The SARS-CoV-2 RNA is generally detectable in upper respiratory specimens during the acute phase of infection. The lowest concentration of SARS-CoV-2 viral copies this assay can detect is 138 copies/mL. A negative result does not preclude SARS-Cov-2 infection and should not be used as the sole basis for treatment or other patient management decisions. A negative result may occur with  improper specimen collection/handling, submission of specimen other than nasopharyngeal swab, presence of viral mutation(s) within the areas targeted by this assay, and inadequate number of viral copies(<138 copies/mL). A negative result must be combined  with clinical observations, patient history, and epidemiological information. The expected result is Negative.  Fact Sheet for Patients:  BloggerCourse.com  Fact Sheet for Healthcare Providers:  SeriousBroker.it  This test is no t yet approved or cleared by the [place] FDA and  has been authorized for detection and/or diagnosis of SARS-CoV-2 by FDA under an Emergency Use Authorization (EUA). This EUA will remain  in effect (meaning this test can be used) for the duration of the COVID-19 declaration under Section 564(b)(1) of the Act, 21 U.S.C.section 360bbb-3(b)(1), unless the authorization is terminated  or revoked sooner.       Influenza A by PCR NEGATIVE NEGATIVE Final   Influenza B by PCR NEGATIVE NEGATIVE Final    Comment: (NOTE) The Xpert Xpress SARS-CoV-2/FLU/RSV plus assay is intended as an aid in the diagnosis of influenza from Nasopharyngeal swab specimens and should not be used as a sole basis for treatment. Nasal washings and aspirates are unacceptable for Xpert Xpress SARS-CoV-2/FLU/RSV testing.  Fact Sheet for Patients: BloggerCourse.com  Fact Sheet for Healthcare Providers: SeriousBroker.it  This test is not yet approved or cleared by the [place] FDA and has been authorized for detection and/or diagnosis of SARS-CoV-2 by FDA under an Emergency Use Authorization (EUA). This EUA will remain in effect (meaning this test can be used) for the duration of the COVID-19 declaration under Section 564(b)(1) of the Act, 21 U.S.C. section 360bbb-3(b)(1), unless the authorization is terminated or revoked.  Performed at [facility], [address], [place], [place] [address]     Procedures and diagnostic studies:  DG Chest Portable 1 View  Result Date: 07/21/2021 CLINICAL DATA:  75 year old female under preoperative evaluation. History of COPD.  EXAM: PORTABLE CHEST 1 VIEW COMPARISON:  Chest x-ray 07/15/2020. FINDINGS: Linear area of scarring in the right  upper lobe at the site of the previously noted large cavitary lesion. Nodular density projecting over the lateral aspect of the left lower lung, favored to represent a prominent nipple shadow. Lungs are otherwise clear. Skin fold artifact in the lower left hemithorax. No definite pneumothorax. No evidence of pulmonary edema. No pleural effusions. Heart size is normal. Upper mediastinal contours are within normal limits. Atherosclerotic calcifications in the thoracic aorta. IMPRESSION: 1. No radiographic evidence of acute cardiopulmonary disease. 2. Chronic scarring in the right upper lobe at site of prior cavitary lesion. 3. Probable nipple shadow projecting over the lower left lung. Follow-up standing PA and lateral chest radiograph with nipple markers in place is recommended in the near future to better evaluate this finding. 4. Aortic atherosclerosis. Electronically Signed   By: Trudie Reed M.D.   On: 07/21/2021 07:03   DG C-Arm 1-60 Min  Result Date: 07/21/2021 CLINICAL DATA:  ORIF right femoral fracture EXAM: RIGHT FEMUR 2 VIEWS; DG C-ARM 1-60 MIN COMPARISON:  07/21/2021 FINDINGS: There are 13 fluoroscopic images obtained during the performance of the procedure and provided for interpretation only. Images demonstrate intramedullary rod with proximal and distal interlocking screws traversing the comminuted femoral fracture seen previously. No evidence of complication. Please refer to the operative report. FLUOROSCOPY TIME:  3 minutes 30 seconds IMPRESSION: 1. ORIF right femoral fracture. Electronically Signed   By: Sharlet Salina M.D.   On: 07/21/2021 18:33   DG Hip Unilat W or Wo Pelvis 2-3 Views Right  Result Date: 07/21/2021 CLINICAL DATA:  75 year old female with history of trauma from a fall with history of right-sided hip pain. EXAM: DG HIP (WITH OR WITHOUT PELVIS) 2-3V RIGHT  COMPARISON:  No priors. FINDINGS: AP view of the bony pelvis and AP and lateral views of the right hip demonstrate no acute displaced fracture of the bony pelvic ring. Right femoral head is located. Proximal right femur is intact (distal femoral fracture described separately). Areas of sclerosis are noted in the femoral heads bilaterally, suggesting avascular necrosis. There is also joint space narrowing, subchondral sclerosis, subchondral cyst formation and osteophyte formation in the hip joints bilaterally indicative of moderate bilateral hip joint osteoarthritis. Orthopedic fixation hardware in the lumbosacral spine incompletely imaged. IMPRESSION: 1. No acute radiographic abnormality of the bony pelvic ring or right hip. 2. Moderate bilateral hip joint osteoarthritis. 3. 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 4. Distal displaced and comminuted femoral diaphyseal fracture separately described. Electronically Signed   By: Trudie Reed M.D.   On: 07/21/2021 07:01   DG FEMUR, MIN 2 VIEWS RIGHT  Result Date: 07/21/2021 CLINICAL DATA:  ORIF right femoral fracture EXAM: RIGHT FEMUR 2 VIEWS; DG C-ARM 1-60 MIN COMPARISON:  07/21/2021 FINDINGS: There are 13 fluoroscopic images obtained during the performance of the procedure and provided for interpretation only. Images demonstrate intramedullary rod with proximal and distal interlocking screws traversing the comminuted femoral fracture seen previously. No evidence of complication. Please refer to the operative report. FLUOROSCOPY TIME:  3 minutes 30 seconds IMPRESSION: 1. ORIF right femoral fracture. Electronically Signed   By: Sharlet Salina M.D.   On: 07/21/2021 18:33   DG FEMUR PORT, MIN 2 VIEWS RIGHT  Result Date: 07/21/2021 CLINICAL DATA:  ORIF right femoral fracture EXAM: RIGHT FEMUR PORTABLE 2 VIEW COMPARISON:  07/21/2021 FINDINGS: Frontal and cross-table lateral views of the right femur are obtained. Intramedullary  rod with proximal and distal interlocking screws traverses a comminuted femoral diaphyseal fracture. Alignment is near anatomic. Postsurgical  changes are seen in the soft tissues. IMPRESSION: 1. ORIF comminuted femoral diaphyseal fracture with near anatomic alignment. Electronically Signed   By: Sharlet Salina M.D.   On: 07/21/2021 18:34   DG Femur Portable Min 2 Views Right  Result Date: 07/21/2021 CLINICAL DATA:  75 year old female with history of trauma from a fall. Right-sided leg pain. EXAM: RIGHT FEMUR PORTABLE 2 VIEW COMPARISON:  No priors. FINDINGS: Multiple views of the right femur demonstrate an acute displaced comminuted spiral fracture of the distal third of the femoral diaphysis with approximately one shaft width of posterior displacement of the distal fracture fragment. Large triangular-shaped separate fracture fragment also noted. Numerous vascular calcifications are noted. IMPRESSION: 1. Acute displaced comminuted spiral fracture of the distal third of the femoral diaphysis, as above. 2. Atherosclerosis. Electronically Signed   By: Trudie Reed M.D.   On: 07/21/2021 06:59               LOS: 1 day   Malone Admire  [facility]   Pager on [email or web address]. If 7PM-7AM, please contact night-coverage at [email or web address]     07/22/2021, 10:17 AM

## 2021-07-22 NOTE — Progress Notes (Signed)
   Subjective: 1 Day Post-Op Procedure(s) (LRB): INTRAMEDULLARY (IM) RETROGRADE FEMORAL NAILING (Right) Patient reports pain as moderate.   Patient is well, and has had no acute complaints or problems Denies any CP, SOB, ABD pain.  No dizziness, lightheadedness We will start physical therapy today.   Objective: Vital signs in last 24 hours: Temp:  [97.1 F (36.2 C)-99.2 F (37.3 C)] 97.7 F (36.5 C) (09/29 0806) Pulse Rate:  [76-98] 80 (09/29 0806) Resp:  [11-20] 15 (09/29 0806) BP: (126-161)/(53-99) 143/73 (09/29 0806) SpO2:  [93 %-100 %] 97 % (09/29 0806) Weight:  [42 kg] 42 kg (09/28 1422)  Intake/Output from previous day: 09/28 0701 - 09/29 0700 In: 2250 [P.O.:100; I.V.:2000; IV Piggyback:150] Out: 50 [Blood:50] Intake/Output this shift: No intake/output data recorded.  Recent Labs    07/21/21 0553 07/22/21 0601  HGB 10.3* 7.2*   Recent Labs    07/21/21 0553 07/22/21 0601  WBC 10.8* 6.2  RBC 3.76* 2.64*  HCT 30.5* 21.7*  PLT 289 257   Recent Labs    07/21/21 0553 07/22/21 0601  NA 130* 131*  K 5.2* 4.9  CL 94* 101  CO2 22 24  BUN 38* 24*  CREATININE 1.47* 0.95  GLUCOSE 103* 210*  CALCIUM 8.7* 8.3*   Recent Labs    07/21/21 0553  INR 1.0    EXAM General - Patient is Alert, Appropriate, and Oriented Extremity - Neurovascular intact Sensation intact distally Intact pulses distally Dorsiflexion/Plantar flexion intact Minimal swelling throughout the thigh Dressing - dressing C/D/I and no drainage, Ace wrap intact. Motor Function - intact, moving foot and toes well on exam.   Past Medical History:  Diagnosis Date   Closed left arm fracture    COPD (chronic obstructive pulmonary disease) (HCC)    Dehydration 09/15/2020   Glaucoma    Hyperlipidemia    Hypertension    Lung mass     Assessment/Plan:   1 Day Post-Op Procedure(s) (LRB): INTRAMEDULLARY (IM) RETROGRADE FEMORAL NAILING (Right) Principal Problem:   Femur fracture, right  (HCC) Active Problems:   COPD (chronic obstructive pulmonary disease) (HCC)   Essential hypertension   Tobacco use disorder   AF (paroxysmal atrial fibrillation) (HCC)   Fall at home, initial encounter   AKI (acute kidney injury) (HCC)   Depression  Estimated body mass index is 20.76 kg/m as calculated from the following:   Height as of this encounter: 4\' 8"  (1.422 m).   Weight as of this encounter: 42 kg. Advance diet Up with therapy, 25% weightbearing right lower extremity Acute postop blood loss anemia -hemoglobin 7.2.  Trending down.  Vital signs are stable patient not symptomatic at this time.  Recheck hemoglobin in the morning.  May likely need a transfusion if continuing to trend down and falls below 7.0. Vital signs are stable Pain controlled  Patient will need follow-up with [facility] in 2 weeks for x-rays and staple removal.   DVT Prophylaxis - Lovenox, TED hose, and SCDs Partial weightbearing right lower extremity 25%.  , PA-C [facility] 07/22/2021, 8:21 AM

## 2021-07-22 NOTE — Progress Notes (Signed)
Initial Nutrition Assessment  DOCUMENTATION CODES:  Severe malnutrition in context of chronic illness  INTERVENTION:  Continue regular diet.  Add Ensure Plus High Protein po BID, each supplement provides 350 kcal and 20 grams of protein.   Continue MVI daily.  NUTRITION DIAGNOSIS:  Severe Malnutrition related to chronic illness (COPD) as evidenced by severe fat depletion, severe muscle depletion, percent weight loss.  GOAL:  Patient will meet greater than or equal to 90% of their needs  MONITOR:  PO intake, Supplement acceptance, Labs, Weight trends, Skin, I & O's  REASON FOR ASSESSMENT:  Consult Hip fracture protocol  ASSESSMENT:  75 yo female with a PMH of nicotine dependence, previous EtOH abuse, COPD, paroxysmal atrial fibrillation, HTN, and dyslipidemia who presents with R femur fracture after a fall. 9/28 - R femur IM nail procedure  Spoke with pt at bedside. Pt very sleepy during interview, nodded in and out. Reported that she has been trying to eat well, but has been unable to. Did not give a reason.  Reports weight loss, but did not say more before falling asleep for the final time. Per Epic, pt has lost ~11 lbs (10.8%) in the past 7 months, which is significant and severe for the time frame.  Recommend adding Ensure BID.  Medications: reviewed; colace BID, Trinsicon MVI, Diflucan, Magonate, Milk of Magnesia, Remeron, Protonix, Senokot BID, Vitamin B12, NaCl @ 75 ml/hr  Labs: reviewed; Na 131 (L), Glucose 210 (H) BUN 24 (H - trending down)  NUTRITION - FOCUSED PHYSICAL EXAM: Flowsheet Row Most Recent Value  Orbital Region Moderate depletion  Upper Arm Region Severe depletion  Thoracic and Lumbar Region Moderate depletion  Buccal Region Severe depletion  Temple Region Moderate depletion  Clavicle Bone Region Moderate depletion  Clavicle and Acromion Bone Region Moderate depletion  Scapular Bone Region Unable to assess  Dorsal Hand Severe depletion  Patellar  Region Moderate depletion  Anterior Thigh Region Moderate depletion  Posterior Calf Region Severe depletion  Edema (RD Assessment) None  Hair Reviewed  Eyes Reviewed  Mouth Reviewed  Skin Reviewed  Nails Reviewed   Diet Order:   Diet Order             Diet regular Room service appropriate? Yes; Fluid consistency: Thin  Diet effective now                  EDUCATION NEEDS:  Not appropriate for education at this time  Skin:  Skin Assessment: Skin Integrity Issues: Skin Integrity Issues:: Incisions Incisions: R thigh, closed  Last BM:  07/21/21  Height:  Ht Readings from Last 1 Encounters:  07/21/21 4\' 8"  (1.422 m)   Weight:  Wt Readings from Last 1 Encounters:  07/21/21 42 kg   BMI:  Body mass index is 20.76 kg/m.  Estimated Nutritional Needs:  Kcal:  1500-1700 Protein:  65-80 grams Fluid:  >1.5 L  07/23/21, RD, LDN (she/her/hers) Registered Dietitian I After-Hours/Weekend Pager # in [place]

## 2021-07-23 LAB — BASIC METABOLIC PANEL
Anion gap: 6 (ref 5–15)
BUN: 22 mg/dL (ref 8–23)
CO2: 24 mmol/L (ref 22–32)
Calcium: 8.2 mg/dL — ABNORMAL LOW (ref 8.9–10.3)
Chloride: 103 mmol/L (ref 98–111)
Creatinine, Ser: 0.81 mg/dL (ref 0.44–1.00)
GFR, Estimated: >60 mL/min (ref >60)
Glucose, Bld: 106 mg/dL — ABNORMAL HIGH (ref 70–99)
Potassium: 4.8 mmol/L (ref 3.5–5.1)
Sodium: 133 mmol/L — ABNORMAL LOW (ref 135–145)

## 2021-07-23 LAB — CBC WITH DIFFERENTIAL/PLATELET
Abs Immature Granulocytes: 0.12 10*3/uL — ABNORMAL HIGH (ref 0.00–0.07)
Basophils Absolute: 0 10*3/uL (ref 0.0–0.1)
Basophils Relative: 0 %
Eosinophils Absolute: 0 10*3/uL (ref 0.0–0.5)
Eosinophils Relative: 0 %
HCT: 24.5 % — ABNORMAL LOW (ref 36.0–46.0)
Hemoglobin: 8.6 g/dL — ABNORMAL LOW (ref 12.0–15.0)
Immature Granulocytes: 1 %
Lymphocytes Relative: 10 %
Lymphs Abs: 1.1 10*3/uL (ref 0.7–4.0)
MCH: 27.8 pg (ref 26.0–34.0)
MCHC: 35.1 g/dL (ref 30.0–36.0)
MCV: 79.3 fL — ABNORMAL LOW (ref 80.0–100.0)
Monocytes Absolute: 1 10*3/uL (ref 0.1–1.0)
Monocytes Relative: 9 %
Neutro Abs: 8.8 10*3/uL — ABNORMAL HIGH (ref 1.7–7.7)
Neutrophils Relative %: 80 %
Platelets: 289 10*3/uL (ref 150–400)
RBC: 3.09 MIL/uL — ABNORMAL LOW (ref 3.87–5.11)
RDW: 18.5 % — ABNORMAL HIGH (ref 11.5–15.5)
WBC: 11 10*3/uL — ABNORMAL HIGH (ref 4.0–10.5)
nRBC: 0 % (ref 0.0–0.2)

## 2021-07-23 LAB — TYPE AND SCREEN
ABO/RH(D): B POS
Antibody Screen: NEGATIVE
Unit division: 0

## 2021-07-23 LAB — BPAM RBC
Blood Product Expiration Date: 202210082359
ISSUE DATE / TIME: 202209291346
Unit Type and Rh: 7300

## 2021-07-23 MED ORDER — TRAMADOL HCL 50 MG PO TABS: 50.0000 mg | ORAL_TABLET | Freq: Four times a day (QID) | ORAL | 0 refills | Status: AC | PRN

## 2021-07-23 MED ORDER — ENOXAPARIN SODIUM 40 MG/0.4ML IJ SOSY: 40.0000 mg | PREFILLED_SYRINGE | INTRAMUSCULAR | 0 refills | Status: AC

## 2021-07-23 NOTE — Care Management Important Message (Signed)
Important Message  Patient Details  Name: Suzanne Avila MRN: [id number] Date of Birth: 09-10-1946   Medicare Important Message Given:  Yes     Johnell Comings 07/23/2021, 1:09 PM

## 2021-07-23 NOTE — Progress Notes (Signed)
   Subjective: 2 Days Post-Op Procedure(s) (LRB): INTRAMEDULLARY (IM) RETROGRADE FEMORAL NAILING (Right) Patient reports pain as mild.   Patient is well, and has had no acute complaints or problems We will continue with physical therapy today.   Objective: Vital signs in last 24 hours: Temp:  [97.6 F (36.4 C)-98.8 F (37.1 C)] 98.2 F (36.8 C) (09/30 0730) Pulse Rate:  [61-83] 77 (09/30 0756) Resp:  [15-19] 16 (09/30 0756) BP: (122-158)/(50-71) 150/71 (09/30 0730) SpO2:  [94 %-99 %] 96 % (09/30 0756)  Intake/Output from previous day: 09/29 0701 - 09/30 0700 In: -  Out: 1 [Urine:1] Intake/Output this shift: No intake/output data recorded.  Recent Labs    07/21/21 0553 07/22/21 0601 07/23/21 0625  HGB 10.3* 7.2* 8.6*   Recent Labs    07/22/21 0601 07/23/21 0625  WBC 6.2 11.0*  RBC 2.64* 3.09*  HCT 21.7* 24.5*  PLT 257 289   Recent Labs    07/22/21 0601 07/23/21 0625  NA 131* 133*  K 4.9 4.8  CL 101 103  CO2 24 24  BUN 24* 22  CREATININE 0.95 0.81  GLUCOSE 210* 106*  CALCIUM 8.3* 8.2*   Recent Labs    07/21/21 0553  INR 1.0    EXAM General - Patient is Alert, Appropriate, and Oriented Extremity - Neurovascular intact Sensation intact distally Intact pulses distally Dorsiflexion/Plantar flexion intact Minimal swelling throughout the thigh Dressing - dressing C/D/I and no drainage, Ace wrap intact. Motor Function - intact, moving foot and toes well on exam.   Past Medical History:  Diagnosis Date   Closed left arm fracture    COPD (chronic obstructive pulmonary disease) (HCC)    Dehydration 09/15/2020   Glaucoma    Hyperlipidemia    Hypertension    Lung mass     Assessment/Plan:   2 Days Post-Op Procedure(s) (LRB): INTRAMEDULLARY (IM) RETROGRADE FEMORAL NAILING (Right) Principal Problem:   Femur fracture, right (HCC) Active Problems:   COPD (chronic obstructive pulmonary disease) (HCC)   Essential hypertension   Tobacco use  disorder   AF (paroxysmal atrial fibrillation) (HCC)   Fall at home, initial encounter   AKI (acute kidney injury) (HCC)   Depression  Estimated body mass index is 20.76 kg/m as calculated from the following:   Height as of this encounter: 4\' 8"  (1.422 m).   Weight as of this encounter: 42 kg. Advance diet Up with therapy, 25% weightbearing right lower extremity Acute postop blood loss anemia -hemoglobin 8.6, s/p 1 unit PRBC 9/29.  Vital signs are stable Pain controlled  Patient will need follow-up with [facility] in 2 weeks for x-rays and staple removal.   DVT Prophylaxis - Lovenox, TED hose, and SCDs Partial weightbearing right lower extremity 25%.  10/29, PA-C [facility] 07/23/2021, 8:22 AM

## 2021-07-23 NOTE — Progress Notes (Signed)
Progress Note    META KROENKE  PPI:[id number] DOB: [birth year]  DOA: 07/21/2021 PCP: Erasmo Downer, MD      Brief Narrative:    Medical records reviewed and are as summarized below:  Suzanne Avila is a 75 y.o. female with past medical history significant for paroxysmal atrial fibrillation, COPD, tobacco use disorder, hypertension, dyslipidemia, right lung abscess, left arm fracture, who presented to the hospital because of pain in the right thigh following a fall.  She was found to have comminuted right femur fracture.  She was treated with analgesics.  She underwent intramedullary retrograde femoral nailing of right femur fracture on 07/21/2021.    Assessment/Plan:   Principal Problem:   Femur fracture, right (HCC) Active Problems:   COPD (chronic obstructive pulmonary disease) (HCC)   Essential hypertension   Tobacco use disorder   AF (paroxysmal atrial fibrillation) (HCC)   Fall at home, initial encounter   AKI (acute kidney injury) (HCC)   Depression    Body mass index is 20.76 kg/m.  Comminuted closed right femur fracture s/p mechanical fall: S/p intramedullary retrograde femoral nailing on 07/21/2021.  Continue analgesics as needed for pain.  Follow-up with orthopedic surgeon as an outpatient.  Acute postoperative blood loss anemia, dizziness: Dizziness is improved.  H&H improved s/p transfusion with 1 unit of PRBCs on 07/22/2021.    AKI with hyperkalemia: Resolved  Hyponatremia: Monitor BMP.  Paroxysmal atrial fibrillation: She is on Cardizem but she is not on long-term anticoagulation.  Outpatient follow-up with cardiologist.  COPD: Stable.  Continue bronchodilators.    Diet Order             Diet regular Room service appropriate? Yes; Fluid consistency: Thin  Diet effective now                      Consultants: Orthopedic surgeon  Procedures: S/p intramedullary retrograde femoral nailing on 07/21/2021    Medications:     arformoterol  15 mcg Nebulization BID   And   umeclidinium bromide  1 puff Inhalation Daily   atorvastatin  40 mg Oral Daily   diltiazem  120 mg Oral Daily   docusate sodium  100 mg Oral BID   dorzolamide  1 drop Both Eyes BID   enoxaparin (LOVENOX) injection  30 mg Subcutaneous Daily   feeding supplement  237 mL Oral BID BM   ferrous fumarate-b12-vitamic C-folic acid  1 capsule Oral BID   fluconazole  100 mg Oral Daily   latanoprost  1 drop Both Eyes QHS   magnesium gluconate  500 mg Oral Daily   magnesium hydroxide  30 mL Oral Daily   mirtazapine  7.5 mg Oral QHS   multivitamin with minerals  1 tablet Oral Daily   pantoprazole  40 mg Oral Daily   pregabalin  75 mg Oral BID   rOPINIRole  2 mg Oral BID   senna  1 tablet Oral BID   traMADol  50 mg Oral Q6H   vitamin B-12  500 mcg Oral Daily   Continuous Infusions:  methocarbamol (ROBAXIN) IV       Anti-infectives (From admission, onward)    Start     Dose/Rate Route Frequency Ordered Stop   07/21/21 2100  ceFAZolin (ANCEF) IVPB 1 g/50 mL premix        1 g 100 mL/hr over 30 Minutes Intravenous Every 6 hours 07/21/21 1818 07/22/21 0940   07/21/21 1450  ceFAZolin (ANCEF) 1-4  GM/50ML-% IVPB       Note to Pharmacy: Kerman Passey, Cryst: cabinet override      07/21/21 1450 07/21/21 1527   07/21/21 1430  ceFAZolin (ANCEF) IVPB 1 g/50 mL premix        1 g 100 mL/hr over 30 Minutes Intravenous To Surgery 07/21/21 0821 07/21/21 1548   07/21/21 1000  fluconazole (DIFLUCAN) tablet 100 mg        100 mg Oral Daily 07/21/21 0902                Family Communication/Anticipated D/C date and plan/Code Status   DVT prophylaxis: enoxaparin (LOVENOX) injection 30 mg Start: 07/22/21 1200 SCDs Start: 07/21/21 1819 Place TED hose Start: 07/21/21 1819 SCDs Start: 07/21/21 0902     Code Status: Full Code  Family Communication: None Disposition Plan:    Status is: Inpatient  Remains inpatient appropriate  because:Inpatient level of care appropriate due to severity of illness  Dispo: The patient is from: Home              Anticipated d/c is to: SNF              Patient currently is not medically stable to d/c.   Difficult to place patient No           Subjective:   Interval events noted.  She complains of pain in the right lower extremity with movement.  No dizziness, shortness of breath or chest pain.  His nurse was at the bedside.  Objective:    Vitals:   07/23/21 0532 07/23/21 0730 07/23/21 0756 07/23/21 1121  BP: (!) 149/71 (!) 150/71  (!) 130/59  Pulse: 83 80 77 69  Resp: 17 15 16 15   Temp: 98 F (36.7 C) 98.2 F (36.8 C)  98.2 F (36.8 C)  TempSrc:      SpO2: 97% 95% 96% 94%  Weight:      Height:       No data found.   Intake/Output Summary (Last 24 hours) at 07/23/2021 1218 Last data filed at 07/23/2021 1023 Gross per 24 hour  Intake 240 ml  Output 1 ml  Net 239 ml   Filed Weights   07/21/21 0650 07/21/21 1422  Weight: 42 kg 42 kg    Exam:  GEN: NAD SKIN: No rash EYES: No pallor or icterus ENT: MMM CV: RRR PULM: CTA B ABD: soft, ND, NT, +BS CNS: AAO x 3, non focal EXT: Right hip swelling.  Dressing on right thigh and leg is clean, dry and intact.         Data Reviewed:   I have personally reviewed following labs and imaging studies:  Labs: Labs show the following:   Basic Metabolic Panel: Recent Labs  Lab 07/21/21 0553 07/22/21 0601 07/23/21 0625  NA 130* 131* 133*  K 5.2* 4.9 4.8  CL 94* 101 103  CO2 22 24 24   GLUCOSE 103* 210* 106*  BUN 38* 24* 22  CREATININE 1.47* 0.95 0.81  CALCIUM 8.7* 8.3* 8.2*   GFR Estimated Creatinine Clearance: 33.9 mL/min (by C-G formula based on SCr of 0.81 mg/dL). Liver Function Tests: No results for input(s): AST, ALT, ALKPHOS, BILITOT, PROT, ALBUMIN in the last 168 hours. No results for input(s): LIPASE, AMYLASE in the last 168 hours. No results for input(s): AMMONIA in the last 168  hours. Coagulation profile Recent Labs  Lab 07/21/21 0553  INR 1.0    CBC: Recent Labs  Lab 07/21/21 0553 07/22/21  0601 07/23/21 0625  WBC 10.8* 6.2 11.0*  NEUTROABS 8.5*  --  8.8*  HGB 10.3* 7.2* 8.6*  HCT 30.5* 21.7* 24.5*  MCV 81.1 82.2 79.3*  PLT 289 257 289   Cardiac Enzymes: No results for input(s): CKTOTAL, CKMB, CKMBINDEX, TROPONINI in the last 168 hours. BNP (last 3 results) No results for input(s): PROBNP in the last 8760 hours. CBG: No results for input(s): GLUCAP in the last 168 hours. D-Dimer: No results for input(s): DDIMER in the last 72 hours. Hgb A1c: No results for input(s): HGBA1C in the last 72 hours. Lipid Profile: No results for input(s): CHOL, HDL, LDLCALC, TRIG, CHOLHDL, LDLDIRECT in the last 72 hours. Thyroid function studies: No results for input(s): TSH, T4TOTAL, T3FREE, THYROIDAB in the last 72 hours.  Invalid input(s): FREET3 Anemia work up: No results for input(s): VITAMINB12, FOLATE, FERRITIN, TIBC, IRON, RETICCTPCT in the last 72 hours. Sepsis Labs: Recent Labs  Lab 07/21/21 0553 07/22/21 0601 07/23/21 0625  WBC 10.8* 6.2 11.0*    Microbiology Recent Results (from the past 240 hour(s))  Resp Panel by RT-PCR (Flu A&B, Covid) Nasopharyngeal Swab     Status: None   Collection Time: 07/21/21  5:53 AM   Specimen: Nasopharyngeal Swab; Nasopharyngeal(NP) swabs in vial transport medium  Result Value Ref Range Status   SARS Coronavirus 2 by RT PCR NEGATIVE NEGATIVE Final    Comment: (NOTE) SARS-CoV-2 target nucleic acids are NOT DETECTED.  The SARS-CoV-2 RNA is generally detectable in upper respiratory specimens during the acute phase of infection. The lowest concentration of SARS-CoV-2 viral copies this assay can detect is 138 copies/mL. A negative result does not preclude SARS-Cov-2 infection and should not be used as the sole basis for treatment or other patient management decisions. A negative result may occur with  improper  specimen collection/handling, submission of specimen other than nasopharyngeal swab, presence of viral mutation(s) within the areas targeted by this assay, and inadequate number of viral copies(<138 copies/mL). A negative result must be combined with clinical observations, patient history, and epidemiological information. The expected result is Negative.  Fact Sheet for Patients:  BloggerCourse.com  Fact Sheet for Healthcare Providers:  SeriousBroker.it  This test is no t yet approved or cleared by the [place] FDA and  has been authorized for detection and/or diagnosis of SARS-CoV-2 by FDA under an Emergency Use Authorization (EUA). This EUA will remain  in effect (meaning this test can be used) for the duration of the COVID-19 declaration under Section 564(b)(1) of the Act, 21 U.S.C.section 360bbb-3(b)(1), unless the authorization is terminated  or revoked sooner.       Influenza A by PCR NEGATIVE NEGATIVE Final   Influenza B by PCR NEGATIVE NEGATIVE Final    Comment: (NOTE) The Xpert Xpress SARS-CoV-2/FLU/RSV plus assay is intended as an aid in the diagnosis of influenza from Nasopharyngeal swab specimens and should not be used as a sole basis for treatment. Nasal washings and aspirates are unacceptable for Xpert Xpress SARS-CoV-2/FLU/RSV testing.  Fact Sheet for Patients: BloggerCourse.com  Fact Sheet for Healthcare Providers: SeriousBroker.it  This test is not yet approved or cleared by the [place] FDA and has been authorized for detection and/or diagnosis of SARS-CoV-2 by FDA under an Emergency Use Authorization (EUA). This EUA will remain in effect (meaning this test can be used) for the duration of the COVID-19 declaration under Section 564(b)(1) of the Act, 21 U.S.C. section 360bbb-3(b)(1), unless the authorization is terminated or revoked.  Performed at  [facility]  Lab, [address] [place], [place] [address]     Procedures and diagnostic studies:  DG C-Arm 1-60 Min  Result Date: 07/21/2021 CLINICAL DATA:  ORIF right femoral fracture EXAM: RIGHT FEMUR 2 VIEWS; DG C-ARM 1-60 MIN COMPARISON:  07/21/2021 FINDINGS: There are 13 fluoroscopic images obtained during the performance of the procedure and provided for interpretation only. Images demonstrate intramedullary rod with proximal and distal interlocking screws traversing the comminuted femoral fracture seen previously. No evidence of complication. Please refer to the operative report. FLUOROSCOPY TIME:  3 minutes 30 seconds IMPRESSION: 1. ORIF right femoral fracture. Electronically Signed   By: Sharlet Salina M.D.   On: 07/21/2021 18:33   DG FEMUR, MIN 2 VIEWS RIGHT  Result Date: 07/21/2021 CLINICAL DATA:  ORIF right femoral fracture EXAM: RIGHT FEMUR 2 VIEWS; DG C-ARM 1-60 MIN COMPARISON:  07/21/2021 FINDINGS: There are 13 fluoroscopic images obtained during the performance of the procedure and provided for interpretation only. Images demonstrate intramedullary rod with proximal and distal interlocking screws traversing the comminuted femoral fracture seen previously. No evidence of complication. Please refer to the operative report. FLUOROSCOPY TIME:  3 minutes 30 seconds IMPRESSION: 1. ORIF right femoral fracture. Electronically Signed   By: Sharlet Salina M.D.   On: 07/21/2021 18:33   DG FEMUR PORT, MIN 2 VIEWS RIGHT  Result Date: 07/21/2021 CLINICAL DATA:  ORIF right femoral fracture EXAM: RIGHT FEMUR PORTABLE 2 VIEW COMPARISON:  07/21/2021 FINDINGS: Frontal and cross-table lateral views of the right femur are obtained. Intramedullary rod with proximal and distal interlocking screws traverses a comminuted femoral diaphyseal fracture. Alignment is near anatomic. Postsurgical changes are seen in the soft tissues. IMPRESSION: 1. ORIF comminuted femoral diaphyseal fracture with near  anatomic alignment. Electronically Signed   By: Sharlet Salina M.D.   On: 07/21/2021 18:34               LOS: 2 days   Tevion Laforge  [facility]   Pager on [email or web address]. If 7PM-7AM, please contact night-coverage at [email or web address]     07/23/2021, 12:18 PM

## 2021-07-23 NOTE — Progress Notes (Signed)
Physical Therapy Treatment Patient Details Name: Suzanne Avila MRN: [id number] DOB: [birth year] Today's Date: 07/23/2021   History of Present Illness Suzanne Avila is a 75 y.o. female with medical history significant for nicotine dependence, COPD, paroxysmal atrial fibrillation, hypertension, dyslipidemia who presents to the ER via EMS for evaluation of pain in her right thigh following a fall and is found to have a distal displaced and comminuted femoral diaphyseal fracture. Underwent R IM nailing on 9/28 with Dr. Rosita Kea.    PT Comments    Pt seen this am, received up in chair, agreeable to PT session.  Pt able to raise to standing from recliner with MinA. Gait training with RW and CGA ~10 feet with repeated vc's for wt bearing compliance. Therapist limited gait distance due to pt unable to ambulate without placing more than 25% through R LE.  Pt however did not c/o pain with mobility and could have tolerated further distance if able to comply with restrictions. Continue to recommend SNF upon d/c.    Recommendations for follow up therapy are one component of a multi-disciplinary discharge planning process, led by the attending physician.  Recommendations may be updated based on patient status, additional functional criteria and insurance authorization.  Follow Up Recommendations  SNF     Equipment Recommendations  None recommended by PT    Recommendations for Other Services       Precautions / Restrictions Precautions Precautions: Fall Restrictions Weight Bearing Restrictions: Yes RLE Weight Bearing: Partial weight bearing RLE Partial Weight Bearing Percentage or Pounds: 25%     Mobility  Bed Mobility                    Transfers Overall transfer level: Needs assistance Equipment used: Rolling walker (2 wheeled) Transfers: Sit to/from Stand Sit to Stand: Min assist         General transfer comment:  (verbal cues for hand and feet placement, as well as weight  bearing restrictions on R LE)  Ambulation/Gait Ambulation/Gait assistance: Min guard Gait Distance (Feet): 10 Feet Assistive device: Rolling walker (2 wheeled) Gait Pattern/deviations: Step-through pattern;Decreased weight shift to right     General Gait Details:  (Repeated vc's for wt bearing compliance)   Stairs             Wheelchair Mobility    Modified Rankin (Stroke Patients Only)       Balance                                            Cognition Arousal/Alertness: Awake/alert Behavior During Therapy: WFL for tasks assessed/performed Overall Cognitive Status: No family/caregiver present to determine baseline cognitive functioning                                 General Comments: Continues to have some trouble with recall and requiring additional time to answer questions      Exercises Total Joint Exercises Ankle Circles/Pumps: AROM;Strengthening;Both;20 reps;Other (comment) Quad Sets: AROM;Strengthening;Right;10 reps;Other (comment) Gluteal Sets: AROM;Strengthening;Both;10 reps;Other (comment) Short Arc Quad: AROM;Strengthening;Right;10 reps;Other (comment)    General Comments General comments (skin integrity, edema, etc.):  (R lower leg ace wrap intact)      Pertinent Vitals/Pain Pain Assessment: No/denies pain Pain Score: 4  Pain Location: R knee pain Pain Descriptors / Indicators: Guarding;Grimacing Pain  Intervention(s): Monitored during session    Home Living                      Prior Function            PT Goals (current goals can now be found in the care plan section) Acute Rehab PT Goals Patient Stated Goal: feel better and go back home    Frequency    BID      PT Plan Current plan remains appropriate    Co-evaluation              AM-PAC PT "6 Clicks" Mobility   Outcome Measure  Help needed turning from your back to your side while in a flat bed without using bedrails?:  Total Help needed moving from lying on your back to sitting on the side of a flat bed without using bedrails?: A Lot Help needed moving to and from a bed to a chair (including a wheelchair)?: A Lot Help needed standing up from a chair using your arms (e.g., wheelchair or bedside chair)?: A Little Help needed to walk in hospital room?: Total Help needed climbing 3-5 steps with a railing? : Total 6 Click Score: 10    End of Session Equipment Utilized During Treatment: Gait belt Activity Tolerance: Patient limited by fatigue Patient left: in chair;with call bell/phone within reach;with chair alarm set Nurse Communication: Mobility status PT Visit Diagnosis: Unsteadiness on feet (R26.81);Other abnormalities of gait and mobility (R26.89);Muscle weakness (generalized) (M62.81)     Time: 4401-0272 PT Time Calculation (min) (ACUTE ONLY): 36 min  Charges:  $Gait Training: 8-22 mins $Therapeutic Exercise: 8-22 mins                    Zadie Cleverly, PTA   Jannet Askew 07/23/2021, 10:32 AM

## 2021-07-23 NOTE — Progress Notes (Signed)
Physical Therapy Treatment Patient Details Name: Suzanne Avila MRN: [id number] DOB: 1945-12-06 Today's Date: 07/23/2021   History of Present Illness Suzanne Avila is a 75 y.o. female with medical history significant for nicotine dependence, COPD, paroxysmal atrial fibrillation, hypertension, dyslipidemia who presents to the ER via EMS for evaluation of pain in her right thigh following a fall and is found to have a distal displaced and comminuted femoral diaphyseal fracture. Underwent R IM nailing on 9/28 with Dr. Rosita Kea.    PT Comments    Pt seen this pm for transfer training with support of RW and MinA for safe technique, pt can be a bit impulsive.  Pt educated again on importance of maintaining wt bearing restriction through R LE to only 25% due to significant trauma caused to R distal femur from fall. Reviewed exercise program again with good understanding. Pt is in agreement with SNF placement upon d/c.    Recommendations for follow up therapy are one component of a multi-disciplinary discharge planning process, led by the attending physician.  Recommendations may be updated based on patient status, additional functional criteria and insurance authorization.  Follow Up Recommendations  SNF     Equipment Recommendations  None recommended by PT    Recommendations for Other Services       Precautions / Restrictions Precautions Precautions: Fall Restrictions Weight Bearing Restrictions: Yes RLE Weight Bearing: Touchdown weight bearing RLE Partial Weight Bearing Percentage or Pounds: 25% Other Position/Activity Restrictions: PWB 25% only on R LE     Mobility  Bed Mobility               General bed mobility comments: Deferred, pt received in recliner chair    Transfers Overall transfer level: Needs assistance Equipment used: Rolling walker (2 wheeled) Transfers: Sit to/from Stand Sit to Stand: Min assist Stand pivot transfers: Min assist;Mod assist (for safe  technique)       General transfer comment:  (Pt completed several transfers from various levels this session. Cues given for safety and to focus on maintining wt restrictions through R LE)  Ambulation/Gait                 Stairs             Wheelchair Mobility    Modified Rankin (Stroke Patients Only)       Balance Overall balance assessment: History of Falls;Needs assistance Sitting-balance support: No upper extremity supported;Feet supported;Single extremity supported Sitting balance-Leahy Scale: Good     Standing balance support: Bilateral upper extremity supported Standing balance-Leahy Scale: Poor Standing balance comment: requires UE vs BUE support on RW                            Cognition Arousal/Alertness: Awake/alert Behavior During Therapy: WFL for tasks assessed/performed Overall Cognitive Status: Within Functional Limits for tasks assessed                                 General Comments:  (Pt motivated to participate and improve with PT)      Exercises Total Joint Exercises Ankle Circles/Pumps: AROM;Strengthening;Both;20 reps;Other (comment) Gluteal Sets: AROM;Strengthening;Both;10 reps;Other (comment) Other Exercises Other Exercises: Pt instructed in compression stockings mgt with poor recall from previous date when prompted and reports "my son will just do it"    General Comments General comments (skin integrity, edema, etc.): Pt agreed to go to  SNF for short term rehab upon d/c.      Pertinent Vitals/Pain Pain Assessment: 0-10 Pain Score: 3  Pain Location: R knee pain Pain Descriptors / Indicators: Guarding;Grimacing Pain Intervention(s): Monitored during session;RN gave pain meds during session    Home Living                      Prior Function            PT Goals (current goals can now be found in the care plan section) Acute Rehab PT Goals Patient Stated Goal: feel better and go back  home Progress towards PT goals: Progressing toward goals    Frequency    BID      PT Plan Current plan remains appropriate    Co-evaluation              AM-PAC PT "6 Clicks" Mobility   Outcome Measure  Help needed turning from your back to your side while in a flat bed without using bedrails?: Total Help needed moving from lying on your back to sitting on the side of a flat bed without using bedrails?: A Lot Help needed moving to and from a bed to a chair (including a wheelchair)?: A Lot Help needed standing up from a chair using your arms (e.g., wheelchair or bedside chair)?: A Little Help needed to walk in hospital room?: Total Help needed climbing 3-5 steps with a railing? : Total 6 Click Score: 10    End of Session Equipment Utilized During Treatment: Gait belt Activity Tolerance: Patient tolerated treatment well Patient left: in chair;with call bell/phone within reach;with chair alarm set Nurse Communication: Mobility status (Large BM while on commode) PT Visit Diagnosis: Unsteadiness on feet (R26.81);Other abnormalities of gait and mobility (R26.89);Muscle weakness (generalized) (M62.81)     Time: 3704-8889 PT Time Calculation (min) (ACUTE ONLY): 24 min  Charges:  $Therapeutic Activity: 23-37 mins                    Zadie Cleverly, PTA    Jannet Askew 07/23/2021, 2:38 PM

## 2021-07-23 NOTE — Plan of Care (Signed)
No acute events during the night. VSS. Patient made slight changes to reposition herself during the night, but refused q 2 hour  turns. Dressing to RLE remains intact. Normal neurovascular checks.  Problem: Education: Goal: Knowledge of General Education information will improve Description: Including pain rating scale, medication(s)/side effects and non-pharmacologic comfort measures Outcome: Progressing   Problem: Health Behavior/Discharge Planning: Goal: Ability to manage health-related needs will improve Outcome: Progressing   Problem: Clinical Measurements: Goal: Ability to maintain clinical measurements within normal limits will improve Outcome: Progressing Goal: Will remain free from infection Outcome: Progressing Goal: Diagnostic test results will improve Outcome: Progressing Goal: Respiratory complications will improve Outcome: Progressing Goal: Cardiovascular complication will be avoided Outcome: Progressing   Problem: Activity: Goal: Risk for activity intolerance will decrease Outcome: Progressing   Problem: Nutrition: Goal: Adequate nutrition will be maintained Outcome: Progressing   Problem: Coping: Goal: Level of anxiety will decrease Outcome: Progressing   Problem: Elimination: Goal: Will not experience complications related to bowel motility Outcome: Progressing Goal: Will not experience complications related to urinary retention Outcome: Progressing   Problem: Pain Managment: Goal: General experience of comfort will improve Outcome: Progressing   Problem: Safety: Goal: Ability to remain free from injury will improve Outcome: Progressing   Problem: Skin Integrity: Goal: Risk for impaired skin integrity will decrease Outcome: Progressing   Problem: Education: Goal: Verbalization of understanding the information provided (i.e., activity precautions, restrictions, etc) will improve Outcome: Progressing Goal: Individualized Educational  Video(s) Outcome: Progressing   Problem: Activity: Goal: Ability to ambulate and perform ADLs will improve Outcome: Progressing   Problem: Clinical Measurements: Goal: Postoperative complications will be avoided or minimized Outcome: Progressing   Problem: Self-Concept: Goal: Ability to maintain and perform role responsibilities to the fullest extent possible will improve Outcome: Progressing   Problem: Pain Management: Goal: Pain level will decrease Outcome: Progressing

## 2021-07-23 NOTE — Progress Notes (Signed)
Occupational Therapy Treatment Patient Details Name: Suzanne Avila MRN: [id number] DOB: August 26, 1946 Today's Date: 07/23/2021   History of present illness Suzanne Avila is a 75 y.o. female with medical history significant for nicotine dependence, COPD, paroxysmal atrial fibrillation, hypertension, dyslipidemia who presents to the ER via EMS for evaluation of pain in her right thigh following a fall and is found to have a distal displaced and comminuted femoral diaphyseal fracture. Underwent R IM nailing on 9/28 with Dr. Rosita Kea.   OT comments  Pt seen for OT tx. Pt received sitting in recliner, sleeping, and wakes to gentle verbal cues. Pt becomes alert and oriented x4 grossly but continues to demonstrate some impaired recall, processing time, comprehension of session. Pt reports recalling therapist from previous session but unable to recall any previous instruction given. Pt instructed in compression stocking mgt. When asked about teach back to ensure learning, pt repeatedly just reports "my son will figure it out." Pt instructed in AE for LB dressing with verbal instruction paired with demonstration. With encouragement, pt trialed herself requiring min cues for technique/sequencing. Pt again reporting she will just have her son assist. Pt continues to benefit from skilled OT services. Continue to recommend SNF at this time.    Recommendations for follow up therapy are one component of a multi-disciplinary discharge planning process, led by the attending physician.  Recommendations may be updated based on patient status, additional functional criteria and insurance authorization.    Follow Up Recommendations  SNF    Equipment Recommendations  3 in 1 bedside commode    Recommendations for Other Services      Precautions / Restrictions Precautions Precautions: Fall Restrictions Weight Bearing Restrictions: Yes RLE Weight Bearing: Partial weight bearing RLE Partial Weight Bearing Percentage  or Pounds: 25%       Mobility Bed Mobility               General bed mobility comments: Deferred, pt received in recliner chair    Transfers Overall transfer level: Needs assistance Equipment used: Rolling walker (2 wheeled) Transfers: Sit to/from Stand Sit to Stand: Min assist         General transfer comment:  (verbal cues for hand and feet placement, as well as weight bearing restrictions on R LE)    Balance Overall balance assessment: History of Falls;Needs assistance Sitting-balance support: No upper extremity supported;Feet supported;Single extremity supported Sitting balance-Leahy Scale: Good     Standing balance support: Bilateral upper extremity supported Standing balance-Leahy Scale: Poor Standing balance comment: requires UE vs BUE support on RW                           ADL either performed or assessed with clinical judgement   ADL Overall ADL's : Needs assistance/impaired                       Lower Body Dressing Details (indicate cue type and reason): pt instructed in LB dressing strategies using AE to improve independence and minimize pain. After initial instruction and demonstration, pt able to trial reacher for threading clothing over BLE with intermittent VC for technique. Pt requires MOD A to complete over hips in standing 2/2 decreased balance with PWBing to RLE                     Vision       Perception     Praxis  Cognition Arousal/Alertness: Awake/alert Behavior During Therapy: WFL for tasks assessed/performed Overall Cognitive Status: No family/caregiver present to determine baseline cognitive functioning                                 General Comments: some continued difficulties with problem solving, response times, and safety awareness        Exercises Other Exercises: Pt instructed in compression stockings mgt with poor recall from previous date when prompted and reports "my son  will just do it"   Shoulder Instructions       General Comments  (R lower leg ace wrap intact)    Pertinent Vitals/ Pain       Pain Assessment: No/denies pain Pain Score: 4  Pain Location: R knee pain Pain Descriptors / Indicators: Guarding;Grimacing Pain Intervention(s): Monitored during session  Home Living                                          Prior Functioning/Environment              Frequency  Min 2X/week        Progress Toward Goals  OT Goals(current goals can now be found in the care plan section)  Progress towards OT goals: OT to reassess next treatment  Acute Rehab OT Goals Patient Stated Goal: feel better and go back home OT Goal Formulation: With patient Time For Goal Achievement: 08/05/21 Potential to Achieve Goals: Good  Plan Discharge plan remains appropriate;Frequency remains appropriate    Co-evaluation                 AM-PAC OT "6 Clicks" Daily Activity     Outcome Measure   Help from another person eating meals?: None Help from another person taking care of personal grooming?: None Help from another person toileting, which includes using toliet, bedpan, or urinal?: A Lot Help from another person bathing (including washing, rinsing, drying)?: A Lot Help from another person to put on and taking off regular upper body clothing?: None Help from another person to put on and taking off regular lower body clothing?: A Lot 6 Click Score: 18    End of Session    OT Visit Diagnosis: Other abnormalities of gait and mobility (R26.89);History of falling (Z91.81);Muscle weakness (generalized) (M62.81);Pain Pain - Right/Left: Right Pain - part of body: Hip;Knee;Leg   Activity Tolerance Patient tolerated treatment well;Other (comment) (still a bit confused)   Patient Left in chair;with call bell/phone within reach;with chair alarm set   Nurse Communication          Time: 2440-1027 OT Time Calculation (min): 12  min  Charges: OT General Charges $OT Visit: 1 Visit OT Treatments $Self Care/Home Management : 8-22 mins  Arman Filter., MPH, MS, OTR/L ascom [phone] 07/23/21, 1:52 PM

## 2021-07-23 NOTE — Plan of Care (Signed)

## 2021-07-24 MED ORDER — TORSEMIDE 20 MG PO TABS
40.0000 mg | ORAL_TABLET | Freq: Two times a day (BID) | ORAL | Status: DC
  Administered 2021-07-24 – 2021-07-26 (×5): 40 mg via ORAL
  Filled 2021-07-24 (×5): qty 2

## 2021-07-24 MED ORDER — RAMIPRIL 2.5 MG PO CAPS
2.5000 mg | ORAL_CAPSULE | Freq: Every day | ORAL | Status: DC
  Administered 2021-07-24 – 2021-07-26 (×3): 2.5 mg via ORAL
  Filled 2021-07-24 (×3): qty 1

## 2021-07-24 MED ORDER — SPIRONOLACTONE 25 MG PO TABS
25.0000 mg | ORAL_TABLET | Freq: Every day | ORAL | Status: DC
  Administered 2021-07-24 – 2021-07-26 (×3): 25 mg via ORAL
  Filled 2021-07-24 (×3): qty 1

## 2021-07-24 NOTE — Progress Notes (Signed)
Physical Therapy Treatment Patient Details Name: Suzanne Avila MRN: [id number] DOB: May 19, 1946 Today's Date: 07/24/2021   History of Present Illness Suzanne Avila is a 75 y.o. female with medical history significant for nicotine dependence, COPD, paroxysmal atrial fibrillation, hypertension, dyslipidemia who presents to the ER via EMS for evaluation of pain in her right thigh following a fall and is found to have a distal displaced and comminuted femoral diaphyseal fracture. Underwent R IM nailing on 9/28 with Dr. Rosita Kea.    PT Comments    Pt was long sitting in bed upon arriving. " I need to go pee." Author assisted pt to/from [facility] to urinate prior to pt returning to bed and performing HEP handout. See exercises performed below. Pt does tolerate session well but is severely limited by fatigue. She does continue to require vcs for maintaining proper PWB but overall is improving. Acute PT still recommends DC to SNF to improve activity tolerance while maximizing independence with ADLs while adhering to proper PWB. She was long sitting in bed post session with call bell in reach and RN aware of pt's abilities.     Recommendations for follow up therapy are one component of a multi-disciplinary discharge planning process, led by the attending physician.  Recommendations may be updated based on patient status, additional functional criteria and insurance authorization.  Follow Up Recommendations  SNF     Equipment Recommendations  None recommended by PT       Precautions / Restrictions Precautions Precautions: Fall Restrictions Weight Bearing Restrictions: Yes RLE Weight Bearing: Partial weight bearing RLE Partial Weight Bearing Percentage or Pounds: 25% Other Position/Activity Restrictions: PWB 25% only on R LE     Mobility  Bed Mobility Overal bed mobility: Needs Assistance Bed Mobility: Supine to Sit;Sit to Supine     Supine to sit: Min assist Sit to supine: Min assist    General bed mobility comments: Min assist + vcs for exiting and re-entry of bed. Vcs throughout for technique improvements. HOB was elevated and pt did use bed rails    Transfers Overall transfer level: Needs assistance Equipment used: Rolling walker (2 wheeled) Transfers: Sit to/from Stand Sit to Stand: Min guard         General transfer comment: Pt required less assistance in PM than AM. CGA for safety with vcs for adhering to wb bearing restrictions  Ambulation/Gait Ambulation/Gait assistance: Min guard Gait Distance (Feet): 3 Feet Assistive device: Rolling walker (2 wheeled) Gait Pattern/deviations: Step-through pattern;Decreased weight shift to right Gait velocity: decrease   General Gait Details: pt was able to ambulate to [facility] and return. constant vcs for safety improvements however no LOB or safety concern. Ambulated greater distances in AM session     Balance Overall balance assessment: History of Falls;Needs assistance Sitting-balance support: No upper extremity supported;Feet supported Sitting balance-Leahy Scale: Good     Standing balance support: Bilateral upper extremity supported Standing balance-Leahy Scale: Fair Standing balance comment: Pt does require BUE support for safety while in all standing activity due to PWB and inablility to stand on one LE without UE support        Cognition Arousal/Alertness: Awake/alert Behavior During Therapy: WFL for tasks assessed/performed Overall Cognitive Status: Within Functional Limits for tasks assessed        General Comments: Pt is A and O x 4. cooperative but does require som encouragement.      Exercises Total Joint Exercises Ankle Circles/Pumps: AROM;10 reps Quad Sets: AROM;10 reps Gluteal Sets: AROM;10 reps Heel  Slides: AROM;AAROM;10 reps Hip ABduction/ADduction: AROM;10 reps Straight Leg Raises: AROM;5 reps Other Exercises Other Exercises: PWBing precautions and , AE/DME for safe dressing donning and  doff socks using Sock aid and reacher Other Exercises: Done sit<> stand 4 x S - after education done adn reminder with first one hand placement - weight bearing Other Exercises: Supine<> sit independent with S        Pertinent Vitals/Pain Pain Assessment: 0-10 Pain Score: 4  Faces Pain Scale: Hurts little more Pain Location: R knee pain Pain Descriptors / Indicators: Guarding;Grimacing Pain Intervention(s): Limited activity within patient's tolerance;Monitored during session;Premedicated before session;Repositioned    Home Living Family/patient expects to be discharged to:: Private residence Living Arrangements: Spouse/significant other Available Help at Discharge: Family;Available 24 hours/day Type of Home: House Home Access: Level entry;Ramped entrance   Home Layout: One level Home Equipment: Bedside commode;Shower seat;Walker - 4 wheels;Walker - 2 wheels;Grab bars - tub/shower;Hand held shower head;Adaptive equipment;Cane - single point          PT Goals (current goals can now be found in the care plan section) Acute Rehab PT Goals Patient Stated Goal: feel better and go back home Progress towards PT goals: Progressing toward goals    Frequency    BID      PT Plan Current plan remains appropriate       AM-PAC PT "6 Clicks" Mobility   Outcome Measure  Help needed turning from your back to your side while in a flat bed without using bedrails?: A Little Help needed moving from lying on your back to sitting on the side of a flat bed without using bedrails?: A Little Help needed moving to and from a bed to a chair (including a wheelchair)?: A Little Help needed standing up from a chair using your arms (e.g., wheelchair or bedside chair)?: A Lot Help needed to walk in hospital room?: A Lot Help needed climbing 3-5 steps with a railing? : A Lot 6 Click Score: 15    End of Session Equipment Utilized During Treatment: Gait belt Activity Tolerance: Patient limited by  fatigue;Patient tolerated treatment well Patient left: in bed;with call bell/phone within reach;with bed alarm set Nurse Communication: Mobility status PT Visit Diagnosis: Unsteadiness on feet (R26.81);Other abnormalities of gait and mobility (R26.89);Muscle weakness (generalized) (M62.81)     Time: 5361-4431 PT Time Calculation (min) (ACUTE ONLY): 25 min  Charges:  $Gait Training: 8-22 mins $Therapeutic Exercise: 8-22 mins $Therapeutic Activity: 8-22 mins                     Jetta Lout PTA 07/24/21, 5:19 PM

## 2021-07-24 NOTE — Progress Notes (Signed)
   Subjective: 3 Days Post-Op Procedure(s) (LRB): INTRAMEDULLARY (IM) RETROGRADE FEMORAL NAILING (Right) Patient reports pain as mild.   Patient is well, and has had no acute complaints or problems We will continue with physical therapy today.   Objective: Vital signs in last 24 hours: Temp:  [97.8 F (36.6 C)-98.6 F (37 C)] 97.8 F (36.6 C) (10/01 0729) Pulse Rate:  [65-81] 72 (10/01 0729) Resp:  [14-16] 14 (10/01 0729) BP: (127-171)/(59-94) 171/75 (10/01 0729) SpO2:  [94 %-97 %] 97 % (10/01 0802)  Intake/Output from previous day: 09/30 0701 - 10/01 0700 In: 240 [P.O.:240] Out: -  Intake/Output this shift: No intake/output data recorded.  Recent Labs    07/22/21 0601 07/23/21 0625  HGB 7.2* 8.6*   Recent Labs    07/22/21 0601 07/23/21 0625  WBC 6.2 11.0*  RBC 2.64* 3.09*  HCT 21.7* 24.5*  PLT 257 289   Recent Labs    07/22/21 0601 07/23/21 0625  NA 131* 133*  K 4.9 4.8  CL 101 103  CO2 24 24  BUN 24* 22  CREATININE 0.95 0.81  GLUCOSE 210* 106*  CALCIUM 8.3* 8.2*   No results for input(s): LABPT, INR in the last 72 hours.   EXAM General - Patient is Alert, Appropriate, and Oriented Extremity - Neurovascular intact Sensation intact distally Intact pulses distally Dorsiflexion/Plantar flexion intact Minimal swelling throughout the thigh Dressing - dressing C/D/I and no drainage, Ace wrap intact. Motor Function - intact, moving foot and toes well on exam.   Past Medical History:  Diagnosis Date   Closed left arm fracture    COPD (chronic obstructive pulmonary disease) (HCC)    Dehydration 09/15/2020   Glaucoma    Hyperlipidemia    Hypertension    Lung mass     Assessment/Plan:   3 Days Post-Op Procedure(s) (LRB): INTRAMEDULLARY (IM) RETROGRADE FEMORAL NAILING (Right) Principal Problem:   Femur fracture, right (HCC) Active Problems:   COPD (chronic obstructive pulmonary disease) (HCC)   Essential hypertension   Tobacco use disorder    AF (paroxysmal atrial fibrillation) (HCC)   Fall at home, initial encounter   AKI (acute kidney injury) (HCC)   Depression  Estimated body mass index is 20.76 kg/m as calculated from the following:   Height as of this encounter: 4\' 8"  (1.422 m).   Weight as of this encounter: 42 kg. Advance diet Up with therapy, 25% weightbearing right lower extremity Acute postop blood loss anemia -hemoglobin 8.6, s/p 1 unit PRBC 9/29.  Vital signs are stable Pain controlled CM to assist with discharge to SNF  Patient will need follow-up with [facility] orthopedics in 2 weeks for x-rays and staple removal.   DVT Prophylaxis - Lovenox, TED hose, and SCDs Partial weightbearing right lower extremity 25%.  [facility], PA-C [facility] 07/24/2021, 8:14 AM

## 2021-07-24 NOTE — Progress Notes (Addendum)
Physical Therapy Treatment Patient Details Name: Suzanne Avila MRN: [id number] DOB: 1946/07/15 Today's Date: 07/24/2021   History of Present Illness Suzanne Avila is a 75 y.o. female with medical history significant for nicotine dependence, COPD, paroxysmal atrial fibrillation, hypertension, dyslipidemia who presents to the ER via EMS for evaluation of pain in her right thigh following a fall and is found to have a distal displaced and comminuted femoral diaphyseal fracture. Underwent R IM nailing on 9/28 with Dr. Rosita Kea.    PT Comments    Pt was long sitting in bed upon arriving. She is alert and oriented and able to correctly state proper wt bearing. She does require some encouragement to get OOB but once agreeable, fully participates. Pt does require vcs in all standing to remain PWB but overall she does well. She is severely deconditioned. Several standing rest breaks with ambulation only ~ 20 ft. 3 standing rest during those 20 ft. Pt returned to bed and requested resting prior to performing there ex handout.Will perform there ex handout in PM/2nd session. Author recommends DC to SNF to address deficits while improving activity tolerance with ADLs. Acute PT will continue to follow and progress as able per current POC.   Recommendations for follow up therapy are one component of a multi-disciplinary discharge planning process, led by the attending physician.  Recommendations may be updated based on patient status, additional functional criteria and insurance authorization.  Follow Up Recommendations  SNF     Equipment Recommendations  None recommended by PT       Precautions / Restrictions Precautions Precautions: Fall Restrictions Weight Bearing Restrictions: Yes RLE Weight Bearing: Partial weight bearing RLE Partial Weight Bearing Percentage or Pounds: 25% Other Position/Activity Restrictions: PWB 25% only on R LE     Mobility  Bed Mobility Overal bed mobility: Needs  Assistance Bed Mobility: Supine to Sit;Sit to Supine     Supine to sit: Min assist Sit to supine: Min assist   General bed mobility comments: Min assist + vcs for exiting and re-entry of bed. Vcs throughout for technique improvements. HOB was elevated and pt did use bed rails    Transfers Overall transfer level: Needs assistance Equipment used: Rolling walker (2 wheeled) Transfers: Sit to/from Stand Sit to Stand: Min assist         General transfer comment: pt was able to stand from EOB (lowest)_ with min assist + vcs. did demonstrate good eccentric controlled lowering with tand to sit. Was able to adhere to proper wt bearing throughout with constant reminders  Ambulation/Gait Ambulation/Gait assistance: Min guard Gait Distance (Feet): 20 Feet Assistive device: Rolling walker (2 wheeled) Gait Pattern/deviations: Step-through pattern;Decreased weight shift to right Gait velocity: decrease   General Gait Details: Pt was able to ambulate 20 ft with RW while adhering to PWB (25 %). does require Vcs for constant reminders. Does fatigue extremely quickly and needs several standing rest.    Balance Overall balance assessment: History of Falls;Needs assistance Sitting-balance support: No upper extremity supported;Feet supported Sitting balance-Leahy Scale: Good     Standing balance support: Bilateral upper extremity supported Standing balance-Leahy Scale: Fair Standing balance comment: Pt does require BUE support for safety while in all standing activity due to PWB and inablility to stand on one LE without UE support      Cognition Arousal/Alertness: Awake/alert Behavior During Therapy: WFL for tasks assessed/performed Overall Cognitive Status: Within Functional Limits for tasks assessed      General Comments: Pt is A and O  x 4. cooperative but does require som encouragement.      Exercises Other Exercises Other Exercises: PWBing precautions and , AE/DME for safe dressing  donning and doff socks using Sock aid and reacher Other Exercises: Done sit<> stand 4 x S - after education done adn reminder with first one hand placement - weight bearing Other Exercises: Supine<> sit independent with S        Pertinent Vitals/Pain Pain Assessment: 0-10 Pain Score: 4  Faces Pain Scale: Hurts little more Pain Location: R knee pain Pain Descriptors / Indicators: Guarding;Grimacing Pain Intervention(s): Limited activity within patient's tolerance;Monitored during session;Premedicated before session;Repositioned    Home Living Family/patient expects to be discharged to:: Private residence Living Arrangements: Spouse/significant other Available Help at Discharge: Family;Available 24 hours/day Type of Home: House Home Access: Level entry;Ramped entrance   Home Layout: One level Home Equipment: Bedside commode;Shower seat;Walker - 4 wheels;Walker - 2 wheels;Grab bars - tub/shower;Hand held shower head;Adaptive equipment;Cane - single point      Prior Function            PT Goals (current goals can now be found in the care plan section) Acute Rehab PT Goals Patient Stated Goal: feel better and go back home Progress towards PT goals: Progressing toward goals    Frequency    BID      PT Plan Current plan remains appropriate    Co-evaluation              AM-PAC PT "6 Clicks" Mobility   Outcome Measure  Help needed turning from your back to your side while in a flat bed without using bedrails?: A Little Help needed moving from lying on your back to sitting on the side of a flat bed without using bedrails?: A Little Help needed moving to and from a bed to a chair (including a wheelchair)?: A Little Help needed standing up from a chair using your arms (e.g., wheelchair or bedside chair)?: A Lot Help needed to walk in hospital room?: A Lot Help needed climbing 3-5 steps with a railing? : A Lot 6 Click Score: 15    End of Session Equipment Utilized  During Treatment: Gait belt Activity Tolerance: Patient limited by fatigue;Patient tolerated treatment well Patient left: in bed;with call bell/phone within reach;with bed alarm set Nurse Communication: Mobility status PT Visit Diagnosis: Unsteadiness on feet (R26.81);Other abnormalities of gait and mobility (R26.89);Muscle weakness (generalized) (M62.81)     Time: 1244-1300 PT Time Calculation (min) (ACUTE ONLY): 16 min  Charges:  $Gait Training: 8-22 mins                     Jetta Lout PTA 07/24/21, 5:11 PM

## 2021-07-24 NOTE — Progress Notes (Signed)
Occupational Therapy Treatment Patient Details Name: Suzanne Avila MRN: [id number] DOB: 29-Dec-1945 Today's Date: 07/24/2021   History of present illness Suzanne Avila is a 75 y.o. female with medical history significant for nicotine dependence, COPD, paroxysmal atrial fibrillation, hypertension, dyslipidemia who presents to the ER via EMS for evaluation of pain in her right thigh following a fall and is found to have a distal displaced and comminuted femoral diaphyseal fracture. Underwent R IM nailing on 9/28 with Dr. Rosita Kea.   OT comments  Pt seen for OT tx. Pt  in bed resting and willing to work with OT. Per pt plan is for her to go to [facility]. Pt alert and oriented x4.  Pt instructed in AE for LB dressing with verbal instruction and with demonstration.  As well as sit<> stand doing very well - v/c first trial but after that S - no LOB - pt did get little teary eye about fear of falling - that she do not want to fall again. Reassure pt that PT and OT will address that during her rehab the next few wks and weight bearing restriction temporary -will be upgraded as bone heals. Supine <> sit Supervision. Pain 6-7/10 but did not limit pt.  Pt continues to benefit from skilled OT services. Continue to recommend SNF at this time.    Recommendations for follow up therapy are one component of a multi-disciplinary discharge planning process, led by the attending physician.  Recommendations may be updated based on patient status, additional functional criteria and insurance authorization.    Follow Up Recommendations       Equipment Recommendations       Recommendations for Other Services      Precautions / Restrictions Precautions Precautions: Fall Restrictions Other Position/Activity Restrictions: PWB 25% only on R LE       Mobility Bed Mobility                    Transfers                      Balance                                           ADL  either performed or assessed with clinical judgement   ADL                                               Vision       Perception     Praxis      Cognition Arousal/Alertness: Awake/alert Behavior During Therapy: WFL for tasks assessed/performed Overall Cognitive Status: Within Functional Limits for tasks assessed                                          Exercises Other Exercises Other Exercises: PWBing precautions and , AE/DME for safe dressing donning and doff socks using Sock aid and reacher Other Exercises: Done sit<> stand 4 x S - after education done adn reminder with first one hand placement - weight bearing Other Exercises: Supine<> sit independent with S   Shoulder Instructions       General  Comments      Pertinent Vitals/ Pain       Pain Score: 6  Pain Location: R knee pain Pain Descriptors / Indicators: Guarding;Grimacing Pain Intervention(s): Limited activity within patient's tolerance;Monitored during session  Home Living Family/patient expects to be discharged to:: Private residence Living Arrangements: Spouse/significant other Available Help at Discharge: Family;Available 24 hours/day Type of Home: House Home Access: Level entry;Ramped entrance     Home Layout: One level     Bathroom Shower/Tub: Walk-in shower;Tub/shower unit         Home Equipment: Bedside commode;Shower seat;Walker - 4 wheels;Walker - 2 wheels;Grab bars - tub/shower;Hand held shower head;Adaptive equipment;Cane - single point          Prior Functioning/Environment              Frequency  Min 2X/week        Progress Toward Goals  OT Goals(current goals can now be found in the care plan section)     Acute Rehab OT Goals Patient Stated Goal: feel better and go back home OT Goal Formulation: With patient Time For Goal Achievement: 08/05/21 Potential to Achieve Goals: Good  Plan      Co-evaluation                  AM-PAC OT "6 Clicks" Daily Activity     Outcome Measure                    End of Session Equipment Utilized During Treatment: Gait belt  OT Visit Diagnosis: Other abnormalities of gait and mobility (R26.89);History of falling (Z91.81);Muscle weakness (generalized) (M62.81);Pain Pain - Right/Left: Right Pain - part of body: Hip;Knee;Leg   Activity Tolerance Patient tolerated treatment well;Other (comment)   Patient Left in chair;with call bell/phone within reach;with chair alarm set   Nurse Communication          Time: 1230-1300 OT Time Calculation (min): 30 min  Charges: OT General Charges $OT Visit: 1 Visit OT Treatments $Self Care/Home Management : 23-37 mins    Charnelle Bergeman OTR/L, CLT 07/24/2021, 3:17 PM

## 2021-07-24 NOTE — Progress Notes (Signed)
Progress Note    Suzanne Avila  ZOX:[id number] DOB: [birth year]  DOA: 07/21/2021 PCP: Erasmo Downer, MD      Brief Narrative:    Medical records reviewed and are as summarized below:  Suzanne Avila is a 75 y.o. female with past medical history significant for paroxysmal atrial fibrillation, chronic diastolic CHF, COPD, tobacco use disorder, hypertension, dyslipidemia, right lung abscess, left arm fracture, who presented to the hospital because of pain in the right thigh following a fall.  She was found to have comminuted right femur fracture.  She was treated with analgesics.  She underwent intramedullary retrograde femoral nailing of right femur fracture on 07/21/2021.    Assessment/Plan:   Principal Problem:   Femur fracture, right (HCC) Active Problems:   COPD (chronic obstructive pulmonary disease) (HCC)   Essential hypertension   Tobacco use disorder   AF (paroxysmal atrial fibrillation) (HCC)   Fall at home, initial encounter   AKI (acute kidney injury) (HCC)   Depression    Body mass index is 20.76 kg/m.  Comminuted closed right femur fracture s/p mechanical fall: S/p intramedullary retrograde femoral nailing on 07/21/2021.  Analgesics as needed for pain.  Follow-up with orthopedic surgeon as an outpatient.  Acute postoperative blood loss anemia, dizziness: Dizziness has improved.  H&H improved s/p transfusion with 1 unit of PRBCs on 07/22/2021.    Chronic diastolic CHF, hypertension: BP is uncontrolled.  Resume ramipril, torsemide and Aldactone  AKI with hyperkalemia: Resolved  Hyponatremia: Sodium level is stable and she is asymptomatic.  Paroxysmal atrial fibrillation: She is on Cardizem but she is not on long-term anticoagulation.  Outpatient follow-up with cardiologist.  COPD: Stable.  Continue bronchodilators.  Awaiting placement to SNF  Diet Order             Diet regular Room service appropriate? Yes; Fluid consistency: Thin  Diet  effective now                      Consultants: Orthopedic surgeon  Procedures: S/p intramedullary retrograde femoral nailing on 07/21/2021    Medications:    arformoterol  15 mcg Nebulization BID   And   umeclidinium bromide  1 puff Inhalation Daily   atorvastatin  40 mg Oral Daily   diltiazem  120 mg Oral Daily   docusate sodium  100 mg Oral BID   dorzolamide  1 drop Both Eyes BID   enoxaparin (LOVENOX) injection  30 mg Subcutaneous Daily   feeding supplement  237 mL Oral BID BM   ferrous fumarate-b12-vitamic C-folic acid  1 capsule Oral BID   latanoprost  1 drop Both Eyes QHS   magnesium gluconate  500 mg Oral Daily   magnesium hydroxide  30 mL Oral Daily   mirtazapine  7.5 mg Oral QHS   multivitamin with minerals  1 tablet Oral Daily   pantoprazole  40 mg Oral Daily   pregabalin  75 mg Oral BID   ramipril  2.5 mg Oral Daily   rOPINIRole  2 mg Oral BID   senna  1 tablet Oral BID   spironolactone  25 mg Oral Daily   torsemide  40 mg Oral BID   traMADol  50 mg Oral Q6H   vitamin B-12  500 mcg Oral Daily   Continuous Infusions:  methocarbamol (ROBAXIN) IV       Anti-infectives (From admission, onward)    Start     Dose/Rate Route Frequency Ordered Stop  07/21/21 2100  ceFAZolin (ANCEF) IVPB 1 g/50 mL premix        1 g 100 mL/hr over 30 Minutes Intravenous Every 6 hours 07/21/21 1818 07/22/21 0940   07/21/21 1450  ceFAZolin (ANCEF) 1-4 GM/50ML-% IVPB       Note to Pharmacy: Kerman Passey, Cryst: cabinet override      07/21/21 1450 07/21/21 1527   07/21/21 1430  ceFAZolin (ANCEF) IVPB 1 g/50 mL premix        1 g 100 mL/hr over 30 Minutes Intravenous To Surgery 07/21/21 0821 07/21/21 1548   07/21/21 1000  fluconazole (DIFLUCAN) tablet 100 mg  Status:  Discontinued        100 mg Oral Daily 07/21/21 0902 07/23/21 1229              Family Communication/Anticipated D/C date and plan/Code Status   DVT prophylaxis: enoxaparin (LOVENOX) injection  30 mg Start: 07/22/21 1200 SCDs Start: 07/21/21 1819 Place TED hose Start: 07/21/21 1819 SCDs Start: 07/21/21 0902     Code Status: Full Code  Family Communication: None Disposition Plan:    Status is: Inpatient  Remains inpatient appropriate because:Inpatient level of care appropriate due to severity of illness  Dispo: The patient is from: Home              Anticipated d/c is to: SNF              Patient currently is medically stable to d/c.   Difficult to place patient No           Subjective:   Interval events noted.  She has pain in the right lower extremity with movement or activity.  No other complaints.   Objective:    Vitals:   07/23/21 2313 07/24/21 0401 07/24/21 0729 07/24/21 0802  BP: (!) 159/62 (!) 156/72 (!) 171/75   Pulse: 71 72 72   Resp: 16 16 14    Temp: 98.1 F (36.7 C) 98.2 F (36.8 C) 97.8 F (36.6 C)   TempSrc:      SpO2: 96% 96% 97% 97%  Weight:      Height:       No data found.   Intake/Output Summary (Last 24 hours) at 07/24/2021 1007 Last data filed at 07/24/2021 0900 Gross per 24 hour  Intake 360 ml  Output --  Net 360 ml   Filed Weights   07/21/21 0650 07/21/21 1422  Weight: 42 kg 42 kg    Exam:  GEN: NAD SKIN: No rash EYES: EOMI ENT: MMM CV: RRR PULM: CTA B ABD: soft, ND, NT, +BS CNS: AAO x 3, non focal EXT: Mild right hip swelling and tenderness.  Dressing on the right lower extremity is clean, dry and intact.          Data Reviewed:   I have personally reviewed following labs and imaging studies:  Labs: Labs show the following:   Basic Metabolic Panel: Recent Labs  Lab 07/21/21 0553 07/22/21 0601 07/23/21 0625  NA 130* 131* 133*  K 5.2* 4.9 4.8  CL 94* 101 103  CO2 22 24 24   GLUCOSE 103* 210* 106*  BUN 38* 24* 22  CREATININE 1.47* 0.95 0.81  CALCIUM 8.7* 8.3* 8.2*   GFR Estimated Creatinine Clearance: 33.9 mL/min (by C-G formula based on SCr of 0.81 mg/dL). Liver Function Tests: No  results for input(s): AST, ALT, ALKPHOS, BILITOT, PROT, ALBUMIN in the last 168 hours. No results for input(s): LIPASE, AMYLASE in the last 168  hours. No results for input(s): AMMONIA in the last 168 hours. Coagulation profile Recent Labs  Lab 07/21/21 0553  INR 1.0    CBC: Recent Labs  Lab 07/21/21 0553 07/22/21 0601 07/23/21 0625  WBC 10.8* 6.2 11.0*  NEUTROABS 8.5*  --  8.8*  HGB 10.3* 7.2* 8.6*  HCT 30.5* 21.7* 24.5*  MCV 81.1 82.2 79.3*  PLT 289 257 289   Cardiac Enzymes: No results for input(s): CKTOTAL, CKMB, CKMBINDEX, TROPONINI in the last 168 hours. BNP (last 3 results) No results for input(s): PROBNP in the last 8760 hours. CBG: No results for input(s): GLUCAP in the last 168 hours. D-Dimer: No results for input(s): DDIMER in the last 72 hours. Hgb A1c: No results for input(s): HGBA1C in the last 72 hours. Lipid Profile: No results for input(s): CHOL, HDL, LDLCALC, TRIG, CHOLHDL, LDLDIRECT in the last 72 hours. Thyroid function studies: No results for input(s): TSH, T4TOTAL, T3FREE, THYROIDAB in the last 72 hours.  Invalid input(s): FREET3 Anemia work up: No results for input(s): VITAMINB12, FOLATE, FERRITIN, TIBC, IRON, RETICCTPCT in the last 72 hours. Sepsis Labs: Recent Labs  Lab 07/21/21 0553 07/22/21 0601 07/23/21 0625  WBC 10.8* 6.2 11.0*    Microbiology Recent Results (from the past 240 hour(s))  Resp Panel by RT-PCR (Flu A&B, Covid) Nasopharyngeal Swab     Status: None   Collection Time: 07/21/21  5:53 AM   Specimen: Nasopharyngeal Swab; Nasopharyngeal(NP) swabs in vial transport medium  Result Value Ref Range Status   SARS Coronavirus 2 by RT PCR NEGATIVE NEGATIVE Final    Comment: (NOTE) SARS-CoV-2 target nucleic acids are NOT DETECTED.  The SARS-CoV-2 RNA is generally detectable in upper respiratory specimens during the acute phase of infection. The lowest concentration of SARS-CoV-2 viral copies this assay can detect is 138  copies/mL. A negative result does not preclude SARS-Cov-2 infection and should not be used as the sole basis for treatment or other patient management decisions. A negative result may occur with  improper specimen collection/handling, submission of specimen other than nasopharyngeal swab, presence of viral mutation(s) within the areas targeted by this assay, and inadequate number of viral copies(<138 copies/mL). A negative result must be combined with clinical observations, patient history, and epidemiological information. The expected result is Negative.  Fact Sheet for Patients:  BloggerCourse.com  Fact Sheet for Healthcare Providers:  SeriousBroker.it  This test is no t yet approved or cleared by the [place] FDA and  has been authorized for detection and/or diagnosis of SARS-CoV-2 by FDA under an Emergency Use Authorization (EUA). This EUA will remain  in effect (meaning this test can be used) for the duration of the COVID-19 declaration under Section 564(b)(1) of the Act, 21 U.S.C.section 360bbb-3(b)(1), unless the authorization is terminated  or revoked sooner.       Influenza A by PCR NEGATIVE NEGATIVE Final   Influenza B by PCR NEGATIVE NEGATIVE Final    Comment: (NOTE) The Xpert Xpress SARS-CoV-2/FLU/RSV plus assay is intended as an aid in the diagnosis of influenza from Nasopharyngeal swab specimens and should not be used as a sole basis for treatment. Nasal washings and aspirates are unacceptable for Xpert Xpress SARS-CoV-2/FLU/RSV testing.  Fact Sheet for Patients: BloggerCourse.com  Fact Sheet for Healthcare Providers: SeriousBroker.it  This test is not yet approved or cleared by the [place] FDA and has been authorized for detection and/or diagnosis of SARS-CoV-2 by FDA under an Emergency Use Authorization (EUA). This EUA will remain in effect (meaning  this  test can be used) for the duration of the COVID-19 declaration under Section 564(b)(1) of the Act, 21 U.S.C. section 360bbb-3(b)(1), unless the authorization is terminated or revoked.  Performed at [facility], [address] [place], [place] [address]     Procedures and diagnostic studies:  No results found.             LOS: 3 days   Pierce Biagini  [facility]   Pager on [email or web address]. If 7PM-7AM, please contact night-coverage at [email or web address]     07/24/2021, 10:07 AM

## 2021-07-25 LAB — CBC
HCT: 34.4 % — ABNORMAL LOW (ref 36.0–46.0)
Hemoglobin: 12 g/dL (ref 12.0–15.0)
MCH: 28.7 pg (ref 26.0–34.0)
MCHC: 34.9 g/dL (ref 30.0–36.0)
MCV: 82.3 fL (ref 80.0–100.0)
Platelets: 486 10*3/uL — ABNORMAL HIGH (ref 150–400)
RBC: 4.18 MIL/uL (ref 3.87–5.11)
RDW: 19.1 % — ABNORMAL HIGH (ref 11.5–15.5)
WBC: 10.5 10*3/uL (ref 4.0–10.5)
nRBC: 0 % (ref 0.0–0.2)

## 2021-07-25 NOTE — Progress Notes (Signed)
Progress Note    Suzanne Avila  IOX:[id number] DOB: 10/09/1946  DOA: 07/21/2021 PCP: Erasmo Downer, MD      Brief Narrative:    Medical records reviewed and are as summarized below:  Suzanne Avila is a 75 y.o. female with past medical history significant for paroxysmal atrial fibrillation, chronic diastolic CHF, COPD, tobacco use disorder, hypertension, dyslipidemia, right lung abscess, left arm fracture, who presented to the hospital because of pain in the right thigh following a fall.  She was found to have comminuted right femur fracture.  She was treated with analgesics.  She underwent intramedullary retrograde femoral nailing of right femur fracture on 07/21/2021.    Assessment/Plan:   Principal Problem:   Femur fracture, right (HCC) Active Problems:   COPD (chronic obstructive pulmonary disease) (HCC)   Essential hypertension   Tobacco use disorder   AF (paroxysmal atrial fibrillation) (HCC)   Fall at home, initial encounter   AKI (acute kidney injury) (HCC)   Depression    Body mass index is 20.76 kg/m.  Comminuted closed right femur fracture s/p mechanical fall: S/p intramedullary retrograde femoral nailing on 07/21/2021.  Continue analgesics as needed for pain.  Follow-up with orthopedic surgeon as an outpatient.  Acute postoperative blood loss anemia, dizziness: Dizziness has improved.  H&H improved s/p transfusion with 1 unit of PRBCs on 07/22/2021.    Chronic diastolic CHF, hypertension: Continue antihypertensives  AKI with hyperkalemia: Resolved  Hyponatremia: Sodium level is stable and she is asymptomatic.  Paroxysmal atrial fibrillation: She is on Cardizem but she is not on long-term anticoagulation.  Outpatient follow-up with cardiologist.  COPD: Stable.  Continue bronchodilators.  Awaiting placement to SNF  Diet Order             Diet regular Room service appropriate? Yes; Fluid consistency: Thin  Diet effective now                       Consultants: Orthopedic surgeon  Procedures: S/p intramedullary retrograde femoral nailing on 07/21/2021    Medications:    arformoterol  15 mcg Nebulization BID   And   umeclidinium bromide  1 puff Inhalation Daily   atorvastatin  40 mg Oral Daily   diltiazem  120 mg Oral Daily   docusate sodium  100 mg Oral BID   dorzolamide  1 drop Both Eyes BID   enoxaparin (LOVENOX) injection  30 mg Subcutaneous Daily   feeding supplement  237 mL Oral BID BM   ferrous fumarate-b12-vitamic C-folic acid  1 capsule Oral BID   latanoprost  1 drop Both Eyes QHS   magnesium gluconate  500 mg Oral Daily   magnesium hydroxide  30 mL Oral Daily   mirtazapine  7.5 mg Oral QHS   multivitamin with minerals  1 tablet Oral Daily   pantoprazole  40 mg Oral Daily   pregabalin  75 mg Oral BID   ramipril  2.5 mg Oral Daily   rOPINIRole  2 mg Oral BID   senna  1 tablet Oral BID   spironolactone  25 mg Oral Daily   torsemide  40 mg Oral BID   traMADol  50 mg Oral Q6H   vitamin B-12  500 mcg Oral Daily   Continuous Infusions:  methocarbamol (ROBAXIN) IV       Anti-infectives (From admission, onward)    Start     Dose/Rate Route Frequency Ordered Stop   07/21/21 2100  ceFAZolin (ANCEF)  IVPB 1 g/50 mL premix        1 g 100 mL/hr over 30 Minutes Intravenous Every 6 hours 07/21/21 1818 07/22/21 0940   07/21/21 1450  ceFAZolin (ANCEF) 1-4 GM/50ML-% IVPB       Note to Pharmacy: Kerman Passey, Cryst: cabinet override      07/21/21 1450 07/21/21 1527   07/21/21 1430  ceFAZolin (ANCEF) IVPB 1 g/50 mL premix        1 g 100 mL/hr over 30 Minutes Intravenous To Surgery 07/21/21 0821 07/21/21 1548   07/21/21 1000  fluconazole (DIFLUCAN) tablet 100 mg  Status:  Discontinued        100 mg Oral Daily 07/21/21 0902 07/23/21 1229              Family Communication/Anticipated D/C date and plan/Code Status   DVT prophylaxis: enoxaparin (LOVENOX) injection 30 mg Start: 07/22/21  1200 SCDs Start: 07/21/21 1819 Place TED hose Start: 07/21/21 1819 SCDs Start: 07/21/21 0902     Code Status: Full Code  Family Communication: None Disposition Plan:    Status is: Inpatient  Remains inpatient appropriate because:Inpatient level of care appropriate due to severity of illness  Dispo: The patient is from: Home              Anticipated d/c is to: SNF              Patient currently is medically stable to d/c.   Difficult to place patient No           Subjective:   Interval events noted.  She has pain in the right able to move She has moved her bowels.   Objective:    Vitals:   07/25/21 0819 07/25/21 0835 07/25/21 0838 07/25/21 0935  BP:  (!) 155/90 (!) 145/131 (!) 146/87  Pulse:  91 78   Resp:  16 16   Temp:  98 F (36.7 C) 98.1 F (36.7 C)   TempSrc:      SpO2: 94% 96% 96%   Weight:      Height:       No data found.   Intake/Output Summary (Last 24 hours) at 07/25/2021 1143 Last data filed at 07/25/2021 1008 Gross per 24 hour  Intake 600 ml  Output 500 ml  Net 100 ml   Filed Weights   07/21/21 0650 07/21/21 1422  Weight: 42 kg 42 kg    Exam:  GEN: NAD SKIN: No rash EYES: EOMI ENT: MMM CV: RRR PULM: CTA B ABD: soft, ND, NT, +BS CNS: AAO x 3, non focal EXT: Mild right hip tenderness.  Dressing on the right lower extremity is clean, dry and intact.          Data Reviewed:   I have personally reviewed following labs and imaging studies:  Labs: Labs show the following:   Basic Metabolic Panel: Recent Labs  Lab 07/21/21 0553 07/22/21 0601 07/23/21 0625  NA 130* 131* 133*  K 5.2* 4.9 4.8  CL 94* 101 103  CO2 22 24 24   GLUCOSE 103* 210* 106*  BUN 38* 24* 22  CREATININE 1.47* 0.95 0.81  CALCIUM 8.7* 8.3* 8.2*   GFR Estimated Creatinine Clearance: 33.9 mL/min (by C-G formula based on SCr of 0.81 mg/dL). Liver Function Tests: No results for input(s): AST, ALT, ALKPHOS, BILITOT, PROT, ALBUMIN in the last 168  hours. No results for input(s): LIPASE, AMYLASE in the last 168 hours. No results for input(s): AMMONIA in the last 168 hours.  Coagulation profile Recent Labs  Lab 07/21/21 0553  INR 1.0    CBC: Recent Labs  Lab 07/21/21 0553 07/22/21 0601 07/23/21 0625 07/25/21 0822  WBC 10.8* 6.2 11.0* 10.5  NEUTROABS 8.5*  --  8.8*  --   HGB 10.3* 7.2* 8.6* 12.0  HCT 30.5* 21.7* 24.5* 34.4*  MCV 81.1 82.2 79.3* 82.3  PLT 289 257 289 486*   Cardiac Enzymes: No results for input(s): CKTOTAL, CKMB, CKMBINDEX, TROPONINI in the last 168 hours. BNP (last 3 results) No results for input(s): PROBNP in the last 8760 hours. CBG: No results for input(s): GLUCAP in the last 168 hours. D-Dimer: No results for input(s): DDIMER in the last 72 hours. Hgb A1c: No results for input(s): HGBA1C in the last 72 hours. Lipid Profile: No results for input(s): CHOL, HDL, LDLCALC, TRIG, CHOLHDL, LDLDIRECT in the last 72 hours. Thyroid function studies: No results for input(s): TSH, T4TOTAL, T3FREE, THYROIDAB in the last 72 hours.  Invalid input(s): FREET3 Anemia work up: No results for input(s): VITAMINB12, FOLATE, FERRITIN, TIBC, IRON, RETICCTPCT in the last 72 hours. Sepsis Labs: Recent Labs  Lab 07/21/21 0553 07/22/21 0601 07/23/21 0625 07/25/21 0822  WBC 10.8* 6.2 11.0* 10.5    Microbiology Recent Results (from the past 240 hour(s))  Resp Panel by RT-PCR (Flu A&B, Covid) Nasopharyngeal Swab     Status: None   Collection Time: 07/21/21  5:53 AM   Specimen: Nasopharyngeal Swab; Nasopharyngeal(NP) swabs in vial transport medium  Result Value Ref Range Status   SARS Coronavirus 2 by RT PCR NEGATIVE NEGATIVE Final    Comment: (NOTE) SARS-CoV-2 target nucleic acids are NOT DETECTED.  The SARS-CoV-2 RNA is generally detectable in upper respiratory specimens during the acute phase of infection. The lowest concentration of SARS-CoV-2 viral copies this assay can detect is 138 copies/mL. A negative  result does not preclude SARS-Cov-2 infection and should not be used as the sole basis for treatment or other patient management decisions. A negative result may occur with  improper specimen collection/handling, submission of specimen other than nasopharyngeal swab, presence of viral mutation(s) within the areas targeted by this assay, and inadequate number of viral copies(<138 copies/mL). A negative result must be combined with clinical observations, patient history, and epidemiological information. The expected result is Negative.  Fact Sheet for Patients:  BloggerCourse.com  Fact Sheet for Healthcare Providers:  SeriousBroker.it  This test is no t yet approved or cleared by the [place] FDA and  has been authorized for detection and/or diagnosis of SARS-CoV-2 by FDA under an Emergency Use Authorization (EUA). This EUA will remain  in effect (meaning this test can be used) for the duration of the COVID-19 declaration under Section 564(b)(1) of the Act, 21 U.S.C.section 360bbb-3(b)(1), unless the authorization is terminated  or revoked sooner.       Influenza A by PCR NEGATIVE NEGATIVE Final   Influenza B by PCR NEGATIVE NEGATIVE Final    Comment: (NOTE) The Xpert Xpress SARS-CoV-2/FLU/RSV plus assay is intended as an aid in the diagnosis of influenza from Nasopharyngeal swab specimens and should not be used as a sole basis for treatment. Nasal washings and aspirates are unacceptable for Xpert Xpress SARS-CoV-2/FLU/RSV testing.  Fact Sheet for Patients: BloggerCourse.com  Fact Sheet for Healthcare Providers: SeriousBroker.it  This test is not yet approved or cleared by the [place] FDA and has been authorized for detection and/or diagnosis of SARS-CoV-2 by FDA under an Emergency Use Authorization (EUA). This EUA will remain in effect (meaning  this test can be used)  for the duration of the COVID-19 declaration under Section 564(b)(1) of the Act, 21 U.S.C. section 360bbb-3(b)(1), unless the authorization is terminated or revoked.  Performed at [facility], [address] [place], [place] [address]     Procedures and diagnostic studies:  No results found.             LOS: 4 days   Rosena Bartle  [facility]   Pager on [email or web address]. If 7PM-7AM, please contact night-coverage at [email or web address]     07/25/2021, 11:43 AM

## 2021-07-25 NOTE — Progress Notes (Signed)
Physical Therapy Treatment Patient Details Name: Suzanne Avila MRN: [id number] DOB: Mar 05, 1946 Today's Date: 07/25/2021   History of Present Illness Suzanne Avila is a 75 y.o. female with medical history significant for nicotine dependence, COPD, paroxysmal atrial fibrillation, hypertension, dyslipidemia who presents to the ER via EMS for evaluation of pain in her right thigh following a fall and is found to have a distal displaced and comminuted femoral diaphyseal fracture. Underwent R IM nailing on 9/28 with Dr. Rosita Kea.    PT Comments    Pt ready for session.  Participated in exercises as described below.  Pt is able to transition to sitting with min guard.  Takes extra time in sitting then able to stand   She self limites gait to chair about 5' away.  She does well maintaining PWB and seems to be more NWB with gait.    Upon writing note, BP noted to be higher than during chart review and outside of PT safe parameters.  Returned to recheck BP 146/87.  Prior BP seemed excessively high and accuracy of reading is questioned.  Pt comfortable in chair and no c/o.    Recommendations for follow up therapy are one component of a multi-disciplinary discharge planning process, led by the attending physician.  Recommendations may be updated based on patient status, additional functional criteria and insurance authorization.  Follow Up Recommendations  SNF     Equipment Recommendations  None recommended by PT    Recommendations for Other Services       Precautions / Restrictions Precautions Precautions: Fall Restrictions Weight Bearing Restrictions: Yes RLE Weight Bearing: Partial weight bearing RLE Partial Weight Bearing Percentage or Pounds: 25% Other Position/Activity Restrictions: PWB 25% only on R LE     Mobility  Bed Mobility Overal bed mobility: Needs Assistance Bed Mobility: Supine to Sit     Supine to sit: Min guard          Transfers Overall transfer level: Needs  assistance Equipment used: Rolling walker (2 wheeled) Transfers: Sit to/from Stand Sit to Stand: Min guard            Ambulation/Gait Ambulation/Gait assistance: Min guard Gait Distance (Feet): 5 Feet Assistive device: Rolling walker (2 wheeled) Gait Pattern/deviations: Step-through pattern;Decreased weight shift to right Gait velocity: decrease   General Gait Details: walked to chair 5' away but declined further gait   Stairs             Wheelchair Mobility    Modified Rankin (Stroke Patients Only)       Balance Overall balance assessment: History of Falls;Needs assistance Sitting-balance support: No upper extremity supported;Feet supported Sitting balance-Leahy Scale: Good     Standing balance support: Bilateral upper extremity supported Standing balance-Leahy Scale: Fair Standing balance comment: Pt does require BUE support for safety while in all standing activity due to PWB and inablility to stand on one LE without UE support                            Cognition Arousal/Alertness: Awake/alert Behavior During Therapy: WFL for tasks assessed/performed Overall Cognitive Status: Within Functional Limits for tasks assessed                                        Exercises Total Joint Exercises Ankle Circles/Pumps: AROM;10 reps Quad Sets: AROM;10 reps Gluteal Sets: AROM;10 reps  Heel Slides: AROM;AAROM;10 reps Hip ABduction/ADduction: AROM;10 reps Straight Leg Raises: AROM;5 reps    General Comments        Pertinent Vitals/Pain Pain Assessment: Faces Faces Pain Scale: Hurts little more Pain Location: R knee pain Pain Descriptors / Indicators: Guarding;Grimacing Pain Intervention(s): Limited activity within patient's tolerance;Monitored during session;Premedicated before session;Repositioned    Home Living                      Prior Function            PT Goals (current goals can now be found in the care  plan section) Progress towards PT goals: Progressing toward goals    Frequency    BID      PT Plan Current plan remains appropriate    Co-evaluation              AM-PAC PT "6 Clicks" Mobility   Outcome Measure  Help needed turning from your back to your side while in a flat bed without using bedrails?: A Little Help needed moving from lying on your back to sitting on the side of a flat bed without using bedrails?: A Little Help needed moving to and from a bed to a chair (including a wheelchair)?: A Little Help needed standing up from a chair using your arms (e.g., wheelchair or bedside chair)?: A Little Help needed to walk in hospital room?: A Little Help needed climbing 3-5 steps with a railing? : A Lot 6 Click Score: 17    End of Session Equipment Utilized During Treatment: Gait belt Activity Tolerance: Patient limited by fatigue;Patient tolerated treatment well Patient left: with call bell/phone within reach;in chair;with chair alarm set Nurse Communication: Mobility status PT Visit Diagnosis: Unsteadiness on feet (R26.81);Other abnormalities of gait and mobility (R26.89);Muscle weakness (generalized) (M62.81)     Time: 5997-7414 PT Time Calculation (min) (ACUTE ONLY): 20 min  Charges:  $Gait Training: 8-22 mins $Therapeutic Exercise: 8-22 mins                    Danielle Dess, PTA 07/25/21, 9:45 AM

## 2021-07-25 NOTE — TOC Progression Note (Signed)
Transition of Care [facility]) - Progression Note    Patient Details  Name: Suzanne Avila MRN: [id number] Date of Birth: Mar 22, 1946  Transition of Care [facility]) CM/SW Contact  Liliana Cline, LCSW Phone Number: 07/25/2021, 12:31 PM  Clinical Narrative:   Per handoff, waiting to see if [facility] can accept. CSW called Verlon Au at [facility] who said they cannot accept patient due to staffing.  CSW called patient's husband with update. Provided information on other bed offers [employer], [employer] [facility], [employer], [facility]). Provided info on Medicare Care Compare site on MightyReward.[email or web address]. He would like to research and talk with patient then let staff know which they choose. Explained we need to know asap so insurance auth can be started.    Expected Discharge Plan: Skilled Nursing Facility Barriers to Discharge: Continued Medical Work up, SNF Pending bed offer  Expected Discharge Plan and Services Expected Discharge Plan: Skilled Nursing Facility       Living arrangements for the past 2 months: Single Family Home                                       Social Determinants of Health (SDOH) Interventions    Readmission Risk Interventions Readmission Risk Prevention Plan 09/16/2020  Transportation Screening Complete  PCP or Specialist Appt within 3-5 Days Complete  HRI or Home Care Consult Complete  Social Work Consult for Recovery Care Planning/Counseling Complete  Palliative Care Screening Not Applicable  Medication Review Oceanographer) Complete  Some recent data might be hidden

## 2021-07-25 NOTE — Progress Notes (Signed)
   Subjective: 4 Days Post-Op Procedure(s) (LRB): INTRAMEDULLARY (IM) RETROGRADE FEMORAL NAILING (Right) Patient reports pain as mild.   Patient is well, and has had no acute complaints or problems We will continue with physical therapy today.  No CP/SOB  Objective: Vital signs in last 24 hours: Temp:  [97.2 F (36.2 C)-98 F (36.7 C)] 97.8 F (36.6 C) (10/02 0406) Pulse Rate:  [65-82] 82 (10/02 0406) Resp:  [14-17] 16 (10/02 0406) BP: (139-153)/(62-82) 150/81 (10/02 0406) SpO2:  [93 %-100 %] 100 % (10/02 0406)  Intake/Output from previous day: 10/01 0701 - 10/02 0700 In: 600 [P.O.:600] Out: 500 [Urine:500] Intake/Output this shift: No intake/output data recorded.  Recent Labs    07/23/21 0625  HGB 8.6*   Recent Labs    07/23/21 0625  WBC 11.0*  RBC 3.09*  HCT 24.5*  PLT 289   Recent Labs    07/23/21 0625  NA 133*  K 4.8  CL 103  CO2 24  BUN 22  CREATININE 0.81  GLUCOSE 106*  CALCIUM 8.2*   No results for input(s): LABPT, INR in the last 72 hours.   EXAM General - Patient is Alert, Appropriate, and Oriented Extremity - Neurovascular intact Sensation intact distally Intact pulses distally Dorsiflexion/Plantar flexion intact Minimal swelling throughout the thigh Dressing - dressing C/D/I and no drainage, Ace wrap intact. Motor Function - intact, moving foot and toes well on exam.   Past Medical History:  Diagnosis Date   Closed left arm fracture    COPD (chronic obstructive pulmonary disease) (HCC)    Dehydration 09/15/2020   Glaucoma    Hyperlipidemia    Hypertension    Lung mass     Assessment/Plan:   4 Days Post-Op Procedure(s) (LRB): INTRAMEDULLARY (IM) RETROGRADE FEMORAL NAILING (Right) Principal Problem:   Femur fracture, right (HCC) Active Problems:   COPD (chronic obstructive pulmonary disease) (HCC)   Essential hypertension   Tobacco use disorder   AF (paroxysmal atrial fibrillation) (HCC)   Fall at home, initial encounter    AKI (acute kidney injury) (HCC)   Depression  Estimated body mass index is 20.76 kg/m as calculated from the following:   Height as of this encounter: 4\' 8"  (1.422 m).   Weight as of this encounter: 42 kg. Advance diet Up with therapy, 25% weightbearing right lower extremity Acute postop blood loss anemia -hemoglobin 8.6, s/p 1 unit PRBC 9/29. Recheck Hgb today Vital signs are stable Pain controlled + BM CM to assist with discharge to SNF  Patient will need follow-up with [facility] orthopedics in 2 weeks for x-rays and staple removal.   DVT Prophylaxis - Lovenox, TED hose, and SCDs Partial weightbearing right lower extremity 25%.  [facility], PA-C [facility]opa[facility] 07/25/2021, 7:36 AM

## 2021-07-26 LAB — SARS CORONAVIRUS 2 (TAT 6-24 HRS): SARS Coronavirus 2: NEGATIVE

## 2021-07-26 MED ORDER — SENNA 8.6 MG PO TABS: 1.0000 | ORAL_TABLET | Freq: Two times a day (BID) | ORAL | 0 refills | Status: AC

## 2021-07-26 MED ORDER — ROPINIROLE HCL 2 MG PO TABS: 2.0000 mg | ORAL_TABLET | Freq: Every day | ORAL | Status: AC

## 2021-07-26 MED ORDER — POLYETHYLENE GLYCOL 3350 17 G PO PACK: 17.0000 g | PACK | Freq: Every day | ORAL | Status: AC | PRN

## 2021-07-26 MED ORDER — DOCUSATE SODIUM 100 MG PO CAPS: 100.0000 mg | ORAL_CAPSULE | Freq: Two times a day (BID) | ORAL | Status: AC

## 2021-07-26 NOTE — Progress Notes (Signed)
   Subjective: 5 Days Post-Op Procedure(s) (LRB): INTRAMEDULLARY (IM) RETROGRADE FEMORAL NAILING (Right) Patient reports pain as mild.   Patient is well, and has had no acute complaints or problems We will continue with physical therapy today.  No CP/SOB  Objective: Vital signs in last 24 hours: Temp:  [97.4 F (36.3 C)-98.3 F (36.8 C)] 98.2 F (36.8 C) (10/03 0615) Pulse Rate:  [78-92] 90 (10/03 0615) Resp:  [16] 16 (10/02 2041) BP: (122-155)/(68-131) 122/79 (10/03 0615) SpO2:  [91 %-96 %] 93 % (10/03 0739)  Intake/Output from previous day: 10/02 0701 - 10/03 0700 In: 360 [P.O.:360] Out: 0  Intake/Output this shift: No intake/output data recorded.  Recent Labs    07/25/21 0822  HGB 12.0   Recent Labs    07/25/21 0822  WBC 10.5  RBC 4.18  HCT 34.4*  PLT 486*   No results for input(s): NA, K, CL, CO2, BUN, CREATININE, GLUCOSE, CALCIUM in the last 72 hours.  No results for input(s): LABPT, INR in the last 72 hours.   EXAM General - Patient is Alert, Appropriate, and Oriented Extremity - Neurovascular intact Sensation intact distally Intact pulses distally Dorsiflexion/Plantar flexion intact Minimal swelling throughout the thigh Dressing - dressing C/D/I and no drainage, Ace wrap intact. Motor Function - intact, moving foot and toes well on exam.   Past Medical History:  Diagnosis Date   Closed left arm fracture    COPD (chronic obstructive pulmonary disease) (HCC)    Dehydration 09/15/2020   Glaucoma    Hyperlipidemia    Hypertension    Lung mass     Assessment/Plan:   5 Days Post-Op Procedure(s) (LRB): INTRAMEDULLARY (IM) RETROGRADE FEMORAL NAILING (Right) Principal Problem:   Femur fracture, right (HCC) Active Problems:   COPD (chronic obstructive pulmonary disease) (HCC)   Essential hypertension   Tobacco use disorder   AF (paroxysmal atrial fibrillation) (HCC)   Fall at home, initial encounter   AKI (acute kidney injury) (HCC)    Depression  Estimated body mass index is 20.76 kg/m as calculated from the following:   Height as of this encounter: 4\' 8"  (1.422 m).   Weight as of this encounter: 42 kg. Advance diet Up with therapy, 25% weightbearing right lower extremity Acute postop blood loss anemia -hemoglobin 8.6, s/p 1 unit PRBC 9/29. Hgb 12.0 Vital signs are stable Pain controlled + BM CM to assist with discharge to SNF  Patient will need follow-up with [facility] orthopedics in 2 weeks for x-rays and staple removal.   DVT Prophylaxis - Lovenox, TED hose, and SCDs Partial weightbearing right lower extremity 25%.  [facility], PA-C [facility] 07/26/2021, 8:08 AM

## 2021-07-26 NOTE — TOC Progression Note (Addendum)
Transition of Care [facility]) - Progression Note    Patient Details  Name: KAMIRAH SHUGRUE MRN: [id number] Date of Birth: 1945-11-25  Transition of Care [facility]) CM/SW Contact  Barrie Dunker, RN Phone Number: 07/26/2021, 12:44 PM  Clinical Narrative:    [place] ins approval to go to [facility], [id number] Patient going to room 104 [facility] was called for transport, Onalee Hua the patient's spouse was made aware  Expected Discharge Plan: Skilled Nursing Facility Barriers to Discharge: Continued Medical Work up, SNF Pending bed offer  Expected Discharge Plan and Services Expected Discharge Plan: Skilled Nursing Facility       Living arrangements for the past 2 months: Single Family Home                                       Social Determinants of Health (SDOH) Interventions    Readmission Risk Interventions Readmission Risk Prevention Plan 09/16/2020  Transportation Screening Complete  PCP or Specialist Appt within 3-5 Days Complete  HRI or Home Care Consult Complete  Social Work Consult for Recovery Care Planning/Counseling Complete  Palliative Care Screening Not Applicable  Medication Review Oceanographer) Complete  Some recent data might be hidden

## 2021-07-26 NOTE — TOC Progression Note (Addendum)
Transition of Care [facility]) - Progression Note    Patient Details  Name: Suzanne Avila MRN: [id number] Date of Birth: April 26, 1946  Transition of Care [facility]) CM/SW [place], RN Phone Number: 07/26/2021, 9:34 AM  Clinical Narrative:   Met with the patient in the room at the bedside, Discussed bed choices and she wants to go to [facility], I explained that we need to get insurance approval, I will notify her once approved, I notified [facility] of the bed choice  Sent the clinical notes to [facility] ref number [id number],     Expected Discharge Plan: [place] Barriers to Discharge: Continued Medical Work up, SNF Pending bed offer  Expected Discharge Plan and Services Expected Discharge Plan: [place] arrangements for the past 2 months: Single Family Home                                       Social Determinants of Health (SDOH) Interventions    Readmission Risk Interventions Readmission Risk Prevention Plan 09/16/2020  Transportation Screening Complete  PCP or Specialist Appt within 3-5 Days Complete  HRI or Home Care Consult Complete  Social Work Consult for [place] Planning/Counseling Complete  Palliative Care Screening Not Applicable  Medication Review Press photographer) Complete  Some recent data might be hidden

## 2021-07-26 NOTE — Progress Notes (Signed)
Physical Therapy Treatment Patient Details Name: Suzanne Avila MRN: [id number] DOB: 02/18/1946 Today's Date: 07/26/2021   History of Present Illness SHONTA BOURQUE is a 75 y.o. female with medical history significant for nicotine dependence, COPD, paroxysmal atrial fibrillation, hypertension, dyslipidemia who presents to the ER via EMS for evaluation of pain in her right thigh following a fall and is found to have a distal displaced and comminuted femoral diaphyseal fracture. Underwent R IM nailing on 9/28 with Dr. Rosita Kea.    PT Comments    ROM R knee in sitting and able to walk a small lap in room with RW and min guard.  Gait limited by general fatigue and UE soreness.  R knee ROM progressing to about 80 degrees flexion.   Recommendations for follow up therapy are one component of a multi-disciplinary discharge planning process, led by the attending physician.  Recommendations may be updated based on patient status, additional functional criteria and insurance authorization.  Follow Up Recommendations  SNF     Equipment Recommendations  None recommended by PT    Recommendations for Other Services       Precautions / Restrictions Precautions Precautions: Fall Restrictions Weight Bearing Restrictions: Yes RLE Weight Bearing: Partial weight bearing RLE Partial Weight Bearing Percentage or Pounds: 25% Other Position/Activity Restrictions: PWB 25% only on R LE     Mobility  Bed Mobility Overal bed mobility: Needs Assistance Bed Mobility: Supine to Sit     Supine to sit: Supervision     General bed mobility comments: up in chair before and after    Transfers Overall transfer level: Needs assistance Equipment used: Rolling walker (2 wheeled) Transfers: Sit to/from Stand Sit to Stand: Min guard Stand pivot transfers: Min guard       General transfer comment: PRN VC for sequencing to maintain PWBing  Ambulation/Gait Ambulation/Gait assistance: Min guard Gait  Distance (Feet): 12 Feet Assistive device: Rolling walker (2 wheeled) Gait Pattern/deviations: Step-through pattern;Decreased weight shift to right Gait velocity: decreased   General Gait Details: self limits due to fatiuge/UE sorenss   Stairs             Wheelchair Mobility    Modified Rankin (Stroke Patients Only)       Balance Overall balance assessment: History of Falls;Needs assistance Sitting-balance support: No upper extremity supported;Feet supported Sitting balance-Leahy Scale: Good     Standing balance support: Bilateral upper extremity supported Standing balance-Leahy Scale: Fair Standing balance comment: Pt does require BUE support for safety while in all standing activity due to PWB and inablility to stand on one LE without UE support                            Cognition Arousal/Alertness: Awake/alert Behavior During Therapy: WFL for tasks assessed/performed Overall Cognitive Status: No family/caregiver present to determine baseline cognitive functioning                                 General Comments: alert and oriented, pt reports feeling confused, able to follow commands well      Exercises Other Exercises Other Exercises: AROM R knee in sitting    General Comments        Pertinent Vitals/Pain Pain Assessment: Faces Faces Pain Scale: Hurts even more Pain Location: R knee pain Pain Descriptors / Indicators: Guarding;Grimacing Pain Intervention(s): Limited activity within patient's tolerance;Monitored during session;Premedicated before session  Home Living                      Prior Function            PT Goals (current goals can now be found in the care plan section) Acute Rehab PT Goals Patient Stated Goal: feel better and go back home Progress towards PT goals: Progressing toward goals    Frequency    BID      PT Plan Current plan remains appropriate    Co-evaluation               AM-PAC PT "6 Clicks" Mobility   Outcome Measure  Help needed turning from your back to your side while in a flat bed without using bedrails?: A Little Help needed moving from lying on your back to sitting on the side of a flat bed without using bedrails?: A Little Help needed moving to and from a bed to a chair (including a wheelchair)?: A Little Help needed standing up from a chair using your arms (e.g., wheelchair or bedside chair)?: A Little Help needed to walk in hospital room?: A Little Help needed climbing 3-5 steps with a railing? : A Lot 6 Click Score: 17    End of Session Equipment Utilized During Treatment: Gait belt Activity Tolerance: Patient limited by fatigue;Patient tolerated treatment well Patient left: with call bell/phone within reach;in chair;with chair alarm set Nurse Communication: Mobility status PT Visit Diagnosis: Unsteadiness on feet (R26.81);Other abnormalities of gait and mobility (R26.89);Muscle weakness (generalized) (M62.81)     Time: 3254-9826 PT Time Calculation (min) (ACUTE ONLY): 11 min  Charges:  $Gait Training: 8-22 mins                    Danielle Dess, PTA 07/26/21, 11:46 AM

## 2021-07-26 NOTE — Care Management Important Message (Signed)
Important Message  Patient Details  Name: Suzanne Avila MRN: [id number] Date of Birth: [birth year]   Medicare Important Message Given:  Yes     Bernadette Hoit 07/26/2021, 2:15 PM

## 2021-07-26 NOTE — Plan of Care (Signed)
Patient discharged per MD orders at this time.All discharged instructions,education and medications reviewed with patient at bedside.Pt expressed understanding and will comply with dc instructions.follow up appointments was also communicated to patient.no verbal c/o or any ssx of distress.patient was discharged to [facility] place rehabilitation for PT/OT and nursing services per order.report was called to Ms Lavada Mesi coordinator and message left for floor nurse to call back for any concerns.patient was transported by 2 EMS personnel on a stretcher.

## 2021-07-26 NOTE — Progress Notes (Signed)
Occupational Therapy Treatment Patient Details Name: Suzanne Avila MRN: [id number] DOB: 1946-05-29 Today's Date: 07/26/2021   History of present illness Suzanne Avila is a 75 y.o. female with medical history significant for nicotine dependence, COPD, paroxysmal atrial fibrillation, hypertension, dyslipidemia who presents to the ER via EMS for evaluation of pain in her right thigh following a fall and is found to have a distal displaced and comminuted femoral diaphyseal fracture. Underwent R IM nailing on 9/28 with Dr. Rosita Kea.   OT comments  Pt seen for OT tx. Pt received in bed, picking at breakfast items, and agreeable to tx. Pt performed bed mobility with supervision and using her BUE to support her RLE out to the side of the bed in preparation for sup>sit. Pt performed STS and SPT + RW to [facility] with CGA and PRN VC for sequencing to support adherence to PWBing RLE. Pt required set up and supervision for lateral lean pericare after toileting. Pt left seated on BSC after toileting with PTA for continued therapy. Pt continues to benefit from skilled OT Services. Continue to recommend short term rehab to maximize return to PLOF.    Recommendations for follow up therapy are one component of a multi-disciplinary discharge planning process, led by the attending physician.  Recommendations may be updated based on patient status, additional functional criteria and insurance authorization.    Follow Up Recommendations  SNF    Equipment Recommendations  3 in 1 bedside commode    Recommendations for Other Services      Precautions / Restrictions Precautions Precautions: Fall Restrictions Weight Bearing Restrictions: Yes RLE Weight Bearing: Partial weight bearing RLE Partial Weight Bearing Percentage or Pounds: 25% Other Position/Activity Restrictions: PWB 25% only on R LE       Mobility Bed Mobility Overal bed mobility: Needs Assistance Bed Mobility: Supine to Sit     Supine to sit:  Supervision     General bed mobility comments: on commode upon arrival    Transfers Overall transfer level: Needs assistance Equipment used: Rolling walker (2 wheeled) Transfers: Sit to/from [employer] Sit to Stand: Min guard Stand pivot transfers: Min guard       General transfer comment: PRN VC for sequencing to maintain PWBing    Balance Overall balance assessment: History of Falls;Needs assistance Sitting-balance support: No upper extremity supported;Feet supported Sitting balance-Leahy Scale: Good     Standing balance support: Bilateral upper extremity supported Standing balance-Leahy Scale: Fair Standing balance comment: Pt does require BUE support for safety while in all standing activity due to PWB and inablility to stand on one LE without UE support                           ADL either performed or assessed with clinical judgement   ADL Overall ADL's : Needs assistance/impaired                         Toilet Transfer: RW;Stand-pivot;BSC;Min Pension scheme manager Details (indicate cue type and reason): pt able to maintain 25% PWBing RLE with PRN VC for sequencing Toileting- Clothing Manipulation and Hygiene: Sitting/lateral lean;Set up;Supervision/safety               Vision       Perception     Praxis      Cognition Arousal/Alertness: Awake/alert Behavior During Therapy: WFL for tasks assessed/performed Overall Cognitive Status: No family/caregiver present to determine baseline cognitive functioning  General Comments: alert and oriented, pt reports feeling confused, able to follow commands well        Exercises    Shoulder Instructions       General Comments      Pertinent Vitals/ Pain       Pain Assessment: Faces Faces Pain Scale: Hurts a little bit Pain Location: R knee pain Pain Descriptors / Indicators: Guarding;Grimacing Pain Intervention(s):  Limited activity within patient's tolerance;Monitored during session;Premedicated before session  Home Living                                          Prior Functioning/Environment              Frequency  Min 2X/week        Progress Toward Goals  OT Goals(current goals can now be found in the care plan section)  Progress towards OT goals: Progressing toward goals  Acute Rehab OT Goals Patient Stated Goal: feel better and go back home OT Goal Formulation: With patient Time For Goal Achievement: 08/05/21 Potential to Achieve Goals: Good  Plan Discharge plan remains appropriate;Frequency remains appropriate    Co-evaluation                 AM-PAC OT "6 Clicks" Daily Activity     Outcome Measure   Help from another person eating meals?: None Help from another person taking care of personal grooming?: None Help from another person toileting, which includes using toliet, bedpan, or urinal?: A Little Help from another person bathing (including washing, rinsing, drying)?: A Lot Help from another person to put on and taking off regular upper body clothing?: None Help from another person to put on and taking off regular lower body clothing?: A Lot 6 Click Score: 19    End of Session Equipment Utilized During Treatment: Gait belt  OT Visit Diagnosis: Other abnormalities of gait and mobility (R26.89);History of falling (Z91.81);Muscle weakness (generalized) (M62.81);Pain Pain - Right/Left: Right Pain - part of body: Hip;Knee;Leg   Activity Tolerance Patient tolerated treatment well   Patient Left Other (comment) (seated on [employer] after toileting, PTA in room for session)   Nurse Communication          Time: 2263-3354 OT Time Calculation (min): 14 min  Charges: OT General Charges $OT Visit: 1 Visit OT Treatments $Self Care/Home Management : 8-22 mins  Arman Filter., MPH, MS, OTR/L ascom [phone] 07/26/21, 10:43 AM

## 2021-07-26 NOTE — Progress Notes (Signed)
Patient alert and oriented x 4, complains of surgical pain to right hip relieved with scheduled pain medications. Zofran provided x 1 due to complaints of nausea and vomiting. Dressing to surgical site remains clean, dry and intact. Vitals stable, no respiratory distress on room air. Will continue to monitor.

## 2021-07-26 NOTE — Discharge Summary (Signed)
Physician Discharge Summary  Suzanne Avila VZD:[id number] DOB: December 29, 1945 DOA: 07/21/2021  PCP: Erasmo Downer, MD  Admit date: 07/21/2021 Discharge date: 07/26/2021  Discharge disposition: SNF   Recommendations for Outpatient Follow-Up:   Follow-up with physician at the nursing home within 3 days of discharge.  Follow-up with orthopedic surgeon, Dr. Rosita Kea, in 2 weeks.   Discharge Diagnosis:   Principal Problem:   Femur fracture, right (HCC) Active Problems:   COPD (chronic obstructive pulmonary disease) (HCC)   Essential hypertension   Tobacco use disorder   AF (paroxysmal atrial fibrillation) (HCC)   Fall at home, initial encounter   AKI (acute kidney injury) [facility])   Depression    Discharge Condition: Stable.  Diet recommendation:  Diet Order             Diet - low sodium heart healthy           Diet regular Room service appropriate? Yes; Fluid consistency: Thin  Diet effective now                     Code Status: Full Code     Hospital Course:   [name] is a 75 y.o. female with past medical history significant for paroxysmal atrial fibrillation, chronic diastolic CHF, COPD, tobacco use disorder, hypertension, dyslipidemia, right lung abscess, left arm fracture, who presented to the hospital because of pain in the right thigh following a fall.   She was found to have comminuted right femur fracture.  She was treated with analgesics.  She underwent intramedullary retrograde femoral nailing of right femur fracture on 07/21/2021.  She had AKI with hyperkalemia that were successfully treated.  She also had mild hyponatremia and she is asymptomatic from this.  She was evaluated by PT and OT recommended further rehabilitation at the skilled nursing facility.  Her condition has improved and she is deemed stable for discharge to SNF today.      Medical Consultants:   Orthopedic surgeon   Discharge Exam:    Vitals:   07/26/21 0615  07/26/21 0739 07/26/21 0826 07/26/21 1200  BP: 122/79  140/78 (!) 106/54  Pulse: 90  85 82  Resp:   14 16  Temp: 98.2 F (36.8 C)  98.2 F (36.8 C) 98.4 F (36.9 C)  TempSrc: Oral   Oral  SpO2: 95% 93% 93%   Weight:      Height:         GEN: NAD SKIN: No rash EYES: EOMI ENT: MMM CV: RRR PULM: CTA B ABD: soft, ND, NT, +BS CNS: AAO x 3, non focal EXT: Dressing on right lower extremity is clean, dry and intact.   The results of significant diagnostics from this hospitalization (including imaging, microbiology, ancillary and laboratory) are listed below for reference.     Procedures and Diagnostic Studies:   DG Chest Portable 1 View  Result Date: 07/21/2021 CLINICAL DATA:  75 year old female under preoperative evaluation. History of COPD. EXAM: PORTABLE CHEST 1 VIEW COMPARISON:  Chest x-ray 07/15/2020. FINDINGS: Linear area of scarring in the right upper lobe at the site of the previously noted large cavitary lesion. Nodular density projecting over the lateral aspect of the left lower lung, favored to represent a prominent nipple shadow. Lungs are otherwise clear. Skin fold artifact in the lower left hemithorax. No definite pneumothorax. No evidence of pulmonary edema. No pleural effusions. Heart size is normal. Upper mediastinal contours are within normal limits. Atherosclerotic calcifications in the thoracic  aorta. IMPRESSION: 1. No radiographic evidence of acute cardiopulmonary disease. 2. Chronic scarring in the right upper lobe at site of prior cavitary lesion. 3. Probable nipple shadow projecting over the lower left lung. Follow-up standing PA and lateral chest radiograph with nipple markers in place is recommended in the near future to better evaluate this finding. 4. Aortic atherosclerosis. Electronically Signed   By: Trudie Reed M.D.   On: 07/21/2021 07:03   DG C-Arm 1-60 Min  Result Date: 07/21/2021 CLINICAL DATA:  ORIF right femoral fracture EXAM: RIGHT FEMUR 2 VIEWS;  DG C-ARM 1-60 MIN COMPARISON:  07/21/2021 FINDINGS: There are 13 fluoroscopic images obtained during the performance of the procedure and provided for interpretation only. Images demonstrate intramedullary rod with proximal and distal interlocking screws traversing the comminuted femoral fracture seen previously. No evidence of complication. Please refer to the operative report. FLUOROSCOPY TIME:  3 minutes 30 seconds IMPRESSION: 1. ORIF right femoral fracture. Electronically Signed   By: Sharlet Salina M.D.   On: 07/21/2021 18:33   DG Hip Unilat W or Wo Pelvis 2-3 Views Right  Result Date: 07/21/2021 CLINICAL DATA:  75 year old female with history of trauma from a fall with history of right-sided hip pain. EXAM: DG HIP (WITH OR WITHOUT PELVIS) 2-3V RIGHT COMPARISON:  No priors. FINDINGS: AP view of the bony pelvis and AP and lateral views of the right hip demonstrate no acute displaced fracture of the bony pelvic ring. Right femoral head is located. Proximal right femur is intact (distal femoral fracture described separately). Areas of sclerosis are noted in the femoral heads bilaterally, suggesting avascular necrosis. There is also joint space narrowing, subchondral sclerosis, subchondral cyst formation and osteophyte formation in the hip joints bilaterally indicative of moderate bilateral hip joint osteoarthritis. Orthopedic fixation hardware in the lumbosacral spine incompletely imaged. IMPRESSION: 1. No acute radiographic abnormality of the bony pelvic ring or right hip. 2. Moderate bilateral hip joint osteoarthritis. 3. 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 4. Distal displaced and comminuted femoral diaphyseal fracture separately described. Electronically Signed   By: Trudie Reed M.D.   On: 07/21/2021 07:01   DG FEMUR, MIN 2 VIEWS RIGHT  Result Date: 07/21/2021 CLINICAL DATA:  ORIF right femoral fracture EXAM: RIGHT FEMUR 2 VIEWS; DG C-ARM 1-60 MIN COMPARISON:   07/21/2021 FINDINGS: There are 13 fluoroscopic images obtained during the performance of the procedure and provided for interpretation only. Images demonstrate intramedullary rod with proximal and distal interlocking screws traversing the comminuted femoral fracture seen previously. No evidence of complication. Please refer to the operative report. FLUOROSCOPY TIME:  3 minutes 30 seconds IMPRESSION: 1. ORIF right femoral fracture. Electronically Signed   By: Sharlet Salina M.D.   On: 07/21/2021 18:33   DG FEMUR PORT, MIN 2 VIEWS RIGHT  Result Date: 07/21/2021 CLINICAL DATA:  ORIF right femoral fracture EXAM: RIGHT FEMUR PORTABLE 2 VIEW COMPARISON:  07/21/2021 FINDINGS: Frontal and cross-table lateral views of the right femur are obtained. Intramedullary rod with proximal and distal interlocking screws traverses a comminuted femoral diaphyseal fracture. Alignment is near anatomic. Postsurgical changes are seen in the soft tissues. IMPRESSION: 1. ORIF comminuted femoral diaphyseal fracture with near anatomic alignment. Electronically Signed   By: Sharlet Salina M.D.   On: 07/21/2021 18:34   DG Femur Portable Min 2 Views Right  Result Date: 07/21/2021 CLINICAL DATA:  75 year old female with history of trauma from a fall. Right-sided leg pain. EXAM: RIGHT FEMUR PORTABLE 2 VIEW COMPARISON:  No priors.  FINDINGS: Multiple views of the right femur demonstrate an acute displaced comminuted spiral fracture of the distal third of the femoral diaphysis with approximately one shaft width of posterior displacement of the distal fracture fragment. Large triangular-shaped separate fracture fragment also noted. Numerous vascular calcifications are noted. IMPRESSION: 1. Acute displaced comminuted spiral fracture of the distal third of the femoral diaphysis, as above. 2. Atherosclerosis. Electronically Signed   By: Trudie Reed M.D.   On: 07/21/2021 06:59     Labs:   Basic Metabolic Panel: Recent Labs  Lab  07/21/21 0553 07/22/21 0601 07/23/21 0625  NA 130* 131* 133*  K 5.2* 4.9 4.8  CL 94* 101 103  CO2 22 24 24   GLUCOSE 103* 210* 106*  BUN 38* 24* 22  CREATININE 1.47* 0.95 0.81  CALCIUM 8.7* 8.3* 8.2*   GFR Estimated Creatinine Clearance: 33.9 mL/min (by C-G formula based on SCr of 0.81 mg/dL). Liver Function Tests: No results for input(s): AST, ALT, ALKPHOS, BILITOT, PROT, ALBUMIN in the last 168 hours. No results for input(s): LIPASE, AMYLASE in the last 168 hours. No results for input(s): AMMONIA in the last 168 hours. Coagulation profile Recent Labs  Lab 07/21/21 0553  INR 1.0    CBC: Recent Labs  Lab 07/21/21 0553 07/22/21 0601 07/23/21 0625 07/25/21 0822  WBC 10.8* 6.2 11.0* 10.5  NEUTROABS 8.5*  --  8.8*  --   HGB 10.3* 7.2* 8.6* 12.0  HCT 30.5* 21.7* 24.5* 34.4*  MCV 81.1 82.2 79.3* 82.3  PLT 289 257 289 486*   Cardiac Enzymes: No results for input(s): CKTOTAL, CKMB, CKMBINDEX, TROPONINI in the last 168 hours. BNP: Invalid input(s): POCBNP CBG: No results for input(s): GLUCAP in the last 168 hours. D-Dimer No results for input(s): DDIMER in the last 72 hours. Hgb A1c No results for input(s): HGBA1C in the last 72 hours. Lipid Profile No results for input(s): CHOL, HDL, LDLCALC, TRIG, CHOLHDL, LDLDIRECT in the last 72 hours. Thyroid function studies No results for input(s): TSH, T4TOTAL, T3FREE, THYROIDAB in the last 72 hours.  Invalid input(s): FREET3 Anemia work up No results for input(s): VITAMINB12, FOLATE, FERRITIN, TIBC, IRON, RETICCTPCT in the last 72 hours. Microbiology Recent Results (from the past 240 hour(s))  Resp Panel by RT-PCR (Flu A&B, Covid) Nasopharyngeal Swab     Status: None   Collection Time: 07/21/21  5:53 AM   Specimen: Nasopharyngeal Swab; Nasopharyngeal(NP) swabs in vial transport medium  Result Value Ref Range Status   SARS Coronavirus 2 by RT PCR NEGATIVE NEGATIVE Final    Comment: (NOTE) SARS-CoV-2 target nucleic acids  are NOT DETECTED.  The SARS-CoV-2 RNA is generally detectable in upper respiratory specimens during the acute phase of infection. The lowest concentration of SARS-CoV-2 viral copies this assay can detect is 138 copies/mL. A negative result does not preclude SARS-Cov-2 infection and should not be used as the sole basis for treatment or other patient management decisions. A negative result may occur with  improper specimen collection/handling, submission of specimen other than nasopharyngeal swab, presence of viral mutation(s) within the areas targeted by this assay, and inadequate number of viral copies(<138 copies/mL). A negative result must be combined with clinical observations, patient history, and epidemiological information. The expected result is Negative.  Fact Sheet for Patients:  BloggerCourse.com  Fact Sheet for Healthcare Providers:  SeriousBroker.it  This test is no t yet approved or cleared by the [place] FDA and  has been authorized for detection and/or diagnosis of SARS-CoV-2 by FDA under  an Emergency Use Authorization (EUA). This EUA will remain  in effect (meaning this test can be used) for the duration of the COVID-19 declaration under Section 564(b)(1) of the Act, 21 U.S.C.section 360bbb-3(b)(1), unless the authorization is terminated  or revoked sooner.       Influenza A by PCR NEGATIVE NEGATIVE Final   Influenza B by PCR NEGATIVE NEGATIVE Final    Comment: (NOTE) The Xpert Xpress SARS-CoV-2/FLU/RSV plus assay is intended as an aid in the diagnosis of influenza from Nasopharyngeal swab specimens and should not be used as a sole basis for treatment. Nasal washings and aspirates are unacceptable for Xpert Xpress SARS-CoV-2/FLU/RSV testing.  Fact Sheet for Patients: BloggerCourse.com  Fact Sheet for Healthcare Providers: SeriousBroker.it  This test is  not yet approved or cleared by the [place] FDA and has been authorized for detection and/or diagnosis of SARS-CoV-2 by FDA under an Emergency Use Authorization (EUA). This EUA will remain in effect (meaning this test can be used) for the duration of the COVID-19 declaration under Section 564(b)(1) of the Act, 21 U.S.C. section 360bbb-3(b)(1), unless the authorization is terminated or revoked.  Performed at [facility], [address], [place], [place] [address]      Discharge Instructions:   Discharge Instructions     Diet - low sodium heart healthy   Complete by: As directed    Discharge wound care:   Complete by: As directed    Follow-up with orthopedic surgeon in 2 weeks   Increase activity slowly   Complete by: As directed       Allergies as of 07/26/2021       Reactions   Codeine Itching   Cyclobenzaprine Itching   Penicillins    Itching without rash per patient.  Unable to say when it happened, other than "a long time ago"        Medication List     STOP taking these medications    fluconazole 100 MG tablet Commonly known as: Diflucan   meloxicam 15 MG tablet Commonly known as: MOBIC       TAKE these medications    albuterol (2.5 MG/3ML) 0.083% nebulizer solution Commonly known as: PROVENTIL Take 3 mLs (2.5 mg total) by nebulization every 6 (six) hours as needed for wheezing or shortness of breath.   albuterol 108 (90 Base) MCG/ACT inhaler Commonly known as: VENTOLIN HFA Inhale 2 puffs into the lungs every 6 (six) hours as needed for wheezing or shortness of breath.   atorvastatin 40 MG tablet Commonly known as: LIPITOR Take 1 tablet (40 mg total) by mouth daily.   diltiazem 120 MG tablet Commonly known as: CARDIZEM Take 1 tablet (120 mg total) by mouth daily.   docusate sodium 100 MG capsule Commonly known as: COLACE Take 1 capsule (100 mg total) by mouth 2 (two) times daily.   dorzolamide 2 % ophthalmic  solution Commonly known as: TRUSOPT Place 1 drop into both eyes 2 (two) times daily.   enoxaparin 40 MG/0.4ML injection Commonly known as: LOVENOX Inject 0.4 mLs (40 mg total) into the skin daily for 14 days.   HYDROcodone-acetaminophen 7.5-325 MG tablet Commonly known as: Norco Take 1 tablet by mouth every 6 (six) hours as needed for severe pain. Must last 30 days Start taking on: August 02, 2021 What changed: Another medication with the same name was removed. Continue taking this medication, and follow the directions you see here.   HYDROcodone-acetaminophen 7.5-325 MG tablet Commonly known as: Norco Take 1  tablet by mouth every 6 (six) hours as needed for severe pain. Must last 30 days Start taking on: September 01, 2021 What changed: Another medication with the same name was removed. Continue taking this medication, and follow the directions you see here.   latanoprost 0.005 % ophthalmic solution Commonly known as: XALATAN Place 1 drop into both eyes at bedtime.   magnesium gluconate 500 MG tablet Commonly known as: MAGONATE Take 500 mg by mouth daily.   mirtazapine 7.5 MG tablet Commonly known as: REMERON Take 1 tablet (7.5 mg total) by mouth at bedtime.   Multi-Vitamin tablet Take 1 tablet by mouth daily.   omeprazole 40 MG capsule Commonly known as: PRILOSEC Take 1 capsule (40 mg total) by mouth daily.   polyethylene glycol 17 g packet Commonly known as: MIRALAX / GLYCOLAX Take 17 g by mouth daily as needed for mild constipation.   pregabalin 150 MG capsule Commonly known as: LYRICA Take 1 capsule (150 mg total) by mouth every 8 (eight) hours.   ramipril 2.5 MG capsule Commonly known as: Altace Take 1 capsule (2.5 mg total) by mouth daily.   rOPINIRole 2 MG tablet Commonly known as: Requip Take 1 tablet (2 mg total) by mouth at bedtime.   senna 8.6 MG Tabs tablet Commonly known as: SENOKOT Take 1 tablet (8.6 mg total) by mouth 2 (two) times daily for 7  days.   spironolactone 25 MG tablet Commonly known as: Aldactone Take 1 tablet (25 mg total) by mouth daily.   Stiolto Respimat 2.5-2.5 MCG/ACT Aers Generic drug: Tiotropium Bromide-Olodaterol Inhale 2 puffs into the lungs daily.   tizanidine 2 MG capsule Commonly known as: ZANAFLEX Take 1 capsule (2 mg total) by mouth 3 (three) times daily as needed for muscle spasms.   torsemide 20 MG tablet Commonly known as: DEMADEX TAKE 2 TABLETS TWICE A DAY   traMADol 50 MG tablet Commonly known as: ULTRAM Take 1 tablet (50 mg total) by mouth every 6 (six) hours as needed.   vitamin B-12 500 MCG tablet Commonly known as: CYANOCOBALAMIN Take 1 tablet (500 mcg total) by mouth daily.               Discharge Care Instructions  (From admission, onward)           Start     Ordered   07/26/21 0000  Discharge wound care:       Comments: Follow-up with orthopedic surgeon in 2 weeks   07/26/21 1430            Contact information for follow-up providers     Evon Slack, PA-C Follow up in 2 week(s).   Specialties: Orthopedic Surgery, Emergency Medicine Contact information: [address] [place] [address] [phone]              Contact information for after-discharge care     Destination     [facility] .   Service: Skilled Nursing Contact information: [address] [place] [address] [phone]                       If you experience worsening of your admission symptoms, develop shortness of breath, life threatening emergency, suicidal or homicidal thoughts you must seek medical attention immediately by calling 911 or calling your MD immediately  if symptoms less severe.   You must read complete instructions/literature along with all the possible adverse reactions/side effects for all the medicines  you take and that have been prescribed to you. Take any new medicines after you have  completely understood and accept all the possible adverse reactions/side effects.    Please note   You were cared for by a hospitalist during your hospital stay. If you have any questions about your discharge medications or the care you received while you were in the hospital after you are discharged, you can call the unit and asked to speak with the hospitalist on call if the hospitalist that took care of you is not available. Once you are discharged, your primary care physician will handle any further medical issues. Please note that NO REFILLS for any discharge medications will be authorized once you are discharged, as it is imperative that you return to your primary care physician (or establish a relationship with a primary care physician if you do not have one) for your aftercare needs so that they can reassess your need for medications and monitor your lab values.       Time coordinating discharge: 33 minutes  Signed:  Saachi Zale  [facility] 07/26/2021, 2:42 PM   Pager on [email or web address]. If 7PM-7AM, please contact night-coverage at [email or web address]

## 2021-07-26 NOTE — Progress Notes (Signed)
Physical Therapy Treatment Patient Details Name: Suzanne Avila MRN: [id number] DOB: 1946/02/24 Today's Date: 07/26/2021   History of Present Illness Suzanne Avila is a 75 y.o. female with medical history significant for nicotine dependence, COPD, paroxysmal atrial fibrillation, hypertension, dyslipidemia who presents to the ER via EMS for evaluation of pain in her right thigh following a fall and is found to have a distal displaced and comminuted femoral diaphyseal fracture. Underwent R IM nailing on 9/28 with Dr. Rosita Kea.    PT Comments    Pt ready for session.  On commode with OT upon arrival.  Stood and is able to walk 10' in room with RW and min guard.  Continues to self limit gait distances and does not put much if any weight on LLE despite 25% WB.  Seated and supine AROM.   Recommendations for follow up therapy are one component of a multi-disciplinary discharge planning process, led by the attending physician.  Recommendations may be updated based on patient status, additional functional criteria and insurance authorization.  Follow Up Recommendations  SNF     Equipment Recommendations  None recommended by PT    Recommendations for Other Services       Precautions / Restrictions Precautions Precautions: Fall Restrictions Weight Bearing Restrictions: Yes RLE Weight Bearing: Partial weight bearing RLE Partial Weight Bearing Percentage or Pounds: 25% Other Position/Activity Restrictions: PWB 25% only on R LE     Mobility  Bed Mobility               General bed mobility comments: on commode upon arrival    Transfers Overall transfer level: Needs assistance Equipment used: Rolling walker (2 wheeled) Transfers: Sit to/from Stand Sit to Stand: Min guard            Ambulation/Gait Ambulation/Gait assistance: Min guard Gait Distance (Feet): 10 Feet Assistive device: Rolling walker (2 wheeled) Gait Pattern/deviations: Step-through pattern;Decreased weight  shift to right Gait velocity: decrease       Stairs             Wheelchair Mobility    Modified Rankin (Stroke Patients Only)       Balance Overall balance assessment: History of Falls;Needs assistance Sitting-balance support: No upper extremity supported;Feet supported Sitting balance-Leahy Scale: Good     Standing balance support: Bilateral upper extremity supported Standing balance-Leahy Scale: Fair Standing balance comment: Pt does require BUE support for safety while in all standing activity due to PWB and inablility to stand on one LE without UE support                            Cognition Arousal/Alertness: Awake/alert Behavior During Therapy: WFL for tasks assessed/performed Overall Cognitive Status: Within Functional Limits for tasks assessed                                        Exercises Other Exercises Other Exercises: supine and seated AROM    General Comments        Pertinent Vitals/Pain Pain Assessment: Faces Faces Pain Scale: Hurts a little bit Pain Location: R knee pain Pain Descriptors / Indicators: Guarding;Grimacing Pain Intervention(s): Limited activity within patient's tolerance;Monitored during session;Premedicated before session    Home Living                      Prior Function  PT Goals (current goals can now be found in the care plan section) Progress towards PT goals: Progressing toward goals    Frequency    BID      PT Plan Current plan remains appropriate    Co-evaluation              AM-PAC PT "6 Clicks" Mobility   Outcome Measure  Help needed turning from your back to your side while in a flat bed without using bedrails?: A Little Help needed moving from lying on your back to sitting on the side of a flat bed without using bedrails?: A Little Help needed moving to and from a bed to a chair (including a wheelchair)?: A Little Help needed standing up from  a chair using your arms (e.g., wheelchair or bedside chair)?: A Little Help needed to walk in hospital room?: A Little Help needed climbing 3-5 steps with a railing? : A Lot 6 Click Score: 17    End of Session Equipment Utilized During Treatment: Gait belt Activity Tolerance: Patient limited by fatigue;Patient tolerated treatment well Patient left: with call bell/phone within reach;in chair;with chair alarm set Nurse Communication: Mobility status PT Visit Diagnosis: Unsteadiness on feet (R26.81);Other abnormalities of gait and mobility (R26.89);Muscle weakness (generalized) (M62.81)     Time: 4650-3546 PT Time Calculation (min) (ACUTE ONLY): 10 min  Charges:  $Gait Training: 8-22 mins                    Danielle Dess, PTA 07/26/21, 9:45 AM

## 2021-08-15 ENCOUNTER — Encounter: Payer: Self-pay | Admitting: Emergency Medicine

## 2021-08-15 ENCOUNTER — Other Ambulatory Visit: Payer: Self-pay

## 2021-08-15 ENCOUNTER — Inpatient Hospital Stay
Admission: EM | Admit: 2021-08-15 | Discharge: 2021-09-23 | DRG: 870 | Disposition: E | Payer: Medicare Other | Attending: Internal Medicine | Admitting: Internal Medicine

## 2021-08-15 ENCOUNTER — Emergency Department: Payer: Medicare Other

## 2021-08-15 DIAGNOSIS — R6521 Severe sepsis with septic shock: Secondary | ICD-10-CM | POA: Diagnosis present

## 2021-08-15 DIAGNOSIS — S7291XA Unspecified fracture of right femur, initial encounter for closed fracture: Secondary | ICD-10-CM | POA: Diagnosis present

## 2021-08-15 DIAGNOSIS — J189 Pneumonia, unspecified organism: Secondary | ICD-10-CM | POA: Diagnosis present

## 2021-08-15 DIAGNOSIS — Z978 Presence of other specified devices: Secondary | ICD-10-CM

## 2021-08-15 DIAGNOSIS — G928 Other toxic encephalopathy: Secondary | ICD-10-CM | POA: Diagnosis present

## 2021-08-15 DIAGNOSIS — R52 Pain, unspecified: Secondary | ICD-10-CM

## 2021-08-15 DIAGNOSIS — Z452 Encounter for adjustment and management of vascular access device: Secondary | ICD-10-CM

## 2021-08-15 DIAGNOSIS — F17200 Nicotine dependence, unspecified, uncomplicated: Secondary | ICD-10-CM | POA: Diagnosis present

## 2021-08-15 DIAGNOSIS — A419 Sepsis, unspecified organism: Secondary | ICD-10-CM | POA: Diagnosis not present

## 2021-08-15 DIAGNOSIS — E876 Hypokalemia: Secondary | ICD-10-CM | POA: Diagnosis not present

## 2021-08-15 DIAGNOSIS — K838 Other specified diseases of biliary tract: Secondary | ICD-10-CM

## 2021-08-15 DIAGNOSIS — Z789 Other specified health status: Secondary | ICD-10-CM

## 2021-08-15 DIAGNOSIS — F17210 Nicotine dependence, cigarettes, uncomplicated: Secondary | ICD-10-CM | POA: Diagnosis present

## 2021-08-15 DIAGNOSIS — D6959 Other secondary thrombocytopenia: Secondary | ICD-10-CM | POA: Diagnosis present

## 2021-08-15 DIAGNOSIS — Z515 Encounter for palliative care: Secondary | ICD-10-CM

## 2021-08-15 DIAGNOSIS — J984 Other disorders of lung: Secondary | ICD-10-CM

## 2021-08-15 DIAGNOSIS — N179 Acute kidney failure, unspecified: Secondary | ICD-10-CM

## 2021-08-15 DIAGNOSIS — R6520 Severe sepsis without septic shock: Secondary | ICD-10-CM | POA: Diagnosis not present

## 2021-08-15 DIAGNOSIS — I10 Essential (primary) hypertension: Secondary | ICD-10-CM | POA: Diagnosis present

## 2021-08-15 DIAGNOSIS — J9621 Acute and chronic respiratory failure with hypoxia: Secondary | ICD-10-CM | POA: Diagnosis present

## 2021-08-15 DIAGNOSIS — E162 Hypoglycemia, unspecified: Secondary | ICD-10-CM | POA: Diagnosis not present

## 2021-08-15 DIAGNOSIS — R140 Abdominal distension (gaseous): Secondary | ICD-10-CM

## 2021-08-15 DIAGNOSIS — R739 Hyperglycemia, unspecified: Secondary | ICD-10-CM | POA: Diagnosis present

## 2021-08-15 DIAGNOSIS — E878 Other disorders of electrolyte and fluid balance, not elsewhere classified: Secondary | ICD-10-CM | POA: Diagnosis present

## 2021-08-15 DIAGNOSIS — I472 Ventricular tachycardia, unspecified: Secondary | ICD-10-CM | POA: Diagnosis not present

## 2021-08-15 DIAGNOSIS — E875 Hyperkalemia: Secondary | ICD-10-CM | POA: Diagnosis present

## 2021-08-15 DIAGNOSIS — Z95828 Presence of other vascular implants and grafts: Secondary | ICD-10-CM

## 2021-08-15 DIAGNOSIS — K529 Noninfective gastroenteritis and colitis, unspecified: Secondary | ICD-10-CM | POA: Diagnosis present

## 2021-08-15 DIAGNOSIS — R911 Solitary pulmonary nodule: Secondary | ICD-10-CM | POA: Diagnosis present

## 2021-08-15 DIAGNOSIS — E871 Hypo-osmolality and hyponatremia: Secondary | ICD-10-CM | POA: Diagnosis present

## 2021-08-15 DIAGNOSIS — R0902 Hypoxemia: Secondary | ICD-10-CM

## 2021-08-15 DIAGNOSIS — I493 Ventricular premature depolarization: Secondary | ICD-10-CM | POA: Diagnosis not present

## 2021-08-15 DIAGNOSIS — K219 Gastro-esophageal reflux disease without esophagitis: Secondary | ICD-10-CM | POA: Diagnosis present

## 2021-08-15 DIAGNOSIS — F32A Depression, unspecified: Secondary | ICD-10-CM | POA: Diagnosis present

## 2021-08-15 DIAGNOSIS — D6489 Other specified anemias: Secondary | ICD-10-CM | POA: Diagnosis present

## 2021-08-15 DIAGNOSIS — Z6823 Body mass index (BMI) 23.0-23.9, adult: Secondary | ICD-10-CM

## 2021-08-15 DIAGNOSIS — G894 Chronic pain syndrome: Secondary | ICD-10-CM | POA: Diagnosis present

## 2021-08-15 DIAGNOSIS — I480 Paroxysmal atrial fibrillation: Secondary | ICD-10-CM | POA: Diagnosis present

## 2021-08-15 DIAGNOSIS — R627 Adult failure to thrive: Secondary | ICD-10-CM | POA: Diagnosis present

## 2021-08-15 DIAGNOSIS — L8990 Pressure ulcer of unspecified site, unspecified stage: Secondary | ICD-10-CM | POA: Insufficient documentation

## 2021-08-15 DIAGNOSIS — J690 Pneumonitis due to inhalation of food and vomit: Secondary | ICD-10-CM | POA: Diagnosis present

## 2021-08-15 DIAGNOSIS — R339 Retention of urine, unspecified: Secondary | ICD-10-CM | POA: Diagnosis present

## 2021-08-15 DIAGNOSIS — Z79899 Other long term (current) drug therapy: Secondary | ICD-10-CM

## 2021-08-15 DIAGNOSIS — E8721 Acute metabolic acidosis: Secondary | ICD-10-CM | POA: Diagnosis present

## 2021-08-15 DIAGNOSIS — J9600 Acute respiratory failure, unspecified whether with hypoxia or hypercapnia: Secondary | ICD-10-CM

## 2021-08-15 DIAGNOSIS — K5900 Constipation, unspecified: Secondary | ICD-10-CM | POA: Diagnosis present

## 2021-08-15 DIAGNOSIS — I5032 Chronic diastolic (congestive) heart failure: Secondary | ICD-10-CM | POA: Diagnosis present

## 2021-08-15 DIAGNOSIS — G2581 Restless legs syndrome: Secondary | ICD-10-CM | POA: Diagnosis present

## 2021-08-15 DIAGNOSIS — R64 Cachexia: Secondary | ICD-10-CM | POA: Diagnosis present

## 2021-08-15 DIAGNOSIS — J9601 Acute respiratory failure with hypoxia: Secondary | ICD-10-CM | POA: Diagnosis present

## 2021-08-15 DIAGNOSIS — E43 Unspecified severe protein-calorie malnutrition: Secondary | ICD-10-CM | POA: Diagnosis present

## 2021-08-15 DIAGNOSIS — I110 Hypertensive heart disease with heart failure: Secondary | ICD-10-CM | POA: Diagnosis present

## 2021-08-15 DIAGNOSIS — N170 Acute kidney failure with tubular necrosis: Secondary | ICD-10-CM | POA: Diagnosis present

## 2021-08-15 DIAGNOSIS — J441 Chronic obstructive pulmonary disease with (acute) exacerbation: Secondary | ICD-10-CM | POA: Diagnosis present

## 2021-08-15 DIAGNOSIS — Z4659 Encounter for fitting and adjustment of other gastrointestinal appliance and device: Secondary | ICD-10-CM

## 2021-08-15 DIAGNOSIS — E440 Moderate protein-calorie malnutrition: Secondary | ICD-10-CM | POA: Insufficient documentation

## 2021-08-15 DIAGNOSIS — E7800 Pure hypercholesterolemia, unspecified: Secondary | ICD-10-CM | POA: Diagnosis present

## 2021-08-15 DIAGNOSIS — Z66 Do not resuscitate: Secondary | ICD-10-CM | POA: Diagnosis not present

## 2021-08-15 DIAGNOSIS — Z20822 Contact with and (suspected) exposure to covid-19: Secondary | ICD-10-CM | POA: Diagnosis present

## 2021-08-15 LAB — COMPREHENSIVE METABOLIC PANEL
ALT: 68 U/L — ABNORMAL HIGH (ref 0–44)
AST: 136 U/L — ABNORMAL HIGH (ref 15–41)
Albumin: 3 g/dL — ABNORMAL LOW (ref 3.5–5.0)
Alkaline Phosphatase: 111 U/L (ref 38–126)
Anion gap: 20 — ABNORMAL HIGH (ref 5–15)
BUN: 89 mg/dL — ABNORMAL HIGH (ref 8–23)
CO2: 16 mmol/L — ABNORMAL LOW (ref 22–32)
Calcium: 7 mg/dL — ABNORMAL LOW (ref 8.9–10.3)
Chloride: 84 mmol/L — ABNORMAL LOW (ref 98–111)
Creatinine, Ser: 3.94 mg/dL — ABNORMAL HIGH (ref 0.44–1.00)
GFR, Estimated: 11 mL/min — ABNORMAL LOW (ref >60)
Glucose, Bld: 74 mg/dL (ref 70–99)
Potassium: 5.1 mmol/L (ref 3.5–5.1)
Sodium: 120 mmol/L — ABNORMAL LOW (ref 135–145)
Total Bilirubin: 0.8 mg/dL (ref 0.3–1.2)
Total Protein: 6.3 g/dL — ABNORMAL LOW (ref 6.5–8.1)

## 2021-08-15 LAB — LACTIC ACID, PLASMA
Lactic Acid, Venous: 2.4 mmol/L (ref 0.5–1.9)
Lactic Acid, Venous: 2.3 mmol/L (ref 0.5–1.9)

## 2021-08-15 LAB — CBC
HCT: 32.8 % — ABNORMAL LOW (ref 36.0–46.0)
Hemoglobin: 11.3 g/dL — ABNORMAL LOW (ref 12.0–15.0)
MCH: 28.8 pg (ref 26.0–34.0)
MCHC: 34.5 g/dL (ref 30.0–36.0)
MCV: 83.7 fL (ref 80.0–100.0)
Platelets: 229 10*3/uL (ref 150–400)
RBC: 3.92 MIL/uL (ref 3.87–5.11)
RDW: 18.4 % — ABNORMAL HIGH (ref 11.5–15.5)
WBC: 7.1 10*3/uL (ref 4.0–10.5)
nRBC: 0 % (ref 0.0–0.2)

## 2021-08-15 LAB — RESP PANEL BY RT-PCR (FLU A&B, COVID) ARPGX2
Influenza A by PCR: NEGATIVE
Influenza B by PCR: NEGATIVE
SARS Coronavirus 2 by RT PCR: NEGATIVE

## 2021-08-15 LAB — TROPONIN I (HIGH SENSITIVITY)
Troponin I (High Sensitivity): 49 ng/L — ABNORMAL HIGH (ref <18)
Troponin I (High Sensitivity): 60 ng/L — ABNORMAL HIGH (ref <18)

## 2021-08-15 LAB — APTT: aPTT: 33 seconds (ref 24–36)

## 2021-08-15 LAB — PROTIME-INR
INR: 1.2 (ref 0.8–1.2)
Prothrombin Time: 14.7 seconds (ref 11.4–15.2)

## 2021-08-15 LAB — PROCALCITONIN: Procalcitonin: 132.64 ng/mL

## 2021-08-15 MED ORDER — IPRATROPIUM-ALBUTEROL 0.5-2.5 (3) MG/3ML IN SOLN
3.0000 mL | Freq: Three times a day (TID) | RESPIRATORY_TRACT | Status: DC
  Administered 2021-08-15 – 2021-08-16 (×3): 3 mL via RESPIRATORY_TRACT
  Filled 2021-08-15 (×3): qty 3

## 2021-08-15 MED ORDER — DILTIAZEM HCL 60 MG PO TABS: 120.0000 mg | ORAL_TABLET | Freq: Every day | ORAL | Status: DC

## 2021-08-15 MED ORDER — SODIUM CHLORIDE 0.9 % IV BOLUS (SEPSIS)
1000.0000 mL | Freq: Once | INTRAVENOUS | Status: AC
  Administered 2021-08-15: 1000 mL via INTRAVENOUS

## 2021-08-15 MED ORDER — ONDANSETRON HCL 4 MG/2ML IJ SOLN
4.0000 mg | Freq: Four times a day (QID) | INTRAMUSCULAR | Status: DC | PRN
  Administered 2021-08-18: 4 mg via INTRAVENOUS
  Filled 2021-08-15: qty 2

## 2021-08-15 MED ORDER — LATANOPROST 0.005 % OP SOLN
1.0000 [drp] | Freq: Every day | OPHTHALMIC | Status: DC
  Administered 2021-08-16 – 2021-08-25 (×10): 1 [drp] via OPHTHALMIC
  Filled 2021-08-15: qty 2.5

## 2021-08-15 MED ORDER — SODIUM CHLORIDE 0.9 % IV SOLN
2.0000 g | Freq: Once | INTRAVENOUS | Status: AC
  Administered 2021-08-15: 2 g via INTRAVENOUS
  Filled 2021-08-15: qty 2

## 2021-08-15 MED ORDER — METRONIDAZOLE 500 MG/100ML IV SOLN: 500.0000 mg | Freq: Once | INTRAVENOUS | Status: DC

## 2021-08-15 MED ORDER — ATORVASTATIN CALCIUM 20 MG PO TABS
40.0000 mg | ORAL_TABLET | Freq: Every day | ORAL | Status: DC
  Administered 2021-08-16 – 2021-08-18 (×3): 40 mg via ORAL
  Filled 2021-08-15 (×3): qty 2

## 2021-08-15 MED ORDER — IPRATROPIUM-ALBUTEROL 0.5-2.5 (3) MG/3ML IN SOLN
3.0000 mL | Freq: Once | RESPIRATORY_TRACT | Status: AC
  Administered 2021-08-15: 3 mL via RESPIRATORY_TRACT
  Filled 2021-08-15: qty 3

## 2021-08-15 MED ORDER — AZITHROMYCIN 500 MG IV SOLR
500.0000 mg | INTRAVENOUS | Status: DC
Start: 2021-08-15 — End: 2021-08-16
  Administered 2021-08-16: 500 mg via INTRAVENOUS
  Filled 2021-08-15 (×2): qty 500

## 2021-08-15 MED ORDER — ROPINIROLE HCL 1 MG PO TABS
2.0000 mg | ORAL_TABLET | Freq: Every day | ORAL | Status: DC
  Administered 2021-08-15: 2 mg via ORAL
  Filled 2021-08-15 (×3): qty 2

## 2021-08-15 MED ORDER — NICOTINE 21 MG/24HR TD PT24
21.0000 mg | MEDICATED_PATCH | Freq: Every day | TRANSDERMAL | Status: DC | PRN
Start: 2021-08-15 — End: 2021-08-21

## 2021-08-15 MED ORDER — PANTOPRAZOLE SODIUM 40 MG PO TBEC
80.0000 mg | DELAYED_RELEASE_TABLET | Freq: Every day | ORAL | Status: DC
  Administered 2021-08-16: 80 mg via ORAL
  Filled 2021-08-15: qty 2

## 2021-08-15 MED ORDER — ACETAMINOPHEN 325 MG PO TABS
650.0000 mg | ORAL_TABLET | Freq: Four times a day (QID) | ORAL | Status: AC | PRN
  Administered 2021-08-16: 650 mg via ORAL
  Filled 2021-08-15: qty 2

## 2021-08-15 MED ORDER — POLYETHYLENE GLYCOL 3350 17 G PO PACK
17.0000 g | PACK | Freq: Two times a day (BID) | ORAL | Status: DC
  Administered 2021-08-15 – 2021-08-18 (×4): 17 g via ORAL
  Filled 2021-08-15 (×4): qty 1

## 2021-08-15 MED ORDER — LABETALOL HCL 5 MG/ML IV SOLN: 5.0000 mg | INTRAVENOUS | Status: DC | PRN

## 2021-08-15 MED ORDER — VANCOMYCIN HCL IN DEXTROSE 1-5 GM/200ML-% IV SOLN
1000.0000 mg | Freq: Once | INTRAVENOUS | Status: AC
  Administered 2021-08-15: 1000 mg via INTRAVENOUS
  Filled 2021-08-15: qty 200

## 2021-08-15 MED ORDER — UMECLIDINIUM BROMIDE 62.5 MCG/ACT IN AEPB
1.0000 | INHALATION_SPRAY | Freq: Every day | RESPIRATORY_TRACT | Status: DC
  Administered 2021-08-16: 1 via RESPIRATORY_TRACT
  Filled 2021-08-15: qty 7

## 2021-08-15 MED ORDER — LACTATED RINGERS IV SOLN: INTRAVENOUS | Status: DC

## 2021-08-15 MED ORDER — MIRTAZAPINE 15 MG PO TABS
7.5000 mg | ORAL_TABLET | Freq: Every day | ORAL | Status: DC
  Administered 2021-08-15: 7.5 mg via ORAL
  Filled 2021-08-15: qty 1

## 2021-08-15 MED ORDER — SODIUM CHLORIDE 0.9 % IV SOLN: 2.0000 g | Freq: Once | INTRAVENOUS | Status: DC

## 2021-08-15 MED ORDER — IPRATROPIUM-ALBUTEROL 0.5-2.5 (3) MG/3ML IN SOLN: 3.0000 mL | Freq: Three times a day (TID) | RESPIRATORY_TRACT | Status: DC

## 2021-08-15 MED ORDER — MORPHINE SULFATE (PF) 2 MG/ML IV SOLN: 2.0000 mg | INTRAVENOUS | Status: DC | PRN

## 2021-08-15 MED ORDER — ALBUTEROL SULFATE (2.5 MG/3ML) 0.083% IN NEBU
2.5000 mg | INHALATION_SOLUTION | Freq: Four times a day (QID) | RESPIRATORY_TRACT | Status: DC | PRN
  Administered 2021-08-22: 2.5 mg via RESPIRATORY_TRACT
  Filled 2021-08-15: qty 3

## 2021-08-15 MED ORDER — ACETAMINOPHEN 650 MG RE SUPP: 650.0000 mg | Freq: Four times a day (QID) | RECTAL | Status: AC | PRN

## 2021-08-15 MED ORDER — TRAMADOL HCL 50 MG PO TABS
50.0000 mg | ORAL_TABLET | Freq: Two times a day (BID) | ORAL | Status: DC | PRN
  Administered 2021-08-16: 50 mg via ORAL
  Filled 2021-08-15: qty 1

## 2021-08-15 MED ORDER — METHYLPREDNISOLONE SODIUM SUCC 125 MG IJ SOLR
125.0000 mg | Freq: Once | INTRAMUSCULAR | Status: AC
  Administered 2021-08-15: 125 mg via INTRAVENOUS
  Filled 2021-08-15: qty 2

## 2021-08-15 MED ORDER — ARFORMOTEROL TARTRATE 15 MCG/2ML IN NEBU
15.0000 ug | INHALATION_SOLUTION | Freq: Two times a day (BID) | RESPIRATORY_TRACT | Status: DC
  Administered 2021-08-16 – 2021-08-23 (×15): 15 ug via RESPIRATORY_TRACT
  Filled 2021-08-15 (×16): qty 2

## 2021-08-15 MED ORDER — ONDANSETRON HCL 4 MG PO TABS: 4.0000 mg | ORAL_TABLET | Freq: Four times a day (QID) | ORAL | Status: DC | PRN

## 2021-08-15 MED ORDER — HEPARIN SODIUM (PORCINE) 5000 UNIT/ML IJ SOLN
5000.0000 [IU] | Freq: Three times a day (TID) | INTRAMUSCULAR | Status: DC
  Administered 2021-08-15 – 2021-08-19 (×12): 5000 [IU] via SUBCUTANEOUS
  Filled 2021-08-15 (×12): qty 1

## 2021-08-15 MED ORDER — SODIUM CHLORIDE 0.9 % IV SOLN: INTRAVENOUS | Status: AC

## 2021-08-15 NOTE — Progress Notes (Signed)
CODE SEPSIS - PHARMACY COMMUNICATION  **Broad Spectrum Antibiotics should be administered within 1 hour of Sepsis diagnosis**  Time Code Sepsis Called/Page Received: 1423  Antibiotics Ordered: 1430  Time of 1st antibiotic administration: 1601  Additional action taken by pharmacy: Message RN after Azt > CFP and ensure stock available of all meds. RN confirmed and said pt OTF to CT scan and will administer ASAP on return.  If necessary, Name of Provider/Nurse Contacted: Jason Fila RN (see above for details)  Martyn Malay, PharmD, [facility] Clinical Pharmacist 08/01/2021  2:48 PM

## 2021-08-15 NOTE — ED Notes (Signed)
Dr. Fuller Plan aware of Lactic of 2.4

## 2021-08-15 NOTE — ED Notes (Signed)
Dr. Fuller Plan aware of Lactic of 2.3.

## 2021-08-15 NOTE — Progress Notes (Signed)
Elink following for sepsis protocol. 

## 2021-08-15 NOTE — ED Notes (Signed)
Patient taken to CT scan.

## 2021-08-15 NOTE — ED Notes (Signed)
Patient was 100% on NRB. Patient switched to 6L on [place], maintaing sats at 95%. Dr. Lenard Lance aware.

## 2021-08-15 NOTE — ED Notes (Signed)
Dinner meal is at bedside.

## 2021-08-15 NOTE — Consult Note (Signed)
PHARMACY -  BRIEF ANTIBIOTIC NOTE   Pharmacy has received consult(s) for Vancomycin and Aztreonam from an ED provider.  The patient's profile has been reviewed for ht/wt/allergies/indication/available labs.    One time order(s) placed for: Vancomycin 1g x1 Aztreonam was interchanged with Cefepime 2g x1 per consult parameters (see below) Pt has tolerated Ancef, Ceftriaxone, Zosyn, Cephalexin, and Augmentin in the recent past (2021-2022) Reported reaction: Itching without rash per patient.  Unable to say when it happened, other than "a long time ago" Pt also receiving Metronidazole 500mg  IV x1  Further antibiotics/pharmacy consults should be ordered by admitting physician if indicated.                       Thank you, , PharmD, [facility] Clinical Pharmacist 07/27/2021  2:46 PM

## 2021-08-15 NOTE — ED Provider Notes (Addendum)
[facility] Emergency Department Provider Note  Time seen: 1:14 PM  I have reviewed the triage vital signs and the nursing notes.   HISTORY  Chief Complaint Respiratory Distress and Abdominal Pain   HPI Suzanne Avila is a 75 y.o. female with a past medical history of COPD, hypertension, hyperlipidemia, presents to the emergency department for acute difficulty breathing.  According EMS patient is coming from home for respiratory distress, patient recently recovering from a right hip fracture, recently discharged from rehab.  Patient states she has not had a bowel movement approximately 2 days and feels like her abdomen is somewhat distended.  Patient satting 74% on room air with EMS, no baseline O2 requirement per patient.  Patient states occasional cough.  Denies any fever.  Denies any vomiting or diarrhea.  Last bowel movement 2 days ago.  States she feels constipated/distended but denies any abdominal pain.   Past Medical History:  Diagnosis Date   Closed left arm fracture    COPD (chronic obstructive pulmonary disease) (HCC)    Dehydration 09/15/2020   Glaucoma    Hyperlipidemia    Hypertension    Lung mass     Patient Active Problem List   Diagnosis Date Noted   Femur fracture, right (HCC) 07/21/2021   Fall at home, initial encounter    AKI (acute kidney injury) (HCC)    Depression    Abnormal CT scan, lumbar spine 07/06/2021   Lumbar foraminal stenosis (Bilateral: L2-3 and L3-4) (L>R) 07/06/2021   Lumbosacral lateral recess stenosis (Bilateral: L3-4) (L>R) 07/06/2021   Chronic hip pain (Bilateral) (L>R) 06/22/2021   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 06/22/2021   Lumbosacral radiculopathy at L2 (Left) 06/01/2021   Impaired ambulation 06/01/2021   Underweight 05/14/2021   Chronic use of opiate for therapeutic purpose 01/26/2021   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   Avascular  necrosis of femoral head (Left) (HCC) 11/02/2020   Cavitary lesion of lung 09/15/2020   AF (paroxysmal atrial fibrillation) (HCC) 09/15/2020   Failure to thrive in adult 09/15/2020   Severe protein-calorie malnutrition [name]: less than 60% of standard weight) (HCC) 09/15/2020   Dysphagia 09/15/2020   Hypotension 09/15/2020   Abscess of right lung with pneumonia (HCC) 09/14/2020   Chronic low back pain (Left) w/o sciatica 09/10/2020   Pharmacologic therapy 05/05/2020   Chronic diarrhea 05/04/2020   DDD (degenerative disc disease), lumbosacral 11/19/2019   Therapeutic opioid-induced constipation (OIC) 08/01/2019   Chronic sacroiliac joint pain (Left) 05/01/2019   Chronic groin pain (Left) 05/01/2019   Spondylosis without myelopathy or radiculopathy, lumbosacral region 05/01/2019   Other specified dorsopathies, sacral and sacrococcygeal region 05/01/2019   Renal artery stenosis (HCC) 12/27/2017   Pain in limb 12/22/2017   Tobacco use disorder 12/22/2017   Renal atrophy, right 12/22/2017   Thoracic radiculitis 01/31/2017   Chronic lower extremity pain (1ry area of Pain) (Bilateral) (L>R) 04/25/2016   Chronic upper extremity pain (3ry area of Pain) (Bilateral) (L>R) 04/25/2016   Disturbance of skin sensation 04/20/2016   Neurogenic pain 04/20/2016   Vitamin D deficiency 11/11/2015   Edema 09/02/2015   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   Long term current use of opiate analgesic 08/14/2015   Long term prescription opiate use 08/14/2015   Uncomplicated opioid dependence (HCC) 08/14/2015   Opiate use (30 MME/Day) 08/14/2015   Chronic low back pain (2ry area of Pain) (Bilateral) (L>R) w/ sciatica (Bilateral) 08/14/2015  Chronic pain syndrome 08/14/2015   Alcoholic (HCC) 08/14/2015   B12 deficiency 08/14/2015   Narrowing of intervertebral disc space 08/14/2015   History of metabolic disorder 08/14/2015   Fungal infection of toenail 08/14/2015   Osteopenia 08/14/2015    Compulsive tobacco user syndrome 08/14/2015   Osteoarthritis of hip (Left) 08/14/2015   Chronic hip pain (Left) 08/14/2015   Chronic lumbar radicular pain (L5 dermatome) (Bilateral) (L>R) 08/14/2015   Lumbar facet syndrome (Bilateral) (L>R) 08/14/2015   Lumbar spondylosis 08/14/2015   Lumbar Postlaminectomy syndrome (L4-5) 08/14/2015   Fibromyalgia 08/14/2015   COPD (chronic obstructive pulmonary disease) (HCC) 04/21/2015   Allergic rhinitis 02/10/2010   Atrial paroxysmal tachycardia (HCC) 07/06/2009   CAFL (chronic airflow limitation) (HCC) 07/06/2009   Acid reflux 07/06/2009   Glaucoma 07/06/2009   Essential hypertension 07/06/2009   Hypercholesterolemia without hypertriglyceridemia 07/06/2009   Absence of bladder continence 07/06/2009    Past Surgical History:  Procedure Laterality Date   BACK SURGERY     CARPAL TUNNEL RELEASE Left    EYE SURGERY     FEMUR IM NAIL Right 07/21/2021   Procedure: INTRAMEDULLARY (IM) RETROGRADE FEMORAL NAILING;  Surgeon: Kennedy Bucker, MD;  Location: [facility] ORS;  Service: Orthopedics;  Laterality: Right;    Prior to Admission medications   Medication Sig Start Date End Date Taking? Authorizing Provider  albuterol (PROVENTIL) (2.5 MG/3ML) 0.083% nebulizer solution Take 3 mLs (2.5 mg total) by nebulization every 6 (six) hours as needed for wheezing or shortness of breath. 10/21/20   Margaretann Loveless, PA-C  albuterol (VENTOLIN HFA) 108 (90 Base) MCG/ACT inhaler Inhale 2 puffs into the lungs every 6 (six) hours as needed for wheezing or shortness of breath. 11/18/20   Salena Saner, MD  atorvastatin (LIPITOR) 40 MG tablet Take 1 tablet (40 mg total) by mouth daily. 04/03/21   Malva Limes, MD  diltiazem (CARDIZEM) 120 MG tablet Take 1 tablet (120 mg total) by mouth daily. 04/03/21   Malva Limes, MD  docusate sodium (COLACE) 100 MG capsule Take 1 capsule (100 mg total) by mouth 2 (two) times daily. 07/26/21   Lurene Shadow, MD  dorzolamide  (TRUSOPT) 2 % ophthalmic solution Place 1 drop into both eyes 2 (two) times daily. 04/01/21   Maple Hudson., MD  enoxaparin (LOVENOX) 40 MG/0.4ML injection Inject 0.4 mLs (40 mg total) into the skin daily for 14 days. 07/23/21 08/06/21  Evon Slack, PA-C  HYDROcodone-acetaminophen (NORCO) 7.5-325 MG tablet Take 1 tablet by mouth every 6 (six) hours as needed for severe pain. Must last 30 days 08/02/21 09/01/21  Delano Metz, MD  HYDROcodone-acetaminophen (NORCO) 7.5-325 MG tablet Take 1 tablet by mouth every 6 (six) hours as needed for severe pain. Must last 30 days 09/01/21 10/01/21  Delano Metz, MD  latanoprost (XALATAN) 0.005 % ophthalmic solution Place 1 drop into both eyes at bedtime. 04/01/21   Maple Hudson., MD  magnesium gluconate (MAGONATE) 500 MG tablet Take 500 mg by mouth daily.    [provider]  mirtazapine (REMERON) 7.5 MG tablet Take 1 tablet (7.5 mg total) by mouth at bedtime. 04/03/21   Malva Limes, MD  Multiple Vitamin (MULTI-VITAMIN) tablet Take 1 tablet by mouth daily.     [provider]  omeprazole (PRILOSEC) 40 MG capsule Take 1 capsule (40 mg total) by mouth daily. 05/04/21   Erasmo Downer, MD  polyethylene glycol (MIRALAX / GLYCOLAX) 17 g packet Take 17 g by mouth daily  as needed for mild constipation. 07/26/21   Lurene Shadow, MD  pregabalin (LYRICA) 150 MG capsule Take 1 capsule (150 mg total) by mouth every 8 (eight) hours. 04/03/21   Malva Limes, MD  ramipril (ALTACE) 2.5 MG capsule Take 1 capsule (2.5 mg total) by mouth daily. 10/21/20 10/21/21  Margaretann Loveless, PA-C  rOPINIRole (REQUIP) 2 MG tablet Take 1 tablet (2 mg total) by mouth at bedtime. 07/26/21   Lurene Shadow, MD  spironolactone (ALDACTONE) 25 MG tablet Take 1 tablet (25 mg total) by mouth daily. 04/03/21   Malva Limes, MD  Tiotropium Bromide-Olodaterol (STIOLTO RESPIMAT) 2.5-2.5 MCG/ACT AERS Inhale 2 puffs into the lungs daily. 07/07/21    Salena Saner, MD  tizanidine (ZANAFLEX) 2 MG capsule Take 1 capsule (2 mg total) by mouth 3 (three) times daily as needed for muscle spasms. 05/04/21   Erasmo Downer, MD  torsemide (DEMADEX) 20 MG tablet TAKE 2 TABLETS TWICE A DAY 06/18/21   Bacigalupo, Marzella Schlein, MD  traMADol (ULTRAM) 50 MG tablet Take 1 tablet (50 mg total) by mouth every 6 (six) hours as needed. 07/23/21   Evon Slack, PA-C  vitamin B-12 (CYANOCOBALAMIN) 500 MCG tablet Take 1 tablet (500 mcg total) by mouth daily. 10/21/20   Margaretann Loveless, PA-C    Allergies  Allergen Reactions   Codeine Itching   Cyclobenzaprine Itching   Penicillins     Itching without rash per patient.  Unable to say when it happened, other than "a long time ago"    Family History  Problem Relation Age of Onset   Cancer Mother    Cancer Father     Social History Social History   Tobacco Use   Smoking status: Every Day    Packs/day: 1.50    Years: 50.00    Pack years: 75.00    Types: Cigarettes   Smokeless tobacco: Never   Tobacco comments:    1PPD 07/07/2021  Vaping Use   Vaping Use: Former  Substance Use Topics   Alcohol use: Yes    Alcohol/week: 35.0 standard drinks    Types: 35 Cans of beer per week    Comment: sytates she quit 09/23/2020   Drug use: No    Review of Systems Constitutional: Negative for fever. Cardiovascular: Negative for chest pain. Respiratory: Positive for shortness of breath and cough. Gastrointestinal: Positive for abdominal distention and constipation x2 days.  Negative for vomiting. Musculoskeletal: Continued right leg pain but no acute change. Skin: Bruising noted to the right lower extremity which patient states has been present for some time. Neurological: Negative for headache All other ROS negative  ____________________________________________   PHYSICAL EXAM:  VITAL SIGNS: ED Triage Vitals  Enc Vitals Group     BP 07/27/2021 1252 (!) 135/119     Pulse Rate 08/19/2021  1252 100     Resp 08/16/2021 1252 (!) 26     Temp 08/02/2021 1252 (!) 97.3 F (36.3 C)     Temp Source 08/14/2021 1252 Axillary     SpO2 08/10/2021 1243 94 %     Weight 07/24/2021 1250 89 lb 1.1 oz (40.4 kg)     Height --      Head Circumference --      Peak Flow --      Pain Score 07/25/2021 1249 7     Pain Loc --      Pain Edu? --      Excl. in GC? --  Constitutional: Alert and oriented.  Frail/cachectic in appearance. Eyes: Normal exam ENT      Head: Normocephalic and atraumatic.      Mouth/Throat: Mucous membranes are moist. Cardiovascular: Normal rate, regular rhythm around 100 bpm.  Respiratory: Mild tachypnea with diminished breath sounds bilaterally. Gastrointestinal: Soft and nontender. No distention.   Musculoskeletal: Nontender with normal range of motion in all extremities Neurologic:  Normal speech and language. No gross focal neurologic deficits Skin:  Skin is warm, dry and intact.  Psychiatric: Mood and affect are normal.   ____________________________________________    EKG  EKG viewed and interpreted by myself shows normal sinus rhythm at 93 bpm with a narrow QRS, normal axis, normal intervals, nonspecific ST changes.  ____________________________________________    RADIOLOGY  Chest x-ray suspicious for multifocal pneumonia.  ____________________________________________   INITIAL IMPRESSION / ASSESSMENT AND PLAN / ED COURSE  Pertinent labs & imaging results that were available during my care of the patient were reviewed by me and considered in my medical decision making (see chart for details).   Patient presents emergency department for shortness of breath.  Patient found to be satting 74% on room air with no baseline O2 requirement.  Patient brought into the emergency department on nonrebreather 15 L satting 100%.  Patient does have diminished lung sounds bilaterally.  But no obvious wheeze rales or rhonchi.  Patient has a history of COPD.  Patient has mild  abdominal distention although not overly tender.  Given the patient's recent right lower extremity surgery ideally I would like to obtain a CTA of the chest to rule out pulmonary embolism as well as of the abdomen to rule out bowel obstruction or intra-abdominal pathology.  We will check labs to ensure patient's renal function can tolerate IV dye.  We will dose duo nebs Solu-Medrol and continue to closely monitor while awaiting results.  Patient satting 100% on a nonrebreather we will attempt to titrate to 6 L nasal cannula.  Patient's labs have resulted showing acute renal failure, sodium of 120.  Patient receiving IV fluids.  Troponin slightly elevated at 49.  Patient will require admission to the hospital once her work-up is completed.  CT scan of the chest abdomen pelvis without contrast given her kidney function is pending.  I have ordered antibiotics.  Chest x-ray.  Show multifocal pneumonia.  Given the patient's elevated heart rate respiratory rate hypoxia with multifocal pneumonia on chest x-ray we will cover for sepsis including broad-spectrum antibiotics, blood cultures, COVID test.  Lab work is pending, CTA is pending to rule out pulmonary embolism.  Ultimately patient will require admission to the hospital service once her work-up is been completed.  NASEEM ADLER was evaluated in Emergency Department on 07/24/2021 for the symptoms described in the history of present illness. She was evaluated in the context of the global COVID-19 pandemic, which necessitated consideration that the patient might be at risk for infection with the SARS-CoV-2 virus that causes COVID-19. Institutional protocols and algorithms that pertain to the evaluation of patients at risk for COVID-19 are in a state of rapid change based on information released by regulatory bodies including the CDC and federal and state organizations. These policies and algorithms were followed during the patient's care in the ED.  CRITICAL  CARE Performed by: Minna Antis   Total critical care time: 30 minutes  Critical care time was exclusive of separately billable procedures and treating other patients.  Critical care was necessary to treat or prevent imminent or  life-threatening deterioration.  Critical care was time spent personally by me on the following activities: development of treatment plan with patient and/or surrogate as well as nursing, discussions with consultants, evaluation of patient's response to treatment, examination of patient, obtaining history from patient or surrogate, ordering and performing treatments and interventions, ordering and review of laboratory studies, ordering and review of radiographic studies, pulse oximetry and re-evaluation of patient's condition.  ____________________________________________   FINAL CLINICAL IMPRESSION(S) / ED DIAGNOSES  Dyspnea Hypoxia Abdominal pain   Minna Antis, MD 08/21/2021 1425    Minna Antis, MD 07/29/2021 1510

## 2021-08-15 NOTE — H&P (Addendum)
History and Physical   Suzanne Avila UTM:[id number] DOB: 1945-12-16 DOA: 08-18-2021  PCP: Suzanne Downer, MD  Outpatient Specialists: Dr. Laban Avila, pain management Patient coming from: Shortness of breath  I have personally briefly reviewed patient's old medical records in [facility] EMR.  Chief Concern: Shortness of breath  HPI: Suzanne Avila is a 75 y.o. female with medical history significant for chronic pain syndrome, hypertension, hyperlipidemia, paroxysmal atrial fibrillation, heart failure preserved ejection fraction, GERD, who presents emergency department for chief concerns of shortness of breath.  At bedside, she is able to tell me her name, age, current calendar year, and current location.   She reports that she was at the facility and she was getting up to use the bathroom, when she developed the shortness of breath started 08/14/21.   She came back from facility about 1.5 weeks ago. She endorses both diarrhea and constipation. She denies fever, known sick contacts, nausea, vomiting, chest pain, dysuria, syncope. She endorses generalized abdominal pain.   She reports normal bowel movement on 08/14/21.   Social history: She lives at home and her son and husband lives with her. She is a current tobacco user, she smokes about 1/2 ppd. She couldn't tell me how old she was when she started. She denies etoh and recreational drug use. She is retired and formerly worked 'a little bit of everything'.   Vaccination history: She is vaccinated for covid 19, 2 doses total.   ROS: Constitutional: no weight change, no fever ENT/Mouth: no sore throat, no rhinorrhea Eyes: no eye pain, no vision changes Cardiovascular: no chest pain, + dyspnea,  no edema, no palpitations Respiratory: no cough, no sputum, + wheezing Gastrointestinal: no nausea, no vomiting, no diarrhea, no constipation Genitourinary: no urinary incontinence, no dysuria, no hematuria Musculoskeletal: no  arthralgias, no myalgias Skin: no skin lesions, no pruritus, Neuro: + weakness, no loss of consciousness, no syncope Psych: no anxiety, no depression, + decrease appetite Heme/Lymph: no bruising, no bleeding  ED Course: Discussed with emergency medicine provider, patient requiring hospitalization for chief concerns of hypoxia.  Vitals in the emergency department was remarkable for temperature of 97.3, respiration rate was 26 and improved to 20, heart rate of 100, initial blood pressure 135/119, increased to 164/125.  Labs in the emergency department was remarkable for sodium 120, potassium 5.1, chloride of 86, bicarb 16, BUN of 89, serum creatinine of 3.94, GFR of 11, nonfasting blood glucose 74, WBC 7.1, hemoglobin 11.3, platelets of 229.  High sensitive troponin was elevated at 49.  COVID/influenza A/influenza B are pending.  Assessment/Plan  Principal Problem:   Acute hypoxemic respiratory failure (HCC) Active Problems:   Chronic pain syndrome   Acid reflux   Essential hypertension   Hypercholesterolemia without hypertriglyceridemia   Tobacco use disorder   Cavitary lesion of lung   AF (paroxysmal atrial fibrillation) (HCC)   Femur fracture, right (HCC)   AKI (acute kidney injury) (HCC)   Depression   # Severe sepsis with organ dysfunction-elevated respiration rate, heart rate, increased lactic acid, source of pneumonia, organ involvement is pulmonary and/or renal # Acute hypoxic respiratory failure-etiology work-up in progress - Presumed multifocal pneumonia, present on admission - Check procalcitonin for baseline, repeat procalcitonin for the next few days to assess for antibiotic - Status post cefepime and vancomycin per EDP - Add azithromycin for atypical coverage, I-S, flutter valve - Continue oxygen supplementation - Patient is maintaining appropriate MAP, no further bolus indicated at this time - Blood cultures x2 -  Admit to MedSurg, observation, telemetry  # Chronic  respiratory failure-patient was prescribed oxygen supplementation nightly - Patient states that she does not it  # Acute kidney injury-suspect secondary to dehydration - Status post sodium chloride 1 L bolus per EDP - Added sodium chloride 100 mL/h, 10 hours ordered - Repeat BMP in the a.m.  # Hyponatremia and hypochloremia-suspect secondary to dehydration, treat as above  # Central lobar nodules - Per CT read, recommend patient to have repeat CT scan of the chest in 18 to 24 months - Discussed with patient at bedside, she endorses compliance and understanding - Also discussed these lobar nodule finding with spouse and son over the phone on speaker and recommended that she get a repeat imaging in 18 to 24 months, they endorsed understanding and compliance  # Chronic pain syndrome-resumed home tramadol 50 mg p.o. every 6 hours, for moderate pain  # Restless leg syndrome-resumed home Requip 2 mg nightly # COPD-resumed home inhaler, duo nebs, 3 times daily ordered # Tobacco use-nicotine patch daily, for nicotine craving # Hyperlipidemia-atorvastatin 40 mg nightly # Paroxysmal atrial fibrillation-diltiazem 120 mg p.o. daily # GERD-PPI # COPD-resumed home maintenance treatments  Chart reviewed.   Hospitalization from 07/21/2021 to 07/26/2021: Patient was admitted for fall and pain secondary to right femur fracture.  Patient was discharged to SNF. - Patient was also treated for acute kidney injury and hyperkalemia.  Patient was treated with analgesics.  She also had mild hyponatremia.  PT, OT were consulted.  DVT prophylaxis: Heparin 5000 units subcutaneous every 8 hours Code Status: Full code Diet: Heart healthy Family Communication: Updated Suzanne Avila, her husband and her son over the phone on speaker Disposition Plan: Pending clinical course Consults called: None at this time Admission status: MedSurg, observation, telemetry  Past Medical History:  Diagnosis Date   Closed left  arm fracture    COPD (chronic obstructive pulmonary disease) (HCC)    Dehydration 09/15/2020   Glaucoma    Hyperlipidemia    Hypertension    Lung mass    Past Surgical History:  Procedure Laterality Date   BACK SURGERY     CARPAL TUNNEL RELEASE Left    EYE SURGERY     FEMUR IM NAIL Right 07/21/2021   Procedure: INTRAMEDULLARY (IM) RETROGRADE FEMORAL NAILING;  Surgeon: Kennedy Bucker, MD;  Location: [facility] ORS;  Service: Orthopedics;  Laterality: Right;   Social History:  reports that she has been smoking cigarettes. She has a 75.00 pack-year smoking history. She has never used smokeless tobacco. She reports current alcohol use of about 35.0 standard drinks per week. She reports that she does not use drugs.  Allergies  Allergen Reactions   Codeine Itching   Cyclobenzaprine Itching   Penicillins     Itching without rash per patient.  Unable to say when it happened, other than "a long time ago"   Family History  Problem Relation Age of Onset   Cancer Mother    Cancer Father    Family history: Family history reviewed and not pertinent  Prior to Admission medications   Medication Sig Start Date End Date Taking? Authorizing Provider  albuterol (PROVENTIL) (2.5 MG/3ML) 0.083% nebulizer solution Take 3 mLs (2.5 mg total) by nebulization every 6 (six) hours as needed for wheezing or shortness of breath. 10/21/20  Yes Margaretann Loveless, PA-C  albuterol (VENTOLIN HFA) 108 (90 Base) MCG/ACT inhaler Inhale 2 puffs into the lungs every 6 (six) hours as needed for wheezing or shortness of breath. 11/18/20  Yes Salena Saner, MD  atorvastatin (LIPITOR) 40 MG tablet Take 1 tablet (40 mg total) by mouth daily. 04/03/21  Yes Malva Limes, MD  diltiazem (CARDIZEM) 120 MG tablet Take 1 tablet (120 mg total) by mouth daily. 04/03/21  Yes Malva Limes, MD  docusate sodium (COLACE) 100 MG capsule Take 1 capsule (100 mg total) by mouth 2 (two) times daily. 07/26/21  Yes Lurene Shadow, MD   dorzolamide (TRUSOPT) 2 % ophthalmic solution Place 1 drop into both eyes 2 (two) times daily. 04/01/21  Yes Maple Hudson., MD  latanoprost (XALATAN) 0.005 % ophthalmic solution Place 1 drop into both eyes at bedtime. 04/01/21  Yes Maple Hudson., MD  magnesium gluconate (MAGONATE) 500 MG tablet Take 500 mg by mouth daily.   Yes [provider]  mirtazapine (REMERON) 7.5 MG tablet Take 1 tablet (7.5 mg total) by mouth at bedtime. 04/03/21  Yes Malva Limes, MD  Multiple Vitamin (MULTI-VITAMIN) tablet Take 1 tablet by mouth daily.    Yes [provider]  omeprazole (PRILOSEC) 40 MG capsule Take 1 capsule (40 mg total) by mouth daily. 05/04/21  Yes Bacigalupo, Marzella Schlein, MD  polyethylene glycol (MIRALAX / GLYCOLAX) 17 g packet Take 17 g by mouth daily as needed for mild constipation. 07/26/21  Yes Lurene Shadow, MD  pregabalin (LYRICA) 150 MG capsule Take 1 capsule (150 mg total) by mouth every 8 (eight) hours. 04/03/21  Yes Malva Limes, MD  ramipril (ALTACE) 2.5 MG capsule Take 1 capsule (2.5 mg total) by mouth daily. 10/21/20 10/21/21 Yes Burnette, Alessandra Bevels, PA-C  rOPINIRole (REQUIP) 2 MG tablet Take 1 tablet (2 mg total) by mouth at bedtime. 07/26/21  Yes Lurene Shadow, MD  spironolactone (ALDACTONE) 25 MG tablet Take 1 tablet (25 mg total) by mouth daily. 04/03/21  Yes Malva Limes, MD  Tiotropium Bromide-Olodaterol (STIOLTO RESPIMAT) 2.5-2.5 MCG/ACT AERS Inhale 2 puffs into the lungs daily. 07/07/21  Yes Salena Saner, MD  tizanidine (ZANAFLEX) 2 MG capsule Take 1 capsule (2 mg total) by mouth 3 (three) times daily as needed for muscle spasms. 05/04/21  Yes Suzanne Downer, MD  torsemide (DEMADEX) 20 MG tablet TAKE 2 TABLETS TWICE A DAY 06/18/21  Yes Bacigalupo, Marzella Schlein, MD  vitamin B-12 (CYANOCOBALAMIN) 500 MCG tablet Take 1 tablet (500 mcg total) by mouth daily. 10/21/20  Yes Margaretann Loveless, PA-C  enoxaparin (LOVENOX) 40 MG/0.4ML injection  Inject 0.4 mLs (40 mg total) into the skin daily for 14 days. 07/23/21 08/06/21  Evon Slack, PA-C  HYDROcodone-acetaminophen (NORCO) 7.5-325 MG tablet Take 1 tablet by mouth every 6 (six) hours as needed for severe pain. Must last 30 days 08/02/21 09/01/21  Delano Metz, MD  HYDROcodone-acetaminophen (NORCO) 7.5-325 MG tablet Take 1 tablet by mouth every 6 (six) hours as needed for severe pain. Must last 30 days 09/01/21 10/01/21  Delano Metz, MD  traMADol (ULTRAM) 50 MG tablet Take 1 tablet (50 mg total) by mouth every 6 (six) hours as needed. 07/23/21   Evon Slack, PA-C   Physical Exam: Vitals:   2021/09/13 1243 2021/09/13 1250 09-13-21 1252  BP:   (!) 135/119  Pulse:   100  Resp:   (!) 26  Temp:   (!) 97.3 F (36.3 C)  TempSrc:   Axillary  SpO2: 94%  100%  Weight:  40.4 kg    Constitutional: appears older than chronological age, frail, cachectic, NAD, calm, comfortable Eyes: PERRL,  lids and conjunctivae normal ENMT: Mucous membranes are moist. Posterior pharynx clear of any exudate or lesions. Age-appropriate dentition. Hearing appropriate Neck: normal, supple, no masses, no thyromegaly Respiratory: clear to auscultation bilaterally, no wheezing, no crackles.  Increased respiratory effort.  Positive for accessory muscle use.  Cardiovascular: Regular rate and rhythm, no murmurs / rubs / gallops. No extremity edema. 2+ pedal pulses. No carotid bruits.  Abdomen: no tenderness, no masses palpated, no hepatosplenomegaly. Bowel sounds positive.  Musculoskeletal: no clubbing / cyanosis. No joint deformity upper and lower extremities. Good ROM, no contractures, no atrophy. Normal muscle tone.  Skin: no rashes, lesions, ulcers. No induration Neurologic: Sensation intact. Strength 5/5 in all 4.  Psychiatric: Normal judgment and insight. Alert and oriented x 3. Normal mood.   EKG: independently reviewed, showing sinus rhythm with rate of 93, QTc 430  Chest x-ray on Admission: I  personally reviewed and I agree with radiologist reading as below.  DG Chest Portable 1 View  Result Date: September 09, 2021 CLINICAL DATA:  Breath and hypoxia. EXAM: PORTABLE CHEST 1 VIEW COMPARISON:  07/21/2021 FINDINGS: Heart size is normal. There are increased patchy infiltrates in the LOWER lobes bilaterally and the RIGHT lung apex. Findings are consistent with infectious/inflammatory process. Partially imaged small bowel loops show mildly distended loops in the UPPER abdomen. No evidence for free intraperitoneal air. IMPRESSION: 1. Multifocal pulmonary infiltrates. 2. Question of small bowel dilatation. Consider two-view abdomen if needed. Electronically Signed   By: Norva Pavlov M.D.   On: September 09, 2021 13:40   CT CHEST ABDOMEN PELVIS WO CONTRAST  Result Date: 09-Sep-2021 CLINICAL DATA:  Nonlocalized abdominal pain. Multifocal pneumonia on chest x-ray. EXAM: CT CHEST, ABDOMEN AND PELVIS WITHOUT CONTRAST TECHNIQUE: Multidetector CT imaging of the chest, abdomen and pelvis was performed following the standard protocol without IV contrast. COMPARISON:  Chest x-ray Sep 09, 2021, CT lumbar spine 06/15/2021, CT chest 12/22/2020, CT chest 07/19/2019 FINDINGS: CT CHEST FINDINGS Cardiovascular: Normal heart size. No significant pericardial effusion. The thoracic aorta is normal in caliber. Severe atherosclerotic plaque of the thoracic aorta. Three-vessel coronary artery calcifications. Mediastinum/Nodes: No enlarged mediastinal, hilar, or axillary lymph nodes. Thyroid gland, trachea, and esophagus demonstrate no significant findings. Lungs/Pleura: Interval further decrease of a 0.9 x 0.6 cm (from 1.7 x 2.2 cm) right upper lobe cavitary lesion (4:43). Associated traction bronchiectasis. Interval development of scattered peribronchovascular patchy nodular-like consolidations and centrilobular nodules. No pleural effusion. No pneumothorax. Musculoskeletal: No chest wall abnormality. No suspicious lytic or blastic  osseous lesions. No acute displaced fracture. Multilevel degenerative changes of the spine. CT ABDOMEN PELVIS FINDINGS Hepatobiliary: No focal liver abnormality. No definite gallstones, gallbladder wall thickening, or pericholecystic fluid. No biliary dilatation. Pancreas: No focal lesion. Normal pancreatic contour. No surrounding inflammatory changes. No main pancreatic ductal dilatation. Spleen: Normal in size without focal abnormality. Adrenals/Urinary Tract: No adrenal nodule bilaterally. Right renal atrophy. Left renal calcification likely vascular. No nephrolithiasis and no hydronephrosis. No definite contour-deforming renal mass. No ureterolithiasis or hydroureter. The urinary bladder is unremarkable. Stomach/Bowel: Stomach is within normal limits. The small bowel is distended with free fluid. No pneumatosis. No evidence of bowel wall thickening or dilatation. Appendix appears normal. Vascular/Lymphatic: No abdominal aorta or iliac aneurysm. Severe atherosclerotic plaque of the aorta and its branches. No abdominal, pelvic, or inguinal lymphadenopathy. Reproductive: Uterus and bilateral adnexa are unremarkable. Other: No intraperitoneal free fluid. No intraperitoneal free gas. No organized fluid collection. Musculoskeletal: No abdominal wall hernia or abnormality.  Diastasis rectus. No suspicious lytic or blastic osseous lesions.  Age-indeterminate compression fracture of the T4 vertebral body with greater than 80% height loss. Chronic, slightly worsened, T11 compression fracture with greater than 80% height loss. Multilevel degenerative changes of the spine with endplate sclerosis at the L2-L3 and L3-L4 level, multilevel intervertebral disc space vacuum phenomenon, diffusely decreased bone density. Status post L4-S1 posterolateral fusion. IMPRESSION: 1. Multifocal pneumonia, likely atypical. Non-contrast chest CT at 3-6 months is recommended. If the nodules are stable at time of repeat CT, then future CT at  18-24 months (from today's scan) is considered optional for low-risk patients, but is recommended for high-risk patients. This recommendation follows the consensus statement: Guidelines for Management of Incidental Pulmonary Nodules Detected on CT Images: From the Fleischner Society 2017; Radiology 2017; 284:228-243. 2. Interval further decrease of a 0.9 x 0.6 cm (from 1.7 x 2.2 cm) right upper lobe cavitary lesion that likely represents a resolving infection. 3. Nonspecific diffuse distension of the small bowel with fluid. Limited evaluation on this noncontrast study. 4. Age-indeterminate compression fracture of the T4 vertebral body with greater than 80% height loss. Chronic, slightly worsened, T11 compression fracture with greater than 80% height loss. Diffusely decreased bone density in a multilevel degenerative changes status post L4-S1 posterolateral fusion. 5.  Aortic Atherosclerosis (ICD10-I70.0). 6. Atrophic right kidney. Electronically Signed   By: Tish Frederickson M.D.   On: 08/20/2021 15:41    Labs on Admission: I have personally reviewed following labs  CBC: Recent Labs  Lab 08/08/2021 1248  WBC 7.1  HGB 11.3*  HCT 32.8*  MCV 83.7  PLT 229   Basic Metabolic Panel: Recent Labs  Lab 07/29/2021 1248  NA 120*  K 5.1  CL 84*  CO2 16*  GLUCOSE 74  BUN 89*  CREATININE 3.94*  CALCIUM 7.0*   GFR: Estimated Creatinine Clearance: 7 mL/min (A) (by C-G formula based on SCr of 3.94 mg/dL (H)).  Liver Function Tests: Recent Labs  Lab 08/23/2021 1248  AST 136*  ALT 68*  ALKPHOS 111  BILITOT 0.8  PROT 6.3*  ALBUMIN 3.0*   Coagulation Profile: Recent Labs  Lab 08/22/2021 1248  INR 1.2   Urine analysis:    Component Value Date/Time   COLORURINE YELLOW (A) 09/14/2020 1629   APPEARANCEUR HAZY (A) 09/14/2020 1629   APPEARANCEUR Clear 03/22/2014 2246   LABSPEC 1.009 09/14/2020 1629   LABSPEC 1.002 03/22/2014 2246   PHURINE 6.0 09/14/2020 1629   GLUCOSEU NEGATIVE 09/14/2020 1629    GLUCOSEU Negative 03/22/2014 2246   HGBUR NEGATIVE 09/14/2020 1629   BILIRUBINUR NEGATIVE 09/14/2020 1629   BILIRUBINUR Negative 03/22/2014 2246   KETONESUR NEGATIVE 09/14/2020 1629   PROTEINUR NEGATIVE 09/14/2020 1629   NITRITE NEGATIVE 09/14/2020 1629   LEUKOCYTESUR LARGE (A) 09/14/2020 1629   LEUKOCYTESUR Negative 03/22/2014 2246   CRITICAL CARE Performed by: Nadyne Coombes Ky Rumple  Total critical care time: 35 minutes  Critical care time was exclusive of separately billable procedures and treating other patients.  Critical care was necessary to treat or prevent imminent or life-threatening deterioration. Respiratory failure, severe sepsis  Critical care was time spent personally by me on the following activities: development of treatment plan with patient and/or surrogate as well as nursing, discussions with consultants, evaluation of patient's response to treatment, examination of patient, obtaining history from patient or surrogate, ordering and performing treatments and interventions, ordering and review of laboratory studies, ordering and review of radiographic studies, pulse oximetry and re-evaluation of patient's condition.  Dr. Sedalia Muta [facility]  If 7PM-7AM, please contact overnight-coverage provider  If 7AM-7PM, please contact day coverage provider [email or web address]  08-30-2021, 4:32 PM

## 2021-08-15 NOTE — ED Triage Notes (Signed)
Patient to ED via [facility] from home for respiratory distress and abd pain for the "last couple hours." Last BM was 2 days ago. Patient was initially 74% on RA at home per EMS. Patient was recently dc'd from rehab for a broken leg. Hx of afib

## 2021-08-15 NOTE — ED Notes (Signed)
Patient denies need to void. Will take meal to floor.

## 2021-08-16 ENCOUNTER — Inpatient Hospital Stay: Payer: Medicare Other

## 2021-08-16 ENCOUNTER — Observation Stay: Payer: Medicare Other

## 2021-08-16 ENCOUNTER — Telehealth: Payer: Self-pay | Admitting: Family Medicine

## 2021-08-16 DIAGNOSIS — G2581 Restless legs syndrome: Secondary | ICD-10-CM | POA: Diagnosis present

## 2021-08-16 DIAGNOSIS — I480 Paroxysmal atrial fibrillation: Secondary | ICD-10-CM | POA: Diagnosis not present

## 2021-08-16 DIAGNOSIS — F32A Depression, unspecified: Secondary | ICD-10-CM | POA: Diagnosis present

## 2021-08-16 DIAGNOSIS — I110 Hypertensive heart disease with heart failure: Secondary | ICD-10-CM | POA: Diagnosis present

## 2021-08-16 DIAGNOSIS — A419 Sepsis, unspecified organism: Secondary | ICD-10-CM | POA: Diagnosis not present

## 2021-08-16 DIAGNOSIS — E8721 Acute metabolic acidosis: Secondary | ICD-10-CM | POA: Diagnosis present

## 2021-08-16 DIAGNOSIS — R140 Abdominal distension (gaseous): Secondary | ICD-10-CM | POA: Diagnosis not present

## 2021-08-16 DIAGNOSIS — I472 Ventricular tachycardia, unspecified: Secondary | ICD-10-CM | POA: Diagnosis not present

## 2021-08-16 DIAGNOSIS — R64 Cachexia: Secondary | ICD-10-CM | POA: Diagnosis present

## 2021-08-16 DIAGNOSIS — J984 Other disorders of lung: Secondary | ICD-10-CM | POA: Diagnosis not present

## 2021-08-16 DIAGNOSIS — R6521 Severe sepsis with septic shock: Secondary | ICD-10-CM

## 2021-08-16 DIAGNOSIS — Z66 Do not resuscitate: Secondary | ICD-10-CM | POA: Diagnosis not present

## 2021-08-16 DIAGNOSIS — E878 Other disorders of electrolyte and fluid balance, not elsewhere classified: Secondary | ICD-10-CM | POA: Diagnosis present

## 2021-08-16 DIAGNOSIS — J9621 Acute and chronic respiratory failure with hypoxia: Secondary | ICD-10-CM | POA: Diagnosis present

## 2021-08-16 DIAGNOSIS — N179 Acute kidney failure, unspecified: Secondary | ICD-10-CM | POA: Diagnosis not present

## 2021-08-16 DIAGNOSIS — Z20822 Contact with and (suspected) exposure to covid-19: Secondary | ICD-10-CM | POA: Diagnosis present

## 2021-08-16 DIAGNOSIS — R0902 Hypoxemia: Secondary | ICD-10-CM | POA: Diagnosis not present

## 2021-08-16 DIAGNOSIS — J189 Pneumonia, unspecified organism: Secondary | ICD-10-CM | POA: Diagnosis present

## 2021-08-16 DIAGNOSIS — E440 Moderate protein-calorie malnutrition: Secondary | ICD-10-CM | POA: Diagnosis not present

## 2021-08-16 DIAGNOSIS — J441 Chronic obstructive pulmonary disease with (acute) exacerbation: Secondary | ICD-10-CM | POA: Diagnosis present

## 2021-08-16 DIAGNOSIS — N170 Acute kidney failure with tubular necrosis: Secondary | ICD-10-CM | POA: Diagnosis present

## 2021-08-16 DIAGNOSIS — R6520 Severe sepsis without septic shock: Secondary | ICD-10-CM | POA: Diagnosis not present

## 2021-08-16 DIAGNOSIS — D6959 Other secondary thrombocytopenia: Secondary | ICD-10-CM | POA: Diagnosis present

## 2021-08-16 DIAGNOSIS — L8990 Pressure ulcer of unspecified site, unspecified stage: Secondary | ICD-10-CM | POA: Insufficient documentation

## 2021-08-16 DIAGNOSIS — D6489 Other specified anemias: Secondary | ICD-10-CM | POA: Diagnosis present

## 2021-08-16 DIAGNOSIS — Z7189 Other specified counseling: Secondary | ICD-10-CM | POA: Diagnosis not present

## 2021-08-16 DIAGNOSIS — J9601 Acute respiratory failure with hypoxia: Secondary | ICD-10-CM | POA: Diagnosis not present

## 2021-08-16 DIAGNOSIS — Z515 Encounter for palliative care: Secondary | ICD-10-CM | POA: Diagnosis not present

## 2021-08-16 DIAGNOSIS — R52 Pain, unspecified: Secondary | ICD-10-CM | POA: Diagnosis not present

## 2021-08-16 DIAGNOSIS — G928 Other toxic encephalopathy: Secondary | ICD-10-CM | POA: Diagnosis present

## 2021-08-16 DIAGNOSIS — E43 Unspecified severe protein-calorie malnutrition: Secondary | ICD-10-CM | POA: Diagnosis present

## 2021-08-16 DIAGNOSIS — I5032 Chronic diastolic (congestive) heart failure: Secondary | ICD-10-CM | POA: Diagnosis present

## 2021-08-16 DIAGNOSIS — E871 Hypo-osmolality and hyponatremia: Secondary | ICD-10-CM | POA: Diagnosis present

## 2021-08-16 DIAGNOSIS — J690 Pneumonitis due to inhalation of food and vomit: Secondary | ICD-10-CM | POA: Diagnosis present

## 2021-08-16 DIAGNOSIS — K838 Other specified diseases of biliary tract: Secondary | ICD-10-CM | POA: Diagnosis not present

## 2021-08-16 LAB — BLOOD GAS, ARTERIAL
Acid-base deficit: 15.1 mmol/L — ABNORMAL HIGH (ref 0.0–2.0)
Acid-base deficit: 13.6 mmol/L — ABNORMAL HIGH (ref 0.0–2.0)
Acid-base deficit: 8.9 mmol/L — ABNORMAL HIGH (ref 0.0–2.0)
Bicarbonate: 10.1 mmol/L — ABNORMAL LOW (ref 20.0–28.0)
Bicarbonate: 13.8 mmol/L — ABNORMAL LOW (ref 20.0–28.0)
Bicarbonate: 16.7 mmol/L — ABNORMAL LOW (ref 20.0–28.0)
FIO2: 0.28
FIO2: 0.4
FIO2: 0.4
MECHVT: 400 mL
MECHVT: 400 mL
Mechanical Rate: 16
O2 Saturation: 97.8 %
O2 Saturation: 96.2 %
O2 Saturation: 99.1 %
PEEP: 5 cmH2O
PEEP: 5 cmH2O
Patient temperature: 37
Patient temperature: 37
Patient temperature: 37
RATE: 16 resp/min
RATE: 22 resp/min
pCO2 arterial: 22 mmHg — ABNORMAL LOW (ref 32.0–48.0)
pCO2 arterial: 37 mmHg (ref 32.0–48.0)
pCO2 arterial: 34 mmHg (ref 32.0–48.0)
pH, Arterial: 7.27 — ABNORMAL LOW (ref 7.350–7.450)
pH, Arterial: 7.18 — CL (ref 7.350–7.450)
pH, Arterial: 7.3 — ABNORMAL LOW (ref 7.350–7.450)
pO2, Arterial: 114 mmHg — ABNORMAL HIGH (ref 83.0–108.0)
pO2, Arterial: 103 mmHg (ref 83.0–108.0)
pO2, Arterial: 151 mmHg — ABNORMAL HIGH (ref 83.0–108.0)

## 2021-08-16 LAB — URINALYSIS, COMPLETE (UACMP) WITH MICROSCOPIC
Bilirubin Urine: NEGATIVE
Glucose, UA: NEGATIVE mg/dL
Ketones, ur: NEGATIVE mg/dL
Leukocytes,Ua: NEGATIVE
Nitrite: NEGATIVE
Protein, ur: 30 mg/dL — AB
Specific Gravity, Urine: 1.012 (ref 1.005–1.030)
pH: 5 (ref 5.0–8.0)

## 2021-08-16 LAB — MAGNESIUM: Magnesium: 1.8 mg/dL (ref 1.7–2.4)

## 2021-08-16 LAB — SODIUM
Sodium: 122 mmol/L — ABNORMAL LOW (ref 135–145)
Sodium: 124 mmol/L — ABNORMAL LOW (ref 135–145)

## 2021-08-16 LAB — CBC
HCT: 31.9 % — ABNORMAL LOW (ref 36.0–46.0)
Hemoglobin: 10.8 g/dL — ABNORMAL LOW (ref 12.0–15.0)
MCH: 27.7 pg (ref 26.0–34.0)
MCHC: 33.9 g/dL (ref 30.0–36.0)
MCV: 81.8 fL (ref 80.0–100.0)
Platelets: 210 10*3/uL (ref 150–400)
RBC: 3.9 MIL/uL (ref 3.87–5.11)
RDW: 17.9 % — ABNORMAL HIGH (ref 11.5–15.5)
WBC: 14.3 10*3/uL — ABNORMAL HIGH (ref 4.0–10.5)
nRBC: 0 % (ref 0.0–0.2)

## 2021-08-16 LAB — BASIC METABOLIC PANEL
Anion gap: 18 — ABNORMAL HIGH (ref 5–15)
Anion gap: 15 (ref 5–15)
BUN: 95 mg/dL — ABNORMAL HIGH (ref 8–23)
BUN: 97 mg/dL — ABNORMAL HIGH (ref 8–23)
CO2: 14 mmol/L — ABNORMAL LOW (ref 22–32)
CO2: 16 mmol/L — ABNORMAL LOW (ref 22–32)
Calcium: 6.6 mg/dL — ABNORMAL LOW (ref 8.9–10.3)
Calcium: 6.9 mg/dL — ABNORMAL LOW (ref 8.9–10.3)
Chloride: 90 mmol/L — ABNORMAL LOW (ref 98–111)
Chloride: 93 mmol/L — ABNORMAL LOW (ref 98–111)
Creatinine, Ser: 3.45 mg/dL — ABNORMAL HIGH (ref 0.44–1.00)
Creatinine, Ser: 3.34 mg/dL — ABNORMAL HIGH (ref 0.44–1.00)
GFR, Estimated: 13 mL/min — ABNORMAL LOW (ref >60)
GFR, Estimated: 14 mL/min — ABNORMAL LOW (ref >60)
Glucose, Bld: 74 mg/dL (ref 70–99)
Glucose, Bld: 125 mg/dL — ABNORMAL HIGH (ref 70–99)
Potassium: 5.3 mmol/L — ABNORMAL HIGH (ref 3.5–5.1)
Potassium: 4.8 mmol/L (ref 3.5–5.1)
Sodium: 122 mmol/L — ABNORMAL LOW (ref 135–145)
Sodium: 124 mmol/L — ABNORMAL LOW (ref 135–145)

## 2021-08-16 LAB — TROPONIN I (HIGH SENSITIVITY)
Troponin I (High Sensitivity): 359 ng/L (ref <18)
Troponin I (High Sensitivity): 337 ng/L (ref <18)

## 2021-08-16 LAB — GLUCOSE, CAPILLARY
Glucose-Capillary: 124 mg/dL — ABNORMAL HIGH (ref 70–99)
Glucose-Capillary: 123 mg/dL — ABNORMAL HIGH (ref 70–99)
Glucose-Capillary: 209 mg/dL — ABNORMAL HIGH (ref 70–99)

## 2021-08-16 LAB — LACTIC ACID, PLASMA: Lactic Acid, Venous: 1.4 mmol/L (ref 0.5–1.9)

## 2021-08-16 LAB — MRSA NEXT GEN BY PCR, NASAL
MRSA by PCR Next Gen: NOT DETECTED
MRSA by PCR Next Gen: NOT DETECTED

## 2021-08-16 LAB — CALCIUM: Calcium: 6.8 mg/dL — ABNORMAL LOW (ref 8.9–10.3)

## 2021-08-16 LAB — PHOSPHORUS: Phosphorus: 9.3 mg/dL — ABNORMAL HIGH (ref 2.5–4.6)

## 2021-08-16 LAB — PROCALCITONIN: Procalcitonin: >150 ng/mL

## 2021-08-16 MED ORDER — CHLORHEXIDINE GLUCONATE CLOTH 2 % EX PADS
6.0000 | MEDICATED_PAD | Freq: Every day | CUTANEOUS | Status: DC
  Administered 2021-08-16 – 2021-08-26 (×11): 6 via TOPICAL

## 2021-08-16 MED ORDER — CALCIUM GLUCONATE-NACL 1-0.675 GM/50ML-% IV SOLN
1.0000 g | Freq: Once | INTRAVENOUS | Status: AC
  Administered 2021-08-16: 1000 mg via INTRAVENOUS
  Filled 2021-08-16: qty 50

## 2021-08-16 MED ORDER — MAGNESIUM SULFATE 2 GM/50ML IV SOLN
2.0000 g | Freq: Once | INTRAVENOUS | Status: AC
  Administered 2021-08-16: 2 g via INTRAVENOUS
  Filled 2021-08-16: qty 50

## 2021-08-16 MED ORDER — NOREPINEPHRINE 4 MG/250ML-% IV SOLN
INTRAVENOUS | Status: AC
  Administered 2021-08-16: 4 mg
  Filled 2021-08-16: qty 250

## 2021-08-16 MED ORDER — FENTANYL 2500MCG IN NS 250ML (10MCG/ML) PREMIX INFUSION
25.0000 ug/h | INTRAVENOUS | Status: DC
  Administered 2021-08-16: 50 ug/h via INTRAVENOUS
  Administered 2021-08-17: 125 ug/h via INTRAVENOUS
  Administered 2021-08-18: 150 ug/h via INTRAVENOUS
  Administered 2021-08-20: 50 ug/h via INTRAVENOUS
  Filled 2021-08-16 (×5): qty 250

## 2021-08-16 MED ORDER — FENTANYL CITRATE PF 50 MCG/ML IJ SOSY: 25.0000 ug | PREFILLED_SYRINGE | Freq: Once | INTRAMUSCULAR | Status: DC

## 2021-08-16 MED ORDER — METRONIDAZOLE 500 MG/100ML IV SOLN
500.0000 mg | Freq: Two times a day (BID) | INTRAVENOUS | Status: DC
  Administered 2021-08-16 (×2): 500 mg via INTRAVENOUS
  Filled 2021-08-16 (×3): qty 100

## 2021-08-16 MED ORDER — ETOMIDATE 2 MG/ML IV SOLN
20.0000 mg | Freq: Once | INTRAVENOUS | Status: AC
  Administered 2021-08-16: 20 mg via INTRAVENOUS
  Filled 2021-08-16: qty 10

## 2021-08-16 MED ORDER — SODIUM BICARBONATE 8.4 % IV SOLN
INTRAVENOUS | Status: DC
  Filled 2021-08-16: qty 150
  Filled 2021-08-16: qty 1000
  Filled 2021-08-16: qty 150

## 2021-08-16 MED ORDER — FENTANYL BOLUS VIA INFUSION
25.0000 ug | INTRAVENOUS | Status: DC | PRN
  Administered 2021-08-17: 100 ug via INTRAVENOUS
  Administered 2021-08-17 – 2021-08-18 (×5): 50 ug via INTRAVENOUS
  Administered 2021-08-18: 25 ug via INTRAVENOUS
  Administered 2021-08-18 – 2021-08-20 (×3): 50 ug via INTRAVENOUS
  Administered 2021-08-20 (×2): 75 ug via INTRAVENOUS
  Filled 2021-08-16: qty 100

## 2021-08-16 MED ORDER — FENTANYL CITRATE PF 50 MCG/ML IJ SOSY
50.0000 ug | PREFILLED_SYRINGE | Freq: Once | INTRAMUSCULAR | Status: AC
  Administered 2021-08-16: 50 ug via INTRAVENOUS
  Filled 2021-08-16: qty 1

## 2021-08-16 MED ORDER — SODIUM CHLORIDE 0.9 % IV SOLN
1.0000 g | Freq: Two times a day (BID) | INTRAVENOUS | Status: DC
  Filled 2021-08-16 (×2): qty 1

## 2021-08-16 MED ORDER — MIDAZOLAM HCL 2 MG/2ML IJ SOLN
1.0000 mg | INTRAMUSCULAR | Status: AC | PRN
  Administered 2021-08-18 – 2021-08-20 (×3): 1 mg via INTRAVENOUS
  Filled 2021-08-16 (×4): qty 2

## 2021-08-16 MED ORDER — SODIUM CHLORIDE 0.9 % IV SOLN
1.0000 g | INTRAVENOUS | Status: DC
  Administered 2021-08-16: 1 g via INTRAVENOUS
  Filled 2021-08-16: qty 1

## 2021-08-16 MED ORDER — PANTOPRAZOLE SODIUM 40 MG IV SOLR
40.0000 mg | INTRAVENOUS | Status: DC
  Administered 2021-08-16 – 2021-08-20 (×5): 40 mg via INTRAVENOUS
  Filled 2021-08-16 (×5): qty 40

## 2021-08-16 MED ORDER — DOCUSATE SODIUM 100 MG PO CAPS: 100.0000 mg | ORAL_CAPSULE | Freq: Two times a day (BID) | ORAL | Status: DC | PRN

## 2021-08-16 MED ORDER — NOREPINEPHRINE 4 MG/250ML-% IV SOLN: 2.0000 ug/min | INTRAVENOUS | Status: DC

## 2021-08-16 MED ORDER — LEVALBUTEROL HCL 0.63 MG/3ML IN NEBU
0.6300 mg | INHALATION_SOLUTION | Freq: Four times a day (QID) | RESPIRATORY_TRACT | Status: DC
  Administered 2021-08-16 – 2021-08-21 (×18): 0.63 mg via RESPIRATORY_TRACT
  Filled 2021-08-16 (×19): qty 3

## 2021-08-16 MED ORDER — PHENYLEPHRINE HCL-NACL 20-0.9 MG/250ML-% IV SOLN
25.0000 ug/min | INTRAVENOUS | Status: DC
  Administered 2021-08-16: 50 ug/min via INTRAVENOUS
  Administered 2021-08-17: 92.5 ug/min via INTRAVENOUS
  Administered 2021-08-17: 100 ug/min via INTRAVENOUS
  Administered 2021-08-17: 125 ug/min via INTRAVENOUS
  Administered 2021-08-17: 90 ug/min via INTRAVENOUS
  Administered 2021-08-17: 110 ug/min via INTRAVENOUS
  Administered 2021-08-17 – 2021-08-18 (×2): 100 ug/min via INTRAVENOUS
  Administered 2021-08-18: 75 ug/min via INTRAVENOUS
  Administered 2021-08-18 (×2): 55 ug/min via INTRAVENOUS
  Administered 2021-08-18: 65 ug/min via INTRAVENOUS
  Administered 2021-08-19: 15 ug/min via INTRAVENOUS
  Filled 2021-08-16 (×16): qty 250

## 2021-08-16 MED ORDER — MIDAZOLAM HCL 2 MG/2ML IJ SOLN
1.0000 mg | INTRAMUSCULAR | Status: DC | PRN
  Administered 2021-08-20: 1 mg via INTRAVENOUS
  Filled 2021-08-16 (×2): qty 2

## 2021-08-16 MED ORDER — FLEET ENEMA 7-19 GM/118ML RE ENEM: 1.0000 | ENEMA | Freq: Every day | RECTAL | Status: DC | PRN

## 2021-08-16 MED ORDER — VANCOMYCIN VARIABLE DOSE PER UNSTABLE RENAL FUNCTION (PHARMACIST DOSING): Status: DC

## 2021-08-16 MED ORDER — DOCUSATE SODIUM 50 MG/5ML PO LIQD
100.0000 mg | Freq: Two times a day (BID) | ORAL | Status: DC
  Administered 2021-08-16 – 2021-08-18 (×4): 100 mg
  Filled 2021-08-16 (×4): qty 10

## 2021-08-16 MED ORDER — ROCURONIUM BROMIDE 50 MG/5ML IV SOLN
40.0000 mg | Freq: Once | INTRAVENOUS | Status: AC
  Filled 2021-08-16: qty 4

## 2021-08-16 MED ORDER — ORAL CARE MOUTH RINSE
15.0000 mL | Freq: Two times a day (BID) | OROMUCOSAL | Status: DC
  Administered 2021-08-16 (×2): 15 mL via OROMUCOSAL

## 2021-08-16 MED ORDER — ROCURONIUM BROMIDE 10 MG/ML (PF) SYRINGE
PREFILLED_SYRINGE | INTRAVENOUS | Status: AC
  Administered 2021-08-16: 40 mg via INTRAVENOUS
  Filled 2021-08-16: qty 10

## 2021-08-16 MED ORDER — SODIUM CHLORIDE 0.9 % IV SOLN
250.0000 mL | INTRAVENOUS | Status: DC
  Administered 2021-08-18 – 2021-08-23 (×2): 250 mL via INTRAVENOUS

## 2021-08-16 MED ORDER — BISACODYL 5 MG PO TBEC
5.0000 mg | DELAYED_RELEASE_TABLET | Freq: Every day | ORAL | Status: DC | PRN
  Administered 2021-08-16: 02:00:00 5 mg via ORAL
  Filled 2021-08-16: qty 1

## 2021-08-16 MED ORDER — CALCIUM GLUCONATE-NACL 1-0.675 GM/50ML-% IV SOLN: 1.0000 g | Freq: Once | INTRAVENOUS | Status: DC

## 2021-08-16 MED ORDER — LACTULOSE 10 GM/15ML PO SOLN
20.0000 g | Freq: Once | ORAL | Status: DC
Start: 2021-08-16 — End: 2021-08-16

## 2021-08-16 MED ORDER — SODIUM CHLORIDE 0.9 % IV BOLUS
500.0000 mL | Freq: Once | INTRAVENOUS | Status: AC
  Administered 2021-08-16: 10:00:00 500 mL via INTRAVENOUS

## 2021-08-16 NOTE — Procedures (Signed)
Endotracheal Intubation: Patient required placement of an artificial airway secondary to acute hypoxic respiratory failure    Consent: Emergent.    Hand washing performed prior to starting the procedure.    Medications administered for sedation prior to procedure:  Fentanyl 50 mcg IV, 20 mg Etomidate IV, Rocuronium 40 mg IV     A time out procedure was called and correct patient, name, & ID confirmed. Needed supplies and equipment were assembled and checked to include ETT, 10 ml syringe, Glidescope, Mac and Miller blades, suction, oxygen and bag mask valve, end tidal CO2 monitor.    Patient was positioned to align the mouth and pharynx to facilitate visualization of the glottis.    Heart rate, SpO2 and blood pressure was continuously monitored during the procedure. Pre-oxygenation was conducted prior to intubation and endotracheal tube was placed through the vocal cords into the trachea.       The artificial airway was placed under direct visualization via glidescope route using a 7.5 cm ETT on the first attempt.   ETT was secured at 23 cm.   Placement was confirmed by auscuitation of lungs with good breath sounds bilaterally and no stomach sounds.  Condensation was noted on endotracheal tube.   Pulse ox 98%   CO2 detector in place with appropriate color change.    Complications: None .        Chest radiograph ordered and pending.   Rosilyn Mings, AGNP  Pulmonary/Critical Care Pager [phone] (please enter 7 digits) PCCM Consult Pager [phone] (please enter 7 digits)

## 2021-08-16 NOTE — Telephone Encounter (Signed)
Please review. KW 

## 2021-08-16 NOTE — Progress Notes (Addendum)
PROGRESS NOTE    Suzanne Avila  YSA:[id number] DOB: May 29, 1946 DOA: 07/27/2021 PCP: Gwyneth Sprout, FNP   Complaint for shortness of breath. Brief Narrative:  Suzanne Avila is a 75 y.o. female with medical history significant for chronic pain syndrome, hypertension, hyperlipidemia, paroxysmal atrial fibrillation, heart failure preserved ejection fraction, GERD, who presents emergency department for chief concerns of shortness of breath. Spoke with patient son, patient just left SNF last week, doing well.  She started have's sickness on Friday night.  She has a lot of diarrhea, she was not vomiting.  She has not been eating for the last 3 days.  She has been very confused. By time she arrived in the hospital, patient was having severe hypoxia, and was initially put on 6 L oxygen, now down to 2 L. Her creatinine was 3.94, compared to normal on 9/30. 10/24.  ABG showed severe metabolic acidosis.  Started bicarb drip, transfer to ICU stepdown unit.   Assessment & Plan:   Principal Problem:   Severe sepsis with acute organ dysfunction (HCC) Active Problems:   Chronic pain syndrome   Acid reflux   Essential hypertension   Hypercholesterolemia without hypertriglyceridemia   Tobacco use disorder   Cavitary lesion of lung   AF (paroxysmal atrial fibrillation) (HCC)   Femur fracture, right (HCC)   AKI (acute kidney injury) ([place])   Depression   Acute hypoxemic respiratory failure (HCC)  Acute on chronic hypoxemic respiratory failure secondary to multilobar pneumonia Septic shock secondary to multilobar pneumonia. Multilobar pneumonia. Elevated troponin secondary to septic shock. Acute metabolic encephalopathy secondary to septic shock. Patient had oxygen saturation of 79% when she arrived in the hospital, she had a significant short of breath and some tachypnea.  She was initially put on 6 L oxygen, now down to 2 L oxygen. Patient met sepsis criteria with tachycardia, leukocytosis.   Procalcitonin level more than 150.  Lactic acid 2.4.  Patient also developed multiorgan failure, including acute renal failure, acute respiratory failure, severe metabolic acidosis and lactic acidosis. Patient be transferred to the ICU stepdown unit dose monitoring. I will continue antibiotics with cefepime and Flagyl.  Due to severity of disease, I also added vancomycin.  Check MRSA culture.  May discontinue vancomycin if MRSA culture negative. Head CT for altered mental status.  Severe acute renal failure secondary to sepsis and dehydration. Severe metabolic acidosis. Lactic acidosis. Severe hyponatremia secondary to dehydration  Hyperkalemia secondary to metabolic acidosis. Acute gastroenteritis.  Reviewed previous lab, patient renal function was normal last months.  She has significant diarrhea and poor appetite, there is a degree of dehydration.  ABG showed severe metabolic acidosis. She also had acute hyponatremia due to dehydration. At this point, I will start bicarb drip. Also obtain renal ultrasound.  Possible lung nodule. Patient be followed with pulmonology after discharge from hospital.  COPD. No bronchospasm at this time.  Paroxysmal defibrillation. Currently rate under control.  Severe protein calorie malnutrition. Will start protein supplement when patient able to take p.o.  T4 and T11 compression fracture. Follow.  Hypocalcemia. Will give IV calcium gluconate.  Prognosis. Patient condition is very severe, high probability of mortality.  We will transfer to ICU stepdown unit.  We will continue monitor closely.  1340. Patient condition is not improving, she did not make any urine, she became volume overloaded.  Discontinue fluids.  Consulted nephrology and intensive care.  Patient has multiorgan failure now, impending respiratory failure due to volume overload.  We will transfer patient  to ICU.  DVT prophylaxis: Heparin Code Status: full Family Communication:  Had a long discussion with son, all questions answered. Disposition Plan:    Status is: Observation  The patient will require care spanning > 2 midnights and should be moved to inpatient because: Septic shock, needed treatment in ICU stepdown unit.       I/O last 3 completed shifts: In: 1867.4 [I.V.:361.2; IV Piggyback:1506.2] Out: 600 [Urine:600] No intake/output data recorded.     Consultants:  None  Procedures: None  Antimicrobials: Vancomycin, cefepime and Flagyl.  Subjective: Very confused, has been having abdominal pain overnight, discussed with patient and son, patient having having significant diarrhea for the last 3 days. She is now down on 2 L oxygen, short of breath when talking to me. Denies any dysuria hematuria  No fever or chills.   Objective: Vitals:   07/25/2021 2026 08/05/2021 2357 08/16/21 0432 08/16/21 0831  BP: (!) 98/47 96/65 100/70 (!) 97/52  Pulse: 99 99 97 97  Resp: _0 Temp: 98.3 F (36.8 C) 98.2 F (36.8 C) 97.9 F (36.6 C) 97.8 F (36.6 C)  TempSrc: Oral Oral Oral Oral  SpO2: 99% 97% 97% 99%  Weight:        Intake/Output Summary (Last 24 hours) at 08/16/2021 0913 Last data filed at 08/16/2021 0431 Gross per 24 hour  Intake 1867.4 ml  Output 600 ml  Net 1267.4 ml   Filed Weights   08/22/2021 1250  Weight: 40.4 kg    Examination:  General exam: Ill-appearing, very confused.  Appear malnourished. Respiratory system: Rhonchi in the base. Respiratory effort normal. Cardiovascular system: S1 & S2 heard, RRR. No JVD, murmurs, rubs, gallops or clicks. No pedal edema. Gastrointestinal system: Abdomen is distended, soft and nontender. No organomegaly or masses felt. Normal bowel sounds heard. Central nervous system: Alert and oriented x1. No focal neurological deficits. Extremities: Symmetric  Skin: No rashes, lesions or ulcers     Data Reviewed: I have personally reviewed following labs and imaging studies  CBC: Recent  Labs  Lab 08/01/2021 1248 08/16/21 0441  WBC 7.1 14.3*  HGB 11.3* 10.8*  HCT 32.8* 31.9*  MCV 83.7 81.8  PLT 229 188   Basic Metabolic Panel: Recent Labs  Lab 08/14/2021 1248 08/16/21 0441  NA 120* 122*  K 5.1 5.3*  CL 84* 90*  CO2 16* 14*  GLUCOSE 74 74  BUN 89* 95*  CREATININE 3.94* 3.45*  CALCIUM 7.0* 6.6*   GFR: Estimated Creatinine Clearance: 7.9 mL/min (A) (by C-G formula based on SCr of 3.45 mg/dL (H)). Liver Function Tests: Recent Labs  Lab 08/19/2021 1248  AST 136*  ALT 68*  ALKPHOS 111  BILITOT 0.8  PROT 6.3*  ALBUMIN 3.0*   No results for input(s): LIPASE, AMYLASE in the last 168 hours. No results for input(s): AMMONIA in the last 168 hours. Coagulation Profile: Recent Labs  Lab 08/20/2021 1248  INR 1.2   Cardiac Enzymes: No results for input(s): CKTOTAL, CKMB, CKMBINDEX, TROPONINI in the last 168 hours. BNP (last 3 results) No results for input(s): PROBNP in the last 8760 hours. HbA1C: No results for input(s): HGBA1C in the last 72 hours. CBG: No results for input(s): GLUCAP in the last 168 hours. Lipid Profile: No results for input(s): CHOL, HDL, LDLCALC, TRIG, CHOLHDL, LDLDIRECT in the last 72 hours. Thyroid Function Tests: No results for input(s): TSH, T4TOTAL, FREET4, T3FREE, THYROIDAB in the last 72 hours. Anemia Panel: No results for input(s): VITAMINB12, FOLATE, FERRITIN,  TIBC, IRON, RETICCTPCT in the last 72 hours. Sepsis Labs: Recent Labs  Lab 08/07/2021 1422 08/05/2021 1728 08/16/21 0441  PROCALCITON  --  132.64 >150.00  LATICACIDVEN 2.4* 2.3*  --     Recent Results (from the past 240 hour(s))  Resp Panel by RT-PCR (Flu A&B, Covid) Nasopharyngeal Swab     Status: None   Collection Time: 08/06/2021  2:22 PM   Specimen: Nasopharyngeal Swab; Nasopharyngeal(NP) swabs in vial transport medium  Result Value Ref Range Status   SARS Coronavirus 2 by RT PCR NEGATIVE NEGATIVE Final    Comment: (NOTE) SARS-CoV-2 target nucleic acids are NOT  DETECTED.  The SARS-CoV-2 RNA is generally detectable in upper respiratory specimens during the acute phase of infection. The lowest concentration of SARS-CoV-2 viral copies this assay can detect is 138 copies/mL. A negative result does not preclude SARS-Cov-2 infection and should not be used as the sole basis for treatment or other patient management decisions. A negative result may occur with  improper specimen collection/handling, submission of specimen other than nasopharyngeal swab, presence of viral mutation(s) within the areas targeted by this assay, and inadequate number of viral copies(<138 copies/mL). A negative result must be combined with clinical observations, patient history, and epidemiological information. The expected result is Negative.  Fact Sheet for Patients:  EntrepreneurPulse.com.au  Fact Sheet for Healthcare Providers:  IncredibleEmployment.be  This test is no t yet approved or cleared by the [place] FDA and  has been authorized for detection and/or diagnosis of SARS-CoV-2 by FDA under an Emergency Use Authorization (EUA). This EUA will remain  in effect (meaning this test can be used) for the duration of the COVID-19 declaration under Section 564(b)(1) of the Act, 21 U.S.C.section 360bbb-3(b)(1), unless the authorization is terminated  or revoked sooner.       Influenza A by PCR NEGATIVE NEGATIVE Final   Influenza B by PCR NEGATIVE NEGATIVE Final    Comment: (NOTE) The Xpert Xpress SARS-CoV-2/FLU/RSV plus assay is intended as an aid in the diagnosis of influenza from Nasopharyngeal swab specimens and should not be used as a sole basis for treatment. Nasal washings and aspirates are unacceptable for Xpert Xpress SARS-CoV-2/FLU/RSV testing.  Fact Sheet for Patients: EntrepreneurPulse.com.au  Fact Sheet for Healthcare Providers: IncredibleEmployment.be  This test is not yet  approved or cleared by the [place] FDA and has been authorized for detection and/or diagnosis of SARS-CoV-2 by FDA under an Emergency Use Authorization (EUA). This EUA will remain in effect (meaning this test can be used) for the duration of the COVID-19 declaration under Section 564(b)(1) of the Act, 21 U.S.C. section 360bbb-3(b)(1), unless the authorization is terminated or revoked.  Performed at [facility], [address] [place], [place] [address]   Blood Culture (routine x 2)     Status: None (Preliminary result)   Collection Time: 08/22/2021  3:47 PM   Specimen: BLOOD  Result Value Ref Range Status   Specimen Description BLOOD LEFT ANTECUBITAL  Final   Special Requests   Final    BOTTLES DRAWN AEROBIC AND ANAEROBIC Blood Culture results may not be optimal due to an inadequate volume of blood received in culture bottles   Culture   Final    NO GROWTH < 12 HOURS Performed at [facility], [address], [place], [place] [address]    Report Status PENDING  Incomplete  Blood Culture (routine x 2)     Status: None (Preliminary result)   Collection Time: 08/21/2021  5:31 PM  Specimen: BLOOD  Result Value Ref Range Status   Specimen Description BLOOD RIGHT [facility]  Final   Special Requests   Final    BOTTLES DRAWN AEROBIC AND ANAEROBIC Blood Culture adequate volume   Culture   Final    NO GROWTH < 12 HOURS Performed at [facility], [address] [place], [place] [address]    Report Status PENDING  Incomplete         Radiology Studies: DG Chest Portable 1 View  Result Date: 08/07/2021 CLINICAL DATA:  Breath and hypoxia. EXAM: PORTABLE CHEST 1 VIEW COMPARISON:  07/21/2021 FINDINGS: Heart size is normal. There are increased patchy infiltrates in the LOWER lobes bilaterally and the RIGHT lung apex. Findings are consistent with infectious/inflammatory process. Partially imaged small bowel loops show mildly distended loops in the UPPER abdomen.  No evidence for free intraperitoneal air. IMPRESSION: 1. Multifocal pulmonary infiltrates. 2. Question of small bowel dilatation. Consider two-view abdomen if needed. Electronically Signed   By: Nolon Nations M.D.   On: 08/20/2021 13:40   CT CHEST ABDOMEN PELVIS WO CONTRAST  Result Date: 08/06/2021 CLINICAL DATA:  Nonlocalized abdominal pain. Multifocal pneumonia on chest x-ray. EXAM: CT CHEST, ABDOMEN AND PELVIS WITHOUT CONTRAST TECHNIQUE: Multidetector CT imaging of the chest, abdomen and pelvis was performed following the standard protocol without IV contrast. COMPARISON:  Chest x-ray 07/26/2021, CT lumbar spine 06/15/2021, CT chest 12/22/2020, CT chest 07/19/2019 FINDINGS: CT CHEST FINDINGS Cardiovascular: Normal heart size. No significant pericardial effusion. The thoracic aorta is normal in caliber. Severe atherosclerotic plaque of the thoracic aorta. Three-vessel coronary artery calcifications. Mediastinum/Nodes: No enlarged mediastinal, hilar, or axillary lymph nodes. Thyroid gland, trachea, and esophagus demonstrate no significant findings. Lungs/Pleura: Interval further decrease of a 0.9 x 0.6 cm (from 1.7 x 2.2 cm) right upper lobe cavitary lesion (4:43). Associated traction bronchiectasis. Interval development of scattered peribronchovascular patchy nodular-like consolidations and centrilobular nodules. No pleural effusion. No pneumothorax. Musculoskeletal: No chest wall abnormality. No suspicious lytic or blastic osseous lesions. No acute displaced fracture. Multilevel degenerative changes of the spine. CT ABDOMEN PELVIS FINDINGS Hepatobiliary: No focal liver abnormality. No definite gallstones, gallbladder wall thickening, or pericholecystic fluid. No biliary dilatation. Pancreas: No focal lesion. Normal pancreatic contour. No surrounding inflammatory changes. No main pancreatic ductal dilatation. Spleen: Normal in size without focal abnormality. Adrenals/Urinary Tract: No adrenal nodule  bilaterally. Right renal atrophy. Left renal calcification likely vascular. No nephrolithiasis and no hydronephrosis. No definite contour-deforming renal mass. No ureterolithiasis or hydroureter. The urinary bladder is unremarkable. Stomach/Bowel: Stomach is within normal limits. The small bowel is distended with free fluid. No pneumatosis. No evidence of bowel wall thickening or dilatation. Appendix appears normal. Vascular/Lymphatic: No abdominal aorta or iliac aneurysm. Severe atherosclerotic plaque of the aorta and its branches. No abdominal, pelvic, or inguinal lymphadenopathy. Reproductive: Uterus and bilateral adnexa are unremarkable. Other: No intraperitoneal free fluid. No intraperitoneal free gas. No organized fluid collection. Musculoskeletal: No abdominal wall hernia or abnormality.  Diastasis rectus. No suspicious lytic or blastic osseous lesions. Age-indeterminate compression fracture of the T4 vertebral body with greater than 80% height loss. Chronic, slightly worsened, T11 compression fracture with greater than 80% height loss. Multilevel degenerative changes of the spine with endplate sclerosis at the L2-L3 and L3-L4 level, multilevel intervertebral disc space vacuum phenomenon, diffusely decreased bone density. Status post L4-S1 posterolateral fusion. IMPRESSION: 1. Multifocal pneumonia, likely atypical. Non-contrast chest CT at 3-6 months is recommended. If the nodules are stable at time of repeat CT, then future  CT at 18-24 months (from today's scan) is considered optional for low-risk patients, but is recommended for high-risk patients. This recommendation follows the consensus statement: Guidelines for Management of Incidental Pulmonary Nodules Detected on CT Images: From the Fleischner Society 2017; Radiology 2017; 284:228-243. 2. Interval further decrease of a 0.9 x 0.6 cm (from 1.7 x 2.2 cm) right upper lobe cavitary lesion that likely represents a resolving infection. 3. Nonspecific diffuse  distension of the small bowel with fluid. Limited evaluation on this noncontrast study. 4. Age-indeterminate compression fracture of the T4 vertebral body with greater than 80% height loss. Chronic, slightly worsened, T11 compression fracture with greater than 80% height loss. Diffusely decreased bone density in a multilevel degenerative changes status post L4-S1 posterolateral fusion. 5.  Aortic Atherosclerosis (ICD10-I70.0). 6. Atrophic right kidney. Electronically Signed   By: Iven Finn M.D.   On: 07/24/2021 15:41        Scheduled Meds:  arformoterol  15 mcg Nebulization BID   And   umeclidinium bromide  1 puff Inhalation Daily   atorvastatin  40 mg Oral Daily   diltiazem  120 mg Oral Daily   heparin  5,000 Units Subcutaneous Q8H   ipratropium-albuterol  3 mL Nebulization TID   lactulose  20 g Oral Once   latanoprost  1 drop Both Eyes QHS   mirtazapine  7.5 mg Oral QHS   pantoprazole  80 mg Oral Daily   polyethylene glycol  17 g Oral BID   rOPINIRole  2 mg Oral QHS   Continuous Infusions:  azithromycin Stopped (08/16/21 0128)   ceFEPime (MAXIPIME) IV     sodium bicarbonate 150 mEq in D5W infusion       LOS: 0 days    Time spent: icu 45 minutes    Sharen Hones, MD [facility]   To contact the attending provider between 7A-7P or the covering provider during after hours 7P-7A, please log into the web site [email or web address] and access using universal [place] password for that web site. If you do not have the password, please call the hospital operator.  08/16/2021, 9:13 AM

## 2021-08-16 NOTE — Progress Notes (Signed)
Secured chat hospitalist. Patient having wheezing and rhonchi. Breathing treatments ordered. Patient unable to lay flat and still for CT. Per MD hold CT scan of head. MD called son due to patients decline and tx to ICU. Continue to assess.

## 2021-08-16 NOTE — Progress Notes (Signed)
PHARMACY NOTE:  ANTIMICROBIAL RENAL DOSAGE ADJUSTMENT  Current antimicrobial regimen includes a mismatch between antimicrobial dosage and estimated renal function.  As per policy approved by the Pharmacy & Therapeutics and Medical Executive Committees, the antimicrobial dosage will be adjusted accordingly.  Current antimicrobial dosage:  cefepime 1 g q12h   Indication: sepsis  Renal Function:  Estimated Creatinine Clearance: 7.9 mL/min (A) (by C-G formula based on SCr of 3.45 mg/dL (H)). []      On intermittent HD, scheduled: []      On CRRT    Antimicrobial dosage has been changed to:  cefepime 1 g q24h  Additional comments:   Thank you for allowing pharmacy to be a part of this patient's care.  [place], [facility] 08/16/2021 9:11 AM

## 2021-08-16 NOTE — Progress Notes (Signed)
Secured message Hospitalist regarding patients repeat sodium of 122, Urine output 0. Per MD will consult nephrology. Continue to monitor.

## 2021-08-16 NOTE — Consult Note (Signed)
[facility]  CONSULT NOTE    Date: 08/16/2021                  Patient Name:  Suzanne Avila  MRN: [id number]  DOB: 11/28/1945  Age / Sex: 75 y.o., female         PCP: Jacky Kindle, FNP                 Service Requesting Consult: Dr. Thora Lance                 Reason for Consult: Acute kidney injury            History of Present Illness: Suzanne Avila admitted to [facility] on 09-07-2021. Patient admitted to diarrhea and poor PO intake. Patient recently discharged from SNF. Patient with confusion. Patient admitted with hypoxia. Now she is intubated and nephrology consulted for hyponatremia, acute kidney injury and metabolic acidosis.   Found to have sepsis with septic shock. Intubated, OG tube placed. Large amount of fecal like discharge removed.    Medications: Outpatient medications: Medications Prior to Admission  Medication Sig Dispense Refill Last Dose   albuterol (PROVENTIL) (2.5 MG/3ML) 0.083% nebulizer solution Take 3 mLs (2.5 mg total) by nebulization every 6 (six) hours as needed for wheezing or shortness of breath. 150 mL 1 Past Week   albuterol (VENTOLIN HFA) 108 (90 Base) MCG/ACT inhaler Inhale 2 puffs into the lungs every 6 (six) hours as needed for wheezing or shortness of breath. 18 g 6 Past Week   atorvastatin (LIPITOR) 40 MG tablet Take 1 tablet (40 mg total) by mouth daily. 90 tablet 3 08/14/2021   diltiazem (CARDIZEM) 120 MG tablet Take 1 tablet (120 mg total) by mouth daily. 90 tablet 2 08/14/2021   docusate sodium (COLACE) 100 MG capsule Take 1 capsule (100 mg total) by mouth 2 (two) times daily.   Past Week   dorzolamide (TRUSOPT) 2 % ophthalmic solution Place 1 drop into both eyes 2 (two) times daily. 10 mL 5 08/14/2021   latanoprost (XALATAN) 0.005 % ophthalmic solution Place 1 drop into both eyes at bedtime. 2.5 mL 3 08/14/2021   magnesium gluconate (MAGONATE) 500 MG tablet Take 500 mg by mouth daily.   08/14/2021   mirtazapine  (REMERON) 7.5 MG tablet Take 1 tablet (7.5 mg total) by mouth at bedtime. 90 tablet 2 08/14/2021   Multiple Vitamin (MULTI-VITAMIN) tablet Take 1 tablet by mouth daily.    08/14/2021   omeprazole (PRILOSEC) 40 MG capsule Take 1 capsule (40 mg total) by mouth daily. 90 capsule 3 08/14/2021   polyethylene glycol (MIRALAX / GLYCOLAX) 17 g packet Take 17 g by mouth daily as needed for mild constipation.   unknown   pregabalin (LYRICA) 150 MG capsule Take 1 capsule (150 mg total) by mouth every 8 (eight) hours. 270 capsule 1 08/14/2021   ramipril (ALTACE) 2.5 MG capsule Take 1 capsule (2.5 mg total) by mouth daily. 90 capsule 3 08/14/2021   rOPINIRole (REQUIP) 2 MG tablet Take 1 tablet (2 mg total) by mouth at bedtime.   08/14/2021   spironolactone (ALDACTONE) 25 MG tablet Take 1 tablet (25 mg total) by mouth daily. 90 tablet 2 08/14/2021   Tiotropium Bromide-Olodaterol (STIOLTO RESPIMAT) 2.5-2.5 MCG/ACT AERS Inhale 2 puffs into the lungs daily. 1 each 0 unknown   tizanidine (ZANAFLEX) 2 MG capsule Take 1 capsule (2 mg total) by mouth 3 (three) times daily as needed for muscle spasms. 90  capsule 1 08/14/2021   torsemide (DEMADEX) 20 MG tablet TAKE 2 TABLETS TWICE A DAY 360 tablet 1 08/14/2021   vitamin B-12 (CYANOCOBALAMIN) 500 MCG tablet Take 1 tablet (500 mcg total) by mouth daily. 90 tablet 3 08/14/2021   enoxaparin (LOVENOX) 40 MG/0.4ML injection Inject 0.4 mLs (40 mg total) into the skin daily for 14 days. 5.6 mL 0    HYDROcodone-acetaminophen (NORCO) 7.5-325 MG tablet Take 1 tablet by mouth every 6 (six) hours as needed for severe pain. Must last 30 days 120 tablet 0    [START ON 09/01/2021] HYDROcodone-acetaminophen (NORCO) 7.5-325 MG tablet Take 1 tablet by mouth every 6 (six) hours as needed for severe pain. Must last 30 days 120 tablet 0    traMADol (ULTRAM) 50 MG tablet Take 1 tablet (50 mg total) by mouth every 6 (six) hours as needed. 30 tablet 0     Current medications: Current  Facility-Administered Medications  Medication Dose Route Frequency Provider Last Rate Last Admin   0.9 %  sodium chloride infusion  250 mL Intravenous Continuous Graves, Rosalva Ferron, NP       acetaminophen (TYLENOL) tablet 650 mg  650 mg Oral Q6H PRN Cox, Amy N, DO   650 mg at 08/16/21 1610   Or   acetaminophen (TYLENOL) suppository 650 mg  650 mg Rectal Q6H PRN Cox, Amy N, DO       albuterol (PROVENTIL) (2.5 MG/3ML) 0.083% nebulizer solution 2.5 mg  2.5 mg Nebulization Q6H PRN Cox, Amy N, DO       arformoterol (BROVANA) nebulizer solution 15 mcg  15 mcg Nebulization BID Cox, Amy N, DO   15 mcg at 08/16/21 9604   And   umeclidinium bromide (INCRUSE ELLIPTA) 62.5 MCG/ACT 1 puff  1 puff Inhalation Daily Cox, Amy N, DO   1 puff at 08/16/21 0836   atorvastatin (LIPITOR) tablet 40 mg  40 mg Oral Daily Cox, Amy N, DO   40 mg at 08/16/21 0836   ceFEPIme (MAXIPIME) 1 g in sodium chloride 0.9 % 100 mL IVPB  1 g Intravenous Q24H Rauer, Samantha O, RPH       Chlorhexidine Gluconate Cloth 2 % PADS 6 each  6 each Topical Daily Marrion Coy, MD   6 each at 08/16/21 1130   docusate (COLACE) 50 MG/5ML liquid 100 mg  100 mg Per Tube BID Lianne Cure, NP       fentaNYL (SUBLIMAZE) bolus via infusion 25-100 mcg  25-100 mcg Intravenous Q15 min PRN Lianne Cure, NP       fentaNYL in NS (32mcg/ml) infusion-PREMIX  25-200 mcg/hr Intravenous Continuous Lianne Cure, NP 5 mL/hr at 08/16/21 1429 50 mcg/hr at 08/16/21 1429   heparin injection 5,000 Units  5,000 Units Subcutaneous Q8H Cox, Amy N, DO   5,000 Units at 08/16/21 1319   ipratropium-albuterol (DUONEB) 0.5-2.5 (3) MG/3ML nebulizer solution 3 mL  3 mL Nebulization TID Cox, Amy N, DO   3 mL at 08/16/21 1317   labetalol (NORMODYNE) injection 5 mg  5 mg Intravenous Q2H PRN Cox, Amy N, DO       latanoprost (XALATAN) 0.005 % ophthalmic solution 1 drop  1 drop Both Eyes QHS Cox, Amy N, DO       MEDLINE mouth rinse  15 mL Mouth Rinse BID Marrion Coy, MD    15 mL at 08/16/21 1130   metroNIDAZOLE (FLAGYL) IVPB 500 mg  500 mg Intravenous Q12H Marrion Coy, MD 100 mL/hr  at 08/16/21 1319 500 mg at 08/16/21 1319   nicotine (NICODERM CQ - dosed in mg/24 hours) patch 21 mg  21 mg Transdermal Daily PRN Cox, Amy N, DO       norepinephrine (LEVOPHED) 4mg  in premix infusion  2-10 mcg/min Intravenous Titrated Graves, , NP       ondansetron (ZOFRAN) tablet 4 mg  4 mg Oral Q6H PRN Cox, Amy N, DO       Or   ondansetron (ZOFRAN) injection 4 mg  4 mg Intravenous Q6H PRN Cox, Amy N, DO       pantoprazole (PROTONIX) injection 40 mg  40 mg Intravenous Q24H Graves, Dana E, NP       polyethylene glycol (MIRALAX / GLYCOLAX) packet 17 g  17 g Oral BID Cox, Amy N, DO   17 g at 08/16/21 0835   sodium bicarbonate 150 mEq in dextrose 5 % 1,150 mL infusion   Intravenous Continuous 08/18/21, MD 100 mL/hr at 08/16/21 1431 New Bag at 08/16/21 1431   sodium phosphate (FLEET) 7-19 GM/118ML enema 1 enema  1 enema Rectal Daily PRN Mansy, Jan A, MD       vancomycin variable dose per unstable renal function (pharmacist dosing)   Does not apply See admin instructions Rauer, Feb, RPH          Allergies: Allergies  Allergen Reactions   Codeine Itching   Cyclobenzaprine Itching   Penicillins     Itching without rash per patient.  Unable to say when it happened, other than "a long time ago"      Past Medical History: Past Medical History:  Diagnosis Date   Closed left arm fracture    COPD (chronic obstructive pulmonary disease) (HCC)    Dehydration 09/15/2020   Glaucoma    Hyperlipidemia    Hypertension    Lung mass      Past Surgical History: Past Surgical History:  Procedure Laterality Date   BACK SURGERY     CARPAL TUNNEL RELEASE Left    EYE SURGERY     FEMUR IM NAIL Right 07/21/2021   Procedure: INTRAMEDULLARY (IM) RETROGRADE FEMORAL NAILING;  Surgeon: 07/23/2021, MD;  Location: [facility] ORS;  Service: Orthopedics;  Laterality: Right;      Family History: Family History  Problem Relation Age of Onset   Cancer Mother    Cancer Father      Social History: Social History   Socioeconomic History   Marital status: Married    Spouse name: Not on file   Number of children: 2   Years of education: Not on file   Highest education level: 12th grade  Occupational History   Occupation: disabled   Occupation: retired  Tobacco Use   Smoking status: Every Day    Packs/day: 1.50    Years: 50.00    Pack years: 75.00    Types: Cigarettes   Smokeless tobacco: Never   Tobacco comments:    1PPD 07/07/2021  Vaping Use   Vaping Use: Former  Substance and Sexual Activity   Alcohol use: Yes    Alcohol/week: 35.0 standard drinks    Types: 35 Cans of beer per week    Comment: sytates she quit 09/23/2020   Drug use: No   Sexual activity: Not Currently  Other Topics Concern   Not on file  Social History Narrative   Not on file   Social Determinants of Health   Financial Resource Strain: Low Risk  Difficulty of Paying Living Expenses: Not hard at all  Food Insecurity: Not on file  Transportation Needs: Not on file  Physical Activity: Not on file  Stress: Not on file  Social Connections: Not on file  Intimate Partner Violence: Not on file     Review of Systems: Review of Systems  Unable to perform ROS: Critical illness   Vital Signs: Blood pressure 133/77, pulse (!) 110, temperature 98.1 F (36.7 C), temperature source Axillary, resp. rate (!) 21, height  (1.422 m), weight 40.7 kg, SpO2 100 %.  Weight trends: Filed Weights   07/26/2021 1250 08/16/21 1021  Weight: 40.4 kg 40.7 kg    Physical Exam: General: Critically ill  Head: ETT, OGT  Eyes: Anicteric  Neck: trachea midline  Lungs:  PRVC FiO2 50%  Heart: tachycardia  Abdomen:  Soft  Extremities:  no peripheral edema.  Neurologic: Intubated, sedated  Skin: No lesions  Access: none     Lab results: Basic Metabolic Panel: Recent Labs   Lab 08/14/2021 1248 08/16/21 0441 08/16/21 1226  NA 120* 122* 122*  K 5.1 5.3*  --   CL 84* 90*  --   CO2 16* 14*  --   GLUCOSE 74 74  --   BUN 89* 95*  --   CREATININE 3.94* 3.45*  --   CALCIUM 7.0* 6.6*  --     Liver Function Tests: Recent Labs  Lab 08/14/2021 1248  AST 136*  ALT 68*  ALKPHOS 111  BILITOT 0.8  PROT 6.3*  ALBUMIN 3.0*   No results for input(s): LIPASE, AMYLASE in the last 168 hours. No results for input(s): AMMONIA in the last 168 hours.  CBC: Recent Labs  Lab 08/13/2021 1248 08/16/21 0441  WBC 7.1 14.3*  HGB 11.3* 10.8*  HCT 32.8* 31.9*  MCV 83.7 81.8  PLT 229 210    Cardiac Enzymes: No results for input(s): CKTOTAL, CKMB, CKMBINDEX, TROPONINI in the last 168 hours.  BNP: Invalid input(s): POCBNP  CBG: Recent Labs  Lab 08/16/21 1024  GLUCAP 124*    Microbiology: Results for orders placed or performed during the hospital encounter of 08/02/2021  Resp Panel by RT-PCR (Flu A&B, Covid) Nasopharyngeal Swab     Status: None   Collection Time: 08/07/2021  2:22 PM   Specimen: Nasopharyngeal Swab; Nasopharyngeal(NP) swabs in vial transport medium  Result Value Ref Range Status   SARS Coronavirus 2 by RT PCR NEGATIVE NEGATIVE Final    Comment: (NOTE) SARS-CoV-2 target nucleic acids are NOT DETECTED.  The SARS-CoV-2 RNA is generally detectable in upper respiratory specimens during the acute phase of infection. The lowest concentration of SARS-CoV-2 viral copies this assay can detect is 138 copies/mL. A negative result does not preclude SARS-Cov-2 infection and should not be used as the sole basis for treatment or other patient management decisions. A negative result may occur with  improper specimen collection/handling, submission of specimen other than nasopharyngeal swab, presence of viral mutation(s) within the areas targeted by this assay, and inadequate number of viral copies(<138 copies/mL). A negative result must be combined  with clinical observations, patient history, and epidemiological information. The expected result is Negative.  Fact Sheet for Patients:  BloggerCourse.com  Fact Sheet for Healthcare Providers:  SeriousBroker.it  This test is no t yet approved or cleared by the [place] FDA and  has been authorized for detection and/or diagnosis of SARS-CoV-2 by FDA under an Emergency Use Authorization (EUA). This EUA will remain  in effect (meaning this  test can be used) for the duration of the COVID-19 declaration under Section 564(b)(1) of the Act, 21 U.S.C.section 360bbb-3(b)(1), unless the authorization is terminated  or revoked sooner.       Influenza A by PCR NEGATIVE NEGATIVE Final   Influenza B by PCR NEGATIVE NEGATIVE Final    Comment: (NOTE) The Xpert Xpress SARS-CoV-2/FLU/RSV plus assay is intended as an aid in the diagnosis of influenza from Nasopharyngeal swab specimens and should not be used as a sole basis for treatment. Nasal washings and aspirates are unacceptable for Xpert Xpress SARS-CoV-2/FLU/RSV testing.  Fact Sheet for Patients: BloggerCourse.com  Fact Sheet for Healthcare Providers: SeriousBroker.it  This test is not yet approved or cleared by the [place] FDA and has been authorized for detection and/or diagnosis of SARS-CoV-2 by FDA under an Emergency Use Authorization (EUA). This EUA will remain in effect (meaning this test can be used) for the duration of the COVID-19 declaration under Section 564(b)(1) of the Act, 21 U.S.C. section 360bbb-3(b)(1), unless the authorization is terminated or revoked.  Performed at [facility], [address] [place], [place] [address]   Blood Culture (routine x 2)     Status: None (Preliminary result)   Collection Time: 04-Sep-2021  3:47 PM   Specimen: BLOOD  Result Value Ref Range Status   Specimen  Description BLOOD LEFT ANTECUBITAL  Final   Special Requests   Final    BOTTLES DRAWN AEROBIC AND ANAEROBIC Blood Culture results may not be optimal due to an inadequate volume of blood received in culture bottles   Culture   Final    NO GROWTH < 24 HOURS Performed at [facility], [address], [place], [place] [address]    Report Status PENDING  Incomplete  Blood Culture (routine x 2)     Status: None (Preliminary result)   Collection Time: 09/04/2021  5:31 PM   Specimen: BLOOD  Result Value Ref Range Status   Specimen Description BLOOD RIGHT [facility]  Final   Special Requests   Final    BOTTLES DRAWN AEROBIC AND ANAEROBIC Blood Culture adequate volume   Culture   Final    NO GROWTH < 24 HOURS Performed at [facility], [address] [place], [place] [address]    Report Status PENDING  Incomplete  MRSA Next Gen by PCR, Nasal     Status: None   Collection Time: 08/16/21  9:34 AM   Specimen: Nasal Mucosa; Nasal Swab  Result Value Ref Range Status   MRSA by PCR Next Gen NOT DETECTED NOT DETECTED Final    Comment: (NOTE) The GeneXpert MRSA Assay (FDA approved for NASAL specimens only), is one component of a comprehensive MRSA colonization surveillance program. It is not intended to diagnose MRSA infection nor to guide or monitor treatment for MRSA infections. Test performance is not FDA approved in patients less than 42 years old. Performed at [facility], [address] [place], [place] [address]   MRSA Next Gen by PCR, Nasal     Status: None   Collection Time: 08/16/21 10:20 AM   Specimen: Nasal Mucosa; Nasal Swab  Result Value Ref Range Status   MRSA by PCR Next Gen NOT DETECTED NOT DETECTED Final    Comment: (NOTE) The GeneXpert MRSA Assay (FDA approved for NASAL specimens only), is one component of a comprehensive MRSA colonization surveillance program. It is not intended to diagnose MRSA infection nor to guide or monitor treatment for MRSA  infections. Test performance  is not FDA approved in patients less than 76 years old. Performed at [facility], [address] [place], [place] [address]     Coagulation Studies: Recent Labs    11-Sep-2021 1248  LABPROT 14.7  INR 1.2    Urinalysis: No results for input(s): COLORURINE, LABSPEC, PHURINE, GLUCOSEU, HGBUR, BILIRUBINUR, KETONESUR, PROTEINUR, UROBILINOGEN, NITRITE, LEUKOCYTESUR in the last 72 hours.  Invalid input(s): APPERANCEUR    Imaging: DG Chest Portable 1 View  Result Date: 09-11-21 CLINICAL DATA:  Breath and hypoxia. EXAM: PORTABLE CHEST 1 VIEW COMPARISON:  07/21/2021 FINDINGS: Heart size is normal. There are increased patchy infiltrates in the LOWER lobes bilaterally and the RIGHT lung apex. Findings are consistent with infectious/inflammatory process. Partially imaged small bowel loops show mildly distended loops in the UPPER abdomen. No evidence for free intraperitoneal air. IMPRESSION: 1. Multifocal pulmonary infiltrates. 2. Question of small bowel dilatation. Consider two-view abdomen if needed. Electronically Signed   By: Norva Pavlov M.D.   On: 09/11/2021 13:40   CT CHEST ABDOMEN PELVIS WO CONTRAST  Result Date: Sep 11, 2021 CLINICAL DATA:  Nonlocalized abdominal pain. Multifocal pneumonia on chest x-ray. EXAM: CT CHEST, ABDOMEN AND PELVIS WITHOUT CONTRAST TECHNIQUE: Multidetector CT imaging of the chest, abdomen and pelvis was performed following the standard protocol without IV contrast. COMPARISON:  Chest x-ray 09/11/2021, CT lumbar spine 06/15/2021, CT chest 12/22/2020, CT chest 07/19/2019 FINDINGS: CT CHEST FINDINGS Cardiovascular: Normal heart size. No significant pericardial effusion. The thoracic aorta is normal in caliber. Severe atherosclerotic plaque of the thoracic aorta. Three-vessel coronary artery calcifications. Mediastinum/Nodes: No enlarged mediastinal, hilar, or axillary lymph nodes. Thyroid gland, trachea, and esophagus  demonstrate no significant findings. Lungs/Pleura: Interval further decrease of a 0.9 x 0.6 cm (from 1.7 x 2.2 cm) right upper lobe cavitary lesion (4:43). Associated traction bronchiectasis. Interval development of scattered peribronchovascular patchy nodular-like consolidations and centrilobular nodules. No pleural effusion. No pneumothorax. Musculoskeletal: No chest wall abnormality. No suspicious lytic or blastic osseous lesions. No acute displaced fracture. Multilevel degenerative changes of the spine. CT ABDOMEN PELVIS FINDINGS Hepatobiliary: No focal liver abnormality. No definite gallstones, gallbladder wall thickening, or pericholecystic fluid. No biliary dilatation. Pancreas: No focal lesion. Normal pancreatic contour. No surrounding inflammatory changes. No main pancreatic ductal dilatation. Spleen: Normal in size without focal abnormality. Adrenals/Urinary Tract: No adrenal nodule bilaterally. Right renal atrophy. Left renal calcification likely vascular. No nephrolithiasis and no hydronephrosis. No definite contour-deforming renal mass. No ureterolithiasis or hydroureter. The urinary bladder is unremarkable. Stomach/Bowel: Stomach is within normal limits. The small bowel is distended with free fluid. No pneumatosis. No evidence of bowel wall thickening or dilatation. Appendix appears normal. Vascular/Lymphatic: No abdominal aorta or iliac aneurysm. Severe atherosclerotic plaque of the aorta and its branches. No abdominal, pelvic, or inguinal lymphadenopathy. Reproductive: Uterus and bilateral adnexa are unremarkable. Other: No intraperitoneal free fluid. No intraperitoneal free gas. No organized fluid collection. Musculoskeletal: No abdominal wall hernia or abnormality.  Diastasis rectus. No suspicious lytic or blastic osseous lesions. Age-indeterminate compression fracture of the T4 vertebral body with greater than 80% height loss. Chronic, slightly worsened, T11 compression fracture with greater than  80% height loss. Multilevel degenerative changes of the spine with endplate sclerosis at the L2-L3 and L3-L4 level, multilevel intervertebral disc space vacuum phenomenon, diffusely decreased bone density. Status post L4-S1 posterolateral fusion. IMPRESSION: 1. Multifocal pneumonia, likely atypical. Non-contrast chest CT at 3-6 months is recommended. If the nodules are stable at time of repeat CT, then future CT at 18-24 months (from today's  scan) is considered optional for low-risk patients, but is recommended for high-risk patients. This recommendation follows the consensus statement: Guidelines for Management of Incidental Pulmonary Nodules Detected on CT Images: From the Fleischner Society 2017; Radiology 2017; 284:228-243. 2. Interval further decrease of a 0.9 x 0.6 cm (from 1.7 x 2.2 cm) right upper lobe cavitary lesion that likely represents a resolving infection. 3. Nonspecific diffuse distension of the small bowel with fluid. Limited evaluation on this noncontrast study. 4. Age-indeterminate compression fracture of the T4 vertebral body with greater than 80% height loss. Chronic, slightly worsened, T11 compression fracture with greater than 80% height loss. Diffusely decreased bone density in a multilevel degenerative changes status post L4-S1 posterolateral fusion. 5.  Aortic Atherosclerosis (ICD10-I70.0). 6. Atrophic right kidney. Electronically Signed   By: Tish Frederickson M.D.   On: 08/21/2021 15:41     Assessment & Plan: Suzanne Avila is a 75 y.o. white female with COPD, hypertension, hyperlipidemia, glaucoma, diastolic congestive heart failure who was admitted to [facility] on 08/02/2021 for Abdominal distension [R14.0] Hypoxia [R09.02] Acute hypoxemic respiratory failure (HCC) [J96.01] Community acquired pneumonia, unspecified laterality [J18.9]   Acute renal failure: baseline creatinine of 0.81, normal GFR on 07/23/21. History normal ultrasound. With anion gap acidosis. Normal lactic acid  level.  - holding home dose of ramipril, spironolactone, and torsemide.  - Continue IV fluids: sodium bicarbonate.  - low threshold for dialysis.   Hyponatremia: acute on chronic.   Acute respiratory failure. Requiring intubation and mechanical ventilation.   Hypotension: secondary to septic shock. Placed on norepinephrine. holding diltiazem and as above: ramipril, sprionolactone, and torsemide. Empiric antibiotics.   LOS: 0 Maya Scholer 10/24/20222:33 PM

## 2021-08-16 NOTE — Progress Notes (Signed)
Pharmacy Antibiotic Note  Suzanne Avila is a 75 y.o. female admitted on 2021-09-10 with sepsis.  Pharmacy has been consulted for vancomycin dosing. Pt noted to have severe acute renal failure secondary to sepsis and dehydration.   Plan: Pt received vancomycin 1000 mg IV x1 loading dose. Due to renal function will continue with variable vancomycin dosing Will order vanc random with AM labs and dose based on levels  Monitor renal function daily  Follow up cultures   Weight: 40.4 kg (89 lb 1.1 oz)  Temp (24hrs), Avg:97.8 F (36.6 C), Min:97.3 F (36.3 C), Max:98.3 F (36.8 C)  Recent Labs  Lab 09/10/2021 1248 09/10/21 1422 2021-09-10 1728 08/16/21 0441  WBC 7.1  --   --  14.3*  CREATININE 3.94*  --   --  3.45*  LATICACIDVEN  --  2.4* 2.3*  --     Estimated Creatinine Clearance: 7.9 mL/min (A) (by C-G formula based on SCr of 3.45 mg/dL (H)).    Allergies  Allergen Reactions   Codeine Itching   Cyclobenzaprine Itching   Penicillins     Itching without rash per patient.  Unable to say when it happened, other than "a long time ago"    Antimicrobials this admission: 10/23 cefepime >>  10/23 vancomycin >>  10/23 azithromycin >>   Dose adjustments this admission: Cefepime q12h > q24h  Microbiology results: 10/23 BCx: NG <12h 10/24 MRSA PCR: sent  Thank you for allowing pharmacy to be a part of this patient's care.  Robyne Peers Kadie Balestrieri 08/16/2021 9:51 AM

## 2021-08-16 NOTE — Progress Notes (Signed)
Per Annabelle Harman NP will change levophed to neo. Heart rate into 170's. Awaiting for pharmacy to dispensing. Continue to monitor.

## 2021-08-16 NOTE — Telephone Encounter (Signed)
Caller/Agency: Kathrin Ruddy [facility] calling for medication management Callback Number: [phone] ok to leave verbal on vm Requesting OT/PT/Skilled Nursing/Social Work/Speech Therapy: Skilled Nursing Frequency: 1 w 1, 2 w 1, 1 w 2, 3 month 1, 2 prn

## 2021-08-16 NOTE — H&P (Signed)
NAME:  Suzanne Avila, MRN:  [id number], DOB:  Feb 25, 1946, LOS: 0 ADMISSION DATE:  07/30/2021, CONSULTATION DATE:  08/16/21 REFERRING MD:  Chipper Herb, CHIEF COMPLAINT:  AMS, decreased responsiveness, increased WOB   History of Present Illness:  75yF with PMH as noted below who presented to the ED for dyspnea on 10/23. She had recently been discharged to SNF following hospitalization 9/28-10/3 for right femoral fracture repair. Since leaving SNF she'd had about 10d of diarrhea as well as constipation.   She required 2L nasal cannula in setting of her pneumonia. She was started on vanc/cefepime and cultured, given 1.5L saline bolus, started on saline maintenance fluids and overnight started on bicarb infusion for severe metabolic acidosis. Primary team reached out today for increased work of breathing not long after starting bicarb gtt, decreased responsiveness, gurgling/concern for aspiration.  Pertinent  Medical History  GERD Smoking COPD RUL cavitary lesion Recent right femoral fracture pAF  Significant Hospital Events: Including procedures, antibiotic start and stop dates in addition to other pertinent events   10/23 admitted 10/24 intubated in setting encephalopathy and acute hypoxic respiratory failure with multifocal pneumonia and likely ongoing aspiration, metabolic acidosis  Interim History / Subjective:    Objective   Blood pressure 132/80, pulse (!) 104, temperature 98.1 F (36.7 C), temperature source Axillary, resp. rate 19, height 4\' 8"  (1.422 m), weight 40.7 kg, SpO2 97 %.        Intake/Output Summary (Last 24 hours) at 08/16/2021 1408 Last data filed at 08/16/2021 1010 Gross per 24 hour  Intake 1867.4 ml  Output 600 ml  Net 1267.4 ml   Filed Weights   08/20/2021 1250 08/16/21 1021  Weight: 40.4 kg 40.7 kg    Examination: General appearance: 75 y.o., female, groaning and slumped over on her side Eyes: PERRL HENT: NCAT; dry MM Lungs: mech breath sounds b/l,  equal chest rise CV: tachy, RR, no MRGs  Abdomen: distended, pretty taut, diffusely ttp, hypoactive BS Extremities: 1+ BLEedema, warm Skin: Normal temperature, turgor and texture; no rash Neuro: Does not follow commands, moving all ext equally.    Resolved Hospital Problem list     Assessment & Plan:   Acute hypoxic respiratory failure Multifocal pneumonia COPD - full vent support - RASS -2 - tracheal aspirate - vanc/cefepime/flagyl - bronchodilators - follow cultures and narrow as able  Acute encephalopathy Likely acute toxic metabolic in setting sepsis, acute renal failure. - f/u response to mgmt of sepsis, acute renal failure  Sepsis Multifocal pneumonia or possible intraabdominal source with diarrhea, abdominal distension and tenderness. - CT A/P w contrast - check c diff - cultures, vanc/cefepime/flagyl narrow as able  - repeat lactic   Hyponatremia: With history would favor hypovolemia. - urine lytes will be hard to interpret in setting of saline and bicarb infusions - reassess volume status following intubation and scans  Acute renal failure Anion and non-gap acidosis Mild hyperkalemia - place foley,  recheck BMP: if remains anuric or repeat electrolytes demonstrate urgent need for dialysis, consult nephro  pAF: - doesn't appear to be on AC at home - re-evaluate need for rate control following intubation after starting sedation  Cavitary lung lesion: - pulmonary follow up post hospitalization  Best Practice (right click and "Reselect all SmartList Selections" daily)   Diet/type: NPO DVT prophylaxis: prophylactic heparin  GI prophylaxis: PPI Lines: N/A Foley:  Yes, and it is still needed Code Status:  full code Last date of multidisciplinary goals of care discussion [Updated family by phone  today, in agreement with time-limited trial of mechanical ventilation and/or dialysis for a week before reassessing whether we're seeing clinical improvement.  Remains full code.]  Labs   CBC: Recent Labs  Lab 08/21/21 1248 08/16/21 0441  WBC 7.1 14.3*  HGB 11.3* 10.8*  HCT 32.8* 31.9*  MCV 83.7 81.8  PLT 229 210    Basic Metabolic Panel: Recent Labs  Lab 2021/08/21 1248 08/16/21 0441 08/16/21 1226  NA 120* 122* 122*  K 5.1 5.3*  --   CL 84* 90*  --   CO2 16* 14*  --   GLUCOSE 74 74  --   BUN 89* 95*  --   CREATININE 3.94* 3.45*  --   CALCIUM 7.0* 6.6*  --    GFR: Estimated Creatinine Clearance: 7.9 mL/min (A) (by C-G formula based on SCr of 3.45 mg/dL (H)). Recent Labs  Lab 2021/08/21 1248 21-Aug-2021 1422 2021-08-21 1728 08/16/21 0441  PROCALCITON  --   --  132.64 >150.00  WBC 7.1  --   --  14.3*  LATICACIDVEN  --  2.4* 2.3*  --     Liver Function Tests: Recent Labs  Lab 2021/08/21 1248  AST 136*  ALT 68*  ALKPHOS 111  BILITOT 0.8  PROT 6.3*  ALBUMIN 3.0*   No results for input(s): LIPASE, AMYLASE in the last 168 hours. No results for input(s): AMMONIA in the last 168 hours.  ABG    Component Value Date/Time   PHART 7.27 (L) 08/16/2021 0900   PCO2ART 22 (L) 08/16/2021 0900   PO2ART 114 (H) 08/16/2021 0900   HCO3 10.1 (L) 08/16/2021 0900   ACIDBASEDEF 15.1 (H) 08/16/2021 0900   O2SAT 97.8 08/16/2021 0900     Coagulation Profile: Recent Labs  Lab 21-Aug-2021 1248  INR 1.2    Cardiac Enzymes: No results for input(s): CKTOTAL, CKMB, CKMBINDEX, TROPONINI in the last 168 hours.  HbA1C: No results found for: HGBA1C  CBG: Recent Labs  Lab 08/16/21 1024  GLUCAP 124*    Review of Systems:   Unable to obtain in setting of encephalopathy  Past Medical History:  She,  has a past medical history of Closed left arm fracture, COPD (chronic obstructive pulmonary disease) (HCC), Dehydration (09/15/2020), Glaucoma, Hyperlipidemia, Hypertension, and Lung mass.   Surgical History:   Past Surgical History:  Procedure Laterality Date   BACK SURGERY     CARPAL TUNNEL RELEASE Left    EYE SURGERY     FEMUR IM  NAIL Right 07/21/2021   Procedure: INTRAMEDULLARY (IM) RETROGRADE FEMORAL NAILING;  Surgeon: Kennedy Bucker, MD;  Location: [facility] ORS;  Service: Orthopedics;  Laterality: Right;     Social History:   reports that she has been smoking cigarettes. She has a 75.00 pack-year smoking history. She has never used smokeless tobacco. She reports current alcohol use of about 35.0 standard drinks per week. She reports that she does not use drugs.   Family History:  Her family history includes Cancer in her father and mother.   Allergies Allergies  Allergen Reactions   Codeine Itching   Cyclobenzaprine Itching   Penicillins     Itching without rash per patient.  Unable to say when it happened, other than "a long time ago"     Home Medications  Prior to Admission medications   Medication Sig Start Date End Date Taking? Authorizing Provider  albuterol (PROVENTIL) (2.5 MG/3ML) 0.083% nebulizer solution Take 3 mLs (2.5 mg total) by nebulization every 6 (six) hours as needed for wheezing  or shortness of breath. 10/21/20  Yes Margaretann Loveless, PA-C  albuterol (VENTOLIN HFA) 108 (90 Base) MCG/ACT inhaler Inhale 2 puffs into the lungs every 6 (six) hours as needed for wheezing or shortness of breath. 11/18/20  Yes Salena Saner, MD  atorvastatin (LIPITOR) 40 MG tablet Take 1 tablet (40 mg total) by mouth daily. 04/03/21  Yes Malva Limes, MD  diltiazem (CARDIZEM) 120 MG tablet Take 1 tablet (120 mg total) by mouth daily. 04/03/21  Yes Malva Limes, MD  docusate sodium (COLACE) 100 MG capsule Take 1 capsule (100 mg total) by mouth 2 (two) times daily. 07/26/21  Yes Lurene Shadow, MD  dorzolamide (TRUSOPT) 2 % ophthalmic solution Place 1 drop into both eyes 2 (two) times daily. 04/01/21  Yes Maple Hudson., MD  latanoprost (XALATAN) 0.005 % ophthalmic solution Place 1 drop into both eyes at bedtime. 04/01/21  Yes Maple Hudson., MD  magnesium gluconate (MAGONATE) 500 MG tablet Take 500  mg by mouth daily.   Yes [provider]  mirtazapine (REMERON) 7.5 MG tablet Take 1 tablet (7.5 mg total) by mouth at bedtime. 04/03/21  Yes Malva Limes, MD  Multiple Vitamin (MULTI-VITAMIN) tablet Take 1 tablet by mouth daily.    Yes [provider]  omeprazole (PRILOSEC) 40 MG capsule Take 1 capsule (40 mg total) by mouth daily. 05/04/21  Yes Bacigalupo, Marzella Schlein, MD  polyethylene glycol (MIRALAX / GLYCOLAX) 17 g packet Take 17 g by mouth daily as needed for mild constipation. 07/26/21  Yes Lurene Shadow, MD  pregabalin (LYRICA) 150 MG capsule Take 1 capsule (150 mg total) by mouth every 8 (eight) hours. 04/03/21  Yes Malva Limes, MD  ramipril (ALTACE) 2.5 MG capsule Take 1 capsule (2.5 mg total) by mouth daily. 10/21/20 10/21/21 Yes Burnette, Alessandra Bevels, PA-C  rOPINIRole (REQUIP) 2 MG tablet Take 1 tablet (2 mg total) by mouth at bedtime. 07/26/21  Yes Lurene Shadow, MD  spironolactone (ALDACTONE) 25 MG tablet Take 1 tablet (25 mg total) by mouth daily. 04/03/21  Yes Malva Limes, MD  Tiotropium Bromide-Olodaterol (STIOLTO RESPIMAT) 2.5-2.5 MCG/ACT AERS Inhale 2 puffs into the lungs daily. 07/07/21  Yes Salena Saner, MD  tizanidine (ZANAFLEX) 2 MG capsule Take 1 capsule (2 mg total) by mouth 3 (three) times daily as needed for muscle spasms. 05/04/21  Yes Erasmo Downer, MD  torsemide (DEMADEX) 20 MG tablet TAKE 2 TABLETS TWICE A DAY 06/18/21  Yes Bacigalupo, Marzella Schlein, MD  vitamin B-12 (CYANOCOBALAMIN) 500 MCG tablet Take 1 tablet (500 mcg total) by mouth daily. 10/21/20  Yes Margaretann Loveless, PA-C  enoxaparin (LOVENOX) 40 MG/0.4ML injection Inject 0.4 mLs (40 mg total) into the skin daily for 14 days. 07/23/21 08/06/21  Evon Slack, PA-C  HYDROcodone-acetaminophen (NORCO) 7.5-325 MG tablet Take 1 tablet by mouth every 6 (six) hours as needed for severe pain. Must last 30 days 08/02/21 09/01/21  Delano Metz, MD  HYDROcodone-acetaminophen (NORCO)  7.5-325 MG tablet Take 1 tablet by mouth every 6 (six) hours as needed for severe pain. Must last 30 days 09/01/21 10/01/21  Delano Metz, MD  traMADol (ULTRAM) 50 MG tablet Take 1 tablet (50 mg total) by mouth every 6 (six) hours as needed. 07/23/21   Evon Slack, PA-C     Critical care time: 38 minutes

## 2021-08-16 NOTE — Progress Notes (Signed)
Patient transferred to ICU 11 for higher level of care. Transferred in trendelenburg position due to hypotension. Patient LOC decreased from this AM.

## 2021-08-17 ENCOUNTER — Ambulatory Visit: Payer: Self-pay | Admitting: Family Medicine

## 2021-08-17 DIAGNOSIS — A419 Sepsis, unspecified organism: Secondary | ICD-10-CM | POA: Diagnosis not present

## 2021-08-17 DIAGNOSIS — I480 Paroxysmal atrial fibrillation: Secondary | ICD-10-CM

## 2021-08-17 DIAGNOSIS — E440 Moderate protein-calorie malnutrition: Secondary | ICD-10-CM | POA: Insufficient documentation

## 2021-08-17 DIAGNOSIS — N179 Acute kidney failure, unspecified: Secondary | ICD-10-CM

## 2021-08-17 DIAGNOSIS — J984 Other disorders of lung: Secondary | ICD-10-CM

## 2021-08-17 DIAGNOSIS — J9601 Acute respiratory failure with hypoxia: Secondary | ICD-10-CM

## 2021-08-17 LAB — HEPATITIS B SURFACE ANTIGEN: Hepatitis B Surface Ag: NONREACTIVE

## 2021-08-17 LAB — CBC WITH DIFFERENTIAL/PLATELET
Abs Immature Granulocytes: 0.07 10*3/uL (ref 0.00–0.07)
Basophils Absolute: 0 10*3/uL (ref 0.0–0.1)
Basophils Relative: 0 %
Eosinophils Absolute: 0 10*3/uL (ref 0.0–0.5)
Eosinophils Relative: 0 %
HCT: 24.1 % — ABNORMAL LOW (ref 36.0–46.0)
Hemoglobin: 9 g/dL — ABNORMAL LOW (ref 12.0–15.0)
Immature Granulocytes: 0 %
Lymphocytes Relative: 1 %
Lymphs Abs: 0.2 10*3/uL — ABNORMAL LOW (ref 0.7–4.0)
MCH: 27.5 pg (ref 26.0–34.0)
MCHC: 36.6 g/dL — ABNORMAL HIGH (ref 30.0–36.0)
MCV: 77.5 fL — ABNORMAL LOW (ref 80.0–100.0)
Monocytes Absolute: 0.7 10*3/uL (ref 0.1–1.0)
Monocytes Relative: 3 %
Neutro Abs: 18.6 10*3/uL — ABNORMAL HIGH (ref 1.7–7.7)
Neutrophils Relative %: 96 %
Platelets: 144 10*3/uL — ABNORMAL LOW (ref 150–400)
RBC: 3.11 MIL/uL — ABNORMAL LOW (ref 3.87–5.11)
RDW: 17.9 % — ABNORMAL HIGH (ref 11.5–15.5)
WBC: 19.6 10*3/uL — ABNORMAL HIGH (ref 4.0–10.5)
nRBC: 0 % (ref 0.0–0.2)

## 2021-08-17 LAB — BASIC METABOLIC PANEL
Anion gap: 13 (ref 5–15)
BUN: 80 mg/dL — ABNORMAL HIGH (ref 8–23)
CO2: 23 mmol/L (ref 22–32)
Calcium: 6.5 mg/dL — ABNORMAL LOW (ref 8.9–10.3)
Chloride: 93 mmol/L — ABNORMAL LOW (ref 98–111)
Creatinine, Ser: 2.12 mg/dL — ABNORMAL HIGH (ref 0.44–1.00)
GFR, Estimated: 24 mL/min — ABNORMAL LOW (ref >60)
Glucose, Bld: 224 mg/dL — ABNORMAL HIGH (ref 70–99)
Potassium: 3.5 mmol/L (ref 3.5–5.1)
Sodium: 129 mmol/L — ABNORMAL LOW (ref 135–145)

## 2021-08-17 LAB — URINE CULTURE: Culture: NO GROWTH

## 2021-08-17 LAB — GLUCOSE, CAPILLARY
Glucose-Capillary: 218 mg/dL — ABNORMAL HIGH (ref 70–99)
Glucose-Capillary: 222 mg/dL — ABNORMAL HIGH (ref 70–99)
Glucose-Capillary: 70 mg/dL (ref 70–99)
Glucose-Capillary: 82 mg/dL (ref 70–99)
Glucose-Capillary: 85 mg/dL (ref 70–99)
Glucose-Capillary: 86 mg/dL (ref 70–99)

## 2021-08-17 LAB — HEPATITIS C ANTIBODY: HCV Ab: NONREACTIVE

## 2021-08-17 LAB — MAGNESIUM: Magnesium: 2.2 mg/dL (ref 1.7–2.4)

## 2021-08-17 LAB — HEPATITIS B SURFACE ANTIBODY,QUALITATIVE: Hep B S Ab: NONREACTIVE

## 2021-08-17 LAB — HEPATITIS B CORE ANTIBODY, TOTAL: Hep B Core Total Ab: NONREACTIVE

## 2021-08-17 LAB — PHOSPHORUS: Phosphorus: 6.4 mg/dL — ABNORMAL HIGH (ref 2.5–4.6)

## 2021-08-17 LAB — VANCOMYCIN, RANDOM: Vancomycin Rm: 13

## 2021-08-17 LAB — SODIUM: Sodium: 124 mmol/L — ABNORMAL LOW (ref 135–145)

## 2021-08-17 LAB — PROCALCITONIN: Procalcitonin: 107.4 ng/mL

## 2021-08-17 LAB — HEPATITIS B CORE ANTIBODY, IGM: Hep B C IgM: NONREACTIVE

## 2021-08-17 MED ORDER — POTASSIUM CHLORIDE 10 MEQ/100ML IV SOLN
10.0000 meq | INTRAVENOUS | Status: AC
  Administered 2021-08-17 (×3): 10 meq via INTRAVENOUS
  Filled 2021-08-17: qty 100
  Filled 2021-08-17: qty 200
  Filled 2021-08-17 (×3): qty 100

## 2021-08-17 MED ORDER — VITAL HIGH PROTEIN PO LIQD: 1000.0000 mL | ORAL | Status: DC

## 2021-08-17 MED ORDER — LACTATED RINGERS IV SOLN: INTRAVENOUS | Status: DC

## 2021-08-17 MED ORDER — SODIUM CHLORIDE 0.9 % IV SOLN
25.0000 ug/min | INTRAVENOUS | Status: DC
  Filled 2021-08-17: qty 2

## 2021-08-17 MED ORDER — INSULIN ASPART 100 UNIT/ML IJ SOLN
0.0000 [IU] | INTRAMUSCULAR | Status: DC
  Administered 2021-08-17: 5 [IU] via SUBCUTANEOUS
  Administered 2021-08-18 – 2021-08-19 (×3): 2 [IU] via SUBCUTANEOUS
  Administered 2021-08-19 (×2): 3 [IU] via SUBCUTANEOUS
  Administered 2021-08-20: 2 [IU] via SUBCUTANEOUS
  Administered 2021-08-20: 3 [IU] via SUBCUTANEOUS
  Administered 2021-08-20 – 2021-08-21 (×3): 2 [IU] via SUBCUTANEOUS
  Administered 2021-08-21 (×2): 3 [IU] via SUBCUTANEOUS
  Filled 2021-08-17 (×11): qty 1

## 2021-08-17 MED ORDER — VITAL AF 1.2 CAL PO LIQD
1000.0000 mL | ORAL | Status: DC
  Administered 2021-08-17: 1000 mL

## 2021-08-17 MED ORDER — SODIUM CHLORIDE 0.9 % IV SOLN
3.0000 g | Freq: Two times a day (BID) | INTRAVENOUS | Status: DC
  Administered 2021-08-17 – 2021-08-19 (×6): 3 g via INTRAVENOUS
  Filled 2021-08-17: qty 8
  Filled 2021-08-17: qty 3
  Filled 2021-08-17: qty 8
  Filled 2021-08-17: qty 3
  Filled 2021-08-17: qty 8
  Filled 2021-08-17 (×3): qty 3
  Filled 2021-08-17: qty 8

## 2021-08-17 MED ORDER — DEXTROSE 10 % IV SOLN: INTRAVENOUS | Status: DC

## 2021-08-17 MED ORDER — ORAL CARE MOUTH RINSE
15.0000 mL | OROMUCOSAL | Status: DC
  Administered 2021-08-17 – 2021-08-21 (×41): 15 mL via OROMUCOSAL

## 2021-08-17 MED ORDER — CHLORHEXIDINE GLUCONATE 0.12% ORAL RINSE (MEDLINE KIT)
15.0000 mL | Freq: Two times a day (BID) | OROMUCOSAL | Status: DC
  Administered 2021-08-17 – 2021-08-26 (×18): 15 mL via OROMUCOSAL

## 2021-08-17 NOTE — Progress Notes (Signed)
Entered patient's room at shift change and noted tan vomit coming from the patient's right nostril, from the side of her mouth & a puddle of vomit on patient's gown with the appearance of tube feeds. Her tube feeds were stopped and OG tube connected to LWS. DR. Arsenio Loader @ [facility] notified. An order for D10 initiated as ordered due to several blood sugars ranging from 70-86 over the last 8 hours

## 2021-08-17 NOTE — Telephone Encounter (Signed)
Verbal orders given. KW 

## 2021-08-17 NOTE — Progress Notes (Signed)
[facility]  ROUNDING NOTE   Subjective:   UOP 1266mL  LR at 163mL/hr  Weaned off phenylephrine.   Afebrile.   Objective:  Vital signs in last 24 hours:  Temp:  [97.9 F (36.6 C)-98.9 F (37.2 C)] 98.9 F (37.2 C) (10/25 0700) Pulse Rate:  [32-111] 98 (10/25 0800) Resp:  [15-27] 17 (10/25 0800) BP: (73-141)/(41-88) 110/66 (10/25 0800) SpO2:  [93 %-100 %] 98 % (10/25 0811) FiO2 (%):  [40 %-50 %] 40 % (10/25 0811) Weight:  [40.7 kg] 40.7 kg (10/24 1021)  Weight change: 0.3 kg Filed Weights   08/14/2021 1250 08/16/21 1021  Weight: 40.4 kg 40.7 kg    Intake/Output: I/O last 3 completed shifts: In: 3301.1 [I.V.:2704.4; IV Piggyback:596.7] Out: 2536 [Urine:1825]   Intake/Output this shift:  Total I/O In: -  Out: 100 [Urine:100]  Physical Exam: General: Critically ill  Head: ETT, OGT  Eyes: Anicteric, PERRL  Neck:  trachea midline  Lungs:  PRVC FiO2 40%  Heart: Regular rate and rhythm  Abdomen:  Soft, nontender, +bowel sounds  Extremities:  No peripheral edema.  Neurologic: Intubated, sedated  Skin: No lesions       Basic Metabolic Panel: Recent Labs  Lab 08/17/2021 1248 08/16/21 0441 08/16/21 1226 08/16/21 1450 08/16/21 1812 08/16/21 1944 08/16/21 2346 08/17/21 0635  NA 120* 122* 122* 124* 124*  --  124* 129*  K 5.1 5.3*  --  4.8  --   --   --  3.5  CL 84* 90*  --  93*  --   --   --  93*  CO2 16* 14*  --  16*  --   --   --  23  GLUCOSE 74 74  --  125*  --   --   --  224*  BUN 89* 95*  --  97*  --   --   --  80*  CREATININE 3.94* 3.45*  --  3.34*  --   --   --  2.12*  CALCIUM 7.0* 6.6*  --  6.9*  --  6.8*  --  6.5*  MG  --   --   --  1.8  --   --   --  2.2  PHOS  --   --   --  9.3*  --   --   --  6.4*    Liver Function Tests: Recent Labs  Lab 07/31/2021 1248  AST 136*  ALT 68*  ALKPHOS 111  BILITOT 0.8  PROT 6.3*  ALBUMIN 3.0*   No results for input(s): LIPASE, AMYLASE in the last 168 hours. No results for input(s): AMMONIA  in the last 168 hours.  CBC: Recent Labs  Lab 08/18/2021 1248 08/16/21 0441 08/17/21 0635  WBC 7.1 14.3* 19.6*  NEUTROABS  --   --  18.6*  HGB 11.3* 10.8* 9.0*  HCT 32.8* 31.9* 24.1*  MCV 83.7 81.8 77.5*  PLT 229 210 144*    Cardiac Enzymes: No results for input(s): CKTOTAL, CKMB, CKMBINDEX, TROPONINI in the last 168 hours.  BNP: Invalid input(s): POCBNP  CBG: Recent Labs  Lab 08/16/21 1024 08/16/21 1512 08/16/21 1929 08/17/21 0351 08/17/21 0719  GLUCAP 124* 123* 209* 218* 222*    Microbiology: Results for orders placed or performed during the hospital encounter of 07/25/2021  Resp Panel by RT-PCR (Flu A&B, Covid) Nasopharyngeal Swab     Status: None   Collection Time: 08/02/2021  2:22 PM   Specimen: Nasopharyngeal Swab; Nasopharyngeal(NP) swabs in  vial transport medium  Result Value Ref Range Status   SARS Coronavirus 2 by RT PCR NEGATIVE NEGATIVE Final    Comment: (NOTE) SARS-CoV-2 target nucleic acids are NOT DETECTED.  The SARS-CoV-2 RNA is generally detectable in upper respiratory specimens during the acute phase of infection. The lowest concentration of SARS-CoV-2 viral copies this assay can detect is 138 copies/mL. A negative result does not preclude SARS-Cov-2 infection and should not be used as the sole basis for treatment or other patient management decisions. A negative result may occur with  improper specimen collection/handling, submission of specimen other than nasopharyngeal swab, presence of viral mutation(s) within the areas targeted by this assay, and inadequate number of viral copies(<138 copies/mL). A negative result must be combined with clinical observations, patient history, and epidemiological information. The expected result is Negative.  Fact Sheet for Patients:  EntrepreneurPulse.com.au  Fact Sheet for Healthcare Providers:  IncredibleEmployment.be  This test is no t yet approved or cleared by the  [place] FDA and  has been authorized for detection and/or diagnosis of SARS-CoV-2 by FDA under an Emergency Use Authorization (EUA). This EUA will remain  in effect (meaning this test can be used) for the duration of the COVID-19 declaration under Section 564(b)(1) of the Act, 21 U.S.C.section 360bbb-3(b)(1), unless the authorization is terminated  or revoked sooner.       Influenza A by PCR NEGATIVE NEGATIVE Final   Influenza B by PCR NEGATIVE NEGATIVE Final    Comment: (NOTE) The Xpert Xpress SARS-CoV-2/FLU/RSV plus assay is intended as an aid in the diagnosis of influenza from Nasopharyngeal swab specimens and should not be used as a sole basis for treatment. Nasal washings and aspirates are unacceptable for Xpert Xpress SARS-CoV-2/FLU/RSV testing.  Fact Sheet for Patients: EntrepreneurPulse.com.au  Fact Sheet for Healthcare Providers: IncredibleEmployment.be  This test is not yet approved or cleared by the [place] FDA and has been authorized for detection and/or diagnosis of SARS-CoV-2 by FDA under an Emergency Use Authorization (EUA). This EUA will remain in effect (meaning this test can be used) for the duration of the COVID-19 declaration under Section 564(b)(1) of the Act, 21 U.S.C. section 360bbb-3(b)(1), unless the authorization is terminated or revoked.  Performed at [facility], [address] [place], [place] [address]   Blood Culture (routine x 2)     Status: None (Preliminary result)   Collection Time: 07/29/2021  3:47 PM   Specimen: BLOOD  Result Value Ref Range Status   Specimen Description BLOOD LEFT ANTECUBITAL  Final   Special Requests   Final    BOTTLES DRAWN AEROBIC AND ANAEROBIC Blood Culture results may not be optimal due to an inadequate volume of blood received in culture bottles   Culture   Final    NO GROWTH 2 DAYS Performed at [facility], [address] [place], [place]  [address]    Report Status PENDING  Incomplete  Blood Culture (routine x 2)     Status: None (Preliminary result)   Collection Time: 08/01/2021  5:31 PM   Specimen: BLOOD  Result Value Ref Range Status   Specimen Description BLOOD RIGHT [facility]  Final   Special Requests   Final    BOTTLES DRAWN AEROBIC AND ANAEROBIC Blood Culture adequate volume   Culture   Final    NO GROWTH 2 DAYS Performed at [facility], [address], [place],  [address]    Report Status PENDING  Incomplete  MRSA Next Gen by PCR,  Nasal     Status: None   Collection Time: 08/16/21  9:34 AM   Specimen: Nasal Mucosa; Nasal Swab  Result Value Ref Range Status   MRSA by PCR Next Gen NOT DETECTED NOT DETECTED Final    Comment: (NOTE) The GeneXpert MRSA Assay (FDA approved for NASAL specimens only), is one component of a comprehensive MRSA colonization surveillance program. It is not intended to diagnose MRSA infection nor to guide or monitor treatment for MRSA infections. Test performance is not FDA approved in patients less than 76 years old. Performed at [facility], [address] [place], [place] [address]   MRSA Next Gen by PCR, Nasal     Status: None   Collection Time: 08/16/21 10:20 AM   Specimen: Nasal Mucosa; Nasal Swab  Result Value Ref Range Status   MRSA by PCR Next Gen NOT DETECTED NOT DETECTED Final    Comment: (NOTE) The GeneXpert MRSA Assay (FDA approved for NASAL specimens only), is one component of a comprehensive MRSA colonization surveillance program. It is not intended to diagnose MRSA infection nor to guide or monitor treatment for MRSA infections. Test performance is not FDA approved in patients less than 3 years old. Performed at [facility], [address] [place], [place] [address]     Coagulation Studies: Recent Labs    08/09/2021 1248  LABPROT 14.7  INR 1.2    Urinalysis: Recent Labs    08/16/21 1653  COLORURINE YELLOW*  LABSPEC 1.012   PHURINE 5.0  GLUCOSEU NEGATIVE  HGBUR LARGE*  BILIRUBINUR NEGATIVE  KETONESUR NEGATIVE  PROTEINUR 30*  NITRITE NEGATIVE  LEUKOCYTESUR NEGATIVE      Imaging: DG Abd 1 View  Result Date: 08/16/2021 CLINICAL DATA:  Encounter for OG tube placement EXAM: ABDOMEN - 1 VIEW COMPARISON:  CT 08/15/2009 FINDINGS: NG tube with tip just beyond the GE junction. Side port is above the GE junction. Persistent dilated loops of small bowel centrally in the abdomen measuring up to 3.8 cm. Small a gas the rectum. IMPRESSION: 1. NG tube with tip just beyond the GE junction. 2. Persistent dilated loops of small bowel centrally. Electronically Signed   By: Suzy Bouchard M.D.   On: 08/16/2021 15:35   US RENAL  Result Date: 08/16/2021 CLINICAL DATA:  Renal failure EXAM: RENAL / URINARY TRACT ULTRASOUND COMPLETE COMPARISON:  CT 08/20/2021 FINDINGS: Right Kidney: Renal measurements: 5.8 x 2.2 x 2.5 cm = volume: 16.3 mL. Atrophic RIGHT kidney. No hydronephrosis increased cortical echogenicity Left Kidney: Renal measurements: 10.0 x 5.4 x 5.8 cm = volume: 160 mL. Echogenicity within normal limits. No mass or hydronephrosis visualized. Bladder: Foley catheter in bladder Other: Intubated patient.  Exam performed in ICU IMPRESSION: 1. Atrophic echogenic RIGHT kidney. 2. Normal LEFT kidney. 3. No hydronephrosis. 4. Foley catheter in bladder. Electronically Signed   By: Suzy Bouchard M.D.   On: 08/16/2021 17:57   DG Chest Port 1 View  Result Date: 08/16/2021 CLINICAL DATA:  Endotracheal tube present Z97.8 (ICD-10-CM) EXAM: PORTABLE CHEST 1 VIEW COMPARISON:  August 15, 2021. FINDINGS: Increased patchy interstitial and airspace opacities throughout both lungs. Endotracheal tube tip approximately 8 mm above the carina. Gastric tube courses below diaphragm with the tip likely in the stomach. Side port is likely above the GE junction. Similar enlargement the cardiac silhouette. No visible pleural effusions or  pneumothorax on this semi erect radiograph. Please see concurrent abdominal radiographs for abdominal evaluation. IMPRESSION: 1. Endotracheal tube tip approximately 8 mm above the  carina. Recommend retraction. 2. Gastric tube courses below the diaphragm with the tip likely in the stomach. The side port is above the GE junction. If intragastric positioning of the side port is desired, recommend advancement. 3. Increased patchy interstitial and airspace opacities throughout both lungs, concerning for multifocal pneumonia better evaluated on recent CT chest. 4. Please see concurrent abdominal radiographs for abdominal evaluation. These results will be called to the ordering clinician or representative by the Radiologist Assistant, and communication documented in the PACS or [employer]. Electronically Signed   By: Margaretha Sheffield M.D.   On: 08/16/2021 15:42   DG Chest Portable 1 View  Result Date: 08/10/2021 CLINICAL DATA:  Breath and hypoxia. EXAM: PORTABLE CHEST 1 VIEW COMPARISON:  07/21/2021 FINDINGS: Heart size is normal. There are increased patchy infiltrates in the LOWER lobes bilaterally and the RIGHT lung apex. Findings are consistent with infectious/inflammatory process. Partially imaged small bowel loops show mildly distended loops in the UPPER abdomen. No evidence for free intraperitoneal air. IMPRESSION: 1. Multifocal pulmonary infiltrates. 2. Question of small bowel dilatation. Consider two-view abdomen if needed. Electronically Signed   By: Nolon Nations M.D.   On: 07/28/2021 13:40   CT CHEST ABDOMEN PELVIS WO CONTRAST  Result Date: 07/24/2021 CLINICAL DATA:  Nonlocalized abdominal pain. Multifocal pneumonia on chest x-ray. EXAM: CT CHEST, ABDOMEN AND PELVIS WITHOUT CONTRAST TECHNIQUE: Multidetector CT imaging of the chest, abdomen and pelvis was performed following the standard protocol without IV contrast. COMPARISON:  Chest x-ray 08/20/2021, CT lumbar spine 06/15/2021, CT chest  12/22/2020, CT chest 07/19/2019 FINDINGS: CT CHEST FINDINGS Cardiovascular: Normal heart size. No significant pericardial effusion. The thoracic aorta is normal in caliber. Severe atherosclerotic plaque of the thoracic aorta. Three-vessel coronary artery calcifications. Mediastinum/Nodes: No enlarged mediastinal, hilar, or axillary lymph nodes. Thyroid gland, trachea, and esophagus demonstrate no significant findings. Lungs/Pleura: Interval further decrease of a 0.9 x 0.6 cm (from 1.7 x 2.2 cm) right upper lobe cavitary lesion (4:43). Associated traction bronchiectasis. Interval development of scattered peribronchovascular patchy nodular-like consolidations and centrilobular nodules. No pleural effusion. No pneumothorax. Musculoskeletal: No chest wall abnormality. No suspicious lytic or blastic osseous lesions. No acute displaced fracture. Multilevel degenerative changes of the spine. CT ABDOMEN PELVIS FINDINGS Hepatobiliary: No focal liver abnormality. No definite gallstones, gallbladder wall thickening, or pericholecystic fluid. No biliary dilatation. Pancreas: No focal lesion. Normal pancreatic contour. No surrounding inflammatory changes. No main pancreatic ductal dilatation. Spleen: Normal in size without focal abnormality. Adrenals/Urinary Tract: No adrenal nodule bilaterally. Right renal atrophy. Left renal calcification likely vascular. No nephrolithiasis and no hydronephrosis. No definite contour-deforming renal mass. No ureterolithiasis or hydroureter. The urinary bladder is unremarkable. Stomach/Bowel: Stomach is within normal limits. The small bowel is distended with free fluid. No pneumatosis. No evidence of bowel wall thickening or dilatation. Appendix appears normal. Vascular/Lymphatic: No abdominal aorta or iliac aneurysm. Severe atherosclerotic plaque of the aorta and its branches. No abdominal, pelvic, or inguinal lymphadenopathy. Reproductive: Uterus and bilateral adnexa are unremarkable. Other:  No intraperitoneal free fluid. No intraperitoneal free gas. No organized fluid collection. Musculoskeletal: No abdominal wall hernia or abnormality.  Diastasis rectus. No suspicious lytic or blastic osseous lesions. Age-indeterminate compression fracture of the T4 vertebral body with greater than 80% height loss. Chronic, slightly worsened, T11 compression fracture with greater than 80% height loss. Multilevel degenerative changes of the spine with endplate sclerosis at the L2-L3 and L3-L4 level, multilevel intervertebral disc space vacuum phenomenon, diffusely decreased bone density. Status post L4-S1 posterolateral fusion. IMPRESSION:  1. Multifocal pneumonia, likely atypical. Non-contrast chest CT at 3-6 months is recommended. If the nodules are stable at time of repeat CT, then future CT at 18-24 months (from today's scan) is considered optional for low-risk patients, but is recommended for high-risk patients. This recommendation follows the consensus statement: Guidelines for Management of Incidental Pulmonary Nodules Detected on CT Images: From the Fleischner Society 2017; Radiology 2017; 284:228-243. 2. Interval further decrease of a 0.9 x 0.6 cm (from 1.7 x 2.2 cm) right upper lobe cavitary lesion that likely represents a resolving infection. 3. Nonspecific diffuse distension of the small bowel with fluid. Limited evaluation on this noncontrast study. 4. Age-indeterminate compression fracture of the T4 vertebral body with greater than 80% height loss. Chronic, slightly worsened, T11 compression fracture with greater than 80% height loss. Diffusely decreased bone density in a multilevel degenerative changes status post L4-S1 posterolateral fusion. 5.  Aortic Atherosclerosis (ICD10-I70.0). 6. Atrophic right kidney. Electronically Signed   By: Iven Finn M.D.   On: 08/08/2021 15:41     Medications:    sodium chloride     ampicillin-sulbactam (UNASYN) IV 3 g (08/17/21 0916)   fentaNYL infusion  INTRAVENOUS Stopped (08/17/21 0145)   lactated ringers 100 mL/hr at 08/17/21 0801   norepinephrine (LEVOPHED) Adult infusion     phenylephrine (NEO-SYNEPHRINE) Adult infusion 100 mcg/min (08/17/21 0746)   potassium chloride      arformoterol  15 mcg Nebulization BID   And   umeclidinium bromide  1 puff Inhalation Daily   atorvastatin  40 mg Oral Daily   chlorhexidine gluconate (MEDLINE KIT)  15 mL Mouth Rinse BID   Chlorhexidine Gluconate Cloth  6 each Topical Daily   docusate  100 mg Per Tube BID   heparin  5,000 Units Subcutaneous Q8H   insulin aspart  0-15 Units Subcutaneous Q4H   latanoprost  1 drop Both Eyes QHS   levalbuterol  0.63 mg Nebulization Q6H   mouth rinse  15 mL Mouth Rinse 10 times per day   pantoprazole (PROTONIX) IV  40 mg Intravenous Q24H   polyethylene glycol  17 g Oral BID   acetaminophen **OR** acetaminophen, albuterol, fentaNYL, midazolam, midazolam, nicotine, ondansetron **OR** ondansetron (ZOFRAN) IV  Assessment/ Plan:  [name] is a 75 y.o. white female with COPD, hypertension, hyperlipidemia, glaucoma, diastolic congestive heart failure who was admitted to [facility] on 08/01/2021 for Abdominal distension [R14.0] Hypoxia [R09.02] Acute hypoxemic respiratory failure ([place]) [J96.01] Community acquired pneumonia, unspecified laterality [J18.9]   Acute renal failure with acute anion gap acidosis: Secondary to prerenal azotemia. baseline creatinine of 0.81, normal GFR on 07/23/21. History of bland urinalysis. Renal ultrasound with right atrophic kidney. Nonoliguric urine output. Creatinine improving. Acidosis resolving.  - holding home dose of ramipril, spironolactone, and torsemide.  - Continue IV fluids: LR at 178mL/hr   Hyponatremia: acute on chronic. Improving with IV fluids.    Acute respiratory failure. Requiring intubation and mechanical ventilation.    Hypotension: secondary to septic shock. Weaned off norepinephrine and phenylephrine. Holding  diltiazem and as above: ramipril, sprionolactone, and torsemide. Empiric antibiotics with Unasyn. Blood cultures without growth.   Hypokalemia: Secondary to GI losses. IV replacement ordered.    LOS: 1 Suzanne Avila 10/25/20229:37 AM

## 2021-08-17 NOTE — Progress Notes (Signed)
NAME:  Suzanne Avila, MRN:  [id number], DOB:  04/19/1946, LOS: 1 ADMISSION DATE:  08/18/2021, CONSULTATION DATE:  08/16/21 REFERRING MD:  Chipper Herb, CHIEF COMPLAINT:  AMS, decreased responsiveness, increased WOB   History of Present Illness:  75yF with PMH as noted below who presented to the ED for dyspnea on 10/23. She had recently been discharged to SNF following hospitalization 9/28-10/3 for right femoral fracture repair. Since leaving SNF she'd had about 10d of diarrhea as well as constipation.   She required 2L nasal cannula in setting of her pneumonia. She was started on vanc/cefepime and cultured, given 1.5L saline bolus, started on saline maintenance fluids and overnight started on bicarb infusion for severe metabolic acidosis. Primary team reached out today for increased work of breathing not long after starting bicarb gtt, decreased responsiveness, gurgling/concern for aspiration.  Pertinent  Medical History  GERD Smoking COPD RUL cavitary lesion Recent right femoral fracture pAF  Significant Hospital Events: Including procedures, antibiotic start and stop dates in addition to other pertinent events   10/23 admitted 10/24 intubated in setting encephalopathy and acute hypoxic respiratory failure with multifocal pneumonia and likely ongoing aspiration, metabolic acidosis  Interim History / Subjective:  Patient pH has improved, hyperkalemia corrected.  Still remains encephalopathic  Objective   Blood pressure (!) 90/50, pulse 87, temperature 98.7 F (37.1 C), temperature source Oral, resp. rate (!) 22, height 4\' 8"  (1.422 m), weight 40.7 kg, SpO2 98 %.    Vent Mode: PRVC FiO2 (%):  [30 %-50 %] 30 % Set Rate:  [22 bmp] 22 bmp Vt Set:  [400 mL] 400 mL PEEP:  [5 cmH20] 5 cmH20 Pressure Support:  [8 cmH20] 8 cmH20 Plateau Pressure:  [20 cmH20-23 cmH20] 20 cmH20   Intake/Output Summary (Last 24 hours) at 08/17/2021 1542 Last data filed at 08/17/2021 1500 Gross per 24 hour   Intake 2689.85 ml  Output 1350 ml  Net 1339.85 ml   Filed Weights   08-18-21 1250 08/16/21 1021  Weight: 40.4 kg 40.7 kg    Examination:   Physical exam: General: Crtitically ill-appearing elderly female, orally intubated HEENT: [place]/AT, eyes anicteric.  ETT and OGT in place Neuro: Closed, not following commands, spontaneous moving all 4 EXTR Chest: Coarse breath sounds, no wheezes or rhonchi Heart: Regular rate and rhythm, no murmurs or gallops Abdomen: Soft, nontender, nondistended, bowel sounds present Skin: No rash  Resolved Hospital Problem list     Assessment & Plan:   Acute hypoxic respiratory failure Multifocal pneumonia COPD without exacerbation Sepsis due to multifocal pneumonia Continue lung protective ventilation Continue sedation with RASS goal -1--2 Respiratory culture is growing gram-positive and gram-negative rods Antibiotics switched to IV Unasyn SBT as tolerated Continue IV fluid Trend lactate  Acute septic/metabolic encephalopathy Patient mental status remain poor Closely monitor Avoid deep sedation  Hyponatremia hypokalemia Closely monitor electrolytes and supplement  Acute kidney injury, likely due to ATN Anion and non-gap acidosis Mild hyperkalemia, resolved Patient is making good amount of urine Still she looks dehydrated We will give her 1 L of IV fluid bolus Continue IV fluid resuscitation Serum creatinine is trending down Anion gap acidosis has resolved Stop bicarbonate infusion  Paroxysmal atrial fibrillation Patient is in sinus rhythm with controlled rate Closely monitor  Hyperglycemia Patient was on bicarbonate infusion with D5W Will check A1c Continue sliding scale with goal 140-180  Cavitary lung lesion: Chest CT showed improvement in cavitary lesion Pulmonary follow up post hospitalization  Best Practice (right click and "Reselect all SmartList  Selections" daily)   Diet/type: Tube feeds DVT prophylaxis:  prophylactic heparin  GI prophylaxis: PPI Lines: N/A Foley:  Yes, and it is still needed Code Status:  full code Last date of multidisciplinary goals of care discussion [10/24 updated family by phone today, in agreement with time-limited trial of mechanical ventilation and/or dialysis for a week before reassessing whether we're seeing clinical improvement. Remains full code.]  Labs   CBC: Recent Labs  Lab 08/06/2021 1248 08/16/21 0441 08/17/21 0635  WBC 7.1 14.3* 19.6*  NEUTROABS  --   --  18.6*  HGB 11.3* 10.8* 9.0*  HCT 32.8* 31.9* 24.1*  MCV 83.7 81.8 77.5*  PLT 229 210 144*    Basic Metabolic Panel: Recent Labs  Lab 08/10/2021 1248 08/16/21 0441 08/16/21 1226 08/16/21 1450 08/16/21 1812 08/16/21 1944 08/16/21 2346 08/17/21 0635  NA 120* 122* 122* 124* 124*  --  124* 129*  K 5.1 5.3*  --  4.8  --   --   --  3.5  CL 84* 90*  --  93*  --   --   --  93*  CO2 16* 14*  --  16*  --   --   --  23  GLUCOSE 74 74  --  125*  --   --   --  224*  BUN 89* 95*  --  97*  --   --   --  80*  CREATININE 3.94* 3.45*  --  3.34*  --   --   --  2.12*  CALCIUM 7.0* 6.6*  --  6.9*  --  6.8*  --  6.5*  MG  --   --   --  1.8  --   --   --  2.2  PHOS  --   --   --  9.3*  --   --   --  6.4*   GFR: Estimated Creatinine Clearance: 12.9 mL/min (A) (by C-G formula based on SCr of 2.12 mg/dL (H)). Recent Labs  Lab 07/30/2021 1248 08/03/2021 1422 07/24/2021 1728 08/16/21 0441 08/16/21 1427 08/17/21 0635  PROCALCITON  --   --  132.64 >150.00  --  107.40  WBC 7.1  --   --  14.3*  --  19.6*  LATICACIDVEN  --  2.4* 2.3*  --  1.4  --     Liver Function Tests: Recent Labs  Lab 08/07/2021 1248  AST 136*  ALT 68*  ALKPHOS 111  BILITOT 0.8  PROT 6.3*  ALBUMIN 3.0*   No results for input(s): LIPASE, AMYLASE in the last 168 hours. No results for input(s): AMMONIA in the last 168 hours.  ABG    Component Value Date/Time   PHART 7.30 (L) 08/16/2021 2000   PCO2ART 34 08/16/2021 2000   PO2ART 151  (H) 08/16/2021 2000   HCO3 16.7 (L) 08/16/2021 2000   ACIDBASEDEF 8.9 (H) 08/16/2021 2000   O2SAT 99.1 08/16/2021 2000     Coagulation Profile: Recent Labs  Lab 07/27/2021 1248  INR 1.2    Cardiac Enzymes: No results for input(s): CKTOTAL, CKMB, CKMBINDEX, TROPONINI in the last 168 hours.  HbA1C: No results found for: HGBA1C  CBG: Recent Labs  Lab 08/16/21 1929 08/17/21 0351 08/17/21 0719 08/17/21 1115 08/17/21 1529  GLUCAP 209* 218* 222* 86 70    Total critical care time: 37 minutes  Performed by: Cheri Fowler   Critical care time was exclusive of separately billable procedures and treating other patients.   Critical care was necessary  to treat or prevent imminent or life-threatening deterioration.   Critical care was time spent personally by me on the following activities: development of treatment plan with patient and/or surrogate as well as nursing, discussions with consultants, evaluation of patient's response to treatment, examination of patient, obtaining history from patient or surrogate, ordering and performing treatments and interventions, ordering and review of laboratory studies, ordering and review of radiographic studies, pulse oximetry and re-evaluation of patient's condition.   Cheri Fowler MD [facility] See Amion for pager If no response to pager, please call [phone] until 7pm After 7pm, Please call E-link [phone]

## 2021-08-17 NOTE — Progress Notes (Signed)
Initial Nutrition Assessment  DOCUMENTATION CODES:   Non-severe (moderate) malnutrition in context of chronic illness  INTERVENTION:   Initiate Vital AF 1.2 @ 35 ml/hr via OGT (840 ml daily)  30 ml free water flush every 4 hours  Tube feeding regimen provides 1008 kcal (100% of needs), 63 grams of protein, and 681 ml of H2O. Total free water: 861 ml daily  NUTRITION DIAGNOSIS:   Moderate Malnutrition related to chronic illness (COPD) as evidenced by mild fat depletion, mild muscle depletion, moderate muscle depletion.  GOAL:   Patient will meet greater than or equal to 90% of their needs  MONITOR:   Vent status, Labs, Weight trends, TF tolerance, Skin, I & O's  REASON FOR ASSESSMENT:   Ventilator    ASSESSMENT:   75yF with PMH as noted below who presented to the ED for dyspnea on 10/23. She had recently been discharged to SNF following hospitalization 9/28-10/3 for right femoral fracture repair. Since leaving SNF she'd had about 10d of diarrhea as well as constipation.  Pt admitted with respiratory failure, PNA, COPD, and encephalopathy.   10/24- intubated  Patient is currently intubated on ventilator support MV: 8.7 L/min Temp (24hrs), Avg:98.5 F (36.9 C), Min:97.9 F (36.6 C), Max:98.9 F (37.2 C)  Reviewed I/O's: +1.5 L x 24 hours and +2.7 L since admission  UOP: 1.2 L x 24 hours   Case discussed with MD, RN, and during ICU rounds.   Per RN, pt is very agitated but has calmed down with medications. She was unable to follow commands on wake-up assessment today.   Pt with OGT placed to low, intermittent suction. Noted minimal output in container at visit.   Case discussed with MD, who gave RD permission to start feeds.   Reviewed wt hx; pt has experienced a 1.7% wt loss over the past 3 months, which is not significant for time frame.   Medications reviewed and include colace, miralax, lactated ringers infusion @ 100 ml/hr, and neo-synephrine.   Labs  reviewed: Na: 129, CBGS: 218-222 (inpatient orders for glycemic control are 0-15 units insulin aspart every 4 hours).    NUTRITION - FOCUSED PHYSICAL EXAM:  Flowsheet Row Most Recent Value  Orbital Region Mild depletion  Upper Arm Region Mild depletion  Thoracic and Lumbar Region No depletion  Buccal Region No depletion  Temple Region Mild depletion  Clavicle Bone Region Mild depletion  Clavicle and Acromion Bone Region Mild depletion  Scapular Bone Region Moderate depletion  Dorsal Hand Mild depletion  Patellar Region No depletion  Anterior Thigh Region No depletion  Posterior Calf Region No depletion  Edema (RD Assessment) Mild  Hair Reviewed  Eyes Reviewed  Mouth Reviewed  Skin Reviewed  Nails Reviewed       Diet Order:   Diet Order     None       EDUCATION NEEDS:   Not appropriate for education at this time  Skin:  Skin Assessment: Skin Integrity Issues: Skin Integrity Issues:: Stage I, Incisions Stage I: medial buttocks Incisions: closed rt thigh  Last BM:  Unknown  Height:   Ht Readings from Last 1 Encounters:  08/16/21 4\' 8"  (1.422 m)    Weight:   Wt Readings from Last 1 Encounters:  08/16/21 40.7 kg    Ideal Body Weight:  42.4 kg  BMI:  Body mass index is 20.12 kg/m.  Estimated Nutritional Needs:   Kcal:  1001  Protein:  55-70 grams  Fluid:  > 1 L  Loistine Chance, RD, LDN, [place] Registered Dietitian II Certified Diabetes Care and Education Specialist Please refer to [facility] for RD and/or RD on-call/weekend/after hours pager

## 2021-08-17 NOTE — Progress Notes (Signed)
eLink Physician-Brief Progress Note Patient Name: Suzanne Avila DOB: 02-18-1946 MRN: [id number]   Date of Service  08/17/2021  HPI/Events of Note  Patient vomited and nursing has turned off tube feeds and placed gastric tube to LIS. Last several blood glucose determinations = 86 --> 70 --> 82.  eICU Interventions  Plan: D10W IV infusion to run at 40 mL/hour.     Intervention Category Major Interventions: Other:  Lenell Antu 08/17/2021, 8:18 PM

## 2021-08-17 NOTE — Consult Note (Signed)
Pharmacy Antibiotic Note  Suzanne Avila is a 75 y.o. female with medical history including chronic pain syndrome, HTN, HLD, paroxysmal Afib, HFpEF, GERD admitted on 08/02/2021 with pneumonia. Pharmacy has been consulted for Unasyn dosing.  Patient initially admitted to general medicine floor where she subsequently decompensated on 10/24. She was transferred to the ICU where she was ultimately intubated. She was started on broad spectrum antibiotics with cefepime + vancomycin + metronidazole.   Today, 08/17/21  --Remains intubated, sedated, and on MV --Remains on vasopressors --AKI resolving --Afebrile, WBC trending up, PCT profoundly elevated but trending down today --Cultures in process --Antibiotics narrowed to Unasyn  Plan:  Unasyn 3 g IV q12h  Height: 4\' 8"  (142.2 cm) Weight: 40.7 kg (89 lb 11.6 oz) IBW/kg (Calculated) : 36.3  Temp (24hrs), Avg:98.3 F (36.8 C), Min:97.9 F (36.6 C), Max:98.9 F (37.2 C)  Recent Labs  Lab 07/26/2021 1248 08/01/2021 1422 08/10/2021 1728 08/16/21 0441 08/16/21 1427 08/16/21 1450 08/17/21 0635  WBC 7.1  --   --  14.3*  --   --  19.6*  CREATININE 3.94*  --   --  3.45*  --  3.34* 2.12*  LATICACIDVEN  --  2.4* 2.3*  --  1.4  --   --   VANCORANDOM  --   --   --   --   --   --  13    Estimated Creatinine Clearance: 12.9 mL/min (A) (by C-G formula based on SCr of 2.12 mg/dL (H)).    Allergies  Allergen Reactions   Codeine Itching   Cyclobenzaprine Itching   Penicillins     Itching without rash per patient.  Unable to say when it happened, other than "a long time ago"    Antimicrobials this admission: Vancomycin 10/23 x 1 Cefepime 10/23 >> 10/24 Azithromycin 10/24 x 1 Metronidazole 10/24 >> 10/24 Unasyn 10/25 >>   Dose adjustments this admission: N/A  Microbiology results: 10/23 BCx: NGTD 10/24 UCx: pending  10/24 MRSA PCR: (-) 10/25 Sputum: pending   Thank you for allowing pharmacy to be a part of this patient's  care.  11/25 08/17/2021 12:11 PM

## 2021-08-18 ENCOUNTER — Inpatient Hospital Stay: Payer: Medicare Other

## 2021-08-18 ENCOUNTER — Encounter: Payer: Self-pay | Admitting: Internal Medicine

## 2021-08-18 DIAGNOSIS — E8721 Acute metabolic acidosis: Secondary | ICD-10-CM

## 2021-08-18 DIAGNOSIS — R6521 Severe sepsis with septic shock: Secondary | ICD-10-CM | POA: Diagnosis not present

## 2021-08-18 DIAGNOSIS — A419 Sepsis, unspecified organism: Secondary | ICD-10-CM | POA: Diagnosis not present

## 2021-08-18 LAB — PROTEIN ELECTRO, RANDOM URINE
Albumin ELP, Urine: 40.1 %
Alpha-1-Globulin, U: 3.9 %
Alpha-2-Globulin, U: 16 %
Beta Globulin, U: 21.9 %
Gamma Globulin, U: 18.2 %
M Component, Ur: 9 % — ABNORMAL HIGH
Total Protein, Urine: 47 mg/dL

## 2021-08-18 LAB — GLUCOSE, CAPILLARY
Glucose-Capillary: 111 mg/dL — ABNORMAL HIGH (ref 70–99)
Glucose-Capillary: 114 mg/dL — ABNORMAL HIGH (ref 70–99)
Glucose-Capillary: 116 mg/dL — ABNORMAL HIGH (ref 70–99)
Glucose-Capillary: 129 mg/dL — ABNORMAL HIGH (ref 70–99)
Glucose-Capillary: 130 mg/dL — ABNORMAL HIGH (ref 70–99)
Glucose-Capillary: 67 mg/dL — ABNORMAL LOW (ref 70–99)
Glucose-Capillary: 88 mg/dL (ref 70–99)

## 2021-08-18 LAB — BASIC METABOLIC PANEL
Anion gap: 10 (ref 5–15)
BUN: 62 mg/dL — ABNORMAL HIGH (ref 8–23)
CO2: 22 mmol/L (ref 22–32)
Calcium: 7.1 mg/dL — ABNORMAL LOW (ref 8.9–10.3)
Chloride: 101 mmol/L (ref 98–111)
Creatinine, Ser: 1.28 mg/dL — ABNORMAL HIGH (ref 0.44–1.00)
GFR, Estimated: 43 mL/min — ABNORMAL LOW (ref >60)
Glucose, Bld: 122 mg/dL — ABNORMAL HIGH (ref 70–99)
Potassium: 3.6 mmol/L (ref 3.5–5.1)
Sodium: 133 mmol/L — ABNORMAL LOW (ref 135–145)

## 2021-08-18 LAB — CBC
HCT: 26.3 % — ABNORMAL LOW (ref 36.0–46.0)
Hemoglobin: 9.4 g/dL — ABNORMAL LOW (ref 12.0–15.0)
MCH: 27.6 pg (ref 26.0–34.0)
MCHC: 35.7 g/dL (ref 30.0–36.0)
MCV: 77.4 fL — ABNORMAL LOW (ref 80.0–100.0)
Platelets: 125 10*3/uL — ABNORMAL LOW (ref 150–400)
RBC: 3.4 MIL/uL — ABNORMAL LOW (ref 3.87–5.11)
RDW: 18.7 % — ABNORMAL HIGH (ref 11.5–15.5)
WBC: 22.5 10*3/uL — ABNORMAL HIGH (ref 4.0–10.5)
nRBC: 0 % (ref 0.0–0.2)

## 2021-08-18 LAB — KAPPA/LAMBDA LIGHT CHAINS
Kappa free light chain: 30.5 mg/L — ABNORMAL HIGH (ref 3.3–19.4)
Kappa, lambda light chain ratio: 1.18 (ref 0.26–1.65)
Lambda free light chains: 25.8 mg/L (ref 5.7–26.3)

## 2021-08-18 LAB — HEMOGLOBIN A1C
Hgb A1c MFr Bld: 5.6 % (ref 4.8–5.6)
Mean Plasma Glucose: 114.02 mg/dL

## 2021-08-18 LAB — PHOSPHORUS: Phosphorus: 4.5 mg/dL (ref 2.5–4.6)

## 2021-08-18 LAB — MAGNESIUM: Magnesium: 1.9 mg/dL (ref 1.7–2.4)

## 2021-08-18 MED ORDER — DEXTROSE 50 % IV SOLN
12.5000 g | INTRAVENOUS | Status: AC
  Administered 2021-08-18: 12.5 g via INTRAVENOUS
  Filled 2021-08-18: qty 50

## 2021-08-18 MED ORDER — POTASSIUM CHLORIDE 20 MEQ PO PACK
40.0000 meq | PACK | Freq: Once | ORAL | Status: AC
  Administered 2021-08-18: 40 meq
  Filled 2021-08-18: qty 2

## 2021-08-18 MED ORDER — DOCUSATE SODIUM 50 MG/5ML PO LIQD: 100.0000 mg | Freq: Two times a day (BID) | ORAL | Status: DC

## 2021-08-18 MED ORDER — POLYETHYLENE GLYCOL 3350 17 G PO PACK
17.0000 g | PACK | Freq: Every day | ORAL | Status: DC
  Administered 2021-08-18: 17 g
  Filled 2021-08-18: qty 1

## 2021-08-18 MED ORDER — DEXMEDETOMIDINE HCL IN NACL 400 MCG/100ML IV SOLN
0.0000 ug/kg/h | INTRAVENOUS | Status: DC
  Administered 2021-08-18 – 2021-08-19 (×2): 0.4 ug/kg/h via INTRAVENOUS
  Administered 2021-08-20: 0.6 ug/kg/h via INTRAVENOUS
  Administered 2021-08-20: 1 ug/kg/h via INTRAVENOUS
  Administered 2021-08-21: 1.2 ug/kg/h via INTRAVENOUS
  Filled 2021-08-18 (×6): qty 100

## 2021-08-18 MED ORDER — MAGNESIUM SULFATE 2 GM/50ML IV SOLN
2.0000 g | Freq: Once | INTRAVENOUS | Status: AC
  Administered 2021-08-18: 2 g via INTRAVENOUS
  Filled 2021-08-18: qty 50

## 2021-08-18 MED ORDER — IOHEXOL 300 MG/ML  SOLN
75.0000 mL | Freq: Once | INTRAMUSCULAR | Status: AC | PRN
  Administered 2021-08-18: 75 mL via INTRAVENOUS

## 2021-08-18 NOTE — Progress Notes (Signed)
Nutrition Follow-up  DOCUMENTATION CODES:   Non-severe (moderate) malnutrition in context of chronic illness  INTERVENTION:   If tube feeds initiated, recommend:  Vital 1.2@40ml /hr- Initiate at 33ml/hr and increase by 92ml/hr q12 hours until goal rate is reached.   Free water flushes 46ml q4 hours to maintain tube patency   Regimen provides 1152kcal/day, 72g/day protein and 950ml/day free water  Liquid MVI daily via tube   Juven Fruit Punch BID via tube, each serving provides 95kcal and 2.5g of protein (amino acids glutamine and arginine)  Pt at high refeed risk; recommend monitor potassium, magnesium and phosphorus labs daily until stable  NUTRITION DIAGNOSIS:   Moderate Malnutrition related to chronic illness (COPD) as evidenced by mild fat depletion, mild muscle depletion, moderate muscle depletion.  GOAL:   Provide needs based on ASPEN/SCCM guidelines  MONITOR:   Vent status, Labs, Weight trends, Skin, I & O's  ASSESSMENT:   75 y/o female with h/o chronic pain syndrome with opioid use, HTN, HLD, AF, COPD, depression, lung mass and recent hip fracture s/p repair 9/28 who is admitted with sepsis and PNA  Pt sedated and ventilated. OGT in place with side port noted to be above the GE junction; RN notified. Pt noted to have vomiting after medications. Tube feeds being held. Pt with abdominal distension. Pt with c/o abdominal pain on admission. KUB from 10/24 reporting dilated small bowel loops. Plan is for possible abdominal CT scan today. Pt is having BMs. Will plan to start tube feeds once appropriate. May need to consider TPN if unable to initiate tube feeds in the next 24-48 hours. Pt is likely at refeed risk.   Medications reviewed and include: colace, heparin, insulin, protonix, unasyn, 10% dextrose @40ml /hr, LRS @100ml /hr, phenylephrine  Labs reviewed: Na 133(L), K 3.6 wnl, BUN 62(H), creat 1.28(H), P 4.5 wnl, Mg 1.9 wnl Wbc- 22.5(H), Hgb 9.4(L), Hct 26.3(L) Cbgs-  111, 130, 67, 114 x 24 hrs  Patient is currently intubated on ventilator support MV: 8.1 L/min Temp (24hrs), Avg:98.2 F (36.8 C), Min:97.6 F (36.4 C), Max:99.3 F (37.4 C)  Propofol: none   MAP- >40mmHg  UOP-  Diet Order:   Diet Order     None      EDUCATION NEEDS:   Not appropriate for education at this time  Skin:  Skin Assessment: Skin Integrity Issues: Skin Integrity Issues:: Stage I, Incisions Stage I: medial buttocks Incisions: closed rt thigh  Last BM:  10/26- type 7  Height:   Ht Readings from Last 1 Encounters:  08/16/21 4\' 8"  (1.422 m)    Weight:   Wt Readings from Last 1 Encounters:  08/18/21 45.9 kg    Ideal Body Weight:  42.4 kg  BMI:  Body mass index is 22.69 kg/m.  Estimated Nutritional Needs:   Kcal:  1066kcal/day  Protein:  70-80g/day  Fluid:  > 1.1-1.4 L  08/18/21 MS, RD, LDN Please refer to [facility] for RD and/or RD on-call/weekend/after hours pager

## 2021-08-18 NOTE — TOC Initial Note (Signed)
Transition of Care [facility]) - Initial/Assessment Note    Patient Details  Name: Suzanne Avila MRN: [id number] Date of Birth: [birth year]  Transition of [facility] CM/SW Contact:    Marina Goodell Phone Number: [phone] 08/18/2021, 9:08 AM  Clinical Narrative:                  Patient presents to [facility] from home due to respiratory distress and abd pain for the "last couple hours." Patient recently discharged from a SNF due to right femoral fracture.  Patient is independent with ADLs.  Main contact is Nysia, Dell (Spouse) [phone]. Patient intubated and sedated. Patient vomited last last night and feeds where turned off.  Expected Discharge Plan: Skilled Nursing Facility Barriers to Discharge: Continued Medical Work up   Patient Goals and CMS Choice        Expected Discharge Plan and Services Expected Discharge Plan: Skilled Nursing Facility In-house Referral: Clinical Social Work   Post Acute Care Choice: Home Health, Durable Medical Equipment Living arrangements for the past 2 months: Single Family Home                                      Prior Living Arrangements/Services Living arrangements for the past 2 months: Single Family Home Lives with:: Spouse Chenel, Wernli (Spouse)   [phone] (Home Phone)) Patient language and need for interpreter reviewed:: Yes Do you feel safe going back to the place where you live?: Yes      Need for Family Participation in Patient Care: Yes (Comment) Care giver support system in place?: Yes (comment) Current home services: DME, Home OT, Home PT Criminal Activity/Legal Involvement Pertinent to Current Situation/Hospitalization: No - Comment as needed  Activities of Daily Living Home Assistive Devices/Equipment: Dentures (specify type), Walker (specify type) ADL Screening (condition at time of admission) Patient's cognitive ability adequate to safely complete daily activities?: No Is the patient deaf or  have difficulty hearing?: No Does the patient have difficulty seeing, even when wearing glasses/contacts?: No Does the patient have difficulty concentrating, remembering, or making decisions?: Yes Patient able to express need for assistance with ADLs?: No Does the patient have difficulty dressing or bathing?: Yes Independently performs ADLs?: No Communication: Independent Dressing (OT): Needs assistance Grooming: Independent with device (comment) Feeding: Independent with device (comment) Bathing: Needs assistance Toileting: Needs assistance In/Out Bed: Needs assistance Walks in Home: Independent with device (comment) Does the patient have difficulty walking or climbing stairs?: Yes Weakness of Legs: Both Weakness of Arms/Hands: Both  Permission Sought/Granted Permission sought to share information with : Family Supports Permission granted to share information with : Yes, Verbal Permission Granted  Share Information with NAME: Allan, Bacigalupi (Spouse)   [phone] (Home Phone)           Emotional Assessment Appearance:: Appears stated age Attitude/Demeanor/Rapport: Unable to Assess Affect (typically observed): Unable to Assess   Alcohol / Substance Use: Not Applicable Psych Involvement: No (comment)  Admission diagnosis:  Abdominal distension [R14.0] Hypoxia [R09.02] Acute hypoxemic respiratory failure (HCC) [J96.01] Community acquired pneumonia, unspecified laterality [J18.9] Patient Active Problem List   Diagnosis Date Noted   Malnutrition of moderate degree 08/17/2021   Pressure injury of skin 08/16/2021   Acute hypoxemic respiratory failure (HCC) 07/30/2021   Severe sepsis with septic shock (HCC) 08/06/2021   Femur fracture, right (HCC) 07/21/2021   Fall at home, initial encounter    AKI (acute kidney injury) (  HCC)    Depression    Abnormal CT scan, lumbar spine 07/06/2021   Lumbar foraminal stenosis (Bilateral: L2-3 and L3-4) (L>R) 07/06/2021   Lumbosacral  lateral recess stenosis (Bilateral: L3-4) (L>R) 07/06/2021   Chronic hip pain (Bilateral) (L>R) 06/22/2021   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 06/22/2021   Lumbosacral radiculopathy at L2 (Left) 06/01/2021   Impaired ambulation 06/01/2021   Underweight 05/14/2021   Chronic use of opiate for therapeutic purpose 01/26/2021   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   Cavitary lesion of lung 09/15/2020   AF (paroxysmal atrial fibrillation) (HCC) 09/15/2020   Failure to thrive in adult 09/15/2020   Severe protein-calorie malnutrition [name]: less than 60% of standard weight) (HCC) 09/15/2020   Dysphagia 09/15/2020   Hypotension 09/15/2020   Multifocal pneumonia 09/14/2020   Chronic low back pain (Left) w/o sciatica 09/10/2020   Pharmacologic therapy 05/05/2020   Chronic diarrhea 05/04/2020   DDD (degenerative disc disease), lumbosacral 11/19/2019   Therapeutic opioid-induced constipation (OIC) 08/01/2019   Chronic sacroiliac joint pain (Left) 05/01/2019   Chronic groin pain (Left) 05/01/2019   Spondylosis without myelopathy or radiculopathy, lumbosacral region 05/01/2019   Other specified dorsopathies, sacral and sacrococcygeal region 05/01/2019   Renal artery stenosis (HCC) 12/27/2017   Pain in limb 12/22/2017   Tobacco use disorder 12/22/2017   Renal atrophy, right 12/22/2017   Thoracic radiculitis 01/31/2017   Chronic lower extremity pain (1ry area of Pain) (Bilateral) (L>R) 04/25/2016   Chronic upper extremity pain (3ry area of Pain) (Bilateral) (L>R) 04/25/2016   Disturbance of skin sensation 04/20/2016   Neurogenic pain 04/20/2016   Vitamin D deficiency 11/11/2015   Edema 09/02/2015   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   Long term current use of opiate analgesic 08/14/2015   Long term prescription opiate use 08/14/2015   Uncomplicated opioid  dependence (HCC) 08/14/2015   Opiate use (30 MME/Day) 08/14/2015   Chronic low back pain (2ry area of Pain) (Bilateral) (L>R) w/ sciatica (Bilateral) 08/14/2015   Chronic pain syndrome 08/14/2015   Alcoholic (HCC) 08/14/2015   B12 deficiency 08/14/2015   Narrowing of intervertebral disc space 08/14/2015   History of metabolic disorder 08/14/2015   Fungal infection of toenail 08/14/2015   Osteopenia 08/14/2015   Compulsive tobacco user syndrome 08/14/2015   Osteoarthritis of hip (Left) 08/14/2015   Chronic hip pain (Left) 08/14/2015   Chronic lumbar radicular pain (L5 dermatome) (Bilateral) (L>R) 08/14/2015   Lumbar facet syndrome (Bilateral) (L>R) 08/14/2015   Lumbar spondylosis 08/14/2015   Lumbar Postlaminectomy syndrome (L4-5) 08/14/2015   Fibromyalgia 08/14/2015   COPD (chronic obstructive pulmonary disease) (HCC) 04/21/2015   Allergic rhinitis 02/10/2010   Atrial paroxysmal tachycardia (HCC) 07/06/2009   CAFL (chronic airflow limitation) (HCC) 07/06/2009   Acid reflux 07/06/2009   Glaucoma 07/06/2009   Essential hypertension 07/06/2009   Hypercholesterolemia without hypertriglyceridemia 07/06/2009   Absence of bladder continence 07/06/2009   PCP:  Jacky Kindle, FNP Pharmacy:   [facility] [place], [place] - [address] [place] [address] Phone: [phone] Fax: [phone]  [facility] - [place], [place] - [address] [place] [address] Phone: [phone] Fax: [phone]     Social Determinants of Health (SDOH) Interventions    Readmission Risk Interventions Readmission Risk Prevention Plan 09/16/2020  Transportation Screening Complete  PCP or Specialist Appt  within 3-5 Days Complete  HRI or Home Care Consult Complete  Social Work Consult for Recovery Care Planning/Counseling Complete  Palliative Care Screening Not Applicable  Medication Review Oceanographer)  Complete  Some recent data might be hidden

## 2021-08-18 NOTE — Progress Notes (Signed)
NAME:  RAYVON BRANDVOLD, MRN:  [id number], DOB:  Nov 29, 1945, LOS: 2 ADMISSION DATE:  08/12/2021, CONSULTATION DATE:  08/16/21 REFERRING MD:  Chipper Herb, CHIEF COMPLAINT:  AMS, decreased responsiveness, increased WOB   History of Present Illness:  75yF with PMH as noted below who presented to the ED for dyspnea on 10/23. She had recently been discharged to SNF following hospitalization 9/28-10/3 for right femoral fracture repair. Since leaving SNF she'd had about 10d of diarrhea as well as constipation.   She required 2L nasal cannula in setting of her pneumonia. She was started on vanc/cefepime and cultured, given 1.5L saline bolus, started on saline maintenance fluids and overnight started on bicarb infusion for severe metabolic acidosis. Primary team reached out today for increased work of breathing not long after starting bicarb gtt, decreased responsiveness, gurgling/concern for aspiration.  Pertinent  Medical History  GERD Smoking COPD RUL cavitary lesion Recent right femoral fracture pAF  Significant Hospital Events: Including procedures, antibiotic start and stop dates in addition to other pertinent events   10/23 admitted 10/24 intubated in setting encephalopathy and acute hypoxic respiratory failure with multifocal pneumonia and likely ongoing aspiration, metabolic acidosis  Interim History / Subjective:  Apparently vomiited medications given via OGT. Abdomen more distended today. Intermittent runs of AF/RVR, NSVT.  Objective   Blood pressure 120/61, pulse 76, temperature 97.7 F (36.5 C), temperature source Axillary, resp. rate (!) 23, height 4\' 8"  (1.422 m), weight 45.9 kg, SpO2 100 %.    Vent Mode: PRVC FiO2 (%):  [30 %] 30 % Set Rate:  [22 bmp] 22 bmp Vt Set:  [400 mL] 400 mL PEEP:  [5 cmH20] 5 cmH20 Plateau Pressure:  [20 cmH20-23 cmH20] 23 cmH20   Intake/Output Summary (Last 24 hours) at 08/18/2021 0951 Last data filed at 08/18/2021 0845 Gross per 24 hour  Intake  5193.85 ml  Output 1550 ml  Net 3643.85 ml   Filed Weights   08/14/2021 1250 08/16/21 1021 08/18/21 0459  Weight: 40.4 kg 40.7 kg 45.9 kg    Examination:   Physical exam: General appearance: 75 y.o., female, intubated sedated Eyes: anicteric sclerae, moist conjunctivae; no lid-lag; PERRL Lungs: coarse no crackles,  with equal chest rise CV: RRR, no MRGs  Abdomen:  distended but not taut, BS present  Extremities: traceedema, warm Neuro: does not waken to loud voice, localizes to pain  Bedside 73 with full IVC without respiratory variation despite distended belly  Leukocytosis Mild TCP S Cr trending down   Resolved Hospital Problem list     Assessment & Plan:   Acute hypoxic respiratory failure Multifocal pneumonia COPD without exacerbation Septic shock due to multifocal pneumonia vs intraabdominal infection Full vent support Continue sedation with RASS goal 0 to -1 Unasyn for now  SAT/SBT CT CAP today Wean neo for map 65  Acute septic/metabolic encephalopathy Closely monitor Avoid deep sedation  Hyponatremia hypokalemia Closely monitor electrolytes and supplement  Acute kidney injury, likely due to ATN Anion and non-gap acidosis Mild hyperkalemia, resolved CTM I/O Stop isotonic fluids appears euvolemic Avoid nephrotoxic medications  Paroxysmal atrial fibrillation Intermittent RVR, BP stable Not on AC at home Closely monitor  Leukocytosis Thrombocytopenia May be due to sepsis. - CTM  Cavitary lung lesion: Chest CT showed improvement in cavitary lesion Pulmonary follow up post hospitalization  Best Practice (right click and "Reselect all SmartList Selections" daily)   Diet/type: Tube feeds on hold pending CT A/P DVT prophylaxis: prophylactic heparin  GI prophylaxis: PPI Lines: N/A Foley:  Yes,  and it is still needed Code Status:  full code Last date of multidisciplinary goals of care discussion [10/24 updated family by phone today, in  agreement with time-limited trial of mechanical ventilation and/or dialysis for a week before reassessing whether we're seeing clinical improvement. Remains full code.]  Critical Care Time: 25 minutes  Laroy Apple Pulmonary/Critical Care

## 2021-08-18 NOTE — Progress Notes (Signed)
[facility]  ROUNDING NOTE   Subjective:   UOP 1574mL  LR at 152mL/hr D10 at 23mL/hr Back on phenylephrine gtt  Afebrile.   Objective:  Vital signs in last 24 hours:  Temp:  [97.6 F (36.4 C)-99.3 F (37.4 C)] 97.7 F (36.5 C) (10/26 0800) Pulse Rate:  [51-101] 76 (10/26 0800) Resp:  [16-37] 23 (10/26 0800) BP: (90-133)/(47-87) 120/61 (10/26 0800) SpO2:  [92 %-100 %] 100 % (10/26 0800) FiO2 (%):  [30 %] 30 % (10/26 0759) Weight:  [45.9 kg] 45.9 kg (10/26 0459)  Weight change: 5.2 kg Filed Weights   08/03/2021 1250 08/16/21 1021 08/18/21 0459  Weight: 40.4 kg 40.7 kg 45.9 kg    Intake/Output: I/O last 3 completed shifts: In: 6260.5 [I.V.:5607.4; NG/GT:53.1; IV Piggyback:600.1] Out: 2200 [Urine:2200]   Intake/Output this shift:  Total I/O In: 568.7 [I.V.:568.7] Out: -   Physical Exam: General: Critically ill  Head: ETT, OGT  Eyes: Anicteric, PERRL  Neck:  trachea midline  Lungs:  PRVC FiO2 30%  Heart: Regular rate and rhythm  Abdomen:  +distended  Extremities:  No peripheral edema.  Neurologic: Intubated, sedated  Skin: No lesions       Basic Metabolic Panel: Recent Labs  Lab 08/06/2021 1248 08/16/21 0441 08/16/21 1226 08/16/21 1450 08/16/21 1812 08/16/21 1944 08/16/21 2346 08/17/21 0635 08/18/21 0514  NA 120* 122*   < > 124* 124*  --  124* 129* 133*  K 5.1 5.3*  --  4.8  --   --   --  3.5 3.6  CL 84* 90*  --  93*  --   --   --  93* 101  CO2 16* 14*  --  16*  --   --   --  23 22  GLUCOSE 74 74  --  125*  --   --   --  224* 122*  BUN 89* 95*  --  97*  --   --   --  80* 62*  CREATININE 3.94* 3.45*  --  3.34*  --   --   --  2.12* 1.28*  CALCIUM 7.0* 6.6*  --  6.9*  --  6.8*  --  6.5* 7.1*  MG  --   --   --  1.8  --   --   --  2.2 1.9  PHOS  --   --   --  9.3*  --   --   --  6.4* 4.5   < > = values in this interval not displayed.     Liver Function Tests: Recent Labs  Lab 08/16/2021 1248  AST 136*  ALT 68*  ALKPHOS 111   BILITOT 0.8  PROT 6.3*  ALBUMIN 3.0*    No results for input(s): LIPASE, AMYLASE in the last 168 hours. No results for input(s): AMMONIA in the last 168 hours.  CBC: Recent Labs  Lab 08/19/2021 1248 08/16/21 0441 08/17/21 0635 08/18/21 0514  WBC 7.1 14.3* 19.6* 22.5*  NEUTROABS  --   --  18.6*  --   HGB 11.3* 10.8* 9.0* 9.4*  HCT 32.8* 31.9* 24.1* 26.3*  MCV 83.7 81.8 77.5* 77.4*  PLT 229 210 144* 125*     Cardiac Enzymes: No results for input(s): CKTOTAL, CKMB, CKMBINDEX, TROPONINI in the last 168 hours.  BNP: Invalid input(s): POCBNP  CBG: Recent Labs  Lab 08/17/21 1529 08/17/21 1928 08/17/21 2343 08/18/21 0403 08/18/21 0718  GLUCAP 70 82 85 111* 130*     Microbiology: Results  for orders placed or performed during the hospital encounter of 08/21/2021  Resp Panel by RT-PCR (Flu A&B, Covid) Nasopharyngeal Swab     Status: None   Collection Time: 08/12/2021  2:22 PM   Specimen: Nasopharyngeal Swab; Nasopharyngeal(NP) swabs in vial transport medium  Result Value Ref Range Status   SARS Coronavirus 2 by RT PCR NEGATIVE NEGATIVE Final    Comment: (NOTE) SARS-CoV-2 target nucleic acids are NOT DETECTED.  The SARS-CoV-2 RNA is generally detectable in upper respiratory specimens during the acute phase of infection. The lowest concentration of SARS-CoV-2 viral copies this assay can detect is 138 copies/mL. A negative result does not preclude SARS-Cov-2 infection and should not be used as the sole basis for treatment or other patient management decisions. A negative result may occur with  improper specimen collection/handling, submission of specimen other than nasopharyngeal swab, presence of viral mutation(s) within the areas targeted by this assay, and inadequate number of viral copies(<138 copies/mL). A negative result must be combined with clinical observations, patient history, and epidemiological information. The expected result is Negative.  Fact Sheet for  Patients:  EntrepreneurPulse.com.au  Fact Sheet for Healthcare Providers:  IncredibleEmployment.be  This test is no t yet approved or cleared by the [place] FDA and  has been authorized for detection and/or diagnosis of SARS-CoV-2 by FDA under an Emergency Use Authorization (EUA). This EUA will remain  in effect (meaning this test can be used) for the duration of the COVID-19 declaration under Section 564(b)(1) of the Act, 21 U.S.C.section 360bbb-3(b)(1), unless the authorization is terminated  or revoked sooner.       Influenza A by PCR NEGATIVE NEGATIVE Final   Influenza B by PCR NEGATIVE NEGATIVE Final    Comment: (NOTE) The Xpert Xpress SARS-CoV-2/FLU/RSV plus assay is intended as an aid in the diagnosis of influenza from Nasopharyngeal swab specimens and should not be used as a sole basis for treatment. Nasal washings and aspirates are unacceptable for Xpert Xpress SARS-CoV-2/FLU/RSV testing.  Fact Sheet for Patients: EntrepreneurPulse.com.au  Fact Sheet for Healthcare Providers: IncredibleEmployment.be  This test is not yet approved or cleared by the [place] FDA and has been authorized for detection and/or diagnosis of SARS-CoV-2 by FDA under an Emergency Use Authorization (EUA). This EUA will remain in effect (meaning this test can be used) for the duration of the COVID-19 declaration under Section 564(b)(1) of the Act, 21 U.S.C. section 360bbb-3(b)(1), unless the authorization is terminated or revoked.  Performed at [facility], [address] [place], [place] [address]   Blood Culture (routine x 2)     Status: None (Preliminary result)   Collection Time: 07/29/2021  3:47 PM   Specimen: BLOOD  Result Value Ref Range Status   Specimen Description BLOOD LEFT ANTECUBITAL  Final   Special Requests   Final    BOTTLES DRAWN AEROBIC AND ANAEROBIC Blood Culture results may not  be optimal due to an inadequate volume of blood received in culture bottles   Culture   Final    NO GROWTH 3 DAYS Performed at [facility], [address] [place], [place] [address]    Report Status PENDING  Incomplete  Blood Culture (routine x 2)     Status: None (Preliminary result)   Collection Time: 07/26/2021  5:31 PM   Specimen: BLOOD  Result Value Ref Range Status   Specimen Description BLOOD RIGHT [facility]  Final   Special Requests   Final    BOTTLES DRAWN AEROBIC AND ANAEROBIC  Blood Culture adequate volume   Culture   Final    NO GROWTH 3 DAYS Performed at [facility], [address] [place], [place] [address]    Report Status PENDING  Incomplete  MRSA Next Gen by PCR, Nasal     Status: None   Collection Time: 08/16/21  9:34 AM   Specimen: Nasal Mucosa; Nasal Swab  Result Value Ref Range Status   MRSA by PCR Next Gen NOT DETECTED NOT DETECTED Final    Comment: (NOTE) The GeneXpert MRSA Assay (FDA approved for NASAL specimens only), is one component of a comprehensive MRSA colonization surveillance program. It is not intended to diagnose MRSA infection nor to guide or monitor treatment for MRSA infections. Test performance is not FDA approved in patients less than 65 years old. Performed at [facility], [address] [place], [place] [address]   MRSA Next Gen by PCR, Nasal     Status: None   Collection Time: 08/16/21 10:20 AM   Specimen: Nasal Mucosa; Nasal Swab  Result Value Ref Range Status   MRSA by PCR Next Gen NOT DETECTED NOT DETECTED Final    Comment: (NOTE) The GeneXpert MRSA Assay (FDA approved for NASAL specimens only), is one component of a comprehensive MRSA colonization surveillance program. It is not intended to diagnose MRSA infection nor to guide or monitor treatment for MRSA infections. Test performance is not FDA approved in patients less than 69 years old. Performed at [facility], [address],  [place], [place] [address]   Urine Culture     Status: None   Collection Time: 08/16/21  4:53 PM   Specimen: In/Out Cath Urine  Result Value Ref Range Status   Specimen Description   Final    IN/OUT CATH URINE Performed at [facility], [address], [place], [place] [address]    Special Requests   Final    NONE Performed at [facility], [address] [place], [place] [address]    Culture   Final    NO GROWTH Performed at [facility], [place] [address] [place], [place] [address]    Report Status 08/17/2021 FINAL  Final  Culture, Respiratory w Gram Stain     Status: None (Preliminary result)   Collection Time: 08/17/21  8:08 AM   Specimen: Tracheal Aspirate; Respiratory  Result Value Ref Range Status   Specimen Description   Final    TRACHEAL ASPIRATE Performed at [facility], [address], [place], [place] [address]    Special Requests   Final    NONE Performed at [facility], [address] [place], [place] [address]    Gram Stain   Final    ABUNDANT WBC PRESENT, PREDOMINANTLY PMN FEW YEAST FEW GRAM POSITIVE RODS RARE GRAM NEGATIVE RODS    Culture   Final    CULTURE REINCUBATED FOR BETTER GROWTH Performed at [facility], [place] [address], [place], [place] [address]    Report Status PENDING  Incomplete    Coagulation Studies: Recent Labs    07/31/2021 1248  LABPROT 14.7  INR 1.2     Urinalysis: Recent Labs    08/16/21 1653  COLORURINE YELLOW*  LABSPEC 1.012  PHURINE 5.0  GLUCOSEU NEGATIVE  HGBUR LARGE*  BILIRUBINUR NEGATIVE  KETONESUR NEGATIVE  PROTEINUR 30*  NITRITE NEGATIVE  LEUKOCYTESUR NEGATIVE       Imaging: DG Abd 1 View  Result Date: 08/16/2021 CLINICAL DATA:  Encounter  for OG tube placement EXAM: ABDOMEN - 1 VIEW COMPARISON:  CT 08/15/2009 FINDINGS: NG tube with tip just beyond the GE junction. Side port is above the GE junction. Persistent dilated loops of small bowel centrally in  the abdomen measuring up to 3.8 cm. Small a gas the rectum. IMPRESSION: 1. NG tube with tip just beyond the GE junction. 2. Persistent dilated loops of small bowel centrally. Electronically Signed   By: Suzy Bouchard M.D.   On: 08/16/2021 15:35   US RENAL  Result Date: 08/16/2021 CLINICAL DATA:  Renal failure EXAM: RENAL / URINARY TRACT ULTRASOUND COMPLETE COMPARISON:  CT 08/19/2021 FINDINGS: Right Kidney: Renal measurements: 5.8 x 2.2 x 2.5 cm = volume: 16.3 mL. Atrophic RIGHT kidney. No hydronephrosis increased cortical echogenicity Left Kidney: Renal measurements: 10.0 x 5.4 x 5.8 cm = volume: 160 mL. Echogenicity within normal limits. No mass or hydronephrosis visualized. Bladder: Foley catheter in bladder Other: Intubated patient.  Exam performed in ICU IMPRESSION: 1. Atrophic echogenic RIGHT kidney. 2. Normal LEFT kidney. 3. No hydronephrosis. 4. Foley catheter in bladder. Electronically Signed   By: Suzy Bouchard M.D.   On: 08/16/2021 17:57   DG Chest Port 1 View  Result Date: 08/16/2021 CLINICAL DATA:  Endotracheal tube present Z97.8 (ICD-10-CM) EXAM: PORTABLE CHEST 1 VIEW COMPARISON:  August 15, 2021. FINDINGS: Increased patchy interstitial and airspace opacities throughout both lungs. Endotracheal tube tip approximately 8 mm above the carina. Gastric tube courses below diaphragm with the tip likely in the stomach. Side port is likely above the GE junction. Similar enlargement the cardiac silhouette. No visible pleural effusions or pneumothorax on this semi erect radiograph. Please see concurrent abdominal radiographs for abdominal evaluation. IMPRESSION: 1. Endotracheal tube tip approximately 8 mm above the carina. Recommend retraction. 2. Gastric tube courses below the diaphragm with the tip likely in the stomach. The side port is above the GE junction. If intragastric positioning of the side port is desired, recommend advancement. 3. Increased patchy interstitial and airspace opacities  throughout both lungs, concerning for multifocal pneumonia better evaluated on recent CT chest. 4. Please see concurrent abdominal radiographs for abdominal evaluation. These results will be called to the ordering clinician or representative by the Radiologist Assistant, and communication documented in the PACS or [employer]. Electronically Signed   By: Margaretha Sheffield M.D.   On: 08/16/2021 15:42     Medications:    sodium chloride     ampicillin-sulbactam (UNASYN) IV Stopped (08/17/21 2205)   dextrose 40 mL/hr at 08/18/21 0845   fentaNYL infusion INTRAVENOUS 150 mcg/hr (08/18/21 0845)   lactated ringers 100 mL/hr at 08/18/21 0845   phenylephrine (NEO-SYNEPHRINE) Adult infusion 65 mcg/min (08/18/21 0845)    arformoterol  15 mcg Nebulization BID   And   umeclidinium bromide  1 puff Inhalation Daily   atorvastatin  40 mg Oral Daily   chlorhexidine gluconate (MEDLINE KIT)  15 mL Mouth Rinse BID   Chlorhexidine Gluconate Cloth  6 each Topical Daily   docusate  100 mg Per Tube BID   feeding supplement (VITAL AF 1.2 CAL)  1,000 mL Per Tube Q24H   heparin  5,000 Units Subcutaneous Q8H   insulin aspart  0-15 Units Subcutaneous Q4H   latanoprost  1 drop Both Eyes QHS   levalbuterol  0.63 mg Nebulization Q6H   mouth rinse  15 mL Mouth Rinse 10 times per day   pantoprazole (PROTONIX) IV  40 mg Intravenous Q24H   polyethylene glycol  17 g Oral  BID   acetaminophen **OR** acetaminophen, albuterol, fentaNYL, midazolam, midazolam, nicotine, ondansetron **OR** ondansetron (ZOFRAN) IV  Assessment/ Plan:  Suzanne Avila is a 75 y.o. white female with COPD, hypertension, hyperlipidemia, glaucoma, diastolic congestive heart failure who was admitted to [facility] on 08/14/2021 for Abdominal distension [R14.0] Hypoxia [R09.02] Acute hypoxemic respiratory failure ([place]) [J96.01] Community acquired pneumonia, unspecified laterality [J18.9]   Acute renal failure with acute anion gap acidosis:  Secondary to prerenal azotemia. baseline creatinine of 0.81, normal GFR on 07/23/21. History of bland urinalysis. Renal ultrasound with right atrophic kidney. Nonoliguric urine output. Creatinine improving. Acidosis resolving.  - holding home dose of ramipril, spironolactone, and torsemide.  - Continue IV fluids   Hyponatremia: acute on chronic. Improving with IV fluids. 133   Acute respiratory failure. Requiring intubation and mechanical ventilation.    Hypotension: secondary to septic shock. Weaned off norepinephrine. Remains on phenylephrine. Holding diltiazem and as above: ramipril, sprionolactone, and torsemide. Empiric antibiotics with Unasyn. Blood cultures without growth.     LOS: 2 Suzanne Avila 10/26/20229:09 AM

## 2021-08-18 NOTE — Progress Notes (Signed)
eLink Physician-Brief Progress Note Patient Name: ETHELYNE ERICH DOB: [birth year] MRN: [id number]   Date of Service  08/18/2021  HPI/Events of Note  Multiple loose stools - Nursing request for Flexiseal.  eICU Interventions  Plan: Place Flexiseal.      Intervention Category Major Interventions: Other:  Amrit Cress Dennard Nip 08/18/2021, 4:21 AM

## 2021-08-18 NOTE — Progress Notes (Signed)
Patient did not follow any commands today but is responsive to voice. Patient  easily aroused and agitated on sedation. Patient has been in NSR with runs of Vtach, SVT, ventricular bigeminy rhythms. Patient gets tachy with agitation, does not sustain. MD made aware. PRNs given for pain and to maintain RAAS per orders. NG tube repositioned, and new xray ordered. Patient's watch and bracelet sent home with family. Rings removed and put in bag.

## 2021-08-18 NOTE — Progress Notes (Signed)
Inpatient Diabetes Program Recommendations  AACE/ADA: New Consensus Statement on Inpatient Glycemic Control   Target Ranges:  Prepandial:   less than 140 mg/dL      Peak postprandial:   less than 180 mg/dL (1-2 hours)      Critically ill patients:  140 - 180 mg/dL    Results for JAYDIN, JALOMO (MRN [id number]) as of 08/18/2021 13:38  Ref. Range 08/18/2021 04:03 08/18/2021 07:18 08/18/2021 11:10 08/18/2021 12:24  Glucose-Capillary Latest Ref Range: 70 - 99 mg/dL 413 (H) 244 (H)  Novolog 2 units @8 :04 67 (L) 114 (H)  Results for HEDI, BARKAN (MRN [name]) as of 08/18/2021 13:38  Ref. Range 08/17/2021 07:19 08/17/2021 11:15 08/17/2021 15:29 08/17/2021 19:28 08/17/2021 23:43  Glucose-Capillary Latest Ref Range: 70 - 99 mg/dL 08/19/2021 (H)  Novolog 5 units @9 :11 86 70 82 85   Review of Glycemic Control  Current orders for Inpatient glycemic control: Novolog 0-15 units Q4H  Inpatient Diabetes Program Recommendations:    Insulin: Please consider decreasing Novolog to 0-6 units Q4H.  Thanks, 644, RN, MSN, CDE Diabetes Coordinator Inpatient Diabetes Program [phone] (Team Pager from 8am to 5pm)

## 2021-08-19 ENCOUNTER — Inpatient Hospital Stay: Payer: Medicare Other

## 2021-08-19 ENCOUNTER — Inpatient Hospital Stay: Payer: Self-pay

## 2021-08-19 DIAGNOSIS — R6521 Severe sepsis with septic shock: Secondary | ICD-10-CM | POA: Diagnosis not present

## 2021-08-19 DIAGNOSIS — A419 Sepsis, unspecified organism: Secondary | ICD-10-CM | POA: Diagnosis not present

## 2021-08-19 LAB — PROTEIN ELECTROPHORESIS, SERUM
A/G Ratio: 0.8 (ref 0.7–1.7)
Albumin ELP: 2 g/dL — ABNORMAL LOW (ref 2.9–4.4)
Alpha-1-Globulin: 0.6 g/dL — ABNORMAL HIGH (ref 0.0–0.4)
Alpha-2-Globulin: 0.9 g/dL (ref 0.4–1.0)
Beta Globulin: 0.7 g/dL (ref 0.7–1.3)
Gamma Globulin: 0.3 g/dL — ABNORMAL LOW (ref 0.4–1.8)
Globulin, Total: 2.5 g/dL (ref 2.2–3.9)
M-Spike, %: 0.1 g/dL — ABNORMAL HIGH
Total Protein ELP: 4.5 g/dL — ABNORMAL LOW (ref 6.0–8.5)

## 2021-08-19 LAB — COMPREHENSIVE METABOLIC PANEL
ALT: 40 U/L (ref 0–44)
AST: 39 U/L (ref 15–41)
Albumin: 1.8 g/dL — ABNORMAL LOW (ref 3.5–5.0)
Alkaline Phosphatase: 93 U/L (ref 38–126)
Anion gap: 9 (ref 5–15)
BUN: 44 mg/dL — ABNORMAL HIGH (ref 8–23)
CO2: 20 mmol/L — ABNORMAL LOW (ref 22–32)
Calcium: 7.9 mg/dL — ABNORMAL LOW (ref 8.9–10.3)
Chloride: 105 mmol/L (ref 98–111)
Creatinine, Ser: 1.02 mg/dL — ABNORMAL HIGH (ref 0.44–1.00)
GFR, Estimated: 57 mL/min — ABNORMAL LOW (ref >60)
Glucose, Bld: 158 mg/dL — ABNORMAL HIGH (ref 70–99)
Potassium: 3.2 mmol/L — ABNORMAL LOW (ref 3.5–5.1)
Sodium: 134 mmol/L — ABNORMAL LOW (ref 135–145)
Total Bilirubin: 1.1 mg/dL (ref 0.3–1.2)
Total Protein: 4.6 g/dL — ABNORMAL LOW (ref 6.5–8.1)

## 2021-08-19 LAB — PHOSPHORUS: Phosphorus: 3.6 mg/dL (ref 2.5–4.6)

## 2021-08-19 LAB — CBC WITH DIFFERENTIAL/PLATELET
Abs Immature Granulocytes: 0.07 10*3/uL (ref 0.00–0.07)
Basophils Absolute: 0 10*3/uL (ref 0.0–0.1)
Basophils Relative: 0 %
Eosinophils Absolute: 0 10*3/uL (ref 0.0–0.5)
Eosinophils Relative: 0 %
HCT: 22.8 % — ABNORMAL LOW (ref 36.0–46.0)
Hemoglobin: 8.3 g/dL — ABNORMAL LOW (ref 12.0–15.0)
Immature Granulocytes: 1 %
Lymphocytes Relative: 5 %
Lymphs Abs: 0.6 10*3/uL — ABNORMAL LOW (ref 0.7–4.0)
MCH: 28.8 pg (ref 26.0–34.0)
MCHC: 36.4 g/dL — ABNORMAL HIGH (ref 30.0–36.0)
MCV: 79.2 fL — ABNORMAL LOW (ref 80.0–100.0)
Monocytes Absolute: 0.5 10*3/uL (ref 0.1–1.0)
Monocytes Relative: 4 %
Neutro Abs: 11 10*3/uL — ABNORMAL HIGH (ref 1.7–7.7)
Neutrophils Relative %: 90 %
Platelets: 92 10*3/uL — ABNORMAL LOW (ref 150–400)
RBC: 2.88 MIL/uL — ABNORMAL LOW (ref 3.87–5.11)
RDW: 18.6 % — ABNORMAL HIGH (ref 11.5–15.5)
Smear Review: NORMAL
WBC: 12.2 10*3/uL — ABNORMAL HIGH (ref 4.0–10.5)
nRBC: 0 % (ref 0.0–0.2)

## 2021-08-19 LAB — GLUCOSE, CAPILLARY
Glucose-Capillary: 145 mg/dL — ABNORMAL HIGH (ref 70–99)
Glucose-Capillary: 176 mg/dL — ABNORMAL HIGH (ref 70–99)
Glucose-Capillary: 121 mg/dL — ABNORMAL HIGH (ref 70–99)
Glucose-Capillary: 76 mg/dL (ref 70–99)
Glucose-Capillary: 79 mg/dL (ref 70–99)
Glucose-Capillary: 174 mg/dL — ABNORMAL HIGH (ref 70–99)
Glucose-Capillary: 161 mg/dL — ABNORMAL HIGH (ref 70–99)

## 2021-08-19 LAB — CULTURE, RESPIRATORY W GRAM STAIN: Culture: NORMAL

## 2021-08-19 LAB — LIPASE, BLOOD: Lipase: 33 U/L (ref 11–51)

## 2021-08-19 LAB — MAGNESIUM: Magnesium: 2.2 mg/dL (ref 1.7–2.4)

## 2021-08-19 MED ORDER — FREE WATER
30.0000 mL | Status: DC
  Administered 2021-08-19 – 2021-08-21 (×10): 30 mL

## 2021-08-19 MED ORDER — SODIUM CHLORIDE 0.9% FLUSH: 10.0000 mL | INTRAVENOUS | Status: DC | PRN

## 2021-08-19 MED ORDER — MIDAZOLAM HCL 2 MG/2ML IJ SOLN
2.0000 mg | Freq: Once | INTRAMUSCULAR | Status: AC
  Administered 2021-08-19: 2 mg via INTRAVENOUS

## 2021-08-19 MED ORDER — AMIODARONE LOAD VIA INFUSION
150.0000 mg | Freq: Once | INTRAVENOUS | Status: DC
  Filled 2021-08-19: qty 83.34

## 2021-08-19 MED ORDER — POTASSIUM CHLORIDE 20 MEQ PO PACK: 40.0000 meq | PACK | ORAL | Status: DC

## 2021-08-19 MED ORDER — AMIODARONE HCL IN DEXTROSE 360-4.14 MG/200ML-% IV SOLN: 30.0000 mg/h | INTRAVENOUS | Status: DC

## 2021-08-19 MED ORDER — POTASSIUM CHLORIDE 10 MEQ/100ML IV SOLN
10.0000 meq | INTRAVENOUS | Status: DC
  Filled 2021-08-19 (×4): qty 100

## 2021-08-19 MED ORDER — AMIODARONE HCL IN DEXTROSE 360-4.14 MG/200ML-% IV SOLN: 60.0000 mg/h | INTRAVENOUS | Status: DC

## 2021-08-19 MED ORDER — MIDAZOLAM HCL 2 MG/2ML IJ SOLN: 2.0000 mg | Freq: Once | INTRAMUSCULAR | Status: DC

## 2021-08-19 MED ORDER — MIDAZOLAM HCL 2 MG/2ML IJ SOLN
2.0000 mg | Freq: Once | INTRAMUSCULAR | Status: AC
  Administered 2021-08-19: 2 mg via INTRAVENOUS
  Filled 2021-08-19: qty 2

## 2021-08-19 MED ORDER — AMIODARONE IV BOLUS ONLY 150 MG/100ML
INTRAVENOUS | Status: AC
  Administered 2021-08-19: 150 mg
  Filled 2021-08-19: qty 100

## 2021-08-19 MED ORDER — AMIODARONE HCL IN DEXTROSE 360-4.14 MG/200ML-% IV SOLN
INTRAVENOUS | Status: AC
  Filled 2021-08-19: qty 200

## 2021-08-19 MED ORDER — POTASSIUM CHLORIDE 20 MEQ PO PACK
40.0000 meq | PACK | ORAL | Status: AC
  Administered 2021-08-19 (×2): 40 meq
  Filled 2021-08-19 (×2): qty 2

## 2021-08-19 MED ORDER — SODIUM CHLORIDE 0.9% FLUSH
10.0000 mL | Freq: Two times a day (BID) | INTRAVENOUS | Status: DC
  Administered 2021-08-19 – 2021-08-22 (×6): 10 mL
  Administered 2021-08-22: 30 mL
  Administered 2021-08-23: 10 mL
  Administered 2021-08-23: 30 mL
  Administered 2021-08-24 – 2021-08-26 (×5): 10 mL

## 2021-08-19 MED ORDER — VITAL AF 1.2 CAL PO LIQD
1000.0000 mL | ORAL | Status: DC
  Administered 2021-08-19: 1000 mL

## 2021-08-19 MED ORDER — POTASSIUM CHLORIDE 20 MEQ PO PACK
20.0000 meq | PACK | ORAL | Status: DC
Start: 2021-08-19 — End: 2021-08-19

## 2021-08-19 NOTE — Progress Notes (Signed)
During SBT with sedation off and post PICC line placement pts hr increased to 200's with elevated bp 130's/100's. Therefore, sedation restarted and pt received prn versed/fentanyl.  Amiodarone bolus initially started, however pt immediately converted to nsr following 1 minute of amiodarone bolus therefore amiodarone bolus stopped.  Updated pts husband and son regarding decline in pts condition and code status.  At this time pt to remain a FULL Code.  They stated they would visit pt tomorrow 10/28.  Will continue to monitor and assess pt.   Camie Patience, AGNP  Pulmonary/Critical Care Pager [phone] (please enter 7 digits) PCCM Consult Pager [phone] (please enter 7 digits)

## 2021-08-19 NOTE — Progress Notes (Signed)
Peripherally Inserted Central Catheter Placement  The IV Nurse has discussed with the patient and/or persons authorized to consent for the patient, the purpose of this procedure and the potential benefits and risks involved with this procedure.  The benefits include less needle sticks, lab draws from the catheter, and the patient may be discharged home with the catheter. Risks include, but not limited to, infection, bleeding, blood clot (thrombus formation), and puncture of an artery; nerve damage and irregular heartbeat and possibility to perform a PICC exchange if needed/ordered by physician.  Alternatives to this procedure were also discussed.  Bard Power PICC patient education guide, fact sheet on infection prevention and patient information card has been provided to patient /or left at bedside.    PICC Placement Documentation  PICC Triple Lumen 08/19/21 PICC Right Brachial 40 cm 0 cm (Active)  Indication for Insertion or Continuance of Line Vasoactive infusions 08/19/21 1349  Exposed Catheter (cm) 0 cm 08/19/21 1349  Site Assessment Clean;Intact;Dry 08/19/21 1349  Lumen #1 Status Flushed;Blood return noted;Saline locked 08/19/21 1349  Lumen #2 Status Flushed;Blood return noted;Saline locked 08/19/21 1349  Lumen #3 Status Flushed;Blood return noted;Saline locked 08/19/21 1349  Dressing Type Transparent 08/19/21 1349  Dressing Status Clean;Dry;Intact 08/19/21 1349  Antimicrobial disc in place? Yes 08/19/21 1349  Dressing Change Due 08/26/21 08/19/21 1349       Audrie Gallus 08/19/2021, 1:51 PM

## 2021-08-19 NOTE — Progress Notes (Signed)
D10 restarted- blood sugar 76. Continue to monitor.

## 2021-08-19 NOTE — Progress Notes (Signed)
Spoke with Annabelle Harman NP this am regarding patients IV access. Orders for PICC line placed. Sedation stopped for weaning trials as well. Once PICC line being placed patient heart rate went into 200's. Versed 2 mg IV times two given, fentanyl and precedex restarted. Suctioned patient with no success. Amiodarone bolus ordered and given patients heart rate immediately dropped into the 60's. Per NP stopped bolus. Continue to assess.

## 2021-08-19 NOTE — Progress Notes (Addendum)
NAME:  Suzanne Avila, MRN:  [id number], DOB:  1946/08/06, LOS: 3 ADMISSION DATE:  08/28/21, CONSULTATION DATE:  08/16/21  History of Present Illness:  75yF with PMH as noted below who presented to the ED for dyspnea on 10/23. She had recently been discharged to SNF following hospitalization 9/28-10/3 for right femoral fracture repair. Since leaving SNF she'd had about 10d of diarrhea as well as constipation.   She required 2L nasal cannula in setting of her pneumonia. She was started on vanc/cefepime and cultured, given 1.5L saline bolus, started on saline maintenance fluids and overnight started on bicarb infusion for severe metabolic acidosis. Primary team reached out today for increased work of breathing not long after starting bicarb gtt, decreased responsiveness, gurgling/concern for aspiration.  Pertinent  Medical History  GERD Smoking COPD RUL cavitary lesion Recent right femoral fracture pAF  Significant Hospital Events: Including procedures, antibiotic start and stop dates in addition to other pertinent events   10/23 admitted 10/24 intubated in setting encephalopathy and acute hypoxic respiratory failure with multifocal pneumonia and likely ongoing aspiration, metabolic acidosis 10/24 US Renal Atrophic echogenic RIGHT kidney. Normal LEFT kidney. No hydronephrosis. Foley catheter in bladder 10/26 CT Chest/Abd/Pelvis revealed persistent and markedly progressive patchy multifocal pneumonia. Small bilateral pleural effusions with overlying atelectasis. Intra and extrahepatic biliary dilatation appears new or at least more obvious with contrast. No obvious common bile duct stones or pancreatic head mass. Recommend correlation with liver function studies. MRCP may be helpful for further evaluation if the patient can hold her breath. Suspect inflammatory changes involving the bowel as detailed above. No obstruction. Abnormally thickened endometrium worrisome for endometrial  carcinoma. Recommend follow-up pelvic ultrasound examination and consideration for endometrial sampling. Severe atherosclerotic calcifications involving the aorta and branch vessels including the coronary arteries. 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 Small right kidney with marked renal cortical atrophy likely due to chronic renal artery stenosis.  10/26 CT Head revealed no acute intracranial pathology. Small bilateral mastoid effusions 10/27 Will perform WUA and attempt SBT     Interim History / Subjective:  No acute events overnight pt sedated with low dose precedex gtt and fentanyl gtt unable to follow commands withdraws from painful stimulation.  However, attempting to open eyes when her name is called.  She remains on minimal ventilator settings FiO2 @28 % and tolerating well.    Objective   Blood pressure (!) 109/55, pulse 65, temperature 97.9 F (36.6 C), temperature source Axillary, resp. rate (!) 22, height 4\' 8"  (1.422 m), weight 45.2 kg, SpO2 99 %.    Vent Mode: PRVC FiO2 (%):  [28 %-30 %] 28 % Set Rate:  [22 bmp] 22 bmp Vt Set:  [400 mL] 400 mL PEEP:  [5 cmH20] 5 cmH20 Plateau Pressure:  [23 cmH20] 23 cmH20   Intake/Output Summary (Last 24 hours) at 08/19/2021 0716 Last data filed at 08/19/2021 0600 Gross per 24 hour  Intake 2895.09 ml  Output 2080 ml  Net 815.09 ml   Filed Weights   08/16/21 1021 08/18/21 0459 08/19/21 0416  Weight: 40.7 kg 45.9 kg 45.2 kg    Examination: General: acutely on chronically ill appearing female, NAD resting in bed mechanically intubated  HENT: supple, no JVD  Lungs: diminished throughout, even, non labored  Cardiovascular: sinus rhythm, no R/G, 2+ radial/1+ distal pulses, 1+ generalized edema  Abdomen: hypoactive BS x4, soft, non distended  Extremities: normal bulk and tone Neuro: sedated, not following commands but withdraws from  painful stimulation and attempting to open her eyes to her name,  PERRL GU: indwelling foley catheter draining dark yellow urine   Resolved Hospital Problem list   Mild Hyperkalemia   Assessment & Plan:   Acute hypoxic respiratory failure Multifocal pneumonia COPD without exacerbation Septic shock due to multifocal pneumonia vs intraabdominal infection Continue ventilator support & lung protective strategies Wean PEEP & FiO2 as tolerated, maintain SpO2 > 90% Head of bed elevated 30 degrees, VAP protocol in place Plateau pressures less than 30 cm H20  Intermittent chest x-ray & ABG PRN Daily WUA with SBT as tolerated  Ensure adequate pulmonary hygiene    Paroxysmal atrial fibrillation Intermittent Ventricular Bigeminy and Ventricular Tachycardia  Intermittent RVR, BP stable Not on AC at home Continuous telemetry monitoring   Acute kidney injury, likely due to ATN~improving  Anion and non-gap acidosis Hyponatremia  Hypokalemia Trend BMP Replace electrolytes  Monitor UOP Will stop continuous iv fluids   Incidental finding of 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 per CT Abd/Pelvis 10/26 Will consult vascular surgery   Leukocytosis Thrombocytopenia likely secondary to sepsis  Trend CBC  Monitor for s/sx of bleeding  If platelet count continues to decrease will discontinue subq heparin   Intra and extrahepatic biliary dilatation per CT Abd/Pelvis 10/26 Liver enzymes are normal and pt remains mechanically intubated will hold off on MRCP at this time  Thickened endometrium worrisome for endometrial carcinoma per CT Abd/Pelvis 10/26 Once extubated will need transvaginal US for further assessment   Cavitary lung lesion: Chest CT showed improvement in cavitary lesion Pulmonary follow up post hospitalization  Acute septic/metabolic encephalopathy Maintain RASS goal 0 to -1 Precedex gtt to maintain RASS goal  WUA daily  Best Practice (right click and "Reselect all SmartList Selections" daily)   Diet/type:  NPO DVT prophylaxis: prophylactic heparin  GI prophylaxis: PPI Lines: N/A Foley:  Yes, and it is still needed Code Status:  full code Last date of multidisciplinary goals of care discussion [08/19/2021]   Critical Care Time: 35 minutes   Camie Patience, AGNP  Pulmonary/Critical Care Pager [phone] (please enter 7 digits) PCCM Consult Pager [phone] (please enter 7 digits)

## 2021-08-20 ENCOUNTER — Inpatient Hospital Stay
Admit: 2021-08-20 | Discharge: 2021-08-20 | Disposition: A | Discharge status: Home / Self Care | Payer: Medicare Other | Attending: Critical Care Medicine | Admitting: Critical Care Medicine

## 2021-08-20 ENCOUNTER — Inpatient Hospital Stay: Payer: Medicare Other

## 2021-08-20 DIAGNOSIS — R6520 Severe sepsis without septic shock: Secondary | ICD-10-CM | POA: Diagnosis not present

## 2021-08-20 DIAGNOSIS — Z515 Encounter for palliative care: Secondary | ICD-10-CM | POA: Diagnosis not present

## 2021-08-20 DIAGNOSIS — J189 Pneumonia, unspecified organism: Secondary | ICD-10-CM

## 2021-08-20 DIAGNOSIS — R0902 Hypoxemia: Secondary | ICD-10-CM | POA: Diagnosis not present

## 2021-08-20 DIAGNOSIS — Z7189 Other specified counseling: Secondary | ICD-10-CM | POA: Diagnosis not present

## 2021-08-20 DIAGNOSIS — A419 Sepsis, unspecified organism: Secondary | ICD-10-CM | POA: Diagnosis not present

## 2021-08-20 LAB — PHOSPHORUS: Phosphorus: 3.3 mg/dL (ref 2.5–4.6)

## 2021-08-20 LAB — CULTURE, BLOOD (ROUTINE X 2)
Culture: NO GROWTH
Culture: NO GROWTH
Special Requests: ADEQUATE

## 2021-08-20 LAB — GLUCOSE, CAPILLARY
Glucose-Capillary: 120 mg/dL — ABNORMAL HIGH (ref 70–99)
Glucose-Capillary: 126 mg/dL — ABNORMAL HIGH (ref 70–99)
Glucose-Capillary: 135 mg/dL — ABNORMAL HIGH (ref 70–99)
Glucose-Capillary: 137 mg/dL — ABNORMAL HIGH (ref 70–99)
Glucose-Capillary: 163 mg/dL — ABNORMAL HIGH (ref 70–99)
Glucose-Capillary: 80 mg/dL (ref 70–99)

## 2021-08-20 LAB — ECHOCARDIOGRAM COMPLETE
AR max vel: 1.48 cm2
AV Area VTI: 1.43 cm2
AV Area mean vel: 1.31 cm2
AV Mean grad: 4 mmHg
AV Peak grad: 6.5 mmHg
Ao pk vel: 1.28 m/s
Area-P 1/2: 3.15 cm2
Height: 56 in
MV VTI: 0.89 cm2
S' Lateral: 2.8 cm
Weight: 1689.61 oz

## 2021-08-20 LAB — BASIC METABOLIC PANEL
Anion gap: 5 (ref 5–15)
BUN: 35 mg/dL — ABNORMAL HIGH (ref 8–23)
CO2: 21 mmol/L — ABNORMAL LOW (ref 22–32)
Calcium: 8.2 mg/dL — ABNORMAL LOW (ref 8.9–10.3)
Chloride: 107 mmol/L (ref 98–111)
Creatinine, Ser: 0.81 mg/dL (ref 0.44–1.00)
GFR, Estimated: >60 mL/min (ref >60)
Glucose, Bld: 380 mg/dL — ABNORMAL HIGH (ref 70–99)
Potassium: 4.6 mmol/L (ref 3.5–5.1)
Sodium: 133 mmol/L — ABNORMAL LOW (ref 135–145)

## 2021-08-20 LAB — PROCALCITONIN: Procalcitonin: 7.38 ng/mL

## 2021-08-20 LAB — CBC
HCT: 23.8 % — ABNORMAL LOW (ref 36.0–46.0)
Hemoglobin: 8.4 g/dL — ABNORMAL LOW (ref 12.0–15.0)
MCH: 28.9 pg (ref 26.0–34.0)
MCHC: 35.3 g/dL (ref 30.0–36.0)
MCV: 81.8 fL (ref 80.0–100.0)
Platelets: 85 10*3/uL — ABNORMAL LOW (ref 150–400)
RBC: 2.91 MIL/uL — ABNORMAL LOW (ref 3.87–5.11)
RDW: 18.9 % — ABNORMAL HIGH (ref 11.5–15.5)
WBC: 10.3 10*3/uL (ref 4.0–10.5)
nRBC: 0 % (ref 0.0–0.2)

## 2021-08-20 LAB — MAGNESIUM
Magnesium: 1.6 mg/dL — ABNORMAL LOW (ref 1.7–2.4)
Magnesium: 1.6 mg/dL — ABNORMAL LOW (ref 1.7–2.4)
Magnesium: 2.2 mg/dL (ref 1.7–2.4)

## 2021-08-20 LAB — LEGIONELLA PNEUMOPHILA SEROGP 1 UR AG: L. pneumophila Serogp 1 Ur Ag: NEGATIVE

## 2021-08-20 LAB — HEPARIN INDUCED PLATELET AB (HIT ANTIBODY): Heparin Induced Plt Ab: 0.084 OD (ref 0.000–0.400)

## 2021-08-20 LAB — D-DIMER, QUANTITATIVE: D-Dimer, Quant: 5.55 ug/mL-FEU — ABNORMAL HIGH (ref 0.00–0.50)

## 2021-08-20 MED ORDER — SODIUM CHLORIDE 0.9 % IV SOLN
3.0000 g | Freq: Four times a day (QID) | INTRAVENOUS | Status: DC
  Filled 2021-08-20 (×3): qty 8

## 2021-08-20 MED ORDER — MAGNESIUM SULFATE 4 GM/100ML IV SOLN
4.0000 g | Freq: Once | INTRAVENOUS | Status: AC
  Administered 2021-08-20: 4 g via INTRAVENOUS
  Filled 2021-08-20 (×2): qty 100

## 2021-08-20 MED ORDER — FUROSEMIDE 10 MG/ML IJ SOLN
40.0000 mg | Freq: Once | INTRAMUSCULAR | Status: AC
  Administered 2021-08-20: 40 mg via INTRAVENOUS
  Filled 2021-08-20: qty 4

## 2021-08-20 MED ORDER — FENTANYL CITRATE PF 50 MCG/ML IJ SOSY
25.0000 ug | PREFILLED_SYRINGE | INTRAMUSCULAR | Status: DC | PRN
  Administered 2021-08-20 – 2021-08-21 (×2): 50 ug via INTRAVENOUS
  Filled 2021-08-20 (×2): qty 1

## 2021-08-20 MED ORDER — JUVEN PO PACK
1.0000 | PACK | Freq: Two times a day (BID) | ORAL | Status: DC
  Administered 2021-08-20: 1 via ORAL

## 2021-08-20 MED ORDER — HEPARIN SODIUM (PORCINE) 5000 UNIT/ML IJ SOLN
5000.0000 [IU] | Freq: Three times a day (TID) | INTRAMUSCULAR | Status: DC
  Administered 2021-08-20 – 2021-08-26 (×18): 5000 [IU] via SUBCUTANEOUS
  Filled 2021-08-20 (×18): qty 1

## 2021-08-20 MED ORDER — VITAL AF 1.2 CAL PO LIQD
1000.0000 mL | ORAL | Status: DC
  Administered 2021-08-20: 1000 mL

## 2021-08-20 MED ORDER — ADULT MULTIVITAMIN LIQUID CH
15.0000 mL | Freq: Every day | ORAL | Status: DC
  Administered 2021-08-21: 15 mL
  Filled 2021-08-20: qty 15

## 2021-08-20 NOTE — Progress Notes (Signed)
Dana NP notified of patients blood pressure and labored breathing. Patient on SBT. Orders to call Respitory to change vent setting. Continue to assess

## 2021-08-20 NOTE — Consult Note (Signed)
Pharmacy Antibiotic Note  Suzanne Avila is a 75 y.o. female with medical history including chronic pain syndrome, HTN, HLD, paroxysmal Afib, HFpEF, GERD admitted on 08-19-2021 with pneumonia. Pharmacy has been consulted for Unasyn dosing.  Patient initially admitted to general medicine floor where she subsequently decompensated on 10/24. She was transferred to the ICU where she was ultimately intubated. She was started on broad spectrum antibiotics with cefepime + vancomycin + metronidazole.   Today, 08/20/21  --Remains intubated, sedated, and on MV --Off pressors --AKI resolved --Afebrile, leukocytosis resolved, PCT profoundly elevated but trending down when last checked --Cultures unrevealing --Day # 6 antibiotics narrowed to Unasyn  Plan:  Adjust Unasyn to 3 g IV q6h based on improved CrCl ( > 30 mL/min)  Height: 4\' 8"  (142.2 cm) Weight: 47.9 kg (105 lb 9.6 oz) IBW/kg (Calculated) : 36.3  Temp (24hrs), Avg:98.7 F (37.1 C), Min:98 F (36.7 C), Max:99.9 F (37.7 C)  Recent Labs  Lab Aug 19, 2021 1422 Aug 19, 2021 1728 08/16/21 0441 08/16/21 1427 08/16/21 1450 08/17/21 0635 08/18/21 0514 08/19/21 0443 08/20/21 0544  WBC  --   --  14.3*  --   --  19.6* 22.5* 12.2* 10.3  CREATININE  --   --  3.45*  --  3.34* 2.12* 1.28* 1.02* 0.81  LATICACIDVEN 2.4* 2.3*  --  1.4  --   --   --   --   --   VANCORANDOM  --   --   --   --   --  13  --   --   --      Estimated Creatinine Clearance: 38.2 mL/min (by C-G formula based on SCr of 0.81 mg/dL).    Allergies  Allergen Reactions   Codeine Itching   Cyclobenzaprine Itching   Penicillins     Itching without rash per patient.  Unable to say when it happened, other than "a long time ago"    Antimicrobials this admission: Vancomycin 10/23 x 1 Cefepime 10/23 >> 10/24 Azithromycin 10/24 x 1 Metronidazole 10/24 >> 10/24 Unasyn 10/25 >>   Dose adjustments this admission: N/A  Microbiology results: 10/23 BCx: NGTD 10/24 UCx:  NG 10/24 MRSA PCR: (-) 10/25 Sputum: NF  Thank you for allowing pharmacy to be a part of this patient's care.  11/25 08/20/2021 8:22 AM

## 2021-08-20 NOTE — Progress Notes (Signed)
NAME:  TEKOA HAMOR, MRN:  [id number], DOB:  11-14-1945, LOS: 4 ADMISSION DATE:  08/07/2021, CONSULTATION DATE:  08/16/21  History of Present Illness:  75yF with PMH as noted below who presented to the ED for dyspnea on 10/23. She had recently been discharged to SNF following hospitalization 9/28-10/3 for right femoral fracture repair. Since leaving SNF she'd had about 10d of diarrhea as well as constipation.   She required 2L nasal cannula in setting of her pneumonia. She was started on vanc/cefepime and cultured, given 1.5L saline bolus, started on saline maintenance fluids and overnight started on bicarb infusion for severe metabolic acidosis. Primary team reached out today for increased work of breathing not long after starting bicarb gtt, decreased responsiveness, gurgling/concern for aspiration.  Pertinent  Medical History  GERD Smoking COPD RUL cavitary lesion Recent right femoral fracture pAF  Significant Hospital Events: Including procedures, antibiotic start and stop dates in addition to other pertinent events   10/23 admitted 10/24 intubated in setting encephalopathy and acute hypoxic respiratory failure with multifocal pneumonia and likely ongoing aspiration, metabolic acidosis 10/24 US Renal Atrophic echogenic RIGHT kidney. Normal LEFT kidney. No hydronephrosis. Foley catheter in bladder 10/26 CT Chest/Abd/Pelvis revealed persistent and markedly progressive patchy multifocal pneumonia. Small bilateral pleural effusions with overlying atelectasis. Intra and extrahepatic biliary dilatation appears new or at least more obvious with contrast. No obvious common bile duct stones or pancreatic head mass. Recommend correlation with liver function studies. MRCP may be helpful for further evaluation if the patient can hold her breath. Suspect inflammatory changes involving the bowel as detailed above. No obstruction. Abnormally thickened endometrium worrisome for endometrial  carcinoma. Recommend follow-up pelvic ultrasound examination and consideration for endometrial sampling. Severe atherosclerotic calcifications involving the aorta and branch vessels including the coronary arteries. 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 Small right kidney with marked renal cortical atrophy likely due to chronic renal artery stenosis. 10/26 CT Head revealed no acute intracranial pathology. Small bilateral mastoid effusions 10/27 Will perform WUA and attempt SBT. Episode of sustained rapid afib rate 200s after PICC placement, rate improved and in NSR after 1 min amiodarone bolus and adjustment of PICC.  10/28 Remains on MV, attempt SBT today on precedex gtt.     Interim History / Subjective:  No acute events overnight  Remains intubated, sedated with precedex gtt. Fentanyl gtt turned off for SBT trial this am. Withdraws from painful stimulation; attempts to open eyes on command. Tachypnea intermittently on SBT.   Objective   Blood pressure (!) 149/76, pulse 73, temperature 99.9 F (37.7 C), resp. rate (!) 24, height 4\' 8"  (1.422 m), weight 47.9 kg, SpO2 99 %.    Vent Mode: PSV;CPAP FiO2 (%):  [24 %-28 %] 24 % Set Rate:  [18 bmp-22 bmp] 22 bmp Vt Set:  [400 mL] 400 mL PEEP:  [5 cmH20] 5 cmH20 Pressure Support:  [8 cmH20] 8 cmH20 Plateau Pressure:  [20 cmH20-21 cmH20] 20 cmH20   Intake/Output Summary (Last 24 hours) at 08/20/2021 0810 Last data filed at 08/20/2021 0500 Gross per 24 hour  Intake 2193.59 ml  Output 1100 ml  Net 1093.59 ml   Filed Weights   08/18/21 0459 08/19/21 0416 08/20/21 0347  Weight: 45.9 kg 45.2 kg 47.9 kg    Examination: General: acutely on chronically ill appearing female, NAD resting in bed mechanically intubated on SBT HENT: supple, no JVD  Lungs: diminished throughout, even, non labored  Cardiovascular: sinus rhythm, no R/G,  2+ radial/1+ distal pulses, 1+ generalized edema  Abdomen: hypoactive BS  x4, soft, non distended  Extremities: normal bulk and tone Neuro: sedated, not following commands but withdraws from painful stimulation and attempting to open her eyes to her name, PERRL GU: indwelling foley catheter draining dark yellow urine   Resolved Hospital Problem list   Mild Hyperkalemia   Assessment & Plan:   Acute hypoxic respiratory failure Multifocal pneumonia COPD without exacerbation Septic shock due to multifocal pneumonia vs intraabdominal infection Continue ventilator support & lung protective strategies Wean PEEP & FiO2 as tolerated, maintain SpO2 > 90% Head of bed elevated 30 degrees, VAP protocol in place Plateau pressures less than 30 cm H20  Intermittent chest x-ray & ABG PRN Daily WUA with SBT as tolerated  Ensure adequate pulmonary hygiene    Paroxysmal atrial fibrillation Intermittent Ventricular Bigeminy and Ventricular Tachycardia  Intermittent RVR, BP stable Not on AC at home Continuous telemetry monitoring  Echo today  Acute kidney injury, likely due to ATN~improving  Anion and non-gap acidosis Hyponatremia  Hypokalemia Trend BMP Replace electrolytes  Monitor UOP Will stop continuous IV fluids   Incidental finding of 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 per CT Abd/Pelvis 10/26 Will consult vascular surgery   Leukocytosis, resolving Thrombocytopenia likely secondary to sepsis, trending down Trend CBC  Monitor for s/sx of bleeding  Hold heparin given continued trend down in platelets Pending HIT panel DC Unasyn   Intra and extrahepatic biliary dilatation per CT Abd/Pelvis 10/26 Liver enzymes are normal and pt remains mechanically intubated will hold off on MRCP at this time  Thickened endometrium worrisome for endometrial carcinoma per CT Abd/Pelvis 10/26 Once extubated will need transvaginal US for further assessment   Cavitary lung lesion: Chest CT showed improvement in cavitary lesion Pulmonary follow up  post hospitalization  Acute septic/metabolic encephalopathy Maintain RASS goal 0 to -1 Precedex gtt to maintain RASS goal  WUA daily  Best Practice (right click and "Reselect all SmartList Selections" daily)   Diet/type: Tube feeds - trickle DVT prophylaxis: prophylactic heparin  - on hold due to tcp GI prophylaxis: PPI Lines: N/A Foley:  Yes, and it is still needed Code Status:  full code Last date of multidisciplinary goals of care discussion [08/20/2021]   Critical Care Time: 35 minutes     Emily Filbert, PA-S [place] DPAS

## 2021-08-20 NOTE — Progress Notes (Signed)
*  PRELIMINARY RESULTS* Echocardiogram 2D Echocardiogram has been performed.  Suzanne Avila 08/20/2021, 10:53 AM

## 2021-08-20 NOTE — Consult Note (Signed)
@LOGO @   MRN : [id number]  Suzanne Avila is a 75 y.o. (04/22/1946) female    History of Present Illness:  I am asked to evaluate the patient by Camie Patience, NP  Patient is a 75 year old woman who is critically ill and intubated as well as sedated in the intensive care unit.  Patient presented on August 16, 2021 with increasing shortness of breath unfortunately over the first 24 hours her situation deteriorated and she has subsequently been intubated.  History is obtained from the chart as the patient is unable to communicate since she is intubated and sedated.  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  It is for this reason I was asked to evaluate the patient.  Review of her past medical record does document renal artery stenosis however there is no past diagnosis of peripheral vascular disease or claudication type symptoms.  Current Meds  Medication Sig   albuterol (PROVENTIL) (2.5 MG/3ML) 0.083% nebulizer solution Take 3 mLs (2.5 mg total) by nebulization every 6 (six) hours as needed for wheezing or shortness of breath.   albuterol (VENTOLIN HFA) 108 (90 Base) MCG/ACT inhaler Inhale 2 puffs into the lungs every 6 (six) hours as needed for wheezing or shortness of breath.   atorvastatin (LIPITOR) 40 MG tablet Take 1 tablet (40 mg total) by mouth daily.   diltiazem (CARDIZEM) 120 MG tablet Take 1 tablet (120 mg total) by mouth daily.   docusate sodium (COLACE) 100 MG capsule Take 1 capsule (100 mg total) by mouth 2 (two) times daily.   dorzolamide (TRUSOPT) 2 % ophthalmic solution Place 1 drop into both eyes 2 (two) times daily.   latanoprost (XALATAN) 0.005 % ophthalmic solution Place 1 drop into both eyes at bedtime.   magnesium gluconate (MAGONATE) 500 MG tablet Take 500 mg by mouth daily.   mirtazapine (REMERON) 7.5 MG tablet Take 1  tablet (7.5 mg total) by mouth at bedtime.   Multiple Vitamin (MULTI-VITAMIN) tablet Take 1 tablet by mouth daily.    omeprazole (PRILOSEC) 40 MG capsule Take 1 capsule (40 mg total) by mouth daily.   polyethylene glycol (MIRALAX / GLYCOLAX) 17 g packet Take 17 g by mouth daily as needed for mild constipation.   pregabalin (LYRICA) 150 MG capsule Take 1 capsule (150 mg total) by mouth every 8 (eight) hours.   ramipril (ALTACE) 2.5 MG capsule Take 1 capsule (2.5 mg total) by mouth daily.   rOPINIRole (REQUIP) 2 MG tablet Take 1 tablet (2 mg total) by mouth at bedtime.   spironolactone (ALDACTONE) 25 MG tablet Take 1 tablet (25 mg total) by mouth daily.   Tiotropium Bromide-Olodaterol (STIOLTO RESPIMAT) 2.5-2.5 MCG/ACT AERS Inhale 2 puffs into the lungs daily.   tizanidine (ZANAFLEX) 2 MG capsule Take 1 capsule (2 mg total) by mouth 3 (three) times daily as needed for muscle spasms.   torsemide (DEMADEX) 20 MG tablet TAKE 2 TABLETS TWICE A DAY   vitamin B-12 (CYANOCOBALAMIN) 500 MCG tablet Take 1 tablet (500 mcg total) by mouth daily.    Past Medical History:  Diagnosis Date   Closed left arm fracture    COPD (chronic obstructive pulmonary disease) (HCC)    Dehydration 09/15/2020   Glaucoma    Hyperlipidemia    Hypertension    Lung mass     Past Surgical History:  Procedure Laterality Date   BACK  SURGERY     CARPAL TUNNEL RELEASE Left    EYE SURGERY     FEMUR IM NAIL Right 07/21/2021   Procedure: INTRAMEDULLARY (IM) RETROGRADE FEMORAL NAILING;  Surgeon: Kennedy Bucker, MD;  Location: [facility] ORS;  Service: Orthopedics;  Laterality: Right;    Social History Social History   Tobacco Use   Smoking status: Every Day    Packs/day: 1.50    Years: 50.00    Pack years: 75.00    Types: Cigarettes   Smokeless tobacco: Never   Tobacco comments:    1PPD 07/07/2021  Vaping Use   Vaping Use: Former  Substance Use Topics   Alcohol use: Yes    Alcohol/week: 35.0 standard drinks    Types:  35 Cans of beer per week    Comment: sytates she quit 09/23/2020   Drug use: No    Family History Family History  Problem Relation Age of Onset   Cancer Mother    Cancer Father     Allergies  Allergen Reactions   Codeine Itching   Cyclobenzaprine Itching   Penicillins     Itching without rash per patient.  Unable to say when it happened, other than "a long time ago"     REVIEW OF SYSTEMS (Negative unless checked) patient is intubated and sedated and unable to give a review of systems   Physical Examination  Vitals:   08/20/21 1400 08/20/21 1430 08/20/21 1500 08/20/21 1530  BP: (!) 124/58 128/70 (!) 145/82 140/76  Pulse: 66 72 (!) 107 (!) 109  Resp: (!) 24 (!) 24 (!) 32 (!) 30  Temp:   99.6 F (37.6 C)   TempSrc:      SpO2: 99% 99% 97% 97%  Weight:      Height:       Body mass index is 23.68 kg/m. Gen: WD/WN, critically ill Head: [place]/AT, No temporalis wasting.  Ear/Nose/Throat: Orotracheal tube present, nares w/o erythema or drainage Eyes: PER, , sclera nonicteric.  Neck: Supple, no masses.  No bruit or JVD.  Pulmonary:  Good air movement, no audible wheezing, no use of accessory muscles.  Cardiac: RRR, normal S1, S2, no Murmurs. Vascular: Both feet are pink and warm with 1 to 2-second capillary refill.  There are no open wounds at this time Vessel Right Left  Radial Palpable Palpable  Femoral Not palpable 1+ palpable  PT Not palpable Not palpable  DP Not palpable Not palpable  Gastrointestinal:  + distended. No guarding/not rigid.  Musculoskeletal: No visible deformity.  Neurologic: Patient sedated  psychiatric: Patient sedated  dermatologic: No rashes or ulcers noted.  No changes consistent with cellulitis.   CBC Lab Results  Component Value Date   WBC 10.3 08/20/2021   HGB 8.4 (L) 08/20/2021   HCT 23.8 (L) 08/20/2021   MCV 81.8 08/20/2021   PLT 85 (L) 08/20/2021    BMET    Component Value Date/Time   NA 133 (L) 08/20/2021 0544   NA 140  05/21/2021 0930   NA 135 (L) 03/22/2014 2245   K 4.6 08/20/2021 0544   K 2.9 (L) 03/22/2014 2245   CL 107 08/20/2021 0544   CL 102 03/22/2014 2245   CO2 21 (L) 08/20/2021 0544   CO2 23 03/22/2014 2245   GLUCOSE 380 (H) 08/20/2021 0544   GLUCOSE 77 03/22/2014 2245   BUN 35 (H) 08/20/2021 0544   BUN 17 05/21/2021 0930   BUN 7 03/22/2014 2245   CREATININE 0.81 08/20/2021 0544   CREATININE 0.91  03/22/2014 2245   CALCIUM 8.2 (L) 08/20/2021 0544   CALCIUM 9.2 03/22/2014 2245   GFRNONAA >60 08/20/2021 0544   GFRNONAA >60 03/22/2014 2245   GFRAA 62 11/11/2020 1448   GFRAA >60 03/22/2014 2245   Estimated Creatinine Clearance: 38.2 mL/min (by C-G formula based on SCr of 0.81 mg/dL).  COAG Lab Results  Component Value Date   INR 1.2 08/16/2021   INR 1.0 07/21/2021   INR 1.3 (H) 09/15/2020    Radiology DG Abd 1 View  Result Date: 08/18/2021 CLINICAL DATA:  Feeding tube placement EXAM: ABDOMEN - 1 VIEW COMPARISON:  08/18/2021 FINDINGS: Esophageal tube has been advanced, the tip and side port now project over the stomach, there is a curve in the distal portion of the catheter. Persistent gaseous dilatation of central small bowel up to 4.3 cm. Probable pill fragments in the right lower quadrant IMPRESSION: Esophageal tube tip and side-port overlie the proximal stomach, possible partial kink in the distal portion of the tube Electronically Signed   By: Jasmine Pang M.D.   On: 08/18/2021 19:42   DG Abd 1 View  Result Date: 08/18/2021 CLINICAL DATA:  Advancement of feeding tube EXAM: ABDOMEN - 1 VIEW COMPARISON:  08/18/2021 FINDINGS: Esophageal tube tip remains at the region of GE junction with side port at the level of distal esophagus. The appearance does not appear significantly changed. Slight decreased gaseous dilatation of the bowel. IMPRESSION: Esophageal tube tip over GE junction, side-port at the level of distal esophagus, consider further advancement for more optimal positioning.  These results will be called to the ordering clinician or representative by the Radiologist Assistant, and communication documented in the PACS or [employer]. Electronically Signed   By: Jasmine Pang M.D.   On: 08/18/2021 15:55   DG Abd 1 View  Result Date: 08/18/2021 CLINICAL DATA:  Feeding tube placement EXAM: ABDOMEN - 1 VIEW COMPARISON:  08/16/2021 FINDINGS: Esophageal tube tip overlies GE junction, side-port over the distal esophagus. Persistent dilatation of bowel in the central abdomen. Compression deformity at T11. IMPRESSION: Esophageal tube side-port over distal esophagus, tip over GE junction, suggest further advancement for more optimal positioning Electronically Signed   By: Jasmine Pang M.D.   On: 08/18/2021 15:53   DG Abd 1 View  Result Date: 08/16/2021 CLINICAL DATA:  Encounter for OG tube placement EXAM: ABDOMEN - 1 VIEW COMPARISON:  CT 08/15/2009 FINDINGS: NG tube with tip just beyond the GE junction. Side port is above the GE junction. Persistent dilated loops of small bowel centrally in the abdomen measuring up to 3.8 cm. Small a gas the rectum. IMPRESSION: 1. NG tube with tip just beyond the GE junction. 2. Persistent dilated loops of small bowel centrally. Electronically Signed   By: Genevive Bi M.D.   On: 08/16/2021 15:35   CT HEAD WO CONTRAST ( )  Result Date: 08/18/2021 CLINICAL DATA:  Delirium EXAM: CT HEAD WITHOUT CONTRAST TECHNIQUE: Contiguous axial images were obtained from the base of the skull through the vertex without intravenous contrast. COMPARISON:  Maxillofacial CT 11/07/2007 FINDINGS: Brain: There is no acute intracranial hemorrhage, acute extra-axial fluid collection, or acute infarct. There is mild parenchymal volume loss. The ventricles are not enlarged. There is no mass lesion. There is slight rightward shift of the ventricles most likely due to patient position within the scanner. Vascular: There is calcification of the bilateral cavernous  ICAs. Skull: Normal. Negative for fracture or focal lesion. Sinuses/Orbits: The imaged paranasal sinuses are clear. Bilateral lens implants  are in place. The globes and orbits are otherwise unremarkable. Other: There is trace fluid in the mastoid air cells bilaterally. The imaged nasopharynx is unremarkable. IMPRESSION: 1. No acute intracranial pathology. 2. Small bilateral mastoid effusions. Electronically Signed   By: Lesia Hausen M.D.   On: 08/18/2021 14:47   US RENAL  Result Date: 08/16/2021 CLINICAL DATA:  Renal failure EXAM: RENAL / URINARY TRACT ULTRASOUND COMPLETE COMPARISON:  CT 09/06/21 FINDINGS: Right Kidney: Renal measurements: 5.8 x 2.2 x 2.5 cm = volume: 16.3 mL. Atrophic RIGHT kidney. No hydronephrosis increased cortical echogenicity Left Kidney: Renal measurements: 10.0 x 5.4 x 5.8 cm = volume: 160 mL. Echogenicity within normal limits. No mass or hydronephrosis visualized. Bladder: Foley catheter in bladder Other: Intubated patient.  Exam performed in ICU IMPRESSION: 1. Atrophic echogenic RIGHT kidney. 2. Normal LEFT kidney. 3. No hydronephrosis. 4. Foley catheter in bladder. Electronically Signed   By: Genevive Bi M.D.   On: 08/16/2021 17:57   CT CHEST ABDOMEN PELVIS W CONTRAST  Result Date: 08/18/2021 CLINICAL DATA:  Sepsis. EXAM: CT CHEST, ABDOMEN, AND PELVIS WITH CONTRAST TECHNIQUE: Multidetector CT imaging of the chest, abdomen and pelvis was performed following the standard protocol during bolus administration of intravenous contrast. CONTRAST:  1mL OMNIPAQUE IOHEXOL 300 MG/ML  SOLN COMPARISON:  CT scan 09/06/2021 FINDINGS: CT CHEST FINDINGS Cardiovascular: The heart is within normal limits in size and stable. No pericardial effusion. Stable atherosclerotic calcifications involving the aorta and coronary arteries. No aortic dissection or focal aneurysm. The pulmonary arteries are grossly normal. Mediastinum/Nodes: Stable scattered mediastinal and hilar lymph nodes likely  reactive/inflammatory, given the lung findings. There is an NG tube coursing down the esophagus and into the stomach. Lungs/Pleura: Persistent and progressive patchy multifocal pneumonia. There are small bilateral pleural effusions with overlying atelectasis. No pneumothorax or obvious lung mass. Musculoskeletal: No significant bony findings. Diffuse body wall edema is noted. CT ABDOMEN PELVIS FINDINGS Hepatobiliary: Intra and extrahepatic biliary dilatation appears new or at least more obvious with contrast. The common bile duct measures a maximum of 12 mm in the porta hepatis it does taper in the head of the pancreas and I do not see an obvious pancreatic head mass or ampullary lesion. Suspect small gallstones in the gallbladder. No obvious common bile duct stones. Recommend correlation with liver function studies. MRCP may be helpful for further evaluation if the patient can hold her breath. Pancreas: No mass, inflammation or ductal dilatation. Spleen: Normal size.  No focal lesions. Adrenals/Urinary Tract: Adrenal glands are unremarkable. Small right kidney with marked renal cortical atrophy likely due to chronic renal artery stenosis. The left kidney demonstrates mild compensatory hypertrophy. The bladder is decompressed by a Foley catheter. Stomach/Bowel: Suspect inflammatory changes involving the stomach particularly in the body/antral region. No obvious ulcer. The duodenum is grossly normal. There are dilated loops of small bowel which are fluid-filled. Other small bowel loops are normal in caliber. There is also scattered fluid in the colon which is also intermittently dilated. Areas of moderate mucosal enhancement are noted involving the right colon. Vascular/Lymphatic: Severe atherosclerotic calcifications involving the aorta and branch vessels but no aneurysm. I suspect there is complete or near 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. Scattered mesenteric and retroperitoneal lymph nodes but  no mass or overt adenopathy. Reproductive: The endometrium is abnormally dilated (16 mm) and there is moderate enhancement of the myometrium. Findings worrisome for endometrial carcinoma. Recommend follow-up pelvic ultrasound examination and consideration for endometrial sampling. Other: No free air  or significant free fluid collections. No intra-abdominal/intrapelvic abscess is identified. Diffuse and marked body wall edema noted. Musculoskeletal: No acute bony findings. Remote thoracic spine compression fractures at T4 and T11. Lumbar fusion hardware noted. IMPRESSION: 1. Persistent and markedly progressive patchy multifocal pneumonia. 2. Small bilateral pleural effusions with overlying atelectasis. 3. Intra and extrahepatic biliary dilatation appears new or at least more obvious with contrast. No obvious common bile duct stones or pancreatic head mass. Recommend correlation with liver function studies. MRCP may be helpful for further evaluation if the patient can hold her breath. 4. Suspect inflammatory changes involving the bowel as detailed above. No obstruction. 1 5. Abnormally thickened endometrium worrisome for endometrial carcinoma. Recommend follow-up pelvic ultrasound examination and consideration for endometrial sampling. 6. Severe atherosclerotic calcifications involving the aorta and branch vessels including the coronary arteries. 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 7. Small right kidney with marked renal cortical atrophy likely due to chronic renal artery stenosis. Electronically Signed   By: Rudie Meyer M.D.   On: 08/18/2021 15:12   DG Chest Port 1 View  Result Date: 08/20/2021 CLINICAL DATA:  Pneumonia EXAM: PORTABLE CHEST 1 VIEW COMPARISON:  Chest x-ray dated August 19, 2021 FINDINGS: Interval advancement of ET tube which is positioned approximately 3.0 cm from the carina. Unchanged position of left arm central venous line. Enteric tube tip is in  the stomach. Cardiac and mediastinal contours are unchanged. Unchanged patchy bilateral opacities. No large pleural effusion or evidence pneumothorax. IMPRESSION: Interval advancement of ET tube which is positioned approximately 3.0 cm from the carina. Electronically Signed   By: Allegra Lai M.D.   On: 08/20/2021 10:03   DG Chest Port 1 View  Addendum Date: 08/19/2021   ADDENDUM REPORT: 08/19/2021 16:00 ADDENDUM: Left upper extremity PICC may have been minimally retracted in comparison the prior examination and is in appropriate position near the superior cavoatrial junction. Electronically Signed   By: Jearld Lesch M.D.   On: 08/19/2021 16:00   Result Date: 08/19/2021 CLINICAL DATA:  Central line placement EXAM: PORTABLE CHEST 1 VIEW COMPARISON:  08/19/2021 FINDINGS: No significant change in AP portable chest radiograph. Endotracheal tube remains positioned just below the thoracic inlet, approximately 6 cm above the carina. Other support apparatus unchanged. Cardiomegaly with diffuse bilateral interstitial and heterogeneous airspace opacity. IMPRESSION: 1. No significant change in AP portable chest radiograph. Endotracheal tube remains positioned just below the thoracic inlet, approximately 6 cm above the carina. Other support apparatus unchanged. 2. Cardiomegaly with diffuse bilateral interstitial and heterogeneous airspace opacity, which may reflect edema or multifocal infection. Electronically Signed: By: Jearld Lesch M.D. On: 08/19/2021 15:00   DG Chest Port 1 View  Result Date: 08/19/2021 CLINICAL DATA:  PICC line placement EXAM: PORTABLE CHEST 1 VIEW COMPARISON:  08/16/2021 FINDINGS: Endotracheal tube has been withdrawn and is now 5 cm from carina in good position. Introduction of LEFT central venous line with tip at the cavoatrial junction versus RIGHT atrium. No pneumothorax. Persistent bilateral patchy airspace disease. IMPRESSION: 1. Central venous line with tip at the cavoatrial  junction versus RIGHT atrium. No pneumothorax. 2. Endotracheal tube and NG tube in good position. 3. Diffuse bilateral airspace disease again noted. Electronically Signed   By: Genevive Bi M.D.   On: 08/19/2021 14:23   DG Chest Port 1 View  Result Date: 08/16/2021 CLINICAL DATA:  Endotracheal tube present Z97.8 (ICD-10-CM) EXAM: PORTABLE CHEST 1 VIEW COMPARISON:  September 13, 2021. FINDINGS: Increased patchy interstitial  and airspace opacities throughout both lungs. Endotracheal tube tip approximately 8 mm above the carina. Gastric tube courses below diaphragm with the tip likely in the stomach. Side port is likely above the GE junction. Similar enlargement the cardiac silhouette. No visible pleural effusions or pneumothorax on this semi erect radiograph. Please see concurrent abdominal radiographs for abdominal evaluation. IMPRESSION: 1. Endotracheal tube tip approximately 8 mm above the carina. Recommend retraction. 2. Gastric tube courses below the diaphragm with the tip likely in the stomach. The side port is above the GE junction. If intragastric positioning of the side port is desired, recommend advancement. 3. Increased patchy interstitial and airspace opacities throughout both lungs, concerning for multifocal pneumonia better evaluated on recent CT chest. 4. Please see concurrent abdominal radiographs for abdominal evaluation. These results will be called to the ordering clinician or representative by the Radiologist Assistant, and communication documented in the PACS or [employer]. Electronically Signed   By: Feliberto Harts M.D.   On: 08/16/2021 15:42   DG Chest Portable 1 View  Result Date: 08/09/2021 CLINICAL DATA:  Breath and hypoxia. EXAM: PORTABLE CHEST 1 VIEW COMPARISON:  07/21/2021 FINDINGS: Heart size is normal. There are increased patchy infiltrates in the LOWER lobes bilaterally and the RIGHT lung apex. Findings are consistent with infectious/inflammatory process. Partially  imaged small bowel loops show mildly distended loops in the UPPER abdomen. No evidence for free intraperitoneal air. IMPRESSION: 1. Multifocal pulmonary infiltrates. 2. Question of small bowel dilatation. Consider two-view abdomen if needed. Electronically Signed   By: Norva Pavlov M.D.   On: 08/12/2021 13:40   DG C-Arm 1-60 Min  Result Date: 07/21/2021 CLINICAL DATA:  ORIF right femoral fracture EXAM: RIGHT FEMUR 2 VIEWS; DG C-ARM 1-60 MIN COMPARISON:  07/21/2021 FINDINGS: There are 13 fluoroscopic images obtained during the performance of the procedure and provided for interpretation only. Images demonstrate intramedullary rod with proximal and distal interlocking screws traversing the comminuted femoral fracture seen previously. No evidence of complication. Please refer to the operative report. FLUOROSCOPY TIME:  3 minutes 30 seconds IMPRESSION: 1. ORIF right femoral fracture. Electronically Signed   By: Sharlet Salina M.D.   On: 07/21/2021 18:33   ECHOCARDIOGRAM COMPLETE  Result Date: 08/20/2021    ECHOCARDIOGRAM REPORT   Patient Name:   Suzanne Avila Date of Exam: 08/20/2021 Medical Rec #:  [id number]        Height:       56.0 in Accession #:    [id number]       Weight:       105.6 lb Date of Birth:  [birth year]        BSA:          1.353 m Patient Age:    75 years         BP:           173/104 mmHg Patient Gender: F                HR:           105 bpm. Exam Location:  [facility] Procedure: 2D Echo, Cardiac Doppler and Color Doppler Indications:     Abnormal ECG R94.31  History:         Patient has prior history of Echocardiogram examinations, most                  recent 09/15/2020. COPD; Risk Factors:Hypertension and  Dyslipidemia.  Sonographer:     Cristela Blue Referring Phys:  [id number] DANA E GRAVES Diagnosing Phys: Arnoldo Hooker MD  Sonographer Comments: Suboptimal apical window and echo performed with patient supine and on artificial respirator. IMPRESSIONS  1. Left ventricular  ejection fraction, by estimation, is 55 to 60%. The left ventricle has normal function. The left ventricle has no regional wall motion abnormalities. Left ventricular diastolic parameters were normal.  2. Right ventricular systolic function is normal. The right ventricular size is normal.  3. Left atrial size was mildly dilated.  4. The mitral valve is normal in structure. Moderate mitral valve regurgitation. Moderate mitral annular calcification.  5. Tricuspid valve regurgitation is mild to moderate.  6. The aortic valve is normal in structure. Aortic valve regurgitation is trivial. FINDINGS  Left Ventricle: Left ventricular ejection fraction, by estimation, is 55 to 60%. The left ventricle has normal function. The left ventricle has no regional wall motion abnormalities. The left ventricular internal cavity size was normal in size. There is  no left ventricular hypertrophy. Left ventricular diastolic parameters were normal. Right Ventricle: The right ventricular size is normal. No increase in right ventricular wall thickness. Right ventricular systolic function is normal. Left Atrium: Left atrial size was mildly dilated. Right Atrium: Right atrial size was normal in size. Pericardium: There is no evidence of pericardial effusion. Mitral Valve: The mitral valve is normal in structure. Moderate mitral annular calcification. Moderate mitral valve regurgitation. MV peak gradient, 9.1 mmHg. The mean mitral valve gradient is 3.0 mmHg. Tricuspid Valve: The tricuspid valve is normal in structure. Tricuspid valve regurgitation is mild to moderate. Aortic Valve: The aortic valve is normal in structure. Aortic valve regurgitation is trivial. Aortic valve mean gradient measures 4.0 mmHg. Aortic valve peak gradient measures 6.5 mmHg. Aortic valve area, by VTI measures 1.43 cm. Pulmonic Valve: The pulmonic valve was normal in structure. Pulmonic valve regurgitation is trivial. Aorta: The aortic root and ascending aorta are  structurally normal, with no evidence of dilitation. IAS/Shunts: No atrial level shunt detected by color flow Doppler.  LEFT VENTRICLE PLAX 2D LVIDd:         4.20 cm   Diastology LVIDs:         2.80 cm   LV e' medial:    3.70 cm/s LV PW:         1.10 cm   LV E/e' medial:  29.2 LV IVS:        0.85 cm   LV e' lateral:   5.11 cm/s LVOT diam:     2.00 cm   LV E/e' lateral: 21.1 LV SV:         34 LV SV Index:   25 LVOT Area:     3.14 cm  RIGHT VENTRICLE RV Basal diam:  3.60 cm RV S prime:     13.40 cm/s TAPSE (M-mode): 3.2 cm LEFT ATRIUM           Index        RIGHT ATRIUM           Index LA diam:      3.50 cm 2.59 cm/m   RA Area:     17.10 cm LA Vol (A4C): 35.3 ml 26.09 ml/m  RA Volume:   46.30 ml  34.22 ml/m  AORTIC VALVE                    PULMONIC VALVE AV Area (Vmax):    1.48 cm  PV Vmax:        0.84 m/s AV Area (Vmean):   1.31 cm     PV Vmean:       56.350 cm/s AV Area (VTI):     1.43 cm     PV VTI:         0.169 m AV Vmax:           127.50 cm/s  PV Peak grad:   2.8 mmHg AV Vmean:          86.400 cm/s  PV Mean grad:   1.5 mmHg AV VTI:            0.239 m      RVOT Peak grad: 4 mmHg AV Peak Grad:      6.5 mmHg AV Mean Grad:      4.0 mmHg LVOT Vmax:         60.10 cm/s LVOT Vmean:        35.900 cm/s LVOT VTI:          0.109 m LVOT/AV VTI ratio: 0.46  AORTA Ao Root diam: 2.40 cm MITRAL VALVE                TRICUSPID VALVE MV Area (PHT): 3.15 cm     TR Peak grad:   46.0 mmHg MV Area VTI:   0.89 cm     TR Vmax:        339.00 cm/s MV Peak grad:  9.1 mmHg MV Mean grad:  3.0 mmHg     SHUNTS MV Vmax:       1.51 m/s     Systemic VTI:  0.11 m MV Vmean:      72.4 cm/s    Systemic Diam: 2.00 cm MV Decel Time: 241 msec     Pulmonic VTI:  0.202 m MV E velocity: 108.00 cm/s MV A velocity: 100.00 cm/s MV E/A ratio:  1.08 Arnoldo Hooker MD Electronically signed by Arnoldo Hooker MD Signature Date/Time: 08/20/2021/12:32:53 PM    Final    DG FEMUR, MIN 2 VIEWS RIGHT  Result Date: 07/21/2021 CLINICAL DATA:  ORIF right  femoral fracture EXAM: RIGHT FEMUR 2 VIEWS; DG C-ARM 1-60 MIN COMPARISON:  07/21/2021 FINDINGS: There are 13 fluoroscopic images obtained during the performance of the procedure and provided for interpretation only. Images demonstrate intramedullary rod with proximal and distal interlocking screws traversing the comminuted femoral fracture seen previously. No evidence of complication. Please refer to the operative report. FLUOROSCOPY TIME:  3 minutes 30 seconds IMPRESSION: 1. ORIF right femoral fracture. Electronically Signed   By: Sharlet Salina M.D.   On: 07/21/2021 18:33   DG FEMUR PORT, MIN 2 VIEWS RIGHT  Result Date: 07/21/2021 CLINICAL DATA:  ORIF right femoral fracture EXAM: RIGHT FEMUR PORTABLE 2 VIEW COMPARISON:  07/21/2021 FINDINGS: Frontal and cross-table lateral views of the right femur are obtained. Intramedullary rod with proximal and distal interlocking screws traverses a comminuted femoral diaphyseal fracture. Alignment is near anatomic. Postsurgical changes are seen in the soft tissues. IMPRESSION: 1. ORIF comminuted femoral diaphyseal fracture with near anatomic alignment. Electronically Signed   By: Sharlet Salina M.D.   On: 07/21/2021 18:34   [place] EKG SITE RITE  Result Date: 08/19/2021 If Site Rite image not attached, placement could not be confirmed due to current cardiac rhythm.  CT CHEST ABDOMEN PELVIS WO CONTRAST  Result Date: 08-31-21 CLINICAL DATA:  Nonlocalized abdominal pain. Multifocal pneumonia on chest x-ray. EXAM: CT CHEST, ABDOMEN AND PELVIS WITHOUT CONTRAST  TECHNIQUE: Multidetector CT imaging of the chest, abdomen and pelvis was performed following the standard protocol without IV contrast. COMPARISON:  Chest x-ray 29-Aug-2021, CT lumbar spine 06/15/2021, CT chest 12/22/2020, CT chest 07/19/2019 FINDINGS: CT CHEST FINDINGS Cardiovascular: Normal heart size. No significant pericardial effusion. The thoracic aorta is normal in caliber. Severe atherosclerotic plaque of the  thoracic aorta. Three-vessel coronary artery calcifications. Mediastinum/Nodes: No enlarged mediastinal, hilar, or axillary lymph nodes. Thyroid gland, trachea, and esophagus demonstrate no significant findings. Lungs/Pleura: Interval further decrease of a 0.9 x 0.6 cm (from 1.7 x 2.2 cm) right upper lobe cavitary lesion (4:43). Associated traction bronchiectasis. Interval development of scattered peribronchovascular patchy nodular-like consolidations and centrilobular nodules. No pleural effusion. No pneumothorax. Musculoskeletal: No chest wall abnormality. No suspicious lytic or blastic osseous lesions. No acute displaced fracture. Multilevel degenerative changes of the spine. CT ABDOMEN PELVIS FINDINGS Hepatobiliary: No focal liver abnormality. No definite gallstones, gallbladder wall thickening, or pericholecystic fluid. No biliary dilatation. Pancreas: No focal lesion. Normal pancreatic contour. No surrounding inflammatory changes. No main pancreatic ductal dilatation. Spleen: Normal in size without focal abnormality. Adrenals/Urinary Tract: No adrenal nodule bilaterally. Right renal atrophy. Left renal calcification likely vascular. No nephrolithiasis and no hydronephrosis. No definite contour-deforming renal mass. No ureterolithiasis or hydroureter. The urinary bladder is unremarkable. Stomach/Bowel: Stomach is within normal limits. The small bowel is distended with free fluid. No pneumatosis. No evidence of bowel wall thickening or dilatation. Appendix appears normal. Vascular/Lymphatic: No abdominal aorta or iliac aneurysm. Severe atherosclerotic plaque of the aorta and its branches. No abdominal, pelvic, or inguinal lymphadenopathy. Reproductive: Uterus and bilateral adnexa are unremarkable. Other: No intraperitoneal free fluid. No intraperitoneal free gas. No organized fluid collection. Musculoskeletal: No abdominal wall hernia or abnormality.  Diastasis rectus. No suspicious lytic or blastic osseous  lesions. Age-indeterminate compression fracture of the T4 vertebral body with greater than 80% height loss. Chronic, slightly worsened, T11 compression fracture with greater than 80% height loss. Multilevel degenerative changes of the spine with endplate sclerosis at the L2-L3 and L3-L4 level, multilevel intervertebral disc space vacuum phenomenon, diffusely decreased bone density. Status post L4-S1 posterolateral fusion. IMPRESSION: 1. Multifocal pneumonia, likely atypical. Non-contrast chest CT at 3-6 months is recommended. If the nodules are stable at time of repeat CT, then future CT at 18-24 months (from today's scan) is considered optional for low-risk patients, but is recommended for high-risk patients. This recommendation follows the consensus statement: Guidelines for Management of Incidental Pulmonary Nodules Detected on CT Images: From the Fleischner Society 2017; Radiology 2017; 284:228-243. 2. Interval further decrease of a 0.9 x 0.6 cm (from 1.7 x 2.2 cm) right upper lobe cavitary lesion that likely represents a resolving infection. 3. Nonspecific diffuse distension of the small bowel with fluid. Limited evaluation on this noncontrast study. 4. Age-indeterminate compression fracture of the T4 vertebral body with greater than 80% height loss. Chronic, slightly worsened, T11 compression fracture with greater than 80% height loss. Diffusely decreased bone density in a multilevel degenerative changes status post L4-S1 posterolateral fusion. 5.  Aortic Atherosclerosis (ICD10-I70.0). 6. Atrophic right kidney. Electronically Signed   By: Tish Frederickson M.D.   On: 08-29-21 15:41     Assessment/Plan 1.  Atherosclerotic occlusive disease bilateral lower extremities: At the present time given her critical condition secondary to her deteriorating respiratory status and cardiopulmonary issues no intervention is indicated at this time.  Her lower extremities are nonischemic and there does not appear to be  any problem adding to her other critical issues.  She does not appear to have any open wounds or sources of infection complicating her situation.  If she were to improve her vascular disease could be addressed as an outpatient.   Levora Dredge, MD  08/20/2021 3:38 PM

## 2021-08-20 NOTE — Progress Notes (Signed)
Suzanne Avila notified of patients D-Dimer and magnesium level. Magnesium was still infusing. She will add another lab draw. Continue to assess.

## 2021-08-21 DIAGNOSIS — R6521 Severe sepsis with septic shock: Secondary | ICD-10-CM | POA: Diagnosis not present

## 2021-08-21 DIAGNOSIS — A419 Sepsis, unspecified organism: Secondary | ICD-10-CM | POA: Diagnosis not present

## 2021-08-21 LAB — CBC
HCT: 22.4 % — ABNORMAL LOW (ref 36.0–46.0)
Hemoglobin: 7.8 g/dL — ABNORMAL LOW (ref 12.0–15.0)
MCH: 28.2 pg (ref 26.0–34.0)
MCHC: 34.8 g/dL (ref 30.0–36.0)
MCV: 80.9 fL (ref 80.0–100.0)
Platelets: 92 10*3/uL — ABNORMAL LOW (ref 150–400)
RBC: 2.77 MIL/uL — ABNORMAL LOW (ref 3.87–5.11)
RDW: 18.5 % — ABNORMAL HIGH (ref 11.5–15.5)
WBC: 10.3 10*3/uL (ref 4.0–10.5)
nRBC: 0 % (ref 0.0–0.2)

## 2021-08-21 LAB — BASIC METABOLIC PANEL
Anion gap: 7 (ref 5–15)
BUN: 28 mg/dL — ABNORMAL HIGH (ref 8–23)
CO2: 23 mmol/L (ref 22–32)
Calcium: 8.5 mg/dL — ABNORMAL LOW (ref 8.9–10.3)
Chloride: 108 mmol/L (ref 98–111)
Creatinine, Ser: 0.85 mg/dL (ref 0.44–1.00)
GFR, Estimated: >60 mL/min (ref >60)
Glucose, Bld: 168 mg/dL — ABNORMAL HIGH (ref 70–99)
Potassium: 3.2 mmol/L — ABNORMAL LOW (ref 3.5–5.1)
Sodium: 138 mmol/L (ref 135–145)

## 2021-08-21 LAB — BLOOD GAS, ARTERIAL
Acid-base deficit: 1 mmol/L (ref 0.0–2.0)
Bicarbonate: 21.8 mmol/L (ref 20.0–28.0)
FIO2: 24
MECHVT: 400 mL
O2 Saturation: 97.3 %
PEEP: 5 cmH2O
Patient temperature: 37
RATE: 16 resp/min
pCO2 arterial: 30 mmHg — ABNORMAL LOW (ref 32.0–48.0)
pH, Arterial: 7.47 — ABNORMAL HIGH (ref 7.350–7.450)
pO2, Arterial: 88 mmHg (ref 83.0–108.0)

## 2021-08-21 LAB — GLUCOSE, CAPILLARY
Glucose-Capillary: 105 mg/dL — ABNORMAL HIGH (ref 70–99)
Glucose-Capillary: 135 mg/dL — ABNORMAL HIGH (ref 70–99)
Glucose-Capillary: 147 mg/dL — ABNORMAL HIGH (ref 70–99)
Glucose-Capillary: 154 mg/dL — ABNORMAL HIGH (ref 70–99)
Glucose-Capillary: 74 mg/dL (ref 70–99)
Glucose-Capillary: 86 mg/dL (ref 70–99)

## 2021-08-21 LAB — MAGNESIUM: Magnesium: 1.4 mg/dL — ABNORMAL LOW (ref 1.7–2.4)

## 2021-08-21 LAB — PHOSPHORUS: Phosphorus: 3.2 mg/dL (ref 2.5–4.6)

## 2021-08-21 MED ORDER — ATORVASTATIN CALCIUM 20 MG PO TABS: 40.0000 mg | ORAL_TABLET | Freq: Every day | ORAL | Status: DC

## 2021-08-21 MED ORDER — JUVEN PO PACK
1.0000 | PACK | Freq: Two times a day (BID) | ORAL | Status: DC
  Administered 2021-08-21: 1

## 2021-08-21 MED ORDER — PANTOPRAZOLE SODIUM 40 MG PO TBEC: 40.0000 mg | DELAYED_RELEASE_TABLET | Freq: Every day | ORAL | Status: DC

## 2021-08-21 MED ORDER — LEVALBUTEROL HCL 0.63 MG/3ML IN NEBU: 0.6300 mg | INHALATION_SOLUTION | Freq: Four times a day (QID) | RESPIRATORY_TRACT | Status: DC | PRN

## 2021-08-21 MED ORDER — ATORVASTATIN CALCIUM 20 MG PO TABS
40.0000 mg | ORAL_TABLET | Freq: Every day | ORAL | Status: DC
  Administered 2021-08-21: 40 mg
  Filled 2021-08-21: qty 2

## 2021-08-21 MED ORDER — REVEFENACIN 175 MCG/3ML IN SOLN
175.0000 ug | Freq: Every day | RESPIRATORY_TRACT | Status: DC
  Administered 2021-08-21 – 2021-08-23 (×3): 175 ug via RESPIRATORY_TRACT
  Filled 2021-08-21 (×3): qty 3

## 2021-08-21 MED ORDER — PANTOPRAZOLE 2 MG/ML SUSPENSION
40.0000 mg | Freq: Every day | ORAL | Status: DC
  Administered 2021-08-21: 40 mg
  Filled 2021-08-21: qty 20

## 2021-08-21 MED ORDER — PREGABALIN 75 MG PO CAPS
150.0000 mg | ORAL_CAPSULE | Freq: Every day | ORAL | Status: DC
  Administered 2021-08-21: 150 mg via ORAL
  Filled 2021-08-21: qty 2

## 2021-08-21 MED ORDER — MIRTAZAPINE 15 MG PO TABS: 7.5000 mg | ORAL_TABLET | Freq: Every day | ORAL | Status: DC

## 2021-08-21 MED ORDER — POTASSIUM CHLORIDE 20 MEQ PO PACK
40.0000 meq | PACK | Freq: Once | ORAL | Status: AC
  Administered 2021-08-21: 40 meq
  Filled 2021-08-21: qty 2

## 2021-08-21 MED ORDER — FUROSEMIDE 10 MG/ML IJ SOLN
40.0000 mg | Freq: Once | INTRAMUSCULAR | Status: AC
  Administered 2021-08-21: 40 mg via INTRAVENOUS
  Filled 2021-08-21: qty 4

## 2021-08-21 MED ORDER — POTASSIUM CHLORIDE 10 MEQ/50ML IV SOLN
10.0000 meq | INTRAVENOUS | Status: AC
Start: 2021-08-21 — End: 2021-08-21
  Administered 2021-08-21 (×4): 10 meq via INTRAVENOUS
  Filled 2021-08-21 (×4): qty 50

## 2021-08-21 MED ORDER — ROPINIROLE HCL 1 MG PO TABS: 1.0000 mg | ORAL_TABLET | Freq: Every day | ORAL | Status: DC

## 2021-08-21 MED ORDER — ROPINIROLE HCL 1 MG PO TABS
1.0000 mg | ORAL_TABLET | Freq: Every day | ORAL | Status: DC
  Administered 2021-08-21 – 2021-08-25 (×5): 1 mg
  Filled 2021-08-21 (×6): qty 1

## 2021-08-21 MED ORDER — MAGNESIUM SULFATE 4 GM/100ML IV SOLN
4.0000 g | Freq: Once | INTRAVENOUS | Status: AC
  Administered 2021-08-21: 4 g via INTRAVENOUS
  Filled 2021-08-21: qty 100

## 2021-08-21 NOTE — TOC Initial Note (Signed)
Transition of Care [facility]) - Initial/Assessment Note    Patient Details  Name: SINEAD HOCKMAN MRN: [id number] Date of Birth: September 07, 1946  Transition of Care [facility]) CM/SW Contact:    Hetty Ely, RN Phone Number: 08/21/2021, 3:05 PM  Clinical Narrative:  TOCRN called and spoke with spouse Onalee Hua who says patient was in [place] [facility] [facility] getting rehab, however they received a call that Insurance is no longer paying and to come get her. Spouse says she was doing great "just like a spring chicken" then became non-verbal, would not eat nor walk so they called EMS and that's how she got here. Husband says Son lives in the home and assist with cooking and shopping along with family and friends, however they need more help with washing dishes and cleaning the house. Patient uses [facility] delivery for meds, patient ambulates with walker and also has a cane and a scooter at home. Patient was extubated this morning, TOC will continue to track.                 Expected Discharge Plan: Skilled Nursing Facility Barriers to Discharge: Continued Medical Work up   Patient Goals and CMS Choice        Expected Discharge Plan and Services Expected Discharge Plan: Skilled Nursing Facility In-house Referral: Clinical Social Work   Post Acute Care Choice: Home Health, Durable Medical Equipment Living arrangements for the past 2 months: Single Family Home                                      Prior Living Arrangements/Services Living arrangements for the past 2 months: Single Family Home Lives with:: Spouse Georgianna, Band (Spouse)   [phone] (Home Phone)) Patient language and need for interpreter reviewed:: Yes Do you feel safe going back to the place where you live?: Yes      Need for Family Participation in Patient Care: Yes (Comment) Care giver support system in place?: Yes (comment) Current home services: DME, Home OT, Home PT Criminal Activity/Legal Involvement Pertinent to  Current Situation/Hospitalization: No - Comment as needed  Activities of Daily Living Home Assistive Devices/Equipment: Dentures (specify type), Walker (specify type) ADL Screening (condition at time of admission) Patient's cognitive ability adequate to safely complete daily activities?: No Is the patient deaf or have difficulty hearing?: No Does the patient have difficulty seeing, even when wearing glasses/contacts?: No Does the patient have difficulty concentrating, remembering, or making decisions?: Yes Patient able to express need for assistance with ADLs?: No Does the patient have difficulty dressing or bathing?: Yes Independently performs ADLs?: No Communication: Independent Dressing (OT): Needs assistance Grooming: Independent with device (comment) Feeding: Independent with device (comment) Bathing: Needs assistance Toileting: Needs assistance In/Out Bed: Needs assistance Walks in Home: Independent with device (comment) Does the patient have difficulty walking or climbing stairs?: Yes Weakness of Legs: Both Weakness of Arms/Hands: Both  Permission Sought/Granted Permission sought to share information with : Family Supports Permission granted to share information with : Yes, Verbal Permission Granted  Share Information with NAME: Cristiana, Yochim (Spouse)   [phone] (Home Phone)           Emotional Assessment Appearance:: Appears stated age Attitude/Demeanor/Rapport: Unable to Assess Affect (typically observed): Unable to Assess   Alcohol / Substance Use: Not Applicable Psych Involvement: No (comment)  Admission diagnosis:  Abdominal distension [R14.0] Hypoxia [R09.02] Acute hypoxemic respiratory failure (HCC) [J96.01] Community acquired pneumonia, unspecified  laterality [J18.9] Patient Active Problem List   Diagnosis Date Noted   Acute metabolic acidosis 08/18/2021   Malnutrition of moderate degree 08/17/2021   Pressure injury of skin 08/16/2021   Acute  hypoxemic respiratory failure (HCC) 08/03/2021   Severe sepsis with septic shock (HCC) 08/04/2021   Femur fracture, right (HCC) 07/21/2021   Fall at home, initial encounter    AKI (acute kidney injury) (HCC)    Depression    Abnormal CT scan, lumbar spine 07/06/2021   Lumbar foraminal stenosis (Bilateral: L2-3 and L3-4) (L>R) 07/06/2021   Lumbosacral lateral recess stenosis (Bilateral: L3-4) (L>R) 07/06/2021   Chronic hip pain (Bilateral) (L>R) 06/22/2021   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 06/22/2021   Lumbosacral radiculopathy at L2 (Left) 06/01/2021   Impaired ambulation 06/01/2021   Underweight 05/14/2021   Chronic use of opiate for therapeutic purpose 01/26/2021   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   Cavitary lesion of lung 09/15/2020   AF (paroxysmal atrial fibrillation) (HCC) 09/15/2020   Failure to thrive in adult 09/15/2020   Severe protein-calorie malnutrition [name]: less than 60% of standard weight) (HCC) 09/15/2020   Dysphagia 09/15/2020   Hypotension 09/15/2020   Multifocal pneumonia 09/14/2020   Chronic low back pain (Left) w/o sciatica 09/10/2020   Pharmacologic therapy 05/05/2020   Chronic diarrhea 05/04/2020   DDD (degenerative disc disease), lumbosacral 11/19/2019   Therapeutic opioid-induced constipation (OIC) 08/01/2019   Chronic sacroiliac joint pain (Left) 05/01/2019   Chronic groin pain (Left) 05/01/2019   Spondylosis without myelopathy or radiculopathy, lumbosacral region 05/01/2019   Other specified dorsopathies, sacral and sacrococcygeal region 05/01/2019   Renal artery stenosis (HCC) 12/27/2017   Pain in limb 12/22/2017   Tobacco use disorder 12/22/2017   Renal atrophy, right 12/22/2017   Thoracic radiculitis 01/31/2017   Chronic lower extremity pain (1ry area of Pain) (Bilateral) (L>R) 04/25/2016   Chronic upper extremity  pain (3ry area of Pain) (Bilateral) (L>R) 04/25/2016   Disturbance of skin sensation 04/20/2016   Neurogenic pain 04/20/2016   Vitamin D deficiency 11/11/2015   Edema 09/02/2015   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   Long term current use of opiate analgesic 08/14/2015   Long term prescription opiate use 08/14/2015   Uncomplicated opioid dependence (HCC) 08/14/2015   Opiate use (30 MME/Day) 08/14/2015   Chronic low back pain (2ry area of Pain) (Bilateral) (L>R) w/ sciatica (Bilateral) 08/14/2015   Chronic pain syndrome 08/14/2015   Alcoholic (HCC) 08/14/2015   B12 deficiency 08/14/2015   Narrowing of intervertebral disc space 08/14/2015   History of metabolic disorder 08/14/2015   Fungal infection of toenail 08/14/2015   Osteopenia 08/14/2015   Compulsive tobacco user syndrome 08/14/2015   Osteoarthritis of hip (Left) 08/14/2015   Chronic hip pain (Left) 08/14/2015   Chronic lumbar radicular pain (L5 dermatome) (Bilateral) (L>R) 08/14/2015   Lumbar facet syndrome (Bilateral) (L>R) 08/14/2015   Lumbar spondylosis 08/14/2015   Lumbar Postlaminectomy syndrome (L4-5) 08/14/2015   Fibromyalgia 08/14/2015   COPD (chronic obstructive pulmonary disease) (HCC) 04/21/2015   Allergic rhinitis 02/10/2010   Atrial paroxysmal tachycardia (HCC) 07/06/2009   CAFL (chronic airflow limitation) (HCC) 07/06/2009   Acid reflux 07/06/2009   Glaucoma 07/06/2009   Essential hypertension 07/06/2009   Hypercholesterolemia without hypertriglyceridemia 07/06/2009   Absence of bladder continence 07/06/2009   PCP:  Jacky Kindle, FNP Pharmacy:   [facility] - [place], [place] -  [address] [place] [address] Phone: [phone] Fax: [phone]  [facility] - [place], [place] - [address] [place] [address] Phone: [phone] Fax: [phone]     Social Determinants of Health (SDOH)  Interventions    Readmission Risk Interventions Readmission Risk Prevention Plan 09/16/2020  Transportation Screening Complete  PCP or Specialist Appt within 3-5 Days Complete  HRI or Home Care Consult Complete  Social Work Consult for Recovery Care Planning/Counseling Complete  Palliative Care Screening Not Applicable  Medication Review Oceanographer) Complete  Some recent data might be hidden

## 2021-08-21 NOTE — Progress Notes (Signed)
NAME:  Suzanne Avila, MRN:  [id number], DOB:  12-30-1945, LOS: 5 ADMISSION DATE:  08-24-2021, CONSULTATION DATE:  08/16/21  History of Present Illness:  75yF with PMH as noted below who presented to the ED for dyspnea on 10/23. She had recently been discharged to SNF following hospitalization 9/28-10/3 for right femoral fracture repair. Since leaving SNF she'd had about 10d of diarrhea as well as constipation.   She required 2L nasal cannula in setting of her pneumonia. She was started on vanc/cefepime and cultured, given 1.5L saline bolus, started on saline maintenance fluids and overnight started on bicarb infusion for severe metabolic acidosis. Primary team reached out today for increased work of breathing not long after starting bicarb gtt, decreased responsiveness, gurgling/concern for aspiration.  Pertinent  Medical History  GERD Smoking COPD RUL cavitary lesion Recent right femoral fracture pAF  Significant Hospital Events: Including procedures, antibiotic start and stop dates in addition to other pertinent events   10/23 admitted 10/24 intubated in setting encephalopathy and acute hypoxic respiratory failure with multifocal pneumonia and likely ongoing aspiration, metabolic acidosis 10/24 US Renal Atrophic echogenic RIGHT kidney. Normal LEFT kidney. No hydronephrosis. Foley catheter in bladder 10/26 CT Chest/Abd/Pelvis revealed persistent and markedly progressive patchy multifocal pneumonia. Small bilateral pleural effusions with overlying atelectasis. Intra and extrahepatic biliary dilatation appears new or at least more obvious with contrast. No obvious common bile duct stones or pancreatic head mass. Recommend correlation with liver function studies. MRCP may be helpful for further evaluation if the patient can hold her breath. Suspect inflammatory changes involving the bowel as detailed above. No obstruction. Abnormally thickened endometrium worrisome for endometrial  carcinoma. Recommend follow-up pelvic ultrasound examination and consideration for endometrial sampling. Severe atherosclerotic calcifications involving the aorta and branch vessels including the coronary arteries. 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 Small right kidney with marked renal cortical atrophy likely due to chronic renal artery stenosis.  10/26 CT Head revealed no acute intracranial pathology. Small bilateral mastoid effusions 10/27 Failed SBT d/t increased WOB  Interim History / Subjective:  Remains on minimal ventilator settings. Alert and following commands this morning. Good urine output yesterday. HIT panel negative; still mildly thrombocytopenic. Procalcitonin significantly improved at 7.38 yesterday from >150 on 10/23.  Objective   Blood pressure 139/83, pulse 71, temperature 98.3 F (36.8 C), temperature source Axillary, resp. rate (!) 21, height 4\' 8"  (1.422 m), weight 48.3 kg, SpO2 100 %.    Vent Mode: PSV;CPAP FiO2 (%):  [24 %] 24 % Set Rate:  [16 bmp-22 bmp] 16 bmp Vt Set:  [400 mL] 400 mL PEEP:  [5 cmH20] 5 cmH20 Pressure Support:  [8 cmH20-10 cmH20] 8 cmH20 Plateau Pressure:  [14 cmH20-20 cmH20] 14 cmH20   Intake/Output Summary (Last 24 hours) at 08/21/2021 0744 Last data filed at 08/21/2021 0600 Gross per 24 hour  Intake 1745.18 ml  Output 3700 ml  Net -1954.82 ml   Filed Weights   08/19/21 0416 08/20/21 0347 08/21/21 0452  Weight: 45.2 kg 47.9 kg 48.3 kg    Examination: General: acutely on chronically ill appearing female, NAD resting in bed mechanically intubated  HENT: supple, no JVD  Lungs: mechanical breath sounds, otherwise clear Cardiovascular: RRR, no MRG Abdomen: NTND, NABS Neuro: RASS 0 to +1, following commands  Resolved Hospital Problem list   Septic shock Mild Hyperkalemia  Paroxysmal atrial fibrillation AKI  Assessment & Plan:   Acute hypoxic respiratory failure Multifocal pneumonia,  resolving COPD without exacerbation  Continue ventilator support & lung protective strategies Wean PEEP & FiO2 as tolerated, maintain SpO2 > 90% Head of bed elevated 30 degrees, VAP protocol in place Plateau pressures less than 30 cm H20  Intermittent chest x-ray & ABG PRN Daily WUA with SBT Ensure adequate pulmonary hygiene  Completed appropriate 5 day course of Unasyn  Incidental finding of 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 per CT Abd/Pelvis 10/26 Vascular Surgery consulted, follow up as outpatient DVT ppx with SQH  Leukocytosis Thrombocytopenia likely secondary to sepsis  Trend CBC  Monitor for s/sx of bleeding  If platelet count continues to decrease will discontinue subq heparin   Intra and extrahepatic biliary dilatation Liver enzymes are normal and condition improved will hold off on MRCP at this time Repeat RUQ U/S once extubated  Thickened endometrium worrisome for endometrial carcinoma Will need transvaginal US for further assessment  Cavitary lung lesion Chest CT showed improvement in cavitary lesion Pulmonary follow up post hospitalization   Best Practice (right click and "Reselect all SmartList Selections" daily)   Diet/type: NPO DVT prophylaxis: prophylactic heparin  GI prophylaxis: PPI Lines: N/A Foley:  Yes, and it is still needed Code Status:  full code Last date of multidisciplinary goals of care discussion [08/19/2021]   Critical Care Time: 35 minutes   Marcelo Baldy, MD 08/21/21 7:52 AM

## 2021-08-21 NOTE — Procedures (Signed)
Extubation Procedure Note  Patient Details:   Name: Suzanne Avila DOB: 1945-12-29 MRN: [id number]   Airway Documentation:    Vent end date: 08/21/21 Vent end time: 0857   Evaluation  O2 sats: stable throughout Complications: No apparent complications Patient did tolerate procedure well. Bilateral Breath Sounds: Rhonchi   Yes. Extubated to HFNC at 50/45%.  Cuff leak present prior to extubation.  Able to cough and speak.  Suzanne Avila 08/21/2021, 9:01 AM

## 2021-08-21 NOTE — Progress Notes (Signed)
0900 Extubated per RT.

## 2021-08-22 ENCOUNTER — Inpatient Hospital Stay: Payer: Medicare Other

## 2021-08-22 DIAGNOSIS — J189 Pneumonia, unspecified organism: Secondary | ICD-10-CM | POA: Diagnosis not present

## 2021-08-22 LAB — GLUCOSE, CAPILLARY
Glucose-Capillary: 79 mg/dL (ref 70–99)
Glucose-Capillary: 157 mg/dL — ABNORMAL HIGH (ref 70–99)
Glucose-Capillary: 53 mg/dL — ABNORMAL LOW (ref 70–99)
Glucose-Capillary: 230 mg/dL — ABNORMAL HIGH (ref 70–99)
Glucose-Capillary: 112 mg/dL — ABNORMAL HIGH (ref 70–99)
Glucose-Capillary: 112 mg/dL — ABNORMAL HIGH (ref 70–99)

## 2021-08-22 LAB — BASIC METABOLIC PANEL
Anion gap: 7 (ref 5–15)
BUN: 25 mg/dL — ABNORMAL HIGH (ref 8–23)
CO2: 25 mmol/L (ref 22–32)
Calcium: 8.2 mg/dL — ABNORMAL LOW (ref 8.9–10.3)
Chloride: 109 mmol/L (ref 98–111)
Creatinine, Ser: 0.71 mg/dL (ref 0.44–1.00)
GFR, Estimated: >60 mL/min (ref >60)
Glucose, Bld: 91 mg/dL (ref 70–99)
Potassium: 3.5 mmol/L (ref 3.5–5.1)
Sodium: 141 mmol/L (ref 135–145)

## 2021-08-22 LAB — CBC
HCT: 20.4 % — ABNORMAL LOW (ref 36.0–46.0)
Hemoglobin: 7.2 g/dL — ABNORMAL LOW (ref 12.0–15.0)
MCH: 29.6 pg (ref 26.0–34.0)
MCHC: 35.3 g/dL (ref 30.0–36.0)
MCV: 84 fL (ref 80.0–100.0)
Platelets: 102 10*3/uL — ABNORMAL LOW (ref 150–400)
RBC: 2.43 MIL/uL — ABNORMAL LOW (ref 3.87–5.11)
RDW: 18.6 % — ABNORMAL HIGH (ref 11.5–15.5)
WBC: 9.8 10*3/uL (ref 4.0–10.5)
nRBC: 0 % (ref 0.0–0.2)

## 2021-08-22 LAB — POTASSIUM: Potassium: 3.5 mmol/L (ref 3.5–5.1)

## 2021-08-22 LAB — MAGNESIUM
Magnesium: 1.9 mg/dL (ref 1.7–2.4)
Magnesium: 1.6 mg/dL — ABNORMAL LOW (ref 1.7–2.4)

## 2021-08-22 LAB — PHOSPHORUS
Phosphorus: 3.4 mg/dL (ref 2.5–4.6)
Phosphorus: 3.7 mg/dL (ref 2.5–4.6)

## 2021-08-22 MED ORDER — DILTIAZEM 12 MG/ML ORAL SUSPENSION
30.0000 mg | Freq: Four times a day (QID) | ORAL | Status: DC
  Administered 2021-08-22 – 2021-08-23 (×3): 30 mg via ORAL
  Filled 2021-08-22 (×9): qty 3

## 2021-08-22 MED ORDER — POTASSIUM CHLORIDE 20 MEQ PO PACK
40.0000 meq | PACK | Freq: Once | ORAL | Status: AC
  Administered 2021-08-22: 40 meq
  Filled 2021-08-22: qty 2

## 2021-08-22 MED ORDER — OSMOLITE 1.2 CAL PO LIQD
1000.0000 mL | ORAL | Status: DC
  Administered 2021-08-22 – 2021-08-24 (×3): 1000 mL

## 2021-08-22 MED ORDER — ATORVASTATIN CALCIUM 20 MG PO TABS
40.0000 mg | ORAL_TABLET | Freq: Every day | ORAL | Status: DC
  Administered 2021-08-22 – 2021-08-26 (×4): 40 mg
  Filled 2021-08-22 (×4): qty 2

## 2021-08-22 MED ORDER — ATORVASTATIN CALCIUM 20 MG PO TABS: 40.0000 mg | ORAL_TABLET | Freq: Every day | ORAL | Status: DC

## 2021-08-22 MED ORDER — MAGNESIUM SULFATE 4 GM/100ML IV SOLN
4.0000 g | Freq: Once | INTRAVENOUS | Status: AC
  Administered 2021-08-22: 4 g via INTRAVENOUS
  Filled 2021-08-22: qty 100

## 2021-08-22 MED ORDER — ONDANSETRON HCL 4 MG/2ML IJ SOLN
4.0000 mg | Freq: Four times a day (QID) | INTRAMUSCULAR | Status: DC | PRN
  Filled 2021-08-22: qty 2

## 2021-08-22 MED ORDER — ONDANSETRON HCL 4 MG PO TABS
4.0000 mg | ORAL_TABLET | Freq: Four times a day (QID) | ORAL | Status: DC | PRN
  Filled 2021-08-22: qty 1

## 2021-08-22 MED ORDER — DEXTROSE 50 % IV SOLN
INTRAVENOUS | Status: AC
  Filled 2021-08-22: qty 50

## 2021-08-22 MED ORDER — VITAL HIGH PROTEIN PO LIQD
1000.0000 mL | ORAL | Status: AC
Start: 2021-08-22 — End: 2021-08-22
  Administered 2021-08-22: 1000 mL

## 2021-08-22 MED ORDER — PANTOPRAZOLE 2 MG/ML SUSPENSION
40.0000 mg | Freq: Every day | ORAL | Status: DC
  Administered 2021-08-22 – 2021-08-26 (×4): 40 mg
  Filled 2021-08-22 (×4): qty 20

## 2021-08-22 MED ORDER — RAMIPRIL 2.5 MG PO CAPS
2.5000 mg | ORAL_CAPSULE | Freq: Every day | ORAL | Status: DC
  Administered 2021-08-22: 2.5 mg via ORAL
  Filled 2021-08-22 (×2): qty 1

## 2021-08-22 MED ORDER — DEXTROSE 50 % IV SOLN
25.0000 mL | Freq: Once | INTRAVENOUS | Status: AC
  Administered 2021-08-22: 25 mL via INTRAVENOUS

## 2021-08-22 NOTE — Progress Notes (Signed)
Patients glucose trending down this shift, 105, 86, 79. New order received to increase D10 to 25 ml/hr.

## 2021-08-22 NOTE — Progress Notes (Signed)
NAME:  Suzanne Avila, MRN:  [id number], DOB:  1946-03-07, LOS: 6 ADMISSION DATE:  08/10/2021, CONSULTATION DATE:  08/16/21  History of Present Illness:  75yF with PMH as noted below who presented to the ED for dyspnea on 10/23. She had recently been discharged to SNF following hospitalization 9/28-10/3 for right femoral fracture repair. Since leaving SNF she'd had about 10d of diarrhea as well as constipation.   She required 2L nasal cannula in setting of her pneumonia. She was started on vanc/cefepime and cultured, given 1.5L saline bolus, started on saline maintenance fluids and overnight started on bicarb infusion for severe metabolic acidosis. Primary team reached out today for increased work of breathing not long after starting bicarb gtt, decreased responsiveness, gurgling/concern for aspiration.  Pertinent  Medical History  GERD Smoking COPD RUL cavitary lesion, improved on most recent CT chest Recent right femoral fracture pAF  Significant Hospital Events: Including procedures, antibiotic start and stop dates in addition to other pertinent events   10/23 admitted 10/24 intubated in setting encephalopathy and acute hypoxic respiratory failure with multifocal pneumonia and likely ongoing aspiration, metabolic acidosis 10/24 US Renal Atrophic echogenic RIGHT kidney. Normal LEFT kidney. No hydronephrosis. Foley catheter in bladder 10/26 CT Chest/Abd/Pelvis revealed persistent and markedly progressive patchy multifocal pneumonia. Small bilateral pleural effusions with overlying atelectasis. Intra and extrahepatic biliary dilatation appears new or at least more obvious with contrast. No obvious common bile duct stones or pancreatic head mass. Recommend correlation with liver function studies. MRCP may be helpful for further evaluation if the patient can hold her breath. Suspect inflammatory changes involving the bowel as detailed above. No obstruction. Abnormally thickened endometrium  worrisome for endometrial carcinoma. Recommend follow-up pelvic ultrasound examination and consideration for endometrial sampling. Severe atherosclerotic calcifications involving the aorta and branch vessels including the coronary arteries. 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 Small right kidney with marked renal cortical atrophy likely due to chronic renal artery stenosis. 10/26 CT Head revealed no acute intracranial pathology. Small bilateral mastoid effusions 10/27 Failed SBT d/t increased WOB 10/29: extubated to [facility]  Interim History / Subjective:  Extubated to [facility] yesterday, transitioned to nasal cannula this morning. Negative fluid balance last 24 hours. H&H slowly downtrending, no evidence for bleeding. Platelets continue to improve. Requiring D10 infusion for hypoglycemia post-intubation as she is still NPO; mental status is much improved however; she is awaiting swallow eval.   Objective   Blood pressure 129/66, pulse 80, temperature 99.2 F (37.3 C), temperature source Oral, resp. rate (!) 26, height 4\' 8"  (1.422 m), weight 44.3 kg, SpO2 99 %.    FiO2 (%):  [35 %-45 %] 35 %   Intake/Output Summary (Last 24 hours) at 08/22/2021 0816 Last data filed at 08/22/2021 0700 Gross per 24 hour  Intake 839.73 ml  Output 2350 ml  Net -1510.27 ml   Filed Weights   08/20/21 0347 08/21/21 0452 08/22/21 0500  Weight: 47.9 kg 48.3 kg 44.3 kg    Examination: General: thin Caucasian female in NAD Lungs: CTA b/l, no W/C/R Cardiovascular: RRR, no MRG Abdomen: NTND, NABS Neuro: awake, alert and conversant  Resolved Hospital Problem list   Acute hypoxic respiratory failure Septic shock Mild Hyperkalemia  Paroxysmal atrial fibrillation AKI  Assessment & Plan:   Acute hypoxic respiratory failure, resolved Multifocal pneumonia, resolving COPD without exacerbation Extubated yesterday; wean nasal cannula as able Continue  Brovana/Yupelri Completed appropriate 5 day course of Unasyn  Incidental finding of 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 per CT Abd/Pelvis 10/26 Vascular Surgery consulted, follow up as outpatient DVT ppx with SQH  Anemia Thrombocytopenia likely secondary to sepsis  Trend H&H Monitor for s/sx of bleeding  Transfuse for Hgb<7  Hypoglycemia D10 infusion via PICC today; awaiting swallow eval  Intra and extrahepatic biliary dilatation Liver enzymes are normal and condition improved will hold off on MRCP at this time RUQ U/S today  Thickened endometrium worrisome for endometrial carcinoma Will need transvaginal US as outpatient for further assessment  Cavitary right apical lung lesion Chest CT showed significant improvement in cavitary lesion Pulmonary follow up post hospitalization  Best Practice (right click and "Reselect all SmartList Selections" daily)   Diet/type: NPO; swallow eval today DVT prophylaxis: prophylactic heparin  GI prophylaxis: PPI Lines: N/A Foley:  Yes, and it is no longer needed Code Status:  full code Last date of multidisciplinary goals of care discussion [08/19/2021]   Marcelo Baldy, MD 08/22/21 8:16 AM

## 2021-08-22 NOTE — Progress Notes (Signed)
Nutrition Follow-up  DOCUMENTATION CODES:   Non-severe (moderate) malnutrition in context of chronic illness  INTERVENTION:   -D/c Vital High Protein Initiate Osmolite 1.2 @ 20 ml/hr via NGT and increase by 10 ml every 8 hours to goal rate of 60 ml/hr.   80 ml free water flush every 6 hours  Tube feeding regimen provides 1728 kcal (100% of needs), 80 grams of protein, and 1180 ml of H2O. Total free water: 1500 ml daily  -Monitor Mg, K, and Phos daily and replete as needed secondary to high refeeding risk  NUTRITION DIAGNOSIS:   Moderate Malnutrition related to chronic illness (COPD) as evidenced by mild fat depletion, mild muscle depletion, moderate muscle depletion.  Ongoing  GOAL:   Patient will meet greater than or equal to 90% of their needs  Progressing   MONITOR:   Diet advancement, Labs, Weight trends, TF tolerance, Skin, I & O's  REASON FOR ASSESSMENT:   Consult Enteral/tube feeding initiation and management  ASSESSMENT:   75 y/o female with h/o chronic pain syndrome with opioid use, HTN, HLD, AF, COPD, depression, lung mass and recent hip fracture s/p repair 9/28 who is admitted with sepsis and PNA  10/24- intubated 10/29- extubated 10/30- s/p BSE- recommend NPO, NGT placed at bedside per RN- tip pf tube in the stomach per KUB  Reviewed I/O's: -1.5 L x 24 hours and +4.3 L since admission  UOP: 1.6 L x 24 hours  Rectal tube: 700 ml x 24 hours  Pt currently received Vital High Protein @ 20 ml/hr via NGT.  Medications reviewed and include 10% dextrose infusion @ 30 ml/hr.   Labs reviewed: CBGS: 157-230 (inpatient orders for glycemic control are none).    Diet Order:   Diet Order             Diet NPO time specified Except for: Ice Chips, Other (See Comments)  Diet effective now                   EDUCATION NEEDS:   Not appropriate for education at this time  Skin:  Skin Assessment: Skin Integrity Issues: Skin Integrity Issues:: Stage I,  Incisions Stage I: medial buttocks Incisions: closed rt thigh  Last BM:  08/22/21 (via rectal tube)  Height:   Ht Readings from Last 1 Encounters:  08/16/21 4\' 8"  (1.422 m)    Weight:   Wt Readings from Last 1 Encounters:  08/22/21 44.3 kg    Ideal Body Weight:  42.4 kg  BMI:  Body mass index is 21.9 kg/m.  Estimated Nutritional Needs:   Kcal:  1500-1700  Protein:  80-95 grams  Fluid:  > 1.5 L    08/24/21, RD, LDN, CDCES Registered Dietitian II Certified Diabetes Care and Education Specialist Please refer to [facility] for RD and/or RD on-call/weekend/after hours pager

## 2021-08-22 NOTE — Evaluation (Signed)
Clinical/Bedside Swallow Evaluation Patient Details  Name: Suzanne Avila MRN: [id number] Date of Birth: 1946-04-23  Today's Date: 08/22/2021 Time: SLP Start Time (ACUTE ONLY): 0846 SLP Stop Time (ACUTE ONLY): 0905 SLP Time Calculation (min) (ACUTE ONLY): 19 min  Past Medical History:  Past Medical History:  Diagnosis Date   Closed left arm fracture    COPD (chronic obstructive pulmonary disease) (HCC)    Dehydration 09/15/2020   Glaucoma    Hyperlipidemia    Hypertension    Lung mass    Past Surgical History:  Past Surgical History:  Procedure Laterality Date   BACK SURGERY     CARPAL TUNNEL RELEASE Left    EYE SURGERY     FEMUR IM NAIL Right 07/21/2021   Procedure: INTRAMEDULLARY (IM) RETROGRADE FEMORAL NAILING;  Surgeon: Kennedy Bucker, MD;  Location: [facility] ORS;  Service: Orthopedics;  Laterality: Right;   HPI:  Per Physician's H&P "75yF with PMH as noted below who presented to the ED for dyspnea on 10/23. She had recently been discharged to SNF following hospitalization 9/28-10/3 for right femoral fracture repair. Since leaving SNF she'd had about 10d of diarrhea as well as constipation.      She required 2L nasal cannula in setting of her pneumonia. She was started on vanc/cefepime and cultured, given 1.5L saline bolus, started on saline maintenance fluids and overnight started on bicarb infusion for severe metabolic acidosis. Primary team reached out today for increased work of breathing not long after starting bicarb gtt, decreased responsiveness, gurgling/concern for aspiration."   Most recent CXR, 08/20/21, "FINDINGS: Interval advancement of ET tube which is positioned approximately 3.0 cm from the carina. Unchanged position of left arm central venous line. Enteric tube tip is in the stomach.   Cardiac and mediastinal contours are unchanged. Unchanged patchy bilateral opacities. No large pleural effusion or evidence pneumothorax.   IMPRESSION: Interval advancement of  ET tube which is positioned approximately 3.0 cm from the carina."   Assessment / Plan / Recommendation  Clinical Impression  Pt seen for clinical swallowing evaluation. Pt awake, alert, and pleasantly confused. Mildly hoarse/hypophonic vocal quality appreciated. Pt on 8L/min O2 via HFNC. Concern for reduced secretion management as pt observed drooling x1. Dried, cracked lips noted and missing dention appreciated on oral motor exam. Pt endorsed wearing dentures; however, not in place at time of evaluation.   Pt presents with s/sx mild oral and highly suspected pharyngeal dysphagia. Oral phase notable for anterior loss of thin liquids via tsp on R due to mistimed labial closure. Suspect mental status affecting oral phase of swallow. Oral phase appeared functional for trials of ice chips and applesauce. Pt with immediate cough on x1 of 10 trials ice chips and prolonged/immediate coughing spell observed with teaspoon of water. No overt or subtle s/sx concerning for pharyngeal dysphagia noted with trials of applesauce (~3 oz consumed via teaspoon).   Given pt's clinical presentation and risk factors for aspiration/aspiration PNA (e.g. AMS, prolonged/recent intubation, dental status, respiratory status), recommend NPO x ice chips given in moderation for comfort and high priority medication given crushed with applesauce (non-oral means preferred).   SLP to f/u next date for clinical swallowing re-evaluation and readiness/appropriateness for MBS Study.  Pt and RN made aware of results, recommendations, and POC. Pt verbalized understanding; however, suspect need for reinforcement of content.  SLP Visit Diagnosis: Dysphagia, oropharyngeal phase (R13.12)    Aspiration Risk  Risk for inadequate nutrition/hydration;Severe aspiration risk    Diet Recommendation Ice chips PRN after  oral care;NPO except meds (critical/high priority medication may be given crushed in applesauce; prefer non-oral means)    Medication Administration: Crushed with puree (or via non-oral means) Supervision:  (staff to assist and supervise with ice chips and medication administration) Postural Changes: Seated upright at 90 degrees    Other  Recommendations Oral Care Recommendations: Oral care BID (oral care prior to ice chips)    Recommendations for follow up therapy are one component of a multi-disciplinary discharge planning process, led by the attending physician.  Recommendations may be updated based on patient status, additional functional criteria and insurance authorization.  Follow up Recommendations 24 hour supervision/assistance;Skilled Nursing facility      Frequency and Duration min 2x/week  2 weeks       Prognosis Prognosis for Safe Diet Advancement: Fair Barriers to Reach Goals: Severity of deficits      Swallow Study   General Date of Onset: 09-04-2021 HPI: Per Physician's H&P "75yF with PMH as noted below who presented to the ED for dyspnea on 10/23. She had recently been discharged to SNF following hospitalization 9/28-10/3 for right femoral fracture repair. Since leaving SNF she'd had about 10d of diarrhea as well as constipation.      She required 2L nasal cannula in setting of her pneumonia. She was started on vanc/cefepime and cultured, given 1.5L saline bolus, started on saline maintenance fluids and overnight started on bicarb infusion for severe metabolic acidosis. Primary team reached out today for increased work of breathing not long after starting bicarb gtt, decreased responsiveness, gurgling/concern for aspiration." Type of Study: Bedside Swallow Evaluation Previous Swallow Assessment: unknown Diet Prior to this Study: NPO (suspect consumed a regular diet with thin liquids PTA) Temperature Spikes Noted: Yes Respiratory Status:  (8L/min via HFNC) History of Recent Intubation: Yes Length of Intubations (days): 5 days Date extubated: 08/17/21 Behavior/Cognition:  Alert;Cooperative;Pleasant mood;Confused Oral Cavity Assessment: Within Functional Limits Oral Care Completed by SLP: Yes Oral Cavity - Dentition: Dentures, not available;Missing dentition Vision: Functional for self-feeding Self-Feeding Abilities: Total assist Patient Positioning: Upright in bed Baseline Vocal Quality: Low vocal intensity;Hoarse Volitional Cough: Weak Volitional Swallow: Able to elicit    Oral/Motor/Sensory Function     Ice Chips Ice chips: Impaired Presentation: Spoon Pharyngeal Phase Impairments: Cough - Immediate Other Comments: on 1/10 trials   Thin Liquid Thin Liquid: Impaired Oral Phase Impairments: Reduced labial seal Oral Phase Functional Implications: Right anterior spillage Pharyngeal  Phase Impairments: Multiple swallows;Cough - Immediate Other Comments: immediate/prolonged coughing spell on x1 trial; further trials deferred due to severity of coughing spell  Puree Puree: Within functional limits Presentation: Spoon Other Comments: ~3 oz via tsp       Clyde Canterbury, M.S., CCC-SLP         Alessandra Bevels Madoline Bhatt 08/22/2021,10:13 AM

## 2021-08-22 NOTE — Progress Notes (Signed)
Hypoglycemic Event  CBG: 53  Treatment: D50 25 mL (12.5 gm)  Symptoms: None  Follow-up CBG: [id number] CBG Result:157  Possible Reasons for Event: Inadequate meal intake  Comments/MD notified:new orders    Kavin Leech A

## 2021-08-22 NOTE — Evaluation (Signed)
Physical Therapy Evaluation Patient Details Name: Suzanne Avila MRN: [id number] DOB: 11-16-1945 Today's Date: 08/22/2021  History of Present Illness  Patient is a 75 year old female who was recently been discharged to SNF following hospitalization 9/28-10/3 for right femoral fracture repair. Patient presented to the hospital with acute difficulty breathing and required intubation in the setting of encephalopathy and acute hypoxic respiratory failure with multifocal pneumonia and likely ongoing aspiration, metabolic acidosis. Extubated on 10/30.  Clinical Impression  Patient is agreeable to PT evaluation. Decreased recall of recent events, however chart review indicates patient was recently at SNF with d/c to home and was recommended by ortho to be 25% weight bearing on RLE after recent right femoral fracture repair. Patient unable to state if weight bearing restrictions have been upgraded.  Patient currently requires assistance  with all mobility. Limited activity tolerance overall with generalized weakness. Patient declined standing and is fatigued with minimal activity. She does not appear to be at her baseline level of functional mobility. Recommend PT to maximize independence and facilitate return to prior level of function. SNF is recommended at discharge at this time.      Recommendations for follow up therapy are one component of a multi-disciplinary discharge planning process, led by the attending physician.  Recommendations may be updated based on patient status, additional functional criteria and insurance authorization.  Follow Up Recommendations Skilled nursing-short term rehab (<3 hours/day)    Assistance Recommended at Discharge Frequent or constant Supervision/Assistance  Functional Status Assessment Patient has had a recent decline in their functional status and demonstrates the ability to make significant improvements in function in a reasonable and predictable amount of time.   Equipment Recommendations  Other (comment) (to be determined at next level of care)    Recommendations for Other Services       Precautions / Restrictions Precautions Precautions: Fall Restrictions Weight Bearing Restrictions: Yes RLE Weight Bearing: Partial weight bearing RLE Partial Weight Bearing Percentage or Pounds: 25% Other Position/Activity Restrictions: per recent ortho notes from previous admission, PWB RLE 25%      Mobility  Bed Mobility Overal bed mobility: Needs Assistance Bed Mobility: Supine to Sit;Sit to Supine     Supine to sit: Max assist Sit to supine: Max assist   General bed mobility comments: assistance for LE and trunk support. increased time and effort required with mobility efforts. verbal cues for technique. no significant shortness of breath or change in vitals noted with mobility efforts    Transfers                   General transfer comment: patient declined to attempt standing this date, fatigued with sitting activity    Ambulation/Gait                Stairs            Wheelchair Mobility    Modified Rankin (Stroke Patients Only)       Balance Overall balance assessment: Needs assistance;History of Falls Sitting-balance support: Bilateral upper extremity supported Sitting balance-Leahy Scale: Fair Sitting balance - Comments: patient relying on UE support to maintain sitting balance, fatigued with activity with more pronounced posterior lean with increased sitting time Postural control: Posterior lean                                   Pertinent Vitals/Pain Pain Assessment: No/denies pain    Home Living Family/patient  expects to be discharged to:: Private residence Living Arrangements: Spouse/significant other Available Help at Discharge: Family;Available 24 hours/day Type of Home: House Home Access: Level entry;Ramped entrance       Home Layout: One level Home Equipment: Agricultural consultant  (2 wheels);Rollator (4 wheels) Additional Comments: patient with recent discharge from SNF to home. she reports she was ambulatory with rolling walker    Prior Function Prior Level of Function : Patient poor historian/Family not available             Mobility Comments: per patient report she was independent with mobility, Mod I with rolling walker. no family in room to confirm       Hand Dominance   Dominant Hand: Left    Extremity/Trunk Assessment   Upper Extremity Assessment Upper Extremity Assessment: Generalized weakness    Lower Extremity Assessment Lower Extremity Assessment: Generalized weakness;RLE deficits/detail RLE Deficits / Details: stiffness with AAROM, generalized weakness throughout RLE Sensation: history of peripheral neuropathy       Communication   Communication: No difficulties  Cognition Arousal/Alertness: Awake/alert Behavior During Therapy: WFL for tasks assessed/performed Overall Cognitive Status: No family/caregiver present to determine baseline cognitive functioning                                 General Comments: patient with decreased awareness of recent events. oriented to person, place, day of the week. increased time required for motor planning and process. poor historian overall        General Comments      Exercises Total Joint Exercises Heel Slides: AAROM;Strengthening;Right;10 reps;Supine Hip ABduction/ADduction: AAROM;Strengthening;Right;10 reps;Supine Other Exercises Other Exercises: verbal and visual cues for exercise technique for strengthening   Assessment/Plan    PT Assessment Patient needs continued PT services  PT Problem List Decreased strength;Decreased range of motion;Decreased activity tolerance;Decreased balance;Decreased mobility;Decreased cognition;Decreased safety awareness       PT Treatment Interventions DME instruction;Gait training;Functional mobility training;Stair training;Therapeutic  activities;Therapeutic exercise;Balance training;Neuromuscular re-education;Cognitive remediation;Patient/family education    PT Goals (Current goals can be found in the Care Plan section)  Acute Rehab PT Goals Patient Stated Goal: to get out of bed PT Goal Formulation: With patient Time For Goal Achievement: 09/05/21 Potential to Achieve Goals: Fair    Frequency Min 2X/week   Barriers to discharge        Co-evaluation               AM-PAC PT "6 Clicks" Mobility  Outcome Measure Help needed turning from your back to your side while in a flat bed without using bedrails?: A Lot Help needed moving from lying on your back to sitting on the side of a flat bed without using bedrails?: A Lot Help needed moving to and from a bed to a chair (including a wheelchair)?: A Lot Help needed standing up from a chair using your arms (e.g., wheelchair or bedside chair)?: A Lot Help needed to walk in hospital room?: A Lot Help needed climbing 3-5 steps with a railing? : Total 6 Click Score: 11    End of Session Equipment Utilized During Treatment: Oxygen Activity Tolerance: Patient limited by fatigue Patient left: in bed;with call bell/phone within reach;with bed alarm set;with SCD's reapplied Nurse Communication: Mobility status PT Visit Diagnosis: Unsteadiness on feet (R26.81);Other abnormalities of gait and mobility (R26.89);Muscle weakness (generalized) (M62.81)    Time: 6720-9470 PT Time Calculation (min) (ACUTE ONLY): 18 min   Charges:  PT Evaluation $PT Eval Moderate Complexity: 1 Mod PT Treatments $Therapeutic Activity: 8-22 mins        Donna Bernard, PT, MPT   Ina Homes 08/22/2021, 10:42 AM

## 2021-08-22 NOTE — Progress Notes (Signed)
1200 Dobb Hoff tube advanced 10 cm per MD order.

## 2021-08-23 ENCOUNTER — Encounter: Payer: Medicare Other | Admitting: Pain Medicine

## 2021-08-23 ENCOUNTER — Inpatient Hospital Stay: Payer: Medicare Other

## 2021-08-23 DIAGNOSIS — J189 Pneumonia, unspecified organism: Secondary | ICD-10-CM | POA: Diagnosis not present

## 2021-08-23 LAB — MAGNESIUM: Magnesium: 2.7 mg/dL — ABNORMAL HIGH (ref 1.7–2.4)

## 2021-08-23 LAB — BASIC METABOLIC PANEL
Anion gap: 7 (ref 5–15)
BUN: 21 mg/dL (ref 8–23)
CO2: 24 mmol/L (ref 22–32)
Calcium: 8.3 mg/dL — ABNORMAL LOW (ref 8.9–10.3)
Chloride: 112 mmol/L — ABNORMAL HIGH (ref 98–111)
Creatinine, Ser: 0.68 mg/dL (ref 0.44–1.00)
GFR, Estimated: >60 mL/min (ref >60)
Glucose, Bld: 206 mg/dL — ABNORMAL HIGH (ref 70–99)
Potassium: 4.8 mmol/L (ref 3.5–5.1)
Sodium: 143 mmol/L (ref 135–145)

## 2021-08-23 LAB — CBC
HCT: 25.8 % — ABNORMAL LOW (ref 36.0–46.0)
Hemoglobin: 8.7 g/dL — ABNORMAL LOW (ref 12.0–15.0)
MCH: 29.1 pg (ref 26.0–34.0)
MCHC: 33.7 g/dL (ref 30.0–36.0)
MCV: 86.3 fL (ref 80.0–100.0)
Platelets: 169 10*3/uL (ref 150–400)
RBC: 2.99 MIL/uL — ABNORMAL LOW (ref 3.87–5.11)
RDW: 19 % — ABNORMAL HIGH (ref 11.5–15.5)
WBC: 12.6 10*3/uL — ABNORMAL HIGH (ref 4.0–10.5)
nRBC: 0 % (ref 0.0–0.2)

## 2021-08-23 LAB — BLOOD GAS, ARTERIAL
Acid-Base Excess: 1.9 mmol/L (ref 0.0–2.0)
Bicarbonate: 26.6 mmol/L (ref 20.0–28.0)
FIO2: 0.4
MECHVT: 400 mL
Mechanical Rate: 16
O2 Saturation: 97.8 %
PEEP: 5 cmH2O
Patient temperature: 37
RATE: 16 resp/min
pCO2 arterial: 41 mmHg (ref 32.0–48.0)
pH, Arterial: 7.42 (ref 7.350–7.450)
pO2, Arterial: 99 mmHg (ref 83.0–108.0)

## 2021-08-23 LAB — GLUCOSE, CAPILLARY
Glucose-Capillary: 113 mg/dL — ABNORMAL HIGH (ref 70–99)
Glucose-Capillary: 118 mg/dL — ABNORMAL HIGH (ref 70–99)
Glucose-Capillary: 148 mg/dL — ABNORMAL HIGH (ref 70–99)
Glucose-Capillary: 174 mg/dL — ABNORMAL HIGH (ref 70–99)
Glucose-Capillary: 193 mg/dL — ABNORMAL HIGH (ref 70–99)
Glucose-Capillary: 197 mg/dL — ABNORMAL HIGH (ref 70–99)
Glucose-Capillary: 218 mg/dL — ABNORMAL HIGH (ref 70–99)

## 2021-08-23 LAB — PHOSPHORUS: Phosphorus: 4.2 mg/dL (ref 2.5–4.6)

## 2021-08-23 LAB — PROCALCITONIN: Procalcitonin: 1.08 ng/mL

## 2021-08-23 MED ORDER — MIDAZOLAM HCL 2 MG/2ML IJ SOLN
1.0000 mg | INTRAMUSCULAR | Status: DC | PRN
  Administered 2021-08-24 (×2): 1 mg via INTRAVENOUS
  Administered 2021-08-24: 2 mg via INTRAVENOUS
  Filled 2021-08-23 (×3): qty 2

## 2021-08-23 MED ORDER — ETOMIDATE 2 MG/ML IV SOLN
20.0000 mg | Freq: Once | INTRAVENOUS | Status: AC
  Administered 2021-08-23: 20 mg via INTRAVENOUS
  Filled 2021-08-23: qty 10

## 2021-08-23 MED ORDER — DILTIAZEM HCL 25 MG/5ML IV SOLN
10.0000 mg | Freq: Four times a day (QID) | INTRAVENOUS | Status: DC | PRN
  Administered 2021-08-23 – 2021-08-25 (×4): 10 mg via INTRAVENOUS
  Filled 2021-08-23 (×4): qty 5

## 2021-08-23 MED ORDER — FENTANYL 2500MCG IN NS 250ML (10MCG/ML) PREMIX INFUSION
25.0000 ug/h | INTRAVENOUS | Status: DC
  Administered 2021-08-23: 25 ug/h via INTRAVENOUS
  Administered 2021-08-24 – 2021-08-26 (×5): 200 ug/h via INTRAVENOUS
  Filled 2021-08-23 (×6): qty 250

## 2021-08-23 MED ORDER — IPRATROPIUM-ALBUTEROL 0.5-2.5 (3) MG/3ML IN SOLN
3.0000 mL | RESPIRATORY_TRACT | Status: DC
  Administered 2021-08-23 – 2021-08-26 (×18): 3 mL via RESPIRATORY_TRACT
  Filled 2021-08-23 (×20): qty 3

## 2021-08-23 MED ORDER — METHYLPREDNISOLONE SODIUM SUCC 40 MG IJ SOLR
20.0000 mg | Freq: Two times a day (BID) | INTRAMUSCULAR | Status: DC
  Administered 2021-08-23 – 2021-08-26 (×6): 20 mg via INTRAVENOUS
  Filled 2021-08-23 (×6): qty 1

## 2021-08-23 MED ORDER — FENTANYL CITRATE PF 50 MCG/ML IJ SOSY: 25.0000 ug | PREFILLED_SYRINGE | Freq: Once | INTRAMUSCULAR | Status: DC

## 2021-08-23 MED ORDER — DOCUSATE SODIUM 50 MG/5ML PO LIQD
100.0000 mg | Freq: Two times a day (BID) | ORAL | Status: DC
  Administered 2021-08-23 – 2021-08-26 (×5): 100 mg
  Filled 2021-08-23 (×5): qty 10

## 2021-08-23 MED ORDER — FENTANYL CITRATE PF 50 MCG/ML IJ SOSY
50.0000 ug | PREFILLED_SYRINGE | Freq: Once | INTRAMUSCULAR | Status: AC
Start: 2021-08-23 — End: 2021-08-23
  Administered 2021-08-23: 50 ug via INTRAVENOUS
  Filled 2021-08-23: qty 1

## 2021-08-23 MED ORDER — FUROSEMIDE 10 MG/ML IJ SOLN
40.0000 mg | Freq: Once | INTRAMUSCULAR | Status: AC
  Administered 2021-08-23: 40 mg via INTRAVENOUS
  Filled 2021-08-23: qty 4

## 2021-08-23 MED ORDER — ROCURONIUM BROMIDE 10 MG/ML (PF) SYRINGE
40.0000 mg | PREFILLED_SYRINGE | Freq: Once | INTRAVENOUS | Status: AC
  Administered 2021-08-23: 40 mg via INTRAVENOUS
  Filled 2021-08-23: qty 10

## 2021-08-23 MED ORDER — METHYLPREDNISOLONE SODIUM SUCC 125 MG IJ SOLR: 60.0000 mg | Freq: Two times a day (BID) | INTRAMUSCULAR | Status: DC

## 2021-08-23 MED ORDER — VANCOMYCIN HCL IN DEXTROSE 1-5 GM/200ML-% IV SOLN
1000.0000 mg | Freq: Once | INTRAVENOUS | Status: AC
  Administered 2021-08-23: 1000 mg via INTRAVENOUS
  Filled 2021-08-23: qty 200

## 2021-08-23 MED ORDER — FENTANYL BOLUS VIA INFUSION
25.0000 ug | INTRAVENOUS | Status: DC | PRN
  Administered 2021-08-23 – 2021-08-24 (×8): 50 ug via INTRAVENOUS
  Filled 2021-08-23: qty 100

## 2021-08-23 MED ORDER — SODIUM CHLORIDE 0.9 % IV SOLN
2.0000 g | Freq: Two times a day (BID) | INTRAVENOUS | Status: DC
  Administered 2021-08-23 – 2021-08-24 (×3): 2 g via INTRAVENOUS
  Filled 2021-08-23 (×4): qty 2

## 2021-08-23 MED ORDER — VANCOMYCIN HCL 750 MG/150ML IV SOLN
750.0000 mg | INTRAVENOUS | Status: DC
  Administered 2021-08-24: 750 mg via INTRAVENOUS
  Filled 2021-08-23: qty 150

## 2021-08-23 MED ORDER — METHYLPREDNISOLONE SODIUM SUCC 125 MG IJ SOLR
60.0000 mg | INTRAMUSCULAR | Status: AC
  Administered 2021-08-23: 60 mg via INTRAVENOUS
  Filled 2021-08-23: qty 2

## 2021-08-23 MED ORDER — MORPHINE SULFATE (PF) 2 MG/ML IV SOLN
2.0000 mg | Freq: Once | INTRAVENOUS | Status: AC
  Administered 2021-08-23: 2 mg via INTRAVENOUS
  Filled 2021-08-23: qty 1

## 2021-08-23 MED ORDER — INSULIN ASPART 100 UNIT/ML IJ SOLN
0.0000 [IU] | INTRAMUSCULAR | Status: DC
  Administered 2021-08-23: 2 [IU] via SUBCUTANEOUS
  Administered 2021-08-23: 5 [IU] via SUBCUTANEOUS
  Administered 2021-08-23: 3 [IU] via SUBCUTANEOUS
  Administered 2021-08-24: 5 [IU] via SUBCUTANEOUS
  Administered 2021-08-24: 3 [IU] via SUBCUTANEOUS
  Administered 2021-08-24: 5 [IU] via SUBCUTANEOUS
  Administered 2021-08-24: 8 [IU] via SUBCUTANEOUS
  Administered 2021-08-24: 5 [IU] via SUBCUTANEOUS
  Administered 2021-08-24: 3 [IU] via SUBCUTANEOUS
  Administered 2021-08-24: 5 [IU] via SUBCUTANEOUS
  Administered 2021-08-25: 2 [IU] via SUBCUTANEOUS
  Administered 2021-08-25 (×2): 3 [IU] via SUBCUTANEOUS
  Administered 2021-08-25: 5 [IU] via SUBCUTANEOUS
  Administered 2021-08-25: 2 [IU] via SUBCUTANEOUS
  Administered 2021-08-26 (×3): 3 [IU] via SUBCUTANEOUS
  Administered 2021-08-26: 2 [IU] via SUBCUTANEOUS
  Administered 2021-08-26: 3 [IU] via SUBCUTANEOUS
  Filled 2021-08-23 (×20): qty 1

## 2021-08-23 MED ORDER — POLYETHYLENE GLYCOL 3350 17 G PO PACK
17.0000 g | PACK | Freq: Every day | ORAL | Status: DC
  Administered 2021-08-25 – 2021-08-26 (×2): 17 g
  Filled 2021-08-23 (×2): qty 1

## 2021-08-23 MED ORDER — BUDESONIDE 0.25 MG/2ML IN SUSP
0.2500 mg | Freq: Two times a day (BID) | RESPIRATORY_TRACT | Status: DC
  Administered 2021-08-23 – 2021-08-26 (×7): 0.25 mg via RESPIRATORY_TRACT
  Filled 2021-08-23 (×6): qty 2

## 2021-08-23 MED ORDER — MIDAZOLAM HCL 2 MG/2ML IJ SOLN
1.0000 mg | INTRAMUSCULAR | Status: AC | PRN
  Administered 2021-08-23 (×3): 1 mg via INTRAVENOUS
  Filled 2021-08-23 (×3): qty 2

## 2021-08-23 NOTE — Progress Notes (Signed)
GOALS OF CARE DISCUSSION  The Clinical status was relayed to family in detail. Son and Husband at bedside Updated and notified of patients medical condition.    Patient with increased WOB and using accessory muscles to breathe Explained to family course of therapy and the modalities  She has stated to family she would like to be placed on VENT Plan for emergent intubation  Patient with Progressive multiorgan failure with a very high probablity of a very minimal chance of meaningful recovery despite all aggressive and optimal medical therapy.  PATIENT REMAINS FULL CODE  Family understands the situation.  Family are satisfied with Plan of action and management. All questions answered  Additional CC time 35 mins   Breck Hollinger Santiago Glad, M.D.  [facility]  Medical Director [facility] Medical Director [facility] Cardio-Pulmonary Department

## 2021-08-23 NOTE — NC FL2 (Signed)
[place] LEVEL OF CARE SCREENING TOOL     IDENTIFICATION  Patient Name: Suzanne Avila Birthdate: [birth year] Sex: female Admission Date (Current Location): 07/25/2021  [facility] and [place] Number:  Engineering geologist and Address:  [facility], [address] [place], [place] [address]      Provider Number: [id number]  Attending Physician Name and Address:  Flora Lipps, MD  Relative Name and Phone Number:  Lilu, Mcglown (Spouse)   [phone] (Home Phone)    Current Level of Care: Hospital Recommended Level of Care: [place] Prior Approval Number:    Date Approved/Denied:   PASRR Number: [id number]  Discharge Plan: SNF    Current Diagnoses: Patient Active Problem List   Diagnosis Date Noted   Acute metabolic acidosis 74/82/7078   Malnutrition of moderate degree 08/17/2021   Pressure injury of skin 08/16/2021   Acute hypoxemic respiratory failure ([place]) 07/27/2021   Severe sepsis with septic shock ([place]) 08/10/2021   Femur fracture, right ([place]) 07/21/2021   Fall at home, initial encounter    AKI (acute kidney injury) ([place])    Depression    Abnormal CT scan, lumbar spine 07/06/2021   Lumbar foraminal stenosis (Bilateral: L2-3 and L3-4) (L>R) 07/06/2021   Lumbosacral lateral recess stenosis (Bilateral: L3-4) (L>R) 07/06/2021   Chronic hip pain (Bilateral) (L>R) 06/22/2021   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 06/22/2021   Lumbosacral radiculopathy at L2 (Left) 06/01/2021   Impaired ambulation 06/01/2021   Underweight 05/14/2021   Chronic use of opiate for therapeutic purpose 01/26/2021   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   Avascular necrosis of femoral head (Left) ([place]) 11/02/2020   Cavitary lesion of lung 09/15/2020   AF (paroxysmal atrial fibrillation) ([place]) 09/15/2020   Failure to thrive in adult 09/15/2020   Severe protein-calorie  malnutrition Altamease Oiler: less than 60% of standard weight) ([place]) 09/15/2020   Dysphagia 09/15/2020   Hypotension 09/15/2020   Multifocal pneumonia 09/14/2020   Chronic low back pain (Left) w/o sciatica 09/10/2020   Pharmacologic therapy 05/05/2020   Chronic diarrhea 05/04/2020   DDD (degenerative disc disease), lumbosacral 11/19/2019   Therapeutic opioid-induced constipation (OIC) 08/01/2019   Chronic sacroiliac joint pain (Left) 05/01/2019   Chronic groin pain (Left) 05/01/2019   Spondylosis without myelopathy or radiculopathy, lumbosacral region 05/01/2019   Other specified dorsopathies, sacral and sacrococcygeal region 05/01/2019   Renal artery stenosis ([place]) 12/27/2017   Pain in limb 12/22/2017   Tobacco use disorder 12/22/2017   Renal atrophy, right 12/22/2017   Thoracic radiculitis 01/31/2017   Chronic lower extremity pain (1ry area of Pain) (Bilateral) (L>R) 04/25/2016   Chronic upper extremity pain (3ry area of Pain) (Bilateral) (L>R) 04/25/2016   Disturbance of skin sensation 04/20/2016   Neurogenic pain 04/20/2016   Vitamin D deficiency 11/11/2015   Edema 09/02/2015   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   Long term current use of opiate analgesic 08/14/2015   Long term prescription opiate use 67/54/4920   Uncomplicated opioid dependence ([place]) 08/14/2015   Opiate use (30 MME/Day) 08/14/2015   Chronic low back pain (2ry area of Pain) (Bilateral) (L>R) w/ sciatica (Bilateral) 08/14/2015   Chronic pain syndrome 07/30/1218   Alcoholic ([place]) 75/88/3254   B12 deficiency 08/14/2015   Narrowing of intervertebral disc space 08/14/2015   History of metabolic disorder 98/26/4158   Fungal infection of toenail 08/14/2015   Osteopenia 08/14/2015   Compulsive tobacco user syndrome 08/14/2015   Osteoarthritis  of hip (Left) 08/14/2015   Chronic hip pain (Left) 08/14/2015   Chronic lumbar radicular pain (L5 dermatome) (Bilateral) (L>R) 08/14/2015   Lumbar facet syndrome  (Bilateral) (L>R) 08/14/2015   Lumbar spondylosis 08/14/2015   Lumbar Postlaminectomy syndrome (L4-5) 08/14/2015   Fibromyalgia 08/14/2015   COPD (chronic obstructive pulmonary disease) (HCC) 04/21/2015   Allergic rhinitis 02/10/2010   Atrial paroxysmal tachycardia (HCC) 07/06/2009   CAFL (chronic airflow limitation) (HCC) 07/06/2009   Acid reflux 07/06/2009   Glaucoma 07/06/2009   Essential hypertension 07/06/2009   Hypercholesterolemia without hypertriglyceridemia 07/06/2009   Absence of bladder continence 07/06/2009    Orientation RESPIRATION BLADDER Height & Weight     Self, Place, Situation  Normal Continent Weight: 95 lb 14.4 oz (43.5 kg) Height:  $Remove'4\' 8"'zhReDnA$  (142.2 cm)  BEHAVIORAL SYMPTOMS/MOOD NEUROLOGICAL BOWEL NUTRITION STATUS      Continent Feeding tube, Diet  AMBULATORY STATUS COMMUNICATION OF NEEDS Skin     Verbally Normal                       Personal Care Assistance Level of Assistance    Bathing Assistance: Limited assistance Feeding assistance: Limited assistance Dressing Assistance: Limited assistance Total Care Assistance: Maximum assistance   Functional Limitations Info  Speech, Hearing, Sight Sight Info: Adequate Hearing Info: Adequate Speech Info: Adequate    SPECIAL CARE FACTORS FREQUENCY  PT (By licensed PT), OT (By licensed OT)     PT Frequency: 5X per week OT Frequency: 5X per week            Contractures Contractures Info: Not present    Additional Factors Info        Moderna COVID-19 Vaccine 11/03/2020 , 01/30/2020 , 01/02/2020             Current Medications (08/23/2021):  This is the current hospital active medication list Current Facility-Administered Medications  Medication Dose Route Frequency Provider Last Rate Last Admin   0.9 %  sodium chloride infusion  250 mL Intravenous Continuous Milus Banister, NP 10 mL/hr at 08/23/21 0700 Infusion Verify at 08/23/21 0700   atorvastatin (LIPITOR) tablet 40 mg  40 mg Per Tube Daily  Bennie Pierini, MD   40 mg at 08/22/21 1335   budesonide (PULMICORT) nebulizer solution 0.25 mg  0.25 mg Nebulization BID Flora Lipps, MD       ceFEPIme (MAXIPIME) 2 g in sodium chloride 0.9 % 100 mL IVPB  2 g Intravenous Q12H BeersShanon Brow, RPH 200 mL/hr at 08/23/21 0955 2 g at 08/23/21 0955   chlorhexidine gluconate (MEDLINE KIT) (PERIDEX) 0.12 % solution 15 mL  15 mL Mouth Rinse BID Shahmehdi, Seyed A, MD   15 mL at 08/22/21 2000   Chlorhexidine Gluconate Cloth 2 % PADS 6 each  6 each Topical Daily Sharen Hones, MD   6 each at 08/22/21 1440   diltiazem (CARDIZEM) injection 10 mg  10 mg Intravenous Q6H PRN Flora Lipps, MD       feeding supplement (OSMOLITE 1.2 CAL) liquid 1,000 mL  1,000 mL Per Tube Continuous Bennie Pierini, MD 60 mL/hr at 08/23/21 0523 Rate Change at 08/23/21 0523   furosemide (LASIX) injection 40 mg  40 mg Intravenous Once Milus Banister, NP       heparin injection 5,000 Units  5,000 Units Subcutaneous Q8H Bennie Pierini, MD   5,000 Units at 08/23/21 0557   insulin aspart (novoLOG) injection 0-15 Units  0-15 Units Subcutaneous Q4H Rust-Chester, Toribio Harbour  L, NP   5 Units at 08/23/21 0755   ipratropium-albuterol (DUONEB) 0.5-2.5 (3) MG/3ML nebulizer solution 3 mL  3 mL Nebulization Q4H Kasa, Kurian, MD       latanoprost (XALATAN) 0.005 % ophthalmic solution 1 drop  1 drop Both Eyes QHS Cox, Amy N, DO   1 drop at 08/22/21 2116   methylPREDNISolone sodium succinate (SOLU-MEDROL) 40 mg/mL injection 20 mg  20 mg Intravenous Q12H Graves, Raeford Razor, NP       ondansetron (ZOFRAN) tablet 4 mg  4 mg Per Tube Q6H PRN Bennie Pierini, MD       Or   ondansetron [facility]) injection 4 mg  4 mg Intravenous Q6H PRN Bennie Pierini, MD       pantoprazole sodium (PROTONIX) 40 mg/20 mL oral suspension 40 mg  40 mg Per Tube Daily Bennie Pierini, MD   40 mg at 08/22/21 1335   rOPINIRole (REQUIP) tablet 1 mg  1 mg Per Tube QHS Bennie Pierini, MD   1 mg at 08/22/21 2118    sodium chloride flush (NS) 0.9 % injection 10-40 mL  10-40 mL Intracatheter Q12H Maryjane Hurter, MD   30 mL at 08/23/21 0958   sodium chloride flush (NS) 0.9 % injection 10-40 mL  10-40 mL Intracatheter PRN Maryjane Hurter, MD       vancomycin (VANCOCIN) IVPB 1000 mg/200 mL premix  1,000 mg Intravenous Once Lorna Dibble, RPH 200 mL/hr at 08/23/21 1057 1,000 mg at 08/23/21 1057   [START ON 08/24/2021] vancomycin (VANCOREADY) IVPB 750 mg/150 mL  750 mg Intravenous Q36H Lorna Dibble, [facility]         Discharge Medications: Please see discharge summary for a list of discharge medications.  Relevant Imaging Results:  Relevant Lab Results:   Additional Information SS# [id number]  Adelene Amas, LCSWA

## 2021-08-23 NOTE — Progress Notes (Signed)
Patient CBG up to 174. Cecelia Byars, NP notified. See new order.

## 2021-08-23 NOTE — Procedures (Signed)
Endotracheal Intubation: Patient required placement of an artificial airway secondary to Respiratory Failure  Consent: Emergent.   Hand washing performed prior to starting the procedure.   Medications administered for sedation prior to procedure:    Rocuronium 10 mg IV, Fentanyl 50 mcg IV. Etomidate 20 mg    A time out procedure was called and correct patient, name, & ID confirmed. Needed supplies and equipment were assembled and checked to include ETT, 10 ml syringe, Glidescope, Mac and Miller blades, suction, oxygen and bag mask valve, end tidal CO2 monitor.   Patient was positioned to align the mouth and pharynx to facilitate visualization of the glottis.   Heart rate, SpO2 and blood pressure was continuously monitored during the procedure. Pre-oxygenation was conducted prior to intubation and endotracheal tube was placed through the vocal cords into the trachea.     The artificial airway was placed under direct visualization via glidescope route using a 8.0 ETT on the first attempt.  ETT was secured at 23 cm mark.  Placement was confirmed by auscuitation of lungs with good breath sounds bilaterally and no stomach sounds.  Condensation was noted on endotracheal tube.   Pulse ox 98%.  CO2 detector in place with appropriate color change.   Complications: None .   Operator: Alyss Granato.   Chest radiograph ordered and pending.    Corrin Parker, M.D.  [facility]  Medical Director [place] Director [facility] Cardio-Pulmonary Department

## 2021-08-23 NOTE — Progress Notes (Signed)
Attempted to contact pts husband Jaycie Kregel via telephone to discuss decline in pts respiratory status, however his son Garnette Scheuermann answered the telephone because Mr. Ackerley was unavailable.  I spoke with pts son Mr. Jacky Kindle and informed him pt is HIGH RISK for reintubation due to decline in respiratory status.  If deemed necessary Mr. Jacky Kindle would like pt reintubated if her respiratory status does not improve.  He stated he will visit the pt later on today.  Will continue to monitor and assess pt.   Camie Patience, AGNP  Pulmonary/Critical Care Pager [phone] (please enter 7 digits) PCCM Consult Pager [phone] (please enter 7 digits)

## 2021-08-23 NOTE — Progress Notes (Signed)
PT Cancellation Note  Patient Details Name: [name] MRN: [id number] DOB: [birth year]   Cancelled Treatment:    Reason Eval/Treat Not Completed: Medical issues which prohibited therapy. PT will hold due to ongoing respiratory issues. Discussed with nurse. PT to follow up as appropriate as medical status improves.   Donna Bernard, PT, MPT  Ina Homes 08/23/2021, 9:18 AM

## 2021-08-23 NOTE — Plan of Care (Signed)

## 2021-08-23 NOTE — TOC Progression Note (Signed)
Transition of Care [facility]) - Progression Note    Patient Details  Name: SAFIYA GIRDLER MRN: [id number] Date of Birth: 20-Nov-1945  Transition of Care [facility]) CM/SW Contact  Joseph Art, [place] Phone Number: 08/23/2021, 11:54 AM  Clinical Narrative:     PT/OT recommended SNF.  Patient respiratory needs have increased. Critical Care NP spoke with patient's son Garnette Scheuermann and updated him.  Mr. Jacky Kindle stated he would support intubation if the patient respiratory status does not improve. CSW updated Attending requesting FL2 signature.  Expected Discharge Plan: Skilled Nursing Facility Barriers to Discharge: Continued Medical Work up  Expected Discharge Plan and Services Expected Discharge Plan: Skilled Nursing Facility In-house Referral: Clinical Social Work   Post Acute Care Choice: Home Health, Durable Medical Equipment Living arrangements for the past 2 months: Single Family Home                                       Social Determinants of Health (SDOH) Interventions    Readmission Risk Interventions Readmission Risk Prevention Plan 09/16/2020  Transportation Screening Complete  PCP or Specialist Appt within 3-5 Days Complete  HRI or Home Care Consult Complete  Social Work Consult for Recovery Care Planning/Counseling Complete  Palliative Care Screening Not Applicable  Medication Review Oceanographer) Complete  Some recent data might be hidden

## 2021-08-23 NOTE — Consult Note (Signed)
Pharmacy Antibiotic Note  Suzanne Avila is a 75 y.o. female admitted on 09-08-2021 with (HCAP) pneumonia.  Pharmacy has been consulted for Vancomycin & Cefepime dosing.  Plan: Vancomycin 1g loading dose (~25 mg/kg); then Vanc 750mg  q36h (est AUC 486 based on IBW & SCr 0.8 (rounded from 0.68); Vd 0.72) Cefepime 2g IV q12h  Height: 4\' 8"  (142.2 cm) Weight: 43.5 kg (95 lb 14.4 oz) IBW/kg (Calculated) : 36.3  Temp (24hrs), Avg:98.7 F (37.1 C), Min:98.1 F (36.7 C), Max:99 F (37.2 C)  Recent Labs  Lab 08/16/21 1427 08/16/21 1450 08/17/21 0635 08/18/21 0514 08/19/21 0443 08/20/21 0544 08/21/21 0510 08/22/21 0519 08/23/21 0438  WBC  --   --  19.6*   < > 12.2* 10.3 10.3 9.8 12.6*  CREATININE  --    < > 2.12*   < > 1.02* 0.81 0.85 0.71 0.68  LATICACIDVEN 1.4  --   --   --   --   --   --   --   --   VANCORANDOM  --   --  13  --   --   --   --   --   --    < > = values in this interval not displayed.    Estimated Creatinine Clearance: 34.3 mL/min (by C-G formula based on SCr of 0.68 mg/dL).    Allergies  Allergen Reactions   Codeine Itching   Cyclobenzaprine Itching   Penicillins     Itching without rash per patient.  Unable to say when it happened, other than "a long time ago"    Antimicrobials this admission: Completed: Metronidazole & azithro (10/24); Unasyn (10/25-10/27) Ongoing: Vancomycin (10/23);      (10/31>> Cefepime     (10/23-24); (10/31 >>   Dose adjustments this admission: CTM and adjust PRN  Microbiology results: 10/23 BCx: NGTD 10/24 UCx: NGTD  10/25 Trach aspirate: Few yeast, few GPR, rare GNR  10/24 MRSA PCR: negative  Thank you for allowing pharmacy to be a part of this patient's care.  11/25 [name] 08/23/2021 9:05 AM

## 2021-08-23 NOTE — Progress Notes (Signed)
SLP Cancellation Note  Patient Details Name: Suzanne Avila MRN: [id number] DOB: [birth year]   Cancelled treatment:       Reason Eval/Treat Not Completed: Medical issues which prohibited therapy. SLP to hold at this time due to tenuous respiratory status with increasing supplemental O2 requirements. Pt now on 10L/min O2 via HFNC. Discussed with nurse. Recommend strict NPO at this time given the above.   SLP will continue to monitor for improvement in medical status for safe participation in PO trials.   RN aware and in agreement with POC.    Alessandra Bevels Digna Countess 08/23/2021, 9:25 AM

## 2021-08-23 NOTE — Progress Notes (Signed)
NAME:  Suzanne Avila, MRN:  [id number], DOB:  Jun 09, 1946, LOS: 7 ADMISSION DATE:  09-05-21, CONSULTATION DATE:  08/16/21  History of Present Illness:  75yF with PMH as noted below who presented to the ED for dyspnea on 10/23. She had recently been discharged to SNF following hospitalization 9/28-10/3 for right femoral fracture repair. Since leaving SNF she'd had about 10d of diarrhea as well as constipation.   She required 2L nasal cannula in setting of her pneumonia. She was started on vanc/cefepime and cultured, given 1.5L saline bolus, started on saline maintenance fluids and overnight started on bicarb infusion for severe metabolic acidosis. Primary team reached out today for increased work of breathing not long after starting bicarb gtt, decreased responsiveness, gurgling/concern for aspiration.  Pertinent  Medical History  GERD Smoking COPD RUL cavitary lesion, improved on most recent CT chest Recent right femoral fracture pAF  Significant Hospital Events: Including procedures, antibiotic start and stop dates in addition to other pertinent events   10/23 admitted 10/24 intubated in setting encephalopathy and acute hypoxic respiratory failure with multifocal pneumonia and likely ongoing aspiration, metabolic acidosis 10/24 US Renal Atrophic echogenic RIGHT kidney. Normal LEFT kidney. No hydronephrosis. Foley catheter in bladder 10/26 CT Chest/Abd/Pelvis revealed persistent and markedly progressive patchy multifocal pneumonia. Small bilateral pleural effusions with overlying atelectasis. Intra and extrahepatic biliary dilatation appears new or at least more obvious with contrast. No obvious common bile duct stones or pancreatic head mass. Recommend correlation with liver function studies. MRCP may be helpful for further evaluation if the patient can hold her breath. Suspect inflammatory changes involving the bowel as detailed above. No obstruction. Abnormally thickened endometrium  worrisome for endometrial carcinoma. Recommend follow-up pelvic ultrasound examination and consideration for endometrial sampling. Severe atherosclerotic calcifications involving the aorta and branch vessels including the coronary arteries. 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 Small right kidney with marked renal cortical atrophy likely due to chronic renal artery stenosis. 10/26 CT Head revealed no acute intracranial pathology. Small bilateral mastoid effusions 10/27 Failed SBT d/t increased WOB 10/29: extubated to [facility] 10/31: Dobhoff placed and verified with tube feeds started yesterday. Patient switched from [facility] to 6L and reportedly tolerated this; however, has developed resp distress with increased WOB, SOB, diffuse crackles/rhonchi and sats 89-90%. CXR ordered for am.   Interim History / Subjective:  Significant events overnight: Dobhoff placed and verified with tube feeds started yesterday. Patient switched from [facility] to 4L and reportedly tolerated this; however, overnight developed resp distress with increased WOB, SOB, diffuse crackles/rhonchi and sats 89-90%.  Patient seen resting in bed in resp distress, increased WOB with bilateral crackles/rhonchi and O2 sats 89% on 4 liters via [place]. Denies pain, however in pain with repositioning.   Objective   Blood pressure (!) 113/95, pulse 95, temperature 99 F (37.2 C), temperature source Axillary, resp. rate 19, height 4\' 8"  (1.422 m), weight 43.5 kg, SpO2 92 %.      Intake/Output Summary (Last 24 hours) at 08/23/2021 0805 Last data filed at 08/23/2021 0700 Gross per 24 hour  Intake 1252.35 ml  Output 1328 ml  Net -75.65 ml   Filed Weights   08/21/21 0452 08/22/21 0500 08/23/21 0442  Weight: 48.3 kg 44.3 kg 43.5 kg    Examination: GENERAL: critically ill appearing, cachetic +resp distress EYES: Pupils equal, round, reactive to light.  No scleral icterus.  MOUTH: Moist mucosal membrane.   NECK: Supple.  PULMONARY: bilateral diffuse crackles and rhonchi to  ascultation, increased WOB, weak cough CARDIOVASCULAR: S1 and S2.  No murmurs  GASTROINTESTINAL: Soft, nontender, non-distended. Positive bowel sounds.  MUSCULOSKELETAL: No clubbing. 3+ pitting edema bilateral extremities NEUROLOGIC: awake, alert, following some commands SKIN: intact,warm,dry  Resolved Hospital Problem list   Septic shock Mild Hyperkalemia  Paroxysmal atrial fibrillation AKI  Assessment & Plan:   Acute hypoxic respiratory failure Multifocal pneumonia Possible aspiration  COPD with exacerbation Extubated 10/29 to [facility], transitioned to 4lpm [place] 10/30; developed resp distress pm 10/30 High risk of reintubation Monitor respiratory status closely.  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 per CT Abd/Pelvis 10/26 Vascular Surgery consulted, follow up as outpatient DVT ppx with SQH  Anemia Thrombocytopenia likely secondary to sepsis  Trend H&H Monitor for s/sx of bleeding  Transfuse for Hgb<7  Hypoglycemia D10 infusion via PICC CBG per protocol  Intra and extrahepatic biliary dilatation Liver enzymes are normal and condition improved will hold off on MRCP at this time RUQ U/S today  Thickened endometrium worrisome for endometrial carcinoma Will need transvaginal as outpatient for further assessment  Cavitary right apical lung  lesion Chest CT showed significant improvement in cavitary lesion Pulmonary follow up post hospitalization  Best Practice (right click and "Reselect all SmartList Selections" daily)   Diet/type: Tube feeds DVT prophylaxis: prophylactic heparin  GI prophylaxis: PPI Lines: N/A Foley:  Yes, and it is no longer needed Code Status:  full code Last date of multidisciplinary goals of care discussion [08/23/2021]  Patient with new respiratory distress in setting of recent extubation, recent initiation of tube feeds; high risk of aspiration and reintubation. Will reach out to family to discuss goals of care and order palliative care consult. PCCM will continue to manage care.     DVT/GI PRX  assessed I Assessed the need for Labs I Assessed the need for Foley I Assessed the need for Central Venous Line Family Discussion when available I Assessed the need for Mobilization I made an Assessment of medications to be adjusted accordingly Safety Risk assessment completed  CASE DISCUSSED IN MULTIDISCIPLINARY ROUNDS WITH ICU TEAM     Critical Care Time devoted to patient care services described in this note is 45  minutes.  Critical care was necessary to treat /prevent imminent and life-threatening deterioration. Overall, patient is critically ill, prognosis is guarded.  Patient with Multiorgan failure and at high risk for cardiac arrest and death.    [place], M.D.  08/25/2021 Pulmonary & Critical Care Medicine  Medical Director [facility] Medical Director [facility] Cardio-Pulmonary Department

## 2021-08-24 ENCOUNTER — Inpatient Hospital Stay: Payer: Medicare Other

## 2021-08-24 ENCOUNTER — Ambulatory Visit: Payer: Medicare Other | Admitting: Pulmonary Disease

## 2021-08-24 DIAGNOSIS — R52 Pain, unspecified: Secondary | ICD-10-CM

## 2021-08-24 DIAGNOSIS — Z7189 Other specified counseling: Secondary | ICD-10-CM | POA: Diagnosis not present

## 2021-08-24 DIAGNOSIS — K838 Other specified diseases of biliary tract: Secondary | ICD-10-CM

## 2021-08-24 DIAGNOSIS — E440 Moderate protein-calorie malnutrition: Secondary | ICD-10-CM

## 2021-08-24 DIAGNOSIS — F17200 Nicotine dependence, unspecified, uncomplicated: Secondary | ICD-10-CM

## 2021-08-24 DIAGNOSIS — R0902 Hypoxemia: Secondary | ICD-10-CM | POA: Diagnosis not present

## 2021-08-24 DIAGNOSIS — J189 Pneumonia, unspecified organism: Secondary | ICD-10-CM | POA: Diagnosis not present

## 2021-08-24 DIAGNOSIS — E8721 Acute metabolic acidosis: Secondary | ICD-10-CM

## 2021-08-24 DIAGNOSIS — Z515 Encounter for palliative care: Secondary | ICD-10-CM

## 2021-08-24 LAB — BASIC METABOLIC PANEL
Anion gap: 8 (ref 5–15)
BUN: 34 mg/dL — ABNORMAL HIGH (ref 8–23)
CO2: 23 mmol/L (ref 22–32)
Calcium: 8.1 mg/dL — ABNORMAL LOW (ref 8.9–10.3)
Chloride: 112 mmol/L — ABNORMAL HIGH (ref 98–111)
Creatinine, Ser: 1.04 mg/dL — ABNORMAL HIGH (ref 0.44–1.00)
GFR, Estimated: 56 mL/min — ABNORMAL LOW (ref >60)
Glucose, Bld: 227 mg/dL — ABNORMAL HIGH (ref 70–99)
Potassium: 4.6 mmol/L (ref 3.5–5.1)
Sodium: 143 mmol/L (ref 135–145)

## 2021-08-24 LAB — GLUCOSE, CAPILLARY
Glucose-Capillary: 185 mg/dL — ABNORMAL HIGH (ref 70–99)
Glucose-Capillary: 206 mg/dL — ABNORMAL HIGH (ref 70–99)
Glucose-Capillary: 211 mg/dL — ABNORMAL HIGH (ref 70–99)
Glucose-Capillary: 226 mg/dL — ABNORMAL HIGH (ref 70–99)
Glucose-Capillary: 242 mg/dL — ABNORMAL HIGH (ref 70–99)
Glucose-Capillary: 276 mg/dL — ABNORMAL HIGH (ref 70–99)

## 2021-08-24 LAB — CBC
HCT: 22.9 % — ABNORMAL LOW (ref 36.0–46.0)
Hemoglobin: 7.5 g/dL — ABNORMAL LOW (ref 12.0–15.0)
MCH: 28.7 pg (ref 26.0–34.0)
MCHC: 32.8 g/dL (ref 30.0–36.0)
MCV: 87.7 fL (ref 80.0–100.0)
Platelets: 180 10*3/uL (ref 150–400)
RBC: 2.61 MIL/uL — ABNORMAL LOW (ref 3.87–5.11)
RDW: 18.8 % — ABNORMAL HIGH (ref 11.5–15.5)
WBC: 6.6 10*3/uL (ref 4.0–10.5)
nRBC: 0 % (ref 0.0–0.2)

## 2021-08-24 LAB — PHOSPHORUS: Phosphorus: 4.5 mg/dL (ref 2.5–4.6)

## 2021-08-24 LAB — MAGNESIUM: Magnesium: 2.1 mg/dL (ref 1.7–2.4)

## 2021-08-24 MED ORDER — NOREPINEPHRINE 4 MG/250ML-% IV SOLN
0.0000 ug/min | INTRAVENOUS | Status: DC
  Administered 2021-08-24: 10 ug/min via INTRAVENOUS
  Administered 2021-08-25: 6 ug/min via INTRAVENOUS
  Filled 2021-08-24: qty 250

## 2021-08-24 MED ORDER — PROSOURCE TF PO LIQD
45.0000 mL | Freq: Every day | ORAL | Status: DC
  Administered 2021-08-25 – 2021-08-26 (×2): 45 mL
  Filled 2021-08-24 (×2): qty 45

## 2021-08-24 MED ORDER — NOREPINEPHRINE 4 MG/250ML-% IV SOLN
INTRAVENOUS | Status: AC
  Administered 2021-08-24: 5 ug/min via INTRAVENOUS
  Filled 2021-08-24: qty 250

## 2021-08-24 MED ORDER — SODIUM CHLORIDE 0.9 % IV SOLN
2.0000 g | INTRAVENOUS | Status: DC
  Filled 2021-08-24: qty 2

## 2021-08-24 MED ORDER — ADULT MULTIVITAMIN LIQUID CH
15.0000 mL | Freq: Every day | ORAL | Status: DC
  Administered 2021-08-25 – 2021-08-26 (×2): 15 mL
  Filled 2021-08-24 (×2): qty 15

## 2021-08-24 MED ORDER — FREE WATER
30.0000 mL | Status: DC
  Administered 2021-08-24 – 2021-08-26 (×14): 30 mL

## 2021-08-24 MED ORDER — VITAL AF 1.2 CAL PO LIQD
1000.0000 mL | ORAL | Status: DC
  Administered 2021-08-24 – 2021-08-26 (×4): 1000 mL

## 2021-08-24 MED ORDER — INSULIN ASPART 100 UNIT/ML IJ SOLN
2.0000 [IU] | INTRAMUSCULAR | Status: DC
  Administered 2021-08-24 – 2021-08-26 (×13): 2 [IU] via SUBCUTANEOUS
  Filled 2021-08-24 (×8): qty 1

## 2021-08-24 MED ORDER — JUVEN PO PACK
1.0000 | PACK | Freq: Two times a day (BID) | ORAL | Status: DC
  Administered 2021-08-24 – 2021-08-26 (×5): 1

## 2021-08-24 MED ORDER — FENTANYL BOLUS VIA INFUSION
50.0000 ug | Freq: Once | INTRAVENOUS | Status: DC
  Filled 2021-08-24: qty 50

## 2021-08-24 MED ORDER — VANCOMYCIN HCL 750 MG/150ML IV SOLN
750.0000 mg | INTRAVENOUS | Status: DC
Start: 2021-08-26 — End: 2021-08-24

## 2021-08-24 MED ORDER — DEXMEDETOMIDINE HCL IN NACL 400 MCG/100ML IV SOLN
0.4000 ug/kg/h | INTRAVENOUS | Status: DC
Start: 2021-08-24 — End: 2021-08-27
  Administered 2021-08-24: 0.4 ug/kg/h via INTRAVENOUS
  Administered 2021-08-25: 0.5 ug/kg/h via INTRAVENOUS
  Filled 2021-08-24 (×2): qty 100

## 2021-08-24 NOTE — Progress Notes (Signed)
NAME:  Suzanne Avila, MRN:  [id number], DOB:  [birth year], LOS: 8 ADMISSION DATE:  07/30/2021, CONSULTATION DATE:  08/16/21  History of Present Illness:  75yF with PMH as noted below who presented to the ED for dyspnea on 10/23. She had recently been discharged to SNF following hospitalization 9/28-10/3 for right femoral fracture repair. Since leaving SNF she'd had about 10d of diarrhea as well as constipation.   She required 2L nasal cannula in setting of her pneumonia. She was started on vanc/cefepime and cultured, given 1.5L saline bolus, started on saline maintenance fluids and overnight started on bicarb infusion for severe metabolic acidosis. Primary team reached out today for increased work of breathing not long after starting bicarb gtt, decreased responsiveness, gurgling/concern for aspiration.  Pertinent  Medical History  GERD Smoking COPD RUL cavitary lesion Recent right femoral fracture pAF  Significant Hospital Events: Including procedures, antibiotic start and stop dates in addition to other pertinent events   10/23 admitted 10/24 intubated in setting encephalopathy and acute hypoxic respiratory failure with multifocal pneumonia and likely ongoing aspiration, metabolic acidosis 10/24 US Renal Atrophic echogenic RIGHT kidney. Normal LEFT kidney. No hydronephrosis. Foley catheter in bladder 10/26 CT Chest/Abd/Pelvis revealed persistent and markedly progressive patchy multifocal pneumonia. Small bilateral pleural effusions with overlying atelectasis. Intra and extrahepatic biliary dilatation appears new or at least more obvious with contrast. No obvious common bile duct stones or pancreatic head mass. Recommend correlation with liver function studies. MRCP may be helpful for further evaluation if the patient can hold her breath. Suspect inflammatory changes involving the bowel as detailed above. No obstruction. Abnormally thickened endometrium worrisome for endometrial  carcinoma. Recommend follow-up pelvic ultrasound examination and consideration for endometrial sampling. Severe atherosclerotic calcifications involving the aorta and branch vessels including the coronary arteries. 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 Small right kidney with marked renal cortical atrophy likely due to chronic renal artery stenosis. 10/26 CT Head revealed no acute intracranial pathology. Small bilateral mastoid effusions 10/29 Pt successfully extubated  10/31 Pt with worsening acute hypoxic respiratory failure failed Bipap requiring reintubation.  Cefepime and Vancomycin started due to multifocal pneumonia     Interim History / Subjective:  Pt remains mechanically intubated with no signs of acute respiratory distress  Objective   Blood pressure 119/69, pulse 91, temperature 99.7 F (37.6 C), resp. rate 16, height 4\' 8"  (1.422 m), weight 41.8 kg, SpO2 91 %.    Vent Mode: PRVC FiO2 (%):  [40 %-50 %] 50 % Set Rate:  [16 bmp] 16 bmp Vt Set:  [400 mL] 400 mL PEEP:  [5 cmH20-8 cmH20] 8 cmH20 Plateau Pressure:  [17 cmH20-20 cmH20] 17 cmH20   Intake/Output Summary (Last 24 hours) at 08/24/2021 0838 Last data filed at 08/24/2021 0600 Gross per 24 hour  Intake 1211.31 ml  Output 1570 ml  Net -358.69 ml   Filed Weights   08/22/21 0500 08/23/21 0442 08/24/21 0348  Weight: 44.3 kg 43.5 kg 41.8 kg    Examination: General: acutely on chronically ill appearing female, NAD resting in bed mechanically intubated  HENT: supple, no JVD  Lungs: diminished throughout, even, non labored  Cardiovascular: sinus rhythm with occasional PVC's, no R/G, 2+ radial/1+ distal pulses, 1+ generalized edema  Abdomen: hypoactive BS x4, soft, non distended  Extremities: normal bulk and tone Neuro: sedated, not following commands but withdraws from painful stimulation and opens her eyes to her name, PERRL GU: indwelling foley catheter draining dark yellow urine  Resolved Hospital Problem list   Mild Hyperkalemia  Thrombocytopenia  Septic shock  Paroxysmal atrial fibrillation   Assessment & Plan:   Acute hypoxic respiratory failure Multifocal pneumonia COPD without exacerbation Continue ventilator support & lung protective strategies Wean PEEP & FiO2 as tolerated, maintain SpO2 > 90% Head of bed elevated 30 degrees, VAP protocol in place IV and scheduled nebulized steroids  Plateau pressures less than 30 cm H20  Intermittent chest x-ray & ABG PRN Daily WUA with SBT as tolerated  Ensure adequate pulmonary hygiene  Trend WBC and monitor fever curve Trend PCT  Continue cefepime and vancomycin for now pending sputum culture results    Intermittent PVC's  Not on AC at home Continuous telemetry monitoring  Continue atorvastatin  IV cardizem for rate control   Acute kidney injury, likely due to ATN~improving  Trend BMP Replace electrolytes  Monitor UOP Avoid nephrotoxic medications   Incidental finding of 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 per CT Abd/Pelvis 10/26 Vascular surgery consulted~due to no signs of lower extremity ischemia and no appearance of open wounds/infection recommended addressing atherosclerotic occlusive disease of bilateral lower extremities in the outpatient setting   Anemia without obvious acute blood loss  Trend CBC  Monitor for s/sx of bleeding  Transfuse for hgb <7  Intra and extrahepatic biliary dilatation per CT Abd/Pelvis 10/26 Liver enzymes are normal and pt remains mechanically intubated will hold off on MRCP at this time  Thickened endometrium worrisome for endometrial carcinoma per CT Abd/Pelvis 10/26 Will need transvaginal US for further assessment   Cavitary lung lesion: Chest CT showed improvement in cavitary lesion Pulmonary follow up post hospitalization  Mechanical intubation pain/discomfort  Maintain RASS goal 0 to -1 Fentanyl gtt and prn versed to maintain  RASS goal  WUA daily  Best Practice (right click and "Reselect all SmartList Selections" daily)   Diet/type: NPO; Continue tube feeds  DVT prophylaxis: prophylactic heparin  GI prophylaxis: PPI Lines: N/A Foley:  Yes, and it is still needed Code Status:  full code Last date of multidisciplinary goals of care discussion [08/24/2021]   Critical Care Time: 35 minutes   Camie Patience, AGNP  Pulmonary/Critical Care Pager [phone] (please enter 7 digits) PCCM Consult Pager [phone] (please enter 7 digits)

## 2021-08-24 NOTE — Progress Notes (Signed)
Patient UOP decreased over last 3 hours, 28ml/hr average. Only UOP response to IV Lasix. On labs patient appears to be slightly dry. NP made aware. Will continue to monitor.

## 2021-08-24 NOTE — Plan of Care (Signed)
  Problem: Education: Goal: Knowledge of General Education information will improve Description: Including pain rating scale, medication(s)/side effects and non-pharmacologic comfort measures Outcome: Not Progressing   Problem: Health Behavior/Discharge Planning: Goal: Ability to manage health-related needs will improve Outcome: Not Progressing   Problem: Clinical Measurements: Goal: Ability to maintain clinical measurements within normal limits will improve Outcome: Not Progressing Goal: Will remain free from infection Outcome: Not Progressing Goal: Diagnostic test results will improve Outcome: Not Progressing Goal: Respiratory complications will improve Outcome: Not Progressing Goal: Cardiovascular complication will be avoided Outcome: Not Progressing   Problem: Activity: Goal: Risk for activity intolerance will decrease Outcome: Not Progressing   Problem: Nutrition: Goal: Adequate nutrition will be maintained Outcome: Not Progressing   Problem: Coping: Goal: Level of anxiety will decrease Outcome: Not Progressing   Problem: Elimination: Goal: Will not experience complications related to bowel motility Outcome: Not Progressing Goal: Will not experience complications related to urinary retention Outcome: Not Progressing   Problem: Pain Managment: Goal: General experience of comfort will improve Outcome: Not Progressing   Problem: Safety: Goal: Ability to remain free from injury will improve Outcome: Not Progressing   Problem: Skin Integrity: Goal: Risk for impaired skin integrity will decrease Outcome: Not Progressing  Patient re-intubated from reoccurring pneumonia needing total care on TF unable to make most needs known. PICC line and foley line are both lines that increase risk of infection and patient requires both lines at this time with FMS will eval lines for removal as patient improves

## 2021-08-24 NOTE — Progress Notes (Signed)
Patient currently in A-Fib with HR ranging between 135-160s. Cardizem adminstered x 2 on previous shift without success. Levophed started at shift change due to hypotension. MD aware. Will continue to monitor

## 2021-08-24 NOTE — Consult Note (Signed)
Palliative Care Consult Note                                  Date: 08/24/2021   Patient Name: Suzanne Avila  DOB: 04/30/1946  MRN: [id number]  Age / Sex: 75 y.o., female  PCP: Gwyneth Sprout, FNP Referring Physician: Flora Lipps, MD  Reason for Consultation: Establishing goals of care  HPI/Patient Profile: 75 y.o. female  with past medical history of GERD, smoking, COPD, right upper lobe cavitary lesion, recent right femoral fracture with subsequent SNF admission, paroxysmal atrial fibrillation.  She presented to the hospital with complaints of 10 days of diarrhea and constipation.  After admission she became encephalopathic and presumably aspirated causing multifocal pneumonia.  She was admitted on 07/26/2021 with acute hypoxic respiratory failure, multifocal pneumonia, COPD without exacerbation, AKI due to ATN, complete occlusion of the iliac arteries, intra and extrahepatic biliary ductal dilation, thickened endometrium worrisome for endometrial carcinoma.   After acute deterioration posthospitalization she was brought to the ICU and intubated.  She was extubated on 1029 but then due to worsening hypoxic respiratory failure had to be reintubated on 10/31.  PMT has been consulted for goals of care discussion, specifically CODE STATUS.  Past Medical History:  Diagnosis Date   Closed left arm fracture    COPD (chronic obstructive pulmonary disease) ([place])    Dehydration 09/15/2020   Glaucoma    Hyperlipidemia    Hypertension    Lung mass     Subjective:   This NP Walden Field reviewed medical records, received report from team, assessed the patient and then meet at the patient's bedside to discuss diagnosis, prognosis, GOC, EOL wishes disposition and options.  I met with the patient at the bedside, although she was not able to interact and respond.  I called her son Clair Gulling and left a message for call back.  I called her husband Shanon Brow and  spoke with him on the phone for an extensive conversation as outlined below.   Concept of Palliative Care was introduced as specialized medical care for people and their families living with serious illness.  If focuses on providing relief from the symptoms and stress of a serious illness.  The goal is to improve quality of life for both the patient and the family. Values and goals of care important to patient and family were attempted to be elicited.  Created space and opportunity for patient  and family to explore thoughts and feelings regarding current medical situation   Natural trajectory and current clinical status were discussed. Questions and concerns addressed. Patient  encouraged to call with questions or concerns.    Patient/Family Understanding of Illness: The patient's husband understands that yesterday he was called to the hospital and at that time she was intubated with breathing very hard and recommended reintubating which he agreed to.  He does admit that she has had a progressive decline, eating less and essentially not thriving in her environment.  This is been ongoing since her fall with fracture.  He does not think she would want long-term vent care and stated a couple times that she would not want resuscitation.  Life Review: The patient has been married to her husband for 43 years.  Her husband states that she likes to "aggravated you" which he stated while laughing.  We reminisced on stories about their marriage and her personality.  He describes her as a "tough  old bird" and a "mama bear".  Her husband states he is a Dealer and he is struggling with her acute illness as he does not know what he will do without her.  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  She enjoyed the pet store and noticed.  They currently have 4 Dachsund dogs whom, she adores.  Patient Values: Family,  especially her son.  She is religious and identifies as Nurse, learning disability.  She would appreciate a visit from the chaplain, per her husband.  He has asked that they also call them so that they can be there for one of the visits to pray with her as well.  Today's Discussion: We had an extensive discussion about the patient, her past, goals, and things of importance to her.  We extensively reviewed her current clinical situation, chronic comorbidities, recent fall with fracture.  I shared with him the multiple medical problems including aspiration pneumonia, respiratory failure requiring ventilator support, heart issues with low blood pressure and frequent PVCs, kidney injury, and possible cancer.  He states that she is likely aware of the cancer concerns and he thinks that it has been previously identified.  I shared with him that she is in what we call "multiorgan failure" and she is at high risk for cardiac arrest and death.  I shared my concerns that she may not survive her hospitalization.  We began to have frank discussions about her goals.  He indicates she would not want to be resuscitated.  He also does not think she would want to be on a ventilator long-term.  He understands that she is on it for now to try to get better.  His overall goal for her is to get better, if she can.  When asked what we would do if she is not getting better he agreed that we would need to have further discussions at that point.  I shared with him that there is the option for compassionate extubation and comfort care if she is not making improvement despite prolonged and ongoing ventilator support.  We agreed to meet in 2 days (Thursday, 08/26/2021) at 10 AM for family meeting to discuss her progress and further needed decisions.  I explained to him that the decision on ongoing ventilator support would likely be needed sooner rather than later.  He verbalized understanding.  In the meantime, take it a day at a time and treat the  treatable.  I offered general and emotional support through therapeutic listening and other techniques.  I answered all of his questions and addressed all of his concerns.  Review of Systems  Unable to perform ROS: Intubated   Objective:   Primary Diagnoses: Present on Admission:  Acute hypoxemic respiratory failure (HCC)  Acid reflux  AF (paroxysmal atrial fibrillation) (HCC)  AKI (acute kidney injury) ([place])  Depression  Essential hypertension  Hypercholesterolemia without hypertriglyceridemia  Femur fracture, right (HCC)  Tobacco use disorder  Chronic pain syndrome   Physical Exam Vitals and nursing note reviewed.  Constitutional:      General: She is sleeping.     Appearance: She is underweight. She is ill-appearing.     Interventions: She is intubated.  HENT:     Head: Normocephalic and atraumatic.     Nose:     Comments: Dobhoff to right nare Cardiovascular:     Rate and Rhythm: Tachycardia present.  Comments: Frequent PVCs Pulmonary:     Effort: No respiratory distress. She is intubated.  Abdominal:     General: Abdomen is flat. Bowel sounds are normal.     Palpations: Abdomen is soft.  Skin:    General: Skin is warm and dry.  Neurological:     Mental Status: She is easily aroused.     Comments: Opens eyes to voice, not following simple commands    Vital Signs:  BP (!) 99/56   Pulse 95   Temp 99.9 F (37.7 C) (Esophageal)   Resp 14   Ht _0  (1.422 m)   Wt 41.8 kg   SpO2 100%   BMI 20.66 kg/m   Palliative Assessment/Data: 10% (tube feeds)    Advanced Care Planning:   Primary Decision Maker: NEXT OF KIN  Code Status/Advance Care Planning: DNR  A discussion was had today regarding advanced directives. Concepts specific to code status, artifical feeding and hydration, continued IV antibiotics and rehospitalization was had.  The difference between a aggressive medical intervention path and a palliative comfort care path for this patient at  this time was had. Options included on the MOST form was introduced and discussed, but was not completed today.  Decisions/Changes to ACP: Change to DNR  Assessment & Plan:   Impression: Critically ill 75 year old female with multi organ failure at high risk for cardiac arrest, death.  Currently admitted for aspiration pneumonia, respiratory failure, recent fall with fracture, failure to thrive, decreased oral intake, acute kidney injury, and others as described above.  There is understandable concern that she may not survive her hospitalization.  Currently she is on ventilator support.  Not requiring pressors at this time.  However, family is not clear on the gravity of her illness.  She has been made a DNR and there are plans for a family meeting in 81 hours for further discussions/decisions.  SUMMARY OF RECOMMENDATIONS   Continue vent support for now Continue to treat the treatable Take it "a day at a time" Changed to DNR Plan for family meeting Thursday, 08/26/2021 for further decisions PMT will continue to follow  Symptom Management:  Per primary team/PCCM Palliative medicine is available to assist as needed  Prognosis:  Unable to determine  Discharge Planning:  To Be Determined   Discussed with: Medical team, nursing team, patient family    Thank you for allowing [place] to participate in the care of [facility] PMT will continue to support holistically.  Time Total: 70 min  Greater than 50%  of this time was spent counseling and coordinating care related to the above assessment and plan.  Signed by: Walden Field, NP Palliative Medicine Team  Team Phone # [phone] (Nights/Weekends)  08/24/2021, 9:22 AM

## 2021-08-24 NOTE — Progress Notes (Signed)
Nutrition Follow-up  DOCUMENTATION CODES:   Non-severe (moderate) malnutrition in context of chronic illness  INTERVENTION:   Change to Vital 1.2_0 /hr + ProSource TF 79m daily via tube   Free water flushes 373mq4 hours to maintain tube patency   Regimen provides 1192kcal/day, 83g/day protein and 95835may free water  Liquid MVI daily via tube   Juven Fruit Punch BID via tube, each serving provides 95kcal and 2.5g of protein (amino acids glutamine and arginine)  NUTRITION DIAGNOSIS:   Moderate Malnutrition related to chronic illness (COPD) as evidenced by mild fat depletion, mild muscle depletion, moderate muscle depletion.  GOAL:   Provide needs based on ASPEN/SCCM guidelines -met   MONITOR:   Vent status, Labs, Weight trends, TF tolerance, Skin  ASSESSMENT:   [age]o female with h/o chronic pain syndrome with opioid use, HTN, HLD, AF, COPD, depression, lung mass and recent hip fracture s/p repair 9/28 who is admitted with sepsis and PNA  Pt re-intubated 10/31. NGT in place; pt tolerating tube feeds well. Refeed labs stable. Per chart, pt appears weight stable. Pt +3.7L on her I & Os.   Medications reviewed and include: colace, heparin, insulin, solu-medrol, protonix, miralax, cefepime, vancomycin   Labs reviewed: K 4.6 wnl, BUN 34(H), creat 1.04(H), P 4.5 wnl, Mg 2.1 wnl Hgb 7.5(L), Hct 22.9(L) Cbgs- 206, 226 x 24hrs  Patient is currently intubated on ventilator support MV: 7.1 L/min Temp (24hrs), Avg:99.8 F (37.7 C), Min:97.8 F (36.6 C), Max:100.2 F (37.9 C)  Propofol: none   MAP- >24m6m UOP- 1320ml92met Order:   Diet Order             Diet NPO time specified  Diet effective now                  EDUCATION NEEDS:   Not appropriate for education at this time  Skin:  Skin Assessment: Skin Integrity Issues: Skin Integrity Issues:: Stage I, Incisions Stage I: medial buttocks Incisions: closed rt thigh  Last BM:  10/31- TYPE  7  Height:   Ht Readings from Last 1 Encounters:  08/16/21 _1  (1.422 m)    Weight:   Wt Readings from Last 1 Encounters:  08/24/21 41.8 kg    Ideal Body Weight:  42.4 kg  BMI:  Body mass index is 20.66 kg/m.  Estimated Nutritional Needs:   Kcal:  1078kcal/day  Protein:  75-85g/day  Fluid:  1.1-1.3L/day  CaseyKoleen DistanceRD, LDN Please refer to [facility] Hospital[facility] and/or RD on-call/weekend/after hours pager

## 2021-08-24 NOTE — Progress Notes (Signed)
PT Cancellation Note  Patient Details Name: Suzanne Avila MRN: [id number] DOB: November 01, 1945   Cancelled Treatment:    Reason Eval/Treat Not Completed: Medical issues which prohibited therapy Patient has been intubated. PT will sign off given change in medical status. Please re-consult when appropriate.   Donna Bernard, PT, MPT  Ina Homes 08/24/2021, 8:39 AM

## 2021-08-24 NOTE — Progress Notes (Signed)
Inpatient Diabetes Program Recommendations  AACE/ADA: New Consensus Statement on Inpatient Glycemic Control (2015)  Target Ranges:  Prepandial:   less than 140 mg/dL      Peak postprandial:   less than 180 mg/dL (1-2 hours)      Critically ill patients:  140 - 180 mg/dL   Lab Results  Component Value Date   GLUCAP 276 (H) 08/24/2021   HGBA1C 5.6 08/18/2021    Review of Glycemic Control Results for CHARDAI, GANGEMI (MRN [id number]) as of 08/24/2021 12:21  Ref. Range 08/23/2021 19:47 08/23/2021 23:50 08/24/2021 03:32 08/24/2021 07:35 08/24/2021 11:32  Glucose-Capillary Latest Ref Range: 70 - 99 mg/dL 482 (H) 500 (H) 370 (H) 226 (H) 276 (H)   Diabetes history:  None Outpatient Diabetes medications:  None Current orders for Inpatient glycemic control:  Novolog moderate q 4 hours Vital 40 ml/hr  Inpatient Diabetes Program Recommendations:   Blood sugars trending up with feeds.  Consider adding Novolog 2 units q 4 hours to cover CHO in tube feeds.    Thanks,  Beryl Meager, RN, BC-ADM Inpatient Diabetes Coordinator Pager [phone]  (8a-5p)

## 2021-08-24 NOTE — TOC Progression Note (Signed)
Transition of Care [facility]) - Progression Note    Patient Details  Name: Suzanne Avila MRN: [id number] Date of Birth: 1946-06-13  Transition of Care [facility]) CM/SW Contact  Joseph Art, [place] Phone Number: 08/24/2021, 10:35 AM  Clinical Narrative:     Patient was intubated on 08/23/2021, Cefepime and Vancomycin started due to multifocal pneumonia.  Main contact Navarrete,David (Spouse) [phone].    Expected Discharge Plan: Skilled Nursing Facility Barriers to Discharge: Continued Medical Work up  Expected Discharge Plan and Services Expected Discharge Plan: Skilled Nursing Facility In-house Referral: Clinical Social Work   Post Acute Care Choice: Home Health, Durable Medical Equipment Living arrangements for the past 2 months: Single Family Home                                       Social Determinants of Health (SDOH) Interventions    Readmission Risk Interventions Readmission Risk Prevention Plan 09/16/2020  Transportation Screening Complete  PCP or Specialist Appt within 3-5 Days Complete  HRI or Home Care Consult Complete  Social Work Consult for Recovery Care Planning/Counseling Complete  Palliative Care Screening Not Applicable  Medication Review Oceanographer) Complete  Some recent data might be hidden

## 2021-08-24 DEATH — deceased

## 2021-08-25 DIAGNOSIS — R140 Abdominal distension (gaseous): Secondary | ICD-10-CM

## 2021-08-25 DIAGNOSIS — Z7189 Other specified counseling: Secondary | ICD-10-CM | POA: Diagnosis not present

## 2021-08-25 DIAGNOSIS — R0902 Hypoxemia: Secondary | ICD-10-CM | POA: Diagnosis not present

## 2021-08-25 DIAGNOSIS — Z515 Encounter for palliative care: Secondary | ICD-10-CM | POA: Diagnosis not present

## 2021-08-25 DIAGNOSIS — J189 Pneumonia, unspecified organism: Secondary | ICD-10-CM | POA: Diagnosis not present

## 2021-08-25 LAB — GLUCOSE, CAPILLARY
Glucose-Capillary: 224 mg/dL — ABNORMAL HIGH (ref 70–99)
Glucose-Capillary: 181 mg/dL — ABNORMAL HIGH (ref 70–99)
Glucose-Capillary: 137 mg/dL — ABNORMAL HIGH (ref 70–99)
Glucose-Capillary: 125 mg/dL — ABNORMAL HIGH (ref 70–99)
Glucose-Capillary: 156 mg/dL — ABNORMAL HIGH (ref 70–99)

## 2021-08-25 LAB — BASIC METABOLIC PANEL
Anion gap: 4 — ABNORMAL LOW (ref 5–15)
BUN: 41 mg/dL — ABNORMAL HIGH (ref 8–23)
CO2: 25 mmol/L (ref 22–32)
Calcium: 8.3 mg/dL — ABNORMAL LOW (ref 8.9–10.3)
Chloride: 113 mmol/L — ABNORMAL HIGH (ref 98–111)
Creatinine, Ser: 0.95 mg/dL (ref 0.44–1.00)
GFR, Estimated: >60 mL/min (ref >60)
Glucose, Bld: 271 mg/dL — ABNORMAL HIGH (ref 70–99)
Potassium: 5.1 mmol/L (ref 3.5–5.1)
Sodium: 142 mmol/L (ref 135–145)

## 2021-08-25 LAB — CBC
HCT: 24.6 % — ABNORMAL LOW (ref 36.0–46.0)
Hemoglobin: 7.4 g/dL — ABNORMAL LOW (ref 12.0–15.0)
MCH: 27.1 pg (ref 26.0–34.0)
MCHC: 30.1 g/dL (ref 30.0–36.0)
MCV: 90.1 fL (ref 80.0–100.0)
Platelets: 295 10*3/uL (ref 150–400)
RBC: 2.73 MIL/uL — ABNORMAL LOW (ref 3.87–5.11)
RDW: 19.3 % — ABNORMAL HIGH (ref 11.5–15.5)
WBC: 9.6 10*3/uL (ref 4.0–10.5)
nRBC: 0 % (ref 0.0–0.2)

## 2021-08-25 LAB — PHOSPHORUS: Phosphorus: 3 mg/dL (ref 2.5–4.6)

## 2021-08-25 LAB — MAGNESIUM: Magnesium: 1.9 mg/dL (ref 1.7–2.4)

## 2021-08-25 MED ORDER — METOPROLOL TARTRATE 5 MG/5ML IV SOLN
INTRAVENOUS | Status: AC
  Administered 2021-08-25: 5 mg via INTRAVENOUS
  Filled 2021-08-25: qty 5

## 2021-08-25 MED ORDER — CHLORHEXIDINE GLUCONATE 0.12% ORAL RINSE (MEDLINE KIT): 15.0000 mL | Freq: Two times a day (BID) | OROMUCOSAL | Status: DC

## 2021-08-25 MED ORDER — DILTIAZEM HCL 25 MG/5ML IV SOLN: 10.0000 mg | Freq: Four times a day (QID) | INTRAVENOUS | Status: DC | PRN

## 2021-08-25 MED ORDER — PHENYLEPHRINE CONCENTRATED 100MG/250ML (0.4 MG/ML) INFUSION SIMPLE
0.0000 ug/min | INTRAVENOUS | Status: DC
Start: 2021-08-25 — End: 2021-08-26
  Filled 2021-08-25: qty 250

## 2021-08-25 MED ORDER — AMIODARONE IV BOLUS ONLY 150 MG/100ML
150.0000 mg | Freq: Once | INTRAVENOUS | Status: AC
  Administered 2021-08-25: 150 mg via INTRAVENOUS
  Filled 2021-08-25: qty 100

## 2021-08-25 MED ORDER — AMIODARONE LOAD VIA INFUSION
150.0000 mg | Freq: Once | INTRAVENOUS | Status: DC
  Filled 2021-08-25: qty 83.34

## 2021-08-25 MED ORDER — AMIODARONE HCL IN DEXTROSE 360-4.14 MG/200ML-% IV SOLN: 30.0000 mg/h | INTRAVENOUS | Status: DC

## 2021-08-25 MED ORDER — ORAL CARE MOUTH RINSE
15.0000 mL | OROMUCOSAL | Status: DC
  Administered 2021-08-25 – 2021-08-26 (×11): 15 mL via OROMUCOSAL

## 2021-08-25 MED ORDER — SODIUM CHLORIDE 0.9 % IV SOLN
2.0000 g | Freq: Two times a day (BID) | INTRAVENOUS | Status: DC
  Administered 2021-08-25 – 2021-08-26 (×3): 2 g via INTRAVENOUS
  Filled 2021-08-25 (×5): qty 2

## 2021-08-25 MED ORDER — METOPROLOL TARTRATE 5 MG/5ML IV SOLN: 5.0000 mg | Freq: Once | INTRAVENOUS | Status: AC

## 2021-08-25 MED ORDER — PROPOFOL 1000 MG/100ML IV EMUL
5.0000 ug/kg/min | INTRAVENOUS | Status: DC
  Administered 2021-08-25: 5 ug/kg/min via INTRAVENOUS
  Administered 2021-08-26 (×2): 50 ug/kg/min via INTRAVENOUS
  Administered 2021-08-26: 40 ug/kg/min via INTRAVENOUS
  Filled 2021-08-25 (×4): qty 100

## 2021-08-25 MED ORDER — AMIODARONE HCL IN DEXTROSE 360-4.14 MG/200ML-% IV SOLN: 60.0000 mg/h | INTRAVENOUS | Status: DC

## 2021-08-25 NOTE — Progress Notes (Signed)
Daily Progress Note   Patient Name: Suzanne Avila       Date: 08/25/2021 DOB: [birth year]  Age: 75 y.o. MRN#: [id number] Attending Physician: Flora Lipps, MD Primary Care Physician: Gwyneth Sprout, FNP Admit Date: 07/27/2021 Length of Stay: 9 days  Reason for Consultation/Follow-up: Establishing goals of care  HPI/Patient Profile:  75 y.o. female  with past medical history of GERD, smoking, COPD, right upper lobe cavitary lesion, recent right femoral fracture with subsequent SNF admission, paroxysmal atrial fibrillation.  She presented to the hospital with complaints of 10 days of diarrhea and constipation.  After admission she became encephalopathic and presumably aspirated causing multifocal pneumonia.  She was admitted on 08/03/2021 with acute hypoxic respiratory failure, multifocal pneumonia, COPD without exacerbation, AKI due to ATN, complete occlusion of the iliac arteries, intra and extrahepatic biliary ductal dilation, thickened endometrium worrisome for endometrial carcinoma.    After acute deterioration posthospitalization she was brought to the ICU and intubated.  She was extubated on 1029 but then due to worsening hypoxic respiratory failure had to be reintubated on 10/31.   PMT has been consulted for goals of care discussion, specifically CODE STATUS.  Subjective:   Subjective: Chart Reviewed. Updates received. Patient Assessed. Created space and opportunity for patient  and family to explore thoughts and feelings regarding current medical situation.  Today's Discussion: I met with the patient at the bedside, although she is intubated, critically ill, unable to participate.  Review of Systems  Unable to perform ROS: Intubated   Objective:   Vital Signs:  BP 107/88   Pulse (!) 126   Temp 99.9 F (37.7 C)   Resp 10   Ht _0  (1.422 m)   Wt 44.6 kg   SpO2 99%   BMI 22.04 kg/m   Physical Exam: Physical Exam Vitals and nursing note reviewed.  Constitutional:       General: She is not in acute distress.    Appearance: She is normal weight. She is ill-appearing and toxic-appearing.     Interventions: She is intubated.  HENT:     Head: Normocephalic and atraumatic.  Cardiovascular:     Rate and Rhythm: Tachycardia present.  Pulmonary:     Effort: She is intubated.  Skin:    General: Skin is cool and dry.  Neurological:     Comments: Sedated, unresponsive    Palliative Assessment/Data: 10%   Assessment & Plan:   Impression: Present on Admission:  Acute hypoxemic respiratory failure (HCC)  Acid reflux  AF (paroxysmal atrial fibrillation) (HCC)  AKI (acute kidney injury) ([place])  Depression  Essential hypertension  Hypercholesterolemia without hypertriglyceridemia  Femur fracture, right (HCC)  Tobacco use disorder  Chronic pain syndrome  Critically ill 75 year old female with multi organ failure at high risk for cardiac arrest, death.  Currently admitted for aspiration pneumonia, respiratory failure, recent fall with fracture, failure to thrive, decreased oral intake, acute kidney injury, and others as described above.  There is understandable concern that she may not survive her hospitalization.  Currently she is on ventilator support.  Not requiring pressors at this time.  However, family is not clear on the gravity of her illness.  She has been made a DNR and there are plans for a family meeting in 54 hours for further discussions/decisions. Despite allowing 24 to 48 hours for outcomes, it does not appear that the patient is improving.  In fact, it appears that she is having some decline.  Her heart rate is quite high  despite medical treatment.  Prognosis appears quite grim.  SUMMARY OF RECOMMENDATIONS   Continue vent support for now Continue to treat the treatable Take it "a day at a time" Remain DNR Plan for family meeting Thursday, 08/26/2021 10:00 am for further decisions PMT will continue to follow  Code Status: DNR  Prognosis:  Unable to determine  Discharge Planning: To Be Determined  Discussed with: Medical team, nursing tesm  Thank you for allowing [place] to participate in the care of [name] PMT will continue to support holistically.  Time Total: 25 min  Visit consisted of counseling and education dealing with the complex and emotionally intense issues of symptom management and palliative care in the setting of serious and potentially life-threatening illness. Greater than 50%  of this time was spent counseling and coordinating care related to the above assessment and plan.  Walden Field, NP Palliative Medicine Team  Team Phone # [phone] (Nights/Weekends)  06/22/2021, 8:17 AM

## 2021-08-25 NOTE — Progress Notes (Addendum)
NAME:  Suzanne Avila, MRN:  [id number], DOB:  04-13-1946, LOS: 9 ADMISSION DATE:  08/01/2021, CONSULTATION DATE:  08/16/21  History of Present Illness:  75yF with PMH as noted below who presented to the ED for dyspnea on 10/23. She had recently been discharged to SNF following hospitalization 9/28-10/3 for right femoral fracture repair. Since leaving SNF she'd had about 10d of diarrhea as well as constipation.   She required 2L nasal cannula in setting of her pneumonia. She was started on vanc/cefepime and cultured, given 1.5L saline bolus, started on saline maintenance fluids and overnight started on bicarb infusion for severe metabolic acidosis. Primary team reached out today for increased work of breathing not long after starting bicarb gtt, decreased responsiveness, gurgling/concern for aspiration.  Pertinent  Medical History  GERD Smoking COPD RUL cavitary lesion Recent right femoral fracture pAF  Significant Hospital Events: Including procedures, antibiotic start and stop dates in addition to other pertinent events   10/23 admitted 10/24 intubated in setting encephalopathy and acute hypoxic respiratory failure with multifocal pneumonia and likely ongoing aspiration, metabolic acidosis 10/24 US Renal Atrophic echogenic RIGHT kidney. Normal LEFT kidney. No hydronephrosis. Foley catheter in bladder 10/26 CT Chest/Abd/Pelvis revealed persistent and markedly progressive patchy multifocal pneumonia. Small bilateral pleural effusions with overlying atelectasis. Intra and extrahepatic biliary dilatation appears new or at least more obvious with contrast. No obvious common bile duct stones or pancreatic head mass. Recommend correlation with liver function studies. MRCP may be helpful for further evaluation if the patient can hold her breath. Suspect inflammatory changes involving the bowel as detailed above. No obstruction. Abnormally thickened endometrium worrisome for endometrial  carcinoma. Recommend follow-up pelvic ultrasound examination and consideration for endometrial sampling. Severe atherosclerotic calcifications involving the aorta and branch vessels including the coronary arteries. 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 Small right kidney with marked renal cortical atrophy likely due to chronic renal artery stenosis. 10/26 CT Head revealed no acute intracranial pathology. Small bilateral mastoid effusions 10/29 Pt successfully extubated  10/31 Pt with worsening acute hypoxic respiratory failure failed Bipap requiring reintubation.  Cefepime and Vancomycin started due to multifocal pneumonia  11/2: Remains intubated, on precedex, fentanyl, and levophed infusions rapid afib rate 150s. Awake, eyes open, not following commands.     Interim History / Subjective:  Pt remains mechanically intubated on precedex, fentanyl, levophed infusions.  Eyes open on sedation, not following commands. Responds to name.   Objective   Blood pressure 127/88, pulse (!) 153, temperature 99.7 F (37.6 C), temperature source Oral, resp. rate 13, height 4\' 8"  (1.422 m), weight 44.6 kg, SpO2 100 %.    Vent Mode: PRVC FiO2 (%):  [30 %-40 %] 30 % Set Rate:  [16 bmp] 16 bmp Vt Set:  [400 mL] 400 mL PEEP:  [5 cmH20] 5 cmH20 Plateau Pressure:  [11 cmH20-21 cmH20] 11 cmH20   Intake/Output Summary (Last 24 hours) at 08/25/2021 0819 Last data filed at 08/25/2021 13/11/2020 Gross per 24 hour  Intake 1268.71 ml  Output 1120 ml  Net 148.71 ml   Filed Weights   08/23/21 0442 08/24/21 0348 08/25/21 0500  Weight: 43.5 kg 41.8 kg 44.6 kg    Examination: General: acutely on chronically ill appearing female, NAD resting in bed mechanically intubated  HENT: supple, no JVD  Lungs: diminished throughout, even, non labored +expiratory wheezes bilaterally Cardiovascular: irregular rhythm rate 150s with occasional PVC's, no R/G, 2+ radial/1+ distal pulses Abdomen:  hypoactive BS x4, soft,  non distended  Extremities: normal bulk and tone 1+ generalized edema 2+ edema to bilateral feet Neuro: sedated, not following commands but withdraws from painful stimulation and opens her eyes to her name, PERRL GU: indwelling foley catheter draining dark yellow urine   Resolved Hospital Problem list   Mild Hyperkalemia  Thrombocytopenia  Septic shock  Paroxysmal atrial fibrillation   Assessment & Plan:   Acute hypoxic respiratory failure Multifocal pneumonia COPD without exacerbation Continue ventilator support & lung protective strategies Wean PEEP & FiO2 as tolerated, maintain SpO2 > 90% Head of bed elevated 30 degrees, VAP protocol in place IV and scheduled nebulized steroids  Plateau pressures less than 30 cm H20  Intermittent chest x-ray & ABG PRN Daily WUA with SBT as tolerated  Ensure adequate pulmonary hygiene  Trend WBC and monitor fever curve Trend PCT  Continue cefepime and vancomycin for now pending sputum culture results    Intermittent PVC's  Tachycardia Hypotension in setting of sedation for MV Not on AC at home Continuous telemetry monitoring  Continue atorvastatin  IV cardizem for rate control  Amio bolus x1 Switch levophed to phenylephrine Maintain MAP > 60 Pressors as needed and wean pressors as tol to DC  Acute kidney injury, likely due to ATN~improving  Trend BMP Replace electrolytes  Monitor UOP Avoid nephrotoxic medications   Mechanical intubation pain/discomfort  Maintain RASS goal 0 to -1 To maintain goal RASS: DC precedex, add propofol. Continue fentanyl gtt.  WUA daily  Incidental finding of 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 per CT Abd/Pelvis 10/26 Vascular surgery consulted~due to no signs of lower extremity ischemia and no appearance of open wounds/infection recommended addressing atherosclerotic occlusive disease of bilateral lower extremities in the outpatient setting   Anemia  without obvious acute blood loss  Trend CBC  Monitor for s/sx of bleeding  Transfuse for hgb <7  Intra and extrahepatic biliary dilatation per CT Abd/Pelvis 10/26 Liver enzymes are normal and pt remains mechanically intubated will hold off on MRCP at this time  Thickened endometrium worrisome for endometrial carcinoma per CT Abd/Pelvis 10/26 Will need transvaginal US for further assessment   Cavitary lung lesion: Chest CT showed improvement in cavitary lesion Pulmonary follow up post hospitalization  Best Practice (right click and "Reselect all SmartList Selections" daily)   Diet/type: NPO; Continue tube feeds  DVT prophylaxis: prophylactic heparin  GI prophylaxis: PPI Lines: N/A Foley:  Yes, and it is still needed Code Status:  full code Last date of multidisciplinary goals of care discussion [08/25/2021]  Patient is now DNR. Appreciate palliative care input with goals of care, family discussion. Tentative family meeting planned for further goals of care discussion on Thursday, 08/26/21 at 10am, per palliative care. Family has stated that patient would not want prolonged intubation if no indication of clinical improvement.        DVT/GI PRX  assessed I Assessed the need for Labs I Assessed the need for Foley I Assessed the need for Central Venous Line Family Discussion when available I Assessed the need for Mobilization I made an Assessment of medications to be adjusted accordingly Safety Risk assessment completed  CASE DISCUSSED IN MULTIDISCIPLINARY ROUNDS WITH ICU TEAM     Critical Care Time devoted to patient care services described in this note is 55 minutes.  Critical care was necessary to treat /prevent imminent and life-threatening deterioration. Overall, patient is critically ill, prognosis is guarded.  Patient with Multiorgan failure and at high risk for cardiac arrest and death.  Lucie Leather, M.D.  [facility]   Medical Director [facility] Medical Director [facility] Cardio-Pulmonary Department

## 2021-08-25 NOTE — Progress Notes (Signed)
Cardizem 10 mg administered to patient without success. Lanora Manis NP notified. Verbal order given to administer Metoprolol 5 mg via IV x 1.

## 2021-08-26 DIAGNOSIS — J189 Pneumonia, unspecified organism: Secondary | ICD-10-CM | POA: Diagnosis not present

## 2021-08-26 DIAGNOSIS — Z515 Encounter for palliative care: Secondary | ICD-10-CM | POA: Diagnosis not present

## 2021-08-26 DIAGNOSIS — Z7189 Other specified counseling: Secondary | ICD-10-CM | POA: Diagnosis not present

## 2021-08-26 DIAGNOSIS — R0902 Hypoxemia: Secondary | ICD-10-CM | POA: Diagnosis not present

## 2021-08-26 LAB — GLUCOSE, CAPILLARY
Glucose-Capillary: 146 mg/dL — ABNORMAL HIGH (ref 70–99)
Glucose-Capillary: 152 mg/dL — ABNORMAL HIGH (ref 70–99)
Glucose-Capillary: 161 mg/dL — ABNORMAL HIGH (ref 70–99)
Glucose-Capillary: 165 mg/dL — ABNORMAL HIGH (ref 70–99)
Glucose-Capillary: 165 mg/dL — ABNORMAL HIGH (ref 70–99)

## 2021-08-26 LAB — MAGNESIUM: Magnesium: 1.9 mg/dL (ref 1.7–2.4)

## 2021-08-26 LAB — TRIGLYCERIDES: Triglycerides: 118 mg/dL (ref <150)

## 2021-08-26 LAB — PHOSPHORUS: Phosphorus: 3 mg/dL (ref 2.5–4.6)

## 2021-08-26 MED ORDER — GLYCOPYRROLATE 0.2 MG/ML IJ SOLN
0.2000 mg | Freq: Once | INTRAMUSCULAR | Status: AC
  Administered 2021-08-26: 0.2 mg via INTRAVENOUS

## 2021-08-26 MED ORDER — DIPHENHYDRAMINE HCL 50 MG/ML IJ SOLN: 25.0000 mg | INTRAMUSCULAR | Status: DC | PRN

## 2021-08-26 MED ORDER — POLYVINYL ALCOHOL 1.4 % OP SOLN
1.0000 [drp] | Freq: Four times a day (QID) | OPHTHALMIC | Status: DC | PRN
  Filled 2021-08-26: qty 15

## 2021-08-26 MED ORDER — DEXTROSE 5 % IV SOLN: INTRAVENOUS | Status: DC

## 2021-08-26 MED ORDER — MORPHINE SULFATE (PF) 2 MG/ML IV SOLN
2.0000 mg | INTRAVENOUS | Status: DC | PRN
Start: 2021-08-26 — End: 2021-08-27
  Administered 2021-08-26: 2 mg via INTRAVENOUS
  Filled 2021-08-26: qty 1

## 2021-08-26 MED ORDER — MORPHINE BOLUS VIA INFUSION
5.0000 mg | INTRAVENOUS | Status: DC | PRN
  Administered 2021-08-26 (×4): 5 mg via INTRAVENOUS
  Filled 2021-08-26: qty 5

## 2021-08-26 MED ORDER — GLYCOPYRROLATE 0.2 MG/ML IJ SOLN: 0.2000 mg | INTRAMUSCULAR | Status: DC | PRN

## 2021-08-26 MED ORDER — GLYCOPYRROLATE 1 MG PO TABS
1.0000 mg | ORAL_TABLET | ORAL | Status: DC | PRN
  Filled 2021-08-26: qty 1

## 2021-08-26 MED ORDER — ACETAMINOPHEN 650 MG RE SUPP: 650.0000 mg | Freq: Four times a day (QID) | RECTAL | Status: DC | PRN

## 2021-08-26 MED ORDER — GLYCOPYRROLATE 0.2 MG/ML IJ SOLN
0.2000 mg | INTRAMUSCULAR | Status: DC | PRN
  Administered 2021-08-26: 0.2 mg via INTRAVENOUS
  Filled 2021-08-26 (×2): qty 1

## 2021-08-26 MED ORDER — MORPHINE 100MG IN NS 100ML (1MG/ML) PREMIX INFUSION
0.0000 mg/h | INTRAVENOUS | Status: DC
  Administered 2021-08-26: 20 mg/h via INTRAVENOUS
  Administered 2021-08-26: 10 mg/h via INTRAVENOUS
  Filled 2021-08-26 (×2): qty 100

## 2021-08-26 MED ORDER — MORPHINE SULFATE (PF) 2 MG/ML IV SOLN
2.0000 mg | INTRAVENOUS | Status: DC | PRN
  Administered 2021-08-26 (×2): 2 mg via INTRAVENOUS

## 2021-08-26 MED ORDER — ACETAMINOPHEN 325 MG PO TABS: 650.0000 mg | ORAL_TABLET | Freq: Four times a day (QID) | ORAL | Status: DC | PRN

## 2021-08-26 MED ORDER — MIDAZOLAM HCL 2 MG/2ML IJ SOLN
2.0000 mg | INTRAMUSCULAR | Status: DC | PRN
  Administered 2021-08-26: 2 mg via INTRAVENOUS
  Administered 2021-08-26: 4 mg via INTRAVENOUS
  Filled 2021-08-26: qty 4
  Filled 2021-08-26: qty 2

## 2021-08-26 NOTE — Plan of Care (Signed)
Patient is extubated to room air 

## 2021-08-26 NOTE — Progress Notes (Signed)
   08/26/21 2030  Clinical Encounter Type  Visited With Patient and family together  Visit Type Initial;Spiritual support  Referral From Nurse  Consult/Referral To Chaplain  Spiritual Encounters  Spiritual Needs Prayer;Emotional  Chaplain Oleta Mouse responded to a page in ICU-11, Pt Faun Mcqueen. Pt was placed on comfort care and is unresponsive. Pt's family requested prayer. Pt's granddaughter was by her bedside holding her hand. I provided spiritual and emotional support for pt and granddaughter.

## 2021-08-26 NOTE — Progress Notes (Signed)
Pt's husband, Fayrene Fearing called and told this RN that he has decided to make pt comfortable at this time and would like the pt to be taken off the ventilator. Fayrene Fearing states that he does not want to be present at any time, but would like to be frequently updated. MD notified.

## 2021-08-26 NOTE — Progress Notes (Signed)
NAME:  Suzanne Avila, MRN:  [id number], DOB:  02/04/1946, LOS: 10 ADMISSION DATE:  August 21, 2021, CONSULTATION DATE:  08/16/21  History of Present Illness:  75yF with PMH as noted below who presented to the ED for dyspnea on 10/23. She had recently been discharged to SNF following hospitalization 9/28-10/3 for right femoral fracture repair. Since leaving SNF she'd had about 10d of diarrhea as well as constipation.   She required 2L nasal cannula in setting of her pneumonia. She was started on vanc/cefepime and cultured, given 1.5L saline bolus, started on saline maintenance fluids and overnight started on bicarb infusion for severe metabolic acidosis. Primary team reached out today for increased work of breathing not long after starting bicarb gtt, decreased responsiveness, gurgling/concern for aspiration.  Pertinent  Medical History  GERD Smoking COPD RUL cavitary lesion Recent right femoral fracture pAF  Significant Hospital Events: Including procedures, antibiotic start and stop dates in addition to other pertinent events   10/23 admitted 10/24 intubated in setting encephalopathy and acute hypoxic respiratory failure with multifocal pneumonia and likely ongoing aspiration, metabolic acidosis 10/24 US Renal Atrophic echogenic RIGHT kidney. Normal LEFT kidney. No hydronephrosis. Foley catheter in bladder 10/26 CT Chest/Abd/Pelvis revealed persistent and markedly progressive patchy multifocal pneumonia. Small bilateral pleural effusions with overlying atelectasis. Intra and extrahepatic biliary dilatation appears new or at least more obvious with contrast. No obvious common bile duct stones or pancreatic head mass. Recommend correlation with liver function studies. MRCP may be helpful for further evaluation if the patient can hold her breath. Suspect inflammatory changes involving the bowel as detailed above. No obstruction. Abnormally thickened endometrium worrisome for endometrial  carcinoma. Recommend follow-up pelvic ultrasound examination and consideration for endometrial sampling. Severe atherosclerotic calcifications involving the aorta and branch vessels including the coronary arteries. 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 Small right kidney with marked renal cortical atrophy likely due to chronic renal artery stenosis. 10/26 CT Head revealed no acute intracranial pathology. Small bilateral mastoid effusions 10/29 Pt successfully extubated  10/31 Pt with worsening acute hypoxic respiratory failure failed Bipap requiring reintubation.  Cefepime and Vancomycin started due to multifocal pneumonia  11/2: Remains intubated, on precedex, fentanyl, and levophed infusions rapid afib rate 150s. Awake, eyes open, not following commands.  11/3: Minimal changes compared to yesterday, no acute events overnight. Family meeting planned with palliative care today.     Interim History / Subjective:  Pt remains mechanically intubated on precedex, fentanyl, levophed infusions.  Eyes open on sedation, not following commands. Responds to name.   Objective   Blood pressure 115/66, pulse (!) 110, temperature 99.9 F (37.7 C), temperature source Esophageal, resp. rate 10, height 4\' 8"  (1.422 m), weight 44.3 kg, SpO2 99 %.    Vent Mode: PRVC FiO2 (%):  [30 %] 30 % Set Rate:  [16 bmp] 16 bmp Vt Set:  [400 mL] 400 mL PEEP:  [5 cmH20] 5 cmH20 Plateau Pressure:  [14 cmH20] 14 cmH20   Intake/Output Summary (Last 24 hours) at 08/26/2021 0829 Last data filed at 08/26/2021 0600 Gross per 24 hour  Intake 3286.13 ml  Output 1075 ml  Net 2211.13 ml   Filed Weights   08/24/21 0348 08/25/21 0500 08/26/21 0458  Weight: 41.8 kg 44.6 kg 44.3 kg    Examination: General: acutely on chronically ill appearing female, NAD resting in bed mechanically intubated  HENT: supple, no JVD  Lungs: diminished throughout, even, non labored  Cardiovascular: irregular  rhythm rate 150s  with occasional PVC's, no R/G, 2+ radial/1+ distal pulses Abdomen: hypoactive BS x4, soft, non distended  Extremities: normal bulk and tone 1+ generalized edema 1+ pitting edema to bilateral feet Neuro: sedated, not following commands but withdraws from painful stimulation and opens her eyes to her name, PERRL GU: indwelling foley catheter draining dark yellow urine   Resolved Hospital Problem list   Mild Hyperkalemia  Thrombocytopenia  Septic shock  Paroxysmal atrial fibrillation   Assessment & Plan:   Acute hypoxic respiratory failure Multifocal pneumonia COPD without exacerbation Continue ventilator support & lung protective strategies Wean PEEP & FiO2 as tolerated, maintain SpO2 > 90% Head of bed elevated 30 degrees, VAP protocol in place IV and scheduled nebulized steroids  Plateau pressures less than 30 cm H20  Intermittent chest x-ray & ABG PRN Daily WUA with SBT as tolerated  Ensure adequate pulmonary hygiene  Trend WBC and monitor fever curve Trend PCT  Continue cefepime and vancomycin for now pending sputum culture results    Intermittent PVC's  Tachycardia Hypotension in setting of sedation for MV Not on AC at home Continuous telemetry monitoring  Continue atorvastatin  IV cardizem for rate control  Maintain MAP > 60 Pressors as needed and wean pressors as tol to DC  Acute kidney injury, likely due to ATN~improving  Trend BMP Replace electrolytes  Monitor UOP Avoid nephrotoxic medications   Mechanical intubation pain/discomfort  Maintain RASS goal 0 to -1 To maintain goal RASS: propofol, fentanyl gtt.  WUA daily  Incidental finding of 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 per CT Abd/Pelvis 10/26 Vascular surgery consulted~due to no signs of lower extremity ischemia and no appearance of open wounds/infection recommended addressing atherosclerotic occlusive disease of bilateral lower extremities in the outpatient  setting   Anemia without obvious acute blood loss  Trend CBC  Monitor for s/sx of bleeding  Transfuse for hgb <7  Intra and extrahepatic biliary dilatation per CT Abd/Pelvis 10/26 Liver enzymes are normal and pt remains mechanically intubated will hold off on MRCP at this time  Thickened endometrium worrisome for endometrial carcinoma per CT Abd/Pelvis 10/26 Will need transvaginal US for further assessment   Cavitary lung lesion: Chest CT showed improvement in cavitary lesion Pulmonary follow up post hospitalization  Best Practice (right click and "Reselect all SmartList Selections" daily)   Diet/type: NPO; Continue tube feeds  DVT prophylaxis: prophylactic heparin  GI prophylaxis: PPI Lines: N/A Foley:  Yes, and it is still needed Code Status:  full code Last date of multidisciplinary goals of care discussion [08/26/2021]  Patient is now DNR. Appreciate palliative care input with goals of care, family discussion. Tentative family meeting planned for further goals of care discussion on Thursday, 08/26/21 at 10am, per palliative care. Family has stated that patient would not want prolonged intubation if no indication of clinical improvement.     DVT/GI PRX  assessed I Assessed the need for Labs I Assessed the need for Foley I Assessed the need for Central Venous Line Family Discussion when available I Assessed the need for Mobilization I made an Assessment of medications to be adjusted accordingly Safety Risk assessment completed  CASE DISCUSSED IN MULTIDISCIPLINARY ROUNDS WITH ICU TEAM     Critical Care Time devoted to patient care services described in this note is 55 minutes.  Critical care was necessary to treat /prevent imminent and life-threatening deterioration. Overall, patient is critically ill, prognosis is guarded.  Patient with Multiorgan failure and at high risk for cardiac arrest and  death.    Lucie Leather, M.D.  [facility]  Medical Director [facility] Medical Director [facility] Cardio-Pulmonary Department

## 2021-08-26 NOTE — Progress Notes (Signed)
Daily Progress Note   Patient Name: Suzanne Avila       Date: 08/26/2021 DOB: [birth year]  Age: 75 y.o. MRN#: [id number] Attending Physician: Flora Lipps, MD Primary Care Physician: Gwyneth Sprout, FNP Admit Date: 08/09/2021 Length of Stay: 10 days  Reason for Consultation/Follow-up: Establishing goals of care  HPI/Patient Profile:  75 y.o. female  with past medical history of GERD, smoking, COPD, right upper lobe cavitary lesion, recent right femoral fracture with subsequent SNF admission, paroxysmal atrial fibrillation.  She presented to the hospital with complaints of 10 days of diarrhea and constipation.  After admission she became encephalopathic and presumably aspirated causing multifocal pneumonia.  She was admitted on 08/14/2021 with acute hypoxic respiratory failure, multifocal pneumonia, COPD without exacerbation, AKI due to ATN, complete occlusion of the iliac arteries, intra and extrahepatic biliary ductal dilation, thickened endometrium worrisome for endometrial carcinoma.    After acute deterioration posthospitalization she was brought to the ICU and intubated.  She was extubated on 1029 but then due to worsening hypoxic respiratory failure had to be reintubated on 10/31.   PMT has been consulted for goals of care discussion, specifically CODE STATUS.  Subjective:   Subjective: Chart Reviewed. Updates received. Patient Assessed. Created space and opportunity for patient  and family to explore thoughts and feelings regarding current medical situation.  Today's Discussion: I met with the patient at the bedside, although she is intubated, critically ill, unable to participate.  I participated in a family meeting with the patient's husband, patient's son, Dr. Mortimer Fries (PCCM), and two PA students. We had a substantial discussion on the patient's current clinical status, prognosis. We discussed that she will likely not be able to come off the ventilator. If continued aggressive care  desired, would need interventions such as trach and PEG, necessitating long-term to indefinite facility stay. Unlikely to return to baseline. Discussed comfort of suffering and possibility of comfort care, should they decide this. Thoroughly explained what comfort care transition would look like. Provided "Gone From My Sight: The Dying Process" book.  Her husband and son both share that she would not want prolonged artificial support. They anticipate they will elect comfort care, but would like to discuss amongst themselves tonight. Provided emotional and general support via therapeutic listening. I answered all questions, addressed all concerns.  Review of Systems  Unable to perform ROS: Intubated   Objective:   Vital Signs:  BP 115/66   Pulse (!) 110   Temp 99.9 F (37.7 C) (Esophageal)   Resp 10   Ht $R'4\' 8"'xc$  (1.422 m)   Wt 44.3 kg   SpO2 99%   BMI 21.90 kg/m   Physical Exam: Physical Exam Vitals and nursing note reviewed.  Constitutional:      General: She is not in acute distress.    Appearance: She is normal weight. She is ill-appearing and toxic-appearing.     Interventions: She is intubated.  HENT:     Head: Normocephalic and atraumatic.  Cardiovascular:     Rate and Rhythm: Tachycardia present.  Pulmonary:     Effort: She is intubated.  Skin:    General: Skin is cool and dry.  Neurological:     Comments: Sedated, unresponsive    Palliative Assessment/Data: 10%   Assessment & Plan:   Impression: Present on Admission:  Acute hypoxemic respiratory failure (HCC)  Acid reflux  AF (paroxysmal atrial fibrillation) (HCC)  AKI (acute kidney injury) ([place])  Depression  Essential hypertension  Hypercholesterolemia without hypertriglyceridemia  Femur  fracture, right ([place])  Tobacco use disorder  Chronic pain syndrome  Critically ill 75 year old female with multi organ failure at high risk for cardiac arrest, death.  Currently admitted for aspiration pneumonia,  respiratory failure, recent fall with fracture, failure to thrive, decreased oral intake, acute kidney injury, and others as described above.  There is understandable concern that she may not survive her hospitalization.  Currently she is on ventilator support.  Not requiring pressors at this time.  However, family is not clear on the gravity of her illness.  She has been made a DNR and there are plans for a family meeting in 107 hours for further discussions/decisions. Despite allowing 24 to 48 hours for outcomes, it does not appear that the patient is improving.  In fact, it appears that she is having some decline.  Her heart rate is quite high despite medical treatment.  Prognosis appears quite grim. Family meeting today, family understands seriousness of her illness. Anticipate, based on their discussion, that they will likely proceed with comfort care (likely tomorrow). They have asked for time to discuss amongst themselves  [place] DNR Do not escalate care Maintain current treatments for now Time for family discussion this evening Anticipate shift to comfort care, possibly tomorrow Continued holistic support for patient and family PMT will continue to follow  Code Status: DNR  Prognosis: Hours - Days  Discharge Planning: Anticipated Hospital Death  Discussed with: Medical team, nursing tesm  Thank you for allowing [place] to participate in the care of Suzanne Avila PMT will continue to support holistically.  Time Total: 75 min  Visit consisted of counseling and education dealing with the complex and emotionally intense issues of symptom management and palliative care in the setting of serious and potentially life-threatening illness. Greater than 50%  of this time was spent counseling and coordinating care related to the above assessment and plan.  Walden Field, NP Palliative Medicine Team  Team Phone # [phone] (Nights/Weekends)  06/22/2021, 8:17 AM

## 2021-08-26 NOTE — Progress Notes (Signed)
GOALS OF CARE DISCUSSION  The Clinical status was relayed to family in detail. Husband and Son Updated and notified of patients medical condition. Patients  husband, Fayrene Fearing called and told ICU RN that he has decided to make patient comfortable at this time and would like the pt to be taken off the ventilator.  Fayrene Fearing states that he does not want to be present at any time, but would like to be frequently updated.    Patient remains unresponsive and will not open eyes to command.   Patient is having a weak cough and struggling to remove secretions.   Patient with increased WOB and using accessory muscles to breathe Explained to family course of therapy and the modalities    Patient with Progressive multiorgan failure with a very high probablity of a very minimal chance of meaningful recovery despite all aggressive and optimal medical therapy.    Family understands the situation.  They have consented and agreed to DNR/DNI and would like to proceed with Comfort care measures.  Family are satisfied with Plan of action and management. All questions answered  Additional CC time 35 mins   Almena Hokenson Santiago Glad, M.D.  [facility]  Medical Director [facility] Medical Director [facility] Cardio-Pulmonary Department

## 2021-08-27 LAB — CULTURE, RESPIRATORY W GRAM STAIN

## 2021-09-09 ENCOUNTER — Ambulatory Visit: Payer: Medicare Other | Admitting: Pulmonary Disease

## 2021-09-23 NOTE — Progress Notes (Addendum)
Family will contact the facility with funeral hom information tomorrow 11/4

## 2021-09-23 NOTE — Death Summary Note (Addendum)
DEATH SUMMARY   Patient Details  Name: Suzanne Avila MRN: [id number] DOB: Dec 10, 1945  Admission/Discharge Information   Admit Date:  26-Aug-2021  Date of Death: Date of Death: 2021/09/07  Time of Death: Time of Death: February 10, 2046  Length of Stay: February 13, 2023  Referring Physician: Gwyneth Sprout, FNP   Reason(s) for Hospitalization  Acute hypoxic respiratory failure and severe sepsis due to pneumonia  Diagnoses  Preliminary cause of death:  Secondary Diagnoses (including complications and co-morbidities):  Principal Problem:   Multifocal pneumonia Active Problems:   Chronic pain syndrome   Acid reflux   Essential hypertension   Hypercholesterolemia without hypertriglyceridemia   Tobacco use disorder   Cavitary lesion of lung   AF (paroxysmal atrial fibrillation) (HCC)   Femur fracture, right ([place])   AKI (acute kidney injury) ([place])   Depression   Acute hypoxemic respiratory failure (HCC)   Severe sepsis with septic shock (HCC)   Pressure injury of skin   Malnutrition of moderate degree   Acute metabolic acidosis   Brief Hospital Course (including significant findings, care, treatment, and services provided and events leading to death)  Suzanne Avila is a 75 y.o. year old female  with PMH of GERD, smoking, COPD, right upper lobe cavitary lesion, recent right femoral fracture with subsequent SNF admission, paroxysmal atrial fibrillation who presented to the ED on 08/26/2021 with c/o 10 days of diarrhea and constipation found to be in severe sepsis with septic shock secondary to presumed multifocal pneumonia vs intraabdominal source.  Patient was admitted to hospital service with working diagnosis of acute hypoxic respiratory failure and severe sepsis secondary to multifocal pneumonia vs intraabdominal source, AKI due to ATN, complete occlusion of the iliac arteries, intra and extrahepatic biliary ductal dilation, thickened endometrium worrisome for endometrial carcinoma. During the course of  her admission, patient became encephalopathic and transferred to the ICU and intubated on 10/24 due to encephalopathy and acute hypoxic respiratory failure with multifocal pneumonia, ongoing aspiration and metabolic acidosis. CT Abd/Pelvis 10/26 was obtained which showed Incidental finding of 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, Intra and extrahepatic biliary dilatation and Thickened endometrium worrisome for endometrial carcinoma with improved cavitary lung lesion.10/26 CT Head revealed no acute intracranial pathology. Small bilateral mastoid effusions. Pt successfully extubated  on 10/29 but unfortunately on 10/31 she developed worsening acute hypoxic respiratory failure failed Bipap requiring reintubation.  Cefepime and Vancomycin started due to multifocal pneumonia. Remained on the vent sedated with precedex and fentanyl with periods of awake, intermittently opening eyes but not following commands. Course complicated by rapid afib rate 150s refractory to rate control agents. On 11/3, palliative medicine was consulted for goals of care discussion, specifically CODE STATUS. Decision was made by family to make patient DNR and possible transition to comfort care.That same evening on 11/3, patients  husband, Jeneen Rinks called and told ICU RN that he has decided to make patient comfortable at this time and would like the pt to be taken off the ventilator. Jeneen Rinks states that he does not want to be present at any time, but would like to be frequently updated. Patient was terminally extubated on 11/3 at 5:51 pm and transitioned to full comfort care. Patient was pronounced dead on 08-Sep-2023 at [facility] and family notified of time of death.    Pertinent Labs and Studies  Significant Diagnostic Studies DG Abd 1 View  Result Date: 08/22/2021 CLINICAL DATA:  Repositioning of feeding tube. EXAM: ABDOMEN - 1 VIEW COMPARISON:  Same day. FINDINGS: Distal  portion of feeding tube is looped within the stomach,  with the distal tip in the proximal stomach. IMPRESSION: Distal portion of feeding tube is looped within the stomach, with distal tip in the proximal stomach. Electronically Signed   By: Lupita RaiderJames  Green Jr M.D.   On: 08/22/2021 12:51   DG Abd 1 View  Result Date: 08/22/2021 CLINICAL DATA:  Tube placement with abdominal pain EXAM: ABDOMEN - 1 VIEW COMPARISON:  Four days ago FINDINGS: Feeding tube with tip at the stomach. Few loops of gas distended bowel. Where seen, overall nonobstructive bowel gas pattern. No concerning mass effect or calcification. Severe lumbar spine degeneration with L4-S1 fusion. IMPRESSION: Feeding tube with tip over the stomach. Electronically Signed   By: Tiburcio PeaJonathan  Watts M.D.   On: 08/22/2021 11:29   DG Abd 1 View  Result Date: 08/18/2021 CLINICAL DATA:  Feeding tube placement EXAM: ABDOMEN - 1 VIEW COMPARISON:  08/18/2021 FINDINGS: Esophageal tube has been advanced, the tip and side port now project over the stomach, there is a curve in the distal portion of the catheter. Persistent gaseous dilatation of central small bowel up to 4.3 cm. Probable pill fragments in the right lower quadrant IMPRESSION: Esophageal tube tip and side-port overlie the proximal stomach, possible partial kink in the distal portion of the tube Electronically Signed   By: Jasmine PangKim  Fujinaga M.D.   On: 08/18/2021 19:42   DG Abd 1 View  Result Date: 08/18/2021 CLINICAL DATA:  Advancement of feeding tube EXAM: ABDOMEN - 1 VIEW COMPARISON:  08/18/2021 FINDINGS: Esophageal tube tip remains at the region of GE junction with side port at the level of distal esophagus. The appearance does not appear significantly changed. Slight decreased gaseous dilatation of the bowel. IMPRESSION: Esophageal tube tip over GE junction, side-port at the level of distal esophagus, consider further advancement for more optimal positioning. These results will be called to the ordering clinician or representative by the Radiologist  Assistant, and communication documented in the PACS or [employer]. Electronically Signed   By: Jasmine PangKim  Fujinaga M.D.   On: 08/18/2021 15:55   DG Abd 1 View  Result Date: 08/18/2021 CLINICAL DATA:  Feeding tube placement EXAM: ABDOMEN - 1 VIEW COMPARISON:  08/16/2021 FINDINGS: Esophageal tube tip overlies GE junction, side-port over the distal esophagus. Persistent dilatation of bowel in the central abdomen. Compression deformity at T11. IMPRESSION: Esophageal tube side-port over distal esophagus, tip over GE junction, suggest further advancement for more optimal positioning Electronically Signed   By: Jasmine PangKim  Fujinaga M.D.   On: 08/18/2021 15:53   DG Abd 1 View  Result Date: 08/16/2021 CLINICAL DATA:  Encounter for OG tube placement EXAM: ABDOMEN - 1 VIEW COMPARISON:  CT 08/15/2009 FINDINGS: NG tube with tip just beyond the GE junction. Side port is above the GE junction. Persistent dilated loops of small bowel centrally in the abdomen measuring up to 3.8 cm. Small a gas the rectum. IMPRESSION: 1. NG tube with tip just beyond the GE junction. 2. Persistent dilated loops of small bowel centrally. Electronically Signed   By: Genevive BiStewart  Edmunds M.D.   On: 08/16/2021 15:35   CT HEAD WO CONTRAST (5MM)  Result Date: 08/18/2021 CLINICAL DATA:  Delirium EXAM: CT HEAD WITHOUT CONTRAST TECHNIQUE: Contiguous axial images were obtained from the base of the skull through the vertex without intravenous contrast. COMPARISON:  Maxillofacial CT 11/07/2007 FINDINGS: Brain: There is no acute intracranial hemorrhage, acute extra-axial fluid collection, or acute infarct. There is mild parenchymal volume loss. The  ventricles are not enlarged. There is no mass lesion. There is slight rightward shift of the ventricles most likely due to patient position within the scanner. Vascular: There is calcification of the bilateral cavernous ICAs. Skull: Normal. Negative for fracture or focal lesion. Sinuses/Orbits: The imaged paranasal  sinuses are clear. Bilateral lens implants are in place. The globes and orbits are otherwise unremarkable. Other: There is trace fluid in the mastoid air cells bilaterally. The imaged nasopharynx is unremarkable. IMPRESSION: 1. No acute intracranial pathology. 2. Small bilateral mastoid effusions. Electronically Signed   By: Valetta Mole M.D.   On: 08/18/2021 14:47   US RENAL  Result Date: 08/16/2021 CLINICAL DATA:  Renal failure EXAM: RENAL / URINARY TRACT ULTRASOUND COMPLETE COMPARISON:  CT 08/04/2021 FINDINGS: Right Kidney: Renal measurements: 5.8 x 2.2 x 2.5 cm = volume: 16.3 mL. Atrophic RIGHT kidney. No hydronephrosis increased cortical echogenicity Left Kidney: Renal measurements: 10.0 x 5.4 x 5.8 cm = volume: 160 mL. Echogenicity within normal limits. No mass or hydronephrosis visualized. Bladder: Foley catheter in bladder Other: Intubated patient.  Exam performed in ICU IMPRESSION: 1. Atrophic echogenic RIGHT kidney. 2. Normal LEFT kidney. 3. No hydronephrosis. 4. Foley catheter in bladder. Electronically Signed   By: Suzy Bouchard M.D.   On: 08/16/2021 17:57   CT CHEST ABDOMEN PELVIS W CONTRAST  Result Date: 08/18/2021 CLINICAL DATA:  Sepsis. EXAM: CT CHEST, ABDOMEN, AND PELVIS WITH CONTRAST TECHNIQUE: Multidetector CT imaging of the chest, abdomen and pelvis was performed following the standard protocol during bolus administration of intravenous contrast. CONTRAST:  34mL OMNIPAQUE IOHEXOL 300 MG/ML  SOLN COMPARISON:  CT scan 08/19/2021 FINDINGS: CT CHEST FINDINGS Cardiovascular: The heart is within normal limits in size and stable. No pericardial effusion. Stable atherosclerotic calcifications involving the aorta and coronary arteries. No aortic dissection or focal aneurysm. The pulmonary arteries are grossly normal. Mediastinum/Nodes: Stable scattered mediastinal and hilar lymph nodes likely reactive/inflammatory, given the lung findings. There is an NG tube coursing down the esophagus and  into the stomach. Lungs/Pleura: Persistent and progressive patchy multifocal pneumonia. There are small bilateral pleural effusions with overlying atelectasis. No pneumothorax or obvious lung mass. Musculoskeletal: No significant bony findings. Diffuse body wall edema is noted. CT ABDOMEN PELVIS FINDINGS Hepatobiliary: Intra and extrahepatic biliary dilatation appears new or at least more obvious with contrast. The common bile duct measures a maximum of 12 mm in the porta hepatis it does taper in the head of the pancreas and I do not see an obvious pancreatic head mass or ampullary lesion. Suspect small gallstones in the gallbladder. No obvious common bile duct stones. Recommend correlation with liver function studies. MRCP may be helpful for further evaluation if the patient can hold her breath. Pancreas: No mass, inflammation or ductal dilatation. Spleen: Normal size.  No focal lesions. Adrenals/Urinary Tract: Adrenal glands are unremarkable. Small right kidney with marked renal cortical atrophy likely due to chronic renal artery stenosis. The left kidney demonstrates mild compensatory hypertrophy. The bladder is decompressed by a Foley catheter. Stomach/Bowel: Suspect inflammatory changes involving the stomach particularly in the body/antral region. No obvious ulcer. The duodenum is grossly normal. There are dilated loops of small bowel which are fluid-filled. Other small bowel loops are normal in caliber. There is also scattered fluid in the colon which is also intermittently dilated. Areas of moderate mucosal enhancement are noted involving the right colon. Vascular/Lymphatic: Severe atherosclerotic calcifications involving the aorta and branch vessels but no aneurysm. I suspect there is complete or near complete  occlusion of the iliac arteries bilaterally. Scattered mesenteric and retroperitoneal lymph nodes but no mass or overt adenopathy. Reproductive: The endometrium is abnormally dilated (16 mm) and there  is moderate enhancement of the myometrium. Findings worrisome for endometrial carcinoma. Recommend follow-up pelvic ultrasound examination and consideration for endometrial sampling. Other: No free air or significant free fluid collections. No intra-abdominal/intrapelvic abscess is identified. Diffuse and marked body wall edema noted. Musculoskeletal: No acute bony findings. Remote thoracic spine compression fractures at T4 and T11. Lumbar fusion hardware noted. IMPRESSION: 1. Persistent and markedly progressive patchy multifocal pneumonia. 2. Small bilateral pleural effusions with overlying atelectasis. 3. Intra and extrahepatic biliary dilatation appears new or at least more obvious with contrast. No obvious common bile duct stones or pancreatic head mass. Recommend correlation with liver function studies. MRCP may be helpful for further evaluation if the patient can hold her breath. 4. Suspect inflammatory changes involving the bowel as detailed above. No obstruction. 1 5. Abnormally thickened endometrium worrisome for endometrial carcinoma. Recommend follow-up pelvic ultrasound examination and consideration for endometrial sampling. 6. Severe atherosclerotic calcifications involving the aorta and branch vessels including the coronary arteries. 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 7. Small right kidney with marked renal cortical atrophy likely due to chronic renal artery stenosis. Electronically Signed   By: Marijo Sanes M.D.   On: 08/18/2021 15:12   DG Chest Port 1 View  Result Date: 08/24/2021 CLINICAL DATA:  Respiratory failure EXAM: PORTABLE CHEST 1 VIEW COMPARISON:  Chest x-ray 08/23/2021 FINDINGS: Endotracheal tube tip is 0.6 cm above the carina. Enteric tube tip is in the stomach. Left PICC is stable. Cardiomediastinal silhouette is unchanged. Heart size is upper normal. Mild calcified plaques in the aortic arch. Persistent diffuse prominent interstitial lung  markings with patchy ground-glass airspace infiltrates, similar to previous study. Small bilateral pleural effusions. No pneumothorax. IMPRESSION: 1. Medical devices as described. Consider retracting the endotracheal tube 3 cm. 2. Otherwise no significant change since previous study. Bilateral pneumonia. Electronically Signed   By: Ofilia Neas M.D.   On: 08/24/2021 07:40   DG Chest Port 1 View  Result Date: 08/23/2021 CLINICAL DATA:  Respiratory distress. EXAM: PORTABLE CHEST 1 VIEW COMPARISON:  Chest radiograph dated 08/23/2021. FINDINGS: Endotracheal tube with tip at the level of the carina. Recommend retraction by approximately 4 cm for optimal positioning. Feeding tube extends below the diaphragm with tip in the epigastric area. Left subclavian central venous line in similar position. Interval progression of bilateral pulmonary opacities compared to the earlier radiograph. Small bilateral pleural effusions no pneumothorax. Stable cardiomegaly. No acute osseous pathology. IMPRESSION: 1. Endotracheal tube with tip at the level of the carina. Recommend retraction by 4 cm for optimal positioning. 2. Interval progression of bilateral pulmonary opacities compared to the earlier radiograph. Electronically Signed   By: Anner Crete M.D.   On: 08/23/2021 17:41   DG Chest Port 1 View  Result Date: 08/23/2021 CLINICAL DATA:  Acute respiratory failure with hypoxia (HCC) J96.01 (ICD-10-CM) EXAM: PORTABLE CHEST 1 VIEW COMPARISON:  August 20, 2021. FINDINGS: Interval extubation. Enteric tube courses below the diaphragm and is looped in the stomach. Left upper extremity PICC with the tip projecting at the upper right atrium. Increased patchy airspace opacities throughout both lungs with diffuse interstitial prominence. No visible pneumothorax. Probable small right and layering left pleural effusions. Similar enlarged cardiac silhouette. IMPRESSION: 1. Increased patchy airspace opacities throughout both  lungs, concerning for multifocal pneumonia. 2. Diffuse interstitial prominence,  which could be secondary to infection and/or superimposed edema. 3. Cardiomegaly and left larger than right pleural effusions. Electronically Signed   By: Feliberto Harts M.D.   On: 08/23/2021 08:27   DG Chest Port 1 View  Result Date: 08/20/2021 CLINICAL DATA:  Pneumonia EXAM: PORTABLE CHEST 1 VIEW COMPARISON:  Chest x-ray dated August 19, 2021 FINDINGS: Interval advancement of ET tube which is positioned approximately 3.0 cm from the carina. Unchanged position of left arm central venous line. Enteric tube tip is in the stomach. Cardiac and mediastinal contours are unchanged. Unchanged patchy bilateral opacities. No large pleural effusion or evidence pneumothorax. IMPRESSION: Interval advancement of ET tube which is positioned approximately 3.0 cm from the carina. Electronically Signed   By: Allegra Lai M.D.   On: 08/20/2021 10:03   DG Chest Port 1 View  Addendum Date: 08/19/2021   ADDENDUM REPORT: 08/19/2021 16:00 ADDENDUM: Left upper extremity PICC may have been minimally retracted in comparison the prior examination and is in appropriate position near the superior cavoatrial junction. Electronically Signed   By: Jearld Lesch M.D.   On: 08/19/2021 16:00   Result Date: 08/19/2021 CLINICAL DATA:  Central line placement EXAM: PORTABLE CHEST 1 VIEW COMPARISON:  08/19/2021 FINDINGS: No significant change in AP portable chest radiograph. Endotracheal tube remains positioned just below the thoracic inlet, approximately 6 cm above the carina. Other support apparatus unchanged. Cardiomegaly with diffuse bilateral interstitial and heterogeneous airspace opacity. IMPRESSION: 1. No significant change in AP portable chest radiograph. Endotracheal tube remains positioned just below the thoracic inlet, approximately 6 cm above the carina. Other support apparatus unchanged. 2. Cardiomegaly with diffuse bilateral interstitial and  heterogeneous airspace opacity, which may reflect edema or multifocal infection. Electronically Signed: By: Jearld Lesch M.D. On: 08/19/2021 15:00   DG Chest Port 1 View  Result Date: 08/19/2021 CLINICAL DATA:  PICC line placement EXAM: PORTABLE CHEST 1 VIEW COMPARISON:  08/16/2021 FINDINGS: Endotracheal tube has been withdrawn and is now 5 cm from carina in good position. Introduction of LEFT central venous line with tip at the cavoatrial junction versus RIGHT atrium. No pneumothorax. Persistent bilateral patchy airspace disease. IMPRESSION: 1. Central venous line with tip at the cavoatrial junction versus RIGHT atrium. No pneumothorax. 2. Endotracheal tube and NG tube in good position. 3. Diffuse bilateral airspace disease again noted. Electronically Signed   By: Genevive Bi M.D.   On: 08/19/2021 14:23   DG Chest Port 1 View  Result Date: 08/16/2021 CLINICAL DATA:  Endotracheal tube present Z97.8 (ICD-10-CM) EXAM: PORTABLE CHEST 1 VIEW COMPARISON:  08/19/2021. FINDINGS: Increased patchy interstitial and airspace opacities throughout both lungs. Endotracheal tube tip approximately 8 mm above the carina. Gastric tube courses below diaphragm with the tip likely in the stomach. Side port is likely above the GE junction. Similar enlargement the cardiac silhouette. No visible pleural effusions or pneumothorax on this semi erect radiograph. Please see concurrent abdominal radiographs for abdominal evaluation. IMPRESSION: 1. Endotracheal tube tip approximately 8 mm above the carina. Recommend retraction. 2. Gastric tube courses below the diaphragm with the tip likely in the stomach. The side port is above the GE junction. If intragastric positioning of the side port is desired, recommend advancement. 3. Increased patchy interstitial and airspace opacities throughout both lungs, concerning for multifocal pneumonia better evaluated on recent CT chest. 4. Please see concurrent abdominal radiographs for  abdominal evaluation. These results will be called to the ordering clinician or representative by the Radiologist Assistant, and communication documented  in the PACS or [employer]. Electronically Signed   By: Margaretha Sheffield M.D.   On: 08/16/2021 15:42   DG Chest Portable 1 View  Result Date: 08/11/2021 CLINICAL DATA:  Breath and hypoxia. EXAM: PORTABLE CHEST 1 VIEW COMPARISON:  07/21/2021 FINDINGS: Heart size is normal. There are increased patchy infiltrates in the LOWER lobes bilaterally and the RIGHT lung apex. Findings are consistent with infectious/inflammatory process. Partially imaged small bowel loops show mildly distended loops in the UPPER abdomen. No evidence for free intraperitoneal air. IMPRESSION: 1. Multifocal pulmonary infiltrates. 2. Question of small bowel dilatation. Consider two-view abdomen if needed. Electronically Signed   By: Nolon Nations M.D.   On: 07/26/2021 13:40   ECHOCARDIOGRAM COMPLETE  Result Date: 08/20/2021    ECHOCARDIOGRAM REPORT   Patient Name:   Suzanne Avila Date of Exam: 08/20/2021 Medical Rec #:  [id number]        Height:       56.0 in Accession #:    [id number]       Weight:       105.6 lb Date of Birth:  May 14, 1946        BSA:          1.353 m Patient Age:    75 years         BP:           173/104 mmHg Patient Gender: F                HR:           105 bpm. Exam Location:  [facility] Procedure: 2D Echo, Cardiac Doppler and Color Doppler Indications:     Abnormal ECG R94.31  History:         Patient has prior history of Echocardiogram examinations, most                  recent 09/15/2020. COPD; Risk Factors:Hypertension and                  Dyslipidemia.  Sonographer:     Sherrie Sport Referring Phys:  [id number] [place] E GRAVES Diagnosing Phys: Serafina Royals MD  Sonographer Comments: Suboptimal apical window and echo performed with patient supine and on artificial respirator. IMPRESSIONS  1. Left ventricular ejection fraction, by estimation, is 55 to 60%. The left  ventricle has normal function. The left ventricle has no regional wall motion abnormalities. Left ventricular diastolic parameters were normal.  2. Right ventricular systolic function is normal. The right ventricular size is normal.  3. Left atrial size was mildly dilated.  4. The mitral valve is normal in structure. Moderate mitral valve regurgitation. Moderate mitral annular calcification.  5. Tricuspid valve regurgitation is mild to moderate.  6. The aortic valve is normal in structure. Aortic valve regurgitation is trivial. FINDINGS  Left Ventricle: Left ventricular ejection fraction, by estimation, is 55 to 60%. The left ventricle has normal function. The left ventricle has no regional wall motion abnormalities. The left ventricular internal cavity size was normal in size. There is  no left ventricular hypertrophy. Left ventricular diastolic parameters were normal. Right Ventricle: The right ventricular size is normal. No increase in right ventricular wall thickness. Right ventricular systolic function is normal. Left Atrium: Left atrial size was mildly dilated. Right Atrium: Right atrial size was normal in size. Pericardium: There is no evidence of pericardial effusion. Mitral Valve: The mitral valve is normal in structure. Moderate mitral annular calcification. Moderate mitral valve regurgitation. MV peak  gradient, 9.1 mmHg. The mean mitral valve gradient is 3.0 mmHg. Tricuspid Valve: The tricuspid valve is normal in structure. Tricuspid valve regurgitation is mild to moderate. Aortic Valve: The aortic valve is normal in structure. Aortic valve regurgitation is trivial. Aortic valve mean gradient measures 4.0 mmHg. Aortic valve peak gradient measures 6.5 mmHg. Aortic valve area, by VTI measures 1.43 cm. Pulmonic Valve: The pulmonic valve was normal in structure. Pulmonic valve regurgitation is trivial. Aorta: The aortic root and ascending aorta are structurally normal, with no evidence of dilitation.  IAS/Shunts: No atrial level shunt detected by color flow Doppler.  LEFT VENTRICLE PLAX 2D LVIDd:         4.20 cm   Diastology LVIDs:         2.80 cm   LV e' medial:    3.70 cm/s LV PW:         1.10 cm   LV E/e' medial:  29.2 LV IVS:        0.85 cm   LV e' lateral:   5.11 cm/s LVOT diam:     2.00 cm   LV E/e' lateral: 21.1 LV SV:         34 LV SV Index:   25 LVOT Area:     3.14 cm  RIGHT VENTRICLE RV Basal diam:  3.60 cm RV S prime:     13.40 cm/s TAPSE (M-mode): 3.2 cm LEFT ATRIUM           Index        RIGHT ATRIUM           Index LA diam:      3.50 cm 2.59 cm/m   RA Area:     17.10 cm LA Vol (A4C): 35.3 ml 26.09 ml/m  RA Volume:   46.30 ml  34.22 ml/m  AORTIC VALVE                    PULMONIC VALVE AV Area (Vmax):    1.48 cm     PV Vmax:        0.84 m/s AV Area (Vmean):   1.31 cm     PV Vmean:       56.350 cm/s AV Area (VTI):     1.43 cm     PV VTI:         0.169 m AV Vmax:           127.50 cm/s  PV Peak grad:   2.8 mmHg AV Vmean:          86.400 cm/s  PV Mean grad:   1.5 mmHg AV VTI:            0.239 m      RVOT Peak grad: 4 mmHg AV Peak Grad:      6.5 mmHg AV Mean Grad:      4.0 mmHg LVOT Vmax:         60.10 cm/s LVOT Vmean:        35.900 cm/s LVOT VTI:          0.109 m LVOT/AV VTI ratio: 0.46  AORTA Ao Root diam: 2.40 cm MITRAL VALVE                TRICUSPID VALVE MV Area (PHT): 3.15 cm     TR Peak grad:   46.0 mmHg MV Area VTI:   0.89 cm     TR Vmax:        339.00 cm/s MV  Peak grad:  9.1 mmHg MV Mean grad:  3.0 mmHg     SHUNTS MV Vmax:       1.51 m/s     Systemic VTI:  0.11 m MV Vmean:      72.4 cm/s    Systemic Diam: 2.00 cm MV Decel Time: 241 msec     Pulmonic VTI:  0.202 m MV E velocity: 108.00 cm/s MV A velocity: 100.00 cm/s MV E/A ratio:  1.08 Arnoldo Hooker MD Electronically signed by Arnoldo Hooker MD Signature Date/Time: 08/20/2021/12:32:53 PM    Final    [place] EKG SITE RITE  Result Date: 08/19/2021 If Site Rite image not attached, placement could not be confirmed due to current cardiac  rhythm.  CT CHEST ABDOMEN PELVIS WO CONTRAST  Result Date: 08/22/2021 CLINICAL DATA:  Nonlocalized abdominal pain. Multifocal pneumonia on chest x-ray. EXAM: CT CHEST, ABDOMEN AND PELVIS WITHOUT CONTRAST TECHNIQUE: Multidetector CT imaging of the chest, abdomen and pelvis was performed following the standard protocol without IV contrast. COMPARISON:  Chest x-ray 08/06/2021, CT lumbar spine 06/15/2021, CT chest 12/22/2020, CT chest 07/19/2019 FINDINGS: CT CHEST FINDINGS Cardiovascular: Normal heart size. No significant pericardial effusion. The thoracic aorta is normal in caliber. Severe atherosclerotic plaque of the thoracic aorta. Three-vessel coronary artery calcifications. Mediastinum/Nodes: No enlarged mediastinal, hilar, or axillary lymph nodes. Thyroid gland, trachea, and esophagus demonstrate no significant findings. Lungs/Pleura: Interval further decrease of a 0.9 x 0.6 cm (from 1.7 x 2.2 cm) right upper lobe cavitary lesion (4:43). Associated traction bronchiectasis. Interval development of scattered peribronchovascular patchy nodular-like consolidations and centrilobular nodules. No pleural effusion. No pneumothorax. Musculoskeletal: No chest wall abnormality. No suspicious lytic or blastic osseous lesions. No acute displaced fracture. Multilevel degenerative changes of the spine. CT ABDOMEN PELVIS FINDINGS Hepatobiliary: No focal liver abnormality. No definite gallstones, gallbladder wall thickening, or pericholecystic fluid. No biliary dilatation. Pancreas: No focal lesion. Normal pancreatic contour. No surrounding inflammatory changes. No main pancreatic ductal dilatation. Spleen: Normal in size without focal abnormality. Adrenals/Urinary Tract: No adrenal nodule bilaterally. Right renal atrophy. Left renal calcification likely vascular. No nephrolithiasis and no hydronephrosis. No definite contour-deforming renal mass. No ureterolithiasis or hydroureter. The urinary bladder is unremarkable.  Stomach/Bowel: Stomach is within normal limits. The small bowel is distended with free fluid. No pneumatosis. No evidence of bowel wall thickening or dilatation. Appendix appears normal. Vascular/Lymphatic: No abdominal aorta or iliac aneurysm. Severe atherosclerotic plaque of the aorta and its branches. No abdominal, pelvic, or inguinal lymphadenopathy. Reproductive: Uterus and bilateral adnexa are unremarkable. Other: No intraperitoneal free fluid. No intraperitoneal free gas. No organized fluid collection. Musculoskeletal: No abdominal wall hernia or abnormality.  Diastasis rectus. No suspicious lytic or blastic osseous lesions. Age-indeterminate compression fracture of the T4 vertebral body with greater than 80% height loss. Chronic, slightly worsened, T11 compression fracture with greater than 80% height loss. Multilevel degenerative changes of the spine with endplate sclerosis at the L2-L3 and L3-L4 level, multilevel intervertebral disc space vacuum phenomenon, diffusely decreased bone density. Status post L4-S1 posterolateral fusion. IMPRESSION: 1. Multifocal pneumonia, likely atypical. Non-contrast chest CT at 3-6 months is recommended. If the nodules are stable at time of repeat CT, then future CT at 18-24 months (from today's scan) is considered optional for low-risk patients, but is recommended for high-risk patients. This recommendation follows the consensus statement: Guidelines for Management of Incidental Pulmonary Nodules Detected on CT Images: From the Fleischner Society 2017; Radiology 2017; 284:228-243. 2. Interval further decrease of a 0.9 x 0.6 cm (from  1.7 x 2.2 cm) right upper lobe cavitary lesion that likely represents a resolving infection. 3. Nonspecific diffuse distension of the small bowel with fluid. Limited evaluation on this noncontrast study. 4. Age-indeterminate compression fracture of the T4 vertebral body with greater than 80% height loss. Chronic, slightly worsened, T11  compression fracture with greater than 80% height loss. Diffusely decreased bone density in a multilevel degenerative changes status post L4-S1 posterolateral fusion. 5.  Aortic Atherosclerosis (ICD10-I70.0). 6. Atrophic right kidney. Electronically Signed   By: Iven Finn M.D.   On: 08/11/2021 15:41   US Abdomen Limited RUQ (LIVER/GB)  Result Date: 08/22/2021 CLINICAL DATA:  75 year old female with biliary dilatation. EXAM: ULTRASOUND ABDOMEN LIMITED RIGHT UPPER QUADRANT COMPARISON:  08/18/2021 CT and prior studies FINDINGS: Gallbladder: Mobile gallstones are identified, the largest measuring 6 mm. There is borderline gallbladder wall thickening which measures 3 mm. No sonographic Murphy sign identified. Common bile duct: Diameter: 7.6 mm. No cholelithiasis or definite obstructing cause but the LOWER CBD is not well visualized. Liver: No focal lesion identified. Within normal limits in parenchymal echogenicity. Portal vein is patent on color Doppler imaging with normal direction of blood flow towards the liver. Other: Atrophic RIGHT kidney is noted. A trace RIGHT pleural effusion is present. IMPRESSION: 1. Cholelithiasis with borderline gallbladder wall thickening. No sonographic Murphy sign. 2. CBD within normal limits of size on this study and without obstructing cause on this study. Please note the LOWER CBD is not well visualized. 3. Trace RIGHT pleural effusion. 4. Atrophic RIGHT kidney. Electronically Signed   By: Margarette Canada M.D.   On: 08/22/2021 10:16    Microbiology Recent Results (from the past 240 hour(s))  Culture, Respiratory w Gram Stain     Status: None   Collection Time: 08/17/21  8:08 AM   Specimen: Tracheal Aspirate; Respiratory  Result Value Ref Range Status   Specimen Description   Final    TRACHEAL ASPIRATE Performed at [facility], [address], [place], [place] [address]    Special Requests   Final    NONE Performed at [facility], [address] [place], [place] [address]    Gram Stain   Final    ABUNDANT WBC PRESENT, PREDOMINANTLY PMN FEW YEAST FEW GRAM POSITIVE RODS RARE GRAM NEGATIVE RODS    Culture   Final    FEW Normal respiratory flora-no Staph aureus or Pseudomonas seen Performed at [facility], [place] [address], [place], [place] [address]    Report Status 08/19/2021 FINAL  Final  Culture, Respiratory w Gram Stain     Status: None (Preliminary result)   Collection Time: 08/23/21  4:18 PM   Specimen: SPU  Result Value Ref Range Status   Specimen Description   Final    SPUTUM Performed at [facility], [address], [place], [place] [address]    Special Requests   Final    NONE Performed at [facility], [address] [place], [place] [address]    Gram Stain   Final    RARE WBC PRESENT,BOTH PMN AND MONONUCLEAR NO ORGANISMS SEEN    Culture   Final    RARE YEAST CULTURE REINCUBATED FOR BETTER GROWTH Performed at [facility], [place] [address] [place], [place] [address]    Report Status PENDING  Incomplete    Lab Basic Metabolic Panel: Recent Labs  Lab 08/21/21 0510 08/22/21 [id number] 08/22/21 2003 08/23/21 [id number] 08/24/21 0359 08/25/21 0448 08/26/21 0604  NA  138 141  --  143 143 142  --   K 3.2* 3.5 3.5 4.8 4.6 5.1  --   CL 108 109  --  112* 112* 113*  --   CO2 23 25  --  24 23 25   --   GLUCOSE 168* 91  --  206* 227* 271*  --   BUN 28* 25*  --  21 34* 41*  --   CREATININE 0.85 0.71  --  0.68 1.04* 0.95  --   CALCIUM 8.5* 8.2*  --  8.3* 8.1* 8.3*  --   MG 1.4* 1.9 1.6* 2.7* 2.1 1.9 1.9  PHOS 3.2 3.4 3.7 4.2 4.5 3.0 3.0   Liver Function Tests: No results for input(s): AST, ALT, ALKPHOS, BILITOT, PROT, ALBUMIN in the last 168 hours. No results for input(s): LIPASE, AMYLASE in the last 168 hours. No results for input(s): AMMONIA in the last 168 hours. CBC: Recent Labs  Lab 08/21/21 0510 08/22/21 0519 08/23/21 0438 08/24/21 0359 08/25/21 0448  WBC 10.3 9.8  12.6* 6.6 9.6  HGB 7.8* 7.2* 8.7* 7.5* 7.4*  HCT 22.4* 20.4* 25.8* 22.9* 24.6*  MCV 80.9 84.0 86.3 87.7 90.1  PLT 92* 102* 169 180 295   Cardiac Enzymes: No results for input(s): CKTOTAL, CKMB, CKMBINDEX, TROPONINI in the last 168 hours. Sepsis Labs: Recent Labs  Lab 08/20/21 1621 08/21/21 0510 08/22/21 0519 08/23/21 0438 08/24/21 0359 08/25/21 0448  PROCALCITON 7.38  --   --  1.08  --   --   WBC  --    < > 9.8 12.6* 6.6 9.6   < > = values in this interval not displayed.    Procedures/Operations   10/24: Intubation 10/31: Re intubation    Rufina Falco, DNP, CCRN, FNP-C, AGACNP-BC Acute Care Nurse Practitioner  [facility] Pager: [phone] [place] at [facility]

## 2021-09-23 NOTE — Progress Notes (Signed)
Patient transitioned at 35.Lanora Manis NP & family aware. Family will contact facility back with arrangements.

## 2021-09-23 DEATH — deceased

## 2021-11-05 ENCOUNTER — Encounter: Payer: Self-pay | Admitting: Pulmonary Disease

## 2021-11-12 IMAGING — RF DG ESOPHAGUS
7 of 8 series · 14 of 24 positions shown · non-contrast
Comparison: None.

CLINICAL DATA: Dysphagia, history of right upper lobe cavitary mass

EXAM:
ESOPHOGRAM/BARIUM SWALLOW
TECHNIQUE: Single contrast examination was performed using  thin barium.
FLUOROSCOPY TIME:  Fluoroscopy Time:  1 minutes 6 seconds
Radiation Exposure Index (if provided by the fluoroscopic device):
17.5 mGy
Number of Acquired Spot Images: Multiple cine fluoroscopic runs.

[Series 1: cp_standard · 0.18mm/px · 2 of 153 frames shown (1 of 3)]
[frame 23/153]
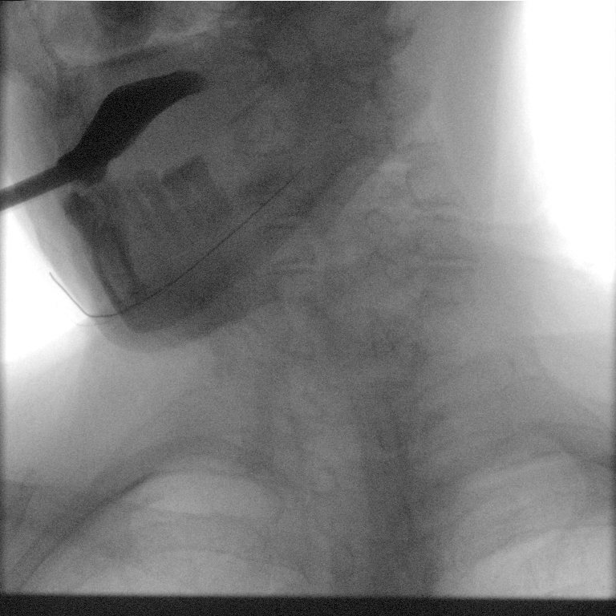
[frame 131/153]
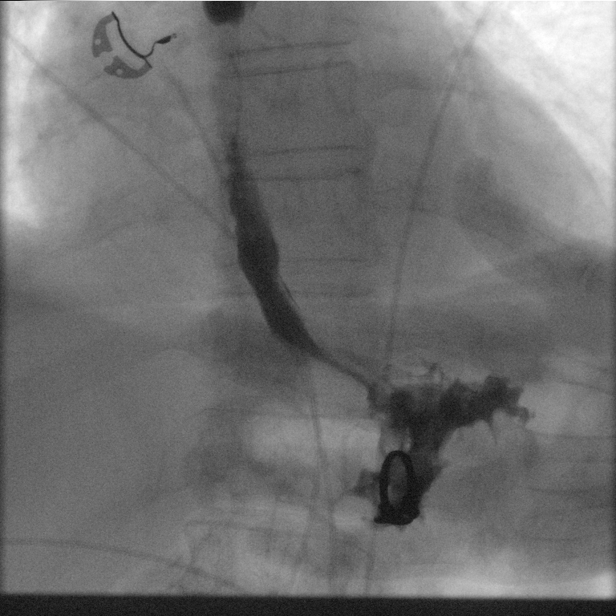

[Series 2: cp_standard · 0.18mm/px · 3 of 170 frames shown (2 of 3)]
[frame 26/170]
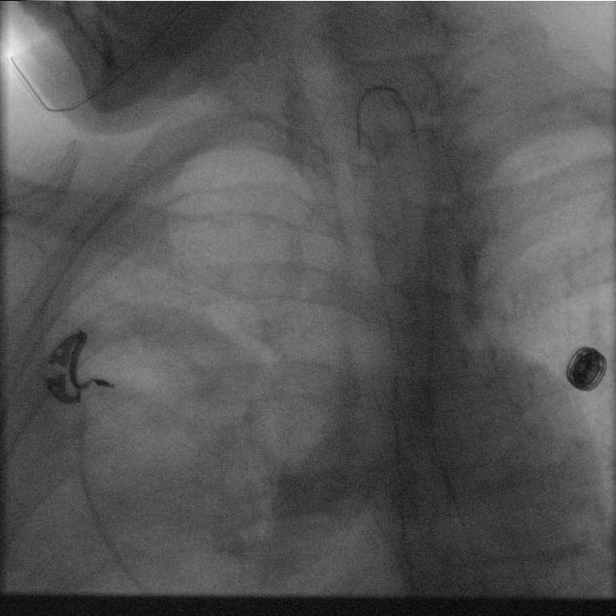
[frame 86/170]
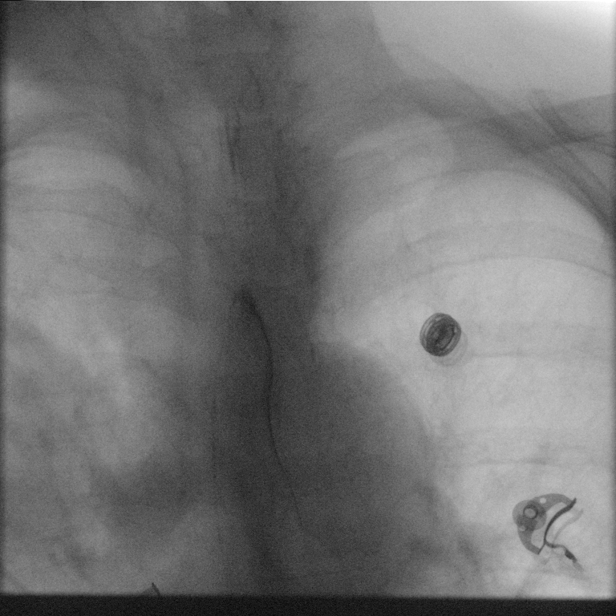
[frame 145/170]
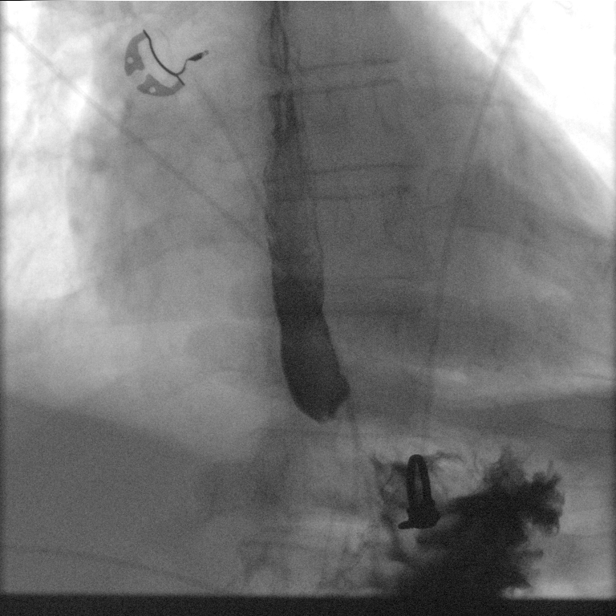

[Series 3: fluoro_barium 2fps_bw · 0.18mm/px · 1 of 2 frames shown (1 of 4)]
[frame 2/2]
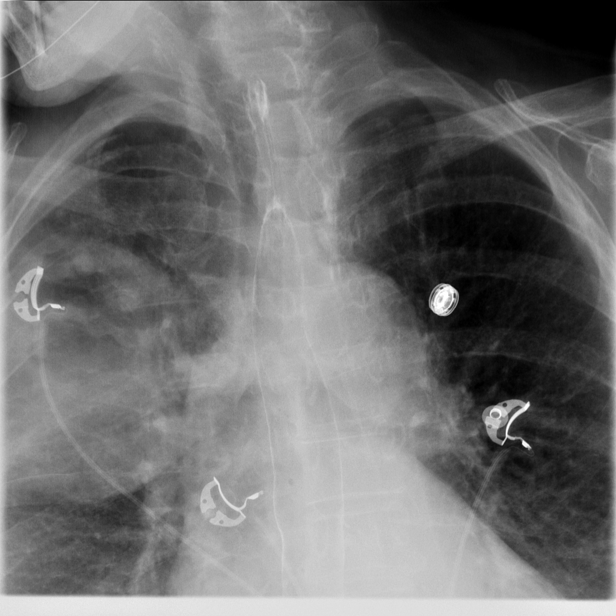

[Series 4: fluoro_barium 2fps_bw · 0.18mm/px · 1 of 4 frames shown (2 of 4)]
[frame 3/4]
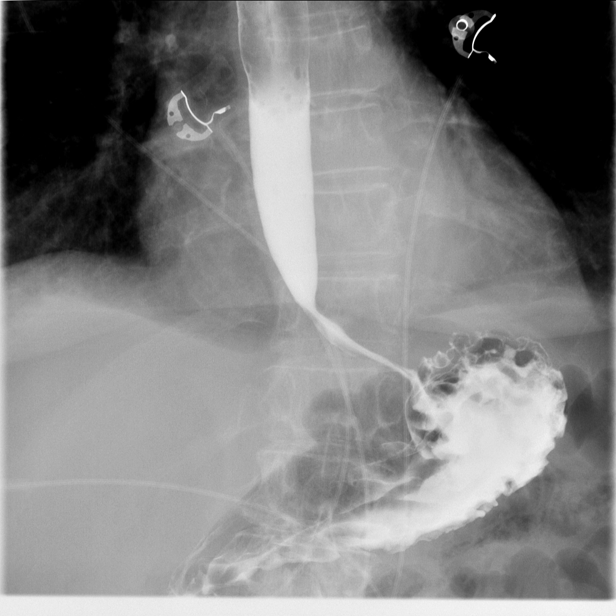

[Series 5: cp_standard · 0.18mm/px · 2 of 110 frames shown (3 of 3)]
[frame 17/110]
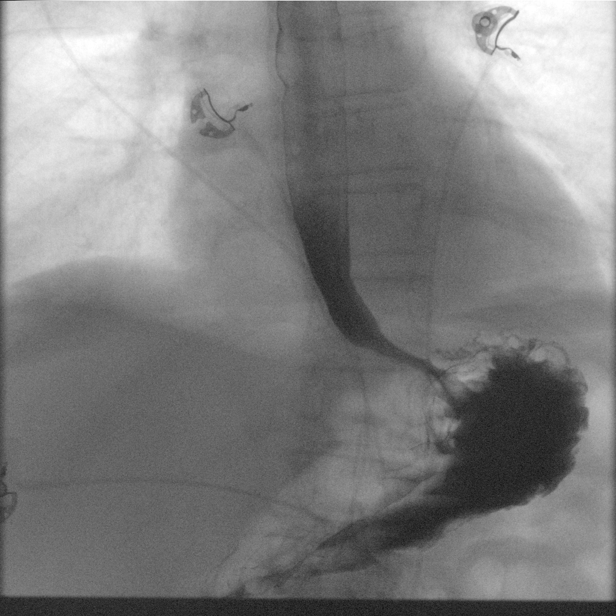
[frame 94/110]
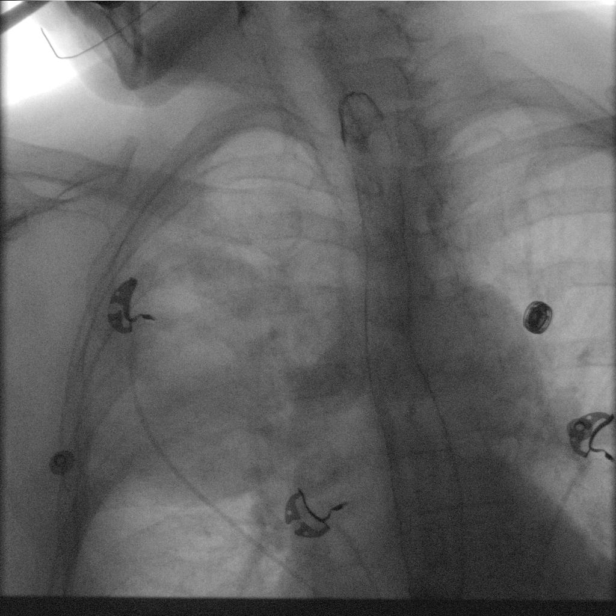

[Series 6: fluoro_barium 2fps_bw · 0.18mm/px · 3 of 6 frames shown (3 of 4)]
[frame 1/6]
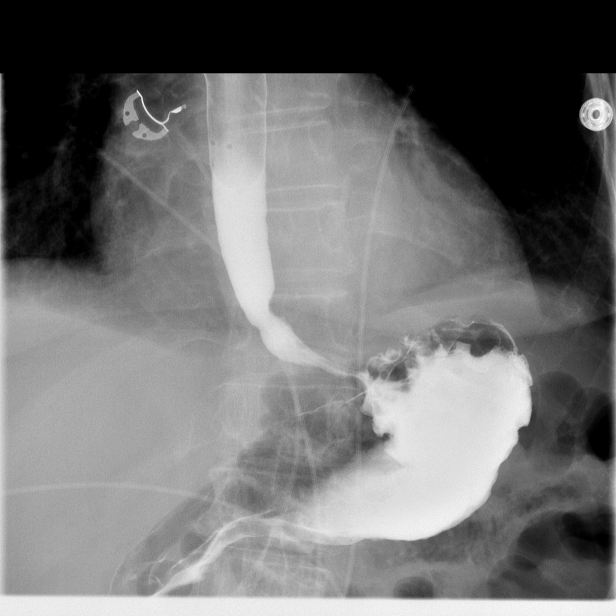
[frame 4/6]
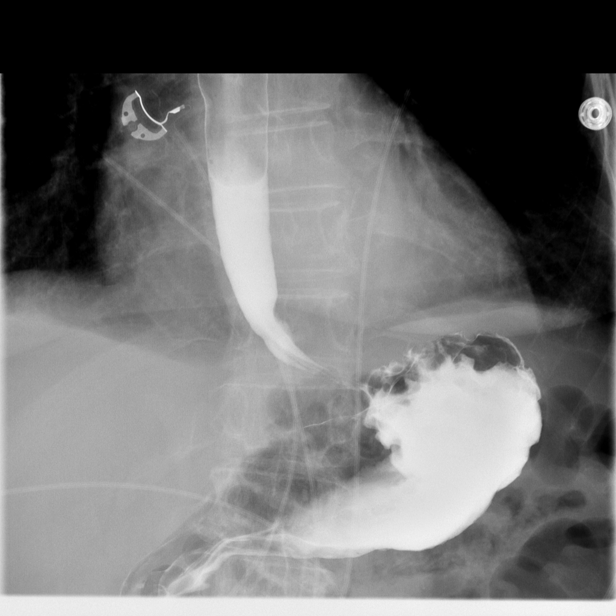
[frame 6/6]
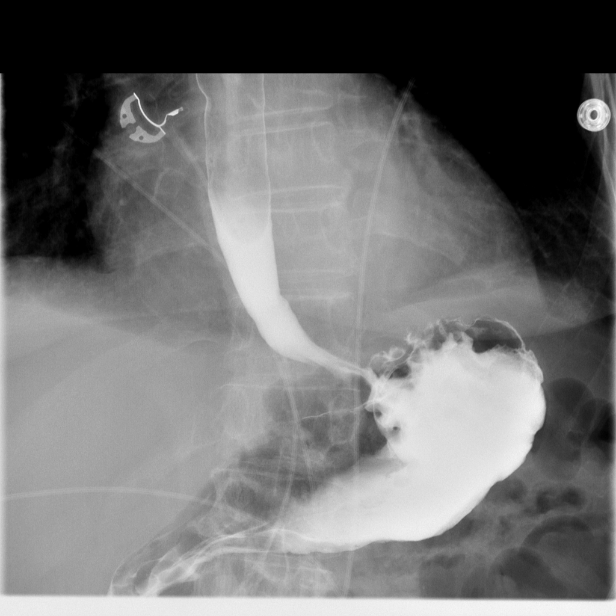

[Series 8: fluoro_barium 2fps_bw · 0.18mm/px · 2 of 10 frames shown (4 of 4)]
[frame 2/10]
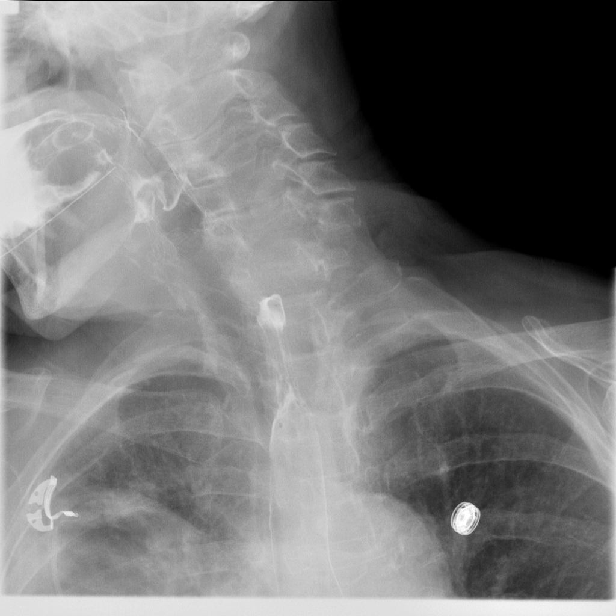
[frame 9/10]
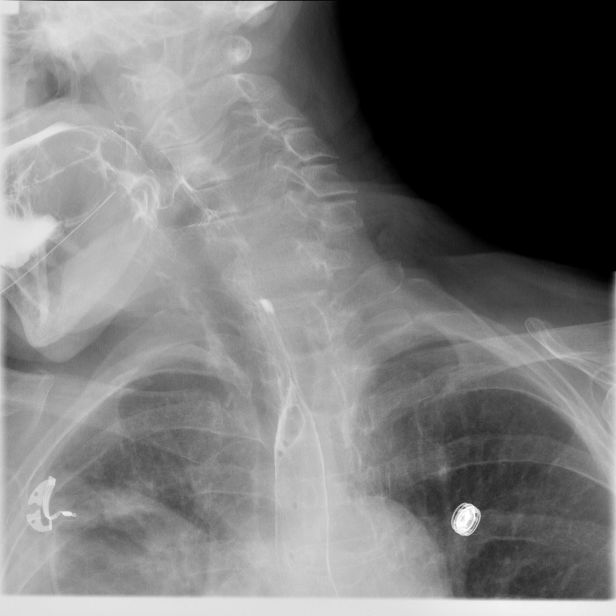

[14 of 24 positions shown; findings below may reference images not displayed]

FINDINGS: Swallowing mechanism and esophageal transit time are within normal
limits. No mucosal abnormality is noted. No obstructive changes are
seen. Small sliding-type hiatal hernia is noted which is variable.
No significant reflux is noted.

Note is made during fluoroscopy of the patient's known cavitary
right upper lobe mass.
IMPRESSION: No acute esophageal abnormality noted.

Transient sliding-type hiatal hernia.

Cavitary right upper lobe mass.
